# Patient Record
Sex: Male | Born: 1975
Health system: Southern US, Community
[De-identification: ages and names within clinical notes are randomized; demographics above are authoritative.]

## PROBLEM LIST (undated history)

## (undated) DIAGNOSIS — Z973 Presence of spectacles and contact lenses: Secondary | ICD-10-CM

## (undated) DIAGNOSIS — I82409 Acute embolism and thrombosis of unspecified deep veins of unspecified lower extremity: Secondary | ICD-10-CM

## (undated) DIAGNOSIS — I2699 Other pulmonary embolism without acute cor pulmonale: Secondary | ICD-10-CM

## (undated) DIAGNOSIS — I10 Essential (primary) hypertension: Secondary | ICD-10-CM

## (undated) HISTORY — PX: CHOLECYSTECTOMY: SHX55

## (undated) HISTORY — DX: Essential (primary) hypertension: I10

## (undated) HISTORY — DX: Presence of spectacles and contact lenses: Z97.3

---

## 1997-06-27 ENCOUNTER — Emergency Department (HOSPITAL_COMMUNITY): Admission: EM | Admit: 1997-06-27 | Discharge: 1997-06-27 | Payer: Self-pay | Admitting: Emergency Medicine

## 1999-07-13 ENCOUNTER — Emergency Department (HOSPITAL_COMMUNITY): Admission: EM | Admit: 1999-07-13 | Discharge: 1999-07-13 | Payer: Self-pay | Admitting: Internal Medicine

## 2000-01-02 ENCOUNTER — Emergency Department (HOSPITAL_COMMUNITY): Admission: EM | Admit: 2000-01-02 | Discharge: 2000-01-02 | Payer: Self-pay | Admitting: *Deleted

## 2001-03-12 DIAGNOSIS — I10 Essential (primary) hypertension: Secondary | ICD-10-CM

## 2001-03-12 HISTORY — DX: Essential (primary) hypertension: I10

## 2002-03-03 ENCOUNTER — Emergency Department (HOSPITAL_COMMUNITY): Admission: EM | Admit: 2002-03-03 | Discharge: 2002-03-03 | Payer: Self-pay | Admitting: Emergency Medicine

## 2002-03-05 ENCOUNTER — Emergency Department (HOSPITAL_COMMUNITY): Admission: EM | Admit: 2002-03-05 | Discharge: 2002-03-05 | Payer: Self-pay | Admitting: Emergency Medicine

## 2002-03-06 ENCOUNTER — Emergency Department (HOSPITAL_COMMUNITY): Admission: EM | Admit: 2002-03-06 | Discharge: 2002-03-06 | Payer: Self-pay | Admitting: Emergency Medicine

## 2002-03-07 ENCOUNTER — Encounter: Payer: Self-pay | Admitting: Emergency Medicine

## 2002-03-08 ENCOUNTER — Encounter: Payer: Self-pay | Admitting: *Deleted

## 2002-03-08 ENCOUNTER — Inpatient Hospital Stay (HOSPITAL_COMMUNITY): Admission: EM | Admit: 2002-03-08 | Discharge: 2002-03-23 | Payer: Self-pay | Admitting: Emergency Medicine

## 2002-03-10 ENCOUNTER — Encounter (INDEPENDENT_AMBULATORY_CARE_PROVIDER_SITE_OTHER): Payer: Self-pay | Admitting: *Deleted

## 2002-03-11 ENCOUNTER — Encounter: Payer: Self-pay | Admitting: Gastroenterology

## 2002-03-19 ENCOUNTER — Encounter: Payer: Self-pay | Admitting: Internal Medicine

## 2002-03-24 ENCOUNTER — Inpatient Hospital Stay (HOSPITAL_COMMUNITY): Admission: EM | Admit: 2002-03-24 | Discharge: 2002-03-30 | Payer: Self-pay | Admitting: Emergency Medicine

## 2002-03-24 ENCOUNTER — Encounter: Payer: Self-pay | Admitting: Internal Medicine

## 2002-03-26 ENCOUNTER — Encounter: Payer: Self-pay | Admitting: *Deleted

## 2002-04-04 ENCOUNTER — Inpatient Hospital Stay (HOSPITAL_COMMUNITY): Admission: EM | Admit: 2002-04-04 | Discharge: 2002-04-15 | Payer: Self-pay | Admitting: Emergency Medicine

## 2002-04-07 ENCOUNTER — Encounter: Payer: Self-pay | Admitting: *Deleted

## 2002-04-07 ENCOUNTER — Encounter: Payer: Self-pay | Admitting: Internal Medicine

## 2002-04-22 ENCOUNTER — Encounter (HOSPITAL_BASED_OUTPATIENT_CLINIC_OR_DEPARTMENT_OTHER): Admission: RE | Admit: 2002-04-22 | Discharge: 2002-07-21 | Payer: Self-pay | Admitting: Dermatology

## 2002-04-24 ENCOUNTER — Encounter: Payer: Self-pay | Admitting: Internal Medicine

## 2002-04-24 ENCOUNTER — Inpatient Hospital Stay (HOSPITAL_COMMUNITY): Admission: EM | Admit: 2002-04-24 | Discharge: 2002-04-25 | Payer: Self-pay | Admitting: Emergency Medicine

## 2002-07-24 ENCOUNTER — Encounter (HOSPITAL_BASED_OUTPATIENT_CLINIC_OR_DEPARTMENT_OTHER): Admission: RE | Admit: 2002-07-24 | Discharge: 2002-10-22 | Payer: Self-pay | Admitting: Dermatology

## 2002-10-26 ENCOUNTER — Encounter (HOSPITAL_BASED_OUTPATIENT_CLINIC_OR_DEPARTMENT_OTHER): Admission: RE | Admit: 2002-10-26 | Discharge: 2002-11-27 | Payer: Self-pay | Admitting: Dermatology

## 2002-12-01 ENCOUNTER — Emergency Department (HOSPITAL_COMMUNITY): Admission: EM | Admit: 2002-12-01 | Discharge: 2002-12-02 | Payer: Self-pay | Admitting: Emergency Medicine

## 2003-01-30 ENCOUNTER — Emergency Department (HOSPITAL_COMMUNITY): Admission: EM | Admit: 2003-01-30 | Discharge: 2003-01-30 | Payer: Self-pay | Admitting: Emergency Medicine

## 2003-08-13 ENCOUNTER — Emergency Department (HOSPITAL_COMMUNITY): Admission: EM | Admit: 2003-08-13 | Discharge: 2003-08-13 | Payer: Self-pay | Admitting: Emergency Medicine

## 2004-04-07 ENCOUNTER — Emergency Department (HOSPITAL_COMMUNITY): Admission: EM | Admit: 2004-04-07 | Discharge: 2004-04-07 | Payer: Self-pay | Admitting: Emergency Medicine

## 2004-04-16 ENCOUNTER — Emergency Department (HOSPITAL_COMMUNITY): Admission: EM | Admit: 2004-04-16 | Discharge: 2004-04-16 | Payer: Self-pay | Admitting: Emergency Medicine

## 2004-04-17 ENCOUNTER — Emergency Department (HOSPITAL_COMMUNITY): Admission: EM | Admit: 2004-04-17 | Discharge: 2004-04-17 | Payer: Self-pay | Admitting: Emergency Medicine

## 2004-06-05 ENCOUNTER — Emergency Department (HOSPITAL_COMMUNITY): Admission: EM | Admit: 2004-06-05 | Discharge: 2004-06-05 | Payer: Self-pay | Admitting: Emergency Medicine

## 2004-06-08 ENCOUNTER — Ambulatory Visit: Payer: Self-pay | Admitting: Cardiology

## 2004-06-08 ENCOUNTER — Observation Stay (HOSPITAL_COMMUNITY): Admission: EM | Admit: 2004-06-08 | Discharge: 2004-06-09 | Payer: Self-pay | Admitting: Emergency Medicine

## 2004-06-09 ENCOUNTER — Encounter: Payer: Self-pay | Admitting: Cardiology

## 2004-10-23 ENCOUNTER — Emergency Department (HOSPITAL_COMMUNITY): Admission: EM | Admit: 2004-10-23 | Discharge: 2004-10-24 | Payer: Self-pay | Admitting: Emergency Medicine

## 2004-10-24 ENCOUNTER — Ambulatory Visit (HOSPITAL_COMMUNITY): Admission: RE | Admit: 2004-10-24 | Discharge: 2004-10-24 | Payer: Self-pay | Admitting: Emergency Medicine

## 2004-10-25 ENCOUNTER — Ambulatory Visit: Payer: Self-pay | Admitting: Nurse Practitioner

## 2004-10-25 ENCOUNTER — Ambulatory Visit: Payer: Self-pay | Admitting: *Deleted

## 2004-11-22 ENCOUNTER — Ambulatory Visit: Payer: Self-pay | Admitting: Nurse Practitioner

## 2004-12-09 ENCOUNTER — Emergency Department (HOSPITAL_COMMUNITY): Admission: EM | Admit: 2004-12-09 | Discharge: 2004-12-09 | Payer: Self-pay | Admitting: Emergency Medicine

## 2005-01-01 ENCOUNTER — Emergency Department (HOSPITAL_COMMUNITY): Admission: EM | Admit: 2005-01-01 | Discharge: 2005-01-01 | Payer: Self-pay | Admitting: *Deleted

## 2005-01-02 ENCOUNTER — Emergency Department (HOSPITAL_COMMUNITY): Admission: EM | Admit: 2005-01-02 | Discharge: 2005-01-02 | Payer: Self-pay | Admitting: *Deleted

## 2005-07-25 ENCOUNTER — Emergency Department (HOSPITAL_COMMUNITY): Admission: EM | Admit: 2005-07-25 | Discharge: 2005-07-25 | Payer: Self-pay | Admitting: Emergency Medicine

## 2005-09-15 ENCOUNTER — Emergency Department (HOSPITAL_COMMUNITY): Admission: EM | Admit: 2005-09-15 | Discharge: 2005-09-15 | Payer: Self-pay | Admitting: Emergency Medicine

## 2005-12-12 ENCOUNTER — Emergency Department (HOSPITAL_COMMUNITY): Admission: EM | Admit: 2005-12-12 | Discharge: 2005-12-12 | Payer: Self-pay | Admitting: Emergency Medicine

## 2006-01-29 ENCOUNTER — Emergency Department (HOSPITAL_COMMUNITY): Admission: EM | Admit: 2006-01-29 | Discharge: 2006-01-29 | Payer: Self-pay | Admitting: Emergency Medicine

## 2006-08-06 ENCOUNTER — Ambulatory Visit: Payer: Self-pay | Admitting: Nurse Practitioner

## 2006-08-30 ENCOUNTER — Ambulatory Visit: Payer: Self-pay | Admitting: Internal Medicine

## 2006-09-16 ENCOUNTER — Ambulatory Visit: Payer: Self-pay | Admitting: Internal Medicine

## 2006-09-25 ENCOUNTER — Ambulatory Visit: Payer: Self-pay | Admitting: Internal Medicine

## 2006-10-09 ENCOUNTER — Emergency Department (HOSPITAL_COMMUNITY): Admission: EM | Admit: 2006-10-09 | Discharge: 2006-10-09 | Payer: Self-pay | Admitting: Emergency Medicine

## 2006-10-24 ENCOUNTER — Ambulatory Visit: Payer: Self-pay | Admitting: Family Medicine

## 2006-10-31 ENCOUNTER — Ambulatory Visit: Payer: Self-pay | Admitting: Internal Medicine

## 2006-11-19 ENCOUNTER — Ambulatory Visit: Payer: Self-pay | Admitting: Family Medicine

## 2006-11-27 ENCOUNTER — Encounter (INDEPENDENT_AMBULATORY_CARE_PROVIDER_SITE_OTHER): Payer: Self-pay | Admitting: *Deleted

## 2006-12-12 ENCOUNTER — Ambulatory Visit: Payer: Self-pay | Admitting: Internal Medicine

## 2007-01-02 ENCOUNTER — Ambulatory Visit: Payer: Self-pay | Admitting: Internal Medicine

## 2007-01-08 ENCOUNTER — Ambulatory Visit: Payer: Self-pay | Admitting: Family Medicine

## 2007-01-13 ENCOUNTER — Emergency Department (HOSPITAL_COMMUNITY): Admission: EM | Admit: 2007-01-13 | Discharge: 2007-01-13 | Payer: Self-pay | Admitting: Emergency Medicine

## 2007-01-22 ENCOUNTER — Ambulatory Visit: Payer: Self-pay | Admitting: Family Medicine

## 2007-02-04 ENCOUNTER — Emergency Department (HOSPITAL_COMMUNITY): Admission: EM | Admit: 2007-02-04 | Discharge: 2007-02-04 | Payer: Self-pay | Admitting: Emergency Medicine

## 2007-02-28 ENCOUNTER — Ambulatory Visit: Payer: Self-pay | Admitting: Family Medicine

## 2007-06-03 ENCOUNTER — Ambulatory Visit: Payer: Self-pay | Admitting: Internal Medicine

## 2007-06-11 ENCOUNTER — Ambulatory Visit (HOSPITAL_COMMUNITY): Admission: RE | Admit: 2007-06-11 | Discharge: 2007-06-11 | Payer: Self-pay | Admitting: Family Medicine

## 2007-07-04 ENCOUNTER — Ambulatory Visit: Payer: Self-pay | Admitting: Internal Medicine

## 2007-08-14 ENCOUNTER — Encounter (INDEPENDENT_AMBULATORY_CARE_PROVIDER_SITE_OTHER): Payer: Self-pay | Admitting: Family Medicine

## 2007-08-14 ENCOUNTER — Ambulatory Visit: Payer: Self-pay | Admitting: Internal Medicine

## 2007-08-14 LAB — CONVERTED CEMR LAB
Benzodiazepines.: NEGATIVE
Creatinine,U: 251.5 mg/dL
Methadone: NEGATIVE

## 2008-03-20 ENCOUNTER — Emergency Department (HOSPITAL_COMMUNITY): Admission: EM | Admit: 2008-03-20 | Discharge: 2008-03-20 | Payer: Self-pay | Admitting: Emergency Medicine

## 2008-05-05 ENCOUNTER — Ambulatory Visit: Payer: Self-pay | Admitting: Internal Medicine

## 2008-05-05 ENCOUNTER — Encounter (INDEPENDENT_AMBULATORY_CARE_PROVIDER_SITE_OTHER): Payer: Self-pay | Admitting: Internal Medicine

## 2008-05-05 LAB — CONVERTED CEMR LAB
Amphetamine Screen, Ur: NEGATIVE
Benzodiazepines.: NEGATIVE
Cocaine Metabolites: NEGATIVE
Creatinine,U: 145.5 mg/dL
Marijuana Metabolite: POSITIVE — AB
Propoxyphene: NEGATIVE

## 2008-05-12 ENCOUNTER — Emergency Department (HOSPITAL_COMMUNITY): Admission: EM | Admit: 2008-05-12 | Discharge: 2008-05-12 | Payer: Self-pay | Admitting: Emergency Medicine

## 2008-05-20 ENCOUNTER — Encounter (INDEPENDENT_AMBULATORY_CARE_PROVIDER_SITE_OTHER): Payer: Self-pay | Admitting: Internal Medicine

## 2008-05-20 ENCOUNTER — Ambulatory Visit: Payer: Self-pay | Admitting: Internal Medicine

## 2008-05-20 LAB — CONVERTED CEMR LAB
ALT: 14 units/L (ref 0–53)
AST: 12 units/L (ref 0–37)
Calcium: 9 mg/dL (ref 8.4–10.5)
Chloride: 105 meq/L (ref 96–112)
Creatinine, Ser: 1.21 mg/dL (ref 0.40–1.50)
Glucose, Bld: 96 mg/dL (ref 70–99)
Potassium: 3.9 meq/L (ref 3.5–5.3)
Total Bilirubin: 0.6 mg/dL (ref 0.3–1.2)

## 2008-07-09 ENCOUNTER — Ambulatory Visit: Payer: Self-pay | Admitting: Internal Medicine

## 2009-04-21 ENCOUNTER — Ambulatory Visit: Payer: Self-pay | Admitting: Internal Medicine

## 2009-04-21 ENCOUNTER — Inpatient Hospital Stay (HOSPITAL_COMMUNITY): Admission: EM | Admit: 2009-04-21 | Discharge: 2009-04-24 | Payer: Self-pay | Admitting: Emergency Medicine

## 2009-04-22 ENCOUNTER — Encounter (INDEPENDENT_AMBULATORY_CARE_PROVIDER_SITE_OTHER): Payer: Self-pay | Admitting: Internal Medicine

## 2009-07-14 ENCOUNTER — Emergency Department (HOSPITAL_COMMUNITY): Admission: EM | Admit: 2009-07-14 | Discharge: 2009-07-14 | Payer: Self-pay | Admitting: Emergency Medicine

## 2009-11-16 ENCOUNTER — Emergency Department (HOSPITAL_COMMUNITY): Admission: EM | Admit: 2009-11-16 | Discharge: 2009-11-16 | Payer: Self-pay | Admitting: Emergency Medicine

## 2009-11-18 ENCOUNTER — Emergency Department (HOSPITAL_COMMUNITY): Admission: EM | Admit: 2009-11-18 | Discharge: 2009-11-18 | Payer: Self-pay | Admitting: Emergency Medicine

## 2010-05-19 ENCOUNTER — Emergency Department (HOSPITAL_COMMUNITY): Payer: Self-pay

## 2010-05-19 ENCOUNTER — Emergency Department (HOSPITAL_COMMUNITY)
Admission: EM | Admit: 2010-05-19 | Discharge: 2010-05-19 | Disposition: A | Discharge status: Home / Self Care | Payer: Self-pay | Attending: Emergency Medicine | Admitting: Emergency Medicine

## 2010-05-19 DIAGNOSIS — R079 Chest pain, unspecified: Secondary | ICD-10-CM | POA: Insufficient documentation

## 2010-05-19 DIAGNOSIS — R059 Cough, unspecified: Secondary | ICD-10-CM | POA: Insufficient documentation

## 2010-05-19 DIAGNOSIS — R1013 Epigastric pain: Secondary | ICD-10-CM | POA: Insufficient documentation

## 2010-05-19 DIAGNOSIS — I10 Essential (primary) hypertension: Secondary | ICD-10-CM | POA: Insufficient documentation

## 2010-05-19 DIAGNOSIS — R05 Cough: Secondary | ICD-10-CM | POA: Insufficient documentation

## 2010-05-19 DIAGNOSIS — K7689 Other specified diseases of liver: Secondary | ICD-10-CM | POA: Insufficient documentation

## 2010-05-19 LAB — CBC
Hemoglobin: 16.8 g/dL (ref 13.0–17.0)
MCH: 30.9 pg (ref 26.0–34.0)
MCHC: 36.1 g/dL — ABNORMAL HIGH (ref 30.0–36.0)
MCV: 85.6 fL (ref 78.0–100.0)
RBC: 5.43 MIL/uL (ref 4.22–5.81)

## 2010-05-19 LAB — HEPATIC FUNCTION PANEL
Bilirubin, Direct: 0.2 mg/dL (ref 0.0–0.3)
Indirect Bilirubin: 0.5 mg/dL (ref 0.3–0.9)

## 2010-05-19 LAB — DIFFERENTIAL
Basophils Relative: 1 % (ref 0–1)
Eosinophils Absolute: 0.1 10*3/uL (ref 0.0–0.7)
Lymphocytes Relative: 49 % — ABNORMAL HIGH (ref 12–46)
Monocytes Absolute: 0.5 10*3/uL (ref 0.1–1.0)
Monocytes Relative: 6 % (ref 3–12)

## 2010-05-19 LAB — POCT I-STAT, CHEM 8
Chloride: 105 mEq/L (ref 96–112)
Creatinine, Ser: 1 mg/dL (ref 0.4–1.5)
HCT: 51 % (ref 39.0–52.0)
Sodium: 144 mEq/L (ref 135–145)

## 2010-05-19 LAB — LIPASE, BLOOD: Lipase: 36 U/L (ref 11–59)

## 2010-05-19 MED ORDER — IOHEXOL 300 MG/ML  SOLN
125.0000 mL | Freq: Once | INTRAMUSCULAR | Status: AC | PRN
  Administered 2010-05-19: 125 mL via INTRAVENOUS

## 2010-05-25 LAB — DIFFERENTIAL
Basophils Absolute: 0 10*3/uL (ref 0.0–0.1)
Eosinophils Absolute: 0.1 10*3/uL (ref 0.0–0.7)
Eosinophils Relative: 2 % (ref 0–5)
Lymphocytes Relative: 12 % (ref 12–46)
Neutrophils Relative %: 80 % — ABNORMAL HIGH (ref 43–77)

## 2010-05-25 LAB — COMPREHENSIVE METABOLIC PANEL
AST: 95 U/L — ABNORMAL HIGH (ref 0–37)
BUN: 7 mg/dL (ref 6–23)
CO2: 30 mEq/L (ref 19–32)
Creatinine, Ser: 0.98 mg/dL (ref 0.4–1.5)
GFR calc Af Amer: >60 mL/min (ref >60)
Glucose, Bld: 106 mg/dL — ABNORMAL HIGH (ref 70–99)
Potassium: 3.6 mEq/L (ref 3.5–5.1)
Sodium: 141 mEq/L (ref 135–145)
Total Bilirubin: 1.2 mg/dL (ref 0.3–1.2)

## 2010-05-25 LAB — CBC
MCHC: 34.7 g/dL (ref 30.0–36.0)
RDW: 14.3 % (ref 11.5–15.5)

## 2010-05-25 LAB — URINALYSIS, ROUTINE W REFLEX MICROSCOPIC
Glucose, UA: NEGATIVE mg/dL
Hgb urine dipstick: NEGATIVE
Nitrite: NEGATIVE
Protein, ur: NEGATIVE mg/dL
Specific Gravity, Urine: 1.017 (ref 1.005–1.030)
pH: 7.5 (ref 5.0–8.0)

## 2010-05-31 LAB — BASIC METABOLIC PANEL
BUN: 11 mg/dL (ref 6–23)
BUN: 11 mg/dL (ref 6–23)
CO2: 26 mEq/L (ref 19–32)
Calcium: 8.6 mg/dL (ref 8.4–10.5)
Chloride: 102 mEq/L (ref 96–112)
Chloride: 103 mEq/L (ref 96–112)
Creatinine, Ser: 1.29 mg/dL (ref 0.4–1.5)
GFR calc Af Amer: >60 mL/min (ref >60)
GFR calc non Af Amer: >60 mL/min (ref >60)
Glucose, Bld: 88 mg/dL (ref 70–99)
Potassium: 2.8 mEq/L — ABNORMAL LOW (ref 3.5–5.1)

## 2010-05-31 LAB — CULTURE, RESPIRATORY W GRAM STAIN

## 2010-05-31 LAB — COMPREHENSIVE METABOLIC PANEL
ALT: 18 U/L (ref 0–53)
AST: 21 U/L (ref 0–37)
Alkaline Phosphatase: 68 U/L (ref 39–117)
Calcium: 9.1 mg/dL (ref 8.4–10.5)
GFR calc Af Amer: >60 mL/min (ref >60)
Glucose, Bld: 109 mg/dL — ABNORMAL HIGH (ref 70–99)
Potassium: 3.5 mEq/L (ref 3.5–5.1)
Sodium: 139 mEq/L (ref 135–145)
Total Protein: 7.9 g/dL (ref 6.0–8.3)

## 2010-05-31 LAB — URINE CULTURE
Culture: NO GROWTH
Special Requests: NEGATIVE

## 2010-05-31 LAB — POCT CARDIAC MARKERS
CKMB, poc: <1 ng/mL — ABNORMAL LOW (ref 1.0–8.0)
Troponin i, poc: <0.05 ng/mL (ref 0.00–0.09)

## 2010-05-31 LAB — DIFFERENTIAL
Basophils Relative: 1 % (ref 0–1)
Eosinophils Absolute: 0 10*3/uL (ref 0.0–0.7)
Eosinophils Absolute: 0.1 10*3/uL (ref 0.0–0.7)
Eosinophils Relative: 0 % (ref 0–5)
Eosinophils Relative: 1 % (ref 0–5)
Lymphocytes Relative: 7 % — ABNORMAL LOW (ref 12–46)
Lymphs Abs: 0.6 10*3/uL — ABNORMAL LOW (ref 0.7–4.0)
Lymphs Abs: 2.2 10*3/uL (ref 0.7–4.0)
Monocytes Absolute: 0.7 10*3/uL (ref 0.1–1.0)
Monocytes Relative: 7 % (ref 3–12)
Monocytes Relative: 8 % (ref 3–12)
Neutrophils Relative %: 65 % (ref 43–77)
Neutrophils Relative %: 86 % — ABNORMAL HIGH (ref 43–77)

## 2010-05-31 LAB — URINALYSIS, MICROSCOPIC ONLY
Bilirubin Urine: NEGATIVE
Ketones, ur: NEGATIVE mg/dL
Leukocytes, UA: NEGATIVE
Nitrite: NEGATIVE
Urobilinogen, UA: 0.2 mg/dL (ref 0.0–1.0)

## 2010-05-31 LAB — CBC
HCT: 45 % (ref 39.0–52.0)
HCT: 47.5 % (ref 39.0–52.0)
MCHC: 34.7 g/dL (ref 30.0–36.0)
MCV: 88.1 fL (ref 78.0–100.0)
Platelets: 175 10*3/uL (ref 150–400)
Platelets: 228 10*3/uL (ref 150–400)
RBC: 5.11 MIL/uL (ref 4.22–5.81)
RBC: 5.36 MIL/uL (ref 4.22–5.81)
WBC: 8.6 10*3/uL (ref 4.0–10.5)
WBC: 8.8 10*3/uL (ref 4.0–10.5)

## 2010-05-31 LAB — RAPID URINE DRUG SCREEN, HOSP PERFORMED
Barbiturates: NOT DETECTED
Benzodiazepines: NOT DETECTED
Cocaine: NOT DETECTED
Opiates: NOT DETECTED

## 2010-05-31 LAB — EXPECTORATED SPUTUM ASSESSMENT W GRAM STAIN, RFLX TO RESP C

## 2010-05-31 LAB — TROPONIN I: Troponin I: 0.02 ng/mL (ref 0.00–0.06)

## 2010-05-31 LAB — LIPASE, BLOOD
Lipase: 28 U/L (ref 11–59)
Lipase: 34 U/L (ref 11–59)

## 2010-06-07 ENCOUNTER — Emergency Department (HOSPITAL_COMMUNITY): Payer: Self-pay

## 2010-06-07 ENCOUNTER — Inpatient Hospital Stay (HOSPITAL_COMMUNITY)
Admission: EM | Admit: 2010-06-07 | Discharge: 2010-06-11 | DRG: 418 | Disposition: A | Discharge status: Home / Self Care | Payer: Self-pay | Attending: Emergency Medicine | Admitting: Emergency Medicine

## 2010-06-07 DIAGNOSIS — K8020 Calculus of gallbladder without cholecystitis without obstruction: Secondary | ICD-10-CM

## 2010-06-07 DIAGNOSIS — F17200 Nicotine dependence, unspecified, uncomplicated: Secondary | ICD-10-CM | POA: Diagnosis present

## 2010-06-07 DIAGNOSIS — R7402 Elevation of levels of lactic acid dehydrogenase (LDH): Secondary | ICD-10-CM | POA: Diagnosis present

## 2010-06-07 DIAGNOSIS — K8062 Calculus of gallbladder and bile duct with acute cholecystitis without obstruction: Principal | ICD-10-CM | POA: Diagnosis present

## 2010-06-07 DIAGNOSIS — I10 Essential (primary) hypertension: Secondary | ICD-10-CM | POA: Diagnosis present

## 2010-06-07 DIAGNOSIS — R7401 Elevation of levels of liver transaminase levels: Secondary | ICD-10-CM | POA: Diagnosis present

## 2010-06-07 DIAGNOSIS — R17 Unspecified jaundice: Secondary | ICD-10-CM | POA: Diagnosis present

## 2010-06-07 LAB — COMPREHENSIVE METABOLIC PANEL
BUN: 8 mg/dL (ref 6–23)
Calcium: 9.5 mg/dL (ref 8.4–10.5)
Glucose, Bld: 100 mg/dL — ABNORMAL HIGH (ref 70–99)
Sodium: 140 mEq/L (ref 135–145)
Total Protein: 8 g/dL (ref 6.0–8.3)

## 2010-06-07 LAB — DIFFERENTIAL
Basophils Absolute: 0 10*3/uL (ref 0.0–0.1)
Basophils Relative: 0 % (ref 0–1)
Eosinophils Absolute: 0.1 10*3/uL (ref 0.0–0.7)
Eosinophils Relative: 1 % (ref 0–5)
Lymphs Abs: 2.9 10*3/uL (ref 0.7–4.0)

## 2010-06-07 LAB — URINALYSIS, ROUTINE W REFLEX MICROSCOPIC
Bilirubin Urine: NEGATIVE
Ketones, ur: NEGATIVE mg/dL
Nitrite: NEGATIVE
Specific Gravity, Urine: 1.017 (ref 1.005–1.030)
Urobilinogen, UA: 1 mg/dL (ref 0.0–1.0)

## 2010-06-07 LAB — CBC
MCV: 85.3 fL (ref 78.0–100.0)
Platelets: 251 10*3/uL (ref 150–400)
RDW: 13.2 % (ref 11.5–15.5)
WBC: 9.7 10*3/uL (ref 4.0–10.5)

## 2010-06-07 LAB — LIPASE, BLOOD: Lipase: 32 U/L (ref 11–59)

## 2010-06-08 LAB — CBC
HCT: 46.9 % (ref 39.0–52.0)
Hemoglobin: 16.4 g/dL (ref 13.0–17.0)
MCH: 30 pg (ref 26.0–34.0)
MCHC: 35 g/dL (ref 30.0–36.0)

## 2010-06-08 LAB — COMPREHENSIVE METABOLIC PANEL
AST: 185 U/L — ABNORMAL HIGH (ref 0–37)
BUN: 5 mg/dL — ABNORMAL LOW (ref 6–23)
CO2: 30 mEq/L (ref 19–32)
Calcium: 8.8 mg/dL (ref 8.4–10.5)
Chloride: 101 mEq/L (ref 96–112)
Creatinine, Ser: 1.1 mg/dL (ref 0.4–1.5)
GFR calc Af Amer: >60 mL/min (ref >60)
GFR calc non Af Amer: >60 mL/min (ref >60)
Glucose, Bld: 107 mg/dL — ABNORMAL HIGH (ref 70–99)
Total Bilirubin: 2.7 mg/dL — ABNORMAL HIGH (ref 0.3–1.2)

## 2010-06-08 LAB — RAPID URINE DRUG SCREEN, HOSP PERFORMED
Barbiturates: NOT DETECTED
Benzodiazepines: NOT DETECTED
Cocaine: NOT DETECTED

## 2010-06-09 ENCOUNTER — Other Ambulatory Visit: Payer: Self-pay | Admitting: Surgery

## 2010-06-09 ENCOUNTER — Inpatient Hospital Stay (HOSPITAL_COMMUNITY): Payer: Self-pay

## 2010-06-09 LAB — COMPREHENSIVE METABOLIC PANEL
ALT: 201 U/L — ABNORMAL HIGH (ref 0–53)
AST: 64 U/L — ABNORMAL HIGH (ref 0–37)
Alkaline Phosphatase: 98 U/L (ref 39–117)
CO2: 28 mEq/L (ref 19–32)
Calcium: 9.1 mg/dL (ref 8.4–10.5)
Chloride: 103 mEq/L (ref 96–112)
GFR calc Af Amer: >60 mL/min (ref >60)
GFR calc non Af Amer: >60 mL/min (ref >60)
Glucose, Bld: 114 mg/dL — ABNORMAL HIGH (ref 70–99)
Potassium: 3.5 mEq/L (ref 3.5–5.1)
Sodium: 138 mEq/L (ref 135–145)

## 2010-06-09 LAB — CBC
HCT: 49.5 % (ref 39.0–52.0)
Hemoglobin: 17.4 g/dL — ABNORMAL HIGH (ref 13.0–17.0)
MCHC: 35.2 g/dL (ref 30.0–36.0)
RBC: 5.83 MIL/uL — ABNORMAL HIGH (ref 4.22–5.81)
WBC: 7.3 10*3/uL (ref 4.0–10.5)

## 2010-06-10 LAB — COMPREHENSIVE METABOLIC PANEL
CO2: 30 mEq/L (ref 19–32)
Calcium: 9 mg/dL (ref 8.4–10.5)
Creatinine, Ser: 1.11 mg/dL (ref 0.4–1.5)
GFR calc Af Amer: >60 mL/min (ref >60)
GFR calc non Af Amer: >60 mL/min (ref >60)
Glucose, Bld: 110 mg/dL — ABNORMAL HIGH (ref 70–99)
Total Protein: 6.9 g/dL (ref 6.0–8.3)

## 2010-06-10 LAB — CBC
HCT: 47.8 % (ref 39.0–52.0)
MCHC: 34.9 g/dL (ref 30.0–36.0)
MCV: 86 fL (ref 78.0–100.0)
Platelets: 231 10*3/uL (ref 150–400)
RDW: 13.5 % (ref 11.5–15.5)
WBC: 11.5 10*3/uL — ABNORMAL HIGH (ref 4.0–10.5)

## 2010-06-13 NOTE — Consult Note (Signed)
  [name], [name]               ACCOUNT NO.:  [id number]  MEDICAL RECORD NO.:  [id number]           PATIENT TYPE:  I  LOCATION:  1508                         FACILITY:  [facility]  PHYSICIAN:  Graylin Shiver, M.D.   DATE OF BIRTH:  1975/06/11  DATE OF CONSULTATION:  06/09/2010 DATE OF DISCHARGE:                                CONSULTATION   REASON FOR CONSULTATION:  The patient is a 35 year old black male who was admitted to the hospital with abdominal pain.  He was found to have cholecystitis and had a laparoscopic cholecystectomy today for acute cholecystitis and he also had intraoperative cholangiograms.  Dr. Gerrit [name] was concerned about the possibility of a distal common bile duct stone. However, the radiologist did not call this and in my review of the intraoperative cholangiogram, I do not definitely see a stone in the bile duct, but I am not sure if there might be some filling abnormality in the distal common bile duct.  The patient is doing well today postoperatively.  PAST MEDICAL HISTORY: 1. Hypertension. 2. Tobacco abuse. 3. Lumbar disk disease due to work injury.  MEDICATIONS PRIOR TO ADMISSION:  Hydrochlorothiazide, lisinopril, Prilosec, OTC ibuprofen.  ALLERGIES:  PENICILLIN.  SOCIAL HISTORY: 1. Smokes cigar a day. 2. Smokes some marijuana. 3. Denies alcohol.  REVIEW OF SYSTEMS:  Not complaining of any chest pain or shortness of breath.  PHYSICAL EXAMINATION:  GENERAL:  He does not appear in any acute distress. HEENT:  He is nonicteric. HEART:  Regular rhythm.  No murmurs. LUNGS:  Clear. ABDOMEN:  Soft, but it is postop, and he does have some tenderness postoperatively which was today.  Liver enzymes today showed bilirubin of 1.2, alkaline phosphatase 98, AST 64, and ALT 201.  His liver enzymes yesterday were higher with a bilirubin of 2.7, alkaline phosphatase 94, AST 185, and ALT 274.  IMPRESSION: 1. Status post laparoscopic cholecystectomy for  cholecystitis. 2. Questionable distal common bile duct stone, although in reviewing     the cholangiogram I am not sure about this, and the radiologist did     not call this.  PLAN:  I would recommend following his liver enzymes and make further decisions based on serial LFTs and clinical followup.  If his LFTs do remain high, we could certainly consider doing an endoscopic ultrasound or an MRCP to see if this shows a stone in the CBD, and if it did, this would lead [place] to do an ERCP with sphincterotomy and stone extraction.          ______________________________ Graylin Shiver, M.D.     SFG/MEDQ  D:  06/09/2010  T:  06/10/2010  Job:  [id number]  cc:   Velora Heckler, MD [address] [place] [address]  Electronically Signed by Herbert Moors MD on 06/13/2010 04:39:42 PM

## 2010-06-13 NOTE — Consult Note (Signed)
[name], [name]               ACCOUNT NO.:  [id number]  MEDICAL RECORD NO.:  [id number]           PATIENT TYPE:  E  LOCATION:  WLED                         FACILITY:  [facility]  PHYSICIAN:  [name], M.D. DATE OF BIRTH:  28-Oct-1975  DATE OF CONSULTATION: DATE OF DISCHARGE:                                CONSULTATION   PRIMARY CARE PHYSICIAN:  [facility]  REASON FOR CONSULTATION:  Uncontrolled hypertension.  HISTORY OF PRESENT ILLNESS:  This is a 35 year old African American gentleman who was diagnosed with hypertension at the age 73 for a bout of severe allergies and since then has been maintained on oral antihypertensives but who reports that his blood pressure has never been well controlled and does admit that for various reasons he is episodically noncompliant with his medications.  He has not seen his primary care physician for the past 5 months and has therefore not had refills of his Lopressor and clonidine or HCTZ.  He did visit the emergency room in the interim for management of blood pressure and other issues and he was started on lisinopril and HCTZ.  The patient has been visiting the emergency room episodically since September of last year for abdominal pain.  He has had CT scans and ultrasounds of the abdomen without any definitive diagnosis being made. He has been treated for gastritis with proton pump inhibitors and has had some improvement.  However, today, the patient had sudden onset of severe epigastric and central abdominal pain which was steady nonradiating, not associated with nausea or vomiting and he presents to the emergency room.  Another ultrasound was done which revealed cholelithiasis and dilated biliary ducts and he was accepted for admission by the Surgical Service with diagnosis of biliary colic. However, his blood pressures were found to be markedly elevated up to 196/144 and remains elevated despite pain control and the  hospitalist service was called to assist with management.  The patient reports that on his current medical regimen he checked his blood pressure last week and the blood pressure was 140/97.  He reports that his diastolic rarely goes below 90 and the systolic rarely goes below 40.  He has been having no fever, cough, or cold.  He does have episodic headaches when he feels his blood pressure is high.  He does not suffer chest pains or dyspnea on exertion.  He does not do much in the form of exercise.  He does play basketball episodically.  He continues to smoke cigars.  He has cut down from 4 a day to 1 per day.  He smokes marijuana episodically typically 1 or 2 per week, but he is cutting it down.  He denies alcohol or illicit drug use.  PAST MEDICAL HISTORY: 1. Hypertension. 2. Allergic disorders. 3. Tobacco abuse. 4. Lumbar disk disease due to work injury.  MEDICATIONS: 1. HCTZ 25 mg daily. 2. Lisinopril 10 mg daily. 3. Prilosec OTC episodically 1 daily. 4. Ibuprofen 200 to 800 mg every 8 hours as needed but typically only     takes once or twice per week for lower back pain.  ALLERGIES:  PENICILLIN which causes  a rash.  SOCIAL HISTORY:  Smokes 1 cigar per day.  Smokes 1 or 2 marijuana cigarettes per week.  Denies alcohol or illicit drug use.  He is currently attending school as an Dentist.  FAMILY HISTORY:  Significant for mother who is living with metastatic breast cancer, father who died at age 60 from a ruptured brain aneurysm. He denies any other medical history of cardiovascular disease.  REVIEW OF SYSTEMS:  Other than noted above unremarkable.  PHYSICAL EXAMINATION:  GENERAL:  Young African American gentleman lying in the stretcher. VITAL SIGNS:  He gives his height as 6 feet 2 inches and his weight is 215 pounds.  His temperature is 97.8.  His pulse is 75, respirations 18. His blood pressure is currently 166/120, it has been as high as 196/144. His  pulse has been as high as 84.  He is saturating at 96% on room air. HEENT:  His pupils are round and equal.  Mucous membranes pink, anicteric.  He does have some missing teeth at the top front and some carious teeth. NECK:  No cervical lymphadenopathy or thyromegaly.  No carotid bruit. No jugular venous distention. CHEST:  Clear to auscultation bilaterally. CARDIOVASCULAR:  Regular rhythm.  No murmur. ABDOMEN:  Obese, soft, surprisingly nontender.  No masses. EXTREMITIES:  Without edema.  Has 2+ dorsalis pedis pulses bilaterally. CENTRAL NERVOUS SYSTEM:  Cranial nerves II-XII are grossly intact.  He has no focal lateralizing signs.  LABORATORY DATA:  His white count is 9.7, hemoglobin 17.3, platelets 251.  He has a normal differential.  His sodium is 140, potassium 3.5, chloride 100, CO2 31, glucose 100, BUN 8, creatinine 1.0.  His total bilirubin is elevated to 1.5.  His AST is elevated to 157, ALT elevated to 108, total protein 8, albumin 3.9, calcium 9.5.  His lipase is normal at 32.  Abdominal ultrasound reveals cholelithiasis.  No evidence of acute cholecystitis.  His common bile duct is felt to be enlarged compared to previous studies.  ASSESSMENT:1. Hypertensive urgency. 2. Questionable gastroesophageal reflux disease. 3. Cholelithiasis with abnormal liver function. 4. Abdominal pain, probably biliary colic.  PLAN: 1. Because of this gentleman's history of having the blood pressure     moderately well controlled on low doses of medication, we will give     him low-dose Lopressor with p.r.n. intravenous Lopressor and p.r.n.     hydralazine parameters and will advise adequate pain control. 2. Because he may possibly be being prepared for surgery, we will hold     his HCTZ and will start him on D5 half normal saline with     potassium. 3. We will check his EKG. 4. We do note that his echo done 1 year ago showed grade 2 diastolic     dysfunction which does give him the  additional diagnosis of     diastolic heart failure.     [name], M.D.     LC/MEDQ  D:  06/07/2010  T:  06/07/2010  Job:  [id number]  cc:   Thomas A. Cornett, M.D. [address] [place] [address]  Melvern Banker Fax: [phone]  Electronically Signed by [name] M.D. on 06/13/2010 01:10:07 AM

## 2010-06-22 NOTE — Consult Note (Signed)
[name], [name]               ACCOUNT NO.:  [id number]  MEDICAL RECORD NO.:  [id number]          PATIENT TYPE:  LOCATION:                                 FACILITY:  PHYSICIAN:  Rayson Rando A. Yaslene Lindamood, M.D.     DATE OF BIRTH:  DATE OF CONSULTATION: DATE OF DISCHARGE:                                CONSULTATION   REFERRING PHYSICIAN:  Trudi Ida. Denton Lank, MD  REASON FOR CONSULTATION:  Epigastric pain, history of gallstones, and dilated common bile duct.  HISTORY OF PRESENT ILLNESS:  The patient is a pleasant 35 year old male with a 1-day history of epigastric pain.  The pain is severe in nature, probably 10/10, which brought him to the emergency room.  Radiation to his back noted.  Mild radiation to his right upper quadrant, also some radiation up towards his sternum.  He was seen here in the emergency room at [facility] back on May 19, 2010, had a CT scan which showed gallstones and a dilated common duct.  He was sent out to get worked up but has returned with similar symptoms.  No nausea or vomiting.  His pain had been made better with narcotics.  The patient required significant Dilaudid to get his pain under control.  I was asked to see him at the request of Dr. Denton Lank.  Denies any blood in his stool or any jaundice.  PAST MEDICAL HISTORY:  Hypertension.  PAST SURGICAL HISTORY:  No abdominal surgery.  FAMILY HISTORY:  No history of gallstones.  History of hypertension.  SOCIAL HISTORY:  He does smoke a pack cigarettes a day.  Does not drink alcohol.  Denies drug use.  Medications include lisinopril/hydrochlorothiazide, metoprolol dose is unknown.  He used to be on clonidine as well for his blood pressure control.  ALLERGIES:  Penicillin.  REVIEW OF SYSTEMS:  As above, otherwise negative x15 points.  PHYSICAL EXAMINATION:  VITAL SIGNS:  Temperature 97, pulse 74, blood pressure 196/144, sats 97%. GENERAL APPEARANCE:  Pleasant male in no apparent distress. HEENT:   Extraocular movements are intact.  No jaundice.  Oropharynx moist. NECK:  Supple, nontender.  Trachea midline. PULMONARY:  Lung sounds are clear.  Chest wall excursion normal.  Work of breathing normal. CARDIOVASCULAR:  Regular rate and rhythm without rub, murmur, or gallop. EXTREMITIES:  Warm, well perfused, normal pulses. ABDOMEN:  Tender over his epigastrium and right upper quadrant which is mild currently.  No Murphy sign.  No hernia. EXTREMITIES:  No clubbing, no cyanosis, no edema. MUSCULOSKELETAL:  Muscle tone is normal.  Range of motion normal.  DIAGNOSTIC STUDIES:  I reviewed his ultrasound from today and CT scan from May 19, 2010, which shows multiple gallstones in the gallbladder. Gallbladder thickness 3 mm, common duct is 6 mm by ultrasound criteria, a centimeter by CT criteria even though this was almost 3 weeks ago.  No free fluid.  No signs of thickened gallbladder wall.  He has a white count of 9700 without left shift.  Hemoglobin is 17, a platelet count of 251,000, SGOT 157, SGPT 108, total bilirubin is 1.5.  Sodium 140, potassium 3.5, chloride 100, CO2  31, BUN 8, creatinine 1, glucose 100, lipase normal at 32.  Urinalysis is negative.  IMPRESSION:  Symptomatic cholelithiasis with questionable common bile duct stone.  PLAN:  Admit for IV fluids.  Clear liquids and n.p.o. after midnight and I placed him on Cipro 400 mg IV b.i.d., and he will need a laparoscopic cholecystectomy tomorrow for symptomatic cholelithiasis with question common duct stones with intraoperative cholangiogram.  I have discussed all this with the patient.  He understands the plan and agrees to proceed.     Amreen Raczkowski A. Hjalmer Iovino, M.D.     TAC/MEDQ  D:  06/07/2010  T:  06/08/2010  Job:  [id number]  Electronically Signed by Harriette Bouillon M.D. on 06/22/2010 08:44:56 PM

## 2010-06-30 NOTE — Op Note (Signed)
NAMEMARGUES, FILIPPINI               ACCOUNT NO.:  [id number]  MEDICAL RECORD NO.:  [id number]           PATIENT TYPE:  I  LOCATION:  1508                         FACILITY:  [facility]  PHYSICIAN:  Velora Heckler, MD      DATE OF BIRTH:  12-07-1975  DATE OF PROCEDURE:  06/09/2010 DATE OF DISCHARGE:                              OPERATIVE REPORT   PREOPERATIVE DIAGNOSES:  Acute cholecystitis, cholelithiasis.  POSTOPERATIVE DIAGNOSES:  Acute cholecystitis, cholelithiasis, choledocholithiasis.  PROCEDURE:  Laparoscopic cholecystectomy with intraoperative cholangiography.  SURGEON:  Velora Heckler, MD  ANESTHESIA:  General per Dr. Eilene Ghazi.  ESTIMATED BLOOD LOSS:  Minimal.  PREPARATION:  ChloraPrep.  COMPLICATIONS:  None.  INDICATIONS:  The patient is a 35 year old black male admitted to the Medical Service with abdominal pain and hypertension.  Evaluation included abdominal ultrasound showing gallstones.  The liver function tests were abnormal with elevated total bilirubin and transaminases. The patient now comes to surgery for cholecystectomy with cholangiography.  DESCRIPTION OF PROCEDURE:  Procedure was done in OR #11 at the [facility].  The patient was brought to the operating room, placed in supine position on the operating room table.  Following administration of general anesthesia, the patient was positioned and then prepped and draped in usual strict aseptic fashion.  After ascertaining that an adequate level of anesthesia had been achieved, an infraumbilical incision was made a #15 blade.  Dissection was carried down to the fascia.  Fascia was incised in the midline and the peritoneal cavity was entered cautiously.  A 0 Vicryl pursestring suture was placed in the fascia.  An assigned cannula was introduced under direct vision and secured with a pursestring suture.  Abdomen was insufflated with carbon dioxide.  Laparoscope was introduced and  the abdomen explored.  Operative ports were placed along the right costal margin in the midline, midclavicular line, and anterior axillary line. Fundus of the gallbladder was grasped and retracted cephalad. Gallbladder was tense, distended, edematous consistent with acute cholecystitis.  Dissection was begun at the neck of the gallbladder. Peritoneum was incised.  Cystic duct and cystic artery were dissected out.  The cystic artery was clipped proximally and distally and divided. Clip was placed at the neck of the gallbladder to occlude the cystic duct.  Cystic duct was incised with the shears.  The bile under pressure emanated from the cystic duct with a small amount of debris.  A Cook cholangiography catheter was introduced through a stab wound in the right upper quadrant.  It was inserted into the cystic duct and secured with a Ligaclip.  Using C-arm fluoroscopy, real time cholangiography was performed.  There was rapid filling of a dilated biliary tree.  There was reflux of contrast into the right and left hepatic ductal systems. There was also reflux of contrast into what appeared to be a dilated pancreatic duct.  There appeared to be a filling defect at the junction of the pancreatic duct and the common bile duct just before the ampulla. This was partially occlusive as some contrast did reach the duodenum.  Clip was withdrawn and Cook catheter was  removed from the peritoneal cavity.  Cystic duct was triply clipped and divided.  The posterior branch of the cystic artery was dissected out, doubly clipped, and divided with the electrocautery.  Gallbladder was then excised from the gallbladder bed using the hook electrocautery for hemostasis. Gallbladder was completely excised from the gallbladder bed and placed into an EndoCatch bag.  It was withdrawn through the umbilical port without difficulty.  It was submitted to pathology for review.  Right upper quadrant was copiously  irrigated with 1 L of normal saline which was evacuated.  Good hemostasis was noted.  Pneumoperitoneum was released.  Ports were removed under direct vision and good hemostasis was noted at all port sites.  A 0 Vicryl pursestring suture was tied securely at the umbilicus.  Wounds were anesthetized with local anesthetic.  Skin incisions were closed with interrupted 4-0 Monocryl subcuticular sutures.  Wounds were washed and dried and Benzoin and Steri-Strips were applied.  Sterile dressings were applied.  The patient was awakened from anesthesia and brought to the recovery room.  The patient tolerated the procedure well.     Velora Heckler, MD     TMG/MEDQ  D:  06/09/2010  T:  06/09/2010  Job:  [id number]  cc:   Melvern Banker Fax: [phone]  Vania Rea, M.D.  Electronically Signed by Darnell Level MD on 06/30/2010 06:54:35 AM

## 2010-07-08 ENCOUNTER — Encounter (INDEPENDENT_AMBULATORY_CARE_PROVIDER_SITE_OTHER): Payer: Self-pay | Admitting: Surgery

## 2010-07-15 NOTE — Discharge Summary (Signed)
  [name], [name]               ACCOUNT NO.:  [id number]  MEDICAL RECORD NO.:  [id number]           PATIENT TYPE:  I  LOCATION:  1508                         [facility]ACILITY:  [facility]  PHYSICIAN:  Velora Heckler, MD      DATE OF BIRTH:  1975/06/11  DATE OF ADMISSION:  06/07/2010 DATE OF DISCHARGE:  06/11/2010                              DISCHARGE SUMMARY   REASON FOR ADMISSION:  Abdominal pain, hypertension.  BRIEF HISTORY:  The patient is a 35 year old black male admitted to the medical service at [facility] with abdominal pain and hypertension.  The patient was seen by Dr. Harriette Bouillon. Abdominal ultrasound demonstrated multiple gallstones.  Liver function tests showed an elevated total bilirubin and transaminases.  The patient was seen by General Surgery and prepared for the operating room.  HOSPITAL COURSE:  The patient was taken to the operating room on June 09, 2010.  He underwent laparoscopic cholecystectomy with intraoperative cholangiography.  On real time cholangiography, there appeared to be a dilated biliary tree.  There was a filling defect at the junction of the pancreatic duct and common bile duct just proximal to the ampulla.  This was partially occlusive as some contrast did reach the duodenum.  Postoperatively, the patient did well.  He was seen in consultation by Dr. Herbert Moors from Gastroenterology and Dr. Charlott Rakes from the same practice.  Postoperatively, the patient's liver function test improved to the point that Gastroenterology did not want to proceed with ERCP and wanted to continue to observe the patient.  Therefore, the patient was prepared and discharged home on June 11, 2010.  DISCHARGE/PLAN:  The patient is discharged home on June 11, 2010 in good condition, tolerating a regular diet, and ambulating independently.  The patient will be seen back at my office at [facility] in 2- 3 weeks.  The patient will also  follow up with the [facility] and the time to be determined.  FINAL DIAGNOSIS:  Chronic cholecystitis and cholelithiasis, choledocholithiasis.  CONDITION AT DISCHARGE:  Improved.     Velora Heckler, MD     TMG/MEDQ  D:  07/11/2010  T:  07/11/2010  Job:  [id number]  cc:   Graylin Shiver, M.D. Fax: [phone]  Electronically Signed by Darnell Level MD on 07/15/2010 12:38:29 PM

## 2010-07-28 NOTE — Discharge Summary (Signed)
[name], [name]                         ACCOUNT NO.:  [id number]   MEDICAL RECORD NO.:  [id number]                   PATIENT TYPE:  INP   LOCATION:  5706                                 FACILITY:  [facility]   PHYSICIAN:  [name]. Polite, M.D.              DATE OF BIRTH:  1976/02/10   DATE OF ADMISSION:  03/24/2002  DATE OF DISCHARGE:  03/30/2002                                 DISCHARGE SUMMARY   DISCHARGE DIAGNOSES:  1. Bilateral cellulitis of his feet with left greater than right.  2. Drug reaction with maculopapular rash and blisters to both feet.  3. Oral thrush.  4. Hypertension.   DISCHARGE MEDICATIONS:  1. Tequin 400 mg daily for eight more days.  2. Hydrochlorothiazide 25 mg daily.  3. Cardizem CD 360 mg daily.  4. Lopressor 25 mg every eight hours.  5. Percocet 5/325 two tablets every four hours as needed for pain.  6. Benadryl 25 mg every six hours as needed for itching.  7. Prednisone 30 mg daily for two days, then 20 mg daily for two days, then     10 mg daily for two days, then stop.   CONSULTATIONS:  None.   PROCEDURES AND STUDIES:  1. He had an MRI of his bilateral lower extremities on March 26, 2002.     MRI of the right foot demonstrates findings compatible with mild     cellulitis.  There is no evidence of soft tissue abscess or     osteomyelitis.  MRI of the left foot demonstrates diffuse changes     compatible with cellulitis more extensive than that demonstrated on the     right.  In addition, there is evidence for involvement on the musculature     in the plantar aspect of the forefoot compatible with myositis but no     soft tissue abscess. There is no evidence for osteomyelitis.  2. Left ankle x-ray on March 24, 2002, was a normal study.   LABORATORY DATA:  Left ankle wound culture, final culture with no growth.  The wbc was 16.3, rbc was 5.23, hemoglobin 15.8, hematocrit 36.3, MCV 88.4.  Sodium 138, potassium 3.3, chloride 91, CO2 36,  glucose 67, BUN 25,  creatinine 1.  AST 15, ALT 92, ALP 69, total bilirubin 0.7.   DISPOSITION:  The patient is being discharged home.   CONDITION ON DISCHARGE:  Stable.   HISTORY OF PRESENT ILLNESS:  [name] who has a skin  reaction secondary to penicillin.  He was hospitalized for two weeks and was  discharged on the day prior to readmission.  The patient presented to [facility] on March 24, 2002, with complaints left ankle  pain and swelling and decreased movement.  The patient did report being  compliant with his medication and discharge instructions.  Initial  evaluation noted the patient to be in  moderate distress secondary to his  left ankle pain with large bullous lesions to both ankles with the left  being greater than the right.  Left foot was positive for swelling and mild  erythema, mild warmness was noted.  The patient was also noted to have oral  thrush.  The patient was admitted for further evaluation and treatment.   HOSPITAL COURSE:  PROBLEM #1 - BILATERAL CELLULITIS OF HIS FEET WITH LEFT  GREATER THAN RIGHT:  The patient was admitted, started on IV clindamycin and  Tequin.  Local wound care was provided to his ruptured bullous lesions.  The  patient continued to complain of severe pain that was initially controlled  with IV analgesics along with p.o. analgesics.  At discharge, both feet are  essentially without any swelling.  Erythema has resolved.  The patient still  complains of mild to moderate pain especially with ambulation but he is able  to ambulate with a walker with partial weightbearing to his left foot.  He  is being discharged on an additional eight days of Tequin to complete a 10-  day course.  He is afebrile.  Wound cultures revealed no growth from his  left foot.   PROBLEM #2 - DRUG REACTION SECONDARY TO PENICILLIN WITH BULLOUS LESIONS TO  BILATERAL FEET:  The patient had ruptured bullous lesions to both feet  on  admission and a diffuse maculopapular rash to his extremities and his trunk  area with some fading noted.  He was started on p.o. prednisone.  The  patient had on symptoms of respiratory distress.  He was also treated  symptomatically with Benadryl at discharge.  His rash is fading with no new  lesions.  Bullous lesions have ruptured and are dry and scabbing.  His  prednisone will be tapered off.  CBGs were performed due to his steroid  therapy and will not be continued at discharge.   PROBLEM #3 - ORAL THRUSH:  This is most likely secondary to antibiotic  therapy.  The patient was provided with Mycelex Troches five times a day.  His oral thrush has resolved.   PROBLEM #4 - HYPERTENSION:  The patient has a history of hypertension and  was previously on Cardizem and hydrochlorothiazide.  He did have elevated  blood pressure readings with a systolic of 181 and diastolic of 96.  Lopressor was added 25 mg every eight hours with fair response. Discharge  systolic ranging in the 150s and diastolic ranging in the 80s.  Further  titration of his medications will be left to his primary care physician.   DISCHARGE INSTRUCTIONS:  The patient has been instructed to soak his feet in  warm water and antibacterial soap twice daily, pat dry, apply Silvadene  cream to blisters until healed.   FOLLOW UP:  He should follow up with his primary care physician in one to  two weeks.     Stephanie Swaziland, NP                      [name]. Polite, M.D.    SJ/MEDQ  D:  03/29/2002  T:  03/30/2002  Job:  [id number]

## 2010-07-28 NOTE — Op Note (Signed)
   NAMEABDOULAYE, DRUM                         ACCOUNT NO.:  [id number]   MEDICAL RECORD NO.:  [id number]                   PATIENT TYPE:  INP   LOCATION:  5727                                 FACILITY:  [facility]   PHYSICIAN:  Danise Edge, M.D.                DATE OF BIRTH:  [birth year]   DATE OF PROCEDURE:  03/10/2002  DATE OF DISCHARGE:                                 OPERATIVE REPORT   PROCEDURE:  Esophagogastroduodenoscopy.   ENDOSCOPIST:  Danise Edge, M.D.   INDICATIONS FOR PROCEDURE:  The patient is a 35 year old male, born on 1975/07/07.  The patient was admitted to [facility] on  March 04, 2002, to evaluate cramping abdominal pain with non-bloody  diarrhea and an episode of non-bloody vomitus.  A CT scan of the abdomen and  pelvis reveals thickening of the proximal small bowel.   PREMEDICATION:  Versed 10 mg, Demerol 50 mg.   ENDOSCOPE:  Olympus pediatric colonoscope.   DESCRIPTION OF PROCEDURE:  After obtaining an informed consent, the patient  was placed in the left lateral decubitus position.  I administered  intravenous Demerol and intravenous Versed, to achieve conscious sedation  for the procedure.  The patient's blood pressure, oxygen saturation and  cardiac rhythm were monitored throughout the procedure and documented in the  medical record.  The Olympus pediatric colonoscope was passed through the  posterior hypopharynx into the proximal esophagus without difficulty.  The  hypopharynx, larynx and vocal cords appeared normal.  ESOPHAGOSCOPY:  The proximal, mid and lower segments of the esophagus appear  normal.  GASTROSCOPY:  A retroflexed view of the gastric cardia and fundus was  normal.  The gastric body, antrum and pylorus appear normal.  ENTEROSCOPY:  I was able to examine the duodenum and proximal jejunum.  The  mucosa lining the duodenal bulb, descending duodenum, distal duodenum and  proximal jejunum is friable and bumpy  in appearance.  Multiple biopsies were  performed.  A biopsy was also taken from the distal gastric antrum for CLO  test.   ASSESSMENT:  1. Enteritis, rule out Crohn's disease, Helicobacter pylori, or ZE syndrome.   RECOMMENDATIONS:  1. Await CLO test and small bowel biopsies.  2. Serum gastrin.  3. Schedule small bowel follow-through x-ray series.  Rule out Crohn's     ileitis.  4. Start Protonix.                                               Danise Edge, M.D.    MJ/MEDQ  D:  03/10/2002  T:  03/10/2002  Job:  [id number]

## 2010-07-28 NOTE — H&P (Signed)
NAMETREMAIN, RUCINSKI               ACCOUNT NO.:  [id number]   MEDICAL RECORD NO.:  [id number]          PATIENT TYPE:  EMS   LOCATION:  ED                           FACILITY:  [facility]   PHYSICIAN:  Melissa L. Ladona Ridgel, MD  DATE OF BIRTH:  July 02, 1975   DATE OF ADMISSION:  06/08/2004  DATE OF DISCHARGE:                                HISTORY & PHYSICAL   CHIEF COMPLAINT:  My blood pressure is high, and my chest was bothering  me.   PRIMARY CARE PHYSICIAN:  Unassigned.  He has seen [facility] in the past.   HISTORY OF PRESENT ILLNESS:  The patient is a 35 year old African-American  male, last   DICTATION ENDS AT THIS POINT.      MLT/MEDQ  D:  06/08/2004  T:  06/08/2004  Job:  [id number]

## 2010-07-28 NOTE — Consult Note (Signed)
NAMEMARSEAN, ELKHATIB               ACCOUNT NO.:  [id number]   MEDICAL RECORD NO.:  [id number]          PATIENT TYPE:  OBV   LOCATION:  0104                         FACILITY:  [facility]   PHYSICIAN:  Alvarado Bing, M.D.  DATE OF BIRTH:  08-12-1975   DATE OF CONSULTATION:  06/09/2004  DATE OF DISCHARGE:                                   CONSULTATION   CARDIOLOGY CONSULTATION   REFERRING PHYSICIAN:  Melissa L. Ladona Ridgel, MD.   PRIMARY CARE PHYSICIAN:  None.   CARDIOLOGIST:  None.   HISTORY OF PRESENT ILLNESS:  Asked to see this very nice 35 year old  gentleman in consultation for evaluation of chest discomfort.  Mr. Bair has  enjoyed generally good health.  He was diagnosed with hypertension a few  years ago, but has not received significant treatment.  He has been seen in  the emergency department on a number of occasions for minor issues including  a rash, dental pain and a burn.  On all occasions, mild hypertension was  present.  The patient now presents with 24 hours of chills without rigors,  malaise, headache and vague myalgias.  He has a restless sensation.  On the  way to the hospital, he noted vague anterior chest discomfort without  dyspnea, diaphoresis or nausea.  This has currently resolved.  There was no  pleuritic component.  There was no effective movement or palpation.  Blood  pressure has been mildly elevated in the emergency department.  Initial  testing is unrevealing.   PAST MEDICAL HISTORY:  Past medical history is benign.  The patient was  admitted to the hospital only once when he experienced an adverse reaction  to PENICILLIN.  The medications, with which he was treated at that time,  including prednisone caused a significant elevation in his blood pressure.   MEDICATIONS:  He takes no medications routinely.  He has never previously  had an EKG.   ALLERGIES:  PENICILLIN.   SOCIAL HISTORY:  Smokes one or two cigars per week; denies excessive use of  alcohol; currently unemployed, previously worked in [employer].  Denies  illicit drugs.   FAMILY HISTORY:  Negative for significant coronary disease.   REVIEW OF SYSTEMS:  No cough or sputum production; no rhinorrhea nor  pharyngitis.  Temperature has not been elevated at home.   PHYSICAL EXAMINATION:  GENERAL:  On exam, pleasant, well-nourished appearing  gentleman.  VITAL SIGNS:  Temperature 99.4, blood pressure 150/105, heart rate 80 and  regular, respirations 20, O2 saturation 97% on room air.  HEENT:  Nonicteric sclerae; normal lens and conjunctiva; pupils equal, round  and reactive to light.  NECK:  No jugular venous distension; normal carotid upstrokes without  bruits.  ENDOCRINE:  No thyromegaly.  HEMAT:  No adenopathy.  LUNGS:  Clear.  CARDIAC:  Split first heart sounds; normal second heart sound.  ABDOMEN:  Soft and nontender; no organomegaly; no masses; normal bowel  sounds without bruits.  EXTREMITIES:  Normal distal pulses; no edema.  NEUROMUSCULAR:  Symmetric strength and tone.  MUSCULOSKELETAL:  No joint deformities.  SKIN:  No significant lesions.  EKG:  Normal sinus rhythm; borderline left atrial abnormality; shallow T  wave inversions in the inferior leads as well as V5-V6.   LABORATORY DATA:  All other laboratory including chemistry profile, CBC,  point of care markers are negative.  Drug screen yielded a positive for  tetrahydrocannabinol but was negative for cocaine and other drugs of abuse.   IMPRESSION:  Mr. Patlan presents with atypical chest pain and a very low  cardiovascular risk due to his age.  He does have hypertension, but this has  not been longstanding nor severe.  He has some tobacco consumption but his  risk remains quite low in the absence of diabetes or unusual medical  conditions that would predispose him to arthrosclerosis.  A lipid profile  will be obtained.  Serial cardiac markers and EKGs have been ordered.  An  echocardiogram is  warranted to evaluate for possible left ventricular  hypertrophy, which could be the etiology for his EKG abnormalities.  Likewise, an acute pressure overload could cause T wave inversions.  He does  not appear to have any electrolyte disturbance that would account for these  findings.   Initial antihypertensive therapy has been ordered.  The importance of long-  term use has been stressed to the patient.  Due to his very low risk of an  acute coronary syndrome, the risk/benefit ratio for full anticoagulation is  unfavorable.   We greatly appreciate the request for consultation and we will be happy to  follow this nice gentleman with you.      RR/MEDQ  D:  06/09/2004  T:  06/09/2004  Job:  [id number]

## 2010-07-28 NOTE — H&P (Signed)
[name], [name]               ACCOUNT NO.:  [id number]   MEDICAL RECORD NO.:  [id number]          PATIENT TYPE:  OBV   LOCATION:  0104                         FACILITY:  [facility]   PHYSICIAN:  Melissa L. Ladona Ridgel, MD  DATE OF BIRTH:  [birth year]   DATE OF ADMISSION:  06/08/2004  DATE OF DISCHARGE:                                HISTORY & PHYSICAL   CHIEF COMPLAINT:  My blood pressure is high, and my chest was bothering  me.   PRIMARY CARE PHYSICIAN:  Unassigned.  He has seen [facility] in the past.   HISTORY OF PRESENT ILLNESS:  The patient is a 35 year old African-American  male who last evening developed chills, then a headache, and then he said he  went to bed.  This morning he woke up feeling again quite ill, just weak and  restless.  He had his friend take his blood pressure.  It was 176/98.  He  denied any visual changes with this, but then later in the day was having  intermittent episodes of chest pain with pressure; he could not rate it with  a number.  He described it as sharp pressure.  It was intermittent.  He has  had this before, but never like this.  He denied any associated vomiting,  but did have some nausea with this.  He did not have any radiation of the  pain anywhere, and did not have any diaphoresis.  In the emergency room, the  patient was found to be hypertensive.  He was given clonidine.  Of note, the  patient was examined laying in bed, and was found to have 2 Darvocet laying  in the bed with him.   REVIEW OF SYSTEMS:  Further review of systems revealed no fever, no cough,  no dysuria.  He continues to have right-sided tooth pain, sensitive to cold,  as well as pressure.  He denies any PND, orthopnea, or dyspnea on exertion.  He denies any dysuria, as stated, and no melena or hematochezia.  All other  review of systems were negative.   PAST MEDICAL HISTORY:  1.  A recent toothache, for which he was given Keflex and Vicodin.  2.  Hypertension.  3.   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   PAST SURGICAL HISTORY:  None.   ALLERGIES:  PENICILLIN.   MEDICATIONS:  He has taken no medications in 2005.  In 2004, he was on  Lopressor, Cardizem, and hydrochlorothiazide.   SOCIAL HISTORY:  He smokes about 1-2 cigars per week.  He denies ethanol.  He says he has used marijuana in the past.  Interestingly enough, his UDS is  positive for marijuana and opiates.   FAMILY HISTORY:  Mom and dad are both living.  Their medical health is  unknown.   PHYSICAL EXAMINATION:  VITAL SIGNS:  Temperature is 99.4, blood pressure at  present 155/107, pulse 80, respirations 20, saturations 97%.  GENERAL:  He is in no acute distress, well-developed, well-nourished.  HEENT:  Normocephalic and atraumatic.  Pupils equal, round and reactive to  light.  Extraocular muscles are intact.  Mucous membranes are moist.  NECK:  Supple.  There is no JVD.  CHEST:  Clear to auscultation with no rales, rhonchi, or wheezes.  CARDIOVASCULAR:  Regular rate and rhythm.  Positive S1 and S2.  No S3 or S4.  No murmurs, rubs, or gallops were noted.  ABDOMEN:  Soft, nontender, nondistended, with positive bowel sounds.  EXTREMITIES:  No rash, clubbing, cyanosis, or edema.  NEUROLOGIC:  He is awake, alert, oriented x3.  Cranial nerves II-XII area  intact.  Power is 5/5.  DTRs are 2+.   LABORATORY DATA:  White count of 7.6, hemoglobin 17.5, hematocrit 50.9,  platelets 200.  Sodium is 127, potassium 3.6, chloride 105, CO2 of 28, BUN  8, creatinine 1.1.  Glucose is 96.  His chest x-ray shows no acute disease.  Point of care enzymes are negative x3.  UDS is positive for opiates and  marijuana.  His EKG reveals 69 beats per minute with intraventricular  conduction delay.  He has J point elevation in V2, and inverted in V4  through V6; II, III, and aVF  consistent with inferolateral ischemia, and  probably some LVH.   ASSESSMENT AND PLAN:  This is a 35 year old white male with a history of  hypertension who presents feeling poorly.  He states he has a headache, he  feels weak.  He had intermittent chest pain, and discovered his blood  pressure to be elevated.  In the emergency room, his blood pressure was  150's over 111.  He was given clonidine for this.  At this time, we will  admit him for 24-hour observation, and cardiology will officially evaluate  him in the morning.  I have discussed the case with [facility] on  call attending, and we have reviewed the EKGs together.  At this time, there  is no acute intervention, other than to start him on aspirin, anticoagulate  him, and follow cardiac markers, as well as repeat an EKG in the morning.   FURTHER ORDERS:  1.  Cardiovascular.  Uncontrolled hypertension.  He is status post clonidine      in the ED.  The plan is to start him on a beta blocker,      hydrochlorothiazide, and an ACE inhibitor.  Will check a 2-D echo, start      him on Lovenox, and cardiology will see him in the morning for [place].  2.  Pulmonary.  There are no complaints or issues.  His chest x-ray is      negative.  3.  GI.  Will start him on Protonix and a cardiac diet.  4.  GU:  There are no complaints at this time.  His urine drug screen is      positive for marijuana and opiates.  5.  Endocrine.  Will check a fasting lipid panel, and a TSH.  6.  Tooth pain.  The patient is allergic to penicillin, and has been given      Keflex.  Considering that his reaction to the penicillin was fairly      severe, I would like to steer away from the cephalosporins.  He also had      trouble with clindamycin in the past, so I would like to start him on      azithromycin 500 daily for that, and  he can have Vicodin for pain.      MLT/MEDQ  D:  06/08/2004  T:  06/09/2004  Job:  [id number]

## 2010-07-28 NOTE — Discharge Summary (Signed)
NAME:  [name] [name] NO.:  [id number]   MEDICAL RECORD NO.:  [id number]                    PATIENT TYPE:   LOCATION:                                       FACILITY:   PHYSICIAN:  Jackie Plum, M.D.             DATE OF BIRTH:  [birth year]   DATE OF ADMISSION:  04/04/2002  DATE OF DISCHARGE:  04/15/2002                                 DISCHARGE SUMMARY   DISCHARGE DIAGNOSES:  1. Leukocytoblastic vasculitis, improved.  2. Cellulitis, improved.  3. Hypertension, controlled.   DISCHARGE MEDICATIONS:  1. Hydrochlorothiazide 25 mg q. daily.  2. Cardizem 260 mg q. daily.  3. Lopressor 25 mg b.i.d.  4. Protonix 40 mg twice daily.  5. Percocet p.r.n. for pain.  6. Topical clotrimazole twice daily.  7. Valisone topical twice daily.  8. Bactroban topical twice daily.   ALLERGIES:  PENICILLIN causes severe rash.   PROCEDURES:  None.   HISTORY OF PRESENT ILLNESS:  The patient presented to the emergency  department on April 03, 2002 for the third time this month with diffuse  rash and bullous lesions of the legs which he sustained following  administration of penicillin.  After his first discharge he contracted lower  leg cellulitis and was discharged from the hospital on March 30, 2002.  According to his wife, the patient started having more swelling in the legs  over the last 48 hours prior to admission and more pain.  He has noted more  lesions on his legs.  No fever or chills.  He started developing a rash  after he took penicillin about a month ago for a Strep infection.  On the  upper part of his body the rash has been clearing up, but the legs have not  gotten any better.  The patient is admitted for further evaluation and  treatment of the same.   HOSPITAL COURSE:  The patient was admitted to a regular bed, provided with  wound care and wound care consult.  He was continued on IV Tequin and  clindamycin.  Blood cultures were performed  and found to be negative.  The  patient was provided with pain control and continued on a steroid taper.  He  was continued on his home medications for GERD and hypertension.  He was  provided with Lovenox for DVT prophylaxis.   The patient received OT and PT evaluations while here.  His lesions  continued to improve status post dressing changes and IV antibiotic therapy.  The patient completed his antibiotic course on April 11, 2002 and was  changed over to Protonix due to continued GERD symptoms on Zantac.   The patient will receive dressing changes by home health R. N. and is to  follow-up with [facility]. He also has a follow-up appointment scheduled  with Dr. Londell Moh who is a dermatologist later this month.  He is instructed  to keep  his appointment.   DISCHARGE LABORATORIES:  Urinalysis was negative for infection. Sodium 138,  potassium 3.5, BUN 5, creatinine 0.9.  Blood cultures were negative.   CONSULTATIONS:  None.   CONDITION ON DISCHARGE:  Good.   DISPOSITION:  Discharge to home with home health nursing for dressing  changes twice daily.   FOLLOW UP:  1. Follow-up is to be with [facility] as noted above.  2. Follow-up with Dr. Londell Moh of Dermatology.     Ellender Hose. Christian Mate, M.D.    SMD/MEDQ  D:  04/15/2002  T:  04/15/2002  Job:  [id number]   cc:   Norval Gable. [place], M.D.  [address] [place]  [place] [address]  Fax: [phone]

## 2010-07-28 NOTE — Consult Note (Signed)
[name], MESTA                         ACCOUNT NO.:  [id number]   MEDICAL RECORD NO.:  [id number]                   PATIENT TYPE:  INP   LOCATION:  3035                                 FACILITY:  [facility]   PHYSICIAN:  [name] Kindle., M.D.          DATE OF BIRTH:  1976-02-21   DATE OF CONSULTATION:  04/24/2002  DATE OF DISCHARGE:                                   CONSULTATION   REFERRING PHYSICIAN:  [name]. Polite, M.D.   HISTORY:  A nice 35 year old black male who was hospitalized here a month or  so ago.  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  The lesions on his legs were bullous and  became secondarily infected requiring debridement and antibiotics with  Tequin.  At that time he had abdominal pain.  There was a question of Henoch-  Schonlein purpura.  He had been seen by Dr. Londell Moh of dermatology and  apparently the vasculitis had been biopsied showing a leukocytoclastic  vasculitis.  The patient's abdominal pain was worked up by Dr. Laural Benes.  A  CT scan of the abdomen and pelvis showed thickening of the proximal jejunum.  This lead to endoscopy by Dr. Laural Benes with the pediatric Olympus colonoscope  to the full extent.  Biopsies were obtained. 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  The  patient clinically was treated with PPIs.  In addition to the CT scan of the  abdomen and pelvis and endoscopy, a small bowel series was obtained which  showed hyperacidity.  The patient was virtually asymptomatic in the hospital  on Protonix.  He was discharged home on the Protonix.  However, he indicated  to me that he could afford it and after discharge, he changed over to taking  over-the-counter Zantac.  He comes back in now with worsening abdominal  pain.  He has had no diarrhea, having normal flatus, normal bowel habits.  The patient finished his  antibiotic a week or so ago and he is no longer on  steroids.  Since his admission he has continued to have severe epigastric  pain, somewhat worse with eating but no nausea or vomiting.  He has had PPIs  here in the hospital.  He does already feel better.  He has just returned  now from a CT scan.   MEDICATIONS:  Hydrochlorothiazide, Cardizem, Lopressor, and he is now on  Protonix as well, but again, it was noted he was not taking this as an  outpatient.  He is taking IV morphine for pain and had been taking some  Percocet.   ALLERGIES:  PENICILLIN which is apparently what caused all these problems  with the leukocytoclastic vasculitis.  He has no other drug allergies.   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  He has had no previous  surgeries.   FAMILY HISTORY:  Noncontributory.   SOCIAL HISTORY:  He is married with no children.  He works for [employer].  Does smoke.  Does not drink.   PHYSICAL EXAMINATION:  GENERAL:  Pleasant, young black male lying in the  hospital bed in distress.  His left leg is propped up.  HEENT:  Sclerae nonicteric.  NECK:  Supple. No lymphadenopathy.  LUNGS:  Clear.  ABDOMEN:  Soft, probably nontender, nondistended currently.   LABORATORY DATA:  White count 9.9, hemoglobin 13.3.  LFTs totally normal.  Albumin slightly low to 3.4.   ASSESSMENT:  Abdominal pain - this is very suggestive simply of acid peptic  related pain.  The patient got quite a bit of relief in the hospital on  Protonix.  It is possible steroids could have played a part but with the CT  scan, small bowel series and endoscopy, he failed to show any other lesions  other than duodenitis, I would suspect this is the cause.  His gallbladder  is not __________ and we need to consider that at least while he is here.  Will continue to PPIs for now.  We will go ahead with the double dose proton  pump  inhibitor.                                               [name] Kindle., M.D.    Waldron Session  D:  04/24/2002  T:  04/24/2002  Job:  [id number]   cc:   Norval Gable. Houston, M.D.  [address] [place]  [place] [address]  Fax: [phone]   [name]. Polite, M.D.  [address]  [place], [place] [address]  Fax: [phone]

## 2010-07-28 NOTE — H&P (Signed)
[name], [name]                         ACCOUNT NO.:  [id number]   MEDICAL RECORD NO.:  [id number]                   PATIENT TYPE:  INP   LOCATION:  5729                                 FACILITY:  [facility]   PHYSICIAN:  Georgann Housekeeper, M.D.                 DATE OF BIRTH:  11/15/1975   DATE OF ADMISSION:  04/03/2002  DATE OF DISCHARGE:                                HISTORY & PHYSICAL   CHIEF COMPLAINT:  Leg swelling, pain and rash.   HISTORY OF PRESENT ILLNESS:  The patient is a 35 year old with history of  penicillin reaction about three or four weeks ago, with diffuse rash with  bullous lesions on the legs, was admitted about two weeks ago, discharged  and re-admitted and was again discharged on 03/30/2002, from the hospital  for lower leg cellulitis secondary infection.  He was discharged on  03/30/2002, with p.o. Tequin and some prednisone taper.  According to the  wife, the patient started having more swelling in the legs the last one or  two days and more pain, and he has noted some more lesions on the legs.  No  fevers or chills.  He started developing the rash after he took the  penicillin about a month ago for a strep infection.  On the upper part of  the body, the rash has been clearing up, but the legs have not gotten any  better.   PAST MEDICAL HISTORY:  Hypertension, __________ disease.   ALLERGIES:  PENICILLIN.   MEDICATIONS:  1. Hydrochlorothiazide 25 mg daily.  2. Cardizem  260 mg.  3. Lopressor 25 b.i.d.  4. Zantac 150 b.i.d.  5. Prednisone taper.  6. Percocet p.r.n. for pain.   PAST SURGICAL HISTORY:  None.   SOCIAL HISTORY:  Positive tobacco, no alcohol, positive marijuana.  He works  for [employer] service [employer].  He is married with no children.   FAMILY HISTORY:  Noncontributory.   PHYSICAL EXAMINATION:  VITAL SIGNS:  Temperature  98.3, blood pressure  151/78, pulse 83, saturations. 98%.  GENERAL:  [name], lying in bed complaining of leg pain,  elevated legs on  pillows.  LUNGS:  Clear.  HEENT:  No oral lesions.  NECK:  Supple.  SKIN:  The lower extremities showed bullous lesions and macular on both legs  all the way with 1+ swelling and pain.  There was a dry blister on the left  ankle.  No evidence of any discharge, induration or pus.  EXTREMITIES:  His feet were both warm to touch.   LABORATORY DATA:  White count 11,600, hemoglobin 14, platelets 198,000.  Sodium 136, potassium 3.4, BUN 15, creatinine 0.9, glucose 136.  LFTs are  normal.   IMPRESSION:  This is a 35 year old male with history of reaction to  penicillin, with diffuse rash, Steven-Johnson type, with secondary infection  of the legs, recently discharged on 03/30/2002, with p.o. antibiotics,  returns with some swelling and rash, according to the patient and wife.  Unclear if this is any worse than before.  White count is mildly elevated,  no fever.  Because of his symptoms with the pain worse, we will plan to  admit for intravenous antibiotics, bullae cultures, pain control and local  wound care.  His MRI was done during his last hospitalization and was  negative for any osteoarthritis on the foot and ankle.  If the lesions  remain stable it might be a normal process for the next two or three weeks  before it scabs off and scales.  Hypertension controlled with same  medications, continue on that.  Pain control.                                               Georgann Housekeeper, M.D.    [name]  D:  04/04/2002  T:  04/04/2002  Job:  [id number]

## 2010-07-28 NOTE — H&P (Signed)
NAME:  [name], [name]                         ACCOUNT NO.:  [id number]   MEDICAL RECORD NO.:  [id number]                   PATIENT TYPE:  INP   LOCATION:  5727                                 FACILITY:  [facility]   PHYSICIAN:  Hal T. Stoneking, M.D.              DATE OF BIRTH:  11-04-1975   DATE OF ADMISSION:  03/07/2002  DATE OF DISCHARGE:                                HISTORY & PHYSICAL   IDENTIFYING DATA:  The patient is a nice 35 year old black male who has  basically enjoyed good health.  Approximately four weeks ago he was started  on penicillin for strep throat.  Then approximately six days ago he  developed abdominal cramping pain which was intermittent associated with a  rash on his lower legs.  He was seen at the [facility] Emergency Room, was  given Bextra and Keflex.  He did not take the Keflex but did take the Bextra  and that seemed to help with some discomfort that he was having in his  knees.  The rash continued.  He saw Dr. Leta Speller approximately three  days ago and had a biopsy of the skin rash done.  He was started on  prednisone.  The Bextra and Vicodin were discontinued at that time.  Then  the abdominal cramping became worse and has been fairly persistent over the  last 48 hours.  Then over the last day he has developed diarrhea which was  heme-positive in the emergency room although he did not see any frank blood.  He has not been aware of any shaking chills or fever.   PAST MEDICAL HISTORY:  No history of hypertension, diabetes, heart disease  or tuberculosis.   ALLERGIES:  Question of allergic reaction to PENICILLIN.   CURRENT MEDICATIONS:  1. He has currently been taking the Bextra.  2. Prednisone 20 mg t.i.d.   FAMILY HISTORY:  Remarkable for diabetes and glaucoma.   PAST SURGICAL HISTORY:  None.   SOCIAL HISTORY:  The patient is single.  He works for a Customer service manager.  He occasionally smokes a cigar.  He does not consume alcohol.   Occasionally  uses marijuana, the last time three weeks ago.   FAMILY HISTORY:  Remarkable for diabetes and glaucoma.   REVIEW OF SYSTEMS:  No headache, no change in vision or hearing.  No trouble  with chewing, no swallowing.  He has had one episode of nausea and vomiting.  No chest pain, no cough, no dysuria, no penile discharge.   PHYSICAL EXAMINATION:  VITAL SIGNS:  Temperature 98.2, blood pressure  167/99, pulse 90, O2 saturation 99%.  SKIN:  Erythematous papular rash on his legs.  HEENT:  Pupils are equal, round and reactive to light.  Fundi are normal.  TMs normal.  Oropharynx normal.  NECK:  No JVD or thyromegaly.  LUNGS:  Clear.  HEART:  Regular rate and rhythm without murmurs, rubs  or gallops.  ABDOMEN:  Active bowel sounds, soft.  Abdomen has mild tenderness in the  left mid abdominal area.   LABORATORY DATA:  A CT scan revealed abnormal wall thickening in the  proximal jejunal loops, prominent mesenteric nodes.  White count 13,500,  hemoglobin 18, platelet count 361,000, neutrophils 78%, lymphocytes 15%,  monocytes 7%.  Sodium 138, potassium 3.6, chloride 101, bicarbonate 25,  glucose 131, BUN 13, creatinine 1.2, calcium 9.1, total protein 7.1, albumin  3.0, SGOT 12, SGPT 15, alkaline phosphatase 52.  Total bilirubin 0.9.  Amylase 24, lipase 15.   ASSESSMENT:  1. Gastroenteritis, viral versus bacterial now presenting with diarrhea.     Doubt Clostridium difficile although I suspect that is possible.  2. Dehydration.  3. Rash, question if this is a separate process due to penicillin allergy.  4. Elevated blood pressure, question secondary to the pain.  We will need to     follow his blood pressure values.   PLAN:  1. Admit IV fluids.  2. Will obtain stool cultures and Clostridium difficile.  3. Also await the skin biopsy reports.  4. We will hold off any antibiotics at this time and will continue the     prednisone started for the skin rash.                                                Hal T. Pete Glatter, M.D.    [facility]  D:  03/08/2002  T:  03/08/2002  Job:  [id number]

## 2010-07-28 NOTE — Discharge Summary (Signed)
NAMESEKAI, [name]                         ACCOUNT NO.:  [id number]   MEDICAL RECORD NO.:  [id number]                   PATIENT TYPE:  INP   LOCATION:  5708                                 FACILITY:  [facility]   PHYSICIAN:  Lazaro Arms, M.D.        DATE OF BIRTH:  02/17/1976   DATE OF ADMISSION:  03/07/2002  DATE OF DISCHARGE:  03/23/2002                                 DISCHARGE SUMMARY   DISCHARGE DIAGNOSES:  1. 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  2. Blistering on lower extremities causing severe pain and difficulty     walking secondary to #1.  3. Hypertension.  4. Status post gastroenteritis and bloody diarrhea.   CONSULTATIONS:  1. Dr. Laural Benes of gastroenterology performed an esophagogastroduodenoscopy     and several tests include a CLOtest, small-bowel biopsies, serum gastrin,     and a small-bowel follow through all of which turned out to be within     normal limits per Dr. Henriette Combs notes.  2. Dermatology was consulted while in hospital, Dr. Londell Moh, who had done     the original biopsies previous admission and his partner Dr. Amy Swaziland     on 03/19/02 came to look at the rash and confirm the diagnosis of     leukocytoblastic vasculitis.   BRIEF HISTORY OF PRESENT ILLNESS BY SYSTEMS:  1. Rash.  The patient was admitted with a progressively worsening rash and     blistering, although his initial complaint was the diarrhea which had     brought him to admission. The diarrhea resolved.  The rash continued to     worsen and he had some blistering on his legs.  Over the course of his     hospital admission the rash slowly improved with reinstitution of     systemic steroids, topical steroids, antihistamines and H2 blockers. The     patient was clinically improving and able to go home with just a small     amount of p.o. analgesia.  2. Bloody diarrhea and gastroenteritis.  This resolved several days after     admission and the patient was  taking p.o. and doing quite well without     any change in stool.  As mentioned before, gastroenterology consultation     was obtained and the patient had multiple studies all of which were     negative.  3. Hypertension.  The patient's blood pressures ranged from 140-160 and     Cardizem was instituted and dose titrated up.  Then hydrochlorothiazide     was also added to therapy and he still had suboptimal control.  4. In terms of the leukocytoblastic vasculitis, a workup for systemic causes     for vasculitis was instituted.  The patient was HIV negative, hepatitis     negative, and RPR negative.  His sedimentation rate was 35.  His CBC was     quite elevated, although this was thought  secondary to be steroids.  In     short, he had no signs of systemic illness, and this was thought to be a     reaction to either penicillin or strep infection.  The dermatologist was     consulted several days into admission when his rash was not improving,     and they felt that it had a benign prognosis; and the natural history of     the disease was that he would take a long time to resolve especially with     systemic steroids.  The patient was discharged home in stable condition     on 01/12.  He was able to ambulate with a walker.  He was fairly     comfortable on very minimal analgesia and he was told that he would not     be able to be on his feet for long periods of time as concluded by his     doctor.   DISCHARGE PLAN:  [facility]  He was given extensive  instructions on how to do so and we want him to follow up on his blood  pressure.  He was also told to follow up with Dr. Londell Moh for an appointment  within 2 weeks.   DISCHARGE MEDICATIONS:  1. Pepcid 20 mg p.o. b.i.d. for 2 weeks.  2. Atarax 50 mg p.o. q.6h. p.r.n.  3. Prednisone taper to taper over the next 5 weeks.  4. Cardizem 360 mg p.o. daily.  5. Hydrochlorothiazide 25 mg p.o. daily.  6. Percocet 1-2  tablets p.o. q.6h. p.r.n. and Nystatin 10 cc swish and spit     q.6h. p.r.n.   The patient had developed some thrush on the higher doses of prednisone.  He  was also told to follow up about the thrush with [facility] and will be  going to find out within the next 2 days.                                                Lazaro Arms, M.D.    [facility]  D:  03/23/2002  T:  03/24/2002  Job:  [id number]   cc:   [facility]   Norval Gable. [place], M.D.  [address] [place]  [place] [address]  Fax: [phone]

## 2010-11-08 ENCOUNTER — Emergency Department (HOSPITAL_COMMUNITY)
Admission: EM | Admit: 2010-11-08 | Discharge: 2010-11-09 | Disposition: A | Discharge status: Home / Self Care | Payer: Self-pay | Attending: Emergency Medicine | Admitting: Emergency Medicine

## 2010-11-08 DIAGNOSIS — K047 Periapical abscess without sinus: Secondary | ICD-10-CM | POA: Insufficient documentation

## 2010-11-08 DIAGNOSIS — I10 Essential (primary) hypertension: Secondary | ICD-10-CM | POA: Insufficient documentation

## 2010-12-19 LAB — COMPREHENSIVE METABOLIC PANEL
Alkaline Phosphatase: 62
BUN: 5 — ABNORMAL LOW
CO2: 29
Chloride: 105
GFR calc non Af Amer: >60
Glucose, Bld: 102 — ABNORMAL HIGH
Potassium: 3.5
Total Bilirubin: 0.6

## 2010-12-19 LAB — DIFFERENTIAL
Basophils Absolute: 0
Basophils Relative: 1
Monocytes Absolute: 0.4
Neutro Abs: 4.7
Neutrophils Relative %: 61

## 2010-12-19 LAB — POCT CARDIAC MARKERS
Myoglobin, poc: 67.3
Operator id: 198171

## 2010-12-19 LAB — CBC
HCT: 49.1
Hemoglobin: 16.7
MCV: 90.1
WBC: 7.6

## 2010-12-19 LAB — LIPASE, BLOOD: Lipase: 22

## 2010-12-25 LAB — URINALYSIS, ROUTINE W REFLEX MICROSCOPIC
Glucose, UA: NEGATIVE
Hgb urine dipstick: NEGATIVE
Protein, ur: NEGATIVE

## 2010-12-25 LAB — COMPREHENSIVE METABOLIC PANEL
BUN: 7
CO2: 31
Chloride: 105
Creatinine, Ser: 1.02
GFR calc non Af Amer: >60
Total Bilirubin: 0.8

## 2010-12-25 LAB — DIFFERENTIAL
Basophils Absolute: 0.1
Lymphocytes Relative: 32
Neutro Abs: 5.3
Neutrophils Relative %: 61

## 2010-12-25 LAB — CBC
HCT: 48.2
MCV: 87.5
Platelets: 245
WBC: 8.7

## 2010-12-25 LAB — LIPASE, BLOOD: Lipase: 19

## 2011-09-21 ENCOUNTER — Emergency Department (HOSPITAL_COMMUNITY)
Admission: EM | Admit: 2011-09-21 | Discharge: 2011-09-22 | Disposition: A | Discharge status: Home / Self Care | Payer: Self-pay | Attending: Emergency Medicine | Admitting: Emergency Medicine

## 2011-09-21 ENCOUNTER — Emergency Department (HOSPITAL_COMMUNITY): Payer: Self-pay

## 2011-09-21 ENCOUNTER — Encounter (HOSPITAL_COMMUNITY): Payer: Self-pay | Admitting: Emergency Medicine

## 2011-09-21 DIAGNOSIS — R0789 Other chest pain: Secondary | ICD-10-CM | POA: Insufficient documentation

## 2011-09-21 DIAGNOSIS — I10 Essential (primary) hypertension: Secondary | ICD-10-CM | POA: Insufficient documentation

## 2011-09-21 DIAGNOSIS — F17200 Nicotine dependence, unspecified, uncomplicated: Secondary | ICD-10-CM | POA: Insufficient documentation

## 2011-09-21 LAB — CBC
HCT: 46 % (ref 39.0–52.0)
Hemoglobin: 16.5 g/dL (ref 13.0–17.0)
MCH: 31 pg (ref 26.0–34.0)
MCHC: 35.9 g/dL (ref 30.0–36.0)
MCV: 86.3 fL (ref 78.0–100.0)
Platelets: 244 10*3/uL (ref 150–400)
RBC: 5.33 MIL/uL (ref 4.22–5.81)
RDW: 14.2 % (ref 11.5–15.5)
WBC: 9.4 10*3/uL (ref 4.0–10.5)

## 2011-09-21 LAB — BASIC METABOLIC PANEL
BUN: 9 mg/dL (ref 6–23)
CO2: 24 mEq/L (ref 19–32)
Calcium: 9.2 mg/dL (ref 8.4–10.5)
Chloride: 104 mEq/L (ref 96–112)
Creatinine, Ser: 0.97 mg/dL (ref 0.50–1.35)
GFR calc Af Amer: >90 mL/min (ref >90)
GFR calc non Af Amer: >90 mL/min (ref >90)
Glucose, Bld: 135 mg/dL — ABNORMAL HIGH (ref 70–99)
Potassium: 3.5 mEq/L (ref 3.5–5.1)
Sodium: 138 mEq/L (ref 135–145)

## 2011-09-21 LAB — TROPONIN I: Troponin I: <0.3 ng/mL (ref <0.30)

## 2011-09-21 MED ORDER — ASPIRIN 325 MG PO TABS
325.0000 mg | ORAL_TABLET | ORAL | Status: AC
  Administered 2011-09-21: 325 mg via ORAL
  Filled 2011-09-21: qty 4

## 2011-09-21 NOTE — ED Notes (Signed)
Bed:WA22<BR> Expected date:<BR> Expected time:<BR> Means of arrival:<BR> Comments:<BR> Triage 2 

## 2011-09-21 NOTE — ED Notes (Signed)
MD at bedside. 

## 2011-09-21 NOTE — ED Notes (Signed)
Pt alert, nad, c/o left sided chest pain, describes as sharp non radiating, onset this evening, states "i was lying down getting ready to go to work", resp even unlabored, skin pwd

## 2011-09-22 MED ORDER — LISINOPRIL 10 MG PO TABS: 10.0000 mg | ORAL_TABLET | Freq: Every day | ORAL | Status: DC

## 2011-09-22 MED ORDER — HYDROCHLOROTHIAZIDE 25 MG PO TABS: 25.0000 mg | ORAL_TABLET | Freq: Every day | ORAL | Status: DC

## 2011-09-22 NOTE — ED Provider Notes (Signed)
History    36yM with CP. Sharp. L chest. Intermittent. Lasts seconds. No appreciable exacerbating or relieving factors. No SOB. No fever or chills. No n/v or diaphoresis. No palpitations. No unusual leg pain or swelling. No back pain. Denies hx of known CAD. Hx of HTN but hasn't taken his meds in months. Denies hx of blood clot.  CSN: [id number]  Arrival date & time 09/21/11  2210   First MD Initiated Contact with Patient 09/21/11 2234      Chief Complaint  Patient presents with  . Chest Pain    (Consider location/radiation/quality/duration/timing/severity/associated sxs/prior treatment) HPI  Past Medical History  Diagnosis Date  . Hypertension   . Wears glasses     Past Surgical History  Procedure Date  . Cholecystectomy     Family History  Problem Relation Age of Onset  . Cancer Mother     History  Substance Use Topics  . Smoking status: Current Everyday Smoker -- 1.0 packs/day    Types: Cigarettes  . Smokeless tobacco: Never Used  . Alcohol Use: No      Review of Systems   Review of symptoms negative unless otherwise noted in HPI.   Allergies  Penicillins  Home Medications   Current Outpatient Rx  Name Route Sig Dispense Refill  . IBUPROFEN 200 MG PO TABS Oral Take 200 mg by mouth every 6 (six) hours as needed. Pain      BP 207/133  Pulse 76  Temp 98 F (36.7 C)  Resp 17  Wt 230 lb (104.327 kg)  SpO2 98%  Physical Exam  Nursing note and vitals reviewed. Constitutional: He appears well-developed and well-nourished. No distress.  HENT:  Head: Normocephalic and atraumatic.  Eyes: Conjunctivae are normal. Right eye exhibits no discharge. Left eye exhibits no discharge.  Neck: Neck supple.  Cardiovascular: Normal rate, regular rhythm and normal heart sounds.  Exam reveals no gallop and no friction rub.   No murmur heard. Pulmonary/Chest: Effort normal and breath sounds normal. No respiratory distress.  Abdominal: Soft. He exhibits no  distension. There is no tenderness.  Musculoskeletal: He exhibits no edema and no tenderness.       Lower extremities symmetric as compared to each other. No calf tenderness. Negative Homan's. No palpable cords.   Neurological: He is alert.  Skin: Skin is warm and dry. He is not diaphoretic.  Psychiatric: He has a normal mood and affect. His behavior is normal. Thought content normal.    ED Course  Procedures (including critical care time)  Labs Reviewed  BASIC METABOLIC PANEL - Abnormal; Notable for the following:    Glucose, Bld 135 (*)     All other components within normal limits  CBC  TROPONIN I  LAB REPORT - SCANNED   Chest Portable 1 View  09/21/2011  *RADIOLOGY REPORT*  Clinical Data: Left-sided chest pain.  Smoker.  High blood pressure.  PORTABLE CHEST - 1 VIEW  Comparison: 05/19/2010 chestx-ray and CT.  Findings: Central pulmonary vascular prominence without pulmonary edema.  Cardiomegaly.  Tortuous aorta.  No infiltrate or pneumothorax.  CT detected anterior mediastinal soft tissue process (05/19/2010 exam) not addressed on the present exam. Please see prior CT report.  IMPRESSION:  Central pulmonary vascular prominence without pulmonary edema.  Cardiomegaly.  Tortuous aorta.  No infiltrate or pneumothorax.  CT detected anterior mediastinal soft tissue process (05/19/2010 exam) not addressed on the present exam.  Original Report Authenticated By: Fuller Canada, M.D.   EKG:  Rhythm: sinus Rate:  77 Axis: normal Intervals: LVH w/ strain ST segments: NS ST changes   1. Atypical chest pain       MDM  36yM with CP. Atypical for ACS. Poorly controlled HTN. Scripts provided. Return precautions discussed. Doubt PE or infectious. Outpt fu.        Raeford Razor, MD 09/24/11 [phone]

## 2011-10-05 ENCOUNTER — Encounter (HOSPITAL_COMMUNITY): Payer: Self-pay | Admitting: *Deleted

## 2011-10-05 ENCOUNTER — Emergency Department (HOSPITAL_COMMUNITY)
Admission: EM | Admit: 2011-10-05 | Discharge: 2011-10-05 | Disposition: A | Discharge status: Home / Self Care | Payer: Self-pay | Attending: Emergency Medicine | Admitting: Emergency Medicine

## 2011-10-05 DIAGNOSIS — F17200 Nicotine dependence, unspecified, uncomplicated: Secondary | ICD-10-CM | POA: Insufficient documentation

## 2011-10-05 DIAGNOSIS — M549 Dorsalgia, unspecified: Secondary | ICD-10-CM

## 2011-10-05 DIAGNOSIS — I10 Essential (primary) hypertension: Secondary | ICD-10-CM

## 2011-10-05 MED ORDER — METHOCARBAMOL 500 MG PO TABS: 1000.0000 mg | ORAL_TABLET | Freq: Four times a day (QID) | ORAL | Status: AC

## 2011-10-05 MED ORDER — CYCLOBENZAPRINE HCL 10 MG PO TABS
5.0000 mg | ORAL_TABLET | Freq: Once | ORAL | Status: AC
  Administered 2011-10-05: 5 mg via ORAL
  Filled 2011-10-05: qty 1

## 2011-10-05 MED ORDER — HYDROCODONE-ACETAMINOPHEN 5-325 MG PO TABS: ORAL_TABLET | ORAL | Status: AC

## 2011-10-05 MED ORDER — OXYCODONE-ACETAMINOPHEN 5-325 MG PO TABS
1.0000 | ORAL_TABLET | Freq: Once | ORAL | Status: AC
  Administered 2011-10-05: 1 via ORAL
  Filled 2011-10-05: qty 1

## 2011-10-05 NOTE — ED Notes (Signed)
Pt reports right leg pain x 2 days, denies known injury. Pain in right hip and radiates down leg.

## 2011-10-05 NOTE — ED Notes (Signed)
Pt states "didn't get the Rx's filled that I got last time for the b/p meds d/t finances, but the pain in my leg is a 20, I haven't done nothing, haven't worked since last Saturday".

## 2011-10-05 NOTE — ED Provider Notes (Signed)
History     CSN: [id number]  Arrival date & time 10/05/11  1205   First MD Initiated Contact with Patient 10/05/11 1220      Chief Complaint  Patient presents with  . Leg Pain    (Consider location/radiation/quality/duration/timing/severity/associated sxs/prior treatment) HPI Comments: Patient presents with c/o R buttock pain, radiating into R leg, described as shooting x 2 days. No known injury. No red flag s/s. BP noted at last visit, patient rx BP meds but he hasn't filled meds. States he will today. PCP appt in several weeks. No red flags of lower back pain. No h/o sciatica. He has taken ibuprofen without relief. Nothing makes symptoms better or worse. Onset gradual. Course constant.   Patient is a 36 y.o. male presenting with leg pain. The history is provided by the patient.  Leg Pain  The incident occurred 2 days ago. There was no injury mechanism. The pain is present in the right thigh and right leg. The quality of the pain is described as throbbing. The pain is moderate. The pain has been constant since onset. Pertinent negatives include no numbness, no inability to bear weight, no loss of motion, no loss of sensation and no tingling. Nothing aggravates the symptoms. He has tried NSAIDs for the symptoms. The treatment provided no relief.    Past Medical History  Diagnosis Date  . Hypertension   . Wears glasses     Past Surgical History  Procedure Date  . Cholecystectomy     Family History  Problem Relation Age of Onset  . Cancer Mother     History  Substance Use Topics  . Smoking status: Current Everyday Smoker -- 1.0 packs/day    Types: Cigarettes  . Smokeless tobacco: Never Used  . Alcohol Use: No      Review of Systems  Constitutional: Negative for fever and unexpected weight change.  Gastrointestinal: Negative for constipation.       Neg for fecal incontinence  Genitourinary: Negative for hematuria, flank pain and difficulty urinating.       Negative  for urinary incontinence or retention  Musculoskeletal: Positive for back pain.  Neurological: Negative for tingling, weakness and numbness.       Negative for saddle paresthesias     Allergies  Penicillins  Home Medications   Current Outpatient Rx  Name Route Sig Dispense Refill  . DIPHENHYDRAMINE-APAP (SLEEP) 25-500 MG PO TABS Oral Take 2 tablets by mouth once.    . IBUPROFEN 200 MG PO TABS Oral Take 800 mg by mouth every 8 (eight) hours as needed. Pain    . HYDROCHLOROTHIAZIDE 25 MG PO TABS Oral Take 1 tablet (25 mg total) by mouth daily. 30 tablet 1  . LISINOPRIL 10 MG PO TABS Oral Take 1 tablet (10 mg total) by mouth daily. 30 tablet 0    BP 194/137  Pulse 69  Temp 98.7 F (37.1 C) (Oral)  Resp 22  SpO2 99%  Physical Exam  Nursing note and vitals reviewed. Constitutional: He appears well-developed and well-nourished.  HENT:  Head: Normocephalic and atraumatic.  Eyes: Conjunctivae are normal.  Neck: Normal range of motion.  Abdominal: Soft. There is no tenderness. There is no CVA tenderness.  Musculoskeletal: Normal range of motion. He exhibits no tenderness.       Cervical back: Normal.       Thoracic back: Normal.       Lumbar back: He exhibits tenderness. He exhibits normal range of motion and no bony tenderness.  Back:       No step-off noted with palpation of spine.   Neurological: He is alert. He has normal reflexes. No sensory deficit. He exhibits normal muscle tone.       5/5 strength in entire lower extremities bilaterally. No sensation deficit.   Skin: Skin is warm and dry.  Psychiatric: He has a normal mood and affect.    ED Course  Procedures (including critical care time)  Labs Reviewed - No data to display No results found.   1. Back pain   2. Hypertension     1:08 PM Patient seen and examined. Medications ordered.   Vital signs reviewed and are as follows: Filed Vitals:   10/05/11 1215  BP: 194/137  Pulse: 69  Temp: 98.7 F  (37.1 C)  Resp: 22   BP noted. Patient to fill rx today. Patient was informed of high blood pressure reading today.  Patient was counseled about long-term health problems hypertension can cause and was urged to follow-up with a primary care doctor in the next week for a blood pressure recheck.  Patient verbalized understanding.  Patient was seen and examined. No red flag s/s of low back pain. Patient was counseled on back pain precautions and told to do activity as tolerated but do not lift, push, or pull heavy objects more than 10 pounds for the next week.  Patient counseled to use ice or heat on back for no longer than 15 minutes every hour.   Patient prescribed muscle relaxer and counseled on proper use of muscle relaxant medication.    Patient prescribed narcotic pain medicine and counseled on proper use of narcotic pain medications. Counseled not to combine this medication with others containing tylenol.   Urged patient not to drink alcohol, drive, or perform any other activities that requires focus while taking either of these medications.  Patient urged to follow-up with PCP if pain does not improve with treatment and rest or if pain becomes recurrent. Urged to return with worsening severe pain, loss of bowel or bladder control, trouble walking.   The patient verbalizes understanding and agrees with the plan.  BP 183/113  Pulse 69  Temp 98.7 F (37.1 C) (Oral)  Resp 22  SpO2 99%    MDM  BP: No obvious signs of end-organ damage -- patient is not here for BP complaint today. He agrees to fill and take BP meds. Current pain may be contributing today.  Patient with back pain. No neurological deficits. Patient is ambulatory. No warning symptoms of back pain including: loss of bowel or bladder control, night sweats, waking from sleep with back pain, unexplained fevers or weight loss, h/o cancer, IVDU, recent trauma. No concern for cauda equina, epidural abscess, or other serious cause  of back pain. Conservative measures such as rest, ice/heat and pain medicine indicated with PCP follow-up if no improvement with conservative management.          Renne Crigler, [place] 10/05/11 1606

## 2011-10-09 NOTE — ED Provider Notes (Signed)
Medical screening examination/treatment/procedure(s) were performed by non-physician practitioner and as supervising physician I was immediately available for consultation/collaboration.  Raeford Razor, MD 10/09/11 1028

## 2011-12-06 ENCOUNTER — Encounter (HOSPITAL_COMMUNITY): Payer: Self-pay

## 2011-12-06 ENCOUNTER — Emergency Department (HOSPITAL_COMMUNITY)
Admission: EM | Admit: 2011-12-06 | Discharge: 2011-12-06 | Disposition: A | Discharge status: Home / Self Care | Payer: Self-pay | Attending: Emergency Medicine | Admitting: Emergency Medicine

## 2011-12-06 DIAGNOSIS — Z79899 Other long term (current) drug therapy: Secondary | ICD-10-CM | POA: Insufficient documentation

## 2011-12-06 DIAGNOSIS — I10 Essential (primary) hypertension: Secondary | ICD-10-CM | POA: Insufficient documentation

## 2011-12-06 DIAGNOSIS — Z880 Allergy status to penicillin: Secondary | ICD-10-CM | POA: Insufficient documentation

## 2011-12-06 DIAGNOSIS — K0889 Other specified disorders of teeth and supporting structures: Secondary | ICD-10-CM

## 2011-12-06 DIAGNOSIS — K089 Disorder of teeth and supporting structures, unspecified: Secondary | ICD-10-CM | POA: Insufficient documentation

## 2011-12-06 DIAGNOSIS — F17200 Nicotine dependence, unspecified, uncomplicated: Secondary | ICD-10-CM | POA: Insufficient documentation

## 2011-12-06 MED ORDER — CLINDAMYCIN HCL 150 MG PO CAPS: 300.0000 mg | ORAL_CAPSULE | Freq: Two times a day (BID) | ORAL | Status: DC

## 2011-12-06 MED ORDER — CLINDAMYCIN HCL 300 MG PO CAPS
300.0000 mg | ORAL_CAPSULE | Freq: Once | ORAL | Status: AC
  Administered 2011-12-06: 300 mg via ORAL
  Filled 2011-12-06: qty 1

## 2011-12-06 MED ORDER — HYDROCODONE-ACETAMINOPHEN 5-325 MG PO TABS: 2.0000 | ORAL_TABLET | ORAL | Status: DC | PRN

## 2011-12-06 MED ORDER — OXYCODONE-ACETAMINOPHEN 5-325 MG PO TABS
1.0000 | ORAL_TABLET | Freq: Once | ORAL | Status: AC
  Administered 2011-12-06: 1 via ORAL
  Filled 2011-12-06: qty 1

## 2011-12-06 NOTE — ED Notes (Addendum)
Pt reports rt upper dental pain x2 days ago - pt admits to hx of trauma to the tooth several months ago - pt taking ibuprofen at home w/ minimal relief. Denies swelling or fever.

## 2011-12-06 NOTE — ED Notes (Signed)
Pt ambulating independently w/ steady gait on d/c in no acute distress, A&Ox4. D/c instructions reviewed w/ pt - pt denies any further questions or concerns at present. Rx given x2  

## 2011-12-06 NOTE — ED Notes (Signed)
Per pt, tooth knocked out/broken 7 months ago.  Now, pt having pain in same area.  Obvious tooth discoloration to front tooth.  Pt does not have dentist.  Has not seeked prior tx.

## 2011-12-06 NOTE — ED Provider Notes (Signed)
Medical screening examination/treatment/procedure(s) were performed by non-physician practitioner and as supervising physician I was immediately available for consultation/collaboration.   Brihana Quickel L Camarie Mctigue, MD 12/06/11 0647 

## 2011-12-06 NOTE — ED Provider Notes (Signed)
History     CSN: [id number]  Arrival date & time 12/06/11  0208   First MD Initiated Contact with Patient 12/06/11 0353      Chief Complaint  Patient presents with  . Dental Pain    (Consider location/radiation/quality/duration/timing/severity/associated sxs/prior treatment) HPI History provided by pt.   Pt was elbowed in the mouth during a basketball game several months ago and sustained partial avulsion of his right upper central incisor.  Was never seen by a dentist and now with decay.  Developed severe pain with associated edema yesterday.  Denies fever.  Has taken ibuprofen with some relief.  Past Medical History  Diagnosis Date  . Hypertension   . Wears glasses     Past Surgical History  Procedure Date  . Cholecystectomy     Family History  Problem Relation Age of Onset  . Cancer Mother     History  Substance Use Topics  . Smoking status: Current Every Day Smoker -- 1.0 packs/day    Types: Cigarettes  . Smokeless tobacco: Never Used  . Alcohol Use: No      Review of Systems  All other systems reviewed and are negative.    Allergies  Penicillins  Home Medications   Current Outpatient Rx  Name Route Sig Dispense Refill  . HYDROCHLOROTHIAZIDE 25 MG PO TABS Oral Take 1 tablet (25 mg total) by mouth daily. 30 tablet 1  . LISINOPRIL 10 MG PO TABS Oral Take 1 tablet (10 mg total) by mouth daily. 30 tablet 0  . CLINDAMYCIN HCL 150 MG PO CAPS Oral Take 2 capsules (300 mg total) by mouth 2 (two) times daily. 28 capsule 0  . HYDROCODONE-ACETAMINOPHEN 5-325 MG PO TABS Oral Take 2 tablets by mouth every 4 (four) hours as needed for pain. 20 tablet 0    BP 161/109  Pulse 68  Temp 98.1 F (36.7 C) (Oral)  Resp 26  SpO2 100%  Physical Exam  Nursing note and vitals reviewed. Constitutional: He is oriented to person, place, and time. He appears well-developed and well-nourished.  HENT:  Head: Normocephalic and atraumatic. No trismus in the jaw.    Mouth/Throat: Uvula is midline and oropharynx is clear and moist.       Right upper central incisor is almost completely avulsed and is severely decayed and ttp.  Advanced caries on adjacent teeth as well.  Adjacent gingiva appears normal.  No edema of buccal mucosa.    Eyes:       Normal appearance  Neck: Normal range of motion. Neck supple.       No submandibular edema  Cardiovascular: Normal rate and regular rhythm.   Pulmonary/Chest: Effort normal and breath sounds normal.  Lymphadenopathy:    He has no cervical adenopathy.  Neurological: He is alert and oriented to person, place, and time.  Psychiatric: He has a normal mood and affect. His behavior is normal.    ED Course  Procedures (including critical care time)  Labs Reviewed - No data to display No results found.   1. Pain, dental       MDM  Pt presents w/ dental pain.  Clinda (pen-allergic) prescribed for possible periapical abscess and vicodin for pain.  Referred to dentist on call and given a list of several low cost dental clinics in [place] and [place].  Pt is aware that he is hypertensive today.  He has been compliant w/ meds but does not have a PCP to follow up with.  I have given  him the phone number for [facility] and we have discussed the potential complications of uncontrolled HTN.  He denies headache, vision changes, CP and SOB today.  Return precautions discussed.         Otilio Miu, [place] 12/06/11 [phone]

## 2012-03-02 ENCOUNTER — Encounter (HOSPITAL_COMMUNITY): Payer: Self-pay | Admitting: Emergency Medicine

## 2012-03-02 ENCOUNTER — Emergency Department (HOSPITAL_COMMUNITY)
Admission: EM | Admit: 2012-03-02 | Discharge: 2012-03-02 | Disposition: A | Discharge status: Home / Self Care | Payer: Self-pay | Attending: Emergency Medicine | Admitting: Emergency Medicine

## 2012-03-02 DIAGNOSIS — R05 Cough: Secondary | ICD-10-CM | POA: Insufficient documentation

## 2012-03-02 DIAGNOSIS — F17200 Nicotine dependence, unspecified, uncomplicated: Secondary | ICD-10-CM | POA: Insufficient documentation

## 2012-03-02 DIAGNOSIS — R059 Cough, unspecified: Secondary | ICD-10-CM | POA: Insufficient documentation

## 2012-03-02 DIAGNOSIS — I10 Essential (primary) hypertension: Secondary | ICD-10-CM

## 2012-03-02 DIAGNOSIS — Z79899 Other long term (current) drug therapy: Secondary | ICD-10-CM | POA: Insufficient documentation

## 2012-03-02 DIAGNOSIS — K089 Disorder of teeth and supporting structures, unspecified: Secondary | ICD-10-CM | POA: Insufficient documentation

## 2012-03-02 DIAGNOSIS — K0889 Other specified disorders of teeth and supporting structures: Secondary | ICD-10-CM

## 2012-03-02 MED ORDER — CLINDAMYCIN HCL 150 MG PO CAPS
150.0000 mg | ORAL_CAPSULE | Freq: Four times a day (QID) | ORAL | Status: DC
Start: 2012-03-02 — End: 2012-03-31

## 2012-03-02 MED ORDER — LISINOPRIL 20 MG PO TABS: 20.0000 mg | ORAL_TABLET | Freq: Every day | ORAL | Status: DC

## 2012-03-02 MED ORDER — OXYCODONE-ACETAMINOPHEN 5-325 MG PO TABS
2.0000 | ORAL_TABLET | Freq: Once | ORAL | Status: AC
  Administered 2012-03-02: 2 via ORAL
  Filled 2012-03-02: qty 2

## 2012-03-02 MED ORDER — HYDROCODONE-ACETAMINOPHEN 5-325 MG PO TABS: 2.0000 | ORAL_TABLET | Freq: Four times a day (QID) | ORAL | Status: DC | PRN

## 2012-03-02 MED ORDER — CLINDAMYCIN HCL 300 MG PO CAPS
300.0000 mg | ORAL_CAPSULE | Freq: Once | ORAL | Status: AC
  Administered 2012-03-02: 300 mg via ORAL
  Filled 2012-03-02: qty 1

## 2012-03-02 NOTE — ED Provider Notes (Signed)
Medical screening examination/treatment/procedure(s) were performed by non-physician practitioner and as supervising physician I was immediately available for consultation/collaboration.   Flint Melter, MD 03/02/12 [phone]

## 2012-03-02 NOTE — ED Notes (Signed)
Pt states he has a toothache on the left side unsure if the tooth is on the top or the bottom  States has had pain for the past 2 to 3 days  Pt states he has also had a cough since before thanksgiving that will not go away

## 2012-03-02 NOTE — ED Provider Notes (Signed)
History     CSN: [id number]  Arrival date & time 03/02/12  2019   First MD Initiated Contact with Patient 03/02/12 2138      Chief Complaint  Patient presents with  . Dental Pain  . Cough    (Consider location/radiation/quality/duration/timing/severity/associated sxs/prior treatment) HPI  36 year old male with hx of hypertension presents c/o toothache.  Pt reports for the past 2 days he has gradual onset of intermittent sharp achy pain to his teeth to his L jaw.  Pain is moderate in severity, now throbbing, worsening with eating, cold or hot water or cold air.  Pain is non radiating.  Also endorse a non productive cough for nearly a month.  Denies fever, chills, vision changes, ear pain, sore throat, neck pain, cp, rash.  Denies recent trauma.    Pt has been taking his BP medications (hydrodiuril and lisinopril daily since last seen in the ER and was found to be hypertensive).  Acknowledge that BP is high but denies cp, sob, dysuria, hematuria, numbness or weakness.    Past Medical History  Diagnosis Date  . Hypertension   . Wears glasses     Past Surgical History  Procedure Date  . Cholecystectomy     Family History  Problem Relation Age of Onset  . Cancer Mother     History  Substance Use Topics  . Smoking status: Current Every Day Smoker -- 1.0 packs/day    Types: Cigars  . Smokeless tobacco: Never Used  . Alcohol Use: Yes     Comment: occ      Review of Systems  All other systems reviewed and are negative.    Allergies  Penicillins  Home Medications   Current Outpatient Rx  Name  Route  Sig  Dispense  Refill  . HYDROCHLOROTHIAZIDE 25 MG PO TABS   Oral   Take 1 tablet (25 mg total) by mouth daily.   30 tablet   1   . LISINOPRIL 10 MG PO TABS   Oral   Take 1 tablet (10 mg total) by mouth daily.   30 tablet   0   . CLINDAMYCIN HCL 150 MG PO CAPS   Oral   Take 2 capsules (300 mg total) by mouth 2 (two) times daily.   28 capsule   0   .  HYDROCODONE-ACETAMINOPHEN 5-325 MG PO TABS   Oral   Take 2 tablets by mouth every 4 (four) hours as needed for pain.   20 tablet   0     BP 203/129  Pulse 80  Temp 98.2 F (36.8 C) (Oral)  Resp 20  SpO2 99%  Physical Exam  Nursing note and vitals reviewed. Constitutional: He is oriented to person, place, and time. He appears well-developed and well-nourished. He appears distressed (uncomfortable, holding the left side of his face).  HENT:  Head: Normocephalic and atraumatic.  Right Ear: External ear normal.  Left Ear: External ear normal.  Mouth/Throat: Uvula is midline.    Eyes: Conjunctivae normal and EOM are normal. Pupils are equal, round, and reactive to light.  Neck: Normal range of motion. Neck supple.  Cardiovascular: Normal rate and regular rhythm.   Pulmonary/Chest: Effort normal and breath sounds normal. No respiratory distress. He exhibits no tenderness.  Abdominal: Soft.  Musculoskeletal: He exhibits no edema.  Lymphadenopathy:    He has no cervical adenopathy.  Neurological: He is alert and oriented to person, place, and time. He has normal strength. No cranial nerve deficit  or sensory deficit. Gait normal. GCS eye subscore is 4. GCS verbal subscore is 5. GCS motor subscore is 6.  Skin: Skin is warm. No rash noted.    ED Course  Procedures (including critical care time)  Labs Reviewed - No data to display No results found.   No diagnosis found.  1. Dental pain 2. hypertension  MDM  Pt with dental pain, and pain is localized to L upper premolar.  Will give abx and pain meds.  Pt is hypertensive at 203/129 initially. It was 181/116 on recheck.  I discussed this with Dr. Effie Shy, who recommend increase lisinopril to 20mg  daily.  Pt urge to fu with PCP for further management of his medical condition.  Will give referral to dentist as well.    10:58 PM  Increase lisinopril to 20mg  daily  BP 181/116  Pulse 69  Temp 98.2 F (36.8 C) (Oral)  Resp 16   SpO2 97%    Fayrene Helper, PA-C 03/02/12 2259

## 2012-03-03 ENCOUNTER — Encounter (HOSPITAL_COMMUNITY): Payer: Self-pay | Admitting: *Deleted

## 2012-03-03 ENCOUNTER — Emergency Department (HOSPITAL_COMMUNITY)
Admission: EM | Admit: 2012-03-03 | Discharge: 2012-03-04 | Disposition: A | Discharge status: Home / Self Care | Payer: Self-pay | Attending: Emergency Medicine | Admitting: Emergency Medicine

## 2012-03-03 DIAGNOSIS — K0889 Other specified disorders of teeth and supporting structures: Secondary | ICD-10-CM

## 2012-03-03 DIAGNOSIS — K029 Dental caries, unspecified: Secondary | ICD-10-CM | POA: Insufficient documentation

## 2012-03-03 DIAGNOSIS — I10 Essential (primary) hypertension: Secondary | ICD-10-CM | POA: Insufficient documentation

## 2012-03-03 DIAGNOSIS — F17200 Nicotine dependence, unspecified, uncomplicated: Secondary | ICD-10-CM | POA: Insufficient documentation

## 2012-03-03 DIAGNOSIS — K089 Disorder of teeth and supporting structures, unspecified: Secondary | ICD-10-CM | POA: Insufficient documentation

## 2012-03-03 DIAGNOSIS — Z79899 Other long term (current) drug therapy: Secondary | ICD-10-CM | POA: Insufficient documentation

## 2012-03-03 DIAGNOSIS — K047 Periapical abscess without sinus: Secondary | ICD-10-CM | POA: Insufficient documentation

## 2012-03-03 NOTE — ED Notes (Signed)
Patient is alert and oriented x3.  He is complaining of oral pain that started  Two days ago.  He was seen in the [facility] yesterday for the same issue  And was given hydrocodone and antibiotics with no relief.  He currently rates his pain  10 of 10. He states he has some nausea but no vomiting

## 2012-03-04 MED ORDER — BUPIVACAINE-EPINEPHRINE PF 0.5-1:200000 % IJ SOLN
1.8000 mL | Freq: Once | INTRAMUSCULAR | Status: AC
  Administered 2012-03-04: 9 mg
  Filled 2012-03-04: qty 1.8

## 2012-03-04 MED ORDER — OXYCODONE-ACETAMINOPHEN 10-325 MG PO TABS: 1.0000 | ORAL_TABLET | ORAL | Status: DC | PRN

## 2012-03-04 NOTE — ED Provider Notes (Signed)
History     CSN: [id number]  Arrival date & time 03/03/12  2041   First MD Initiated Contact with Patient 03/04/12 0039      Chief Complaint  Patient presents with  . Dental Pain   HPI  History provided by the patient. Patient is a 36 year old male with history of hypertension presents with complaints of continued and unrelieved left upper molar dental pains. Symptoms have been present for the past several days. He was seen yesterday in the emergency room given prescriptions for Vicodin and clindamycin. Patient has been taking these as prescribed states he has no relief of pain symptoms. Denies any fever, chills or sweats. Denies any difficulty breathing or swallowing. Denies any nausea vomiting symptoms. Patient also reports calling Dr. Ninetta Lights for which she was given a referral. He states he is not able to be seen until next week.     Past Medical History  Diagnosis Date  . Hypertension   . Wears glasses     Past Surgical History  Procedure Date  . Cholecystectomy     Family History  Problem Relation Age of Onset  . Cancer Mother     History  Substance Use Topics  . Smoking status: Current Every Day Smoker -- 1.0 packs/day    Types: Cigars  . Smokeless tobacco: Never Used  . Alcohol Use: Yes     Comment: occ      Review of Systems  Constitutional: Negative for fever and chills.  All other systems reviewed and are negative.    Allergies  Penicillins  Home Medications   Current Outpatient Rx  Name  Route  Sig  Dispense  Refill  . CLINDAMYCIN HCL 150 MG PO CAPS   Oral   Take 1 capsule (150 mg total) by mouth every 6 (six) hours.   28 capsule   0   . HYDROCHLOROTHIAZIDE 25 MG PO TABS   Oral   Take 1 tablet (25 mg total) by mouth daily.   30 tablet   1   . HYDROCODONE-ACETAMINOPHEN 5-325 MG PO TABS   Oral   Take 2 tablets by mouth every 6 (six) hours as needed for pain.   15 tablet   0   . LISINOPRIL 20 MG PO TABS   Oral   Take 1 tablet (20  mg total) by mouth daily.   30 tablet   3     BP 205/190  Pulse 69  Temp 97.7 F (36.5 C)  Resp 18  SpO2 99%  Physical Exam  Nursing note and vitals reviewed. Constitutional: He is oriented to person, place, and time. He appears well-developed and well-nourished. He appears distressed (uncomfortable, holding the left side of his face).  HENT:  Head: Normocephalic and atraumatic.  Mouth/Throat: Uvula is midline.         Significant dental decay throughout. Multiple teeth eroded to the gumline. Multiple dental caries.  Eyes: Conjunctivae normal are normal.  Neck: Normal range of motion. Neck supple.  Cardiovascular: Normal rate and regular rhythm.   Pulmonary/Chest: Effort normal and breath sounds normal. No respiratory distress. He exhibits no tenderness.  Abdominal: Soft.  Musculoskeletal: He exhibits no edema.  Lymphadenopathy:    He has no cervical adenopathy.  Neurological: He is alert and oriented to person, place, and time.  Skin: Skin is warm.    ED Course  Procedures   Dental Block Performed by: Angus Seller Authorized by: Angus Seller Consent: Verbal consent obtained. Risks and benefits: risks, benefits and  alternatives were discussed Consent given by: patient Patient identity confirmed: provided demographic data  Location: Left upper third molar  Local anesthetic: Bupivacaine 0.5% with epinephrine  Anesthetic total: 1.8 ml  Irrigation method: syringe  Patient tolerance: Patient tolerated the procedure well with no immediate complications. Pain improved.      1. Pain, dental   2. Periapical abscess   3. Dental caries       MDM  12:40AM patient seen and evaluated. Patient appears uncomfortable. Patient is hypertensive without any additional complaints or symptoms. Patient is currently taking lisinopril and hydrochlorothiazide admits to irregular use.           Angus Seller, [place] 03/04/12 [phone]

## 2012-03-04 NOTE — ED Provider Notes (Signed)
Medical screening examination/treatment/procedure(s) were conducted as a shared visit with non-physician practitioner(s) and myself.  I personally evaluated the patient during the encounter   [name] Racer, MD 03/04/12 [phone]

## 2012-03-15 ENCOUNTER — Encounter (HOSPITAL_COMMUNITY): Payer: Self-pay | Admitting: Emergency Medicine

## 2012-03-15 ENCOUNTER — Emergency Department (HOSPITAL_COMMUNITY)
Admission: EM | Admit: 2012-03-15 | Discharge: 2012-03-15 | Disposition: A | Discharge status: Home / Self Care | Payer: Self-pay | Attending: Emergency Medicine | Admitting: Emergency Medicine

## 2012-03-15 DIAGNOSIS — K0889 Other specified disorders of teeth and supporting structures: Secondary | ICD-10-CM

## 2012-03-15 DIAGNOSIS — K089 Disorder of teeth and supporting structures, unspecified: Secondary | ICD-10-CM | POA: Insufficient documentation

## 2012-03-15 DIAGNOSIS — Z79899 Other long term (current) drug therapy: Secondary | ICD-10-CM | POA: Insufficient documentation

## 2012-03-15 DIAGNOSIS — F17200 Nicotine dependence, unspecified, uncomplicated: Secondary | ICD-10-CM | POA: Insufficient documentation

## 2012-03-15 DIAGNOSIS — I10 Essential (primary) hypertension: Secondary | ICD-10-CM | POA: Insufficient documentation

## 2012-03-15 MED ORDER — OXYCODONE-ACETAMINOPHEN 5-325 MG PO TABS
2.0000 | ORAL_TABLET | Freq: Once | ORAL | Status: DC
  Filled 2012-03-15: qty 2

## 2012-03-15 MED ORDER — HYDROCHLOROTHIAZIDE 25 MG PO TABS: 25.0000 mg | ORAL_TABLET | Freq: Every day | ORAL | Status: DC

## 2012-03-15 MED ORDER — ERYTHROMYCIN BASE 500 MG PO TABS: 500.0000 mg | ORAL_TABLET | Freq: Three times a day (TID) | ORAL | Status: DC

## 2012-03-15 MED ORDER — OXYCODONE-ACETAMINOPHEN 10-325 MG PO TABS: 1.0000 | ORAL_TABLET | ORAL | Status: DC | PRN

## 2012-03-15 NOTE — ED Provider Notes (Signed)
History     CSN: [id number]  Arrival date & time 03/15/12  0556   First MD Initiated Contact with Patient 03/15/12 [phone]      Chief Complaint  Patient presents with  . Dental Problem    (Consider location/radiation/quality/duration/timing/severity/associated sxs/prior treatment) HPI Comments: Dental pain for the past two weeks.  Has appt with dentistry next week.  Was here for similar complaints and given clinda and percocet which he has completed and is still having pain and swelling is worsening.  Worse with eating or drinking.  No fevers or chills.  The history is provided by the patient.    Past Medical History  Diagnosis Date  . Hypertension   . Wears glasses     Past Surgical History  Procedure Date  . Cholecystectomy     Family History  Problem Relation Age of Onset  . Cancer Mother     History  Substance Use Topics  . Smoking status: Current Every Day Smoker -- 1.0 packs/day    Types: Cigars  . Smokeless tobacco: Never Used  . Alcohol Use: Yes     Comment: occ      Review of Systems  All other systems reviewed and are negative.    Allergies  Penicillins  Home Medications   Current Outpatient Rx  Name  Route  Sig  Dispense  Refill  . CLINDAMYCIN HCL 150 MG PO CAPS   Oral   Take 1 capsule (150 mg total) by mouth every 6 (six) hours.   28 capsule   0   . HYDROCHLOROTHIAZIDE 25 MG PO TABS   Oral   Take 1 tablet (25 mg total) by mouth daily.   30 tablet   1   . HYDROCODONE-ACETAMINOPHEN 5-325 MG PO TABS   Oral   Take 2 tablets by mouth every 6 (six) hours as needed for pain.   15 tablet   0   . LISINOPRIL 20 MG PO TABS   Oral   Take 1 tablet (20 mg total) by mouth daily.   30 tablet   3   . OXYCODONE-ACETAMINOPHEN 10-325 MG PO TABS   Oral   Take 1 tablet by mouth every 4 (four) hours as needed for pain.   15 tablet   0     BP 164/105  Pulse 75  Temp 98.3 F (36.8 C) (Oral)  Resp 16  Ht 6\' 2"  (1.88 m)  Wt 225 lb (102.059  kg)  BMI 28.89 kg/m2  SpO2 97%  Physical Exam  Nursing note and vitals reviewed. Constitutional: He appears well-developed and well-nourished. No distress.  HENT:  Head: Normocephalic and atraumatic.       There are several decayed and broken off teeth on the left lower jaw.  There is mild swelling and ttp of the left jaw.    Neck: Normal range of motion. Neck supple.  Lymphadenopathy:    He has no cervical adenopathy.  Neurological: He is alert.  Skin: Skin is warm and dry. He is not diaphoretic.    ED Course  Procedures (including critical care time)  Labs Reviewed - No data to display No results found.   No diagnosis found.    MDM  Will treat with erythromycin, percocet.        Geoffery Lyons, MD 03/15/12 [phone]

## 2012-03-15 NOTE — ED Notes (Signed)
Pt c/o toothache L side, unsure if upper or lower. Pt states he did finish all antbx given previously but pain continues.

## 2012-03-31 ENCOUNTER — Encounter (HOSPITAL_COMMUNITY): Payer: Self-pay | Admitting: *Deleted

## 2012-03-31 ENCOUNTER — Emergency Department (HOSPITAL_COMMUNITY)
Admission: EM | Admit: 2012-03-31 | Discharge: 2012-03-31 | Disposition: A | Discharge status: Home / Self Care | Payer: Self-pay | Attending: Emergency Medicine | Admitting: Emergency Medicine

## 2012-03-31 DIAGNOSIS — Z79899 Other long term (current) drug therapy: Secondary | ICD-10-CM | POA: Insufficient documentation

## 2012-03-31 DIAGNOSIS — H109 Unspecified conjunctivitis: Secondary | ICD-10-CM | POA: Insufficient documentation

## 2012-03-31 DIAGNOSIS — F17200 Nicotine dependence, unspecified, uncomplicated: Secondary | ICD-10-CM | POA: Insufficient documentation

## 2012-03-31 DIAGNOSIS — H5789 Other specified disorders of eye and adnexa: Secondary | ICD-10-CM | POA: Insufficient documentation

## 2012-03-31 DIAGNOSIS — I10 Essential (primary) hypertension: Secondary | ICD-10-CM | POA: Insufficient documentation

## 2012-03-31 MED ORDER — ERYTHROMYCIN 5 MG/GM OP OINT
TOPICAL_OINTMENT | Freq: Once | OPHTHALMIC | Status: AC
  Administered 2012-03-31: 05:00:00 via OPHTHALMIC
  Filled 2012-03-31: qty 3.5

## 2012-03-31 MED ORDER — FLUORESCEIN SODIUM 1 MG OP STRP
1.0000 | ORAL_STRIP | Freq: Once | OPHTHALMIC | Status: AC
  Administered 2012-03-31: 05:00:00 via OPHTHALMIC
  Filled 2012-03-31 (×2): qty 1

## 2012-03-31 MED ORDER — PROPARACAINE HCL 0.5 % OP SOLN
1.0000 [drp] | Freq: Once | OPHTHALMIC | Status: AC
  Administered 2012-03-31: 1 [drp] via OPHTHALMIC
  Filled 2012-03-31: qty 15

## 2012-03-31 NOTE — ED Provider Notes (Signed)
History     CSN: [id number]  Arrival date & time 03/31/12  0416   First MD Initiated Contact with Patient 03/31/12 [phone]      Chief Complaint  Patient presents with  . Eye Pain    (Consider location/radiation/quality/duration/timing/severity/associated sxs/prior treatment) HPI Comments: Patient noticed 3 day ago irritation in R eye tried Visine on Saturday with temporary relief but worse on Sunday, this AM lid swelling and purulent discharge as well as scleral injection  Denies injury known sick contacts   Patient is a 37 y.o. male presenting with eye pain. The history is provided by the patient.  Eye Pain This is a new problem. The current episode started in the past 7 days. The problem occurs constantly. The problem has been gradually worsening. Pertinent negatives include no chills, congestion, coughing, fever or weakness.    Past Medical History  Diagnosis Date  . Hypertension   . Wears glasses     Past Surgical History  Procedure Date  . Cholecystectomy     Family History  Problem Relation Age of Onset  . Cancer Mother     History  Substance Use Topics  . Smoking status: Current Every Day Smoker -- 1.0 packs/day    Types: Cigars  . Smokeless tobacco: Never Used  . Alcohol Use: Yes     Comment: occ      Review of Systems  Constitutional: Negative for fever and chills.  HENT: Negative for ear pain, congestion and rhinorrhea.   Eyes: Positive for pain, discharge and redness. Negative for photophobia.  Respiratory: Negative for cough.   Cardiovascular: Negative.   Genitourinary: Negative.   Neurological: Negative for weakness.    Allergies  Penicillins  Home Medications   Current Outpatient Rx  Name  Route  Sig  Dispense  Refill  . CLINDAMYCIN HCL 150 MG PO CAPS   Oral   Take 1 capsule (150 mg total) by mouth every 6 (six) hours.   28 capsule   0   . ERYTHROMYCIN BASE 500 MG PO TABS   Oral   Take 1 tablet (500 mg total) by mouth 3 (three)  times daily.   30 tablet   0   . HYDROCHLOROTHIAZIDE 25 MG PO TABS   Oral   Take 1 tablet (25 mg total) by mouth daily.   30 tablet   1   . HYDROCHLOROTHIAZIDE 25 MG PO TABS   Oral   Take 1 tablet (25 mg total) by mouth daily.   30 tablet   1   . HYDROCODONE-ACETAMINOPHEN 5-325 MG PO TABS   Oral   Take 2 tablets by mouth every 6 (six) hours as needed for pain.   15 tablet   0   . LISINOPRIL 20 MG PO TABS   Oral   Take 1 tablet (20 mg total) by mouth daily.   30 tablet   3   . OXYCODONE-ACETAMINOPHEN 10-325 MG PO TABS   Oral   Take 1 tablet by mouth every 4 (four) hours as needed for pain.   15 tablet   0   . OXYCODONE-ACETAMINOPHEN 10-325 MG PO TABS   Oral   Take 1 tablet by mouth every 4 (four) hours as needed for pain.   15 tablet   0     BP 194/119  Pulse 85  Temp 98.8 F (37.1 C) (Oral)  Resp 16  Ht 6\' 2"  (1.88 m)  Wt 220 lb (99.791 kg)  BMI 28.25 kg/m2  SpO2 99%  Physical Exam  Constitutional: He appears well-developed and well-nourished.  HENT:  Head: Normocephalic and atraumatic.  Eyes: EOM are normal. Pupils are equal, round, and reactive to light. Right eye exhibits discharge and exudate. Left eye exhibits no discharge. Right conjunctiva is injected. No scleral icterus.       No dye uptake   Neck: Normal range of motion.  Cardiovascular: Normal rate.   Pulmonary/Chest: Effort normal.  Musculoskeletal: Normal range of motion.  Lymphadenopathy:    He has no cervical adenopathy.  Neurological: He is alert.  Skin: Skin is warm and dry.    ED Course  Procedures (including critical care time)  Labs Reviewed - No data to display No results found.   No diagnosis found.    MDM   Will treat with Erythromycin ointment QID for 5 day and Opthalmologic referral        Arman Filter, NP 03/31/12 [phone]

## 2012-03-31 NOTE — ED Provider Notes (Signed)
Medical screening examination/treatment/procedure(s) were performed by non-physician practitioner and as supervising physician I was immediately available for consultation/collaboration.  Tobin Chad, MD 03/31/12 [phone]

## 2012-03-31 NOTE — ED Notes (Signed)
Pt presents w/ R eye pain, swelling, and redness. Pt states Friday had FB sensation to R eye but did not remove anything. Saturday worse, applied Visine and pain became worse.

## 2012-09-09 ENCOUNTER — Encounter (HOSPITAL_COMMUNITY): Payer: Self-pay | Admitting: Emergency Medicine

## 2012-09-09 ENCOUNTER — Emergency Department (HOSPITAL_COMMUNITY): Payer: Self-pay

## 2012-09-09 ENCOUNTER — Emergency Department (HOSPITAL_COMMUNITY)
Admission: EM | Admit: 2012-09-09 | Discharge: 2012-09-09 | Disposition: A | Discharge status: Home / Self Care | Payer: Self-pay | Attending: Emergency Medicine | Admitting: Emergency Medicine

## 2012-09-09 DIAGNOSIS — J069 Acute upper respiratory infection, unspecified: Secondary | ICD-10-CM | POA: Insufficient documentation

## 2012-09-09 DIAGNOSIS — F17200 Nicotine dependence, unspecified, uncomplicated: Secondary | ICD-10-CM | POA: Insufficient documentation

## 2012-09-09 DIAGNOSIS — Z880 Allergy status to penicillin: Secondary | ICD-10-CM | POA: Insufficient documentation

## 2012-09-09 DIAGNOSIS — R5381 Other malaise: Secondary | ICD-10-CM | POA: Insufficient documentation

## 2012-09-09 DIAGNOSIS — J00 Acute nasopharyngitis [common cold]: Secondary | ICD-10-CM | POA: Insufficient documentation

## 2012-09-09 DIAGNOSIS — IMO0001 Reserved for inherently not codable concepts without codable children: Secondary | ICD-10-CM | POA: Insufficient documentation

## 2012-09-09 DIAGNOSIS — I10 Essential (primary) hypertension: Secondary | ICD-10-CM | POA: Insufficient documentation

## 2012-09-09 DIAGNOSIS — J3489 Other specified disorders of nose and nasal sinuses: Secondary | ICD-10-CM | POA: Insufficient documentation

## 2012-09-09 DIAGNOSIS — R0982 Postnasal drip: Secondary | ICD-10-CM | POA: Insufficient documentation

## 2012-09-09 DIAGNOSIS — M79609 Pain in unspecified limb: Secondary | ICD-10-CM | POA: Insufficient documentation

## 2012-09-09 LAB — BASIC METABOLIC PANEL WITH GFR
BUN: 8 mg/dL (ref 6–23)
CO2: 29 meq/L (ref 19–32)
Calcium: 9.2 mg/dL (ref 8.4–10.5)
Chloride: 104 meq/L (ref 96–112)
Creatinine, Ser: 0.97 mg/dL (ref 0.50–1.35)
GFR calc Af Amer: >90 mL/min
GFR calc non Af Amer: >90 mL/min
Glucose, Bld: 96 mg/dL (ref 70–99)
Potassium: 3.5 meq/L (ref 3.5–5.1)
Sodium: 140 meq/L (ref 135–145)

## 2012-09-09 LAB — CBC
HCT: 48.6 % (ref 39.0–52.0)
Hemoglobin: 17.4 g/dL — ABNORMAL HIGH (ref 13.0–17.0)
MCH: 30.3 pg (ref 26.0–34.0)
MCHC: 35.8 g/dL (ref 30.0–36.0)
MCV: 84.5 fL (ref 78.0–100.0)
Platelets: 236 10*3/uL (ref 150–400)
RBC: 5.75 MIL/uL (ref 4.22–5.81)
RDW: 13.2 % (ref 11.5–15.5)
WBC: 8.8 10*3/uL (ref 4.0–10.5)

## 2012-09-09 MED ORDER — HYDROCODONE-HOMATROPINE 5-1.5 MG/5ML PO SYRP: 2.5000 mL | ORAL_SOLUTION | Freq: Four times a day (QID) | ORAL | Status: DC | PRN

## 2012-09-09 MED ORDER — LISINOPRIL 20 MG PO TABS: 20.0000 mg | ORAL_TABLET | Freq: Every day | ORAL | Status: DC

## 2012-09-09 MED ORDER — HYDROCHLOROTHIAZIDE 25 MG PO TABS: 25.0000 mg | ORAL_TABLET | Freq: Every day | ORAL | Status: DC

## 2012-09-09 MED ORDER — LISINOPRIL 20 MG PO TABS
20.0000 mg | ORAL_TABLET | Freq: Once | ORAL | Status: AC
  Administered 2012-09-09: 20 mg via ORAL
  Filled 2012-09-09: qty 1

## 2012-09-09 MED ORDER — SODIUM CHLORIDE 0.9 % IV BOLUS (SEPSIS)
1000.0000 mL | Freq: Once | INTRAVENOUS | Status: AC
  Administered 2012-09-09: 1000 mL via INTRAVENOUS

## 2012-09-09 MED ORDER — HYDROCHLOROTHIAZIDE 12.5 MG PO CAPS
12.5000 mg | ORAL_CAPSULE | Freq: Once | ORAL | Status: AC
  Administered 2012-09-09: 12.5 mg via ORAL
  Filled 2012-09-09: qty 1

## 2012-09-09 NOTE — ED Notes (Signed)
Pt states that he has been "feeling bad" x 3 days.  States that yesterday his right arm felt sore and heavy.  States that today he feels weak and states that his "stomach feels tight". C/o productive cough.

## 2012-09-09 NOTE — Progress Notes (Signed)
[facility] has seen patient and provided him with a list of primary care resources. °

## 2012-09-09 NOTE — ED Provider Notes (Signed)
History    CSN: [id number] Arrival date & time 09/09/12  1203  First MD Initiated Contact with Patient 09/09/12 1303     Chief Complaint  Patient presents with  . Weakness  . Cough  . Arm Pain   (Consider location/radiation/quality/duration/timing/severity/associated sxs/prior Treatment) HPI  SUBJECTIVE:  [name] is a 37 y.o. male who complains of coryza, congestion, post nasal drip, productive cough, myalgias and for 3 days. Patient   He denies a history of anorexia, chest pain, dizziness, fevers, nausea, rash , shortness of breath and vomiting and denies a history of asthma. Patient has been using Nyquil without relief. He is afraid he may have pneumonia.  Patient does smoke cigars. The patient also c/o right arm pain with movement. Began yesterday.  He has been out of his HTN medicaitons for the past 5 days.     Past Medical History  Diagnosis Date  . Hypertension   . Wears glasses    Past Surgical History  Procedure Laterality Date  . Cholecystectomy     Family History  Problem Relation Age of Onset  . Cancer Mother    History  Substance Use Topics  . Smoking status: Current Every Day Smoker -- 1.00 packs/day    Types: Cigars  . Smokeless tobacco: Never Used  . Alcohol Use: Yes     Comment: occ    Review of Systems Ten systems are reviewed and are negative for acute change except as noted in the HPI  Allergies  Penicillins  Home Medications   Current Outpatient Rx  Name  Route  Sig  Dispense  Refill  . hydrochlorothiazide (HYDRODIURIL) 25 MG tablet   Oral   Take 1 tablet (25 mg total) by mouth daily.   30 tablet   1   . lisinopril (PRINIVIL,ZESTRIL) 20 MG tablet   Oral   Take 1 tablet (20 mg total) by mouth daily.   30 tablet   3    BP 188/134  Pulse 77  Temp(Src) 99.2 F (37.3 C) (Oral)  Resp 22  Ht 6\' 2"  (1.88 m)  Wt 223 lb 3 oz (101.237 kg)  BMI 28.64 kg/m2  SpO2 99% Physical Exam Physical Exam  Nursing note and vitals  reviewed. Constitutional: He appears well-developed and well-nourished. No distress.  HENT:  Head: Normocephalic and atraumatic.  Eyes: Conjunctivae normal are normal. No scleral icterus.  Neck: Normal range of motion. Neck supple.  Cardiovascular: Normal rate, regular rhythm and normal heart sounds.   Pulmonary/Chest: Effort normal and breath sounds normal. Patient has productive cough. Abdominal: Soft. There is no tenderness.  Musculoskeletal: He exhibits no edema.  Neurological: He is alert.  Skin: Skin is warm and dry. He is not diaphoretic.  Psychiatric: His behavior is normal.    ED Course  Procedures (including critical care time) Labs Reviewed  CBC - Abnormal; Notable for the following:    Hemoglobin 17.4 (*)    All other components within normal limits  BASIC METABOLIC PANEL  POCT I-STAT TROPONIN I     Date: 09/09/2012  Rate: 79  Rhythm: normal sinus rhythm  QRS Axis: normal  Intervals: normal  ST/T Wave abnormalities: abnormal T  waves lateral leads  Conduction Disutrbances:none  Narrative Interpretation: Abnormal EKG with LVH  Old EKG Reviewed: unchanged   No results found. 1. URI (upper respiratory infection)     MDM  Patient with sxs concerning for URI vs. Pneu,monia. I am giving him his daily htn meds. He is  concerned about his heart given his htn and chest pain.  His EKG is abnormal but unchanged form his previous ekgs.  Filed Vitals:   09/09/12 1229 09/09/12 1543  BP: 188/134 187/136  Pulse: 77 73  Temp: 99.2 F (37.3 C) 97.9 F (36.6 C)  TempSrc: Oral Oral  Resp: 22 14  Height: 6\' 2"  (1.88 m)   Weight: 223 lb 3 oz (101.237 kg)   SpO2: 99% 99%    Patient given instructions to sop taking cold medicine containing phenylephrine. Continue to take htn meds and [facility]. Pt CXR negative for acute infiltrate. Patients symptoms are consistent with URI, likely viral etiology. Discussed that antibiotics are not indicated for  viral infections. Pt will be discharged with symptomatic treatment.  Verbalizes understanding and is agreeable with plan. Pt is hemodynamically stable & in NAD prior to dc.    Arthor Captain, PA-C 09/11/12 1302

## 2012-09-09 NOTE — ED Notes (Addendum)
Pt reports discomfort in his upper abdomen, sts "it feels tight". Pt denies n/v/, reports BM are yellow and sometimes has diarrhea. Pt also reports productive cough with yellow mucous, sts "I think I have a pneumonia"; pt also reports right arm pain. Pt reports these symptoms for 3-4 days

## 2012-09-12 NOTE — ED Provider Notes (Signed)
Medical screening examination/treatment/procedure(s) were performed by non-physician practitioner and as supervising physician I was immediately available for consultation/collaboration.   Richardean Canal, MD 09/12/12 [phone]

## 2012-10-21 ENCOUNTER — Emergency Department (HOSPITAL_COMMUNITY)
Admission: EM | Admit: 2012-10-21 | Discharge: 2012-10-21 | Disposition: A | Discharge status: Home / Self Care | Payer: Self-pay | Attending: Emergency Medicine | Admitting: Emergency Medicine

## 2012-10-21 ENCOUNTER — Encounter (HOSPITAL_COMMUNITY): Payer: Self-pay | Admitting: Emergency Medicine

## 2012-10-21 ENCOUNTER — Emergency Department (HOSPITAL_COMMUNITY): Payer: Self-pay

## 2012-10-21 DIAGNOSIS — Z880 Allergy status to penicillin: Secondary | ICD-10-CM | POA: Insufficient documentation

## 2012-10-21 DIAGNOSIS — I10 Essential (primary) hypertension: Secondary | ICD-10-CM | POA: Insufficient documentation

## 2012-10-21 DIAGNOSIS — M62838 Other muscle spasm: Secondary | ICD-10-CM | POA: Insufficient documentation

## 2012-10-21 DIAGNOSIS — F17200 Nicotine dependence, unspecified, uncomplicated: Secondary | ICD-10-CM | POA: Insufficient documentation

## 2012-10-21 DIAGNOSIS — Z79899 Other long term (current) drug therapy: Secondary | ICD-10-CM | POA: Insufficient documentation

## 2012-10-21 MED ORDER — DIAZEPAM 5 MG PO TABS: 5.0000 mg | ORAL_TABLET | Freq: Two times a day (BID) | ORAL | Status: DC

## 2012-10-21 MED ORDER — DIAZEPAM 5 MG PO TABS
10.0000 mg | ORAL_TABLET | Freq: Once | ORAL | Status: AC
  Administered 2012-10-21: 10 mg via ORAL
  Filled 2012-10-21: qty 2

## 2012-10-21 MED ORDER — KETOROLAC TROMETHAMINE 60 MG/2ML IM SOLN
60.0000 mg | Freq: Once | INTRAMUSCULAR | Status: AC
  Administered 2012-10-21: 60 mg via INTRAMUSCULAR
  Filled 2012-10-21: qty 2

## 2012-10-21 NOTE — ED Notes (Signed)
Pt c/o rt shoulder pain since Sunday.  Denies injury.

## 2012-10-21 NOTE — ED Provider Notes (Signed)
CSN: [id number]     Arrival date & time 10/21/12  1637 History    This chart was scribed for [name] Finner, PA-C, working with Claudean Kinds, MD by Frederik Pear, ED Scribe. This patient was seen in room WTR6/WTR6 and the patient's care was started at 1708.   First MD Initiated Contact with Patient 10/21/12 1708     Chief Complaint  Patient presents with  . Shoulder Pain   (Consider location/radiation/quality/duration/timing/severity/associated sxs/prior Treatment) The history is provided by the patient and medical records. No language interpreter was used.   HPI Comments: MORAD TAL is a 37 y.o. male who presents to the Emergency Department complaining of sudden onset, 10/10, constant right shoulder pain that began 3 days ago without injuries or trauma. He states the pain began as a crick in his neck, which he has experienced previously, but reports the symptoms did not resolve on its own as it has in the past. In the ED, he describes the pain as tight and sore. He treated the symptoms with ibuprofen today and yesterday without relief. He reports his job in [employer] requires some heavy lifting. He has a h/o of hypertension. Allergic to penicillin.   Past Medical History  Diagnosis Date  . Hypertension   . Wears glasses    Past Surgical History  Procedure Laterality Date  . Cholecystectomy     Family History  Problem Relation Age of Onset  . Cancer Mother    History  Substance Use Topics  . Smoking status: Current Every Day Smoker -- 1.00 packs/day    Types: Cigars  . Smokeless tobacco: Never Used  . Alcohol Use: Yes     Comment: occ    Review of Systems  Musculoskeletal: Positive for arthralgias (right shoulder).  All other systems reviewed and are negative.    Allergies  Penicillins  Home Medications   Current Outpatient Rx  Name  Route  Sig  Dispense  Refill  . hydrochlorothiazide (HYDRODIURIL) 25 MG tablet   Oral   Take 1 tablet (25 mg total) by  mouth daily.   30 tablet   1   . ibuprofen (ADVIL,MOTRIN) 200 MG tablet   Oral   Take 800 mg by mouth every 6 (six) hours as needed for pain.         Marland Kitchen lisinopril (PRINIVIL,ZESTRIL) 20 MG tablet   Oral   Take 1 tablet (20 mg total) by mouth daily.   30 tablet   3   . diazepam (VALIUM) 5 MG tablet   Oral   Take 1 tablet (5 mg total) by mouth 2 (two) times daily.   10 tablet   0    BP 155/88  Pulse 98  Temp(Src) 98.2 F (36.8 C) (Oral)  Resp 18  SpO2 96% Physical Exam  Nursing note and vitals reviewed. Constitutional: He is oriented to person, place, and time. He appears well-developed and well-nourished.  HENT:  Head: Normocephalic and atraumatic.  Eyes: EOM are normal.  Neck: Normal range of motion.  Cardiovascular: Normal rate.   Pulmonary/Chest: Effort normal.  Musculoskeletal: Normal range of motion. He exhibits tenderness.  Tenderness along the upper trapezius between his right shoulder and right neck. Palpable muscle spasm. Pain with neck rotation to the right. No ecchymosis, erythema, or deformity. Full ROM. NV intact.  Neurological: He is alert and oriented to person, place, and time.  Skin: Skin is warm and dry.  Psychiatric: He has a normal mood and affect. His behavior  is normal.    ED Course   Procedures (including critical care time)  DIAGNOSTIC STUDIES: Oxygen Saturation is 96% on room air, adequate by my interpretation.    COORDINATION OF CARE: 6:25 PM  Discussed planned course of treatment with the patient, who is agreeable at this time.  Labs Reviewed - No data to display Dg Shoulder Right  10/21/2012   *RADIOLOGY REPORT*  Clinical Data: Right shoulder and neck pain.  No known injury.  RIGHT SHOULDER - 2+ VIEW  Comparison:  None.  Findings:  There is no evidence of fracture or dislocation.  There is no evidence of arthropathy or other focal bone abnormality. Soft tissues are unremarkable.  IMPRESSION: Negative.   Original Report Authenticated  By: Myles Rosenthal, M.D.   1. Muscle spasms of neck     MDM  Pt had palpable muscle spasm of upper trapezius.  Arm is neurovascularly in tact and FROM of right shoulder.  Will tx with muscle relaxer and antiinflammatory.  Pt is to f/u with [facility] for ongoing shoulder pain.    I personally performed the services described in this documentation, which was scribed in my presence. The recorded information has been reviewed and is accurate.    [name] Finner, PA-C 10/21/12 1824  [name] Finner, PA-C 10/21/12 1825

## 2012-10-21 NOTE — ED Notes (Signed)
Pt states he is going to get a ride home.

## 2012-10-21 NOTE — ED Notes (Signed)
Pt ambulatory to exam room with steady gait.  

## 2012-10-24 NOTE — ED Provider Notes (Signed)
Medical screening examination/treatment/procedure(s) were performed by non-physician practitioner and as supervising physician I was immediately available for consultation/collaboration.   Kourtlynn Trevor Joseph Jaki Steptoe, MD 10/24/12 1049 

## 2012-10-25 ENCOUNTER — Encounter (HOSPITAL_COMMUNITY): Payer: Self-pay | Admitting: Emergency Medicine

## 2012-10-25 ENCOUNTER — Emergency Department (HOSPITAL_COMMUNITY)
Admission: EM | Admit: 2012-10-25 | Discharge: 2012-10-25 | Disposition: A | Discharge status: Home / Self Care | Payer: Self-pay | Attending: Emergency Medicine | Admitting: Emergency Medicine

## 2012-10-25 ENCOUNTER — Emergency Department (HOSPITAL_COMMUNITY): Payer: Self-pay

## 2012-10-25 DIAGNOSIS — Z880 Allergy status to penicillin: Secondary | ICD-10-CM | POA: Insufficient documentation

## 2012-10-25 DIAGNOSIS — Z79899 Other long term (current) drug therapy: Secondary | ICD-10-CM | POA: Insufficient documentation

## 2012-10-25 DIAGNOSIS — IMO0002 Reserved for concepts with insufficient information to code with codable children: Secondary | ICD-10-CM | POA: Insufficient documentation

## 2012-10-25 DIAGNOSIS — F17200 Nicotine dependence, unspecified, uncomplicated: Secondary | ICD-10-CM | POA: Insufficient documentation

## 2012-10-25 DIAGNOSIS — Z789 Other specified health status: Secondary | ICD-10-CM | POA: Insufficient documentation

## 2012-10-25 DIAGNOSIS — I10 Essential (primary) hypertension: Secondary | ICD-10-CM | POA: Insufficient documentation

## 2012-10-25 DIAGNOSIS — M542 Cervicalgia: Secondary | ICD-10-CM | POA: Insufficient documentation

## 2012-10-25 MED ORDER — PREDNISONE 20 MG PO TABS
60.0000 mg | ORAL_TABLET | Freq: Once | ORAL | Status: AC
  Administered 2012-10-25: 60 mg via ORAL
  Filled 2012-10-25: qty 3

## 2012-10-25 MED ORDER — HYDROCODONE-ACETAMINOPHEN 5-325 MG PO TABS: 1.0000 | ORAL_TABLET | Freq: Four times a day (QID) | ORAL | Status: DC | PRN

## 2012-10-25 MED ORDER — PREDNISONE 20 MG PO TABS: ORAL_TABLET | ORAL | Status: DC

## 2012-10-25 MED ORDER — HYDROCODONE-ACETAMINOPHEN 5-325 MG PO TABS
2.0000 | ORAL_TABLET | Freq: Once | ORAL | Status: AC
  Administered 2012-10-25: 2 via ORAL
  Filled 2012-10-25: qty 2

## 2012-10-25 NOTE — ED Provider Notes (Signed)
CSN: [id number]     Arrival date & time 10/25/12  2012 History     First MD Initiated Contact with Patient 10/25/12 2155     Chief Complaint  Patient presents with  . Shoulder Pain   (Consider location/radiation/quality/duration/timing/severity/associated sxs/prior Treatment) Patient is a 37 y.o. male presenting with shoulder pain. The history is provided by the patient.  Shoulder Pain Pertinent negatives include no chest pain, no abdominal pain, no headaches and no shortness of breath.  pt c/o right neck pain radiating towards left shoulder for the past week. Constant. Dull. Mod-severe. Worse w certain neck movements. States initially felt like may have slept funny/wrong on neck. Denies trauma or fall. No arm numbness/weakness or loss of normal function. No headache. No chest pain or sob. No skin changes, redness, or swelling to area. 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 Has tried nsaid and muscle relaxer without relief.     Past Medical History  Diagnosis Date  . Hypertension   . Wears glasses    Past Surgical History  Procedure Laterality Date  . Cholecystectomy     Family History  Problem Relation Age of Onset  . Cancer Mother    History  Substance Use Topics  . Smoking status: Current Every Day Smoker -- 1.00 packs/day    Types: Cigars  . Smokeless tobacco: Never Used  . Alcohol Use: Yes     Comment: occ    Review of Systems  Constitutional: Negative for fever and chills.  HENT: Positive for neck pain.   Eyes: Negative for redness.  Respiratory: Negative for shortness of breath.   Cardiovascular: Negative for chest pain.  Gastrointestinal: Negative for abdominal pain.  Genitourinary: Negative for flank pain.  Musculoskeletal: Negative for back pain.  Skin: Negative for rash.  Neurological: Negative for weakness, numbness and headaches.  Hematological: Does not bruise/bleed easily.  Psychiatric/Behavioral: Negative for confusion.    Allergies  Penicillins  Home  Medications   Current Outpatient Rx  Name  Route  Sig  Dispense  Refill  . diazepam (VALIUM) 5 MG tablet   Oral   Take 5 mg by mouth every 12 (twelve) hours as needed for anxiety.         . hydrochlorothiazide (HYDRODIURIL) 25 MG tablet   Oral   Take 25 mg by mouth every morning.         Marland Kitchen HYDROcodone-acetaminophen (NORCO/VICODIN) 5-325 MG per tablet   Oral   Take 1-2 tablets by mouth every 6 (six) hours as needed for pain.         Marland Kitchen lisinopril (PRINIVIL,ZESTRIL) 20 MG tablet   Oral   Take 20 mg by mouth every morning.          BP 160/105  Pulse 73  Temp(Src) 98.2 F (36.8 C) (Oral)  Resp 18  Ht 6\' 2"  (1.88 m)  Wt 225 lb (102.059 kg)  BMI 28.88 kg/m2  SpO2 97% Physical Exam  Nursing note and vitals reviewed. Constitutional: He is oriented to person, place, and time. He appears well-developed and well-nourished. No distress.  HENT:  Head: Atraumatic.  Eyes: Conjunctivae are normal.  Neck: Normal range of motion. Neck supple. No tracheal deviation present.  Cardiovascular: Normal rate, regular rhythm, normal heart sounds and intact distal pulses.  Exam reveals no gallop and no friction rub.   No murmur heard. Pulmonary/Chest: Effort normal and breath sounds normal. No accessory muscle usage. No respiratory distress.  Abdominal: Soft. He exhibits no distension. There is no tenderness.  Musculoskeletal: Normal range of motion.  Right posterior neck and right trap muscular tenderness. CT spine non tender, spine aligned, no step off.  No sts. No skin lesions or erythema. w lateral rotation neck esp to right and turning/tilting head to right notes increased pain.   Neurological: He is alert and oriented to person, place, and time.  Motor intact, 5/5 bil. R/u/m nerve fxn RUE intact.   Skin: Skin is warm and dry. He is not diaphoretic.  Psychiatric: He has a normal mood and affect.    ED Course   Procedures (including critical care time)  Dg Cervical Spine  Complete  10/25/2012   *RADIOLOGY REPORT*  Clinical Data: 37 year old male with right side pain radiating to the shoulder.  CERVICAL SPINE - COMPLETE 4+ VIEW  Comparison: None.  Findings: Normal prevertebral soft tissue contour. Cervicothoracic junction alignment is within normal limits.  Relatively preserved disc spaces.  Multilevel mild endplate spurring. Bilateral posterior element alignment is within normal limits.  Mild scoliosis, otherwise normal AP alignment.  Negative lung apices. C1-C2 alignment and odontoid within normal limits.  IMPRESSION: Mild cervical spine degenerative endplate spurring. No acute fracture or listhesis identified in the cervical spine. Ligamentous injury is not excluded.   Original Report Authenticated By: Erskine Speed, M.D.   Dg Shoulder Right  10/21/2012   *RADIOLOGY REPORT*  Clinical Data: Right shoulder and neck pain.  No known injury.  RIGHT SHOULDER - 2+ VIEW  Comparison:  None.  Findings:  There is no evidence of fracture or dislocation.  There is no evidence of arthropathy or other focal bone abnormality. Soft tissues are unremarkable.  IMPRESSION: Negative.   Original Report Authenticated By: Myles Rosenthal, M.D.      MDM  Pt states has a ride, does not have to drive home. No meds pta.   vicodin po.  Xr.  Reviewed nursing notes and prior charts for additional history.   As pt notes no relief w prior meds, hx ddd. Will give rx pred and norco. Discussed pcp follow up 1 week, possible imaging/mr if symptoms persist/worsen.   Also pcp follow up for htn.  Recheck pt comfortable.   Appears stable for d/c.     Suzi Roots, MD 10/25/12 2242

## 2012-10-25 NOTE — ED Notes (Signed)
Patient reports that he continues to have pain to his right shoulder and neck. His pain medications that he was given here have not been working

## 2012-10-27 ENCOUNTER — Emergency Department (HOSPITAL_BASED_OUTPATIENT_CLINIC_OR_DEPARTMENT_OTHER)
Admission: EM | Admit: 2012-10-27 | Discharge: 2012-10-27 | Disposition: A | Discharge status: Home / Self Care | Payer: Self-pay | Attending: Emergency Medicine | Admitting: Emergency Medicine

## 2012-10-27 ENCOUNTER — Encounter (HOSPITAL_BASED_OUTPATIENT_CLINIC_OR_DEPARTMENT_OTHER): Payer: Self-pay | Admitting: *Deleted

## 2012-10-27 DIAGNOSIS — R110 Nausea: Secondary | ICD-10-CM | POA: Insufficient documentation

## 2012-10-27 DIAGNOSIS — R61 Generalized hyperhidrosis: Secondary | ICD-10-CM | POA: Insufficient documentation

## 2012-10-27 DIAGNOSIS — M542 Cervicalgia: Secondary | ICD-10-CM | POA: Insufficient documentation

## 2012-10-27 DIAGNOSIS — F17200 Nicotine dependence, unspecified, uncomplicated: Secondary | ICD-10-CM | POA: Insufficient documentation

## 2012-10-27 DIAGNOSIS — Z880 Allergy status to penicillin: Secondary | ICD-10-CM | POA: Insufficient documentation

## 2012-10-27 DIAGNOSIS — I10 Essential (primary) hypertension: Secondary | ICD-10-CM | POA: Insufficient documentation

## 2012-10-27 DIAGNOSIS — R55 Syncope and collapse: Secondary | ICD-10-CM | POA: Insufficient documentation

## 2012-10-27 DIAGNOSIS — R5381 Other malaise: Secondary | ICD-10-CM | POA: Insufficient documentation

## 2012-10-27 LAB — GLUCOSE, CAPILLARY: Glucose-Capillary: 139 mg/dL — ABNORMAL HIGH (ref 70–99)

## 2012-10-27 MED ORDER — SODIUM CHLORIDE 0.9 % IV BOLUS (SEPSIS)
1000.0000 mL | Freq: Once | INTRAVENOUS | Status: AC
  Administered 2012-10-27: 1000 mL via INTRAVENOUS

## 2012-10-27 NOTE — ED Notes (Signed)
Pt reports went to [facility] for BM and become weak and lightheaded with SOB

## 2012-10-27 NOTE — ED Notes (Signed)
CBG 139.  A Adkins notified

## 2012-10-27 NOTE — ED Provider Notes (Signed)
CSN: [id number]     Arrival date & time 10/27/12  1841 History     This chart was scribed for [name] Cisco, MD by Jiles Prows, ED Scribe. The patient was seen in room MH10/MH10 and the patient's care was started at 6:57 PM.    Chief Complaint  Patient presents with  . Shortness of Breath   Patient is a 37 y.o. male presenting with shortness of breath and syncope. The history is provided by the patient and medical records. No language interpreter was used.  Shortness of Breath Associated symptoms: neck pain and syncope   Associated symptoms: no abdominal pain, no chest pain, no cough, no fever, no headaches, no rash and no vomiting   Loss of Consciousness Episode history:  Single (near syncope) Most recent episode:  Today Progression:  Resolved Chronicity:  New Context: bowel movement   Witnessed: no   Relieved by: unsure. Worsened by:  Posture Ineffective treatments:  None tried Associated symptoms: shortness of breath   Associated symptoms: no chest pain, no confusion, no dizziness, no fever, no headaches, no nausea, no vomiting and no weakness    HPI Comments: [name] is a 37 y.o. male with a h/o HTN who presents to the Emergency Department complaining of episode of SOB, light headedness, and diaphoresis onset 20 minutes ago.  Pt currently reports feeling tight stomach with hand cramping.  He states that this evening he was eating at a [employer] and went to the bathroom for a BM.  He states that he was straining to move his bowels when he had a cold sweat and felt very light headed.  Pt reports that the stool was loose.  He states that there was associated SOB, weakness, and nausea.  He denies chest pain and changes to vision.  He states that he ate pizza for dinner prior to this event.  Pt denies h/o blood sugar issues.  Pt denies headache, diaphoresis, fever, chills, vomiting, coughand any other pain.  He claims his blood pressure is normally 135/102.  It is currently  118/72.  He states he has never seen it this low.  Pt also complains of intermittent moderate week-long pain to right side of neck that was treated at [facility].  He states he was seen twice at [facility] for this issue and written for Vicodin.  He states that "doing too much" exacerbates this pain, but that it is constant   Past Medical History  Diagnosis Date  . Hypertension   . Wears glasses    Past Surgical History  Procedure Laterality Date  . Cholecystectomy     Family History  Problem Relation Age of Onset  . Cancer Mother    History  Substance Use Topics  . Smoking status: Current Every Day Smoker -- 1.00 packs/day    Types: Cigars  . Smokeless tobacco: Never Used  . Alcohol Use: Yes     Comment: occ    Review of Systems  Constitutional: Negative for fever, activity change, appetite change and fatigue.  HENT: Positive for neck pain. Negative for congestion, facial swelling, rhinorrhea, trouble swallowing and neck stiffness.   Eyes: Negative for photophobia and pain.  Respiratory: Positive for shortness of breath. Negative for cough and chest tightness.   Cardiovascular: Positive for syncope. Negative for chest pain and leg swelling.  Gastrointestinal: Negative for nausea, vomiting, abdominal pain, diarrhea and constipation.  Endocrine: Negative for polydipsia and polyuria.  Genitourinary: Negative for dysuria, urgency, decreased urine volume  and difficulty urinating.  Musculoskeletal: Negative for back pain and gait problem.  Skin: Negative for color change, rash and wound.  Allergic/Immunologic: Negative for immunocompromised state.  Neurological: Positive for light-headedness. Negative for dizziness, facial asymmetry, speech difficulty, weakness, numbness and headaches.  Psychiatric/Behavioral: Negative for confusion, decreased concentration and agitation.    Allergies  Penicillins  Home Medications   Current Outpatient Rx  Name  Route  Sig  Dispense   Refill  . diazepam (VALIUM) 5 MG tablet   Oral   Take 5 mg by mouth every 12 (twelve) hours as needed for anxiety.         . hydrochlorothiazide (HYDRODIURIL) 25 MG tablet   Oral   Take 25 mg by mouth every morning.         Marland Kitchen HYDROcodone-acetaminophen (NORCO/VICODIN) 5-325 MG per tablet   Oral   Take 1-2 tablets by mouth every 6 (six) hours as needed for pain.         Marland Kitchen HYDROcodone-acetaminophen (NORCO/VICODIN) 5-325 MG per tablet   Oral   Take 1-2 tablets by mouth every 6 (six) hours as needed for pain.   20 tablet   0   . lisinopril (PRINIVIL,ZESTRIL) 20 MG tablet   Oral   Take 20 mg by mouth every morning.         . predniSONE (DELTASONE) 20 MG tablet      3 po once a day for 2 days, then 2 po once a day for 3 days, then 1 po once a day for 3 days   15 tablet   0    BP 118/72  Pulse 65  Temp(Src) 98.6 F (37 C) (Oral)  Resp 16  Ht 6\' 2"  (1.88 m)  Wt 225 lb (102.059 kg)  BMI 28.88 kg/m2  SpO2 100% Physical Exam  Constitutional: He is oriented to person, place, and time. He appears well-developed and well-nourished. No distress.  HENT:  Head: Normocephalic and atraumatic.  Mouth/Throat: No oropharyngeal exudate.  Eyes: Pupils are equal, round, and reactive to light.  Neck: Neck supple. Muscular tenderness present. No spinous process tenderness present. No rigidity. Decreased range of motion present. No edema present.    Cardiovascular: Normal rate, regular rhythm and normal heart sounds.  Exam reveals no gallop and no friction rub.   No murmur heard. Pulmonary/Chest: Effort normal and breath sounds normal. No respiratory distress. He has no wheezes. He has no rales.  No crackles.  Abdominal: Soft. Bowel sounds are normal. He exhibits no distension and no mass. There is no tenderness. There is no rebound and no guarding.  Musculoskeletal: He exhibits no edema and no tenderness.  Tenderness to right trapezius paraspinal muscles.  Non tender rounded density  in right anterior thigh and right posterior upper arm.  Neurological: He is alert and oriented to person, place, and time. He displays no tremor. No cranial nerve deficit or sensory deficit. He exhibits normal muscle tone. Coordination and gait normal. GCS eye subscore is 4. GCS verbal subscore is 5. GCS motor subscore is 6.  Skin: Skin is warm and dry.  Psychiatric: He has a normal mood and affect.    ED Course   Procedures (including critical care time) DIAGNOSTIC STUDIES: Filed Vitals:   10/27/12 1851 10/27/12 1945 10/27/12 1946 10/27/12 1947  BP:  106/54 108/66 112/60  Pulse:  64 70 74  Temp:      TempSrc:      Resp:      Height:  Weight:      SpO2: 100%      COORDINATION OF CARE: 7:05 PM - Discussed ED treatment with pt at bedside including checking blood sugar, and IV fluids and pt agrees.   Labs Reviewed  GLUCOSE, CAPILLARY - Abnormal; Notable for the following:    Glucose-Capillary 139 (*)    All other components within normal limits   Dg Cervical Spine Complete  10/25/2012   *RADIOLOGY REPORT*  Clinical Data: 37 year old male with right side pain radiating to the shoulder.  CERVICAL SPINE - COMPLETE 4+ VIEW  Comparison: None.  Findings: Normal prevertebral soft tissue contour. Cervicothoracic junction alignment is within normal limits.  Relatively preserved disc spaces.  Multilevel mild endplate spurring. Bilateral posterior element alignment is within normal limits.  Mild scoliosis, otherwise normal AP alignment.  Negative lung apices. C1-C2 alignment and odontoid within normal limits.  IMPRESSION: Mild cervical spine degenerative endplate spurring. No acute fracture or listhesis identified in the cervical spine. Ligamentous injury is not excluded.   Original Report Authenticated By: Erskine Speed, M.D.   No diagnosis found.   Date: 10/28/2012  Rate: 68  Rhythm: normal sinus rhythm  QRS Axis: normal  Intervals: normal  ST/T Wave abnormalities: nonspecific ST/T  changes with TWI i, II, III, aVF, V4-6,  Conduction Disutrbances:L atrial abnormality, PVCs  Narrative Interpretation:   Old EKG Reviewed: changes noted, TWI now more pronounced in V4    MDM   Pt is a 37 y.o. male with Pmhx as above who presents with sudden onset diaphoresis, lightheadedness, SOB while having a BM in a restaurant after dinner just PTA.  No palpitations, CP, ab pain.  Pt currently feeling improved.  HDS.  No focal neuro findings.  EKG with diffuse TWI as seen prior, except for more pronounced TWI in V4.  Pt felt improved after 1L NS, was able to ambulated around dept and tolerate soda.  Given pt having BM during symptoms onset, I believe symptoms were vasovagal induced.  Doubt cardiogenic syncope, CVA/TIA.  Return precautions given for new or worsening symptoms including recurrent symptoms, CP, SOB, palpitations.   1. Vasovagal near syncope     I personally performed the services described in this documentation, which was scribed in my presence. The recorded information has been reviewed and is accurate.   [name] Cisco, MD 10/28/12 [phone]

## 2012-10-27 NOTE — ED Notes (Signed)
Tolerated ambulation well HR 82 O2 sat 97% denies pain or dizziness. Alert x4 skin w/d

## 2013-04-13 ENCOUNTER — Emergency Department (HOSPITAL_COMMUNITY)
Admission: EM | Admit: 2013-04-13 | Discharge: 2013-04-14 | Disposition: A | Discharge status: Home / Self Care | Payer: Self-pay | Attending: Emergency Medicine | Admitting: Emergency Medicine

## 2013-04-13 ENCOUNTER — Encounter (HOSPITAL_COMMUNITY): Payer: Self-pay | Admitting: Emergency Medicine

## 2013-04-13 DIAGNOSIS — Z79899 Other long term (current) drug therapy: Secondary | ICD-10-CM | POA: Insufficient documentation

## 2013-04-13 DIAGNOSIS — F17200 Nicotine dependence, unspecified, uncomplicated: Secondary | ICD-10-CM | POA: Insufficient documentation

## 2013-04-13 DIAGNOSIS — Z880 Allergy status to penicillin: Secondary | ICD-10-CM | POA: Insufficient documentation

## 2013-04-13 DIAGNOSIS — K029 Dental caries, unspecified: Secondary | ICD-10-CM | POA: Insufficient documentation

## 2013-04-13 DIAGNOSIS — I10 Essential (primary) hypertension: Secondary | ICD-10-CM | POA: Insufficient documentation

## 2013-04-13 MED ORDER — TRAMADOL HCL 50 MG PO TABS
50.0000 mg | ORAL_TABLET | Freq: Once | ORAL | Status: AC
Start: 2013-04-13 — End: 2013-04-14
  Administered 2013-04-14: 50 mg via ORAL
  Filled 2013-04-13: qty 1

## 2013-04-13 MED ORDER — CLINDAMYCIN HCL 300 MG PO CAPS
300.0000 mg | ORAL_CAPSULE | Freq: Once | ORAL | Status: AC
  Administered 2013-04-14: 300 mg via ORAL
  Filled 2013-04-13: qty 1

## 2013-04-13 MED ORDER — CLINDAMYCIN HCL 150 MG PO CAPS: 150.0000 mg | ORAL_CAPSULE | Freq: Four times a day (QID) | ORAL | Status: DC

## 2013-04-13 MED ORDER — TRAMADOL HCL 50 MG PO TABS: 50.0000 mg | ORAL_TABLET | Freq: Four times a day (QID) | ORAL | Status: DC | PRN

## 2013-04-13 NOTE — ED Provider Notes (Signed)
CSN: [id number]     Arrival date & time 04/13/13  2314 History  This chart was scribed for non-physician practitioner Dominga Ferry working with Wynetta Fines, MD by Eston Mould, ED Scribe. This patient was seen in room WTR8/WTR8 and the patient's care was started at 11:26 PM .   Chief Complaint  Patient presents with  . Dental Pain   The history is provided by the patient.   HPI Comments: [name] is a 38 y.o. male who presents to the Emergency Department complaining of ongoing worsening L sided dental pain that began 4 days ago. Pt states he has been doing peroxide gargles and taking OTC Ibuprofen without relief. Pt denies seeing a dentist due to not having dental insurance.   Past Medical History  Diagnosis Date  . Hypertension   . Wears glasses    Past Surgical History  Procedure Laterality Date  . Cholecystectomy     Family History  Problem Relation Age of Onset  . Cancer Mother    History  Substance Use Topics  . Smoking status: Current Every Day Smoker -- 1.00 packs/day    Types: Cigars  . Smokeless tobacco: Never Used  . Alcohol Use: No     Comment: occ    Review of Systems  Constitutional: Negative for fever and chills.  HENT: Positive for dental problem. Negative for trouble swallowing.   Neurological: Negative for headaches.  All other systems reviewed and are negative.    Allergies  Penicillins  Home Medications   Current Outpatient Rx  Name  Route  Sig  Dispense  Refill  . hydrochlorothiazide (HYDRODIURIL) 25 MG tablet   Oral   Take 25 mg by mouth every morning.         Marland Kitchen ibuprofen (ADVIL,MOTRIN) 200 MG tablet   Oral   Take 800 mg by mouth every 6 (six) hours as needed (pain).         Marland Kitchen lisinopril (PRINIVIL,ZESTRIL) 20 MG tablet   Oral   Take 20 mg by mouth every morning.         . clindamycin (CLEOCIN) 150 MG capsule   Oral   Take 1 capsule (150 mg total) by mouth every 6 (six) hours.   28 capsule   0   .  traMADol (ULTRAM) 50 MG tablet   Oral   Take 1 tablet (50 mg total) by mouth every 6 (six) hours as needed.   30 tablet   0    BP 210/125  Pulse 73  Temp(Src) 97.9 F (36.6 C) (Oral)  Resp 20  Ht 6\' 2"  (1.88 m)  Wt 225 lb (102.059 kg)  BMI 28.88 kg/m2  SpO2 98%  Physical Exam  Nursing note and vitals reviewed. Constitutional: He appears well-developed and well-nourished.  HENT:  Head: Normocephalic.  Mouth/Throat: Uvula is midline and oropharynx is clear and moist.  1st,2nd,3rd mollars are all decayed into the gum. Upper 2nd and 3rd molar with gum swelling.  Eyes: Pupils are equal, round, and reactive to light.  Neck: Normal range of motion.  Cardiovascular: Normal rate.   Pulmonary/Chest: Effort normal.  Abdominal: Soft.  Skin: Skin is warm and dry.    ED Course  Procedures  DIAGNOSTIC STUDIES: Oxygen Saturation is 98% on RA, normal by my interpretation.    COORDINATION OF CARE: 11:28 PM-Discussed treatment plan which includes discharge pt with antibiotics and administer pain medication while in ED. Will refer pt to dentist. Pt agreed to plan.   Labs  Review Labs Reviewed - No data to display Imaging Review No results found.  EKG Interpretation   None       MDM   1. Dental decay       I personally performed the services described in this documentation, which was scribed in my presence. The recorded information has been reviewed and is accurate.    Garald Balding, NP 04/14/13 1954

## 2013-04-13 NOTE — Discharge Instructions (Signed)
Dental Caries  Dental caries (also called tooth decay) is the most common oral disease. It can occur at any age, but is more common in children and young adults.  HOW DENTAL CARIES DEVELOPS  The process of decay begins when bacteria and foods (particularly sugars and starches) combine in your mouth to produce plaque. Plaque is a substance that sticks to the hard, outer surface of a tooth (enamel). The bacteria in plaque produce acids that attack enamel. These acids may also attack the root surface of a tooth (cementum) if it is exposed. Repeated attacks dissolve these surfaces and create holes in the tooth (cavities). If left untreated, the acids destroy the other layers of the tooth.  RISK FACTORS  Frequent sipping of sugary beverages.   Frequent snacking on sugary and starchy foods, especially those that easily get stuck in the teeth.   Poor oral hygiene.   Dry mouth.   Substance abuse such as methamphetamine abuse.   Broken or poor-fitting dental restorations.   Eating disorders.   Gastroesophageal reflux disease (GERD).   Certain radiation treatments to the head and neck. SYMPTOMS In the early stages of dental caries, symptoms are seldom present. Sometimes white, chalky areas may be seen on the enamel or other tooth layers. In later stages, symptoms may include:  Pits and holes on the enamel.  Toothache after sweet, hot, or cold foods or drinks are consumed.  Pain around the tooth.  Swelling around the tooth. DIAGNOSIS  Most of the time, dental caries is detected during a regular dental checkup. A diagnosis is made after a thorough medical and dental history is taken and the surfaces of your teeth are checked for signs of dental caries. Sometimes special instruments, such as lasers, are used to check for dental caries. Dental X-ray exams may be taken so that areas not visible to the eye (such as between the contact areas of the teeth) can be checked for cavities.    TREATMENT  If dental caries is in its early stages, it may be reversed with a fluoride treatment or an application of a remineralizing agent at the dental office. Thorough brushing and flossing at home is needed to aid these treatments. If it is in its later stages, treatment depends on the location and extent of tooth destruction:   If a small area of the tooth has been destroyed, the destroyed area will be removed and cavities will be filled with a material such as gold, silver amalgam, or composite resin.   If a large area of the tooth has been destroyed, the destroyed area will be removed and a cap (crown) will be fitted over the remaining tooth structure.   If the center part of the tooth (pulp) is affected, a procedure called a root canal will be needed before a filling or crown can be placed.   If most of the tooth has been destroyed, the tooth may need to be pulled (extracted). HOME CARE INSTRUCTIONS You can prevent, stop, or reverse dental caries at home by practicing good oral hygiene. Good oral hygiene includes:  Thoroughly cleaning your teeth at least twice a day with a toothbrush and dental floss.   Using a fluoride toothpaste. A fluoride mouth rinse may also be used if recommended by your dentist or health care provider.   Restricting the amount of sugary and starchy foods and sugary liquids you consume.   Avoiding frequent snacking on these foods and sipping of these liquids.   Keeping regular visits  with a dentist for checkups and cleanings. PREVENTION   Practice good oral hygiene.  Consider a dental sealant. A dental sealant is a coating material that is applied by your dentist to the pits and grooves of teeth. The sealant prevents food from being trapped in them. It may protect the teeth for several years.  Ask about fluoride supplements if you live in a community without fluorinated water or with water that has a low fluoride content. Use fluoride supplements  as directed by your dentist or health care provider.  Allow fluoride varnish applications to teeth if directed by your dentist or health care provider. Document Released: 11/18/2001 Document Revised: 10/29/2012 Document Reviewed: 02/29/2012 [facility] Patient Information 2014 [place]. Please call Dr. Talbert Forest first thing in the morning telling them you were sent to the emergency department

## 2013-04-13 NOTE — ED Notes (Signed)
Pt reports having tooth ache (left molar) since last Friday. States he has been taking Motrin and tylenol for pain. Pt denies going to see dentist.

## 2013-04-15 NOTE — ED Provider Notes (Signed)
Medical screening examination/treatment/procedure(s) were performed by non-physician practitioner and as supervising physician I was immediately available for consultation/collaboration.  EKG Interpretation   None         Wynetta Fines, MD 04/15/13 [phone]

## 2013-10-17 IMAGING — CR DG SHOULDER 2+V*R*
3 series · 3 of 3 positions shown · non-contrast
Comparison: None.

CLINICAL DATA: Right shoulder and neck pain.  No known injury.

RIGHT SHOULDER - 2+ VIEW

[w shoulder internal right]
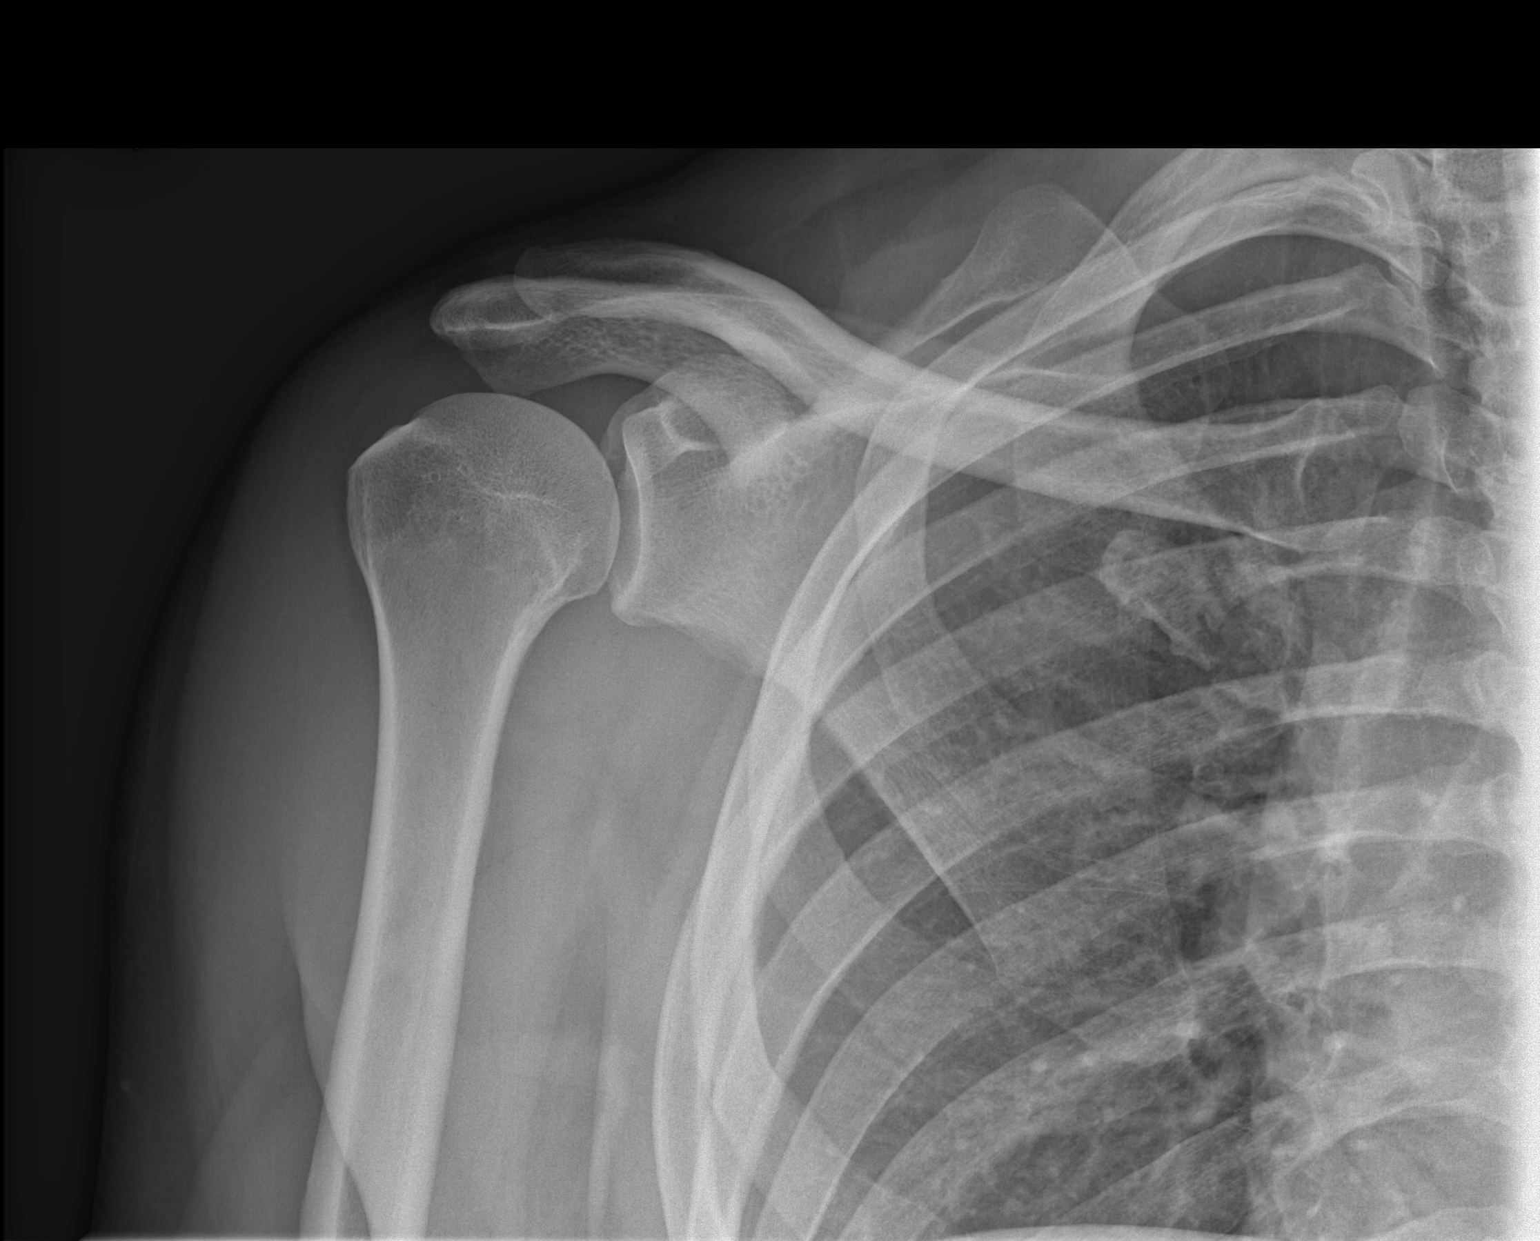

[w shoulder y-view right]
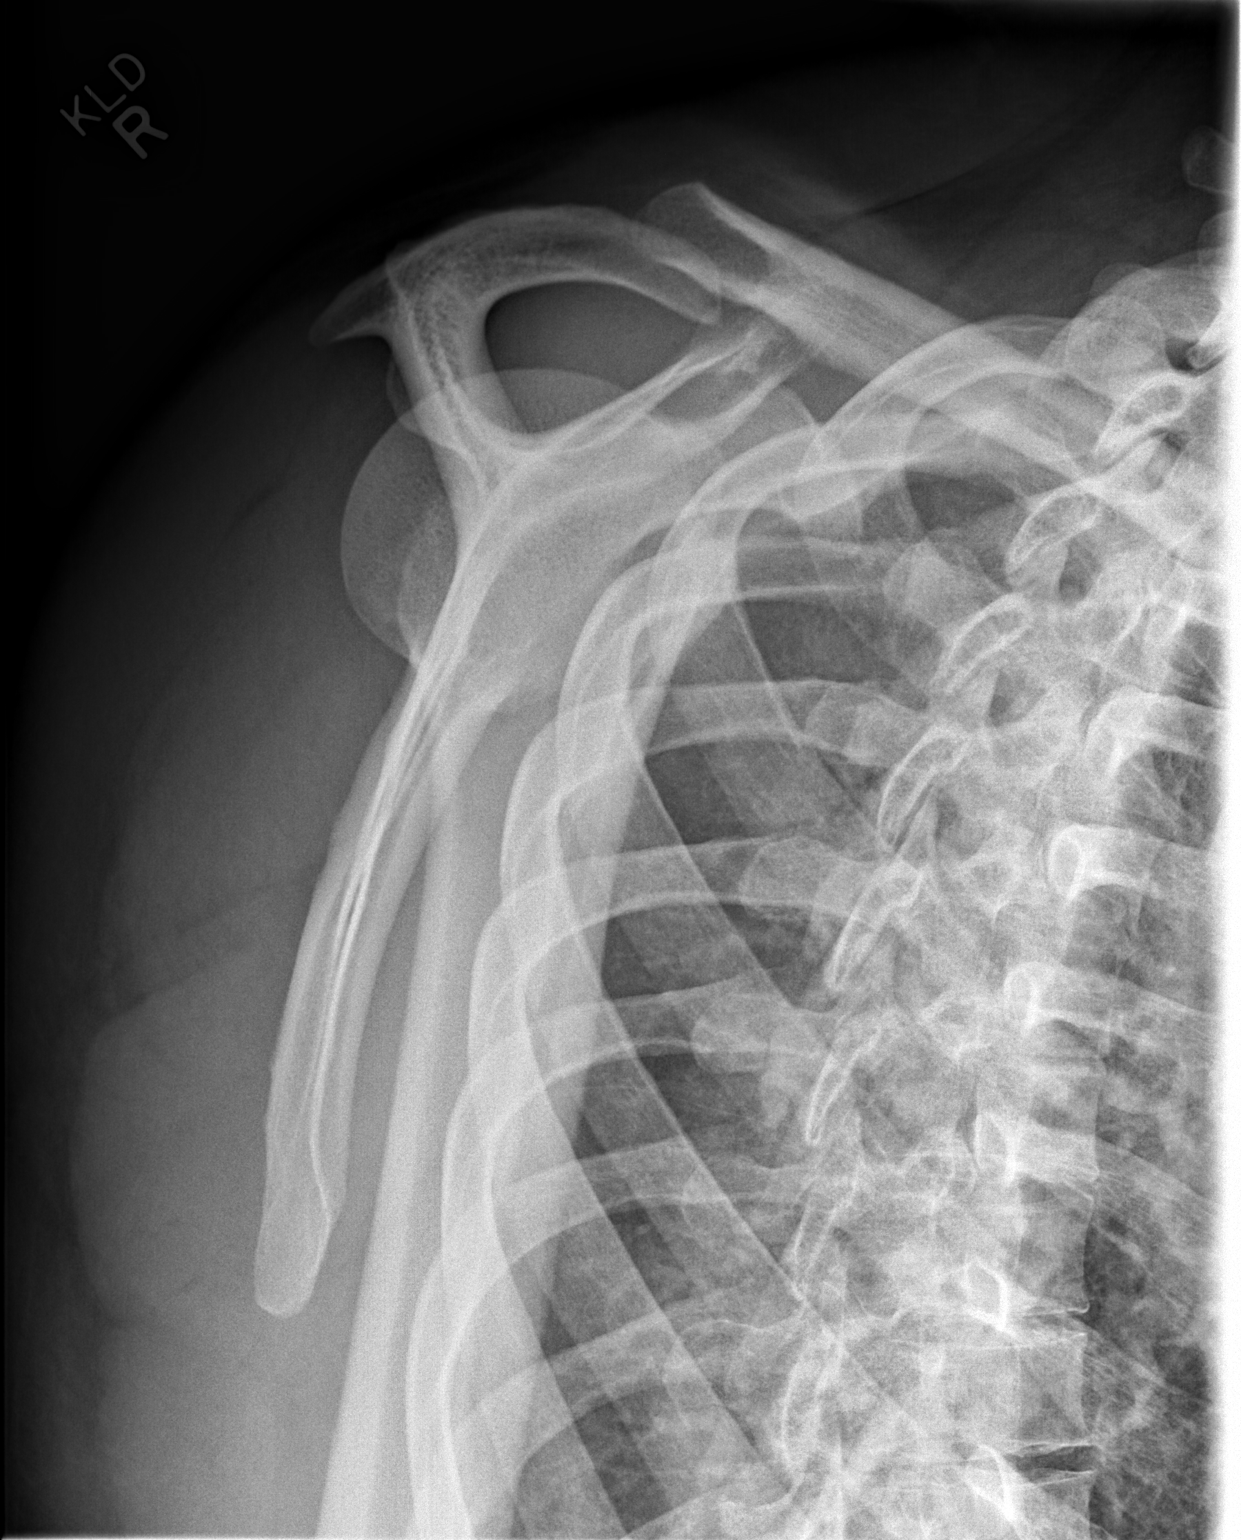

[x shoulder axillary right]
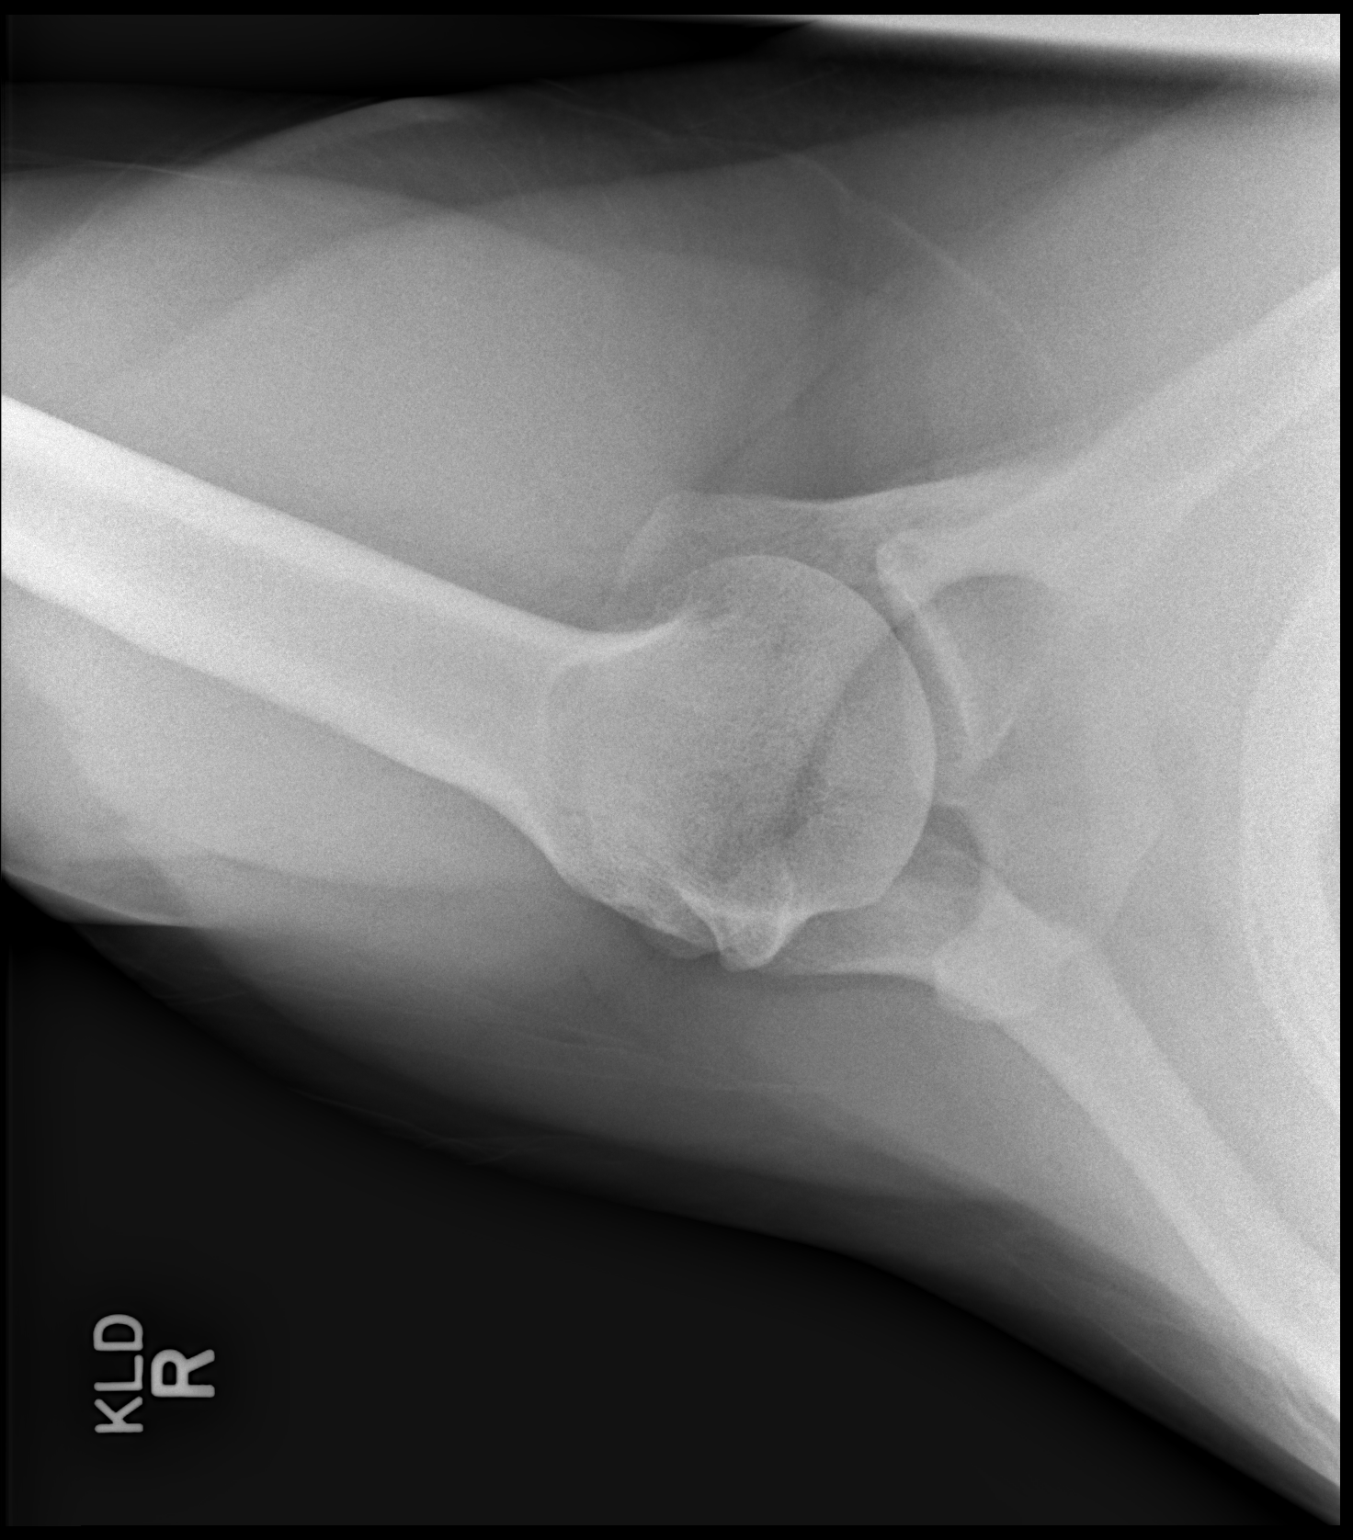

[3 of 3 positions shown; findings below may reference images not displayed]

FINDINGS: There is no evidence of fracture or dislocation.  There
is no evidence of arthropathy or other focal bone abnormality.
Soft tissues are unremarkable.
IMPRESSION: Negative.

## 2014-01-20 ENCOUNTER — Encounter (HOSPITAL_COMMUNITY): Payer: Self-pay | Admitting: Emergency Medicine

## 2014-01-20 ENCOUNTER — Emergency Department (HOSPITAL_COMMUNITY)
Admission: EM | Admit: 2014-01-20 | Discharge: 2014-01-20 | Disposition: A | Discharge status: Home / Self Care | Payer: Self-pay | Attending: Emergency Medicine | Admitting: Emergency Medicine

## 2014-01-20 ENCOUNTER — Emergency Department (HOSPITAL_COMMUNITY): Payer: Self-pay

## 2014-01-20 DIAGNOSIS — Z880 Allergy status to penicillin: Secondary | ICD-10-CM | POA: Insufficient documentation

## 2014-01-20 DIAGNOSIS — Z791 Long term (current) use of non-steroidal anti-inflammatories (NSAID): Secondary | ICD-10-CM | POA: Insufficient documentation

## 2014-01-20 DIAGNOSIS — Z79899 Other long term (current) drug therapy: Secondary | ICD-10-CM | POA: Insufficient documentation

## 2014-01-20 DIAGNOSIS — Z792 Long term (current) use of antibiotics: Secondary | ICD-10-CM | POA: Insufficient documentation

## 2014-01-20 DIAGNOSIS — Z720 Tobacco use: Secondary | ICD-10-CM | POA: Insufficient documentation

## 2014-01-20 DIAGNOSIS — R0789 Other chest pain: Secondary | ICD-10-CM | POA: Insufficient documentation

## 2014-01-20 DIAGNOSIS — I10 Essential (primary) hypertension: Secondary | ICD-10-CM | POA: Insufficient documentation

## 2014-01-20 LAB — PRO B NATRIURETIC PEPTIDE: PRO B NATRI PEPTIDE: 287.5 pg/mL — AB (ref 0–125)

## 2014-01-20 LAB — CBC WITH DIFFERENTIAL/PLATELET
BASOS ABS: 0 10*3/uL (ref 0.0–0.1)
BASOS PCT: 0 % (ref 0–1)
EOS ABS: 0.2 10*3/uL (ref 0.0–0.7)
Eosinophils Relative: 3 % (ref 0–5)
HCT: 44.7 % (ref 39.0–52.0)
HEMOGLOBIN: 16 g/dL (ref 13.0–17.0)
Lymphocytes Relative: 50 % — ABNORMAL HIGH (ref 12–46)
Lymphs Abs: 3.6 10*3/uL (ref 0.7–4.0)
MCH: 30.7 pg (ref 26.0–34.0)
MCHC: 35.8 g/dL (ref 30.0–36.0)
MCV: 85.8 fL (ref 78.0–100.0)
Monocytes Absolute: 0.5 10*3/uL (ref 0.1–1.0)
Monocytes Relative: 7 % (ref 3–12)
NEUTROS ABS: 2.8 10*3/uL (ref 1.7–7.7)
NEUTROS PCT: 40 % — AB (ref 43–77)
PLATELETS: 234 10*3/uL (ref 150–400)
RBC: 5.21 MIL/uL (ref 4.22–5.81)
RDW: 13.6 % (ref 11.5–15.5)
WBC: 7.1 10*3/uL (ref 4.0–10.5)

## 2014-01-20 LAB — TROPONIN I

## 2014-01-20 LAB — BASIC METABOLIC PANEL
ANION GAP: 14 (ref 5–15)
BUN: 9 mg/dL (ref 6–23)
CALCIUM: 9.1 mg/dL (ref 8.4–10.5)
CO2: 24 meq/L (ref 19–32)
CREATININE: 0.96 mg/dL (ref 0.50–1.35)
Chloride: 102 mEq/L (ref 96–112)
GFR calc non Af Amer: >90 mL/min (ref >90)
Glucose, Bld: 156 mg/dL — ABNORMAL HIGH (ref 70–99)
Potassium: 3.3 mEq/L — ABNORMAL LOW (ref 3.7–5.3)
SODIUM: 140 meq/L (ref 137–147)

## 2014-01-20 MED ORDER — HYDROCHLOROTHIAZIDE 12.5 MG PO CAPS
25.0000 mg | ORAL_CAPSULE | Freq: Once | ORAL | Status: AC
  Administered 2014-01-20: 25 mg via ORAL
  Filled 2014-01-20: qty 2

## 2014-01-20 MED ORDER — LISINOPRIL 20 MG PO TABS: 20.0000 mg | ORAL_TABLET | Freq: Every day | ORAL | Status: DC

## 2014-01-20 MED ORDER — NAPROXEN 500 MG PO TABS
500.0000 mg | ORAL_TABLET | Freq: Two times a day (BID) | ORAL | Status: DC
Start: 2014-01-20 — End: 2014-07-19

## 2014-01-20 MED ORDER — HYDROCHLOROTHIAZIDE 25 MG PO TABS
25.0000 mg | ORAL_TABLET | Freq: Every morning | ORAL | Status: DC
Start: 2014-01-20 — End: 2014-03-22

## 2014-01-20 MED ORDER — LISINOPRIL 20 MG PO TABS
20.0000 mg | ORAL_TABLET | Freq: Once | ORAL | Status: AC
  Administered 2014-01-20: 20 mg via ORAL
  Filled 2014-01-20: qty 1

## 2014-01-20 MED ORDER — NAPROXEN 500 MG PO TABS
500.0000 mg | ORAL_TABLET | Freq: Once | ORAL | Status: AC
  Administered 2014-01-20: 500 mg via ORAL
  Filled 2014-01-20: qty 1

## 2014-01-20 NOTE — Discharge Instructions (Signed)
Chest Wall Pain Chest wall pain is pain in or around the bones and muscles of your chest. It may take up to 6 weeks to get better. It may take longer if you must stay physically active in your work and activities.  CAUSES  Chest wall pain may happen on its own. However, it may be caused by:  A viral illness like the flu.  Injury.  Coughing.  Exercise.  Arthritis.  Fibromyalgia.  Shingles. HOME CARE INSTRUCTIONS   Avoid overtiring physical activity. Try not to strain or perform activities that cause pain. This includes any activities using your chest or your abdominal and side muscles, especially if heavy weights are used.  Put ice on the sore area.  Put ice in a plastic bag.  Place a towel between your skin and the bag.  Leave the ice on for 15-20 minutes per hour while awake for the first 2 days.  Only take over-the-counter or prescription medicines for pain, discomfort, or fever as directed by your caregiver. SEEK IMMEDIATE MEDICAL CARE IF:   Your pain increases, or you are very uncomfortable.  You have a fever.  Your chest pain becomes worse.  You have new, unexplained symptoms.  You have nausea or vomiting.  You feel sweaty or lightheaded.  You have a cough with phlegm (sputum), or you cough up blood. MAKE SURE YOU:   Understand these instructions.  Will watch your condition.  Will get help right away if you are not doing well or get worse. Document Released: 02/26/2005 Document Revised: 05/21/2011 Document Reviewed: 10/23/2010 [facility] Patient Information 2015 [place], [place]. This information is not intended to replace advice given to you by your health care provider. Make sure you discuss any questions you have with your health care provider.  DASH Eating Plan DASH stands for "Dietary Approaches to Stop Hypertension." The DASH eating plan is a healthy eating plan that has been shown to reduce high blood pressure (hypertension). Additional health  benefits may include reducing the risk of type 2 diabetes mellitus, heart disease, and stroke. The DASH eating plan may also help with weight loss. WHAT DO I NEED TO KNOW ABOUT THE DASH EATING PLAN? For the DASH eating plan, you will follow these general guidelines:  Choose foods with a percent daily value for sodium of less than 5% (as listed on the food label).  Use salt-free seasonings or herbs instead of table salt or sea salt.  Check with your health care provider or pharmacist before using salt substitutes.  Eat lower-sodium products, often labeled as "lower sodium" or "no salt added."  Eat fresh foods.  Eat more vegetables, fruits, and low-fat dairy products.  Choose whole grains. Look for the word "whole" as the first word in the ingredient list.  Choose fish and skinless chicken or [place] more often than red meat. Limit fish, poultry, and meat to 6 oz (170 g) each day.  Limit sweets, desserts, sugars, and sugary drinks.  Choose heart-healthy fats.  Limit cheese to 1 oz (28 g) per day.  Eat more home-cooked food and less restaurant, buffet, and fast food.  Limit fried foods.  Cook foods using methods other than frying.  Limit canned vegetables. If you do use them, rinse them well to decrease the sodium.  When eating at a restaurant, ask that your food be prepared with less salt, or no salt if possible. WHAT FOODS CAN I EAT? Seek help from a dietitian for individual calorie needs. Grains Whole grain or whole  wheat bread. Brown rice. Whole grain or whole wheat pasta. Quinoa, bulgur, and whole grain cereals. Low-sodium cereals. Corn or whole wheat flour tortillas. Whole grain cornbread. Whole grain crackers. Low-sodium crackers. Vegetables Fresh or frozen vegetables (raw, steamed, roasted, or grilled). Low-sodium or reduced-sodium tomato and vegetable juices. Low-sodium or reduced-sodium tomato sauce and paste. Low-sodium or reduced-sodium canned vegetables.  Fruits All  fresh, canned (in natural juice), or frozen fruits. Meat and Other Protein Products Ground beef (85% or leaner), grass-fed beef, or beef trimmed of fat. Skinless chicken or [place]. Ground chicken or [place]. Pork trimmed of fat. All fish and seafood. Eggs. Dried beans, peas, or lentils. Unsalted nuts and seeds. Unsalted canned beans. Dairy Low-fat dairy products, such as skim or 1% milk, 2% or reduced-fat cheeses, low-fat ricotta or cottage cheese, or plain low-fat yogurt. Low-sodium or reduced-sodium cheeses. Fats and Oils Tub margarines without trans fats. Light or reduced-fat mayonnaise and salad dressings (reduced sodium). Avocado. Safflower, olive, or canola oils. Natural peanut or almond butter. Other Unsalted popcorn and pretzels. The items listed above may not be a complete list of recommended foods or beverages. Contact your dietitian for more options. WHAT FOODS ARE NOT RECOMMENDED? Grains White bread. White pasta. White rice. Refined cornbread. Bagels and croissants. Crackers that contain trans fat. Vegetables Creamed or fried vegetables. Vegetables in a cheese sauce. Regular canned vegetables. Regular canned tomato sauce and paste. Regular tomato and vegetable juices. Fruits Dried fruits. Canned fruit in light or heavy syrup. Fruit juice. Meat and Other Protein Products Fatty cuts of meat. Ribs, chicken wings, bacon, sausage, bologna, salami, chitterlings, fatback, hot dogs, bratwurst, and packaged luncheon meats. Salted nuts and seeds. Canned beans with salt. Dairy Whole or 2% milk, cream, half-and-half, and cream cheese. Whole-fat or sweetened yogurt. Full-fat cheeses or blue cheese. Nondairy creamers and whipped toppings. Processed cheese, cheese spreads, or cheese curds. Condiments Onion and garlic salt, seasoned salt, table salt, and sea salt. Canned and packaged gravies. Worcestershire sauce. Tartar sauce. Barbecue sauce. Teriyaki sauce. Soy sauce, including reduced sodium.  Steak sauce. Fish sauce. Oyster sauce. Cocktail sauce. Horseradish. Ketchup and mustard. Meat flavorings and tenderizers. Bouillon cubes. Hot sauce. Tabasco sauce. Marinades. Taco seasonings. Relishes. Fats and Oils Butter, stick margarine, lard, shortening, ghee, and bacon fat. Coconut, palm kernel, or palm oils. Regular salad dressings. Other Pickles and olives. Salted popcorn and pretzels. The items listed above may not be a complete list of foods and beverages to avoid. Contact your dietitian for more information. WHERE CAN I FIND MORE INFORMATION? National Heart, Lung, and Blood Institute: travelstabloid.com Document Released: 02/15/2011 Document Revised: 07/13/2013 Document Reviewed: 12/31/2012 [facility] Patient Information 2015 [place], [place]. This information is not intended to replace advice given to you by your health care provider. Make sure you discuss any questions you have with your health care provider.  Managing Your High Blood Pressure Blood pressure is a measurement of how forceful your blood is pressing against the walls of the arteries. Arteries are muscular tubes within the circulatory system. Blood pressure does not stay the same. Blood pressure rises when you are active, excited, or nervous; and it lowers during sleep and relaxation. If the numbers measuring your blood pressure stay above normal most of the time, you are at risk for health problems. High blood pressure (hypertension) is a long-term (chronic) condition in which blood pressure is elevated. A blood pressure reading is recorded as two numbers, such as 120 over 80 (or 120/80). The first, higher number is called  the systolic pressure. It is a measure of the pressure in your arteries as the heart beats. The second, lower number is called the diastolic pressure. It is a measure of the pressure in your arteries as the heart relaxes between beats.  Keeping your blood pressure in a normal  range is important to your overall health and prevention of health problems, such as heart disease and stroke. When your blood pressure is uncontrolled, your heart has to work harder than normal. High blood pressure is a very common condition in adults because blood pressure tends to rise with age. Men and women are equally likely to have hypertension but at different times in life. Before age 62, men are more likely to have hypertension. After 39 years of age, women are more likely to have it. Hypertension is especially common in African Americans. This condition often has no signs or symptoms. The cause of the condition is usually not known. Your caregiver can help you come up with a plan to keep your blood pressure in a normal, healthy range. BLOOD PRESSURE STAGES Blood pressure is classified into four stages: normal, prehypertension, stage 1, and stage 2. Your blood pressure reading will be used to determine what type of treatment, if any, is necessary. Appropriate treatment options are tied to these four stages:  Normal  Systolic pressure (mm Hg): below 120.  Diastolic pressure (mm Hg): below 80. Prehypertension  Systolic pressure (mm Hg): 120 to 139.  Diastolic pressure (mm Hg): 80 to 89. Stage1  Systolic pressure (mm Hg): 140 to 159.  Diastolic pressure (mm Hg): 90 to 99. Stage2  Systolic pressure (mm Hg): 160 or above.  Diastolic pressure (mm Hg): 100 or above. RISKS RELATED TO HIGH BLOOD PRESSURE Managing your blood pressure is an important responsibility. Uncontrolled high blood pressure can lead to:  A heart attack.  A stroke.  A weakened blood vessel (aneurysm).  Heart failure.  Kidney damage.  Eye damage.  Metabolic syndrome.  Memory and concentration problems. HOW TO MANAGE YOUR BLOOD PRESSURE Blood pressure can be managed effectively with lifestyle changes and medicines (if needed). Your caregiver will help you come up with a plan to bring your blood  pressure within a normal range. Your plan should include the following: Education  Read all information provided by your caregivers about how to control blood pressure.  Educate yourself on the latest guidelines and treatment recommendations. New research is always being done to further define the risks and treatments for high blood pressure. Lifestylechanges  Control your weight.  Avoid smoking.  Stay physically active.  Reduce the amount of salt in your diet.  Reduce stress.  Control any chronic conditions, such as high cholesterol or diabetes.  Reduce your alcohol intake. Medicines  Several medicines (antihypertensive medicines) are available, if needed, to bring blood pressure within a normal range. Communication  Review all the medicines you take with your caregiver because there may be side effects or interactions.  Talk with your caregiver about your diet, exercise habits, and other lifestyle factors that may be contributing to high blood pressure.  See your caregiver regularly. Your caregiver can help you create and adjust your plan for managing high blood pressure. RECOMMENDATIONS FOR TREATMENT AND FOLLOW-UP  The following recommendations are based on current guidelines for managing high blood pressure in nonpregnant adults. Use these recommendations to identify the proper follow-up period or treatment option based on your blood pressure reading. You can discuss these options with your caregiver.  Systolic pressure  of 120 to 295 or diastolic pressure of 80 to 89: Follow up with your caregiver as directed.  Systolic pressure of 284 to 132 or diastolic pressure of 90 to 100: Follow up with your caregiver within 2 months.  Systolic pressure above 440 or diastolic pressure above 102: Follow up with your caregiver within 1 month.  Systolic pressure above 725 or diastolic pressure above 366: Consider antihypertensive therapy; follow up with your caregiver within 1  week.  Systolic pressure above 440 or diastolic pressure above 347: Begin antihypertensive therapy; follow up with your caregiver within 1 week. Document Released: 11/21/2011 Document Reviewed: 11/21/2011 [facility] Patient Information 2015 [place]. This information is not intended to replace advice given to you by your health care provider. Make sure you discuss any questions you have with your health care provider.  Emergency Department Resource Guide 1) Find a Doctor and Pay Out of Pocket Although you won't have to find out who is covered by your insurance plan, it is a good idea to ask around and get recommendations. You will then need to call the office and see if the doctor you have chosen will accept you as a new patient and what types of options they offer for patients who are self-pay. Some doctors offer discounts or will set up payment plans for their patients who do not have insurance, but you will need to ask so you aren't surprised when you get to your appointment.  2) Contact Your Local Health Department Not all health departments have doctors that can see patients for sick visits, but many do, so it is worth a call to see if yours does. If you don't know where your local health department is, you can check in your phone book. The CDC also has a tool to help you locate your state's health department, and many state websites also have listings of all of their local health departments.  3) Find a [facility] If your illness is not likely to be very severe or complicated, you may want to try a walk in clinic. These are popping up all over the country in pharmacies, drugstores, and shopping centers. They're usually staffed by nurse practitioners or physician assistants that have been trained to treat common illnesses and complaints. They're usually fairly quick and inexpensive. However, if you have serious medical issues or chronic medical problems, these are probably not your best  option.  No Primary Care Doctor: - Call Health Connect at  [phone] - they can help you locate a primary care doctor that  accepts your insurance, provides certain services, etc. - Physician Referral Service- [phone]  Chronic Pain Problems: Organization         Address  Phone   Notes  [facility]  [phone] Patients need to be referred by their primary care doctor.   Medication Assistance: Organization         Address  Phone   Notes  [facility] Medication [facility] [place] [place],  [address] [phone] --Must be a resident of [facility] -- Must have NO insurance coverage whatsoever (no Medicaid/ Medicare, etc.) -- The pt. MUST have a primary care doctor that directs their care regularly and follows them in the community   MedAssist  [phone]   [employer]  [phone]    Agencies that provide inexpensive medical care: Organization         Address  Phone   Notes  [facility]  [phone]   [facility]    [phone]   [facility] [place], [place] [address] [phone]   [place] [address] [place] [phone]   Planned Parenthood    [phone]   [facility]    [phone]   [place] and [address], [place] Phone:  [phone], Fax:  [phone] Hours of Operation:  9 am - 6 pm, M-F.  Also accepts Medicaid/Medicare and self-pay.  [facility] for [place], [address], [place] Phone: [phone], Fax: [phone]. Hours of Operation:  8:30 am - 5:30 pm, M-F.  Also accepts Medicaid and self-pay.  [facility] [place] [address], [place] Phone: [phone]   [place], [place], [place] [phone], Ext. [phone] Mondays & Thursdays: 7-9 AM.  First 15  patients are seen on a first come, first serve basis.    [place] Providers:  Organization         Address  Phone   Notes  [facility] [address], [place] [phone] Also accepts self-pay patients.  [facility] [address], [place]  [phone]   [place], [address], [place] [phone]   [facility] [address], [place] [phone]   Lucianne Lei [address], [place]   [phone] Only accepts [place] Access [place] patients after they have their name applied to their card.   Self-Pay (no insurance) in [facility]:  Organization         Address  Phone   Notes  Sickle Cell Patients, [facility] [place] [phone]   [facility] [place] [phone]   [facility] [place]  [place], [place], [place] [phone]   [facility]/Dr. Osei-Bonsu  [address] [place] or [address], [place] [phone] Phone number for both [place] and [place] locations is the same.  Urgent Medical and [facility] [address] [place] [phone]   [facility] [address] [place] or [address] [phone]   [facility] [address] [place] [phone], phone; [phone], fax Sees patients 1st and 3rd Saturday of every month.  Must not qualify for public or private insurance (i.e. Medicaid, Medicare, [place] Health Choice, Veterans' Benefits)  Household income should be no more than 200% of the poverty level The clinic cannot treat you if you are pregnant or think you are pregnant  Sexually transmitted diseases are not treated at the clinic.    Dental  Care: Organization         Address  Phone  Notes  [facility] Department of [facility] [place] [phone] Accepts children up to age 19 who are enrolled in [place] or [place]; pregnant women with a Medicaid card; and children who have applied for Medicaid or  Health Choice, but were declined, whose parents can pay a reduced fee at time of service.  [facility] Department of  [facility]  [address], [place] [phone] Accepts children up to age 3 who are enrolled in Medicaid or [place]; pregnant women with a Medicaid card; and children who have applied for Medicaid or [place] Health Choice, but were declined, whose parents can pay a reduced fee at time of service.  [place] Adult [facility] Access PRO[facility]  [place] [phone] Patients are seen by appointment only. Walk-ins are not accepted. [place] will see patients 80 years of age and older. Monday - Tuesday (8am-5pm) Most Wednesdays (8:30-5pm) $30 per visit, cash only  [facility] Adult Dental Access PROGRAM  [address], [facility] [phone] Patients are seen by appointment only. Walk-ins are not accepted. [place] will see patients 28 years of age and older. One Wednesday Evening (Monthly: Volunteer Based).  $30 per visit, cash only  [place]  [phone] for adults; Children under age 55, call Graduate Pediatric Dentistry at [phone]. Children aged 58-14, please call [phone] to request a pediatric application.  Dental services are provided in all areas of dental care including fillings, crowns and bridges, complete and partial dentures, implants, gum treatment, root canals, and extractions. Preventive care is also provided. Treatment is provided to both adults and children. Patients are selected via a lottery and there is often a waiting list.   [facility] [address] [place]  [phone] [email or web address]   [facility] [address] [place], [place] [phone], Ext. [phone] Second and Fourth Thursday of each month, opens at 6:30 AM; Clinic ends at 9 AM.  Patients are seen on a first-come first-served basis, and a limited number are seen during each clinic.   [facility]  [address] [place], [place] [phone]   Eligibility Requirements You must have lived in [place], [place], or [place] counties for at least the last three months.   You cannot be eligible for state or federal sponsored [employer], including [employer], [place], or [employer].   You generally cannot be eligible for healthcare insurance through your employer.    How to apply: Eligibility screenings are held every Tuesday and Wednesday afternoon from 1:00 pm until 4:00 pm. You do not need an appointment for the interview!  [facility] [address], [place], [place]   Between  [place] Department  [place]  [phone]    Behavioral Health Resources in the Community: Intensive Outpatient Programs Organization         Address  Phone  Notes  [place]. [address] [place], [place] [phone]   [facility] [address] [place], [place]   ADS: Alcohol & Drug [facility] [address] [place], [place]   [place] [address],  [place], [place] or [phone]   Substance Abuse Resources Organization         Address  Phone  Notes  Alcohol and Drug Services  [phone]   [place]  [phone]   [facility] [place]   Chinita Pester  [phone]   Residential & Outpatient Substance Abuse Program  [phone]    Psychological Services Organization         Address  Phone  Notes  [place]  [place]   [place] [address], [place] or [phone]    Mobile Crisis Teams Organization         Address  Phone  Notes  [facility], Mobile Crisis Care Unit  [phone]   [facility]  [address] [place], [place]   [place] [address], [place] [phone]    Self-Help/Support Groups Organization         Address  Phone             Notes  [place]. of [place] - variety of support groups  [place] Call for more information  Narcotics Anonymous (NA), [facility] [address], [employer] [place]  2 meetings at this location   Special educational needs teacher         Address  Phone  Notes  [facility] [place],    [place]  [phone]   [facility]  [address] [place] 932671, [place], [place]   [place], [place] [phone] Admissions: 8am-3pm M-F  Incentives Substance [place] [address],    [place], [place] [phone]   [facility] [address] [place], [employer], [place]   [facility] [address]  [place], [place]   [facility] Programs - Intensive Outpatient [address], [place], [place]   [facility] [address]  [place], [place] [phone] or [phone]   [facility] Services (RTS) [address] [place], [place] Accepts Medicaid  [facility] [place] [address],  [place] [phone] Substance Abuse/Addiction Treatment   [facility] Organization         Address  Phone  Notes  [facility]  [phone]   Domenic Schwab, PhD [address] [place], [place]   [phone] or [phone]   [place], [place] [phone]   [facility] [address] [place], [place] [phone] Insurance/Medicaid/sponsorship through [facility] and Families [address], [address] [place]                                    [place], [place] [phone] [place] [address], [place] [phone]    Dr. Adele Schilder  [phone]   [facility] [place] Dept. 1) [address] [place] 2) [place] 3)  [place] [address], [place] [phone]  [phone]   [place] [phone] or [phone] (After Hours)

## 2014-01-20 NOTE — ED Provider Notes (Signed)
CSN: [id number]     Arrival date & time 01/20/14  0057 History   First MD Initiated Contact with Patient 01/20/14 0235     Chief Complaint  Patient presents with  . Chest Pain     (Consider location/radiation/quality/duration/timing/severity/associated sxs/prior Treatment) HPI 38 year old male presents to emergency room with complaint of right-sided chest pain and elevated blood pressure.  Patient has history of hypertension, has been off his medications for the last year due to finances.  He has noticed over the last 3 days he has had a sharp pain in his right chest that is worse with moving his arm and with twisting.  He denies any nausea, shortness of breath, diaphoresis.  Patient has cut back on his smoking to less than a pack a day.  He denies any family history of coronary disease.  No leg swelling, prolonged immobilization or other triggers for PE. Past Medical History  Diagnosis Date  . Hypertension   . Wears glasses    Past Surgical History  Procedure Laterality Date  . Cholecystectomy     Family History  Problem Relation Age of Onset  . Cancer Mother    History  Substance Use Topics  . Smoking status: Current Every Day Smoker -- 1.00 packs/day    Types: Cigars  . Smokeless tobacco: Never Used  . Alcohol Use: No     Comment: occ    Review of Systems   See History of Present Illness; otherwise all other systems are reviewed and negative  Allergies  Penicillins  Home Medications   Prior to Admission medications   Medication Sig Start Date End Date Taking? Authorizing Provider  clindamycin (CLEOCIN) 150 MG capsule Take 1 capsule (150 mg total) by mouth every 6 (six) hours. 04/13/13   Garald Balding, NP  hydrochlorothiazide (HYDRODIURIL) 25 MG tablet Take 1 tablet (25 mg total) by mouth every morning. 01/20/14   Kalman Drape, MD  ibuprofen (ADVIL,MOTRIN) 200 MG tablet Take 800 mg by mouth every 6 (six) hours as needed (pain).    Historical Provider, MD  lisinopril  (PRINIVIL,ZESTRIL) 20 MG tablet Take 20 mg by mouth every morning.    Historical Provider, MD  lisinopril (PRINIVIL,ZESTRIL) 20 MG tablet Take 1 tablet (20 mg total) by mouth daily. 01/20/14   Kalman Drape, MD  naproxen (NAPROSYN) 500 MG tablet Take 1 tablet (500 mg total) by mouth 2 (two) times daily with a meal. 01/20/14   Kalman Drape, MD  traMADol (ULTRAM) 50 MG tablet Take 1 tablet (50 mg total) by mouth every 6 (six) hours as needed. 04/13/13   Garald Balding, NP   BP 200/125 mmHg  Pulse 77  Temp(Src) 97.9 F (36.6 C) (Oral)  Resp 22  Ht 6\' 2"  (1.88 m)  Wt 220 lb (99.791 kg)  BMI 28.23 kg/m2  SpO2 98% Physical Exam  Constitutional: He is oriented to person, place, and time. He appears well-developed and well-nourished.  HENT:  Head: Normocephalic and atraumatic.  Nose: Nose normal.  Mouth/Throat: Oropharynx is clear and moist.  Eyes: Conjunctivae and EOM are normal. Pupils are equal, round, and reactive to light.  Neck: Normal range of motion. Neck supple. No JVD present. No tracheal deviation present. No thyromegaly present.  Cardiovascular: Normal rate, regular rhythm, normal heart sounds and intact distal pulses.  Exam reveals no gallop and no friction rub.   No murmur heard. Pulmonary/Chest: Effort normal and breath sounds normal. No stridor. No respiratory distress. He has no wheezes.  He has no rales. He exhibits tenderness (patient has tenderness to palpation over right chest and right sternal costal margin).  Abdominal: Soft. Bowel sounds are normal. He exhibits no distension and no mass. There is no tenderness. There is no rebound and no guarding.  Musculoskeletal: Normal range of motion. He exhibits no edema or tenderness.  Lymphadenopathy:    He has no cervical adenopathy.  Neurological: He is alert and oriented to person, place, and time. He displays normal reflexes. He exhibits normal muscle tone. Coordination normal.  Skin: Skin is warm and dry. No rash noted. No  erythema. No pallor.  Psychiatric: He has a normal mood and affect. His behavior is normal. Judgment and thought content normal.  Nursing note and vitals reviewed.   ED Course  Procedures (including critical care time) Labs Review Labs Reviewed  BASIC METABOLIC PANEL - Abnormal; Notable for the following:    Potassium 3.3 (*)    Glucose, Bld 156 (*)    All other components within normal limits  CBC WITH DIFFERENTIAL - Abnormal; Notable for the following:    Neutrophils Relative % 40 (*)    Lymphocytes Relative 50 (*)    All other components within normal limits  PRO B NATRIURETIC PEPTIDE - Abnormal; Notable for the following:    Pro B Natriuretic peptide (BNP) 287.5 (*)    All other components within normal limits  TROPONIN I    Imaging Review Dg Chest 2 View  01/20/2014   CLINICAL DATA:  Midsternal chest pain beginning 01/18/2014. Pain radiates into the right arm.  EXAM: CHEST  2 VIEW  COMPARISON:  PA and lateral chest 09/20/2012.  FINDINGS: The lungs are clear. Heart size is normal. No pneumothorax or pleural effusion. Convex right scoliosis is noted.  IMPRESSION: No acute disease.   Electronically Signed   By: Inge Rise M.D.   On: 01/20/2014 02:11     EKG Interpretation   Date/Time:  Wednesday January 20 2014 01:42:49 EST Ventricular Rate:  76 PR Interval:  211 QRS Duration: 88 QT Interval:  404 QTC Calculation: 454 R Axis:   56 Text Interpretation:  Sinus rhythm Prolonged PR interval Probable left  atrial enlargement Probable anterior infarct, age indeterminate Lateral  leads are also involved Confirmed by Laresha Bacorn  MD, Breona Cherubin ([id number]) on 01/20/2014  3:13:45 AM      MDM   Final diagnoses:  Chest wall pain  Essential hypertension    38 year old male with significant hypertension noncompliant with his medications.  Patient has negative troponin and BNP.  Chest x-ray is also normal.  Suspect his right-sided chest pain is musculoskeletal as it can be reproduced  easily.  EKG has deep T-wave inversions inferior laterally, but no change from prior.  Patient counseled on need for being on his blood pressure medication.  There is no signs of end organ damage.  First dose of lisinopril and hydrochlorothiazide given here in the emergency department with prescriptions to go home.   Kalman Drape, MD 01/20/14 [phone]

## 2014-01-20 NOTE — ED Notes (Signed)
Pt presents with R sided CP onset Monday @ 10pm, pt states pain began after making movement with R arm. Pt is reproducable with movement, palpation, breathing and cough. Pt denies n/v/d, denies lightheadedness. Pt has been out of his HTN medication d/t no insurance x 1 year

## 2014-01-20 NOTE — ED Notes (Signed)
Pt states that he began to have mid-sternal chest pain Monday night; pt states that the pain is sharp and is reproducible with movement; pt states that the pain is worse with movement of his rt arm to move his chest; pt states that the pain gets worse at work; pt states that he has a hx of HTN but due to no insurance pt has not taken medications in over a year; pt states that he is currently working and should have insurance soon; pt is concerned over having the chest pain and untreated HTN

## 2014-02-14 ENCOUNTER — Emergency Department (HOSPITAL_COMMUNITY)
Admission: EM | Admit: 2014-02-14 | Discharge: 2014-02-14 | Disposition: A | Discharge status: Home / Self Care | Payer: Self-pay | Attending: Emergency Medicine | Admitting: Emergency Medicine

## 2014-02-14 ENCOUNTER — Encounter (HOSPITAL_COMMUNITY): Payer: Self-pay | Admitting: Emergency Medicine

## 2014-02-14 DIAGNOSIS — Z880 Allergy status to penicillin: Secondary | ICD-10-CM | POA: Insufficient documentation

## 2014-02-14 DIAGNOSIS — Z720 Tobacco use: Secondary | ICD-10-CM | POA: Insufficient documentation

## 2014-02-14 DIAGNOSIS — Z79899 Other long term (current) drug therapy: Secondary | ICD-10-CM | POA: Insufficient documentation

## 2014-02-14 DIAGNOSIS — K029 Dental caries, unspecified: Secondary | ICD-10-CM | POA: Insufficient documentation

## 2014-02-14 DIAGNOSIS — I10 Essential (primary) hypertension: Secondary | ICD-10-CM | POA: Insufficient documentation

## 2014-02-14 MED ORDER — HYDROCODONE-ACETAMINOPHEN 5-325 MG PO TABS: 1.0000 | ORAL_TABLET | ORAL | Status: DC | PRN

## 2014-02-14 MED ORDER — CLINDAMYCIN HCL 150 MG PO CAPS: 300.0000 mg | ORAL_CAPSULE | Freq: Three times a day (TID) | ORAL | Status: DC

## 2014-02-14 NOTE — ED Provider Notes (Signed)
CSN: [id number]     Arrival date & time 02/14/14  2306 History  This chart was scribed for non-physician practitioner, Charlann Lange, PA-C working with Wandra Arthurs, MD by Frederich Balding, ED scribe. This patient was seen in room WTR8/WTR8 and the patient's care was started at 11:21 PM.   Chief Complaint  Patient presents with  . Dental Pain   The history is provided by the patient. No language interpreter was used.    HPI Comments: [name] is a 38 y.o. male who presents to the Emergency Department complaining of right lower dental pain that started 2 days ago. Denies drainage from the area. Pt states he has not been seen for this dental pain yet but has not followed up with the dentists he has been referred to in the past. He has taken ibuprofen with no relief. Denies fever, facial swelling, emesis.   Past Medical History  Diagnosis Date  . Hypertension   . Wears glasses    Past Surgical History  Procedure Laterality Date  . Cholecystectomy     Family History  Problem Relation Age of Onset  . Cancer Mother    History  Substance Use Topics  . Smoking status: Current Every Day Smoker -- 1.00 packs/day    Types: Cigars  . Smokeless tobacco: Never Used  . Alcohol Use: No     Comment: occ    Review of Systems  Constitutional: Negative for fever.  HENT: Positive for dental problem. Negative for facial swelling.   Gastrointestinal: Negative for vomiting.  All other systems reviewed and are negative.  Allergies  Penicillins  Home Medications   Prior to Admission medications   Medication Sig Start Date End Date Taking? Authorizing Provider  clindamycin (CLEOCIN) 150 MG capsule Take 1 capsule (150 mg total) by mouth every 6 (six) hours. 04/13/13   Garald Balding, NP  hydrochlorothiazide (HYDRODIURIL) 25 MG tablet Take 1 tablet (25 mg total) by mouth every morning. 01/20/14   Kalman Drape, MD  ibuprofen (ADVIL,MOTRIN) 200 MG tablet Take 800 mg by mouth every 6 (six) hours as  needed (pain).    Historical Provider, MD  lisinopril (PRINIVIL,ZESTRIL) 20 MG tablet Take 20 mg by mouth every morning.    Historical Provider, MD  lisinopril (PRINIVIL,ZESTRIL) 20 MG tablet Take 1 tablet (20 mg total) by mouth daily. 01/20/14   Kalman Drape, MD  naproxen (NAPROSYN) 500 MG tablet Take 1 tablet (500 mg total) by mouth 2 (two) times daily with a meal. 01/20/14   Kalman Drape, MD  traMADol (ULTRAM) 50 MG tablet Take 1 tablet (50 mg total) by mouth every 6 (six) hours as needed. 04/13/13   Garald Balding, NP   BP 196/105 mmHg  Pulse 73  Temp(Src) 98.1 F (36.7 C) (Oral)  Resp 18  Ht 6' 2.5" (1.892 m)  Wt 220 lb (99.791 kg)  BMI 27.88 kg/m2  SpO2 98%   Physical Exam  Constitutional: He is oriented to person, place, and time. He appears well-developed and well-nourished. No distress.  Pt uncomfortable appearing.  HENT:  Head: Normocephalic and atraumatic.  Widespread dental decay. No focal abscess. No facial swelling. Oropharynx benign. Right lower molars tender to touch but stable.   Eyes: Conjunctivae and EOM are normal.  Neck: Neck supple. No tracheal deviation present.  Cardiovascular: Normal rate.   Pulmonary/Chest: Effort normal. No respiratory distress.  Musculoskeletal: Normal range of motion.  Lymphadenopathy:    He has no cervical adenopathy.  Neurological: He is alert and oriented to person, place, and time.  Skin: Skin is warm and dry.  Psychiatric: He has a normal mood and affect. His behavior is normal.  Nursing note and vitals reviewed.   ED Course  Procedures (including critical care time)  DIAGNOSTIC STUDIES: Oxygen Saturation is 98% on RA, normal by my interpretation.    COORDINATION OF CARE: 11:22 PM-Discussed treatment plan which includes pain medication with pt at bedside and pt agreed to plan. Will give pt dental referrals and advised him to follow up.   Labs Review Labs Reviewed - No data to display  Imaging Review No results found.    EKG Interpretation None      MDM   Final diagnoses:  None    1. Dental caries  Strongly encouraged dental follow up. Will cover with abx, pain medication. Dental resources provided.  I personally performed the services described in this documentation, which was scribed in my presence. The recorded information has been reviewed and is accurate.  Dewaine Oats, PA-C 02/14/14 2352  Wandra Arthurs, MD 02/15/14 1155

## 2014-02-14 NOTE — Discharge Instructions (Signed)
Dental Caries Dental caries is tooth decay. This decay can cause a hole in teeth (cavity) that can get bigger and deeper over time. HOME CARE  Brush and floss your teeth. Do this at least two times a day.  Use a fluoride toothpaste.  Use a mouth rinse if told by your dentist or doctor.  Eat less sugary and starchy foods. Drink less sugary drinks.  Avoid snacking often on sugary and starchy foods. Avoid sipping often on sugary drinks.  Keep regular checkups and cleanings with your dentist.  Use fluoride supplements if told by your dentist or doctor.  Allow fluoride to be applied to teeth if told by your dentist or doctor. Document Released: 12/06/2007 Document Revised: 07/13/2013 Document Reviewed: 02/29/2012 [facility] Patient Information 2015 [place], [place]. This information is not intended to replace advice given to you by your health care provider. Make sure you discuss any questions you have with your health care provider.  Emergency Department Resource Guide 1) Find a Doctor and Pay Out of Pocket Although you won't have to find out who is covered by your insurance plan, it is a good idea to ask around and get recommendations. You will then need to call the office and see if the doctor you have chosen will accept you as a new patient and what types of options they offer for patients who are self-pay. Some doctors offer discounts or will set up payment plans for their patients who do not have insurance, but you will need to ask so you aren't surprised when you get to your appointment.  2) Contact Your Local Health Department Not all health departments have doctors that can see patients for sick visits, but many do, so it is worth a call to see if yours does. If you don't know where your local health department is, you can check in your phone book. The CDC also has a tool to help you locate your state's health department, and many state websites also have listings of all of their local  health departments.  3) Find a [facility] If your illness is not likely to be very severe or complicated, you may want to try a walk in clinic. These are popping up all over the country in pharmacies, drugstores, and shopping centers. They're usually staffed by nurse practitioners or physician assistants that have been trained to treat common illnesses and complaints. They're usually fairly quick and inexpensive. However, if you have serious medical issues or chronic medical problems, these are probably not your best option.  No Primary Care Doctor: - Call Health Connect at  [phone] - they can help you locate a primary care doctor that  accepts your insurance, provides certain services, etc. - Physician Referral Service- [phone]  Chronic Pain Problems: Organization         Address  Phone   Notes  [facility]  [phone] Patients need to be referred by their primary care doctor.   Medication Assistance: Organization         Address  Phone   Notes  [facility] Medication [facility] [address] [place], [place] [address] [phone] --Must be a resident of [facility] -- Must have NO insurance coverage whatsoever (no Medicaid/ Medicare, etc.) -- The pt. MUST have a primary care doctor that directs their care regularly and follows them in the community   [facility]  [phone]   [employer]  [phone]    Agencies that  provide inexpensive medical care: Organization         Address  Phone   Notes  [place]  [phone]   [facility]    [phone]   [facility] [place], [place] [address] [phone]   [place] [address], [place] [phone]   Planned Parenthood    [phone]   [facility]    [phone]   [place] [address], [place] Phone:   [phone], Fax:  [phone] Hours of Operation:  9 am - 6 pm, M-F.  Also accepts Medicaid/Medicare and self-pay.  [facility] for [place], [address], [place] Phone: [phone], Fax: [phone]. Hours of Operation:  8:30 am - 5:30 pm, M-F.  Also accepts Medicaid and self-pay.  [facility] [place] [address], [place] Phone: [phone]   [place], [place], [place] [phone], Ext. [phone] Mondays & Thursdays: 7-9 AM.  First 15 patients are seen on a first come, first serve basis.    [place] Providers:  Organization         Address  Phone   Notes  [facility] [address], [place] [phone] Also accepts self-pay patients.  [facility] [address], [place]  [phone]   [place], [address], [place] [phone]   [facility] [address], [place] [phone]   Lucianne Lei [address], [place]   [phone] Only accepts [place] Access [place] patients after they have their name applied to their card.   Self-Pay (no insurance) in [facility]:  Organization         Address  Phone   Notes  Sickle Cell Patients, [facility] [place] [phone]   [facility] [place] [phone]   [facility] [place]  [place], [place], [place] [phone]   [facility]/Dr. Osei-Bonsu  [address], [place] or [address], [place] [phone] Phone number for both [place] and [place] locations is the same.  Urgent Medical and [facility] [address], [place] [phone]   [facility] [address] [place] or [address] [phone]   [facility] [address], [place] [phone], phone; [phone], fax Sees patients 1st and 3rd Saturday of every month.  Must not qualify for public or private insurance (i.e. Medicaid, Medicare, [place] Health Choice, Veterans' Benefits)  Household income should be no more than 200% of the poverty level The clinic cannot treat you if you are pregnant or think you are pregnant  Sexually transmitted diseases are not treated at the clinic.    Dental Care: Organization         Address  Phone  Notes  [facility] Department of [facility] [place] [phone] Accepts children up to age 50 who are enrolled in [place] or [place]; pregnant women with a Medicaid card; and children who have applied for Medicaid or [place]  Choice, but were declined, whose parents can pay a reduced fee at time of service.  [facility] Department of [facility]  [address], [place] [phone] Accepts children up to age 67 who are enrolled in [place] or [place]; pregnant women with a Medicaid card; and children who have applied for Medicaid or [place] Health Choice, but were declined, whose parents can pay a reduced fee at time of service.  [place] Adult Dental Access PROGRAM  [place] [phone] Patients are seen by appointment only. Walk-ins are not accepted. [place] will see patients 5 years of age and older. Monday - Tuesday (8am-5pm) Most Wednesdays (8:30-5pm) $30 per visit, cash only  [facility] Adult Dental Access PROGRAM  [address], [facility] [phone] Patients are seen by appointment only. Walk-ins are not accepted. [place] will see patients 72 years of age and older. One Wednesday Evening (Monthly: Volunteer Based).  $30 per visit, cash only  [place]  [phone] for adults;  Children under age 78, call Graduate Pediatric Dentistry at [phone]. Children aged 32-14, please call [phone] to request a pediatric application.  Dental services are provided in all areas of dental care including fillings, crowns and bridges, complete and partial dentures, implants, gum treatment, root canals, and extractions. Preventive care is also provided. Treatment is provided to both adults and children. Patients are selected via a lottery and there is often a waiting list.   [facility] [address] [place]  [phone] [email or web address]   [facility] [address] [place], [place] [phone], Ext. [phone] Second and Fourth Thursday of each month, opens at 6:30 AM; Clinic ends at 9 AM.  Patients are seen on a first-come first-served basis, and a limited number are seen during each clinic.   [facility]  [address] [place], [place] [phone]   Eligibility Requirements You must have lived in [place], [place], or [place] counties for at least the last three months.   You cannot be eligible for state or federal sponsored [employer], including [employer], [place], or [employer].   You generally cannot be eligible for healthcare insurance through your employer.    How to apply: Eligibility screenings are held every Tuesday and Wednesday afternoon from 1:00 pm until 4:00 pm. You do not need an appointment for the interview!  [facility] [address], [place], [place]   [place]  [phone]

## 2014-02-14 NOTE — ED Notes (Signed)
Pt c/o dental pain to R lower mouth onset Friday. Denies any drainage from the area.

## 2014-03-22 ENCOUNTER — Emergency Department (HOSPITAL_COMMUNITY)
Admission: EM | Admit: 2014-03-22 | Discharge: 2014-03-22 | Disposition: A | Discharge status: Home / Self Care | Payer: Self-pay | Attending: Emergency Medicine | Admitting: Emergency Medicine

## 2014-03-22 ENCOUNTER — Encounter (HOSPITAL_COMMUNITY): Payer: Self-pay | Admitting: *Deleted

## 2014-03-22 ENCOUNTER — Encounter (HOSPITAL_COMMUNITY): Payer: Self-pay | Admitting: Emergency Medicine

## 2014-03-22 ENCOUNTER — Emergency Department (HOSPITAL_COMMUNITY)
Admission: EM | Admit: 2014-03-22 | Discharge: 2014-03-22 | Discharge status: Against Medical Advice | Payer: Self-pay | Attending: Emergency Medicine | Admitting: Emergency Medicine

## 2014-03-22 ENCOUNTER — Emergency Department (HOSPITAL_COMMUNITY): Payer: Self-pay

## 2014-03-22 DIAGNOSIS — Z720 Tobacco use: Secondary | ICD-10-CM | POA: Insufficient documentation

## 2014-03-22 DIAGNOSIS — M791 Myalgia: Secondary | ICD-10-CM | POA: Insufficient documentation

## 2014-03-22 DIAGNOSIS — R079 Chest pain, unspecified: Secondary | ICD-10-CM | POA: Insufficient documentation

## 2014-03-22 DIAGNOSIS — I10 Essential (primary) hypertension: Secondary | ICD-10-CM | POA: Insufficient documentation

## 2014-03-22 DIAGNOSIS — R0789 Other chest pain: Secondary | ICD-10-CM | POA: Insufficient documentation

## 2014-03-22 DIAGNOSIS — Z79899 Other long term (current) drug therapy: Secondary | ICD-10-CM | POA: Insufficient documentation

## 2014-03-22 DIAGNOSIS — Z880 Allergy status to penicillin: Secondary | ICD-10-CM | POA: Insufficient documentation

## 2014-03-22 DIAGNOSIS — Z973 Presence of spectacles and contact lenses: Secondary | ICD-10-CM | POA: Insufficient documentation

## 2014-03-22 DIAGNOSIS — Z792 Long term (current) use of antibiotics: Secondary | ICD-10-CM | POA: Insufficient documentation

## 2014-03-22 LAB — BASIC METABOLIC PANEL
ANION GAP: 10 (ref 5–15)
BUN: 10 mg/dL (ref 6–23)
CO2: 24 mmol/L (ref 19–32)
CREATININE: 1.16 mg/dL (ref 0.50–1.35)
Calcium: 8.8 mg/dL (ref 8.4–10.5)
Chloride: 100 mEq/L (ref 96–112)
GFR calc Af Amer: >90 mL/min (ref >90)
GFR, EST NON AFRICAN AMERICAN: 78 mL/min — AB (ref >90)
Glucose, Bld: 130 mg/dL — ABNORMAL HIGH (ref 70–99)
Potassium: 3.2 mmol/L — ABNORMAL LOW (ref 3.5–5.1)
SODIUM: 134 mmol/L — AB (ref 135–145)

## 2014-03-22 LAB — CBC
HCT: 48.4 % (ref 39.0–52.0)
Hemoglobin: 17.3 g/dL — ABNORMAL HIGH (ref 13.0–17.0)
MCH: 30.6 pg (ref 26.0–34.0)
MCHC: 35.7 g/dL (ref 30.0–36.0)
MCV: 85.7 fL (ref 78.0–100.0)
Platelets: 266 10*3/uL (ref 150–400)
RBC: 5.65 MIL/uL (ref 4.22–5.81)
RDW: 13.5 % (ref 11.5–15.5)
WBC: 8.7 10*3/uL (ref 4.0–10.5)

## 2014-03-22 LAB — I-STAT TROPONIN, ED
TROPONIN I, POC: 0.01 ng/mL (ref 0.00–0.08)
Troponin i, poc: 0.02 ng/mL (ref 0.00–0.08)

## 2014-03-22 MED ORDER — NAPROXEN 500 MG PO TABS
500.0000 mg | ORAL_TABLET | Freq: Once | ORAL | Status: AC
  Administered 2014-03-22: 500 mg via ORAL
  Filled 2014-03-22: qty 1

## 2014-03-22 MED ORDER — OXYCODONE-ACETAMINOPHEN 5-325 MG PO TABS
1.0000 | ORAL_TABLET | ORAL | Status: DC | PRN
Start: 2014-03-22 — End: 2015-01-10

## 2014-03-22 MED ORDER — HYDROCHLOROTHIAZIDE 25 MG PO TABS: 25.0000 mg | ORAL_TABLET | Freq: Every morning | ORAL | Status: DC

## 2014-03-22 MED ORDER — NAPROXEN 500 MG PO TABS: 500.0000 mg | ORAL_TABLET | Freq: Two times a day (BID) | ORAL | Status: DC

## 2014-03-22 MED ORDER — OXYCODONE-ACETAMINOPHEN 5-325 MG PO TABS
2.0000 | ORAL_TABLET | Freq: Once | ORAL | Status: AC
  Administered 2014-03-22: 2 via ORAL
  Filled 2014-03-22: qty 2

## 2014-03-22 MED ORDER — LISINOPRIL 20 MG PO TABS: 20.0000 mg | ORAL_TABLET | Freq: Every morning | ORAL | Status: DC

## 2014-03-22 MED ORDER — OXYCODONE-ACETAMINOPHEN 5-325 MG PO TABS: 2.0000 | ORAL_TABLET | ORAL | Status: DC | PRN

## 2014-03-22 NOTE — Discharge Instructions (Signed)
Please follow the directions provided.  Be sure to establish care with a primary care provider to ensure you are getting better and to help manage your blood pressure.  Take the naproxen twice a day for pain.  Take the blood pressure medicines as directed.  Don't hesitate to return for any new, worsening or concerning symptoms.    SEEK IMMEDIATE MEDICAL CARE IF:  You have pain that is getting worse and is not relieved by medications.  You develop chest pain that is associated with shortness or breath, sweating, feeling sick to your stomach (nauseous), or throw up (vomit).  Your pain becomes localized to the abdomen.  You develop any new symptoms that seem different or that concern you.   Emergency Department Resource Guide 1) Find a Doctor and Pay Out of Pocket Although you won't have to find out who is covered by your insurance plan, it is a good idea to ask around and get recommendations. You will then need to call the office and see if the doctor you have chosen will accept you as a new patient and what types of options they offer for patients who are self-pay. Some doctors offer discounts or will set up payment plans for their patients who do not have insurance, but you will need to ask so you aren't surprised when you get to your appointment.  2) Contact Your Local Health Department Not all health departments have doctors that can see patients for sick visits, but many do, so it is worth a call to see if yours does. If you don't know where your local health department is, you can check in your phone book. The CDC also has a tool to help you locate your state's health department, and many state websites also have listings of all of their local health departments.  3) Find a [facility] Clinic If your illness is not likely to be very severe or complicated, you may want to try a walk in clinic. These are popping up all over the country in pharmacies, drugstores, and shopping centers. They're usually  staffed by nurse practitioners or physician assistants that have been trained to treat common illnesses and complaints. They're usually fairly quick and inexpensive. However, if you have serious medical issues or chronic medical problems, these are probably not your best option.  No Primary Care Doctor: - Call Health Connect at  [phone] - they can help you locate a primary care doctor that  accepts your insurance, provides certain services, etc. - Physician Referral Service- [phone]  Chronic Pain Problems: Organization         Address  Phone   Notes  [facility]  [phone] Patients need to be referred by their primary care doctor.   Medication Assistance: Organization         Address  Phone   Notes  [facility] Medication [facility] [address] [place], [place] [address] [phone] --Must be a resident of [place] -- Must have NO insurance coverage whatsoever (no Medicaid/ Medicare, etc.) -- The pt. MUST have a primary care doctor that directs their care regularly and follows them in the community   MedAssist  [phone]   [employer]  [phone]    Agencies that provide inexpensive medical care: Organization         Address  Phone   Notes  [place]  [phone]   [facility]    [phone])  [facility] [place], [place] [address] [phone]   [place]. [address] [place] [phone]   Planned Parenthood    [phone]   [facility]    [phone]   [place] and [place] [address], [place] Phone:  [phone], Fax:  [phone] Hours of Operation:  9 am - 6 pm, M-F.  Also accepts Medicaid/Medicare and self-pay.  [facility] for [place], [address], [place] Phone: [phone], Fax: [phone]. Hours of  Operation:  8:30 am - 5:30 pm, M-F.  Also accepts Medicaid and self-pay.  [facility] [address] [place] Phone: [phone]   [place], [place], [place] [phone], Ext. [phone] Mondays & Thursdays: 7-9 AM.  First 15 patients are seen on a first come, first serve basis.    [place] Providers:  Organization         Address  Phone   Notes  [facility] [place] [address], [place] [phone] Also accepts self-pay patients.  [facility] [address], [place]  [phone]   [place], [address], [place] [phone]   [facility] [address], [place] [phone]   Lucianne Lei [address], [place]   [phone] Only accepts [place] Access [place] patients after they have their name applied to their card.   Self-Pay (no insurance) in [facility]:  Organization         Address  Phone   Notes  Sickle Cell Patients, [facility] [place] [phone]   [facility] [place] [phone]   [facility] [place]  [place], [place], [place] [phone]   [facility]/Dr. Osei-Bonsu  [address] [place] or [address], [place] [phone] Phone number for both [place] and [place] locations is the same.  Urgent Medical and [facility] [address] [place] [phone]   [facility] [address] [place] or [address] [phone]   [facility] [address] [place] [phone], phone; [phone], fax Sees patients 1st and 3rd Saturday of every month.  Must not qualify for public or private insurance (i.e. Medicaid, Medicare,  [place] Health Choice, Veterans' Benefits)  Household income should be no more than 200% of the poverty level The clinic cannot treat you if you are pregnant or think you are pregnant  Sexually transmitted diseases are not treated at the clinic.    Dental Care: Organization         Address  Phone  Notes  [facility] Department of [facility] [phone] Accepts children up to age 22 who are enrolled in [place] or [place]; pregnant women with a Medicaid card; and children who have applied for Medicaid or  Health Choice, but were declined, whose parents can pay a reduced fee at time of service.  [facility] Department of [facility]  [address], [place] [phone] Accepts children up to  age 23 who are enrolled in Medicaid or [place] Health Choice; pregnant women with a Medicaid card; and children who have applied for Medicaid or [place] Health Choice, but were declined, whose parents can pay a reduced fee at time of service.  [place] Adult Dental Access PROGRAM  [place] [phone] Patients are seen by appointment only. Walk-ins are not accepted. [place] will see patients 77 years of age and older. Monday - Tuesday (8am-5pm) Most Wednesdays (8:30-5pm) $30 per visit, cash only  [facility] Adult Dental Access PROGRAM  [address], [facility] [phone] Patients are seen by appointment only. Walk-ins are not accepted. [place] will see patients 64 years of age and older. One Wednesday Evening (Monthly: Volunteer Based).  $30 per visit, cash only  [place]  [phone] for adults; Children under age 75, call Graduate Pediatric Dentistry at [phone]. Children aged 23-14, please call [phone] to request a pediatric application.  Dental services are provided in all areas of dental care including fillings, crowns and bridges,  complete and partial dentures, implants, gum treatment, root canals, and extractions. Preventive care is also provided. Treatment is provided to both adults and children. Patients are selected via a lottery and there is often a waiting list.   [facility] [address], [place]  [phone] [email or web address]   [facility] [address] [place], [place] [phone], Ext. [phone] Second and Fourth Thursday of each month, opens at 6:30 AM; Clinic ends at 9 AM.  Patients are seen on a first-come first-served basis, and a limited number are seen during each clinic.   [facility]  [address] [place]ard Danker [place], [place] [phone]   Eligibility Requirements You must have lived in [place], [place], or [place] counties for at least the last three months.   You cannot be eligible for state or federal sponsored [employer], including [employer], [place], or [employer].   You generally cannot be eligible for healthcare insurance through your employer.    How to apply: Eligibility screenings are held every Tuesday and Wednesday afternoon from 1:00 pm until 4:00 pm. You do not need an appointment for the interview!  [facility] [address], [place], [place]   [place]  [place] Department  [place]  [phone]    Behavioral Health Resources in the Community: Intensive Outpatient Programs Organization         Address  Phone  Notes  [place]. [address] [place], [place] [phone]   [facility] Outpatient [address] [place], [place]   ADS: Alcohol & Drug Svcs [address] [place], [place]   [place] [address]  [place], [place] or [phone]   Substance Abuse Resources Organization          Address  Phone  Notes  Alcohol and Drug Services  [phone]   [place]  [phone]   [facility]   Chinita Pester  [phone]   Residential & Outpatient Substance Abuse Program  [phone]   Psychological Services Organization         Address  Phone  Notes  [place]  Narcissa  [place]   [place] [phone]  [address], [place] or [phone]    Mobile Crisis Teams Organization         Address  Phone  Notes  [facility], Mobile Crisis Care Unit  [phone]   [facility]  [address] [place], [place]   [place] [address] [place] [phone]    Self-Help/Support Groups Organization         Address  Phone             Notes  [place]. of [place] - variety of support groups  [place] Call for more information  Narcotics Anonymous (NA), [facility]aring [facility] [address], [employer] [place]  2 meetings at this location   Special educational needs teacher         Address  Phone  Notes  ASAP Residential Treatment [place],    [place]  [phone]   [facility]  [address] [place] [phone], [place], [place]   [place], [place] [phone] Admissions: 8am-3pm M-F  Incentives Substance [place] [address]    [place], [place] [phone]   [facility] [address] [place], [place], [place]   [facility] [address],  [place], [place]   [facility] - Intensive Outpatient [address] Kristeen [address], [place], [place]   [facility] [place].,  [place], [place] [phone] or [phone]   [facility] Services (RTS) [address] [place], [place] Accepts Medicaid  [facility] [place] [address],  [place] [phone] Substance Abuse/Addiction Treatment   [employer] [facility] Organization         Address  Phone  Notes  [facility]  [phone]   Domenic Schwab, PhD [address] Arlis [place], [place]   [phone] or [phone]   [place] [place] [address] [place], [place] [phone]   [facility] [address] [place], [place] [phone] Insurance/Medicaid/sponsorship through [facility] and Families [address], [address] [place]                                    [place], [place] [phone] [place] [address], [place] [phone]    Dr. Adele Schilder  [phone]   [facility] Dept. 1) [address] [place] 2) [place] 3)  [place] [address], [place] [phone]  [phone]   H. [place] [phone] or [phone] (After Hours)

## 2014-03-22 NOTE — ED Notes (Signed)
Pt states that yesterday around 5pm started having right side chest pain. States that it hurts to cough, laugh, belch.  Pt states that he works at [employer] and does a lot of heavy lifting.  Pt states that he took ibuprofen yesterday but didn't help with the pain.

## 2014-03-22 NOTE — ED Provider Notes (Signed)
CSN: [id number]     Arrival date & time 03/22/14  1949 History   First MD Initiated Contact with Patient 03/22/14 2103     Chief Complaint  Patient presents with  . Chest Pain  . Hypertension   (Consider location/radiation/quality/duration/timing/severity/associated sxs/prior Treatment) HPI [name] is a 39 yo male presenting with report of chest pain x 1 day.  He states he was watching tv when the pain started and it has been constant since then.  The pain does not radiate anywhere and hurts worse with moving, lifting, or if he coughs or burps.  He denies having a cough or frequent burping however.  He can point to exactly where the pain hurts him and he states it hurts worse when he pushes on that area on his right pectoralis muscle.  He works for [employer] and [employer] frequently. He denies any fevers, chills, nausea, vomiting, shortness of breath, blurred vision, focal deficit, cough, hemoptysis, recent surgery, or leg swelling or pain.    Past Medical History  Diagnosis Date  . Hypertension   . Wears glasses    Past Surgical History  Procedure Laterality Date  . Cholecystectomy     Family History  Problem Relation Age of Onset  . Cancer Mother    History  Substance Use Topics  . Smoking status: Current Every Day Smoker -- 1.00 packs/day    Types: Cigars  . Smokeless tobacco: Never Used  . Alcohol Use: No     Comment: occ    Review of Systems  Constitutional: Negative for fever and chills.  HENT: Negative for sore throat.   Eyes: Negative for visual disturbance.  Respiratory: Negative for cough and shortness of breath.   Cardiovascular: Positive for chest pain. Negative for leg swelling.  Gastrointestinal: Negative for nausea, vomiting and diarrhea.  Genitourinary: Negative for dysuria.  Musculoskeletal: Positive for myalgias.  Skin: Negative for rash.  Neurological: Negative for weakness, numbness and headaches.    Allergies  Penicillins  Home Medications     Prior to Admission medications   Medication Sig Start Date End Date Taking? Authorizing Provider  clindamycin (CLEOCIN) 150 MG capsule Take 2 capsules (300 mg total) by mouth 3 (three) times daily. 02/14/14   Shari A Upstill, PA-C  hydrochlorothiazide (HYDRODIURIL) 25 MG tablet Take 1 tablet (25 mg total) by mouth every morning. 01/20/14   Kalman Drape, MD  HYDROcodone-acetaminophen (NORCO/VICODIN) 5-325 MG per tablet Take 1-2 tablets by mouth every 4 (four) hours as needed. 02/14/14   Shari A Upstill, PA-C  ibuprofen (ADVIL,MOTRIN) 200 MG tablet Take 800 mg by mouth every 6 (six) hours as needed (pain).    Historical Provider, MD  lisinopril (PRINIVIL,ZESTRIL) 20 MG tablet Take 20 mg by mouth every morning.    Historical Provider, MD  lisinopril (PRINIVIL,ZESTRIL) 20 MG tablet Take 1 tablet (20 mg total) by mouth daily. 01/20/14   Kalman Drape, MD  naproxen (NAPROSYN) 500 MG tablet Take 1 tablet (500 mg total) by mouth 2 (two) times daily with a meal. 01/20/14   Kalman Drape, MD   BP 186/132 mmHg  Pulse 83  Temp(Src) 98.2 F (36.8 C) (Oral)  Resp 16  SpO2 100% Physical Exam  Constitutional: He appears well-developed and well-nourished. No distress.  HENT:  Head: Normocephalic and atraumatic.  Mouth/Throat: Oropharynx is clear and moist. No oropharyngeal exudate.  Eyes: Conjunctivae are normal.  Neck: Neck supple. No thyromegaly present.  Cardiovascular: Normal rate, regular rhythm and intact distal  pulses.  Exam reveals no gallop and no friction rub.   No murmur heard. Pulmonary/Chest: Effort normal and breath sounds normal. No respiratory distress. He has no wheezes. He has no rales. He exhibits tenderness.    Abdominal: Soft. There is no tenderness.  Musculoskeletal: He exhibits tenderness.  Lymphadenopathy:    He has no cervical adenopathy.  Neurological: He is alert.  Skin: Skin is warm and dry. No rash noted. He is not diaphoretic.  Psychiatric: He has a normal mood and  affect.  Nursing note and vitals reviewed.   ED Course  Procedures (including critical care time) Labs Review Labs Reviewed  CBC - Abnormal; Notable for the following:    Hemoglobin 17.3 (*)    All other components within normal limits  BASIC METABOLIC PANEL - Abnormal; Notable for the following:    Sodium 134 (*)    Potassium 3.2 (*)    Glucose, Bld 130 (*)    GFR calc non Af Amer 78 (*)    All other components within normal limits  Randolm Idol, ED    Imaging Review Dg Chest 2 View  03/22/2014   CLINICAL DATA:  Acute chest pain.  EXAM: CHEST  2 VIEW  COMPARISON:  January 20, 2014.  FINDINGS: The heart size and mediastinal contours are within normal limits. Both lungs are clear. No pneumothorax or pleural effusion is noted. The visualized skeletal structures are unremarkable.  IMPRESSION: No acute cardiopulmonary abnormality seen.   Electronically Signed   By: Sabino Dick M.D.   On: 03/22/2014 14:57     EKG Interpretation   Date/Time:  Monday March 22 2014 19:58:25 EST Ventricular Rate:  79 PR Interval:  197 QRS Duration: 94 QT Interval:  378 QTC Calculation: 433 R Axis:   35 Text Interpretation:  Sinus rhythm Left atrial enlargement Anterior  infarct, old Abnormal T, probable ischemia, lateral leads Baseline wander  in lead(s) V2 since last tracing no significant change Confirmed by BELFI   MD, MELANIE ([id number]) on 03/22/2014 8:02:01 PM      MDM   Final diagnoses:  Right-sided chest wall pain   39 yo male with reproducible chest pain. Chest pain does not seem to be cardiac or pulmonary in nature. He is perc negative, no new murmur, breath sounds clear and equal bilaterally, EKG without acute abnormalities, negative troponin, and negative CXR. Of note, his blood pressure is elevated. He has a history of high blood pressure that was diagnosed and meds prescribed during his ED visits.  He has not been followed by a PCP previously because he has not had insurance. He  will soon be able to start seeing the Occupation [facility] providers for further monitoring and management of his blood pressure.  Prescriptions of his HCTZ and Lisinopril refilled with instructions to take his home dose when he gets home. His pain was improved in the ED with pain meds and NSAIDs.  He has been advised to return to the ED if CP becomes exertional, associated with diaphoresis or nausea, radiates to left jaw/arm, worsens or becomes concerning in any way. Pt is well-appearing, and in no acute distress. He appears safe to be discharged. Pt appears reliable for follow up and is agreeable to discharge.  Case has been discussed with and seen by Dr. Tamera Punt who agrees with the above plan to discharge.    Filed Vitals:   03/22/14 2006 03/22/14 2007 03/22/14 2136 03/22/14 2212  BP: 207/130 186/132 202/128 187/131  Pulse: 83  75  65  Temp: 98.2 F (36.8 C)     TempSrc: Oral     Resp: 16  16 17   SpO2: 100%  96% 98%   Meds given in ED:  Medications  naproxen (NAPROSYN) tablet 500 mg (not administered)  oxyCODONE-acetaminophen (PERCOCET/ROXICET) 5-325 MG per tablet 2 tablet (not administered)    Discharge Medication List as of 03/22/2014 10:17 PM         Britt Bottom, NP 03/24/14 [place], MD 03/30/14 0740

## 2014-03-22 NOTE — ED Notes (Signed)
PA aware of pt's blood pressure, states that he is cleared to be discharged

## 2014-03-22 NOTE — ED Notes (Signed)
Pt reports chest tightness since yesterday.  Pt reports he was seen here today at 0100 but had an emergency and had to leave without finishing his treatment.  Pt reports pain is his chest is worse when lifting something, coughing or taking a deep breath.

## 2014-05-04 ENCOUNTER — Emergency Department (HOSPITAL_COMMUNITY): Payer: Self-pay

## 2014-05-04 ENCOUNTER — Emergency Department (HOSPITAL_COMMUNITY)
Admission: EM | Admit: 2014-05-04 | Discharge: 2014-05-04 | Disposition: A | Discharge status: Home / Self Care | Payer: Self-pay | Attending: Emergency Medicine | Admitting: Emergency Medicine

## 2014-05-04 ENCOUNTER — Encounter (HOSPITAL_COMMUNITY): Payer: Self-pay

## 2014-05-04 DIAGNOSIS — Z880 Allergy status to penicillin: Secondary | ICD-10-CM | POA: Insufficient documentation

## 2014-05-04 DIAGNOSIS — J111 Influenza due to unidentified influenza virus with other respiratory manifestations: Secondary | ICD-10-CM | POA: Insufficient documentation

## 2014-05-04 DIAGNOSIS — Z8701 Personal history of pneumonia (recurrent): Secondary | ICD-10-CM | POA: Insufficient documentation

## 2014-05-04 DIAGNOSIS — Z79899 Other long term (current) drug therapy: Secondary | ICD-10-CM | POA: Insufficient documentation

## 2014-05-04 DIAGNOSIS — I10 Essential (primary) hypertension: Secondary | ICD-10-CM | POA: Insufficient documentation

## 2014-05-04 DIAGNOSIS — Z720 Tobacco use: Secondary | ICD-10-CM | POA: Insufficient documentation

## 2014-05-04 LAB — BASIC METABOLIC PANEL
Anion gap: 7 (ref 5–15)
BUN: 14 mg/dL (ref 6–23)
CHLORIDE: 104 mmol/L (ref 96–112)
CO2: 28 mmol/L (ref 19–32)
Calcium: 9.3 mg/dL (ref 8.4–10.5)
Creatinine, Ser: 1.19 mg/dL (ref 0.50–1.35)
GFR calc Af Amer: 88 mL/min — ABNORMAL LOW (ref >90)
GFR calc non Af Amer: 76 mL/min — ABNORMAL LOW (ref >90)
Glucose, Bld: 86 mg/dL (ref 70–99)
POTASSIUM: 3.9 mmol/L (ref 3.5–5.1)
Sodium: 139 mmol/L (ref 135–145)

## 2014-05-04 LAB — URINE MICROSCOPIC-ADD ON

## 2014-05-04 LAB — URINALYSIS, ROUTINE W REFLEX MICROSCOPIC
Bilirubin Urine: NEGATIVE
GLUCOSE, UA: NEGATIVE mg/dL
HGB URINE DIPSTICK: NEGATIVE
Ketones, ur: NEGATIVE mg/dL
Leukocytes, UA: NEGATIVE
Nitrite: NEGATIVE
PH: 5.5 (ref 5.0–8.0)
PROTEIN: 30 mg/dL — AB
SPECIFIC GRAVITY, URINE: 1.023 (ref 1.005–1.030)
Urobilinogen, UA: 0.2 mg/dL (ref 0.0–1.0)

## 2014-05-04 LAB — CBC WITH DIFFERENTIAL/PLATELET
BASOS ABS: 0.1 10*3/uL (ref 0.0–0.1)
BASOS PCT: 1 % (ref 0–1)
Eosinophils Absolute: 0.1 10*3/uL (ref 0.0–0.7)
Eosinophils Relative: 1 % (ref 0–5)
HCT: 53.9 % — ABNORMAL HIGH (ref 39.0–52.0)
Hemoglobin: 19.4 g/dL — ABNORMAL HIGH (ref 13.0–17.0)
LYMPHS PCT: 43 % (ref 12–46)
Lymphs Abs: 2.9 10*3/uL (ref 0.7–4.0)
MCH: 30.6 pg (ref 26.0–34.0)
MCHC: 36 g/dL (ref 30.0–36.0)
MCV: 84.9 fL (ref 78.0–100.0)
Monocytes Absolute: 0.9 10*3/uL (ref 0.1–1.0)
Monocytes Relative: 13 % — ABNORMAL HIGH (ref 3–12)
Neutro Abs: 2.9 10*3/uL (ref 1.7–7.7)
Neutrophils Relative %: 42 % — ABNORMAL LOW (ref 43–77)
PLATELETS: 232 10*3/uL (ref 150–400)
RBC: 6.35 MIL/uL — ABNORMAL HIGH (ref 4.22–5.81)
RDW: 13.6 % (ref 11.5–15.5)
WBC: 6.9 10*3/uL (ref 4.0–10.5)

## 2014-05-04 MED ORDER — GUAIFENESIN 100 MG/5ML PO LIQD: 100.0000 mg | ORAL | Status: DC | PRN

## 2014-05-04 MED ORDER — LISINOPRIL 40 MG PO TABS: 40.0000 mg | ORAL_TABLET | Freq: Every day | ORAL | Status: DC

## 2014-05-04 MED ORDER — KETOROLAC TROMETHAMINE 60 MG/2ML IM SOLN
30.0000 mg | Freq: Once | INTRAMUSCULAR | Status: AC
  Administered 2014-05-04: 30 mg via INTRAMUSCULAR
  Filled 2014-05-04: qty 2

## 2014-05-04 NOTE — Discharge Instructions (Signed)
Influenza Influenza ("the flu") is a viral infection of the respiratory tract. It occurs more often in winter months because people spend more time in close contact with one another. Influenza can make you feel very sick. Influenza easily spreads from person to person (contagious). CAUSES  Influenza is caused by a virus that infects the respiratory tract. You can catch the virus by breathing in droplets from an infected person's cough or sneeze. You can also catch the virus by touching something that was recently contaminated with the virus and then touching your mouth, nose, or eyes. RISKS AND COMPLICATIONS You may be at risk for a more severe case of influenza if you smoke cigarettes, have diabetes, have chronic heart disease (such as heart failure) or lung disease (such as asthma), or if you have a weakened immune system. Elderly people and pregnant women are also at risk for more serious infections. The most common problem of influenza is a lung infection (pneumonia). Sometimes, this problem can require emergency medical care and may be life threatening. SIGNS AND SYMPTOMS  Symptoms typically last 4 to 10 days and may include:  Fever.  Chills.  Headache, body aches, and muscle aches.  Sore throat.  Chest discomfort and cough.  Poor appetite.  Weakness or feeling tired.  Dizziness.  Nausea or vomiting. DIAGNOSIS  Diagnosis of influenza is often made based on your history and a physical exam. A nose or throat swab test can be done to confirm the diagnosis. TREATMENT  In mild cases, influenza goes away on its own. Treatment is directed at relieving symptoms. For more severe cases, your health care provider may prescribe antiviral medicines to shorten the sickness. Antibiotic medicines are not effective because the infection is caused by a virus, not by bacteria. HOME CARE INSTRUCTIONS  Take medicines only as directed by your health care provider.  Use a cool mist humidifier to make  breathing easier.  Get plenty of rest until your temperature returns to normal. This usually takes 3 to 4 days.  Drink enough fluid to keep your urine clear or pale yellow.  Cover yourmouth and nosewhen coughing or sneezing,and wash your handswellto prevent thevirusfrom spreading.  Stay homefromwork orschool untilthe fever is gonefor at least 20full day. PREVENTION  An annual influenza vaccination (flu shot) is the best way to avoid getting influenza. An annual flu shot is now routinely recommended for all adults in the [place] IF:  You experiencechest pain, yourcough worsens,or you producemore mucus.  Youhave nausea,vomiting, ordiarrhea.  Your fever returns or gets worse. SEEK IMMEDIATE MEDICAL CARE IF:  You havetrouble breathing, you become short of breath,or your skin ornails becomebluish.  You have severe painor stiffnessin the neck.  You develop a sudden headache, or pain in the face or ear.  You have nausea or vomiting that you cannot control. MAKE SURE YOU:   Understand these instructions.  Will watch your condition.  Will get help right away if you are not doing well or get worse. Document Released: 02/24/2000 Document Revised: 07/13/2013 Document Reviewed: 05/28/2011 [facility] Patient Information 2015 [place], [place]. This information is not intended to replace advice given to you by your health care provider. Make sure you discuss any questions you have with your health care provider. Hypertension Hypertension, commonly called high blood pressure, is when the force of blood pumping through your arteries is too strong. Your arteries are the blood vessels that carry blood from your heart throughout your body. A blood pressure reading consists of  a higher number over a lower number, such as 110/72. The higher number (systolic) is the pressure inside your arteries when your heart pumps. The lower number (diastolic) is the pressure  inside your arteries when your heart relaxes. Ideally you want your blood pressure below 120/80. Hypertension forces your heart to work harder to pump blood. Your arteries may become narrow or stiff. Having hypertension puts you at risk for heart disease, stroke, and other problems.  RISK FACTORS Some risk factors for high blood pressure are controllable. Others are not.  Risk factors you cannot control include:   Race. You may be at higher risk if you are African American.  Age. Risk increases with age.  Gender. Men are at higher risk than women before age 55 years. After age 57, women are at higher risk than men. Risk factors you can control include:  Not getting enough exercise or physical activity.  Being overweight.  Getting too much fat, sugar, calories, or salt in your diet.  Drinking too much alcohol. SIGNS AND SYMPTOMS Hypertension does not usually cause signs or symptoms. Extremely high blood pressure (hypertensive crisis) may cause headache, anxiety, shortness of breath, and nosebleed. DIAGNOSIS  To check if you have hypertension, your health care provider will measure your blood pressure while you are seated, with your arm held at the level of your heart. It should be measured at least twice using the same arm. Certain conditions can cause a difference in blood pressure between your right and left arms. A blood pressure reading that is higher than normal on one occasion does not mean that you need treatment. If one blood pressure reading is high, ask your health care provider about having it checked again. TREATMENT  Treating high blood pressure includes making lifestyle changes and possibly taking medicine. Living a healthy lifestyle can help lower high blood pressure. You may need to change some of your habits. Lifestyle changes may include:  Following the DASH diet. This diet is high in fruits, vegetables, and whole grains. It is low in salt, red meat, and added  sugars.  Getting at least 2 hours of brisk physical activity every week.  Losing weight if necessary.  Not smoking.  Limiting alcoholic beverages.  Learning ways to reduce stress. If lifestyle changes are not enough to get your blood pressure under control, your health care provider may prescribe medicine. You may need to take more than one. Work closely with your health care provider to understand the risks and benefits. HOME CARE INSTRUCTIONS  Have your blood pressure rechecked as directed by your health care provider.   Take medicines only as directed by your health care provider. Follow the directions carefully. Blood pressure medicines must be taken as prescribed. The medicine does not work as well when you skip doses. Skipping doses also puts you at risk for problems.   Do not smoke.   Monitor your blood pressure at home as directed by your health care provider. SEEK MEDICAL CARE IF:   You think you are having a reaction to medicines taken.  You have recurrent headaches or feel dizzy.  You have swelling in your ankles.  You have trouble with your vision. SEEK IMMEDIATE MEDICAL CARE IF:  You develop a severe headache or confusion.  You have unusual weakness, numbness, or feel faint.  You have severe chest or abdominal pain.  You vomit repeatedly.  You have trouble breathing. MAKE SURE YOU:   Understand these instructions.  Will watch your condition.  Will get help right away if you are not doing well or get worse. Document Released: 02/26/2005 Document Revised: 07/13/2013 Document Reviewed: 12/19/2012 [facility] Patient Information 2015 [place], [place]. This information is not intended to replace advice given to you by your health care provider. Make sure you discuss any questions you have with your health care provider. DASH Eating Plan DASH stands for "Dietary Approaches to Stop Hypertension." The DASH eating plan is a healthy eating plan that has been  shown to reduce high blood pressure (hypertension). Additional health benefits may include reducing the risk of type 2 diabetes mellitus, heart disease, and stroke. The DASH eating plan may also help with weight loss. WHAT DO I NEED TO KNOW ABOUT THE DASH EATING PLAN? For the DASH eating plan, you will follow these general guidelines:  Choose foods with a percent daily value for sodium of less than 5% (as listed on the food label).  Use salt-free seasonings or herbs instead of table salt or sea salt.  Check with your health care provider or pharmacist before using salt substitutes.  Eat lower-sodium products, often labeled as "lower sodium" or "no salt added."  Eat fresh foods.  Eat more vegetables, fruits, and low-fat dairy products.  Choose whole grains. Look for the word "whole" as the first word in the ingredient list.  Choose fish and skinless chicken or [place] more often than red meat. Limit fish, poultry, and meat to 6 oz (170 g) each day.  Limit sweets, desserts, sugars, and sugary drinks.  Choose heart-healthy fats.  Limit cheese to 1 oz (28 g) per day.  Eat more home-cooked food and less restaurant, buffet, and fast food.  Limit fried foods.  Cook foods using methods other than frying.  Limit canned vegetables. If you do use them, rinse them well to decrease the sodium.  When eating at a restaurant, ask that your food be prepared with less salt, or no salt if possible. WHAT FOODS CAN I EAT? Seek help from a dietitian for individual calorie needs. Grains Whole grain or whole wheat bread. Brown rice. Whole grain or whole wheat pasta. Quinoa, bulgur, and whole grain cereals. Low-sodium cereals. Corn or whole wheat flour tortillas. Whole grain cornbread. Whole grain crackers. Low-sodium crackers. Vegetables Fresh or frozen vegetables (raw, steamed, roasted, or grilled). Low-sodium or reduced-sodium tomato and vegetable juices. Low-sodium or reduced-sodium tomato sauce  and paste. Low-sodium or reduced-sodium canned vegetables.  Fruits All fresh, canned (in natural juice), or frozen fruits. Meat and Other Protein Products Ground beef (85% or leaner), grass-fed beef, or beef trimmed of fat. Skinless chicken or [place]. Ground chicken or [place]. Pork trimmed of fat. All fish and seafood. Eggs. Dried beans, peas, or lentils. Unsalted nuts and seeds. Unsalted canned beans. Dairy Low-fat dairy products, such as skim or 1% milk, 2% or reduced-fat cheeses, low-fat ricotta or cottage cheese, or plain low-fat yogurt. Low-sodium or reduced-sodium cheeses. Fats and Oils Tub margarines without trans fats. Light or reduced-fat mayonnaise and salad dressings (reduced sodium). Avocado. Safflower, olive, or canola oils. Natural peanut or almond butter. Other Unsalted popcorn and pretzels. The items listed above may not be a complete list of recommended foods or beverages. Contact your dietitian for more options. WHAT FOODS ARE NOT RECOMMENDED? Grains White bread. White pasta. White rice. Refined cornbread. Bagels and croissants. Crackers that contain trans fat. Vegetables Creamed or fried vegetables. Vegetables in a cheese sauce. Regular canned vegetables. Regular canned tomato sauce and paste. Regular tomato and vegetable juices. Fruits  Dried fruits. Canned fruit in light or heavy syrup. Fruit juice. Meat and Other Protein Products Fatty cuts of meat. Ribs, chicken wings, bacon, sausage, bologna, salami, chitterlings, fatback, hot dogs, bratwurst, and packaged luncheon meats. Salted nuts and seeds. Canned beans with salt. Dairy Whole or 2% milk, cream, half-and-half, and cream cheese. Whole-fat or sweetened yogurt. Full-fat cheeses or blue cheese. Nondairy creamers and whipped toppings. Processed cheese, cheese spreads, or cheese curds. Condiments Onion and garlic salt, seasoned salt, table salt, and sea salt. Canned and packaged gravies. Worcestershire sauce. Tartar sauce.  Barbecue sauce. Teriyaki sauce. Soy sauce, including reduced sodium. Steak sauce. Fish sauce. Oyster sauce. Cocktail sauce. Horseradish. Ketchup and mustard. Meat flavorings and tenderizers. Bouillon cubes. Hot sauce. Tabasco sauce. Marinades. Taco seasonings. Relishes. Fats and Oils Butter, stick margarine, lard, shortening, ghee, and bacon fat. Coconut, palm kernel, or palm oils. Regular salad dressings. Other Pickles and olives. Salted popcorn and pretzels. The items listed above may not be a complete list of foods and beverages to avoid. Contact your dietitian for more information. WHERE CAN I FIND MORE INFORMATION? National Heart, Lung, and Blood Institute: travelstabloid.com Document Released: 02/15/2011 Document Revised: 07/13/2013 Document Reviewed: 12/31/2012 [facility] Patient Information 2015 [place], [place]. This information is not intended to replace advice given to you by your health care provider. Make sure you discuss any questions you have with your health care provider.

## 2014-05-04 NOTE — ED Notes (Signed)
Pt c/o "flu-like symptoms" x 2 days and hypertension.  Pain score 8/10.  Pt reports taking OTC medication w/o relief.  Hx of HTN.  Pt reports that he has been taking his medications as prescribed.

## 2014-05-04 NOTE — ED Provider Notes (Signed)
CSN: [id number]     Arrival date & time 05/04/14  1726 History   First MD Initiated Contact with Patient 05/04/14 1807     Chief Complaint  Patient presents with  . Cough  . Generalized Body Aches  . Hypertension   [name] is a 39 y.o. male who is a smoker with a history of hypertension and pneumonia who presents the emergency department complaining of flulike symptoms for the past 4 days. Patient is complaining of generalized body aches, chest congestion, productive cough, chills, subjective fever, headache and fatigue for the past 4 days. He complains of 8 out of 10 pain as his body aches. He did not get a flu shot this year. He reports using tylenol multi symptom at 9 am this morning and ibuprofen at 12 pm today with some relief. He reports taking his HTN medications as prescribed. He reports taking fosinopril and hydrochlorothiazide today. Reports eating and drinking okay today. The patient denies shortness of breath, palpitations, chest pain, wheezing, dysuria, hematuria, diarrhea, abdominal pain, nausea, vomiting or current headache.  (Consider location/radiation/quality/duration/timing/severity/associated sxs/prior Treatment) HPI  Past Medical History  Diagnosis Date  . Hypertension   . Wears glasses    Past Surgical History  Procedure Laterality Date  . Cholecystectomy     Family History  Problem Relation Age of Onset  . Cancer Mother    History  Substance Use Topics  . Smoking status: Current Every Day Smoker -- 1.00 packs/day    Types: Cigars  . Smokeless tobacco: Never Used  . Alcohol Use: No     Comment: occ    Review of Systems  Constitutional: Negative for fever and chills.  HENT: Positive for congestion and rhinorrhea. Negative for ear discharge, ear pain, mouth sores, sore throat and trouble swallowing.   Eyes: Negative for visual disturbance.  Respiratory: Positive for cough. Negative for shortness of breath and wheezing.   Cardiovascular: Negative  for chest pain, palpitations and leg swelling.  Gastrointestinal: Negative for nausea, vomiting, abdominal pain and diarrhea.  Genitourinary: Negative for dysuria, hematuria, decreased urine volume and difficulty urinating.  Musculoskeletal: Positive for myalgias and arthralgias. Negative for back pain, neck pain and neck stiffness.  Skin: Negative for rash.  Neurological: Positive for headaches. Negative for dizziness, syncope and light-headedness.      Allergies  Penicillins  Home Medications   Prior to Admission medications   Medication Sig Start Date End Date Taking? Authorizing Provider  hydrochlorothiazide (HYDRODIURIL) 25 MG tablet Take 1 tablet (25 mg total) by mouth every morning. 03/22/14  Yes Britt Bottom, NP  ibuprofen (ADVIL,MOTRIN) 200 MG tablet Take 400 mg by mouth every 6 (six) hours as needed for moderate pain (pain).    Yes Historical Provider, MD  clindamycin (CLEOCIN) 150 MG capsule Take 2 capsules (300 mg total) by mouth 3 (three) times daily. Patient not taking: Reported on 03/22/2014 02/14/14   Nehemiah Settle A Upstill, PA-C  guaiFENesin (ROBITUSSIN) 100 MG/5ML liquid Take 5-10 mLs (100-200 mg total) by mouth every 4 (four) hours as needed for cough. 05/04/14   Verda Cumins Nicey Krah, PA-C  HYDROcodone-acetaminophen (NORCO/VICODIN) 5-325 MG per tablet Take 1-2 tablets by mouth every 4 (four) hours as needed. Patient not taking: Reported on 05/04/2014 02/14/14   Nehemiah Settle A Upstill, PA-C  lisinopril (PRINIVIL,ZESTRIL) 40 MG tablet Take 1 tablet (40 mg total) by mouth daily. 05/04/14   Verda Cumins Inanna Telford, PA-C  naproxen (NAPROSYN) 500 MG tablet Take 1 tablet (500 mg total) by mouth 2 (two)  times daily with a meal. Patient not taking: Reported on 03/22/2014 01/20/14   Kalman Drape, MD  naproxen (NAPROSYN) 500 MG tablet Take 1 tablet (500 mg total) by mouth 2 (two) times daily. Patient not taking: Reported on 05/04/2014 03/22/14   Britt Bottom, NP  oxyCODONE-acetaminophen  (PERCOCET/ROXICET) 5-325 MG per tablet Take 1-2 tablets by mouth every 4 (four) hours as needed for severe pain. Patient not taking: Reported on 05/04/2014 03/22/14   Britt Bottom, NP   BP 189/122 mmHg  Pulse 76  Temp(Src) 98.4 F (36.9 C) (Oral)  Resp 18  SpO2 96% Physical Exam  Constitutional: He is oriented to person, place, and time. He appears well-developed and well-nourished. No distress.  HENT:  Head: Normocephalic and atraumatic.  Right Ear: External ear normal.  Left Ear: External ear normal.  Nose: Nose normal.  Mouth/Throat: Oropharynx is clear and moist. No oropharyngeal exudate.  Bilateral tympanic membranes are pearly-gray without erythema or loss of landmarks. No temporal edema.   Eyes: Conjunctivae are normal. Pupils are equal, round, and reactive to light. Right eye exhibits no discharge. Left eye exhibits no discharge.  Neck: Normal range of motion. Neck supple. No JVD present.  Cardiovascular: Normal rate, regular rhythm, normal heart sounds and intact distal pulses.  Exam reveals no gallop and no friction rub.   No murmur heard. Bilateral radial pulses are intact.   Pulmonary/Chest: Effort normal and breath sounds normal. No respiratory distress. He has no wheezes. He has no rales.  Abdominal: Soft. Bowel sounds are normal. He exhibits no distension and no mass. There is no tenderness. There is no rebound and no guarding.  Musculoskeletal: He exhibits no edema.  Lymphadenopathy:    He has no cervical adenopathy.  Neurological: He is alert and oriented to person, place, and time. Coordination normal.  Skin: Skin is warm and dry. No rash noted. He is not diaphoretic. No erythema. No pallor.  Psychiatric: He has a normal mood and affect. His behavior is normal.  Nursing note and vitals reviewed.   ED Course  Procedures (including critical care time) Labs Review Labs Reviewed  BASIC METABOLIC PANEL - Abnormal; Notable for the following:    GFR calc non Af  Amer 76 (*)    GFR calc Af Amer 88 (*)    All other components within normal limits  CBC WITH DIFFERENTIAL/PLATELET - Abnormal; Notable for the following:    RBC 6.35 (*)    Hemoglobin 19.4 (*)    HCT 53.9 (*)    Neutrophils Relative % 42 (*)    Monocytes Relative 13 (*)    All other components within normal limits  URINALYSIS, ROUTINE W REFLEX MICROSCOPIC - Abnormal; Notable for the following:    Protein, ur 30 (*)    All other components within normal limits  URINE MICROSCOPIC-ADD ON    Imaging Review Dg Chest 2 View  05/04/2014   CLINICAL DATA:  Cough for 2 days.  EXAM: CHEST  2 VIEW  COMPARISON:  March 22, 2014.  FINDINGS: The heart size and mediastinal contours are within normal limits. Both lungs are clear. No pneumothorax or pleural effusion is noted. The visualized skeletal structures are unremarkable.  IMPRESSION: No acute cardiopulmonary abnormality seen.   Electronically Signed   By: Marijo Conception, M.D.   On: 05/04/2014 19:45     EKG Interpretation   Date/Time:  Tuesday May 04 2014 18:54:02 EST Ventricular Rate:  78 PR Interval:  199 QRS Duration: 91 QT Interval:  382 QTC Calculation: 435 R Axis:   62 Text Interpretation:  Sinus rhythm Probable left atrial enlargement  Probable anterior infarct, age indeterminate Abnormal T, consider  ischemia, diffuse leads Lateral leads are also involved Baseline wander in  lead(s) II aVF similar to previous Confirmed by ZAVITZ  MD, JOSHUA ([id number])  on 05/04/2014 6:57:37 PM      Filed Vitals:   05/04/14 1730 05/04/14 2002 05/04/14 2010  BP: 205/139 180/120 189/122  Pulse: 84  76  Temp: 97.5 F (36.4 C) 98.4 F (36.9 C)   TempSrc: Oral Oral   Resp: 16 18 18   SpO2: 100% 100% 96%     MDM   Meds given in ED:  Medications  ketorolac (TORADOL) injection 30 mg (30 mg Intramuscular Given 05/04/14 1903)    New Prescriptions   GUAIFENESIN (ROBITUSSIN) 100 MG/5ML LIQUID    Take 5-10 mLs (100-200 mg total) by mouth  every 4 (four) hours as needed for cough.    Final diagnoses:  Influenza  Essential hypertension   This is a 39 y.o. male who is a smoker with a history of hypertension and pneumonia who presents the emergency department complaining of flulike symptoms for the past 4 days. Patient is complaining of generalized body aches, chest congestion, productive cough, chills, subjective fever, headache and fatigue for the past 4 days. Patient has history of hypertension reports taking his high blood pressure medicines as prescribed. Patient reports he's been eating and drinking okay. The patient is currently afebrile and nontoxic-appearing. Patient does have an initial blood pressure 205/139. Will check blood work and screening EKG due to high blood pressure. Patient's lungs are clear to auscultation bilaterally.  Patient's urinalysis is unremarkable. BMP is unremarkable. CBC shows a hemoglobin 19.4 with hematocrit of 53.9 and is otherwise unremarkable. Patient does not have an elevated white count. Chest x-ray is negative. The patient reports improvement with Toradol injection given in the ED. Patient has signs and symptoms of the flu. Patient's blood pressure improved to 180/120. Patient's pressure is still elevated. Patient's lab work shows no signs of end organ damage. Will increase his lisinopril to 40 mg once a day, and have him follow-up with her primary care provider. I provided him information on the wellness [facility] for follow-up. I advised the patient to follow-up with their primary care provider this week. I advised the patient to return to the emergency department with new or worsening symptoms or new concerns. The patient verbalized understanding and agreement with plan.   This patient was discussed with Dr. Wilson Singer who agrees with assessment and plan.     Hanley Hays, PA-C 05/04/14 2110  Virgel Manifold, MD 05/06/14 1100

## 2014-05-27 ENCOUNTER — Encounter (HOSPITAL_COMMUNITY): Payer: Self-pay

## 2014-05-27 ENCOUNTER — Emergency Department (HOSPITAL_COMMUNITY)
Admission: EM | Admit: 2014-05-27 | Discharge: 2014-05-28 | Disposition: A | Discharge status: Home / Self Care | Payer: Self-pay | Attending: Emergency Medicine | Admitting: Emergency Medicine

## 2014-05-27 DIAGNOSIS — Z880 Allergy status to penicillin: Secondary | ICD-10-CM | POA: Insufficient documentation

## 2014-05-27 DIAGNOSIS — Z79899 Other long term (current) drug therapy: Secondary | ICD-10-CM | POA: Insufficient documentation

## 2014-05-27 DIAGNOSIS — I10 Essential (primary) hypertension: Secondary | ICD-10-CM | POA: Insufficient documentation

## 2014-05-27 DIAGNOSIS — A084 Viral intestinal infection, unspecified: Secondary | ICD-10-CM

## 2014-05-27 DIAGNOSIS — Z9089 Acquired absence of other organs: Secondary | ICD-10-CM | POA: Insufficient documentation

## 2014-05-27 DIAGNOSIS — Z792 Long term (current) use of antibiotics: Secondary | ICD-10-CM | POA: Insufficient documentation

## 2014-05-27 DIAGNOSIS — Z791 Long term (current) use of non-steroidal anti-inflammatories (NSAID): Secondary | ICD-10-CM | POA: Insufficient documentation

## 2014-05-27 DIAGNOSIS — Z720 Tobacco use: Secondary | ICD-10-CM | POA: Insufficient documentation

## 2014-05-27 NOTE — ED Notes (Signed)
Pt states that he's been vomiting since last night, he came home from work and ate a frozen pizza before bed, he woke up vomiting and having watery diarrhea

## 2014-05-28 LAB — CBC WITH DIFFERENTIAL/PLATELET
Basophils Absolute: 0 10*3/uL (ref 0.0–0.1)
Basophils Relative: 1 % (ref 0–1)
EOS PCT: 3 % (ref 0–5)
Eosinophils Absolute: 0.1 10*3/uL (ref 0.0–0.7)
HEMATOCRIT: 49.3 % (ref 39.0–52.0)
Hemoglobin: 17.8 g/dL — ABNORMAL HIGH (ref 13.0–17.0)
Lymphocytes Relative: 41 % (ref 12–46)
Lymphs Abs: 2 10*3/uL (ref 0.7–4.0)
MCH: 31.1 pg (ref 26.0–34.0)
MCHC: 36.1 g/dL — ABNORMAL HIGH (ref 30.0–36.0)
MCV: 86 fL (ref 78.0–100.0)
MONO ABS: 0.8 10*3/uL (ref 0.1–1.0)
MONOS PCT: 16 % — AB (ref 3–12)
NEUTROS ABS: 1.9 10*3/uL (ref 1.7–7.7)
Neutrophils Relative %: 39 % — ABNORMAL LOW (ref 43–77)
Platelets: 227 10*3/uL (ref 150–400)
RBC: 5.73 MIL/uL (ref 4.22–5.81)
RDW: 13.8 % (ref 11.5–15.5)
WBC: 4.9 10*3/uL (ref 4.0–10.5)

## 2014-05-28 LAB — COMPREHENSIVE METABOLIC PANEL
ALBUMIN: 4 g/dL (ref 3.5–5.2)
ALK PHOS: 77 U/L (ref 39–117)
ALT: 43 U/L (ref 0–53)
AST: 37 U/L (ref 0–37)
Anion gap: 10 (ref 5–15)
BUN: 17 mg/dL (ref 6–23)
CO2: 21 mmol/L (ref 19–32)
CREATININE: 1.08 mg/dL (ref 0.50–1.35)
Calcium: 8.7 mg/dL (ref 8.4–10.5)
Chloride: 104 mmol/L (ref 96–112)
GFR calc non Af Amer: 85 mL/min — ABNORMAL LOW (ref >90)
Glucose, Bld: 92 mg/dL (ref 70–99)
POTASSIUM: 4.2 mmol/L (ref 3.5–5.1)
Sodium: 135 mmol/L (ref 135–145)
TOTAL PROTEIN: 7.8 g/dL (ref 6.0–8.3)
Total Bilirubin: 1.3 mg/dL — ABNORMAL HIGH (ref 0.3–1.2)

## 2014-05-28 LAB — LIPASE, BLOOD: Lipase: 18 U/L (ref 11–59)

## 2014-05-28 MED ORDER — ONDANSETRON HCL 4 MG/2ML IJ SOLN
4.0000 mg | Freq: Once | INTRAMUSCULAR | Status: AC
  Administered 2014-05-28: 4 mg via INTRAVENOUS
  Filled 2014-05-28: qty 2

## 2014-05-28 MED ORDER — SODIUM CHLORIDE 0.9 % IV BOLUS (SEPSIS)
1000.0000 mL | Freq: Once | INTRAVENOUS | Status: AC
  Administered 2014-05-28: 1000 mL via INTRAVENOUS

## 2014-05-28 MED ORDER — PROMETHAZINE HCL 25 MG PO TABS: 25.0000 mg | ORAL_TABLET | Freq: Four times a day (QID) | ORAL | Status: DC | PRN

## 2014-05-28 NOTE — ED Provider Notes (Signed)
CSN: [id number]     Arrival date & time 05/27/14  2226 History   First MD Initiated Contact with Patient 05/28/14 [phone]     Chief Complaint  Patient presents with  . Emesis     (Consider location/radiation/quality/duration/timing/severity/associated sxs/prior Treatment) HPI Comments: Patient is a 39 year old male who presents with a 2 days history of abdominal pain. The pain is located in the generalized abdomen and does not radiate. The pain is described as cramping and mild to moderate. The pain started gradually and progressively worsened since the onset. No alleviating/aggravating factors. The patient has tried pepto bismol for symptoms without relief. Associated symptoms include NVD. Patient denies fever, headache, chest pain, SOB, dysuria, constipation.   Patient is a 39 y.o. male presenting with vomiting.  Emesis Associated symptoms: abdominal pain and diarrhea   Associated symptoms: no arthralgias and no chills     Past Medical History  Diagnosis Date  . Hypertension   . Wears glasses    Past Surgical History  Procedure Laterality Date  . Cholecystectomy     Family History  Problem Relation Age of Onset  . Cancer Mother    History  Substance Use Topics  . Smoking status: Current Every Day Smoker -- 1.00 packs/day    Types: Cigars  . Smokeless tobacco: Never Used  . Alcohol Use: No     Comment: occ    Review of Systems  Constitutional: Negative for fever, chills and fatigue.  HENT: Negative for trouble swallowing.   Eyes: Negative for visual disturbance.  Respiratory: Negative for shortness of breath.   Cardiovascular: Negative for chest pain and palpitations.  Gastrointestinal: Positive for nausea, vomiting, abdominal pain and diarrhea.  Genitourinary: Negative for dysuria and difficulty urinating.  Musculoskeletal: Negative for arthralgias and neck pain.  Skin: Negative for color change.  Neurological: Negative for dizziness and weakness.   Psychiatric/Behavioral: Negative for dysphoric mood.      Allergies  Penicillins  Home Medications   Prior to Admission medications   Medication Sig Start Date End Date Taking? Authorizing Provider  bismuth subsalicylate (PEPTO BISMOL) 262 MG/15ML suspension Take 30 mLs by mouth every 6 (six) hours as needed for diarrhea or loose stools.   Yes Historical Provider, MD  hydrochlorothiazide (HYDRODIURIL) 25 MG tablet Take 1 tablet (25 mg total) by mouth every morning. 03/22/14  Yes Britt Bottom, NP  ibuprofen (ADVIL,MOTRIN) 200 MG tablet Take 400 mg by mouth every 6 (six) hours as needed for moderate pain (pain).    Yes Historical Provider, MD  lisinopril (PRINIVIL,ZESTRIL) 40 MG tablet Take 1 tablet (40 mg total) by mouth daily. 05/04/14  Yes Waynetta Pean, PA-C  clindamycin (CLEOCIN) 150 MG capsule Take 2 capsules (300 mg total) by mouth 3 (three) times daily. Patient not taking: Reported on 03/22/2014 02/14/14   Charlann Lange, PA-C  guaiFENesin (ROBITUSSIN) 100 MG/5ML liquid Take 5-10 mLs (100-200 mg total) by mouth every 4 (four) hours as needed for cough. Patient not taking: Reported on 05/28/2014 05/04/14   Waynetta Pean, PA-C  HYDROcodone-acetaminophen (NORCO/VICODIN) 5-325 MG per tablet Take 1-2 tablets by mouth every 4 (four) hours as needed. Patient not taking: Reported on 05/04/2014 02/14/14   Charlann Lange, PA-C  naproxen (NAPROSYN) 500 MG tablet Take 1 tablet (500 mg total) by mouth 2 (two) times daily with a meal. Patient not taking: Reported on 03/22/2014 01/20/14   Linton Flemings, MD  naproxen (NAPROSYN) 500 MG tablet Take 1 tablet (500 mg total) by mouth 2 (two) times  daily. Patient not taking: Reported on 05/04/2014 03/22/14   Britt Bottom, NP  oxyCODONE-acetaminophen (PERCOCET/ROXICET) 5-325 MG per tablet Take 1-2 tablets by mouth every 4 (four) hours as needed for severe pain. Patient not taking: Reported on 05/04/2014 03/22/14   Britt Bottom, NP   BP 176/125 mmHg   Pulse 69  Temp(Src) 98 F (36.7 C) (Oral)  Resp 15  Ht 6\' 2"  (1.88 m)  Wt 220 lb (99.791 kg)  BMI 28.23 kg/m2  SpO2 98% Physical Exam  Constitutional: He is oriented to person, place, and time. He appears well-developed and well-nourished. No distress.  HENT:  Head: Normocephalic and atraumatic.  Eyes: Conjunctivae and EOM are normal.  Neck: Normal range of motion.  Cardiovascular: Normal rate and regular rhythm.  Exam reveals no gallop and no friction rub.   No murmur heard. Pulmonary/Chest: Effort normal and breath sounds normal. He has no wheezes. He has no rales. He exhibits no tenderness.  Abdominal: Soft. He exhibits no distension. There is tenderness. There is no rebound.  Mild generalized tenderness to palpation. No focal tenderness.   Musculoskeletal: Normal range of motion.  Neurological: He is alert and oriented to person, place, and time. Coordination normal.  Speech is goal-oriented. Moves limbs without ataxia.   Skin: Skin is warm and dry.  Psychiatric: He has a normal mood and affect. His behavior is normal.  Nursing note and vitals reviewed.   ED Course  Procedures (including critical care time) Labs Review Labs Reviewed  CBC WITH DIFFERENTIAL/PLATELET - Abnormal; Notable for the following:    Hemoglobin 17.8 (*)    MCHC 36.1 (*)    Neutrophils Relative % 39 (*)    Monocytes Relative 16 (*)    All other components within normal limits  COMPREHENSIVE METABOLIC PANEL - Abnormal; Notable for the following:    Total Bilirubin 1.3 (*)    GFR calc non Af Amer 85 (*)    All other components within normal limits  LIPASE, BLOOD    Imaging Review No results found.   EKG Interpretation None      MDM   Final diagnoses:  Viral gastroenteritis    2:06 AM Labs pending. Patient will have fluids and zofran for symptoms. Vitals stable and patient afebrile.   3:28 AM Labs unremarkable for acute changes. Patient reports improvement of his symptoms. Patient  likely has viral gastroenteritis. Patient will be discharged with phenergan.    [address] [place], PA-C 05/28/14 [place], MD 05/28/14 [phone]

## 2014-05-28 NOTE — ED Notes (Addendum)
Patient states at work Wednesday he became "[place]". States he ate frozen pizza and went to bed. Woke up Thursday morning and vomited the pizza. Patient states he has had 4 episodes of vomiting and too many episodes of diarrhea "watery" to count. Patient states his stomach feels uneasy, his stomach is "turning". Patient c/o no appetite. 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 Patient denies knowledge of sick contacts. Recent contact with children, but states they are well. Patient states he was able to tolerate powerade.

## 2014-05-28 NOTE — Discharge Instructions (Signed)
Take phenergan as needed for nausea. Refer to attached documents for more information. Return to the ED with worsening or concerning symptoms.

## 2014-06-22 ENCOUNTER — Encounter (HOSPITAL_COMMUNITY): Payer: Self-pay | Admitting: Emergency Medicine

## 2014-06-22 ENCOUNTER — Emergency Department (HOSPITAL_COMMUNITY)
Admission: EM | Admit: 2014-06-22 | Discharge: 2014-06-22 | Disposition: A | Discharge status: Home / Self Care | Payer: Self-pay | Attending: Emergency Medicine | Admitting: Emergency Medicine

## 2014-06-22 ENCOUNTER — Emergency Department (HOSPITAL_COMMUNITY): Payer: Self-pay

## 2014-06-22 DIAGNOSIS — Z720 Tobacco use: Secondary | ICD-10-CM | POA: Insufficient documentation

## 2014-06-22 DIAGNOSIS — Z79899 Other long term (current) drug therapy: Secondary | ICD-10-CM | POA: Insufficient documentation

## 2014-06-22 DIAGNOSIS — R519 Headache, unspecified: Secondary | ICD-10-CM

## 2014-06-22 DIAGNOSIS — Z791 Long term (current) use of non-steroidal anti-inflammatories (NSAID): Secondary | ICD-10-CM | POA: Insufficient documentation

## 2014-06-22 DIAGNOSIS — I10 Essential (primary) hypertension: Secondary | ICD-10-CM | POA: Insufficient documentation

## 2014-06-22 DIAGNOSIS — Z792 Long term (current) use of antibiotics: Secondary | ICD-10-CM | POA: Insufficient documentation

## 2014-06-22 DIAGNOSIS — Z880 Allergy status to penicillin: Secondary | ICD-10-CM | POA: Insufficient documentation

## 2014-06-22 DIAGNOSIS — R51 Headache: Secondary | ICD-10-CM | POA: Insufficient documentation

## 2014-06-22 LAB — BASIC METABOLIC PANEL
Anion gap: 7 (ref 5–15)
BUN: 12 mg/dL (ref 6–23)
CALCIUM: 8.6 mg/dL (ref 8.4–10.5)
CO2: 25 mmol/L (ref 19–32)
CREATININE: 1.05 mg/dL (ref 0.50–1.35)
Chloride: 107 mmol/L (ref 96–112)
GFR calc non Af Amer: 87 mL/min — ABNORMAL LOW (ref >90)
Glucose, Bld: 97 mg/dL (ref 70–99)
Potassium: 4.2 mmol/L (ref 3.5–5.1)
Sodium: 139 mmol/L (ref 135–145)

## 2014-06-22 LAB — CBC
HEMATOCRIT: 45.8 % (ref 39.0–52.0)
Hemoglobin: 16.2 g/dL (ref 13.0–17.0)
MCH: 30.9 pg (ref 26.0–34.0)
MCHC: 35.4 g/dL (ref 30.0–36.0)
MCV: 87.2 fL (ref 78.0–100.0)
Platelets: 34 10*3/uL — ABNORMAL LOW (ref 150–400)
RBC: 5.25 MIL/uL (ref 4.22–5.81)
RDW: 14 % (ref 11.5–15.5)
WBC: 5.3 10*3/uL (ref 4.0–10.5)

## 2014-06-22 MED ORDER — DEXAMETHASONE SODIUM PHOSPHATE 10 MG/ML IJ SOLN
10.0000 mg | Freq: Once | INTRAMUSCULAR | Status: AC
  Administered 2014-06-22: 10 mg via INTRAVENOUS
  Filled 2014-06-22: qty 1

## 2014-06-22 MED ORDER — METOCLOPRAMIDE HCL 5 MG/ML IJ SOLN
10.0000 mg | Freq: Once | INTRAMUSCULAR | Status: AC
  Administered 2014-06-22: 10 mg via INTRAVENOUS
  Filled 2014-06-22: qty 2

## 2014-06-22 MED ORDER — LISINOPRIL 40 MG PO TABS: 40.0000 mg | ORAL_TABLET | Freq: Every day | ORAL | Status: DC

## 2014-06-22 MED ORDER — HYDROCHLOROTHIAZIDE 25 MG PO TABS
25.0000 mg | ORAL_TABLET | Freq: Every morning | ORAL | Status: DC
Start: 2014-06-22 — End: 2014-12-08

## 2014-06-22 MED ORDER — SODIUM CHLORIDE 0.9 % IV BOLUS (SEPSIS)
1000.0000 mL | Freq: Once | INTRAVENOUS | Status: AC
  Administered 2014-06-22: 1000 mL via INTRAVENOUS

## 2014-06-22 MED ORDER — DIPHENHYDRAMINE HCL 50 MG/ML IJ SOLN
25.0000 mg | Freq: Once | INTRAMUSCULAR | Status: AC
  Administered 2014-06-22: 25 mg via INTRAVENOUS
  Filled 2014-06-22: qty 1

## 2014-06-22 MED ORDER — MAGNESIUM SULFATE IN D5W 10-5 MG/ML-% IV SOLN
1.0000 g | Freq: Once | INTRAVENOUS | Status: AC
  Administered 2014-06-22: 1 g via INTRAVENOUS
  Filled 2014-06-22: qty 100

## 2014-06-22 NOTE — ED Notes (Signed)
Per lab, patient CMP hemolyzed. Will need to be recollected, primary RN notified.

## 2014-06-22 NOTE — ED Notes (Signed)
Patient c/o headache, onset Sunday. States he is out of his blood pressure medication, ran out on Saturday. Patient does not have PCP, gets his HTN meds from ER providers.

## 2014-06-22 NOTE — Discharge Instructions (Signed)
General Headache Without Cause A general headache is pain or discomfort felt around the head or neck area. The cause may not be found.  HOME CARE   Keep all doctor visits.  Only take medicines as told by your doctor.  Lie down in a dark, quiet room when you have a headache.  Keep a journal to find out if certain things bring on headaches. For example, write down:  What you eat and drink.  How much sleep you get.  Any change to your diet or medicines.  Relax by getting a massage or doing other relaxing activities.  Put ice or heat packs on the head and neck area as told by your doctor.  Lessen stress.  Sit up straight. Do not tighten (tense) your muscles.  Quit smoking if you smoke.  Lessen how much alcohol you drink.  Lessen how much caffeine you drink, or stop drinking caffeine.  Eat and sleep on a regular schedule.  Get 7 to 9 hours of sleep, or as told by your doctor.  Keep lights dim if bright lights bother you or make your headaches worse. GET HELP RIGHT AWAY IF:   Your headache becomes really bad.  You have a fever.  You have a stiff neck.  You have trouble seeing.  Your muscles are weak, or you lose muscle control.  You lose your balance or have trouble walking.  You feel like you will pass out (faint), or you pass out.  You have really bad symptoms that are different than your first symptoms.  You have problems with the medicines given to you by your doctor.  Your medicines do not work.  Your headache feels different than the other headaches.  You feel sick to your stomach (nauseous) or throw up (vomit). MAKE SURE YOU:   Understand these instructions.  Will watch your condition.  Will get help right away if you are not doing well or get worse. Document Released: 12/06/2007 Document Revised: 05/21/2011 Document Reviewed: 02/16/2011 [facility] Patient Information 2015 [place], [place]. This information is not intended to replace advice given to  you by your health care provider. Make sure you discuss any questions you have with your health care provider.  Hypertension Hypertension, commonly called high blood pressure, is when the force of blood pumping through your arteries is too strong. Your arteries are the blood vessels that carry blood from your heart throughout your body. A blood pressure reading consists of a higher number over a lower number, such as 110/72. The higher number (systolic) is the pressure inside your arteries when your heart pumps. The lower number (diastolic) is the pressure inside your arteries when your heart relaxes. Ideally you want your blood pressure below 120/80. Hypertension forces your heart to work harder to pump blood. Your arteries may become narrow or stiff. Having hypertension puts you at risk for heart disease, stroke, and other problems.  RISK FACTORS Some risk factors for high blood pressure are controllable. Others are not.  Risk factors you cannot control include:   Race. You may be at higher risk if you are African American.  Age. Risk increases with age.  Gender. Men are at higher risk than women before age 73 years. After age 79, women are at higher risk than men. Risk factors you can control include:  Not getting enough exercise or physical activity.  Being overweight.  Getting too much fat, sugar, calories, or salt in your diet.  Drinking too much alcohol. SIGNS AND SYMPTOMS Hypertension  does not usually cause signs or symptoms. Extremely high blood pressure (hypertensive crisis) may cause headache, anxiety, shortness of breath, and nosebleed. DIAGNOSIS  To check if you have hypertension, your health care provider will measure your blood pressure while you are seated, with your arm held at the level of your heart. It should be measured at least twice using the same arm. Certain conditions can cause a difference in blood pressure between your right and left arms. A blood pressure  reading that is higher than normal on one occasion does not mean that you need treatment. If one blood pressure reading is high, ask your health care provider about having it checked again. TREATMENT  Treating high blood pressure includes making lifestyle changes and possibly taking medicine. Living a healthy lifestyle can help lower high blood pressure. You may need to change some of your habits. Lifestyle changes may include:  Following the DASH diet. This diet is high in fruits, vegetables, and whole grains. It is low in salt, red meat, and added sugars.  Getting at least 2 hours of brisk physical activity every week.  Losing weight if necessary.  Not smoking.  Limiting alcoholic beverages.  Learning ways to reduce stress. If lifestyle changes are not enough to get your blood pressure under control, your health care provider may prescribe medicine. You may need to take more than one. Work closely with your health care provider to understand the risks and benefits. HOME CARE INSTRUCTIONS  Have your blood pressure rechecked as directed by your health care provider.   Take medicines only as directed by your health care provider. Follow the directions carefully. Blood pressure medicines must be taken as prescribed. The medicine does not work as well when you skip doses. Skipping doses also puts you at risk for problems.   Do not smoke.   Monitor your blood pressure at home as directed by your health care provider. SEEK MEDICAL CARE IF:   You think you are having a reaction to medicines taken.  You have recurrent headaches or feel dizzy.  You have swelling in your ankles.  You have trouble with your vision. SEEK IMMEDIATE MEDICAL CARE IF:  You develop a severe headache or confusion.  You have unusual weakness, numbness, or feel faint.  You have severe chest or abdominal pain.  You vomit repeatedly.  You have trouble breathing. MAKE SURE YOU:   Understand these  instructions.  Will watch your condition.  Will get help right away if you are not doing well or get worse. Document Released: 02/26/2005 Document Revised: 07/13/2013 Document Reviewed: 12/19/2012 [facility] Patient Information 2015 [place], [place]. This information is not intended to replace advice given to you by your health care provider. Make sure you discuss any questions you have with your health care provider.   Emergency Department Resource Guide 1) Find a Doctor and Pay Out of Pocket Although you won't have to find out who is covered by your insurance plan, it is a good idea to ask around and get recommendations. You will then need to call the office and see if the doctor you have chosen will accept you as a new patient and what types of options they offer for patients who are self-pay. Some doctors offer discounts or will set up payment plans for their patients who do not have insurance, but you will need to ask so you aren't surprised when you get to your appointment.  2) Contact Your Local Health Department Not all health departments have  doctors that can see patients for sick visits, but many do, so it is worth a call to see if yours does. If you don't know where your local health department is, you can check in your phone book. The CDC also has a tool to help you locate your state's health department, and many state websites also have listings of all of their local health departments.  3) Find a [facility] If your illness is not likely to be very severe or complicated, you may want to try a walk in clinic. These are popping up all over the country in pharmacies, drugstores, and shopping centers. They're usually staffed by nurse practitioners or physician assistants that have been trained to treat common illnesses and complaints. They're usually fairly quick and inexpensive. However, if you have serious medical issues or chronic medical problems, these are probably not your best  option.  No Primary Care Doctor: - Call Health Connect at  [phone] - they can help you locate a primary care doctor that  accepts your insurance, provides certain services, etc. - Physician Referral Service- [phone]  Chronic Pain Problems: Organization         Address  Phone   Notes  [facility]  [phone] Patients need to be referred by their primary care doctor.   Medication Assistance: Organization         Address  Phone   Notes  [facility] Medication [facility] [address] [place], [place] [address] [phone] --Must be a resident of [facility] -- Must have NO insurance coverage whatsoever (no Medicaid/ Medicare, etc.) -- The pt. MUST have a primary care doctor that directs their care regularly and follows them in the community   MedAssist  [phone]   [employer]  [phone]    Agencies that provide inexpensive medical care: Organization         Address  Phone   Notes  [place]  [phone]   [facility]    [phone]   [facility] [place], [place] [address] [phone]   [place] [address] [place] [phone]   Planned Parenthood    [phone]   [facility]    [phone]   [place] and [place] [address], [place] Phone:  [phone], Fax:  [phone] Hours of Operation:  9 am - 6 pm, M-F.  Also accepts Medicaid/Medicare and self-pay.  [facility] for [place] [place], [address], [place] Phone: [phone], Fax: [phone]. Hours of Operation:  8:30 am - 5:30 pm, M-F.  Also accepts Medicaid and self-pay.  [facility] [place] [address] [place] Phone: [phone]   [place], [place], [place] [phone], Ext. [phone] Mondays & Thursdays: 7-9 AM.  First 15  patients are seen on a first come, first serve basis.    [place] Providers:  Organization         Address  Phone   Notes  [facility] [address], [place] [phone] Also accepts self-pay patients.  [place], [place]  [phone]   [place], [address], [place] [phone]   [facility] [address]  [place], [place] [phone]   Lucianne Lei [address], [place]   [phone] Only accepts [place] Access [place] patients after they have their name applied to their card.   Self-Pay (no insurance) in [facility]:  Organization         Address  Phone   Notes  Sickle Cell Patients, [facility] [place] [phone]   [facility] [address] [phone]   [facility] Welcome  [place], [place], [place] [phone]   [facility]/Dr. Osei-Bonsu  [address], [place] or [address], [place] [phone] Phone number for both [place] and [place] locations is the same.  Urgent Medical and [facility] [address] [place] [phone]   [facility] [address], [place] or [address] [phone]   [facility] [address] [place] [phone], phone; [phone], fax Sees patients 1st and 3rd Saturday of every month.  Must not qualify for public or private insurance (i.e. Medicaid, Medicare, [place] Health Choice, Veterans' Benefits)  Household income should be no more than 200% of the poverty level The clinic cannot treat you if you are pregnant or think you are pregnant  Sexually transmitted diseases are not treated at the clinic.    Dental  Care: Organization         Address  Phone  Notes  [facility] Department of [facility] [place] [phone] Accepts children up to age 50 who are enrolled in [place] or [place]; pregnant women with a Medicaid card; and children who have applied for Medicaid or [place] Health Choice, but were declined, whose parents can pay a reduced fee at time of service.  [facility] Department of [facility]  [address], [place] [phone] Accepts children up to age 31 who are enrolled in [place] or [place]; pregnant women with a Medicaid card; and children who have applied for Medicaid or [place] Health Choice, but were declined, whose parents can pay a reduced fee at time of service.  [place] Adult Dental Access PROGRAM  [place] [phone] Patients are seen by appointment only. Walk-ins are not accepted. [place] will see patients 100 years of age and older. Monday - Tuesday (8am-5pm) Most Wednesdays (8:30-5pm) $30 per visit, cash only  [facility] Adult Dental Access PROGRAM  [address], [facility] [phone] Patients are seen by appointment only. Walk-ins are not accepted. [place] will see patients 41 years of age and older. One Wednesday Evening (Monthly: Volunteer Based).  $30 per visit, cash only  [place]  [phone] for adults; Children under age 88, call Graduate Pediatric Dentistry at [phone]. Children aged 36-14, please call [phone] to request a pediatric application.  Dental services are provided in all areas of dental care including fillings, crowns and bridges, complete and partial dentures, implants, gum treatment, root canals, and extractions. Preventive care is also provided. Treatment is provided to both adults and children. Patients are selected via a lottery and there is often a waiting list.   [facility] [address], [place]  [phone] [email or web address]  [address] [place], [place] [phone], [place] Thursday of each month, opens at 6:30 AM; Clinic ends at 9 AM.  Patients are seen on a first-come first-served basis, and a limited number are seen during each clinic.   [facility]  [address] Hillard Danker [place], [place] [phone]   Eligibility Requirements You must have lived in [place], [place], or [place] counties for at least the last three months.   You cannot be eligible for state or federal sponsored [employer], including [employer], [place], or [employer].   You generally cannot be eligible for healthcare insurance through your employer.    How to apply: Eligibility screenings are held every Tuesday and Wednesday afternoon from 1:00 pm until 4:00 pm. You do not need an appointment for the interview!  [facility] [address] [place], [place]   [place]  [place] Department  [place]  [phone]    Behavioral Health Resources in the Community: Intensive Outpatient Programs Organization         Address  Phone  Notes  [place]. [address], [place], [place] [phone]   [facility] [address] [place], [place]   ADS: Alcohol & Drug Svcs [address], [place], [place]   [place] [address],  [place], [place] or [phone]   Substance Abuse Resources Organization         Address  Phone  Notes  Alcohol and Drug Services  [phone]   [place]  [phone]   [facility] [place]   Chinita Pester  [phone]   Residential & Outpatient Substance Abuse Program  [phone]    Psychological Services Organization         Address  Phone  Notes  [facility] [place]  [place]  [phone]   [place] [address], [place] or [phone]    Mobile Crisis Teams Organization         Address  Phone  Notes  [facility], Mobile Crisis Care Unit  [phone]   [facility]  [address] [place], [place]   [place] [address] [place] [phone]    Self-Help/Support Groups Organization         Address  Phone             Notes  [place]. of [place] - variety of support groups  [place] Call for more information  Narcotics Anonymous (NA), [facility] [address], [employer] [place]  2 meetings at this location   Special educational needs teacher         Address  Phone  Notes  [facility] [place],    [place]  [phone]   [facility]  [address], [place] [phone], [place], [place]   [place], [place] [phone] Admissions: 8am-3pm M-F  Incentives Substance [place] [address]    [place], [place] [phone]   [facility] [address] Jadene Pierini [place], [place]   [facility] [address]  [place], [place] - Intensive Outpatient [address] Kristeen Mans [place], [place]  [phone]   [facility] ([place].,  [place], [place] [phone] or [phone]   [facility] Services (RTS) [address], [place], [place] Accepts Medicaid  [facility] [place] [address],  [place] [phone] Substance Abuse/Addiction Treatment   [facility] Organization         Address  Phone  Notes  [facility]  [phone]   Domenic Schwab, PhD [address] Arlis [place], [place]   [phone] or [phone]   [place], [place] [phone]   [place] [address], [place], [place] [phone] Insurance/Medicaid/sponsorship through [facility] and Families [address] [place]                                    [place], [place] [phone] [place] [address], [place] [phone]    Dr. Adele Schilder  [phone]   [facility] Dept. 1) [address], [place] 2) [place] 3)  [address], [place] [phone]  [phone]   [place] [phone] or [phone] (After Hours)

## 2014-06-22 NOTE — ED Notes (Signed)
Pt verbalized understanding of discharge instructions including follow up.

## 2014-06-22 NOTE — ED Provider Notes (Signed)
CSN: [id number]     Arrival date & time 06/22/14  0047 History   First MD Initiated Contact with Patient 06/22/14 [phone]     Chief Complaint  Patient presents with  . Headache     (Consider location/radiation/quality/duration/timing/severity/associated sxs/prior Treatment) Patient is a 39 y.o. male presenting with headaches. The history is provided by the patient.  Headache Pain location:  Frontal Quality:  Dull Radiates to:  Does not radiate Onset quality:  Gradual Duration:  2 days Timing:  Constant Progression:  Worsening Chronicity:  New Similar to prior headaches: no   Worsened by:  Nothing Ineffective treatments:  None tried Associated symptoms: no cough and no fever     Past Medical History  Diagnosis Date  . Hypertension   . Wears glasses    Past Surgical History  Procedure Laterality Date  . Cholecystectomy     Family History  Problem Relation Age of Onset  . Cancer Mother    History  Substance Use Topics  . Smoking status: Current Every Day Smoker -- 1.00 packs/day    Types: Cigars  . Smokeless tobacco: Never Used  . Alcohol Use: No     Comment: occ    Review of Systems  Constitutional: Negative for fever.  Respiratory: Negative for cough and shortness of breath.   Neurological: Positive for headaches.  All other systems reviewed and are negative.     Allergies  Penicillins  Home Medications   Prior to Admission medications   Medication Sig Start Date End Date Taking? Authorizing Provider  bismuth subsalicylate (PEPTO BISMOL) 262 MG/15ML suspension Take 30 mLs by mouth every 6 (six) hours as needed for diarrhea or loose stools.   Yes Historical Provider, MD  hydrochlorothiazide (HYDRODIURIL) 25 MG tablet Take 1 tablet (25 mg total) by mouth every morning. 03/22/14  Yes Britt Bottom, NP  ibuprofen (ADVIL,MOTRIN) 200 MG tablet Take 400 mg by mouth every 6 (six) hours as needed for moderate pain (pain).    Yes Historical Provider, MD   lisinopril (PRINIVIL,ZESTRIL) 40 MG tablet Take 1 tablet (40 mg total) by mouth daily. 05/04/14  Yes Waynetta Pean, PA-C  clindamycin (CLEOCIN) 150 MG capsule Take 2 capsules (300 mg total) by mouth 3 (three) times daily. Patient not taking: Reported on 03/22/2014 02/14/14   Charlann Lange, PA-C  guaiFENesin (ROBITUSSIN) 100 MG/5ML liquid Take 5-10 mLs (100-200 mg total) by mouth every 4 (four) hours as needed for cough. Patient not taking: Reported on 05/28/2014 05/04/14   Waynetta Pean, PA-C  HYDROcodone-acetaminophen (NORCO/VICODIN) 5-325 MG per tablet Take 1-2 tablets by mouth every 4 (four) hours as needed. Patient not taking: Reported on 05/04/2014 02/14/14   Charlann Lange, PA-C  naproxen (NAPROSYN) 500 MG tablet Take 1 tablet (500 mg total) by mouth 2 (two) times daily with a meal. Patient not taking: Reported on 03/22/2014 01/20/14   Linton Flemings, MD  naproxen (NAPROSYN) 500 MG tablet Take 1 tablet (500 mg total) by mouth 2 (two) times daily. Patient not taking: Reported on 05/04/2014 03/22/14   Britt Bottom, NP  oxyCODONE-acetaminophen (PERCOCET/ROXICET) 5-325 MG per tablet Take 1-2 tablets by mouth every 4 (four) hours as needed for severe pain. Patient not taking: Reported on 05/04/2014 03/22/14   Britt Bottom, NP  promethazine (PHENERGAN) 25 MG tablet Take 1 tablet (25 mg total) by mouth every 6 (six) hours as needed for nausea or vomiting. Patient not taking: Reported on 06/22/2014 05/28/14   Kaitlyn Szekalski, PA-C   BP 161/112 mmHg  Pulse  60  Temp(Src) 98 F (36.7 C) (Oral)  Resp 13  SpO2 98% Physical Exam  Constitutional: He is oriented to person, place, and time. He appears well-developed and well-nourished. No distress.  HENT:  Head: Normocephalic and atraumatic.  Mouth/Throat: Oropharynx is clear and moist. No oropharyngeal exudate.  Eyes: EOM are normal. Pupils are equal, round, and reactive to light.  Neck: Normal range of motion. Neck supple.  Cardiovascular: Normal  rate and regular rhythm.  Exam reveals no friction rub.   No murmur heard. Pulmonary/Chest: Effort normal and breath sounds normal. No respiratory distress. He has no wheezes. He has no rales.  Abdominal: Soft. He exhibits no distension. There is no tenderness. There is no rebound.  Musculoskeletal: Normal range of motion. He exhibits no edema.  Neurological: He is alert and oriented to person, place, and time. No cranial nerve deficit. He exhibits normal muscle tone. Coordination normal.  Skin: Skin is warm. No rash noted. He is not diaphoretic.  Nursing note and vitals reviewed.   ED Course  Procedures (including critical care time) Labs Review Labs Reviewed  CBC - Abnormal; Notable for the following:    Platelets 34 (*)    All other components within normal limits  BASIC METABOLIC PANEL - Abnormal; Notable for the following:    GFR calc non Af Amer 87 (*)    All other components within normal limits    Imaging Review Ct Head Wo Contrast  06/22/2014   CLINICAL DATA:  Headache around the temples for 4 days.  EXAM: CT HEAD WITHOUT CONTRAST  TECHNIQUE: Contiguous axial images were obtained from the base of the skull through the vertex without intravenous contrast.  COMPARISON:  None.  FINDINGS: Skull and Sinuses:There is a polypoid opacity in the upper right nasal cavity with loss of the upper bony nasal septum. The structure measures 15 mm in height by 9 mm in maximal thickness. No erosion of the anterior cranial fossa floor. No sinusitis.  Orbits: No acute abnormality.  Brain: No evidence of acute infarction, hemorrhage, hydrocephalus, or mass lesion/mass effect.  IMPRESSION: 1. Negative intracranial imaging. 2. 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 Recommend ENT referral for visualization.   Electronically Signed   By: Monte Fantasia M.D.   On: 06/22/2014 05:06     EKG Interpretation None      MDM   Final diagnoses:  Headache    24M here with  hypertension and headache. Headache for past few days. Hasn't been taking his anti-hypertensives in the past for weeks. Diffuse headache, gradual onset. No N/V, visual disturbances, fever. No abdominal pain. Here with hypertension. Normal cranial nerves, normal strength and sensation. No neurologic deficits on my exam. Given headache cocktail with some resolution of his headache. BP improved with relief. CT Head obtained since he's doesn't have chronic migraines - no intracranial abnormality. I refilled his HTN meds - HCTZ and lisinopril. Stable for discharge.    Evelina Bucy, MD 06/22/14 Drema Halon

## 2014-06-22 NOTE — ED Notes (Signed)
Pt to CT at this time.

## 2014-07-19 ENCOUNTER — Encounter (HOSPITAL_COMMUNITY): Payer: Self-pay | Admitting: Emergency Medicine

## 2014-07-19 ENCOUNTER — Emergency Department (HOSPITAL_COMMUNITY)
Admission: EM | Admit: 2014-07-19 | Discharge: 2014-07-19 | Disposition: A | Discharge status: Home / Self Care | Payer: Self-pay | Attending: Emergency Medicine | Admitting: Emergency Medicine

## 2014-07-19 DIAGNOSIS — Z79899 Other long term (current) drug therapy: Secondary | ICD-10-CM | POA: Insufficient documentation

## 2014-07-19 DIAGNOSIS — Z880 Allergy status to penicillin: Secondary | ICD-10-CM | POA: Insufficient documentation

## 2014-07-19 DIAGNOSIS — I10 Essential (primary) hypertension: Secondary | ICD-10-CM | POA: Insufficient documentation

## 2014-07-19 DIAGNOSIS — Z720 Tobacco use: Secondary | ICD-10-CM | POA: Insufficient documentation

## 2014-07-19 DIAGNOSIS — M25532 Pain in left wrist: Secondary | ICD-10-CM | POA: Insufficient documentation

## 2014-07-19 MED ORDER — HYDROCODONE-ACETAMINOPHEN 5-325 MG PO TABS
2.0000 | ORAL_TABLET | Freq: Once | ORAL | Status: AC
  Administered 2014-07-19: 2 via ORAL
  Filled 2014-07-19: qty 2

## 2014-07-19 MED ORDER — AMLODIPINE BESYLATE 10 MG PO TABS: 10.0000 mg | ORAL_TABLET | Freq: Every day | ORAL | Status: DC

## 2014-07-19 MED ORDER — NAPROXEN 500 MG PO TABS: 500.0000 mg | ORAL_TABLET | Freq: Two times a day (BID) | ORAL | Status: DC

## 2014-07-19 NOTE — ED Provider Notes (Signed)
CSN: [id number]     Arrival date & time 07/19/14  1505 History  This chart was scribed for non-physician practitioner Hyman Bible, PA-C working with Virgel Manifold, MD by Hilda Lias, ED Scribe. This patient was seen in room WTR5/WTR5 and the patient's care was started at 3:25 PM.    No chief complaint on file.     The history is provided by the patient. No language interpreter was used.     HPI Comments: [name] is a 39 y.o. male who presents to the Emergency Department complaining of constant, worsening left wrist pain that has been present for a few weeks. 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 Pt notes that pain worsens with extension of the wrist. Pt reports that he was sent home yesterday because he could not lift an object that was around 5 pounds. Pt states that it hurts to drive with his left hand as well. Pt states that he has been taking Ibuprofen regularly with little relief. Pt denies any known injury. Pt denies numbness or tingling.  No swelling or erythema of the wrist.      Past Medical History  Diagnosis Date  . Hypertension   . Wears glasses    Past Surgical History  Procedure Laterality Date  . Cholecystectomy     Family History  Problem Relation Age of Onset  . Cancer Mother    History  Substance Use Topics  . Smoking status: Current Every Day Smoker -- 1.00 packs/day    Types: Cigars  . Smokeless tobacco: Never Used  . Alcohol Use: No     Comment: occ    Review of Systems  HENT: Negative for voice change.   Cardiovascular: Negative for chest pain.  Musculoskeletal: Positive for arthralgias.  Neurological: Negative for numbness and headaches.      Allergies  Penicillins  Home Medications   Prior to Admission medications   Medication Sig Start Date End Date Taking? Authorizing Provider  bismuth subsalicylate (PEPTO BISMOL) 262 MG/15ML suspension Take 30 mLs by mouth every 6 (six) hours as needed  for diarrhea or loose stools.    Historical Provider, MD  clindamycin (CLEOCIN) 150 MG capsule Take 2 capsules (300 mg total) by mouth 3 (three) times daily. Patient not taking: Reported on 03/22/2014 02/14/14   Charlann Lange, PA-C  guaiFENesin (ROBITUSSIN) 100 MG/5ML liquid Take 5-10 mLs (100-200 mg total) by mouth every 4 (four) hours as needed for cough. Patient not taking: Reported on 05/28/2014 05/04/14   Waynetta Pean, PA-C  hydrochlorothiazide (HYDRODIURIL) 25 MG tablet Take 1 tablet (25 mg total) by mouth every morning. 06/22/14   Evelina Bucy, MD  HYDROcodone-acetaminophen (NORCO/VICODIN) 5-325 MG per tablet Take 1-2 tablets by mouth every 4 (four) hours as needed. Patient not taking: Reported on 05/04/2014 02/14/14   Charlann Lange, PA-C  ibuprofen (ADVIL,MOTRIN) 200 MG tablet Take 400 mg by mouth every 6 (six) hours as needed for moderate pain (pain).     Historical Provider, MD  lisinopril (PRINIVIL,ZESTRIL) 40 MG tablet Take 1 tablet (40 mg total) by mouth daily. 06/22/14   Evelina Bucy, MD  naproxen (NAPROSYN) 500 MG tablet Take 1 tablet (500 mg total) by mouth 2 (two) times daily with a meal. Patient not taking: Reported on 03/22/2014 01/20/14   Linton Flemings, MD  naproxen (NAPROSYN) 500 MG tablet Take 1 tablet (500 mg total) by mouth 2 (two) times daily. Patient not taking: Reported on 05/04/2014 03/22/14  Britt Bottom, NP  oxyCODONE-acetaminophen (PERCOCET/ROXICET) 5-325 MG per tablet Take 1-2 tablets by mouth every 4 (four) hours as needed for severe pain. Patient not taking: Reported on 05/04/2014 03/22/14   Britt Bottom, NP  promethazine (PHENERGAN) 25 MG tablet Take 1 tablet (25 mg total) by mouth every 6 (six) hours as needed for nausea or vomiting. Patient not taking: Reported on 06/22/2014 05/28/14   Kaitlyn Szekalski, PA-C   BP 192/133 mmHg  Pulse 72  Temp(Src) 98 F (36.7 C) (Oral)  Resp 16  SpO2 99% Physical Exam  Constitutional: He appears well-developed and  well-nourished.  HENT:  Head: Normocephalic and atraumatic.  Mouth/Throat: Oropharynx is clear and moist.  Neck: Normal range of motion. Neck supple.  Cardiovascular: Normal rate, regular rhythm and normal heart sounds.   Pulmonary/Chest: Effort normal and breath sounds normal.  Musculoskeletal: Normal range of motion.  Full ROM of left wrist No erythema, warmth, or edema of the left wrist 2+ radial pulse  Negative Finkelstein sign Negative Phalen sign  Negative Tinel sign  Neurological: He is alert.  Distal sensation of left hand intact  Skin: Skin is warm and dry.  Psychiatric: He has a normal mood and affect.  Nursing note and vitals reviewed.   ED Course  Procedures (including critical care time)  DIAGNOSTIC STUDIES: Oxygen Saturation is 95% on room air, normal by my interpretation.    COORDINATION OF CARE: 3:32 PM Discussed treatment plan with pt at bedside and pt agreed to plan.    Labs Review Labs Reviewed - No data to display  Imaging Review No results found.   EKG Interpretation None      MDM   Final diagnoses:  None   Patient presents today with left wrist pain.  No injury or trauma.  No signs of infection.  Suspect overuse injury.  Patient neurovascularly intact.  Patient given wrist splint.  Patient also found to be very hypertensive in the ED.  He denies chest pain, vision changes, headache, or decreased urination.  He is not having any symptoms aside from the wrist pain.  Review of the chart shows that patient has been very hypertensive in the ED in the past.  Patient reports that he is compliant with his HCTZ and Lisinopril.  Added Norvasc to current regimen.  Patient given follow up with [facility].  Patient stable for discharge.  Return precautions given.  Patient discussed with Dr. Wilson Singer who is in agreement with the plan.   Hyman Bible, PA-C 07/19/14 1933  Virgel Manifold, MD 07/21/14 1345

## 2014-07-19 NOTE — ED Notes (Signed)
Pt reports left wrist pain for a week; denies injury. Pt full ROM of left wrist but reports "painful."

## 2014-07-29 ENCOUNTER — Emergency Department (HOSPITAL_COMMUNITY): Payer: Self-pay

## 2014-07-29 ENCOUNTER — Encounter (HOSPITAL_COMMUNITY): Payer: Self-pay

## 2014-07-29 ENCOUNTER — Emergency Department (HOSPITAL_COMMUNITY)
Admission: EM | Admit: 2014-07-29 | Discharge: 2014-07-29 | Disposition: A | Discharge status: Home / Self Care | Payer: Self-pay | Attending: Emergency Medicine | Admitting: Emergency Medicine

## 2014-07-29 DIAGNOSIS — Z973 Presence of spectacles and contact lenses: Secondary | ICD-10-CM | POA: Insufficient documentation

## 2014-07-29 DIAGNOSIS — Z79899 Other long term (current) drug therapy: Secondary | ICD-10-CM | POA: Insufficient documentation

## 2014-07-29 DIAGNOSIS — Z720 Tobacco use: Secondary | ICD-10-CM | POA: Insufficient documentation

## 2014-07-29 DIAGNOSIS — M25532 Pain in left wrist: Secondary | ICD-10-CM | POA: Insufficient documentation

## 2014-07-29 DIAGNOSIS — Z880 Allergy status to penicillin: Secondary | ICD-10-CM | POA: Insufficient documentation

## 2014-07-29 DIAGNOSIS — R030 Elevated blood-pressure reading, without diagnosis of hypertension: Secondary | ICD-10-CM

## 2014-07-29 DIAGNOSIS — Z791 Long term (current) use of non-steroidal anti-inflammatories (NSAID): Secondary | ICD-10-CM | POA: Insufficient documentation

## 2014-07-29 DIAGNOSIS — I10 Essential (primary) hypertension: Secondary | ICD-10-CM | POA: Insufficient documentation

## 2014-07-29 DIAGNOSIS — IMO0001 Reserved for inherently not codable concepts without codable children: Secondary | ICD-10-CM

## 2014-07-29 MED ORDER — CLONIDINE HCL 0.1 MG PO TABS
0.2000 mg | ORAL_TABLET | Freq: Once | ORAL | Status: AC
  Administered 2014-07-29: 0.2 mg via ORAL
  Filled 2014-07-29: qty 2

## 2014-07-29 MED ORDER — KETOROLAC TROMETHAMINE 60 MG/2ML IM SOLN
60.0000 mg | Freq: Once | INTRAMUSCULAR | Status: AC
  Administered 2014-07-29: 60 mg via INTRAMUSCULAR
  Filled 2014-07-29: qty 2

## 2014-07-29 NOTE — ED Provider Notes (Signed)
CSN: [id number]     Arrival date & time 07/29/14  0321 History   First MD Initiated Contact with Patient 07/29/14 0330     Chief Complaint  Patient presents with  . Wrist Pain     (Consider location/radiation/quality/duration/timing/severity/associated sxs/prior Treatment) HPI Patient presents with ongoing left wrist pain that he states has been present for the past 2 weeks. Gradual onset with no history of trauma. Patient states he works at [employer] lots of heavy lifting. Was seen in the emergency department on 5/9. Overuse injury was suspected and patient was placed in a wrist splint. Patient states he went back to work 2 days ago and has had increasing pain. Unable to work Midwife. No focal weakness or numbness.  Patient has a history of hypertension. States he's been taking his medication as previously prescribed. No headache, visual changes, chest pain. Past Medical History  Diagnosis Date  . Hypertension   . Wears glasses    Past Surgical History  Procedure Laterality Date  . Cholecystectomy     Family History  Problem Relation Age of Onset  . Cancer Mother    History  Substance Use Topics  . Smoking status: Current Every Day Smoker -- 1.00 packs/day    Types: Cigars  . Smokeless tobacco: Never Used  . Alcohol Use: No     Comment: occ    Review of Systems  Constitutional: Negative for fever and chills.  Eyes: Negative for visual disturbance.  Respiratory: Negative for cough and shortness of breath.   Cardiovascular: Negative for chest pain.  Gastrointestinal: Negative for nausea, vomiting and abdominal pain.  Musculoskeletal: Positive for arthralgias.  Skin: Negative for rash and wound.  Neurological: Negative for dizziness, weakness, light-headedness, numbness and headaches.  All other systems reviewed and are negative.     Allergies  Penicillins  Home Medications   Prior to Admission medications   Medication Sig Start Date End Date Taking? Authorizing  Provider  amLODipine (NORVASC) 10 MG tablet Take 1 tablet (10 mg total) by mouth daily. 07/19/14   Virgel Manifold, MD  Aspirin-Salicylamide-Caffeine (BC HEADACHE POWDER PO) Take 1 Package by mouth 2 (two) times daily as needed (pain).    Historical Provider, MD  bismuth subsalicylate (PEPTO BISMOL) 262 MG/15ML suspension Take 30 mLs by mouth every 6 (six) hours as needed for diarrhea or loose stools.    Historical Provider, MD  clindamycin (CLEOCIN) 150 MG capsule Take 2 capsules (300 mg total) by mouth 3 (three) times daily. Patient not taking: Reported on 03/22/2014 02/14/14   Charlann Lange, PA-C  guaiFENesin (ROBITUSSIN) 100 MG/5ML liquid Take 5-10 mLs (100-200 mg total) by mouth every 4 (four) hours as needed for cough. Patient not taking: Reported on 05/28/2014 05/04/14   Waynetta Pean, PA-C  hydrochlorothiazide (HYDRODIURIL) 25 MG tablet Take 1 tablet (25 mg total) by mouth every morning. 06/22/14   Evelina Bucy, MD  HYDROcodone-acetaminophen (NORCO/VICODIN) 5-325 MG per tablet Take 1-2 tablets by mouth every 4 (four) hours as needed. Patient not taking: Reported on 05/04/2014 02/14/14   Charlann Lange, PA-C  ibuprofen (ADVIL,MOTRIN) 200 MG tablet Take 400 mg by mouth every 6 (six) hours as needed for moderate pain (pain).     Historical Provider, MD  lisinopril (PRINIVIL,ZESTRIL) 40 MG tablet Take 1 tablet (40 mg total) by mouth daily. 06/22/14   Evelina Bucy, MD  naproxen (NAPROSYN) 500 MG tablet Take 1 tablet (500 mg total) by mouth 2 (two) times daily. 07/19/14   Hyman Bible, PA-C  oxyCODONE-acetaminophen (  PERCOCET/ROXICET) 5-325 MG per tablet Take 1-2 tablets by mouth every 4 (four) hours as needed for severe pain. Patient not taking: Reported on 05/04/2014 03/22/14   Britt Bottom, NP  promethazine (PHENERGAN) 25 MG tablet Take 1 tablet (25 mg total) by mouth every 6 (six) hours as needed for nausea or vomiting. Patient not taking: Reported on 06/22/2014 05/28/14   Kaitlyn Szekalski, PA-C   BP  162/107 mmHg  Pulse 60  Temp(Src) 97.6 F (36.4 C) (Oral)  Resp 16  Ht 6\' 2"  (1.88 m)  Wt 220 lb (99.791 kg)  BMI 28.23 kg/m2  SpO2 98% Physical Exam  Constitutional: He is oriented to person, place, and time. He appears well-developed and well-nourished. No distress.  HENT:  Head: Normocephalic and atraumatic.  Mouth/Throat: Oropharynx is clear and moist.  Eyes: EOM are normal. Pupils are equal, round, and reactive to light.  Neck: Normal range of motion. Neck supple.  Cardiovascular: Normal rate and regular rhythm.   Pulmonary/Chest: Effort normal and breath sounds normal.  Abdominal: Soft. Bowel sounds are normal.  Musculoskeletal: Normal range of motion. He exhibits tenderness. He exhibits no edema.  Patient with tenderness to palpation over the left ulnar styloid region. Normal range of motion of the wrist. No deformity or swelling. No redness or warmth. No snuffbox tenderness. Distal pulses intact.  Neurological: He is alert and oriented to person, place, and time.  Good grip strength bilaterally. Sensation fully intact.  Skin: Skin is warm and dry. No rash noted. No erythema.  Psychiatric: He has a normal mood and affect. His behavior is normal.  Nursing note and vitals reviewed.   ED Course  Procedures (including critical care time) Labs Review Labs Reviewed - No data to display  Imaging Review Dg Wrist Complete Left  07/29/2014   CLINICAL DATA:  Wrist pain for 2 weeks, possible job related injury.  EXAM: LEFT WRIST - COMPLETE 3+ VIEW  COMPARISON:  None.  FINDINGS: No acute fracture deformity or dislocation. Mild widening of the radial ulnar joint space. Joint space intact without erosions. No destructive bony lesions. Soft tissue planes are not suspicious.  IMPRESSION: Slight widening of the radial ulnar joint space suggest remote injury without acute fracture deformity or dislocation.   Electronically Signed   By: Elon Alas   On: 07/29/2014 04:26     EKG  Interpretation None      MDM   Final diagnoses:  Left wrist pain  Elevated blood pressure    Advise continued use of the wrist splint. Patient has been given hand follow-up and encouraged to make an appointment. Return precautions given.  Patient also advised to follow-up with health and wellness regarding blood pressure management. Improve blood pressure after by mouth meds in the emergency department.  Julianne Rice, MD 07/30/14 [phone]

## 2014-07-29 NOTE — ED Notes (Signed)
Patient transported to X-ray 

## 2014-07-29 NOTE — ED Notes (Signed)
Pt complains of left wrist pain for two weeks, he was seen here on the 9th and given a brace, no xrays

## 2014-07-29 NOTE — ED Notes (Signed)
Pt states he does a lot of heavy lifting at his job, states he doesn't remember any injury, states "I think it might just be wearing out".

## 2014-07-29 NOTE — Discharge Instructions (Signed)
Follow-up with the hand surgeon. Continue to wear splint. You may take over-the-counter ibuprofen and Tylenol for pain. Follow-up in health and wellness clinic for blood pressure management. Wrist Pain Wrist injuries are frequent in adults and children. A sprain is an injury to the ligaments that hold your bones together. A strain is an injury to muscle or muscle cord-like structures (tendons) from stretching or pulling. Generally, when wrists are moderately tender to touch following a fall or injury, a break in the bone (fracture) may be present. Most wrist sprains or strains are better in 3 to 5 days, but complete healing may take several weeks. HOME CARE INSTRUCTIONS   Put ice on the injured area.  Put ice in a plastic bag.  Place a towel between your skin and the bag.  Leave the ice on for 15-20 minutes, 3-4 times a day, for the first 2 days, or as directed by your health care provider.  Keep your arm raised above the level of your heart whenever possible to reduce swelling and pain.  Rest the injured area for at least 48 hours or as directed by your health care provider.  If a splint or elastic bandage has been applied, use it for as long as directed by your health care provider or until seen by a health care provider for a follow-up exam.  Only take over-the-counter or prescription medicines for pain, discomfort, or fever as directed by your health care provider.  Keep all follow-up appointments. You may need to follow up with a specialist or have follow-up X-rays. Improvement in pain level is not a guarantee that you did not fracture a bone in your wrist. The only way to determine whether or not you have a broken bone is by X-ray. SEEK IMMEDIATE MEDICAL CARE IF:   Your fingers are swollen, very red, white, or cold and blue.  Your fingers are numb or tingling.  You have increasing pain.  You have difficulty moving your fingers. MAKE SURE YOU:   Understand these  instructions.  Will watch your condition.  Will get help right away if you are not doing well or get worse. Document Released: 12/06/2004 Document Revised: 03/03/2013 Document Reviewed: 04/19/2010 [facility] Patient Information 2015 [place], [place]. This information is not intended to replace advice given to you by your health care provider. Make sure you discuss any questions you have with your health care provider.  Hypertension Hypertension, commonly called high blood pressure, is when the force of blood pumping through your arteries is too strong. Your arteries are the blood vessels that carry blood from your heart throughout your body. A blood pressure reading consists of a higher number over a lower number, such as 110/72. The higher number (systolic) is the pressure inside your arteries when your heart pumps. The lower number (diastolic) is the pressure inside your arteries when your heart relaxes. Ideally you want your blood pressure below 120/80. Hypertension forces your heart to work harder to pump blood. Your arteries may become narrow or stiff. Having hypertension puts you at risk for heart disease, stroke, and other problems.  RISK FACTORS Some risk factors for high blood pressure are controllable. Others are not.  Risk factors you cannot control include:   Race. You may be at higher risk if you are African American.  Age. Risk increases with age.  Gender. Men are at higher risk than women before age 1 years. After age 71, women are at higher risk than men. Risk factors you can control  include:  Not getting enough exercise or physical activity.  Being overweight.  Getting too much fat, sugar, calories, or salt in your diet.  Drinking too much alcohol. SIGNS AND SYMPTOMS Hypertension does not usually cause signs or symptoms. Extremely high blood pressure (hypertensive crisis) may cause headache, anxiety, shortness of breath, and nosebleed. DIAGNOSIS  To check if you have  hypertension, your health care provider will measure your blood pressure while you are seated, with your arm held at the level of your heart. It should be measured at least twice using the same arm. Certain conditions can cause a difference in blood pressure between your right and left arms. A blood pressure reading that is higher than normal on one occasion does not mean that you need treatment. If one blood pressure reading is high, ask your health care provider about having it checked again. TREATMENT  Treating high blood pressure includes making lifestyle changes and possibly taking medicine. Living a healthy lifestyle can help lower high blood pressure. You may need to change some of your habits. Lifestyle changes may include:  Following the DASH diet. This diet is high in fruits, vegetables, and whole grains. It is low in salt, red meat, and added sugars.  Getting at least 2 hours of brisk physical activity every week.  Losing weight if necessary.  Not smoking.  Limiting alcoholic beverages.  Learning ways to reduce stress. If lifestyle changes are not enough to get your blood pressure under control, your health care provider may prescribe medicine. You may need to take more than one. Work closely with your health care provider to understand the risks and benefits. HOME CARE INSTRUCTIONS  Have your blood pressure rechecked as directed by your health care provider.   Take medicines only as directed by your health care provider. Follow the directions carefully. Blood pressure medicines must be taken as prescribed. The medicine does not work as well when you skip doses. Skipping doses also puts you at risk for problems.   Do not smoke.   Monitor your blood pressure at home as directed by your health care provider. SEEK MEDICAL CARE IF:   You think you are having a reaction to medicines taken.  You have recurrent headaches or feel dizzy.  You have swelling in your  ankles.  You have trouble with your vision. SEEK IMMEDIATE MEDICAL CARE IF:  You develop a severe headache or confusion.  You have unusual weakness, numbness, or feel faint.  You have severe chest or abdominal pain.  You vomit repeatedly.  You have trouble breathing. MAKE SURE YOU:   Understand these instructions.  Will watch your condition.  Will get help right away if you are not doing well or get worse. Document Released: 02/26/2005 Document Revised: 07/13/2013 Document Reviewed: 12/19/2012 [facility] Patient Information 2015 [place], [place]. This information is not intended to replace advice given to you by your health care provider. Make sure you discuss any questions you have with your health care provider.

## 2014-12-08 ENCOUNTER — Encounter (HOSPITAL_COMMUNITY): Payer: Self-pay | Admitting: Emergency Medicine

## 2014-12-08 ENCOUNTER — Emergency Department (HOSPITAL_COMMUNITY)
Admission: EM | Admit: 2014-12-08 | Discharge: 2014-12-08 | Disposition: A | Discharge status: Home / Self Care | Payer: Self-pay | Attending: Emergency Medicine | Admitting: Emergency Medicine

## 2014-12-08 DIAGNOSIS — Z79899 Other long term (current) drug therapy: Secondary | ICD-10-CM | POA: Insufficient documentation

## 2014-12-08 DIAGNOSIS — I10 Essential (primary) hypertension: Secondary | ICD-10-CM | POA: Insufficient documentation

## 2014-12-08 DIAGNOSIS — Z973 Presence of spectacles and contact lenses: Secondary | ICD-10-CM | POA: Insufficient documentation

## 2014-12-08 DIAGNOSIS — Z880 Allergy status to penicillin: Secondary | ICD-10-CM | POA: Insufficient documentation

## 2014-12-08 DIAGNOSIS — R51 Headache: Secondary | ICD-10-CM | POA: Insufficient documentation

## 2014-12-08 DIAGNOSIS — Z720 Tobacco use: Secondary | ICD-10-CM | POA: Insufficient documentation

## 2014-12-08 MED ORDER — LISINOPRIL 40 MG PO TABS
40.0000 mg | ORAL_TABLET | Freq: Once | ORAL | Status: AC
  Administered 2014-12-08: 40 mg via ORAL
  Filled 2014-12-08: qty 1

## 2014-12-08 MED ORDER — LISINOPRIL 40 MG PO TABS: 40.0000 mg | ORAL_TABLET | Freq: Every day | ORAL | Status: DC

## 2014-12-08 MED ORDER — HYDROCHLOROTHIAZIDE 25 MG PO TABS: 25.0000 mg | ORAL_TABLET | Freq: Every day | ORAL | Status: DC

## 2014-12-08 MED ORDER — CLONIDINE HCL 0.1 MG PO TABS
0.1000 mg | ORAL_TABLET | Freq: Once | ORAL | Status: AC
  Administered 2014-12-08: 0.1 mg via ORAL
  Filled 2014-12-08: qty 1

## 2014-12-08 MED ORDER — ACETAMINOPHEN 325 MG PO TABS
650.0000 mg | ORAL_TABLET | Freq: Once | ORAL | Status: AC
  Administered 2014-12-08: 650 mg via ORAL
  Filled 2014-12-08: qty 2

## 2014-12-08 MED ORDER — HYDROCHLOROTHIAZIDE 25 MG PO TABS
25.0000 mg | ORAL_TABLET | Freq: Every day | ORAL | Status: DC
  Administered 2014-12-08: 25 mg via ORAL
  Filled 2014-12-08 (×2): qty 1

## 2014-12-08 NOTE — ED Provider Notes (Signed)
CSN: [id number]     Arrival date & time 12/08/14  0018 History   First MD Initiated Contact with Patient 12/08/14 0028     Chief Complaint  Patient presents with  . Headache  . Hypertension     (Consider location/radiation/quality/duration/timing/severity/associated sxs/prior Treatment) Patient is a 39 y.o. male presenting with headaches and hypertension. The history is provided by the patient. No language interpreter was used.  Headache Pain location:  Generalized Quality:  Dull Radiates to:  Does not radiate Associated symptoms: no fever   Associated symptoms comment:  Presents with headache and generalized "not feeling well" since last night. He reports he has felt similar to this when his blood pressure is elevated and he ran out of his medications yesterday. He took both HTN medications the day before yesterday. No vomiting, fever, visual changes, syncope, chest pain, SOB, LE edema.  Hypertension Associated symptoms include headaches. Pertinent negatives include no chills or fever.    Past Medical History  Diagnosis Date  . Hypertension   . Wears glasses    Past Surgical History  Procedure Laterality Date  . Cholecystectomy     Family History  Problem Relation Age of Onset  . Cancer Mother    Social History  Substance Use Topics  . Smoking status: Current Every Day Smoker -- 1.00 packs/day    Types: Cigars  . Smokeless tobacco: Never Used  . Alcohol Use: No     Comment: occ    Review of Systems  Constitutional: Negative for fever and chills.  Eyes: Negative for visual disturbance.  Respiratory: Negative.   Cardiovascular: Negative.  Negative for leg swelling.  Gastrointestinal: Negative.   Musculoskeletal: Negative.   Skin: Negative.   Neurological: Positive for headaches.      Allergies  Penicillins  Home Medications   Prior to Admission medications   Medication Sig Start Date End Date Taking? Authorizing Provider  Aspirin-Salicylamide-Caffeine  (BC HEADACHE POWDER PO) Take 1 Package by mouth 2 (two) times daily as needed (pain).   Yes Historical Provider, MD  hydrochlorothiazide (HYDRODIURIL) 25 MG tablet Take 1 tablet (25 mg total) by mouth every morning. 06/22/14  Yes Evelina Bucy, MD  ibuprofen (ADVIL,MOTRIN) 200 MG tablet Take 400 mg by mouth every 6 (six) hours as needed for moderate pain (pain).    Yes Historical Provider, MD  lisinopril (PRINIVIL,ZESTRIL) 40 MG tablet Take 1 tablet (40 mg total) by mouth daily. 06/22/14  Yes Evelina Bucy, MD  amLODipine (NORVASC) 10 MG tablet Take 1 tablet (10 mg total) by mouth daily. Patient not taking: Reported on 12/08/2014 07/19/14   Virgel Manifold, MD  clindamycin (CLEOCIN) 150 MG capsule Take 2 capsules (300 mg total) by mouth 3 (three) times daily. Patient not taking: Reported on 03/22/2014 02/14/14   Charlann Lange, PA-C  guaiFENesin (ROBITUSSIN) 100 MG/5ML liquid Take 5-10 mLs (100-200 mg total) by mouth every 4 (four) hours as needed for cough. Patient not taking: Reported on 05/28/2014 05/04/14   Waynetta Pean, PA-C  HYDROcodone-acetaminophen (NORCO/VICODIN) 5-325 MG per tablet Take 1-2 tablets by mouth every 4 (four) hours as needed. Patient not taking: Reported on 05/04/2014 02/14/14   Charlann Lange, PA-C  naproxen (NAPROSYN) 500 MG tablet Take 1 tablet (500 mg total) by mouth 2 (two) times daily. Patient not taking: Reported on 12/08/2014 07/19/14   Hyman Bible, PA-C  oxyCODONE-acetaminophen (PERCOCET/ROXICET) 5-325 MG per tablet Take 1-2 tablets by mouth every 4 (four) hours as needed for severe pain. Patient not taking: Reported on 05/04/2014  03/22/14   Britt Bottom, NP  promethazine (PHENERGAN) 25 MG tablet Take 1 tablet (25 mg total) by mouth every 6 (six) hours as needed for nausea or vomiting. Patient not taking: Reported on 06/22/2014 05/28/14   Kaitlyn Szekalski, PA-C   BP 200/135 mmHg  Pulse 76  Temp(Src) 98 F (36.7 C) (Oral)  Resp 18  Ht 6\' 2"  (1.88 m)  Wt 220 lb (99.791 kg)   BMI 28.23 kg/m2  SpO2 99% Physical Exam  Constitutional: He is oriented to person, place, and time. He appears well-developed and well-nourished.  HENT:  Head: Normocephalic.  Neck: Normal range of motion. Neck supple.  Cardiovascular: Normal rate and regular rhythm.   Pulmonary/Chest: Effort normal and breath sounds normal. He has no wheezes. He has no rales.  Abdominal: Soft. Bowel sounds are normal. There is no tenderness. There is no rebound and no guarding.  Musculoskeletal: Normal range of motion.  Neurological: He is alert and oriented to person, place, and time. Coordination normal.  CN's 3-12 grossly intact. Speech clear and focused. No deficits of coordination. Ambulatory without imbalance.   Skin: Skin is warm and dry. No rash noted.  Psychiatric: He has a normal mood and affect.    ED Course  Procedures (including critical care time) Labs Review Labs Reviewed - No data to display  Imaging Review No results found. I have personally reviewed and evaluated these images and lab results as part of my medical decision-making.   EKG Interpretation None      MDM   Final diagnoses:  None    1. Hypertension  Blood pressure reduced in the emergency department with medications. Will refill Rx's for Lisinopril and HCTZ, but also strongly encouraged the patient to seek PCP in the outpatient setting. No chest pain, normal neuro exam. Feel he is appropriate for discharge.     Charlann Lange, PA-C 12/08/14 [id number]  Julianne Rice, MD 12/08/14 [phone]

## 2014-12-08 NOTE — ED Notes (Signed)
Reviewed past ER visits pt's pressure is always considerably high

## 2014-12-08 NOTE — ED Notes (Addendum)
Pt states he ran out of his BP meds last night and started having a headache tonight after a nap. Neuro intact. Alert and oriented.

## 2014-12-08 NOTE — Discharge Instructions (Signed)

## 2015-01-10 ENCOUNTER — Encounter (HOSPITAL_COMMUNITY): Payer: Self-pay | Admitting: *Deleted

## 2015-01-10 ENCOUNTER — Emergency Department (HOSPITAL_COMMUNITY)
Admission: EM | Admit: 2015-01-10 | Discharge: 2015-01-10 | Disposition: A | Discharge status: Home / Self Care | Payer: Self-pay | Attending: Emergency Medicine | Admitting: Emergency Medicine

## 2015-01-10 DIAGNOSIS — Z973 Presence of spectacles and contact lenses: Secondary | ICD-10-CM | POA: Insufficient documentation

## 2015-01-10 DIAGNOSIS — Z7982 Long term (current) use of aspirin: Secondary | ICD-10-CM | POA: Insufficient documentation

## 2015-01-10 DIAGNOSIS — M545 Low back pain, unspecified: Secondary | ICD-10-CM

## 2015-01-10 DIAGNOSIS — I10 Essential (primary) hypertension: Secondary | ICD-10-CM | POA: Insufficient documentation

## 2015-01-10 DIAGNOSIS — Z79899 Other long term (current) drug therapy: Secondary | ICD-10-CM | POA: Insufficient documentation

## 2015-01-10 DIAGNOSIS — Z880 Allergy status to penicillin: Secondary | ICD-10-CM | POA: Insufficient documentation

## 2015-01-10 DIAGNOSIS — Z720 Tobacco use: Secondary | ICD-10-CM | POA: Insufficient documentation

## 2015-01-10 MED ORDER — NAPROXEN 500 MG PO TABS: 500.0000 mg | ORAL_TABLET | Freq: Two times a day (BID) | ORAL | Status: DC

## 2015-01-10 MED ORDER — OXYCODONE-ACETAMINOPHEN 5-325 MG PO TABS: 2.0000 | ORAL_TABLET | ORAL | Status: DC | PRN

## 2015-01-10 MED ORDER — KETOROLAC TROMETHAMINE 60 MG/2ML IM SOLN
60.0000 mg | Freq: Once | INTRAMUSCULAR | Status: AC
  Administered 2015-01-10: 60 mg via INTRAMUSCULAR
  Filled 2015-01-10: qty 2

## 2015-01-10 MED ORDER — CYCLOBENZAPRINE HCL 10 MG PO TABS: 10.0000 mg | ORAL_TABLET | Freq: Two times a day (BID) | ORAL | Status: DC | PRN

## 2015-01-10 NOTE — ED Notes (Signed)
Pt reports lower back pain since Thursday. Works for [employer] and has to do a lot of lifting. Pain 9/10. Denies dysuria. Denies incontinence. Hx of HTN, has taken BP today.

## 2015-01-10 NOTE — ED Provider Notes (Signed)
CSN: [id number]     Arrival date & time 01/10/15  1427 History  By signing my name below, I, [name], attest that this documentation has been prepared under the direction and in the presence of Ottie Glazier, PA-C Electronically Signed: Soijett [name], ED Scribe. 01/10/2015. 3:44 PM.   Chief Complaint  Patient presents with  . Back Pain      The history is provided by the patient. No language interpreter was used.    HPI Comments: [name] is a 39 y.o. male with a medical hx of HTN who presents to the Emergency Department complaining of 9/10 left low back pain onset 5 days worsening yesterday. He notes that he works for [employer] and he does a lot of heavy lifting and his back pain worsened later into his shift. He reports that the back pain does not radiate anywhere. He states that he has tried ibuprofen with no relief for his symptoms. Pt denies bowel/bladder incontinence and any other associated symptoms. Denies medical hx of CA, recent prednisone use, or IV drug use.     Past Medical History  Diagnosis Date  . Hypertension   . Wears glasses    Past Surgical History  Procedure Laterality Date  . Cholecystectomy     Family History  Problem Relation Age of Onset  . Cancer Mother    Social History  Substance Use Topics  . Smoking status: Current Every Day Smoker -- 1.00 packs/day    Types: Cigars  . Smokeless tobacco: Never Used  . Alcohol Use: No     Comment: occ    Review of Systems  Gastrointestinal:       No bowel incontinence  Genitourinary:       No bladder incontinence  Musculoskeletal: Positive for back pain. Negative for gait problem.  Skin: Negative for color change, rash and wound.      Allergies  Penicillins  Home Medications   Prior to Admission medications   Medication Sig Start Date End Date Taking? Authorizing Provider  amLODipine (NORVASC) 10 MG tablet Take 1 tablet (10 mg total) by mouth daily. Patient not taking: Reported on  12/08/2014 07/19/14   Virgel Manifold, MD  Aspirin-Salicylamide-Caffeine [facility] HEADACHE POWDER PO) Take 1 Package by mouth 2 (two) times daily as needed (pain).    Historical Provider, MD  clindamycin (CLEOCIN) 150 MG capsule Take 2 capsules (300 mg total) by mouth 3 (three) times daily. Patient not taking: Reported on 03/22/2014 02/14/14   Charlann Lange, PA-C  cyclobenzaprine (FLEXERIL) 10 MG tablet Take 1 tablet (10 mg total) by mouth 2 (two) times daily as needed for muscle spasms. 01/10/15   Axell Trigueros Patel-Mills, PA-C  guaiFENesin (ROBITUSSIN) 100 MG/5ML liquid Take 5-10 mLs (100-200 mg total) by mouth every 4 (four) hours as needed for cough. Patient not taking: Reported on 05/28/2014 05/04/14   Waynetta Pean, PA-C  hydrochlorothiazide (HYDRODIURIL) 25 MG tablet Take 1 tablet (25 mg total) by mouth daily. 12/08/14   Charlann Lange, PA-C  HYDROcodone-acetaminophen (NORCO/VICODIN) 5-325 MG per tablet Take 1-2 tablets by mouth every 4 (four) hours as needed. Patient not taking: Reported on 05/04/2014 02/14/14   Charlann Lange, PA-C  ibuprofen (ADVIL,MOTRIN) 200 MG tablet Take 400 mg by mouth every 6 (six) hours as needed for moderate pain (pain).     Historical Provider, MD  lisinopril (PRINIVIL,ZESTRIL) 40 MG tablet Take 1 tablet (40 mg total) by mouth daily. 12/08/14   Charlann Lange, PA-C  naproxen (NAPROSYN) 500 MG tablet Take  1 tablet (500 mg total) by mouth 2 (two) times daily. 01/10/15   Aleshka Corney Patel-Mills, PA-C  oxyCODONE-acetaminophen (PERCOCET/ROXICET) 5-325 MG tablet Take 2 tablets by mouth every 4 (four) hours as needed for severe pain. 01/10/15   Itzamar Traynor Patel-Mills, PA-C  promethazine (PHENERGAN) 25 MG tablet Take 1 tablet (25 mg total) by mouth every 6 (six) hours as needed for nausea or vomiting. Patient not taking: Reported on 06/22/2014 05/28/14   Kaitlyn Szekalski, PA-C   BP 166/108 mmHg  Pulse 68  Temp(Src) 98.3 F (36.8 C) (Oral)  Resp 18  SpO2 100% Physical Exam  Constitutional: He is oriented  to person, place, and time. He appears well-developed and well-nourished. No distress.  HENT:  Head: Normocephalic and atraumatic.  Eyes: EOM are normal.  Neck: Neck supple.  Cardiovascular: Normal rate.   Pulmonary/Chest: Effort normal. No respiratory distress.  Abdominal: Soft. There is no tenderness.  Musculoskeletal: Normal range of motion.  No midline lumbar vertebral tenderness. Left sided reproducible muscle spasms. No saddle anesthesia. Negative SLR.  Neurological: He is alert and oriented to person, place, and time.  Skin: Skin is warm and dry.  Psychiatric: He has a normal mood and affect. His behavior is normal.  Nursing note and vitals reviewed.   ED Course  Procedures (including critical care time) DIAGNOSTIC STUDIES: Oxygen Saturation is 100% on RA, nl by my interpretation.    COORDINATION OF CARE: 3:40 PM Discussed treatment plan with pt at bedside which includes toradol injection, muscle relaxer Rx, and anti-inflammatory Rx and pt agreed to plan.    Labs Review Labs Reviewed - No data to display  Imaging Review No results found.    EKG Interpretation None      MDM   Final diagnoses:  Left-sided low back pain without sciatica   Patient presents for low back pain without red flags. She was given Flexeril, naproxen, and Percocet for pain. I discussed return precautions. I also discussed follow-up and she verbally agrees with the plan.  Medications  ketorolac (TORADOL) injection 60 mg (60 mg Intramuscular Given 01/10/15 1602)   I personally performed the services described in this documentation, which was scribed in my presence. The recorded information has been reviewed and is accurate.    Ottie Glazier, PA-C 01/10/15 1907  Milton Ferguson, MD 01/10/15 2342

## 2015-01-10 NOTE — Discharge Instructions (Signed)
Back Exercises Follow-up with a primary care provider using the resource guide below. Return for any bowel or bladder incontinence or retention, or lower extremity weakness or numbness. Take Flexeril and naproxen for back pain. Take Percocet for breakthrough pain. Do not drive or operate heavy machinery while using pain medication or muscle relaxants. The following exercises strengthen the muscles that help to support the back. They also help to keep the lower back flexible. Doing these exercises can help to prevent back pain or lessen existing pain. If you have back pain or discomfort, try doing these exercises 2-3 times each day or as told by your health care provider. When the pain goes away, do them once each day, but increase the number of times that you repeat the steps for each exercise (do more repetitions). If you do not have back pain or discomfort, do these exercises once each day or as told by your health care provider. EXERCISES Single Knee to Chest Repeat these steps 3-5 times for each leg: 1. Lie on your back on a firm bed or the floor with your legs extended. 2. Bring one knee to your chest. Your other leg should stay extended and in contact with the floor. 3. Hold your knee in place by grabbing your knee or thigh. 4. Pull on your knee until you feel a gentle stretch in your lower back. 5. Hold the stretch for 10-30 seconds. 6. Slowly release and straighten your leg. Pelvic Tilt Repeat these steps 5-10 times: 1. Lie on your back on a firm bed or the floor with your legs extended. 2. Bend your knees so they are pointing toward the ceiling and your feet are flat on the floor. 3. Tighten your lower abdominal muscles to press your lower back against the floor. This motion will tilt your pelvis so your tailbone points up toward the ceiling instead of pointing to your feet or the floor. 4. With gentle tension and even breathing, hold this position for 5-10 seconds. Cat-Cow Repeat these  steps until your lower back becomes more flexible: 1. Get into a hands-and-knees position on a firm surface. Keep your hands under your shoulders, and keep your knees under your hips. You may place padding under your knees for comfort. 2. Let your head hang down, and point your tailbone toward the floor so your lower back becomes rounded like the back of a cat. 3. Hold this position for 5 seconds. 4. Slowly lift your head and point your tailbone up toward the ceiling so your back forms a sagging arch like the back of a cow. 5. Hold this position for 5 seconds. Press-Ups Repeat these steps 5-10 times: 1. Lie on your abdomen (face-down) on the floor. 2. Place your palms near your head, about shoulder-width apart. 3. While you keep your back as relaxed as possible and keep your hips on the floor, slowly straighten your arms to raise the top half of your body and lift your shoulders. Do not use your back muscles to raise your upper torso. You may adjust the placement of your hands to make yourself more comfortable. 4. Hold this position for 5 seconds while you keep your back relaxed. 5. Slowly return to lying flat on the floor. Bridges Repeat these steps 10 times: 1. Lie on your back on a firm surface. 2. Bend your knees so they are pointing toward the ceiling and your feet are flat on the floor. 3. Tighten your buttocks muscles and lift your buttocks off of  the floor until your waist is at almost the same height as your knees. You should feel the muscles working in your buttocks and the back of your thighs. If you do not feel these muscles, slide your feet 1-2 inches farther away from your buttocks. 4. Hold this position for 3-5 seconds. 5. Slowly lower your hips to the starting position, and allow your buttocks muscles to relax completely. If this exercise is too easy, try doing it with your arms crossed over your chest. Abdominal Crunches Repeat these steps 5-10 times: 1. Lie on your back on a  firm bed or the floor with your legs extended. 2. Bend your knees so they are pointing toward the ceiling and your feet are flat on the floor. 3. Cross your arms over your chest. 4. Tip your chin slightly toward your chest without bending your neck. 5. Tighten your abdominal muscles and slowly raise your trunk (torso) high enough to lift your shoulder blades a tiny bit off of the floor. Avoid raising your torso higher than that, because it can put too much stress on your low back and it does not help to strengthen your abdominal muscles. 6. Slowly return to your starting position. Back Lifts Repeat these steps 5-10 times: 1. Lie on your abdomen (face-down) with your arms at your sides, and rest your forehead on the floor. 2. Tighten the muscles in your legs and your buttocks. 3. Slowly lift your chest off of the floor while you keep your hips pressed to the floor. Keep the back of your head in line with the curve in your back. Your eyes should be looking at the floor. 4. Hold this position for 3-5 seconds. 5. Slowly return to your starting position. SEEK MEDICAL CARE IF:  Your back pain or discomfort gets much worse when you do an exercise.  Your back pain or discomfort does not lessen within 2 hours after you exercise. If you have any of these problems, stop doing these exercises right away. Do not do them again unless your health care provider says that you can. SEEK IMMEDIATE MEDICAL CARE IF:  You develop sudden, severe back pain. If this happens, stop doing the exercises right away. Do not do them again unless your health care provider says that you can.   This information is not intended to replace advice given to you by your health care provider. Make sure you discuss any questions you have with your health care provider.   Document Released: 04/05/2004 Document Revised: 11/17/2014 Document Reviewed: 04/22/2014 Elsevier Interactive Patient Education 2016 [employer].  Emergency  Department Resource Guide 1) Find a Doctor and Pay Out of Pocket Although you won't have to find out who is covered by your insurance plan, it is a good idea to ask around and get recommendations. You will then need to call the office and see if the doctor you have chosen will accept you as a new patient and what types of options they offer for patients who are self-pay. Some doctors offer discounts or will set up payment plans for their patients who do not have insurance, but you will need to ask so you aren't surprised when you get to your appointment.  2) Contact Your Local Health Department Not all health departments have doctors that can see patients for sick visits, but many do, so it is worth a call to see if yours does. If you don't know where your local health department is, you can check in your  phone book. The CDC also has a tool to help you locate your state's health department, and many state websites also have listings of all of their local health departments.  3) Find a [facility] If your illness is not likely to be very severe or complicated, you may want to try a walk in clinic. These are popping up all over the country in pharmacies, drugstores, and shopping centers. They're usually staffed by nurse practitioners or physician assistants that have been trained to treat common illnesses and complaints. They're usually fairly quick and inexpensive. However, if you have serious medical issues or chronic medical problems, these are probably not your best option.  No Primary Care Doctor: - Call Health Connect at  [phone] - they can help you locate a primary care doctor that  accepts your insurance, provides certain services, etc. - Physician Referral Service- [phone]  Chronic Pain Problems: Organization         Address  Phone   Notes  [facility]  [phone] Patients need to be referred by their primary care doctor.   Medication  Assistance: Organization         Address  Phone   Notes  [facility] Medication [facility] [place], [place] [address] [phone] --Must be a resident of [facility] -- Must have NO insurance coverage whatsoever (no Medicaid/ Medicare, etc.) -- The pt. MUST have a primary care doctor that directs their care regularly and follows them in the community   [facility]ist  [phone]   [employer]  [phone]    Agencies that provide inexpensive medical care: Organization         Address  Phone   Notes  [place]  [phone]   [facility]    [phone]   [facility] [place], [place] [address] [phone]   [place] [address], [place] [phone]   Planned Parenthood    [phone]   [facility]    [phone]   [place] and [place] [address], [place] Phone:  [phone], Fax:  [phone] Hours of Operation:  9 am - 6 pm, M-F.  Also accepts Medicaid/Medicare and self-pay.  [facility] for I[address], [address], [place] Phone: [phone], Fax: [phone]. Hours of Operation:  8:30 am - 5:30 pm, M-F.  Also accepts Medicaid and self-pay.  [facility] [place] [address] [place] Phone: [phone]   [place], [place], [place] [phone], Ext. [phone] Mondays & Thursdays: 7-9 AM.  First 15 patients are seen on a first come, first serve basis.    [place] Providers:  Organization         Address  Phone   Notes  [facility] [address], [place] [phone] Also accepts self-pay patients.  [facility] [address], [place]  [phone]   [place], [address], [place]  [phone]   [facility] [address] [place] [phone]   Lucianne Lei [address], [place]   [phone] Only accepts [place] Access [place] patients after they have their name applied to their card.  Self-Pay (no insurance) in [facility]:  Organization         Address  Phone   Notes  Sickle Cell Patients, [facility] [place] [phone]   [facility] [place] [phone]   [facility] [place]  [place], [place], [place] [phone]   [facility]/Dr. Osei-Bonsu  [address] [place] or [address], [place] [phone] Phone number for both [place] and [place] locations is the same.  [facility] [address], [place] [phone]   [facility] [address], [place] or [address] [phone]   [facility] [address], [place] [phone], phone; [phone], fax Sees patients 1st and 3rd Saturday of every month.  Must not qualify for public or private insurance (i.e. Medicaid, Medicare, [place] Health Choice, Veterans' Benefits)  Household income should be no more than 200% of the poverty level The clinic cannot treat you if you are pregnant or think you are pregnant  Sexually transmitted diseases are not treated at the clinic.    Dental Care: Organization         Address  Phone  Notes  [facility] Department of [facility] [phone] Accepts children up to age 52 who are enrolled in [place] or [place]; pregnant women with a Medicaid card; and children who have applied for Medicaid or [place] Health Choice, but were declined, whose parents can pay a reduced fee at time of service.  [facility]  Department of [facility]  [address], [place] [phone] Accepts children up to age 15 who are enrolled in [place] or [place]; pregnant women with a Medicaid card; and children who have applied for Medicaid or [place] Health Choice, but were declined, whose parents can pay a reduced fee at time of service.  [place] Adult Dental Access PROGRAM  [place] [phone] Patients are seen by appointment only. Walk-ins are not accepted. [place] will see patients 53 years of age and older. Monday - Tuesday (8am-5pm) Most Wednesdays (8:30-5pm) $30 per visit, cash only  [facility] Adult Dental Access PROGRAM  [address], [facility] [phone] Patients are seen by appointment only. Walk-ins are not accepted. [place] will see patients 51 years of age and older. One Wednesday Evening (Monthly: Volunteer Based).  $30 per visit, cash only  [place]  [phone] for adults; Children under age 46, call Graduate Pediatric Dentistry at [phone]. Children aged 59-14, please call [phone] to request a pediatric application.  Dental services are provided in all areas of dental care including fillings, crowns and bridges, complete and partial dentures, implants, gum treatment, root canals, and extractions. Preventive care is also provided. Treatment is provided to both adults and children. Patients are selected via a lottery and there is often a waiting list.   [facility] [address] [place]  [phone] [email or web address]   [facility] [address] [place], [place] [phone], Ext. [phone] Second and Fourth Thursday of each month, opens at 6:30 AM; Clinic ends at 9 AM.  Patients are seen on a first-come first-served basis, and a limited number are  seen during each clinic.   [facility]  [address] Hillard Danker [place], [place] [phone]    Eligibility Requirements You must have lived in [place], [place], or [place] counties for at least the last three months.   You cannot be eligible for state or federal sponsored [employer], including [employer], [place], or [employer].   You generally cannot be eligible for healthcare insurance through your employer.    How to apply: Eligibility screenings are held every Tuesday and Wednesday afternoon from 1:00 pm until 4:00 pm. You do not need an appointment for the interview!  [facility] [address], [place], [place]   [place]  [place] Department  [place]  [phone]    Behavioral Health Resources in the Community: Intensive Outpatient Programs Organization         Address  Phone  Notes  Ione Zearing. [address] [place], [place] [phone]   [facility] Outpatient [address], [place], [place]   ADS: Alcohol & Drug Svcs [address], [place], [place]   [place] [address]  [place], [place] or [phone]   Substance Abuse Resources Organization         Address  Phone  Notes  Alcohol and Drug Services  [phone]   [place]  [phone]   The [place]   Chinita Pester  [phone]   Residential & Outpatient Substance Abuse Program  [phone]   Psychological Services Organization         Address  Phone  Notes  [facility] [place]  [place]  [phone]   [place] [address] [place] or [phone]    Mobile Crisis Teams Organization         Address  Phone  Notes  [facility], Mobile Crisis Care Unit  [phone]   [facility]ive Psychotherapeutic Services  [address]  [place], [place]   [place] [address], [place] [phone]    Self-Help/Support Groups Organization         Address  Phone             Notes  [place]. of [place] - variety of support groups  [place] Call for more information  Narcotics Anonymous (NA), [facility] [address], [employer] [place]  2 meetings at this location   Special educational needs teacher         Address  Phone  Notes  ASAP Residential Treatment [place],    [place]  [phone]   [facility]  [address], [place] [address], [place], [place]   [place], [place] [phone] Admissions: 8am-3pm M-F  Incentives Substance [place] [address],    [place], [place] [phone]   [facility] [address] Jadene Pierini [place], [place]   [facility] [address],  [place], [place]   [facility] - Intensive Outpatient [address] Kristeen [address], [place], [place]   [facility] ([place].,  [place], [place] or [phone]   [facility] Services (RTS) [address], [place], [place] Accepts Medicaid  [facility] [place]  [place] [phone] Substance Abuse/Addiction Treatment   [facility] Organization         Address  Phone  Notes  [facility]  [phone]   Domenic Schwab, PhD [address] Arlis Porta [place], [place]   [phone] or [phone]   [place], [place] [phone]   [address], [place], [place] [phone] Insurance/Medicaid/sponsorship through [facility] [address]                                    [place], [place] [phone] [place] [address], [place] [phone]    Dr. Adele Schilder  [phone]   [facility]. 1) [address] [place] 2) [place] 3)  [place] [address], [place] [phone]  [phone]   [place] [phone] or [phone] (After Hours)

## 2015-01-10 NOTE — ED Notes (Signed)
Patient was alert, oriented and stable upon discharge. RN went over AVS and patient had no further questions. Pt advised to not drive or operate heavy machinery on narcotics.

## 2015-02-22 ENCOUNTER — Encounter (HOSPITAL_COMMUNITY): Payer: Self-pay | Admitting: Emergency Medicine

## 2015-02-22 ENCOUNTER — Emergency Department (HOSPITAL_COMMUNITY)
Admission: EM | Admit: 2015-02-22 | Discharge: 2015-02-22 | Disposition: A | Discharge status: Home / Self Care | Payer: Self-pay | Attending: Emergency Medicine | Admitting: Emergency Medicine

## 2015-02-22 DIAGNOSIS — J40 Bronchitis, not specified as acute or chronic: Secondary | ICD-10-CM | POA: Insufficient documentation

## 2015-02-22 DIAGNOSIS — F17210 Nicotine dependence, cigarettes, uncomplicated: Secondary | ICD-10-CM | POA: Insufficient documentation

## 2015-02-22 DIAGNOSIS — I10 Essential (primary) hypertension: Secondary | ICD-10-CM | POA: Insufficient documentation

## 2015-02-22 DIAGNOSIS — Z880 Allergy status to penicillin: Secondary | ICD-10-CM | POA: Insufficient documentation

## 2015-02-22 DIAGNOSIS — Z79899 Other long term (current) drug therapy: Secondary | ICD-10-CM | POA: Insufficient documentation

## 2015-02-22 DIAGNOSIS — Z973 Presence of spectacles and contact lenses: Secondary | ICD-10-CM | POA: Insufficient documentation

## 2015-02-22 MED ORDER — ALBUTEROL SULFATE HFA 108 (90 BASE) MCG/ACT IN AERS
2.0000 | INHALATION_SPRAY | RESPIRATORY_TRACT | Status: DC | PRN
  Administered 2015-02-22: 2 via RESPIRATORY_TRACT
  Filled 2015-02-22: qty 6.7

## 2015-02-22 MED ORDER — HYDROCHLOROTHIAZIDE 25 MG PO TABS: 25.0000 mg | ORAL_TABLET | Freq: Every day | ORAL | Status: DC

## 2015-02-22 MED ORDER — AMLODIPINE BESYLATE 10 MG PO TABS: 10.0000 mg | ORAL_TABLET | Freq: Every day | ORAL | Status: DC

## 2015-02-22 MED ORDER — AMLODIPINE BESYLATE 10 MG PO TABS
10.0000 mg | ORAL_TABLET | Freq: Once | ORAL | Status: AC
  Administered 2015-02-22: 10 mg via ORAL
  Filled 2015-02-22: qty 1

## 2015-02-22 MED ORDER — HYDROCOD POLST-CPM POLST ER 10-8 MG/5ML PO SUER: 5.0000 mL | Freq: Two times a day (BID) | ORAL | Status: DC | PRN

## 2015-02-22 MED ORDER — LISINOPRIL 40 MG PO TABS: 40.0000 mg | ORAL_TABLET | Freq: Every day | ORAL | Status: DC

## 2015-02-22 NOTE — ED Provider Notes (Signed)
CSN: [id number]     Arrival date & time 02/22/15  0401 History   First MD Initiated Contact with Patient 02/22/15 [phone]     Chief Complaint  Patient presents with  . Cough     (Consider location/radiation/quality/duration/timing/severity/associated sxs/prior Treatment) HPI  This is a 39 year old male with a history of hypertension. He states he caught "the flu" just to her Thanksgiving. This illness consisted of fever, chills, body aches, cough, nasal congestion, scratchy throat and malaise. He did not have nausea, vomiting or diarrhea. His cough was productive of green sputum.  His symptoms have resolved except for a persistent cough and occasional nasal congestion. The cough has been productive of clear sputum. It has not been adequately relieved with Mucinex and Coricidin. He has not had a fever recently. He has had some equivocal dyspnea on exertion.  He states he has been compliant with the hydrochlorothiazide and lisinopril he was prescribed on a prior ED visit but he has not had his morning doses today. He states [facility] but has not been able to get an appointment.  Past Medical History  Diagnosis Date  . Hypertension   . Wears glasses    Past Surgical History  Procedure Laterality Date  . Cholecystectomy     Family History  Problem Relation Age of Onset  . Cancer Mother    Social History  Substance Use Topics  . Smoking status: Current Some Day Smoker -- 1.00 packs/day    Types: Cigars  . Smokeless tobacco: Never Used  . Alcohol Use: Yes     Comment: occ    Review of Systems  All other systems reviewed and are negative.   Allergies  Penicillins  Home Medications   Prior to Admission medications   Medication Sig Start Date End Date Taking? Authorizing Provider  Aspirin-Salicylamide-Caffeine (BC HEADACHE POWDER PO) Take 1 Package by mouth 2 (two) times daily as needed (pain).   Yes Historical Provider, MD   Dextromethorphan-Guaifenesin (CORICIDIN HBP CONGESTION/COUGH) 10-200 MG CAPS Take 1 capsule by mouth daily.   Yes Historical Provider, MD  dextromethorphan-guaiFENesin (MUCINEX DM) 30-600 MG 12hr tablet Take 1 tablet by mouth 2 (two) times daily.   Yes Historical Provider, MD  hydrochlorothiazide (HYDRODIURIL) 25 MG tablet Take 1 tablet (25 mg total) by mouth daily. 12/08/14  Yes Shari Upstill, PA-C  ibuprofen (ADVIL,MOTRIN) 200 MG tablet Take 400 mg by mouth every 6 (six) hours as needed for moderate pain (pain).    Yes Historical Provider, MD  lisinopril (PRINIVIL,ZESTRIL) 40 MG tablet Take 1 tablet (40 mg total) by mouth daily. 12/08/14  Yes Shari Upstill, PA-C  amLODipine (NORVASC) 10 MG tablet Take 1 tablet (10 mg total) by mouth daily. Patient not taking: Reported on 12/08/2014 07/19/14   Virgel Manifold, MD  clindamycin (CLEOCIN) 150 MG capsule Take 2 capsules (300 mg total) by mouth 3 (three) times daily. Patient not taking: Reported on 03/22/2014 02/14/14   Charlann Lange, PA-C  cyclobenzaprine (FLEXERIL) 10 MG tablet Take 1 tablet (10 mg total) by mouth 2 (two) times daily as needed for muscle spasms. Patient not taking: Reported on 02/22/2015 01/10/15   Ottie Glazier, PA-C  guaiFENesin (ROBITUSSIN) 100 MG/5ML liquid Take 5-10 mLs (100-200 mg total) by mouth every 4 (four) hours as needed for cough. Patient not taking: Reported on 05/28/2014 05/04/14   Waynetta Pean, PA-C  HYDROcodone-acetaminophen (NORCO/VICODIN) 5-325 MG per tablet Take 1-2 tablets by mouth every 4 (four) hours  as needed. Patient not taking: Reported on 05/04/2014 02/14/14   Charlann Lange, PA-C  naproxen (NAPROSYN) 500 MG tablet Take 1 tablet (500 mg total) by mouth 2 (two) times daily. Patient not taking: Reported on 02/22/2015 01/10/15   Ottie Glazier, PA-C  oxyCODONE-acetaminophen (PERCOCET/ROXICET) 5-325 MG tablet Take 2 tablets by mouth every 4 (four) hours as needed for severe pain. Patient not taking: Reported on  02/22/2015 01/10/15   Ottie Glazier, PA-C  promethazine (PHENERGAN) 25 MG tablet Take 1 tablet (25 mg total) by mouth every 6 (six) hours as needed for nausea or vomiting. Patient not taking: Reported on 06/22/2014 05/28/14   Alvina Chou, PA-C   BP 202/127 mmHg  Pulse 71  Temp(Src) 97.9 F (36.6 C) (Oral)  Resp 18  Ht 6' 2.5" (1.892 m)  Wt 220 lb (99.791 kg)  BMI 27.88 kg/m2  SpO2 100%   Physical Exam  General: Well-developed, well-nourished male in no acute distress; appearance consistent with age of record HENT: normocephalic; atraumatic; palatal petechiae; partial nasal congestion Eyes: pupils equal, round and reactive to light; extraocular muscles intact; conjunctival injection, right greater than left Neck: supple Heart: regular rate and rhythm Lungs: clear to auscultation bilaterally; frequent cough Abdomen: soft; nondistended; nontender; bowel sounds present Extremities: No deformity; full range of motion; pulses normal Neurologic: Awake, alert and oriented; motor function intact in all extremities and symmetric; no facial droop Skin: Warm and dry Psychiatric: Normal mood and affect    ED Course  Procedures (including critical care time)   MDM  We will refill his antihypertensives and provide some follow-up resources. He was advised of the importance of getting his blood pressure under control. Will treat for his lingering bronchitis symptoms.     Shanon Rosser, MD 02/22/15 [phone]

## 2015-02-22 NOTE — ED Notes (Signed)
Pt states he had the flu over thanksgiving and the following Sunday he started having a cough Pt states the cough is persistant and productive  Pt states he is coughing up clear sputum

## 2015-02-23 ENCOUNTER — Encounter (HOSPITAL_COMMUNITY): Payer: Self-pay | Admitting: Emergency Medicine

## 2015-02-23 ENCOUNTER — Emergency Department (HOSPITAL_COMMUNITY)
Admission: EM | Admit: 2015-02-23 | Discharge: 2015-02-23 | Disposition: A | Discharge status: Home / Self Care | Payer: Self-pay | Attending: Emergency Medicine | Admitting: Emergency Medicine

## 2015-02-23 ENCOUNTER — Emergency Department (HOSPITAL_COMMUNITY): Payer: Self-pay

## 2015-02-23 DIAGNOSIS — B9789 Other viral agents as the cause of diseases classified elsewhere: Secondary | ICD-10-CM

## 2015-02-23 DIAGNOSIS — Z973 Presence of spectacles and contact lenses: Secondary | ICD-10-CM | POA: Insufficient documentation

## 2015-02-23 DIAGNOSIS — J40 Bronchitis, not specified as acute or chronic: Secondary | ICD-10-CM | POA: Insufficient documentation

## 2015-02-23 DIAGNOSIS — I10 Essential (primary) hypertension: Secondary | ICD-10-CM | POA: Insufficient documentation

## 2015-02-23 DIAGNOSIS — Z880 Allergy status to penicillin: Secondary | ICD-10-CM | POA: Insufficient documentation

## 2015-02-23 DIAGNOSIS — J069 Acute upper respiratory infection, unspecified: Secondary | ICD-10-CM | POA: Insufficient documentation

## 2015-02-23 DIAGNOSIS — F17210 Nicotine dependence, cigarettes, uncomplicated: Secondary | ICD-10-CM | POA: Insufficient documentation

## 2015-02-23 DIAGNOSIS — Z79899 Other long term (current) drug therapy: Secondary | ICD-10-CM | POA: Insufficient documentation

## 2015-02-23 MED ORDER — BENZONATATE 100 MG PO CAPS: 200.0000 mg | ORAL_CAPSULE | Freq: Two times a day (BID) | ORAL | Status: DC | PRN

## 2015-02-23 MED ORDER — BENZONATATE 100 MG PO CAPS
200.0000 mg | ORAL_CAPSULE | Freq: Once | ORAL | Status: AC
Start: 2015-02-23 — End: 2015-02-23
  Administered 2015-02-23: 200 mg via ORAL
  Filled 2015-02-23: qty 2

## 2015-02-23 MED ORDER — OXYMETAZOLINE HCL 0.05 % NA SOLN: 1.0000 | Freq: Two times a day (BID) | NASAL | Status: DC

## 2015-02-23 NOTE — Discharge Instructions (Signed)
Take your medications as prescribed for symptomatic relief. He may continue using your albuterol inhaler as needed for shortness of breath/wheezing. Please follow up with a primary care provider from the Resource Guide provided below in 4-5 days. Please return to the Emergency Department if symptoms worsen or new onset of fever, sore throat, difficulty breathing, wheezing, chest pain.    Emergency Department Resource Guide 1) Find a Doctor and Pay Out of Pocket Although you won't have to find out who is covered by your insurance plan, it is a good idea to ask around and get recommendations. You will then need to call the office and see if the doctor you have chosen will accept you as a new patient and what types of options they offer for patients who are self-pay. Some doctors offer discounts or will set up payment plans for their patients who do not have insurance, but you will need to ask so you aren't surprised when you get to your appointment.  2) Contact Your Local Health Department Not all health departments have doctors that can see patients for sick visits, but many do, so it is worth a call to see if yours does. If you don't know where your local health department is, you can check in your phone book. The CDC also has a tool to help you locate your state's health department, and many state websites also have listings of all of their local health departments.  3) Find a [facility] If your illness is not likely to be very severe or complicated, you may want to try a walk in clinic. These are popping up all over the country in pharmacies, drugstores, and shopping centers. They're usually staffed by nurse practitioners or physician assistants that have been trained to treat common illnesses and complaints. They're usually fairly quick and inexpensive. However, if you have serious medical issues or chronic medical problems, these are probably not your best option.  No Primary Care  Doctor: - Call Health Connect at  [phone] - they can help you locate a primary care doctor that  accepts your insurance, provides certain services, etc. - Physician Referral Service- [phone]  Chronic Pain Problems: Organization         Address  Phone   Notes  [facility]  [phone] Patients need to be referred by their primary care doctor.   Medication Assistance: Organization         Address  Phone   Notes  [facility] Medication [facility] [address] [place], [place] [address] [phone] --Must be a resident of [facility] -- Must have NO insurance coverage whatsoever (no Medicaid/ Medicare, etc.) -- The pt. MUST have a primary care doctor that directs their care regularly and follows them in the community   MedAssist  [phone]   [employer]  [phone]    Agencies that provide inexpensive medical care: Organization         Address  Phone   Notes  [place]  [phone]   [facility]    [phone]   [facility] [place] [place], [place] [address] [phone]   [place] [address], [place] [phone]   Planned Parenthood    [phone]   [facility]    [phone]   [place] and [place] [address] [place], [place] Phone:  [phone])  [id number], Fax:  [phone] Hours of Operation:  9 am - 6 pm, M-F.  Also accepts Medicaid/Medicare and self-pay.  [facility] for [place] [address], [address], [place] Phone: [phone], Fax: [phone]. Hours of Operation:  8:30 am - 5:30 pm, M-F.  Also accepts Medicaid and self-pay.  [facility] [place] [address], [place] Phone: [phone]   [place], [place], [place] [phone], Ext. [phone] Mondays & Thursdays: 7-9 AM.  First 15 patients are seen on a first  come, first serve basis.    [place] Providers:  Organization         Address  Phone   Notes  [facility] [place] [address], [place] [phone] Also accepts self-pay patients.  [facility] [id number] [place], [place]  [phone]   [place], [address], [place] [phone]   [facility] [address], [place] [phone]   Lucianne Lei [address], [place]   [phone] Only accepts [place] Access [place] patients after they have their name applied to their card.   Self-Pay (no insurance) in [facility]:  Organization         Address  Phone   Notes  Sickle Cell Patients, [facility] [place] [phone]   [facility] [place] [phone]   [facility] [place]  [place], [place], [place] [phone]   [facility]/Dr. Osei-Bonsu  [address] [place] or [address], [place] [phone] Phone number for both [place] and [place] locations is the same.  [facility] [address], [place] [phone]   [place] [address], [place] or [address] [phone]   [facility] [address], [place] [phone], phone; [phone], fax Sees patients 1st and 3rd Saturday of every month.  Must not qualify for public or private insurance (i.e. Medicaid, Medicare, [place] Health Choice, Veterans' Benefits)  Household income should be no more than 200% of the poverty level The clinic cannot treat you if you are pregnant or think you are pregnant  Sexually transmitted diseases are not treated at the clinic.    Dental Care: Organization          Address  Phone  Notes  [facility] Department of [facility] [phone] Accepts children up to age 20 who are enrolled in [place] or [place]; pregnant women with a Medicaid card; and children who have applied for Medicaid or [place] Health Choice, but were declined, whose parents can pay a reduced fee at time of service.  [facility] Department of [facility]  [address], [place] [phone] Accepts children up to age 65 who are enrolled in [place] or [place]; pregnant women with a Medicaid card; and children who have applied for Medicaid or [place] Health Choice, but were declined, whose parents can pay a reduced fee at time of service.  [place] Adult [facility] Access PROGRAM  [place] [phone] Patients are seen by appointment only. Walk-ins are  not accepted. [place] will see patients 46 years of age and older. Monday - Tuesday (8am-5pm) Most Wednesdays (8:30-5pm) $30 per visit, cash only  [facility] Adult Dental Access PROGRAM  [address], [facility] [phone] Patients are seen by appointment only. Walk-ins are not accepted. [place] will see patients 60 years of age and older. One Wednesday Evening (Monthly: Volunteer Based).  $30 per visit, cash only  [place]  [phone] for adults; Children under age 9, call Graduate Pediatric Dentistry at [phone]. Children aged 38-14, please call [phone] to request a pediatric application.  Dental services are provided in all areas of dental care including fillings, crowns and bridges, complete and partial dentures, implants, gum treatment, root canals, and extractions. Preventive care is also provided. Treatment is provided to both adults and children. Patients are selected via a lottery and there is often a waiting list.   [facility] [address] [place]  [phone] [email or web address]   [facility] [address] [place], [place] [phone], Ext. [phone] Second and Fourth Thursday of each month, opens at 6:30 AM; Clinic ends at 9 AM.  Patients are seen on a first-come first-served basis, and a limited number are seen during each clinic.   [facility]  [address] [place]ard Danker [place], [place] [phone]   Eligibility Requirements You must have lived in [place], [place], or [place] counties for at least the last three months.   You cannot be eligible for state or federal sponsored [employer], including [employer], [place], or [employer].   You generally cannot be eligible for healthcare insurance through your employer.    How to apply: Eligibility screenings are held every Tuesday and Wednesday afternoon from 1:00 pm until 4:00 pm. You do not need an appointment for the interview!  [facility] [address], [place], [place]   [place] Department  [place]  [phone]    Behavioral Health Resources in the Community: Intensive Outpatient Programs Organization         Address  Phone  Notes  [place]. [address] [place], [place] [phone]   [facility] Outpatient [address], [place], [place]   ADS: Alcohol & Drug Svcs [address], [place], [place]   [place] [address],  [place], [place] or [phone]   Substance Abuse Resources Organization         Address  Phone  Notes  Alcohol and Drug Services  [phone]   [place]  [phone]   [facility] [place]   Chinita Pester  [phone]   Residential & Outpatient Substance Abuse Program  [phone]   Psychological  Services Organization         Address  Phone  Notes  [facility] [place]  [place]  [phone]   [place] [address], [place] [phone] or [phone]    Mobile Crisis Teams Organization         Address  Phone  Notes  [facility], Mobile Crisis Care Unit  [phone]   Assertive Psychotherapeutic Services  [address] [place], [place]   [facility] [address] [place] [phone]    Self-Help/Support  Groups Organization         Address  Phone             Notes  [facility] of [place] - variety of support groups  [place] Call for more information  Narcotics Anonymous (NA), [facility] [address], [employer] [place]  2 meetings at this location   Special educational needs teacher         Address  Phone  Notes  [facility]idential [facility] [place],    [place]  [phone]   [facility]  [address] [place] [id number], [place], [place]   [place], [place] [phone] Admissions: 8am-3pm M-F  Incentives Substance [place] [address]    [place], [place] [phone]   [facility] [address] [place], [place], [place]   [facility] [address]  [place], [place]   [facility] Programs - Intensive Outpatient [address], [place], [place]   [facility] ([place].,  [place], [place] [phone] or [phone]   [facility] (RTS) [address], [place], [place] Accepts Medicaid  [facility] [place] [address],  [place] [phone] Substance Abuse/Addiction Treatment   [facility] Organization         Address  Phone  Notes  [facility]  [phone]   Domenic Schwab, PhD [address] [place], [place]   [phone] or [phone]   [facility] [place], [place] [phone]   [facility] [address] [place], [place] [phone] Insurance/Medicaid/sponsorship through [facility] and Families [address] [place]                                    [place], [place] [phone] [place] [address], [place] [phone]    Dr. Adele Schilder  [phone]   [facility] Dept. 1) [address] [place] 2) [place] 3)  [place] [address], [place] [phone]  [phone]   [place] [phone] or [phone] (After Hours)

## 2015-02-23 NOTE — ED Notes (Signed)
Pt was here yesterday, diagnosed with bronchitis and given meds. Here today because while trying to cough up phlegm he coughed up blood streaked sputum. No fever or vomiting

## 2015-02-23 NOTE — ED Provider Notes (Signed)
CSN: [id number]     Arrival date & time 02/23/15  1350 History  By signing my name below, I, Julien Nordmann, attest that this documentation has been prepared under the direction and in the presence of Harlene Ramus, PA-C. Electronically Signed: Julien Nordmann, ED Scribe. 02/23/2015. 2:43 PM.    Chief Complaint  Patient presents with  . Cough      The history is provided by the patient. No language interpreter was used.   HPI Comments: [name] is a 39 y.o. male who presents to the Emergency Department complaining of acute onset, small, productive cough with streaking bloody sputum x1 onset this afternoon. He notes have an associated sinus pressure and nasal congestion. Pt notes he has had a productive cough for the past two weeks. He was seen last night and was diagnosed with bronchitis. He used his albuterol treatment this morning to help with the cough with minimal relief. Pt denies fever, vomiting, sore throat, difficulty breathing, wheezing, chest pain, nausea, diarrhea, blood in stool, numbness, tingling, ear pain, and congestion.  Past Medical History  Diagnosis Date  . Hypertension   . Wears glasses    Past Surgical History  Procedure Laterality Date  . Cholecystectomy     Family History  Problem Relation Age of Onset  . Cancer Mother    Social History  Substance Use Topics  . Smoking status: Current Some Day Smoker -- 1.00 packs/day    Types: Cigars  . Smokeless tobacco: Never Used  . Alcohol Use: Yes     Comment: occ    Review of Systems  Constitutional: Negative for fever.  HENT: Positive for congestion and sinus pressure. Negative for ear pain and sore throat.   Respiratory: Positive for cough. Negative for shortness of breath and wheezing.   Cardiovascular: Negative for chest pain.  Gastrointestinal: Negative for nausea, vomiting and diarrhea.  Neurological: Negative for dizziness, light-headedness and numbness.  All other systems reviewed and are  negative.     Allergies  Penicillins  Home Medications   Prior to Admission medications   Medication Sig Start Date End Date Taking? Authorizing Provider  amLODipine (NORVASC) 10 MG tablet Take 1 tablet (10 mg total) by mouth daily. 02/22/15   John Molpus, MD  Aspirin-Salicylamide-Caffeine (BC HEADACHE POWDER PO) Take 1 Package by mouth 2 (two) times daily as needed (pain).    Historical Provider, MD  benzonatate (TESSALON) 100 MG capsule Take 2 capsules (200 mg total) by mouth 2 (two) times daily as needed for cough. 02/23/15   Nona Dell, PA-C  chlorpheniramine-HYDROcodone [facility] ER) 10-8 MG/5ML SUER Take 5 mLs by mouth every 12 (twelve) hours as needed for cough. 02/22/15   John Molpus, MD  hydrochlorothiazide (HYDRODIURIL) 25 MG tablet Take 1 tablet (25 mg total) by mouth daily. 02/22/15   John Molpus, MD  lisinopril (PRINIVIL,ZESTRIL) 40 MG tablet Take 1 tablet (40 mg total) by mouth daily. 02/22/15   John Molpus, MD  oxymetazoline (AFRIN NASAL SPRAY) 0.05 % nasal spray Place 1 spray into both nostrils 2 (two) times daily. 02/23/15   Nona Dell, PA-C   Triage vitals: BP 164/123 mmHg  Pulse 84  Temp(Src) 98 F (36.7 C) (Oral)  Resp 18  SpO2 98% Physical Exam  Constitutional: He is oriented to person, place, and time. He appears well-developed and well-nourished. No distress.  HENT:  Head: Normocephalic and atraumatic.  Right Ear: Tympanic membrane normal.  Left Ear: Tympanic membrane normal.  Nose: Nose normal.  Mouth/Throat: Oropharynx is clear and moist. No oropharyngeal exudate.  No sinus tenderness  Eyes: Conjunctivae and EOM are normal. Right eye exhibits no discharge. Left eye exhibits no discharge. No scleral icterus.  Neck: Normal range of motion. Neck supple.  Cardiovascular: Normal rate, regular rhythm, normal heart sounds and intact distal pulses.   Pulmonary/Chest: Effort normal and breath sounds normal. No respiratory  distress. He has no wheezes. He has no rales. He exhibits no tenderness.  Abdominal: Soft. Bowel sounds are normal. He exhibits no distension. There is no tenderness.  Musculoskeletal: He exhibits no edema.  Lymphadenopathy:    He has no cervical adenopathy.  Neurological: He is alert and oriented to person, place, and time. Coordination normal.  Skin: Skin is warm and dry. No rash noted. He is not diaphoretic.  Psychiatric: He has a normal mood and affect. His behavior is normal.  Nursing note and vitals reviewed.   ED Course  Procedures  DIAGNOSTIC STUDIES: Oxygen Saturation is 98% on RA, normal by my interpretation.  COORDINATION OF CARE:  2:32 PM Discussed treatment plan which includes chest x-ray with pt at bedside and pt agreed to plan.  Labs Review Labs Reviewed - No data to display  Imaging Review Dg Chest 2 View  02/23/2015  CLINICAL DATA:  Productive cough and hemoptysis. Associated headache. EXAM: CHEST - 2 VIEW COMPARISON:  Two-view chest x-ray 05/04/2014. FINDINGS: Heart size normal. Lungs are clear. There is no edema or effusion to suggest failure. No focal airspace disease is present. The visualized soft tissues and bony thorax are unremarkable. IMPRESSION: Negative two view chest x-ray Electronically Signed   By: San Morelle M.D.   On: 02/23/2015 15:06   I have personally reviewed and evaluated these images and lab results as part of my medical decision-making.  Filed Vitals:   02/23/15 1402  BP: 164/123  Pulse: 84  Temp: 98 F (36.7 C)  Resp: 18     MDM   Final diagnoses:  Viral URI with cough    Patient presents with productive cough, reports one episode of blood-streaked sputum this morning. Endorses associated nasal And sinus pressure. Patient was seen in the ED yesterday, diagnosed with bronchitis and discharged home with symptomatic treatment. VSS.  exam unremarkable, lungs CTAB. Due to patient's new onset of hemoptysis will order chest x-ray  for further evaluation. Chest x-ray negative. I suspect patient's episode of hemoptysis is likely benign and due to his history of a chronic cough for the past few weeks associated with viral URI. I do not suspect cancer, pneumonia, TB, PE, or CHF and do not feel that further workup or imaging is warranted at this time. Plan to d/c pt home with decongestant and antitussive.   Evaluation does not show pathology requring ongoing emergent intervention or admission. Pt is hemodynamically stable and mentating appropriately. Discussed findings/results and plan with patient/guardian, who agrees with plan. All questions answered. Return precautions discussed and outpatient follow up given.    I personally performed the services described in this documentation, which was scribed in my presence. The recorded information has been reviewed and is accurate.   [place] Noon [place], [place] 02/23/15 [place], MD 02/26/15 [phone]

## 2015-05-07 ENCOUNTER — Encounter (HOSPITAL_COMMUNITY): Payer: Self-pay | Admitting: Oncology

## 2015-05-07 ENCOUNTER — Emergency Department (HOSPITAL_COMMUNITY): Payer: Self-pay

## 2015-05-07 ENCOUNTER — Emergency Department (HOSPITAL_COMMUNITY)
Admission: EM | Admit: 2015-05-07 | Discharge: 2015-05-07 | Disposition: A | Discharge status: Home / Self Care | Payer: Self-pay | Attending: Emergency Medicine | Admitting: Emergency Medicine

## 2015-05-07 DIAGNOSIS — Z973 Presence of spectacles and contact lenses: Secondary | ICD-10-CM | POA: Insufficient documentation

## 2015-05-07 DIAGNOSIS — Z880 Allergy status to penicillin: Secondary | ICD-10-CM | POA: Insufficient documentation

## 2015-05-07 DIAGNOSIS — R0789 Other chest pain: Secondary | ICD-10-CM | POA: Insufficient documentation

## 2015-05-07 DIAGNOSIS — I10 Essential (primary) hypertension: Secondary | ICD-10-CM | POA: Insufficient documentation

## 2015-05-07 DIAGNOSIS — Z79899 Other long term (current) drug therapy: Secondary | ICD-10-CM | POA: Insufficient documentation

## 2015-05-07 DIAGNOSIS — F17210 Nicotine dependence, cigarettes, uncomplicated: Secondary | ICD-10-CM | POA: Insufficient documentation

## 2015-05-07 MED ORDER — LISINOPRIL 40 MG PO TABS: 40.0000 mg | ORAL_TABLET | Freq: Every day | ORAL | Status: DC

## 2015-05-07 MED ORDER — NAPROXEN 500 MG PO TABS: 500.0000 mg | ORAL_TABLET | Freq: Two times a day (BID) | ORAL | Status: DC | PRN

## 2015-05-07 MED ORDER — AMLODIPINE BESYLATE 10 MG PO TABS
10.0000 mg | ORAL_TABLET | Freq: Once | ORAL | Status: AC
  Administered 2015-05-07: 10 mg via ORAL
  Filled 2015-05-07: qty 1

## 2015-05-07 MED ORDER — LISINOPRIL 20 MG PO TABS
40.0000 mg | ORAL_TABLET | Freq: Once | ORAL | Status: AC
  Administered 2015-05-07: 40 mg via ORAL
  Filled 2015-05-07: qty 2

## 2015-05-07 MED ORDER — HYDROCHLOROTHIAZIDE 25 MG PO TABS: 25.0000 mg | ORAL_TABLET | Freq: Every day | ORAL | Status: DC

## 2015-05-07 MED ORDER — AMLODIPINE BESYLATE 10 MG PO TABS: 10.0000 mg | ORAL_TABLET | Freq: Every day | ORAL | Status: DC

## 2015-05-07 MED ORDER — TRAMADOL HCL 50 MG PO TABS: 50.0000 mg | ORAL_TABLET | Freq: Four times a day (QID) | ORAL | Status: DC | PRN

## 2015-05-07 NOTE — ED Notes (Signed)
Pt states he played basket ball last weekend and has had chest wall pain since that time.  Pt was to go to work this evening however he did not feel he would be able to lift heavy objects d/t the pain.  Pt is A&O x 4.  NAD.

## 2015-05-07 NOTE — Discharge Instructions (Signed)
Chest Wall Pain Chest wall pain is pain in or around the bones and muscles of your chest. Sometimes, an injury causes this pain. Sometimes, the cause may not be known. This pain may take several weeks or longer to get better. HOME CARE INSTRUCTIONS  Pay attention to any changes in your symptoms. Take these actions to help with your pain:   Rest as told by your health care provider.   Avoid activities that cause pain. These include any activities that use your chest muscles or your abdominal and side muscles to lift heavy items.   If directed, apply ice to the painful area:  Put ice in a plastic bag.  Place a towel between your skin and the bag.  Leave the ice on for 20 minutes, 2-3 times per day.  Take over-the-counter and prescription medicines only as told by your health care provider.  Do not use tobacco products, including cigarettes, chewing tobacco, and e-cigarettes. If you need help quitting, ask your health care provider.  Keep all follow-up visits as told by your health care provider. This is important. SEEK MEDICAL CARE IF:  You have a fever.  Your chest pain becomes worse.  You have new symptoms. SEEK IMMEDIATE MEDICAL CARE IF:  You have nausea or vomiting.  You feel sweaty or light-headed.  You have a cough with phlegm (sputum) or you cough up blood.  You develop shortness of breath.   This information is not intended to replace advice given to you by your health care provider. Make sure you discuss any questions you have with your health care provider.   Document Released: 02/26/2005 Document Revised: 11/17/2014 Document Reviewed: 05/24/2014 Elsevier Interactive Patient Education 2016 [employer].  Hypertension Hypertension, commonly called high blood pressure, is when the force of blood pumping through your arteries is too strong. Your arteries are the blood vessels that carry blood from your heart throughout your body. A blood pressure reading consists  of a higher number over a lower number, such as 110/72. The higher number (systolic) is the pressure inside your arteries when your heart pumps. The lower number (diastolic) is the pressure inside your arteries when your heart relaxes. Ideally you want your blood pressure below 120/80. Hypertension forces your heart to work harder to pump blood. Your arteries may become narrow or stiff. Having untreated or uncontrolled hypertension can cause heart attack, stroke, kidney disease, and other problems. RISK FACTORS Some risk factors for high blood pressure are controllable. Others are not.  Risk factors you cannot control include:   Race. You may be at higher risk if you are African American.  Age. Risk increases with age.  Gender. Men are at higher risk than women before age 59 years. After age 45, women are at higher risk than men. Risk factors you can control include:  Not getting enough exercise or physical activity.  Being overweight.  Getting too much fat, sugar, calories, or salt in your diet.  Drinking too much alcohol. SIGNS AND SYMPTOMS Hypertension does not usually cause signs or symptoms. Extremely high blood pressure (hypertensive crisis) may cause headache, anxiety, shortness of breath, and nosebleed. DIAGNOSIS To check if you have hypertension, your health care provider will measure your blood pressure while you are seated, with your arm held at the level of your heart. It should be measured at least twice using the same arm. Certain conditions can cause a difference in blood pressure between your right and left arms. A blood pressure reading that is  higher than normal on one occasion does not mean that you need treatment. If it is not clear whether you have high blood pressure, you may be asked to return on a different day to have your blood pressure checked again. Or, you may be asked to monitor your blood pressure at home for 1 or more weeks. TREATMENT Treating high blood  pressure includes making lifestyle changes and possibly taking medicine. Living a healthy lifestyle can help lower high blood pressure. You may need to change some of your habits. Lifestyle changes may include:  Following the DASH diet. This diet is high in fruits, vegetables, and whole grains. It is low in salt, red meat, and added sugars.  Keep your sodium intake below 2,300 mg per day.  Getting at least 30-45 minutes of aerobic exercise at least 4 times per week.  Losing weight if necessary.  Not smoking.  Limiting alcoholic beverages.  Learning ways to reduce stress. Your health care provider may prescribe medicine if lifestyle changes are not enough to get your blood pressure under control, and if one of the following is true:  You are 57-50 years of age and your systolic blood pressure is above 140.  You are 109 years of age or older, and your systolic blood pressure is above 150.  Your diastolic blood pressure is above 90.  You have diabetes, and your systolic blood pressure is over [id number] or your diastolic blood pressure is over 90.  You have kidney disease and your blood pressure is above 140/90.  You have heart disease and your blood pressure is above 140/90. Your personal target blood pressure may vary depending on your medical conditions, your age, and other factors. HOME CARE INSTRUCTIONS  Have your blood pressure rechecked as directed by your health care provider.   Take medicines only as directed by your health care provider. Follow the directions carefully. Blood pressure medicines must be taken as prescribed. The medicine does not work as well when you skip doses. Skipping doses also puts you at risk for problems.  Do not smoke.   Monitor your blood pressure at home as directed by your health care provider. SEEK MEDICAL CARE IF:   You think you are having a reaction to medicines taken.  You have recurrent headaches or feel dizzy.  You have swelling in your  ankles.  You have trouble with your vision. SEEK IMMEDIATE MEDICAL CARE IF:  You develop a severe headache or confusion.  You have unusual weakness, numbness, or feel faint.  You have severe chest or abdominal pain.  You vomit repeatedly.  You have trouble breathing. MAKE SURE YOU:   Understand these instructions.  Will watch your condition.  Will get help right away if you are not doing well or get worse.   This information is not intended to replace advice given to you by your health care provider. Make sure you discuss any questions you have with your health care provider.   Document Released: 02/26/2005 Document Revised: 07/13/2014 Document Reviewed: 12/19/2012 Elsevier Interactive Patient Education [employer].

## 2015-05-07 NOTE — ED Provider Notes (Signed)
CSN: [id number]     Arrival date & time 05/07/15  0230 History  By signing my name below, I, Terrance Branch, attest that this documentation has been prepared under the direction and in the presence of Orpah Greek, MD. Electronically Signed: Randa Evens, ED Scribe. 05/07/2015. 2:51 AM.    Chief Complaint  Patient presents with  . chest wall pain     The history is provided by the patient. No language interpreter was used.   HPI Comments: [name] is a 40 y.o. male who presents to the Emergency Department complaining of new left sided chest pain onset 5 days prior. Pt states that the pain has recently worsened in the last 2 days. Pt states that he recently played basketball 5 days prior. Pt states that the pain is worse with movement, deep moving, coughing and laying on the left side. Pt doesn't report any medications PTA. Pt reports that he has been hypertensive due to not having his BP medication.   Past Medical History  Diagnosis Date  . Hypertension   . Wears glasses    Past Surgical History  Procedure Laterality Date  . Cholecystectomy     Family History  Problem Relation Age of Onset  . Cancer Mother    Social History  Substance Use Topics  . Smoking status: Current Some Day Smoker -- 1.00 packs/day    Types: Cigars  . Smokeless tobacco: Never Used  . Alcohol Use: Yes     Comment: occ    Review of Systems  Cardiovascular: Positive for chest pain.  All other systems reviewed and are negative.    Allergies  Penicillins  Home Medications   Prior to Admission medications   Medication Sig Start Date End Date Taking? Authorizing Provider  amLODipine (NORVASC) 10 MG tablet Take 1 tablet (10 mg total) by mouth daily. 02/22/15   John Molpus, MD  Aspirin-Salicylamide-Caffeine (BC HEADACHE POWDER PO) Take 1 Package by mouth 2 (two) times daily as needed (pain).    Historical Provider, MD  benzonatate (TESSALON) 100 MG capsule Take 2 capsules (200 mg  total) by mouth 2 (two) times daily as needed for cough. 02/23/15   Nona Dell, PA-C  chlorpheniramine-HYDROcodone [facility]) 10-8 MG/5ML SUER Take 5 mLs by mouth every 12 (twelve) hours as needed for cough. 02/22/15   John Molpus, MD  hydrochlorothiazide (HYDRODIURIL) 25 MG tablet Take 1 tablet (25 mg total) by mouth daily. 02/22/15   John Molpus, MD  lisinopril (PRINIVIL,ZESTRIL) 40 MG tablet Take 1 tablet (40 mg total) by mouth daily. 02/22/15   John Molpus, MD  oxymetazoline (AFRIN NASAL SPRAY) 0.05 % nasal spray Place 1 spray into both nostrils 2 (two) times daily. 02/23/15   Chesley Noon Nadeau, PA-C   BP 180/120 mmHg  Pulse 71  Temp(Src) 98.6 F (37 C) (Oral)  Resp 9  Ht 6\' 2"  (1.88 m)  Wt 220 lb (99.791 kg)  BMI 28.23 kg/m2  SpO2 98%   Physical Exam  Constitutional: He is oriented to person, place, and time. He appears well-developed and well-nourished. No distress.  HENT:  Head: Normocephalic and atraumatic.  Right Ear: Hearing normal.  Left Ear: Hearing normal.  Nose: Nose normal.  Mouth/Throat: Oropharynx is clear and moist and mucous membranes are normal.  Eyes: Conjunctivae and EOM are normal. Pupils are equal, round, and reactive to light.  Neck: Normal range of motion. Neck supple.  Cardiovascular: Regular rhythm, S1 normal and S2 normal.  Exam reveals  no gallop and no friction rub.   No murmur heard. Pulmonary/Chest: Effort normal and breath sounds normal. No respiratory distress. He exhibits no tenderness.  Abdominal: Soft. Normal appearance and bowel sounds are normal. There is no hepatosplenomegaly. There is no tenderness. There is no rebound, no guarding, no tenderness at McBurney's point and negative Murphy's sign. No hernia.  Musculoskeletal: Normal range of motion.  Neurological: He is alert and oriented to person, place, and time. He has normal strength. No cranial nerve deficit or sensory deficit. Coordination normal. GCS eye  subscore is 4. GCS verbal subscore is 5. GCS motor subscore is 6.  Skin: Skin is warm, dry and intact. No rash noted. No cyanosis.  Psychiatric: He has a normal mood and affect. His speech is normal and behavior is normal. Thought content normal.  Nursing note and vitals reviewed.   ED Course  Procedures (including critical care time) DIAGNOSTIC STUDIES: Oxygen Saturation is 98% on RA, normal by my interpretation.    COORDINATION OF CARE: 2:50 AM-Discussed treatment plan with pt at bedside and pt agreed to plan.     Labs Review Labs Reviewed - No data to display  Imaging Review No results found.    EKG Interpretation   Date/Time:  Saturday May 07 2015 02:39:38 EST Ventricular Rate:  71 PR Interval:  206 QRS Duration: 101 QT Interval:  416 QTC Calculation: 452 R Axis:   61 Text Interpretation:  Sinus rhythm Borderline prolonged PR interval  Probable left atrial enlargement Left ventricular hypertrophy Non-specific  ST-t changes No significant change since last tracing Confirmed by POLLINA   MD, CHRISTOPHER [id number]) on 05/07/2015 2:48:01 AM      MDM   Final diagnoses:  None  essential hypertension Chest wall pain  Patient presents to the emergency department for evaluation of left chest pain. Patient reports that he has been having pain since he played basketball last weekend. Patient reports a sharp pain under his left breast area that worsens when he twists to the left, bends or coughs. He has not had any significant cough or shortness of breath, however. Pain is reproducible with movements of the torso here in the ER. EKG is abnormal but unchanged from him. Changes are consistent with LVH. Chest x-ray does not show any lung pathology. Based on the extremely reproducible nature of the pain, I do not suspect cardiac etiology. Patient came today primarily because he could not work because the chest hurts when he tries to lift boxes. He will need a work note.  Patient  is hypertensive. He does report a previous history of hypertension treated with hydrochlorothiazide, lisinopril, Norvasc. He is out of these medications. These were refilled.  I personally performed the services described in this documentation, which was scribed in my presence. The recorded information has been reviewed and is accurate.       Orpah Greek, MD 05/07/15 [phone]

## 2015-05-09 ENCOUNTER — Emergency Department (HOSPITAL_COMMUNITY)
Admission: EM | Admit: 2015-05-09 | Discharge: 2015-05-10 | Disposition: A | Discharge status: Home / Self Care | Payer: Self-pay | Attending: Emergency Medicine | Admitting: Emergency Medicine

## 2015-05-09 ENCOUNTER — Encounter (HOSPITAL_COMMUNITY): Payer: Self-pay | Admitting: Emergency Medicine

## 2015-05-09 DIAGNOSIS — I10 Essential (primary) hypertension: Secondary | ICD-10-CM | POA: Insufficient documentation

## 2015-05-09 DIAGNOSIS — Z880 Allergy status to penicillin: Secondary | ICD-10-CM | POA: Insufficient documentation

## 2015-05-09 DIAGNOSIS — Z79899 Other long term (current) drug therapy: Secondary | ICD-10-CM | POA: Insufficient documentation

## 2015-05-09 DIAGNOSIS — Z973 Presence of spectacles and contact lenses: Secondary | ICD-10-CM | POA: Insufficient documentation

## 2015-05-09 DIAGNOSIS — F17210 Nicotine dependence, cigarettes, uncomplicated: Secondary | ICD-10-CM | POA: Insufficient documentation

## 2015-05-09 NOTE — ED Notes (Signed)
Pt states that he got his HTN RX filled today but his BP is still high. 917/119. Pt c/o HA. Seen recently for same. Alert and oriented. No neuro deficits.

## 2015-05-10 LAB — CBC WITH DIFFERENTIAL/PLATELET
BASOS PCT: 0 %
Basophils Absolute: 0 10*3/uL (ref 0.0–0.1)
Eosinophils Absolute: 0.2 10*3/uL (ref 0.0–0.7)
Eosinophils Relative: 2 %
HEMATOCRIT: 50.8 % (ref 39.0–52.0)
HEMOGLOBIN: 18.2 g/dL — AB (ref 13.0–17.0)
Lymphocytes Relative: 33 %
Lymphs Abs: 3 10*3/uL (ref 0.7–4.0)
MCH: 31.8 pg (ref 26.0–34.0)
MCHC: 35.8 g/dL (ref 30.0–36.0)
MCV: 88.8 fL (ref 78.0–100.0)
MONOS PCT: 11 %
Monocytes Absolute: 1 10*3/uL (ref 0.1–1.0)
NEUTROS ABS: 5 10*3/uL (ref 1.7–7.7)
NEUTROS PCT: 54 %
Platelets: 257 10*3/uL (ref 150–400)
RBC: 5.72 MIL/uL (ref 4.22–5.81)
RDW: 14.4 % (ref 11.5–15.5)
WBC: 9.2 10*3/uL (ref 4.0–10.5)

## 2015-05-10 LAB — I-STAT TROPONIN, ED: TROPONIN I, POC: 0.02 ng/mL (ref 0.00–0.08)

## 2015-05-10 LAB — COMPREHENSIVE METABOLIC PANEL
ALK PHOS: 76 U/L (ref 38–126)
ALT: 18 U/L (ref 17–63)
AST: 20 U/L (ref 15–41)
Albumin: 4.1 g/dL (ref 3.5–5.0)
Anion gap: 8 (ref 5–15)
BILIRUBIN TOTAL: 0.6 mg/dL (ref 0.3–1.2)
BUN: 8 mg/dL (ref 6–20)
CALCIUM: 9.6 mg/dL (ref 8.9–10.3)
CO2: 33 mmol/L — ABNORMAL HIGH (ref 22–32)
CREATININE: 1.17 mg/dL (ref 0.61–1.24)
Chloride: 99 mmol/L — ABNORMAL LOW (ref 101–111)
Glucose, Bld: 97 mg/dL (ref 65–99)
Potassium: 3.6 mmol/L (ref 3.5–5.1)
Sodium: 140 mmol/L (ref 135–145)
TOTAL PROTEIN: 8 g/dL (ref 6.5–8.1)

## 2015-05-10 MED ORDER — KETOROLAC TROMETHAMINE 30 MG/ML IJ SOLN
30.0000 mg | Freq: Once | INTRAMUSCULAR | Status: AC
  Administered 2015-05-10: 30 mg via INTRAVENOUS
  Filled 2015-05-10: qty 1

## 2015-05-10 MED ORDER — CLONIDINE HCL 0.1 MG PO TABS
0.2000 mg | ORAL_TABLET | Freq: Once | ORAL | Status: AC
  Administered 2015-05-10: 0.2 mg via ORAL
  Filled 2015-05-10: qty 2

## 2015-05-10 NOTE — ED Provider Notes (Signed)
CSN: [id number]     Arrival date & time 05/09/15  2328 History   First MD Initiated Contact with Patient 05/10/15 0122     Chief Complaint  Patient presents with  . Hypertension  . Headache     (Consider location/radiation/quality/duration/timing/severity/associated sxs/prior Treatment) HPI Comments: 40 year old male with a history of hypertension presents to the emergency department for complaints of high blood pressure and headache. Patient states that he was out of his hydrochlorothiazide, lisinopril, and Norvasc. This was refilled following an ED visit 2 days ago. Patient states that he took all 3 of these medications at noon yesterday. He took a nap in the afternoon and woke up "feeling terrible". He reports noticing a headache at approximately 2145 before going to work. Headache has been constant since this time without modifying factors. Pain is located in the patient's bilateral temples. He also feels a tightness in his neck. He has had some mild bilateral blurry vision. Patient complains of left-sided chest pain, but this has been constant since he was seen 2 days ago and evaluated for the same. He states that the medications for his blood pressure usually adequately control his hypertension, but his blood pressure continued to be high when checking at this evening. Patient iced fever, syncope, vision loss, extremity numbness or weakness, nausea, or vomiting.  The history is provided by the patient. No language interpreter was used.    Past Medical History  Diagnosis Date  . Hypertension   . Wears glasses    Past Surgical History  Procedure Laterality Date  . Cholecystectomy     Family History  Problem Relation Age of Onset  . Cancer Mother    Social History  Substance Use Topics  . Smoking status: Current Some Day Smoker -- 1.00 packs/day    Types: Cigars  . Smokeless tobacco: Never Used  . Alcohol Use: Yes     Comment: occ    Review of Systems  Constitutional:  Positive for fatigue. Negative for fever.  Gastrointestinal: Negative for nausea and vomiting.  Neurological: Positive for headaches. Negative for syncope and weakness.  All other systems reviewed and are negative.   Allergies  Penicillins  Home Medications   Prior to Admission medications   Medication Sig Start Date End Date Taking? Authorizing Provider  amLODipine (NORVASC) 10 MG tablet Take 1 tablet (10 mg total) by mouth daily. 05/07/15  Yes Orpah Greek, MD  hydrochlorothiazide (HYDRODIURIL) 25 MG tablet Take 1 tablet (25 mg total) by mouth daily. 05/07/15  Yes Orpah Greek, MD  lisinopril (PRINIVIL,ZESTRIL) 40 MG tablet Take 1 tablet (40 mg total) by mouth daily. 05/07/15  Yes Orpah Greek, MD  benzonatate (TESSALON) 100 MG capsule Take 2 capsules (200 mg total) by mouth 2 (two) times daily as needed for cough. Patient not taking: Reported on 05/07/2015 02/23/15   Nona Dell, PA-C  chlorpheniramine-HYDROcodone [facility] ER) 10-8 MG/5ML SUER Take 5 mLs by mouth every 12 (twelve) hours as needed for cough. Patient not taking: Reported on 05/07/2015 02/22/15   Shanon Rosser, MD  naproxen (NAPROSYN) 500 MG tablet Take 1 tablet (500 mg total) by mouth 2 (two) times daily as needed for moderate pain. 05/07/15   Orpah Greek, MD  oxymetazoline (AFRIN NASAL SPRAY) 0.05 % nasal spray Place 1 spray into both nostrils 2 (two) times daily. Patient not taking: Reported on 05/07/2015 02/23/15   Nona Dell, PA-C  traMADol (ULTRAM) 50 MG tablet Take 1 tablet (50 mg total)  by mouth every 6 (six) hours as needed. Patient taking differently: Take 50 mg by mouth every 6 (six) hours as needed for moderate pain.  05/07/15   Orpah Greek, MD   BP 153/105 mmHg  Pulse 64  Temp(Src) 97.9 F (36.6 C) (Oral)  Resp 14  SpO2 97%   Physical Exam  Constitutional: He is oriented to person, place, and time. He appears well-developed and  well-nourished. No distress.  Patient sleeping on initial presentation. When awoken, he is in no acute distress. Nontoxic appearing.  HENT:  Head: Normocephalic and atraumatic.  Mouth/Throat: Oropharynx is clear and moist. No oropharyngeal exudate.  Eyes: Conjunctivae and EOM are normal. Pupils are equal, round, and reactive to light. No scleral icterus.  Neck: Normal range of motion.  No nuchal rigidity or meningismus  Cardiovascular: Normal rate, regular rhythm and intact distal pulses.   Pulmonary/Chest: Effort normal and breath sounds normal. No respiratory distress. He has no wheezes. He has no rales.  Lungs clear bilaterally. Respirations even and unlabored  Musculoskeletal: Normal range of motion.  Neurological: He is alert and oriented to person, place, and time. No cranial nerve deficit. He exhibits normal muscle tone. Coordination normal.  GCS 15. Patient moving all extremities. No focal deficits noted.  Skin: Skin is warm and dry. No rash noted. He is not diaphoretic. No erythema. No pallor.  Psychiatric: He has a normal mood and affect. His behavior is normal.  Nursing note and vitals reviewed.   ED Course  Procedures (including critical care time) Labs Review Labs Reviewed  CBC WITH DIFFERENTIAL/PLATELET - Abnormal; Notable for the following:    Hemoglobin 18.2 (*)    All other components within normal limits  COMPREHENSIVE METABOLIC PANEL - Abnormal; Notable for the following:    Chloride 99 (*)    CO2 33 (*)    All other components within normal limits  I-STAT TROPOININ, ED    Imaging Review No results found.   I have personally reviewed and evaluated these images and lab results as part of my medical decision-making.   EKG Interpretation   Date/Time:  Tuesday May 10 2015 02:28:59 EST Ventricular Rate:  67 PR Interval:  200 QRS Duration: 98 QT Interval:  436 QTC Calculation: 460 R Axis:   82 Text Interpretation:  Sinus rhythm Probable left atrial  enlargement LVH  with secondary repolarization abnormality Anterior ST elevation, probably  due to LVH nsc Confirmed by POLLINA  MD, CHRISTOPHER [phone]) on 05/10/2015  2:44:49 AM      MDM   Final diagnoses:  Essential hypertension    40 year old male with a history of hypertension presents to the emergency department for complaints of persistent hypertension with headache. Patient also complaining of left-sided chest pain which has been constant since he was previously evaluated on 05/07/2015. EKG is unchanged compared to prior. Troponin is negative. Headache likely secondary to symptomatic hypertension; no focal neurologic deficits on exam today. Doubt emergent intracranial etiology, such as hemorrhage or mass.   Patient's blood pressure appears stable compared to evaluation on 05/07/2015. It has improved slightly after the patient received clonidine. He reports starting his blood pressure medications at noon yesterday. It is likely that he has not reached a therapeutic dose of these medications to adequately control his blood pressure. No laboratory changes to suggest organ damage/admission for hypertensive urgency or emergency. No indication for further emergent workup at this time. Patient has been referred to a primary doctor for recheck of his blood pressure. Will  not make any changes to his medication regimens currently. Return precautions provided. Patient discharged in satisfactory condition.   Filed Vitals:   05/10/15 0220 05/10/15 0302 05/10/15 0332 05/10/15 0413  BP: 172/115 176/114 164/109 153/105  Pulse: 64  59 64  Temp:      TempSrc:      Resp: 18  14 14   SpO2: 100%  97% 97%     Antonietta Breach, PA-C 05/10/15 [id number]  Orpah Greek, MD 05/10/15 [phone]

## 2015-05-10 NOTE — Discharge Instructions (Signed)
Continue taking your prescribed medications. Decrease your salt intake and the amount of processed foods you eat. Take your blood pressure in the morning for the most accurate reading. Follow-up with a primary care doctor for a recheck of your blood pressure and 2-3 days. You may follow-up with a cardiologist if desired.  Hypertension Hypertension, commonly called high blood pressure, is when the force of blood pumping through your arteries is too strong. Your arteries are the blood vessels that carry blood from your heart throughout your body. A blood pressure reading consists of a higher number over a lower number, such as 110/72. The higher number (systolic) is the pressure inside your arteries when your heart pumps. The lower number (diastolic) is the pressure inside your arteries when your heart relaxes. Ideally you want your blood pressure below 120/80. Hypertension forces your heart to work harder to pump blood. Your arteries may become narrow or stiff. Having untreated or uncontrolled hypertension can cause heart attack, stroke, kidney disease, and other problems. RISK FACTORS Some risk factors for high blood pressure are controllable. Others are not.  Risk factors you cannot control include:   Race. You may be at higher risk if you are African American.  Age. Risk increases with age.  Gender. Men are at higher risk than women before age 47 years. After age 4, women are at higher risk than men. Risk factors you can control include:  Not getting enough exercise or physical activity.  Being overweight.  Getting too much fat, sugar, calories, or salt in your diet.  Drinking too much alcohol. SIGNS AND SYMPTOMS Hypertension does not usually cause signs or symptoms. Extremely high blood pressure (hypertensive crisis) may cause headache, anxiety, shortness of breath, and nosebleed. DIAGNOSIS To check if you have hypertension, your health care provider will measure your blood pressure  while you are seated, with your arm held at the level of your heart. It should be measured at least twice using the same arm. Certain conditions can cause a difference in blood pressure between your right and left arms. A blood pressure reading that is higher than normal on one occasion does not mean that you need treatment. If it is not clear whether you have high blood pressure, you may be asked to return on a different day to have your blood pressure checked again. Or, you may be asked to monitor your blood pressure at home for 1 or more weeks. TREATMENT Treating high blood pressure includes making lifestyle changes and possibly taking medicine. Living a healthy lifestyle can help lower high blood pressure. You may need to change some of your habits. Lifestyle changes may include:  Following the DASH diet. This diet is high in fruits, vegetables, and whole grains. It is low in salt, red meat, and added sugars.  Keep your sodium intake below 2,300 mg per day.  Getting at least 30-45 minutes of aerobic exercise at least 4 times per week.  Losing weight if necessary.  Not smoking.  Limiting alcoholic beverages.  Learning ways to reduce stress. Your health care provider may prescribe medicine if lifestyle changes are not enough to get your blood pressure under control, and if one of the following is true:  You are 53-65 years of age and your systolic blood pressure is above 140.  You are 56 years of age or older, and your systolic blood pressure is above 150.  Your diastolic blood pressure is above 90.  You have diabetes, and your systolic blood pressure is  over [id number] or your diastolic blood pressure is over 90.  You have kidney disease and your blood pressure is above 140/90.  You have heart disease and your blood pressure is above 140/90. Your personal target blood pressure may vary depending on your medical conditions, your age, and other factors. HOME CARE INSTRUCTIONS  Have your  blood pressure rechecked as directed by your health care provider.   Take medicines only as directed by your health care provider. Follow the directions carefully. Blood pressure medicines must be taken as prescribed. The medicine does not work as well when you skip doses. Skipping doses also puts you at risk for problems.  Do not smoke.   Monitor your blood pressure at home as directed by your health care provider. SEEK MEDICAL CARE IF:   You think you are having a reaction to medicines taken.  You have recurrent headaches or feel dizzy.  You have swelling in your ankles.  You have trouble with your vision. SEEK IMMEDIATE MEDICAL CARE IF:  You develop a severe headache or confusion.  You have unusual weakness, numbness, or feel faint.  You have severe chest or abdominal pain.  You vomit repeatedly.  You have trouble breathing. MAKE SURE YOU:   Understand these instructions.  Will watch your condition.  Will get help right away if you are not doing well or get worse.   This information is not intended to replace advice given to you by your health care provider. Make sure you discuss any questions you have with your health care provider.   Document Released: 02/26/2005 Document Revised: 07/13/2014 Document Reviewed: 12/19/2012 Elsevier Interactive Patient Education 2016 [place] DASH stands for "Dietary Approaches to Stop Hypertension." The DASH eating plan is a healthy eating plan that has been shown to reduce high blood pressure (hypertension). Additional health benefits may include reducing the risk of type 2 diabetes mellitus, heart disease, and stroke. The DASH eating plan may also help with weight loss. WHAT DO I NEED TO KNOW ABOUT THE DASH EATING PLAN? For the DASH eating plan, you will follow these general guidelines:  Choose foods with a percent daily value for sodium of less than 5% (as listed on the food label).  Use salt-free  seasonings or herbs instead of table salt or sea salt.  Check with your health care provider or pharmacist before using salt substitutes.  Eat lower-sodium products, often labeled as "lower sodium" or "no salt added."  Eat fresh foods.  Eat more vegetables, fruits, and low-fat dairy products.  Choose whole grains. Look for the word "whole" as the first word in the ingredient list.  Choose fish and skinless chicken or [place] more often than red meat. Limit fish, poultry, and meat to 6 oz (170 g) each day.  Limit sweets, desserts, sugars, and sugary drinks.  Choose heart-healthy fats.  Limit cheese to 1 oz (28 g) per day.  Eat more home-cooked food and less restaurant, buffet, and fast food.  Limit fried foods.  Cook foods using methods other than frying.  Limit canned vegetables. If you do use them, rinse them well to decrease the sodium.  When eating at a restaurant, ask that your food be prepared with less salt, or no salt if possible. WHAT FOODS CAN I EAT? Seek help from a dietitian for individual calorie needs. Grains Whole grain or whole wheat bread. Brown rice. Whole grain or whole wheat pasta. Quinoa, bulgur, and whole grain cereals. Low-sodium cereals.  Corn or whole wheat flour tortillas. Whole grain cornbread. Whole grain crackers. Low-sodium crackers. Vegetables Fresh or frozen vegetables (raw, steamed, roasted, or grilled). Low-sodium or reduced-sodium tomato and vegetable juices. Low-sodium or reduced-sodium tomato sauce and paste. Low-sodium or reduced-sodium canned vegetables.  Fruits All fresh, canned (in natural juice), or frozen fruits. Meat and Other Protein Products Ground beef (85% or leaner), grass-fed beef, or beef trimmed of fat. Skinless chicken or [place]. Ground chicken or [place]. Pork trimmed of fat. All fish and seafood. Eggs. Dried beans, peas, or lentils. Unsalted nuts and seeds. Unsalted canned beans. Dairy Low-fat dairy products, such as skim or  1% milk, 2% or reduced-fat cheeses, low-fat ricotta or cottage cheese, or plain low-fat yogurt. Low-sodium or reduced-sodium cheeses. Fats and Oils Tub margarines without trans fats. Light or reduced-fat mayonnaise and salad dressings (reduced sodium). Avocado. Safflower, olive, or canola oils. Natural peanut or almond butter. Other Unsalted popcorn and pretzels. The items listed above may not be a complete list of recommended foods or beverages. Contact your dietitian for more options. WHAT FOODS ARE NOT RECOMMENDED? Grains White bread. White pasta. White rice. Refined cornbread. Bagels and croissants. Crackers that contain trans fat. Vegetables Creamed or fried vegetables. Vegetables in a cheese sauce. Regular canned vegetables. Regular canned tomato sauce and paste. Regular tomato and vegetable juices. Fruits Dried fruits. Canned fruit in light or heavy syrup. Fruit juice. Meat and Other Protein Products Fatty cuts of meat. Ribs, chicken wings, bacon, sausage, bologna, salami, chitterlings, fatback, hot dogs, bratwurst, and packaged luncheon meats. Salted nuts and seeds. Canned beans with salt. Dairy Whole or 2% milk, cream, half-and-half, and cream cheese. Whole-fat or sweetened yogurt. Full-fat cheeses or blue cheese. Nondairy creamers and whipped toppings. Processed cheese, cheese spreads, or cheese curds. Condiments Onion and garlic salt, seasoned salt, table salt, and sea salt. Canned and packaged gravies. Worcestershire sauce. Tartar sauce. Barbecue sauce. Teriyaki sauce. Soy sauce, including reduced sodium. Steak sauce. Fish sauce. Oyster sauce. Cocktail sauce. Horseradish. Ketchup and mustard. Meat flavorings and tenderizers. Bouillon cubes. Hot sauce. Tabasco sauce. Marinades. Taco seasonings. Relishes. Fats and Oils Butter, stick margarine, lard, shortening, ghee, and bacon fat. Coconut, palm kernel, or palm oils. Regular salad dressings. Other Pickles and olives. Salted popcorn  and pretzels. The items listed above may not be a complete list of foods and beverages to avoid. Contact your dietitian for more information. WHERE CAN I FIND MORE INFORMATION? National Heart, Lung, and Blood Institute: travelstabloid.com   This information is not intended to replace advice given to you by your health care provider. Make sure you discuss any questions you have with your health care provider.   Document Released: 02/15/2011 Document Revised: 03/19/2014 Document Reviewed: 12/31/2012 Elsevier Interactive Patient Education [employer].

## 2015-05-12 ENCOUNTER — Encounter: Payer: Self-pay | Admitting: Physician Assistant

## 2015-05-12 ENCOUNTER — Ambulatory Visit: Payer: Self-pay | Attending: Physician Assistant | Admitting: Physician Assistant

## 2015-05-12 VITALS — BP 148/100 | HR 78 | Temp 98.1°F | Resp 18 | Ht 68.0 in | Wt 213.8 lb

## 2015-05-12 DIAGNOSIS — K0889 Other specified disorders of teeth and supporting structures: Secondary | ICD-10-CM | POA: Insufficient documentation

## 2015-05-12 DIAGNOSIS — M62838 Other muscle spasm: Secondary | ICD-10-CM | POA: Insufficient documentation

## 2015-05-12 DIAGNOSIS — I10 Essential (primary) hypertension: Secondary | ICD-10-CM | POA: Insufficient documentation

## 2015-05-12 DIAGNOSIS — R0781 Pleurodynia: Secondary | ICD-10-CM | POA: Insufficient documentation

## 2015-05-12 DIAGNOSIS — Z79899 Other long term (current) drug therapy: Secondary | ICD-10-CM | POA: Insufficient documentation

## 2015-05-12 MED ORDER — METAXALONE 800 MG PO TABS: 800.0000 mg | ORAL_TABLET | Freq: Three times a day (TID) | ORAL | Status: DC

## 2015-05-12 MED ORDER — CLONIDINE HCL 0.1 MG PO TABS
0.1000 mg | ORAL_TABLET | Freq: Once | ORAL | Status: AC
  Administered 2015-05-12: 0.1 mg via ORAL

## 2015-05-12 MED ORDER — METHOCARBAMOL 750 MG PO TABS
750.0000 mg | ORAL_TABLET | Freq: Four times a day (QID) | ORAL | Status: DC
Start: 2015-05-12 — End: 2015-07-07

## 2015-05-12 MED ORDER — MELOXICAM 15 MG PO TABS: 15.0000 mg | ORAL_TABLET | Freq: Every day | ORAL | Status: DC

## 2015-05-12 MED FILL — MELOXICAM 15 MG TABLET: 15 | 30 days supply | Qty: 30 | Fill #0

## 2015-05-12 MED FILL — METHOCARBAMOL 750 MG TABLET: 750 | 22 days supply | Qty: 90 | Fill #0

## 2015-05-12 NOTE — Progress Notes (Signed)
Patient's here for ED f/up, HTN. Patient c/o chest pain. He seem to think its a strain muscle located under his left side under rib cage, painful to take a deep breath.  Patient also c/o having pain in left wrist. He was due to have an MRI, but never made appt. He's been dealing with the soreness in his wrist since 08/2014. Pain has not gotten any better. Patient does wear a wrist brace.  Patient states he took his BP meds at 11am although his BP is elevated.   Patient declines A1c screening and flu shot.

## 2015-05-12 NOTE — Patient Instructions (Signed)
Chest Wall Pain Chest wall pain is pain in or around the bones and muscles of your chest. Sometimes, an injury causes this pain. Sometimes, the cause may not be known. This pain may take several weeks or longer to get better. HOME CARE INSTRUCTIONS  Pay attention to any changes in your symptoms. Take these actions to help with your pain:   Rest as told by your health care provider.   Avoid activities that cause pain. These include any activities that use your chest muscles or your abdominal and side muscles to lift heavy items.   If directed, apply ice to the painful area:  Put ice in a plastic bag.  Place a towel between your skin and the bag.  Leave the ice on for 20 minutes, 2-3 times per day.  Take over-the-counter and prescription medicines only as told by your health care provider.  Do not use tobacco products, including cigarettes, chewing tobacco, and e-cigarettes. If you need help quitting, ask your health care provider.  Keep all follow-up visits as told by your health care provider. This is important. SEEK MEDICAL CARE IF:  You have a fever.  Your chest pain becomes worse.  You have new symptoms. SEEK IMMEDIATE MEDICAL CARE IF:  You have nausea or vomiting.  You feel sweaty or light-headed.  You have a cough with phlegm (sputum) or you cough up blood.  You develop shortness of breath.   This information is not intended to replace advice given to you by your health care provider. Make sure you discuss any questions you have with your health care provider.   Document Released: 02/26/2005 Document Revised: 11/17/2014 Document Reviewed: 05/24/2014 Elsevier Interactive Patient Education 2016 [place] DASH stands for "Dietary Approaches to Stop Hypertension." The DASH eating plan is a healthy eating plan that has been shown to reduce high blood pressure (hypertension). Additional health benefits may include reducing the risk of type 2  diabetes mellitus, heart disease, and stroke. The DASH eating plan may also help with weight loss. WHAT DO I NEED TO KNOW ABOUT THE DASH EATING PLAN? For the DASH eating plan, you will follow these general guidelines:  Choose foods with a percent daily value for sodium of less than 5% (as listed on the food label).  Use salt-free seasonings or herbs instead of table salt or sea salt.  Check with your health care provider or pharmacist before using salt substitutes.  Eat lower-sodium products, often labeled as "lower sodium" or "no salt added."  Eat fresh foods.  Eat more vegetables, fruits, and low-fat dairy products.  Choose whole grains. Look for the word "whole" as the first word in the ingredient list.  Choose fish and skinless chicken or [place] more often than red meat. Limit fish, poultry, and meat to 6 oz (170 g) each day.  Limit sweets, desserts, sugars, and sugary drinks.  Choose heart-healthy fats.  Limit cheese to 1 oz (28 g) per day.  Eat more home-cooked food and less restaurant, buffet, and fast food.  Limit fried foods.  Cook foods using methods other than frying.  Limit canned vegetables. If you do use them, rinse them well to decrease the sodium.  When eating at a restaurant, ask that your food be prepared with less salt, or no salt if possible. WHAT FOODS CAN I EAT? Seek help from a dietitian for individual calorie needs. Grains Whole grain or whole wheat bread. Brown rice. Whole grain or whole wheat pasta. Quinoa, bulgur,  and whole grain cereals. Low-sodium cereals. Corn or whole wheat flour tortillas. Whole grain cornbread. Whole grain crackers. Low-sodium crackers. Vegetables Fresh or frozen vegetables (raw, steamed, roasted, or grilled). Low-sodium or reduced-sodium tomato and vegetable juices. Low-sodium or reduced-sodium tomato sauce and paste. Low-sodium or reduced-sodium canned vegetables.  Fruits All fresh, canned (in natural juice), or frozen  fruits. Meat and Other Protein Products Ground beef (85% or leaner), grass-fed beef, or beef trimmed of fat. Skinless chicken or [place]. Ground chicken or [place]. Pork trimmed of fat. All fish and seafood. Eggs. Dried beans, peas, or lentils. Unsalted nuts and seeds. Unsalted canned beans. Dairy Low-fat dairy products, such as skim or 1% milk, 2% or reduced-fat cheeses, low-fat ricotta or cottage cheese, or plain low-fat yogurt. Low-sodium or reduced-sodium cheeses. Fats and Oils Tub margarines without trans fats. Light or reduced-fat mayonnaise and salad dressings (reduced sodium). Avocado. Safflower, olive, or canola oils. Natural peanut or almond butter. Other Unsalted popcorn and pretzels. The items listed above may not be a complete list of recommended foods or beverages. Contact your dietitian for more options. WHAT FOODS ARE NOT RECOMMENDED? Grains White bread. White pasta. White rice. Refined cornbread. Bagels and croissants. Crackers that contain trans fat. Vegetables Creamed or fried vegetables. Vegetables in a cheese sauce. Regular canned vegetables. Regular canned tomato sauce and paste. Regular tomato and vegetable juices. Fruits Dried fruits. Canned fruit in light or heavy syrup. Fruit juice. Meat and Other Protein Products Fatty cuts of meat. Ribs, chicken wings, bacon, sausage, bologna, salami, chitterlings, fatback, hot dogs, bratwurst, and packaged luncheon meats. Salted nuts and seeds. Canned beans with salt. Dairy Whole or 2% milk, cream, half-and-half, and cream cheese. Whole-fat or sweetened yogurt. Full-fat cheeses or blue cheese. Nondairy creamers and whipped toppings. Processed cheese, cheese spreads, or cheese curds. Condiments Onion and garlic salt, seasoned salt, table salt, and sea salt. Canned and packaged gravies. Worcestershire sauce. Tartar sauce. Barbecue sauce. Teriyaki sauce. Soy sauce, including reduced sodium. Steak sauce. Fish sauce. Oyster sauce. Cocktail  sauce. Horseradish. Ketchup and mustard. Meat flavorings and tenderizers. Bouillon cubes. Hot sauce. Tabasco sauce. Marinades. Taco seasonings. Relishes. Fats and Oils Butter, stick margarine, lard, shortening, ghee, and bacon fat. Coconut, palm kernel, or palm oils. Regular salad dressings. Other Pickles and olives. Salted popcorn and pretzels. The items listed above may not be a complete list of foods and beverages to avoid. Contact your dietitian for more information. WHERE CAN I FIND MORE INFORMATION? National Heart, Lung, and Blood Institute: travelstabloid.com   This information is not intended to replace advice given to you by your health care provider. Make sure you discuss any questions you have with your health care provider.   Document Released: 02/15/2011 Document Revised: 03/19/2014 Document Reviewed: 12/31/2012 Elsevier Interactive Patient Education 2016 [employer]. Hypertension Hypertension, commonly called high blood pressure, is when the force of blood pumping through your arteries is too strong. Your arteries are the blood vessels that carry blood from your heart throughout your body. A blood pressure reading consists of a higher number over a lower number, such as 110/72. The higher number (systolic) is the pressure inside your arteries when your heart pumps. The lower number (diastolic) is the pressure inside your arteries when your heart relaxes. Ideally you want your blood pressure below 120/80. Hypertension forces your heart to work harder to pump blood. Your arteries may become narrow or stiff. Having untreated or uncontrolled hypertension can cause heart attack, stroke, kidney disease, and other problems. RISK FACTORS  Some risk factors for high blood pressure are controllable. Others are not.  Risk factors you cannot control include:   Race. You may be at higher risk if you are African American.  Age. Risk increases with  age.  Gender. Men are at higher risk than women before age 29 years. After age 47, women are at higher risk than men. Risk factors you can control include:  Not getting enough exercise or physical activity.  Being overweight.  Getting too much fat, sugar, calories, or salt in your diet.  Drinking too much alcohol. SIGNS AND SYMPTOMS Hypertension does not usually cause signs or symptoms. Extremely high blood pressure (hypertensive crisis) may cause headache, anxiety, shortness of breath, and nosebleed. DIAGNOSIS To check if you have hypertension, your health care provider will measure your blood pressure while you are seated, with your arm held at the level of your heart. It should be measured at least twice using the same arm. Certain conditions can cause a difference in blood pressure between your right and left arms. A blood pressure reading that is higher than normal on one occasion does not mean that you need treatment. If it is not clear whether you have high blood pressure, you may be asked to return on a different day to have your blood pressure checked again. Or, you may be asked to monitor your blood pressure at home for 1 or more weeks. TREATMENT Treating high blood pressure includes making lifestyle changes and possibly taking medicine. Living a healthy lifestyle can help lower high blood pressure. You may need to change some of your habits. Lifestyle changes may include:  Following the DASH diet. This diet is high in fruits, vegetables, and whole grains. It is low in salt, red meat, and added sugars.  Keep your sodium intake below 2,300 mg per day.  Getting at least 30-45 minutes of aerobic exercise at least 4 times per week.  Losing weight if necessary.  Not smoking.  Limiting alcoholic beverages.  Learning ways to reduce stress. Your health care provider may prescribe medicine if lifestyle changes are not enough to get your blood pressure under control, and if one of  the following is true:  You are 77-41 years of age and your systolic blood pressure is above 140.  You are 25 years of age or older, and your systolic blood pressure is above 150.  Your diastolic blood pressure is above 90.  You have diabetes, and your systolic blood pressure is over [id number] or your diastolic blood pressure is over 90.  You have kidney disease and your blood pressure is above 140/90.  You have heart disease and your blood pressure is above 140/90. Your personal target blood pressure may vary depending on your medical conditions, your age, and other factors. HOME CARE INSTRUCTIONS  Have your blood pressure rechecked as directed by your health care provider.   Take medicines only as directed by your health care provider. Follow the directions carefully. Blood pressure medicines must be taken as prescribed. The medicine does not work as well when you skip doses. Skipping doses also puts you at risk for problems.  Do not smoke.   Monitor your blood pressure at home as directed by your health care provider. SEEK MEDICAL CARE IF:   You think you are having a reaction to medicines taken.  You have recurrent headaches or feel dizzy.  You have swelling in your ankles.  You have trouble with your vision. SEEK IMMEDIATE MEDICAL CARE IF:  You develop a severe headache or confusion.  You have unusual weakness, numbness, or feel faint.  You have severe chest or abdominal pain.  You vomit repeatedly.  You have trouble breathing. MAKE SURE YOU:   Understand these instructions.  Will watch your condition.  Will get help right away if you are not doing well or get worse.   This information is not intended to replace advice given to you by your health care provider. Make sure you discuss any questions you have with your health care provider.   Document Released: 02/26/2005 Document Revised: 07/13/2014 Document Reviewed: 12/19/2012 Elsevier Interactive Patient  Education [employer].

## 2015-05-12 NOTE — Progress Notes (Signed)
Patient ID: [name], male   DOB: 25-May-1975, 40 y.o.   MRN: [id number]   [name], is a 40 y.o. male  [id number]  [id number]  DOB - 1975/10/22  Chief Complaint  Patient presents with  . Follow-up  . Hypertension        Subjective:   [name] is a 40 y.o. male here today for a follow up visit for uncontrolled htn, pleuritic CP, and poor dentition.  He has only restarted his BP meds 2 days ago.  He does c/o intermittent HAs without vision changes.  His CP was worked up in the ER and there was no evidence of ischemia/infarction.  The pain seems to have been precipitated with lifting boxes at work.  He denies claudication type symptoms, no radiating pain, no jaw pain,  He denies paresthesias.  He did not get his prescriptions filled for tramadol or Naproxen due to cost.  He denies alcohol use except on special occasions.  Minimal snoring.  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  No abdominal pain - No Nausea, No new weakness tingling or numbness, No Cough - SOB.   PAST MEDICAL HISTORY: Past Medical History  Diagnosis Date  . Hypertension   . Wears glasses     MEDICATIONS AT HOME: Prior to Admission medications   Medication Sig Start Date End Date Taking? Authorizing Provider  amLODipine (NORVASC) 10 MG tablet Take 1 tablet (10 mg total) by mouth daily. 05/07/15  Yes Orpah Greek, MD  hydrochlorothiazide (HYDRODIURIL) 25 MG tablet Take 1 tablet (25 mg total) by mouth daily. 05/07/15  Yes Orpah Greek, MD  lisinopril (PRINIVIL,ZESTRIL) 40 MG tablet Take 1 tablet (40 mg total) by mouth daily. 05/07/15  Yes Orpah Greek, MD  naproxen (NAPROSYN) 500 MG tablet Take 1 tablet (500 mg total) by mouth 2 (two) times daily as needed for moderate pain. 05/07/15  Yes Orpah Greek, MD  meloxicam (MOBIC) 15 MG tablet Take 1 tablet (15 mg total) by mouth daily. 05/12/15   Argentina Donovan, PA-C  metaxalone (SKELAXIN) 800 MG tablet Take 1 tablet (800 mg total) by  mouth 3 (three) times daily. 05/12/15   Argentina Donovan, PA-C  oxymetazoline (AFRIN NASAL SPRAY) 0.05 % nasal spray Place 1 spray into both nostrils 2 (two) times daily. Patient not taking: Reported on 05/07/2015 02/23/15   Nona Dell, PA-C     Objective:   Filed Vitals:   05/12/15 1646 05/12/15 1707 05/12/15 1724  BP: 160/113 160/118 148/100  Pulse: 78    Temp: 98.1 F (36.7 C)    TempSrc: Oral    Resp: 18    Height: 5\' 8"  (1.727 m)    Weight: 213 lb 12.8 oz (96.979 kg)    SpO2: 95%      Exam General appearance : Awake, alert, not in any distress. Speech Clear. Not toxic looking HEENT: Atraumatic and Normocephalic, pupils equally reactive to light and accomodation.  Extremely poor dentition and decay.  No obvious abscesses.  Neck: supple, no JVD. No cervical lymphadenopathy.  Chest:Good air entry bilaterally, no added sounds. TTP along lower L chest wal CVS: S1 S2 regular, no murmurs.  Abdomen: Bowel sounds present, Non tender and not distended with no gaurding, rigidity or rebound. Extremities: B/L Lower Ext shows no edema, both legs are warm to touch Neurology: Awake alert, and oriented X 3, CN II-XII intact, Non focal Skin:No Rash  Data Review    Assessment & Plan  1. Essential hypertension-long standing; uncontrolled and has been off meds Continue  Lisinopril, amlodipine, and HCTZ(these were only restarted 2 days ago) - cloNIDine (CATAPRES) tablet 0.1 mg; Take 1 tablet (0.1 mg total) by mouth once.  Discussed dangers at length of his htn being untreated. Warnings for calling 911 reviewed.    2. Pleuritic chest pain No evidence of cardiac etiology. - meloxicam (MOBIC) 15 MG tablet; Take 1 tablet (15 mg total) by mouth daily.  Dispense: 30 tablet; Refill: 2 - metaxalone (SKELAXIN) 800 MG tablet; Take 1 tablet (800 mg total) by mouth 3 (three) times daily.  Dispense: 90 tablet; Refill: 0  If CP worsens, changes in nature-call 911  3. Muscle spasm  -  meloxicam (MOBIC) 15 MG tablet; Take 1 tablet (15 mg total) by mouth daily.  Dispense: 30 tablet; Refill: 2 - metaxalone (SKELAXIN) 800 MG tablet; Take 1 tablet (800 mg total) by mouth 3 (three) times daily.  Dispense: 90 tablet; Refill: 0(willl need to get through patient assistance program) so, for now, I will prescribe robaxin.  4. Tooth pain/poor dentition-will likely need multiple extractions and extensive dental care.  For now, the above meds can be use to help with pain and good dental hygiene is advised while he awaits dental insurance.  Patient have been counseled extensively about nutrition and exercise  Return in about 2 weeks (around 05/26/2015) for establish care; htn; muscle spasm; poor dentition.  The patient was given clear instructions to go to ER or return to medical center if symptoms don't improve, worsen or new problems develop. The patient verbalized understanding. The patient was told to call to get lab results if they haven't heard anything in the next week.    Freeman Caldron, PA-C [facility] [place] [facility] [place], [place]   05/12/2015, 5:29 PM

## 2015-05-27 ENCOUNTER — Encounter: Payer: Self-pay | Admitting: Family Medicine

## 2015-05-27 ENCOUNTER — Ambulatory Visit: Payer: Self-pay | Attending: Family Medicine | Admitting: Family Medicine

## 2015-05-27 VITALS — BP 150/110 | HR 68 | Temp 97.5°F | Resp 15 | Ht 74.0 in | Wt 220.0 lb

## 2015-05-27 DIAGNOSIS — M25532 Pain in left wrist: Secondary | ICD-10-CM

## 2015-05-27 DIAGNOSIS — Z79899 Other long term (current) drug therapy: Secondary | ICD-10-CM | POA: Insufficient documentation

## 2015-05-27 DIAGNOSIS — K089 Disorder of teeth and supporting structures, unspecified: Secondary | ICD-10-CM

## 2015-05-27 DIAGNOSIS — K047 Periapical abscess without sinus: Secondary | ICD-10-CM | POA: Insufficient documentation

## 2015-05-27 DIAGNOSIS — I10 Essential (primary) hypertension: Secondary | ICD-10-CM

## 2015-05-27 DIAGNOSIS — K0530 Chronic periodontitis, unspecified: Secondary | ICD-10-CM

## 2015-05-27 DIAGNOSIS — Z87891 Personal history of nicotine dependence: Secondary | ICD-10-CM | POA: Insufficient documentation

## 2015-05-27 DIAGNOSIS — R079 Chest pain, unspecified: Secondary | ICD-10-CM | POA: Insufficient documentation

## 2015-05-27 MED ORDER — LISINOPRIL 40 MG PO TABS: 40.0000 mg | ORAL_TABLET | Freq: Every day | ORAL | Status: DC

## 2015-05-27 MED ORDER — CHLORHEXIDINE GLUCONATE 0.12 % MT SOLN: OROMUCOSAL | Status: DC

## 2015-05-27 MED ORDER — HYDROCHLOROTHIAZIDE 25 MG PO TABS: 25.0000 mg | ORAL_TABLET | Freq: Every day | ORAL | Status: DC

## 2015-05-27 MED ORDER — CLINDAMYCIN HCL 300 MG PO CAPS: 300.0000 mg | ORAL_CAPSULE | Freq: Three times a day (TID) | ORAL | Status: DC

## 2015-05-27 MED ORDER — HYDRALAZINE HCL 10 MG PO TABS: 10.0000 mg | ORAL_TABLET | Freq: Three times a day (TID) | ORAL | Status: DC

## 2015-05-27 MED ORDER — AMLODIPINE BESYLATE 10 MG PO TABS: 10.0000 mg | ORAL_TABLET | Freq: Every day | ORAL | Status: DC

## 2015-05-27 NOTE — Patient Instructions (Addendum)
[name] was seen today for establish care.  Diagnoses and all orders for this visit:  Poor dentition  Periodontitis -     clindamycin (CLEOCIN) 300 MG capsule; Take 1 capsule (300 mg total) by mouth 3 (three) times daily. -     chlorhexidine (PERIDEX) 0.12 % solution; Swish for 30 seconds with 15 mL (one capful) of undiluted oral rinse after toothbrushing, then expectorate; repeat twice daily  Essential hypertension -     amLODipine (NORVASC) 10 MG tablet; Take 1 tablet (10 mg total) by mouth daily. -     hydrochlorothiazide (HYDRODIURIL) 25 MG tablet; Take 1 tablet (25 mg total) by mouth daily. -     lisinopril (PRINIVIL,ZESTRIL) 40 MG tablet; Take 1 tablet (40 mg total) by mouth daily. -     hydrALAZINE (APRESOLINE) 10 MG tablet; Take 1 tablet (10 mg total) by mouth 3 (three) times daily.  Pain in left wrist   Avoid NSAIDs, use tylenol for pain up to 300 mg daily   Please apply for [place] discount and orange card, you can also inquire if any of your medications are on the PASS (medications assistance) list.   Will plan for MRI once you have insurance or [place] discount   F/u in 2 weeks for HTN  Dr. Adrian Blackwater

## 2015-05-27 NOTE — Progress Notes (Signed)
Patient saw [name] on 3/2 He is here to follow up on HTN, poor dentition, and chest wall muscle strain He reports his pain is 6/10 in his mouth (right lower) that has progressively gotten worse over last 48 hours He reports taking 1200 mg ibuprofen yesterday which helps some He reports his chest wall pain is better and he took his HTN medications this am around 1015 He does not have orange card/financial

## 2015-05-27 NOTE — Assessment & Plan Note (Addendum)
A: chronic HTN Med: compliant P: Add hydralazine 10 mg TID to regimen Avoid NSAID Tylenol only prn pain

## 2015-05-27 NOTE — Assessment & Plan Note (Signed)
A: chronic periodonitis P: Clindamycin to prevent abscess peridex rinse BID following course of clinda

## 2015-05-27 NOTE — Progress Notes (Signed)
Subjective:  Patient ID: [name], male    DOB: [birth year]  Age: 40 y.o. MRN: [id number]  CC: Establish Care   HPI [name] presents for    1. CHRONIC HYPERTENSION since age 39  Disease Monitoring  Blood pressure range: not checking   Chest pain: yes   Dyspnea: no   Claudication: no   Medication compliance: yes  Medication Side Effects  Lightheadedness: no   Urinary frequency: no   Edema: no     2. Dental pain: chronic. Worsened over past 48 hrs. No fever or chills. Pain is worse in R upper jaw.   3. L wrist pain: for past 5 months or so. He injured his wrist at work. He was treated by neuro who did a steroid injection. He has medial wrist pain that is worsened with wrist rotation. He wears a brace at work. He request MRI of wrist. He is currently uninsured.   Social History  Substance Use Topics  . Smoking status: Former Smoker -- 1.00 packs/day    Types: Cigars    Quit date: 05/10/2015  . Smokeless tobacco: Never Used  . Alcohol Use: Yes     Comment: occ   Outpatient Prescriptions Prior to Visit  Medication Sig Dispense Refill  . amLODipine (NORVASC) 10 MG tablet Take 1 tablet (10 mg total) by mouth daily. 30 tablet 0  . hydrochlorothiazide (HYDRODIURIL) 25 MG tablet Take 1 tablet (25 mg total) by mouth daily. 30 tablet 0  . lisinopril (PRINIVIL,ZESTRIL) 40 MG tablet Take 1 tablet (40 mg total) by mouth daily. 30 tablet 0  . methocarbamol (ROBAXIN) 750 MG tablet Take 1 tablet (750 mg total) by mouth 4 (four) times daily. Prn muscle spasm/pain 90 tablet 1  . meloxicam (MOBIC) 15 MG tablet Take 1 tablet (15 mg total) by mouth daily. (Patient not taking: Reported on 05/27/2015) 30 tablet 2  . metaxalone (SKELAXIN) 800 MG tablet Take 1 tablet (800 mg total) by mouth 3 (three) times daily. (Patient not taking: Reported on 05/27/2015) 90 tablet 0  . naproxen (NAPROSYN) 500 MG tablet Take 1 tablet (500 mg total) by mouth 2 (two) times daily as needed for  moderate pain. (Patient not taking: Reported on 05/27/2015) 15 tablet 0  . oxymetazoline (AFRIN NASAL SPRAY) 0.05 % nasal spray Place 1 spray into both nostrils 2 (two) times daily. (Patient not taking: Reported on 05/07/2015) 30 mL 0   No facility-administered medications prior to visit.    ROS Review of Systems  Constitutional: Negative for fever, chills, fatigue and unexpected weight change.  HENT: Positive for dental problem.   Eyes: Negative for visual disturbance.  Respiratory: Negative for cough and shortness of breath.   Cardiovascular: Positive for chest pain. Negative for palpitations and leg swelling.  Gastrointestinal: Negative for nausea, vomiting, abdominal pain, diarrhea, constipation and blood in stool.  Endocrine: Negative for polydipsia, polyphagia and polyuria.  Musculoskeletal: Positive for arthralgias. Negative for myalgias, back pain, gait problem and neck pain.  Skin: Negative for rash.  Allergic/Immunologic: Negative for immunocompromised state.  Hematological: Negative for adenopathy. Does not bruise/bleed easily.  Psychiatric/Behavioral: Negative for suicidal ideas, sleep disturbance and dysphoric mood. The patient is not nervous/anxious.     Objective:  BP 150/110 mmHg  Pulse 68  Temp(Src) 97.5 F (36.4 C)  Resp 15  Ht 6\' 2"  (1.88 m)  Wt 220 lb (99.791 kg)  BMI 28.23 kg/m2  SpO2 99%  BP/Weight 05/27/2015 05/12/2015 123XX123  Systolic BP Q000111Q  123456 0000000  Diastolic BP A999333 123XX123 123456  Wt. (Lbs) 220 213.8 -  BMI 28.23 32.52 -   Pulse Readings from Last 3 Encounters:  05/27/15 68  05/12/15 78  05/10/15 64    Physical Exam  Constitutional: He appears well-developed and well-nourished. No distress.  HENT:  Head: Normocephalic and atraumatic.  Mouth/Throat: Abnormal dentition. Dental caries present. No dental abscesses.  Many carries Many broken teeth   Neck: Normal range of motion. Neck supple.  Cardiovascular: Normal rate, regular rhythm, normal heart  sounds and intact distal pulses.   Pulmonary/Chest: Effort normal and breath sounds normal.  Musculoskeletal: He exhibits no edema.       Left wrist: He exhibits normal range of motion, no tenderness, no bony tenderness and no swelling.  Neurological: He is alert.  Skin: Skin is warm and dry. No rash noted. No erythema.  Psychiatric: He has a normal mood and affect.    Assessment & Plan:   There are no diagnoses linked to this encounter.  [name] was seen today for establish care.  Diagnoses and all orders for this visit:  Poor dentition  Periodontitis -     clindamycin (CLEOCIN) 300 MG capsule; Take 1 capsule (300 mg total) by mouth 3 (three) times daily. -     chlorhexidine (PERIDEX) 0.12 % solution; Swish for 30 seconds with 15 mL (one capful) of undiluted oral rinse after toothbrushing, then expectorate; repeat twice daily  Essential hypertension -     amLODipine (NORVASC) 10 MG tablet; Take 1 tablet (10 mg total) by mouth daily. -     Discontinue: hydrochlorothiazide (HYDRODIURIL) 25 MG tablet; Take 1 tablet (25 mg total) by mouth daily. -     Discontinue: lisinopril (PRINIVIL,ZESTRIL) 40 MG tablet; Take 1 tablet (40 mg total) by mouth daily. -     hydrALAZINE (APRESOLINE) 10 MG tablet; Take 1 tablet (10 mg total) by mouth 3 (three) times daily. -     lisinopril (PRINIVIL,ZESTRIL) 40 MG tablet; Take 1 tablet (40 mg total) by mouth daily. -     hydrochlorothiazide (HYDRODIURIL) 25 MG tablet; Take 1 tablet (25 mg total) by mouth daily.  Pain in left wrist   Meds ordered this encounter  Medications  . amLODipine (NORVASC) 10 MG tablet    Sig: Take 1 tablet (10 mg total) by mouth daily.    Dispense:  30 tablet    Refill:  5  . DISCONTD: hydrochlorothiazide (HYDRODIURIL) 25 MG tablet    Sig: Take 1 tablet (25 mg total) by mouth daily.    Dispense:  30 tablet    Refill:  5  . DISCONTD: lisinopril (PRINIVIL,ZESTRIL) 40 MG tablet    Sig: Take 1 tablet (40 mg total) by mouth  daily.    Dispense:  30 tablet    Refill:  5  . hydrALAZINE (APRESOLINE) 10 MG tablet    Sig: Take 1 tablet (10 mg total) by mouth 3 (three) times daily.    Dispense:  90 tablet    Refill:  1  . clindamycin (CLEOCIN) 300 MG capsule    Sig: Take 1 capsule (300 mg total) by mouth 3 (three) times daily.    Dispense:  21 capsule    Refill:  0  . chlorhexidine (PERIDEX) 0.12 % solution    Sig: Swish for 30 seconds with 15 mL (one capful) of undiluted oral rinse after toothbrushing, then expectorate; repeat twice daily    Dispense:  120 mL    Refill:  0  . lisinopril (PRINIVIL,ZESTRIL) 40 MG tablet    Sig: Take 1 tablet (40 mg total) by mouth daily.    Dispense:  30 tablet    Refill:  5  . hydrochlorothiazide (HYDRODIURIL) 25 MG tablet    Sig: Take 1 tablet (25 mg total) by mouth daily.    Dispense:  30 tablet    Refill:  5    Follow-up: No Follow-up on file.   Boykin Nearing MD

## 2015-05-27 NOTE — Assessment & Plan Note (Signed)
A; chronic ulnar wrist pain following injury  P: MRI once patient has insurance or [place] discount  For now continue bracing

## 2015-07-04 ENCOUNTER — Ambulatory Visit: Payer: Self-pay | Admitting: Family Medicine

## 2015-07-07 ENCOUNTER — Ambulatory Visit: Payer: Self-pay | Attending: Family Medicine | Admitting: Family Medicine

## 2015-07-07 ENCOUNTER — Encounter: Payer: Self-pay | Admitting: Family Medicine

## 2015-07-07 VITALS — BP 153/106 | HR 79 | Temp 98.4°F | Resp 18 | Ht 74.5 in | Wt 213.0 lb

## 2015-07-07 DIAGNOSIS — I10 Essential (primary) hypertension: Secondary | ICD-10-CM

## 2015-07-07 DIAGNOSIS — Z79899 Other long term (current) drug therapy: Secondary | ICD-10-CM | POA: Insufficient documentation

## 2015-07-07 DIAGNOSIS — L738 Other specified follicular disorders: Secondary | ICD-10-CM

## 2015-07-07 DIAGNOSIS — B350 Tinea barbae and tinea capitis: Secondary | ICD-10-CM

## 2015-07-07 MED ORDER — LISINOPRIL 40 MG PO TABS: 40.0000 mg | ORAL_TABLET | Freq: Every day | ORAL | Status: DC

## 2015-07-07 MED ORDER — HYDRALAZINE HCL 10 MG PO TABS: 10.0000 mg | ORAL_TABLET | Freq: Three times a day (TID) | ORAL | Status: DC

## 2015-07-07 MED ORDER — HYDROCHLOROTHIAZIDE 25 MG PO TABS: 25.0000 mg | ORAL_TABLET | Freq: Every day | ORAL | Status: DC

## 2015-07-07 MED ORDER — AMLODIPINE BESYLATE 10 MG PO TABS: 10.0000 mg | ORAL_TABLET | Freq: Every day | ORAL | Status: DC

## 2015-07-07 MED ORDER — CLINDAMYCIN PHOSPHATE 1 % EX GEL
Freq: Two times a day (BID) | CUTANEOUS | Status: DC
Start: 2015-07-07 — End: 2015-11-10

## 2015-07-07 MED FILL — LISINOPRIL 40 MG TABLET: 40 | 30 days supply | Qty: 30 | Fill #0

## 2015-07-07 MED FILL — CLINDAMYCIN PH 1% GEL: 1 | 15 days supply | Qty: 30 | Fill #0

## 2015-07-07 MED FILL — CHLORHEXIDINE 0.12% RINSE: 0.12 | 30 days supply | Qty: 473 | Fill #0

## 2015-07-07 MED FILL — ?HYDROCHLOROTHIAZIDE 25 MG: 25 MG | 30 days supply | Qty: 30 | Fill #0

## 2015-07-07 MED FILL — hydrALAZINE HCL 10 MG TABS: 10 | 30 days supply | Qty: 90 | Fill #0

## 2015-07-07 MED FILL — AMLODIPINE BESYLATE 10 MG T: 10 | 30 days supply | Qty: 30 | Fill #0

## 2015-07-07 MED FILL — CLINDAMYCIN HCL 300 MG CAP: 300 | 7 days supply | Qty: 21 | Fill #0

## 2015-07-07 NOTE — Progress Notes (Signed)
F/U HTN  No medication x 2 weeks  Former smoker- no cigarette x 1 month  No pain today  No suicidal thoughts in the past two weeks

## 2015-07-07 NOTE — Assessment & Plan Note (Signed)
Avoid shaving Lengthen clippers Clindamycin gel

## 2015-07-07 NOTE — Patient Instructions (Addendum)
[name] was seen today for hypertension.  Diagnoses and all orders for this visit:  Essential hypertension -     amLODipine (NORVASC) 10 MG tablet; Take 1 tablet (10 mg total) by mouth daily. -     hydrALAZINE (APRESOLINE) 10 MG tablet; Take 1 tablet (10 mg total) by mouth 3 (three) times daily. -     hydrochlorothiazide (HYDRODIURIL) 25 MG tablet; Take 1 tablet (25 mg total) by mouth daily. -     lisinopril (PRINIVIL,ZESTRIL) 40 MG tablet; Take 1 tablet (40 mg total) by mouth daily.  Folliculitis barbae -     clindamycin (CLINDAGEL) 1 % gel; Apply topically 2 (two) times daily.  continue not to shave Lengthen clippers to avoid skin cutting/irritation  Remember that there is med assistance at the on site pharmacy  F/u in 3-4 weeks for HTN   Dr. Adrian Blackwater   Folliculitis Folliculitis is redness, soreness, and swelling (inflammation) of the hair follicles. This condition can occur anywhere on the body. People with weakened immune systems, diabetes, or obesity have a greater risk of getting folliculitis. CAUSES  Bacterial infection. This is the most common cause.  Fungal infection.  Viral infection.  Contact with certain chemicals, especially oils and tars. Long-term folliculitis can result from bacteria that live in the nostrils. The bacteria may trigger multiple outbreaks of folliculitis over time. SYMPTOMS Folliculitis most commonly occurs on the scalp, thighs, legs, back, buttocks, and areas where hair is shaved frequently. An early sign of folliculitis is a small, white or yellow, pus-filled, itchy lesion (pustule). These lesions appear on a red, inflamed follicle. They are usually less than 0.2 inches (5 mm) wide. When there is an infection of the follicle that goes deeper, it becomes a boil or furuncle. A group of closely packed boils creates a larger lesion (carbuncle). Carbuncles tend to occur in hairy, sweaty areas of the body. DIAGNOSIS  Your caregiver can usually tell what  is wrong by doing a physical exam. A sample may be taken from one of the lesions and tested in a lab. This can help determine what is causing your folliculitis. TREATMENT  Treatment may include:  Applying warm compresses to the affected areas.  Taking antibiotic medicines orally or applying them to the skin.  Draining the lesions if they contain a large amount of pus or fluid.  Laser hair removal for cases of long-lasting folliculitis. This helps to prevent regrowth of the hair. HOME CARE INSTRUCTIONS  Apply warm compresses to the affected areas as directed by your caregiver.  If antibiotics are prescribed, take them as directed. Finish them even if you start to feel better.  You may take over-the-counter medicines to relieve itching.  Do not shave irritated skin.  Follow up with your caregiver as directed. SEEK IMMEDIATE MEDICAL CARE IF:   You have increasing redness, swelling, or pain in the affected area.  You have a fever. MAKE SURE YOU:  Understand these instructions.  Will watch your condition.  Will get help right away if you are not doing well or get worse.   This information is not intended to replace advice given to you by your health care provider. Make sure you discuss any questions you have with your health care provider.   Document Released: 05/07/2001 Document Revised: 03/19/2014 Document Reviewed: 05/29/2011 Elsevier Interactive Patient Education [employer].

## 2015-07-07 NOTE — Assessment & Plan Note (Signed)
Uncontrolled due to non compliance Refilled out meds

## 2015-07-07 NOTE — Progress Notes (Signed)
Subjective:  Patient ID: [name], male    DOB: 25-Nov-1975  Age: 40 y.o. MRN: [id number]  CC: Hypertension   HPI [name] presents for   1. CHRONIC HYPERTENSION  Disease Monitoring  Blood pressure range: not checking   Chest pain: no   Dyspnea: no   Claudication: no   Medication compliance: no, limited by cost  Medication Side Effects  Lightheadedness: yes   Urinary frequency: no   Edema: no    2. Folliculitis barbae: worse on R side of neck. Has hair cut short with trimmers. Skin is irritated. Has hair bumps.   Outpatient Prescriptions Prior to Visit  Medication Sig Dispense Refill  . amLODipine (NORVASC) 10 MG tablet Take 1 tablet (10 mg total) by mouth daily. 30 tablet 5  . chlorhexidine (PERIDEX) 0.12 % solution Swish for 30 seconds with 15 mL (one capful) of undiluted oral rinse after toothbrushing, then expectorate; repeat twice daily 120 mL 0  . clindamycin (CLEOCIN) 300 MG capsule Take 1 capsule (300 mg total) by mouth 3 (three) times daily. 21 capsule 0  . hydrALAZINE (APRESOLINE) 10 MG tablet Take 1 tablet (10 mg total) by mouth 3 (three) times daily. 90 tablet 1  . hydrochlorothiazide (HYDRODIURIL) 25 MG tablet Take 1 tablet (25 mg total) by mouth daily. 30 tablet 5  . lisinopril (PRINIVIL,ZESTRIL) 40 MG tablet Take 1 tablet (40 mg total) by mouth daily. 30 tablet 5  . methocarbamol (ROBAXIN) 750 MG tablet Take 1 tablet (750 mg total) by mouth 4 (four) times daily. Prn muscle spasm/pain 90 tablet 1   No facility-administered medications prior to visit.    ROS Review of Systems  Constitutional: Positive for activity change. Negative for fever, chills, fatigue and unexpected weight change.  HENT: Positive for dental problem.   Eyes: Negative for visual disturbance.  Respiratory: Negative for cough and shortness of breath.   Cardiovascular: Positive for chest pain. Negative for palpitations and leg swelling.  Gastrointestinal: Negative for nausea,  vomiting, abdominal pain, diarrhea, constipation and blood in stool.  Endocrine: Negative for polydipsia, polyphagia and polyuria.  Musculoskeletal: Positive for arthralgias. Negative for myalgias, back pain, gait problem and neck pain.  Skin: Negative for rash.  Allergic/Immunologic: Negative for immunocompromised state.  Neurological: Positive for light-headedness and headaches.  Hematological: Negative for adenopathy. Does not bruise/bleed easily.  Psychiatric/Behavioral: Negative for suicidal ideas, sleep disturbance and dysphoric mood. The patient is not nervous/anxious.     Objective:  BP 153/106 mmHg  Pulse 79  Temp(Src) 98.4 F (36.9 C) (Oral)  Resp 18  Ht 6' 2.5" (1.892 m)  Wt 213 lb (96.616 kg)  BMI 26.99 kg/m2  SpO2 100%  BP/Weight 07/07/2015 123XX123 [id number]  Systolic BP 0000000 Q000111Q 123456  Diastolic BP A999333 A999333 123XX123  Wt. (Lbs) 213 220 213.8  BMI 26.99 28.23 32.52    Physical Exam  Constitutional: He appears well-developed and well-nourished. No distress.  HENT:  Head: Normocephalic and atraumatic.  Mouth/Throat: Abnormal dentition. Dental caries present. No dental abscesses.  Many carries Many broken teeth   Neck: Normal range of motion. Neck supple.  Cardiovascular: Normal rate, regular rhythm, normal heart sounds and intact distal pulses.   Pulmonary/Chest: Effort normal and breath sounds normal.  Musculoskeletal: He exhibits no edema.       Left wrist: He exhibits normal range of motion, no tenderness, no bony tenderness and no swelling.  Neurological: He is alert.  Skin: Skin is warm and dry. No rash noted.  No erythema.     Psychiatric: He has a normal mood and affect.    Assessment & Plan:   Follow-up: No Follow-up on file.   Boykin Nearing MD

## 2015-07-08 ENCOUNTER — Encounter: Payer: Self-pay | Admitting: Clinical

## 2015-07-08 NOTE — Progress Notes (Signed)
Depression screen [facility] 2/9 07/07/2015 05/27/2015 05/12/2015 05/12/2015  Decreased Interest 0 0 0 0  Down, Depressed, Hopeless 1 0 0 0  PHQ - 2 Score 1 0 0 0  Altered sleeping 0 - - -  Tired, decreased energy 0 - - -  Change in appetite 0 - - -  Feeling bad or failure about yourself  1 - - -  Trouble concentrating 0 - - -  Moving slowly or fidgety/restless 0 - - -  Suicidal thoughts 0 - - -  PHQ-9 Score 2 - - -    GAD 7 : Generalized Anxiety Score 07/07/2015 05/27/2015  Nervous, Anxious, on Edge 0 0  Control/stop worrying 1 0  Worry too much - different things 1 0  Trouble relaxing 0 0  Restless 0 0  Easily annoyed or irritable 1 0  Afraid - awful might happen 0 0  Total GAD 7 Score 3 0

## 2015-07-25 IMAGING — CR DG WRIST COMPLETE 3+V*L*
4 series · 4 of 4 positions shown · non-contrast
Comparison: None.

CLINICAL DATA: Wrist pain for 2 weeks, possible job related injury.

EXAM:
LEFT WRIST - COMPLETE 3+ VIEW

[x wrist pa left]
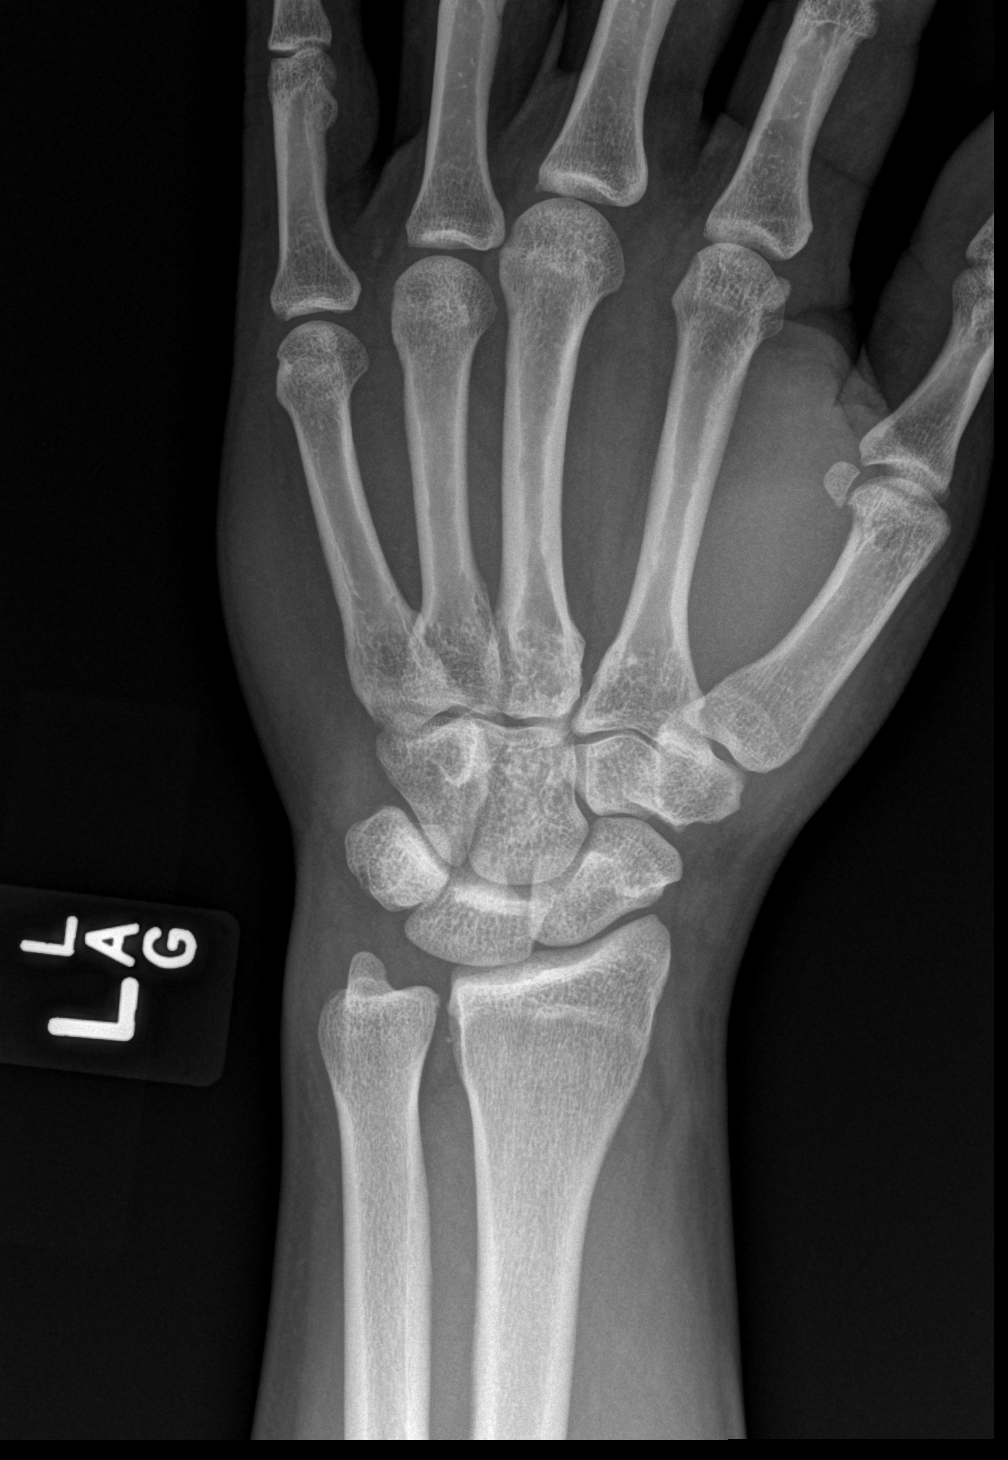

[x wrist obl left]
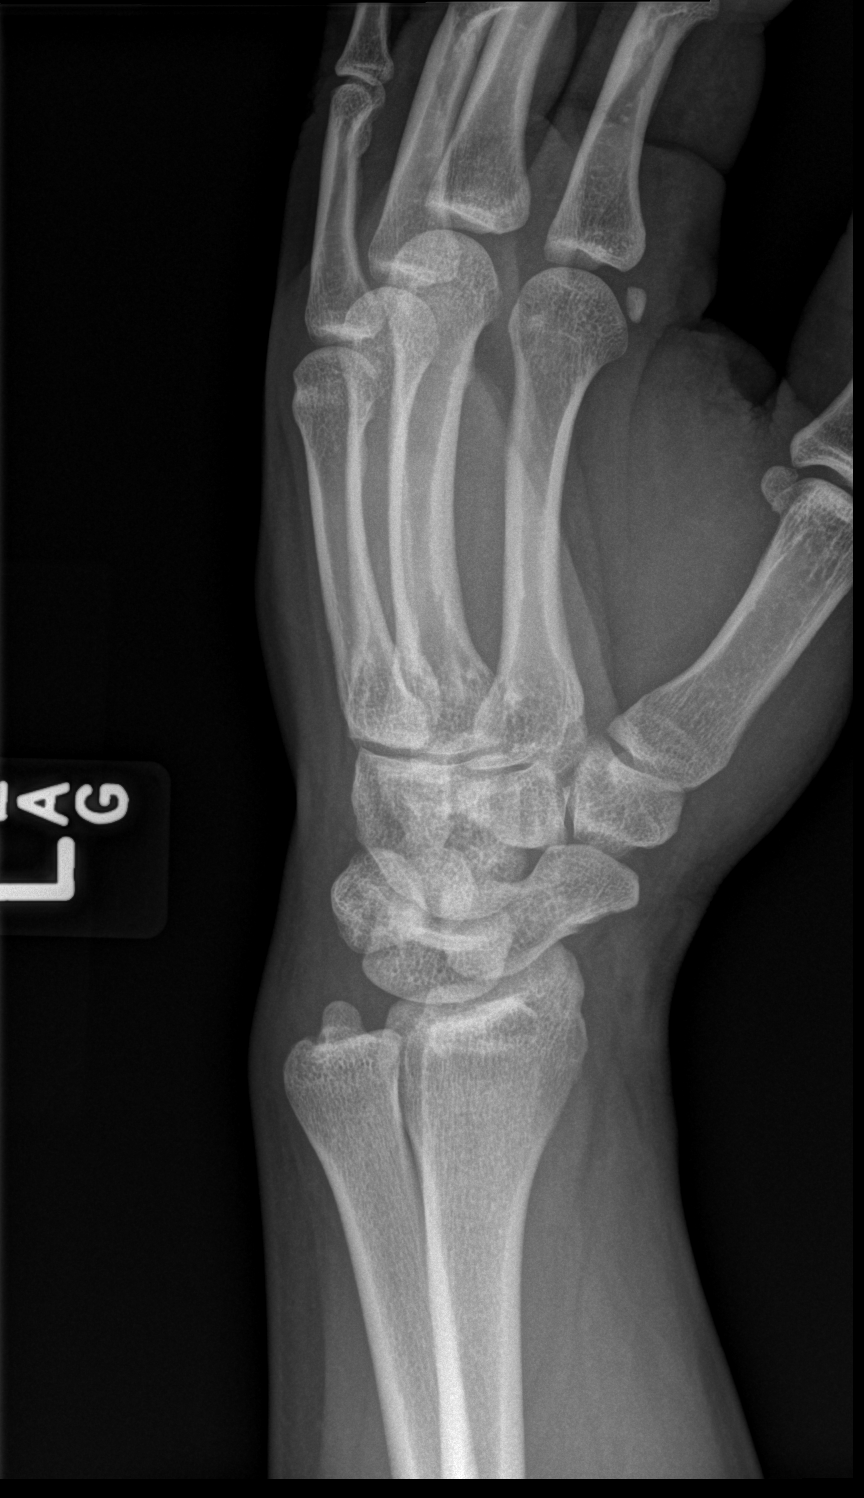

[x wrist lat left]
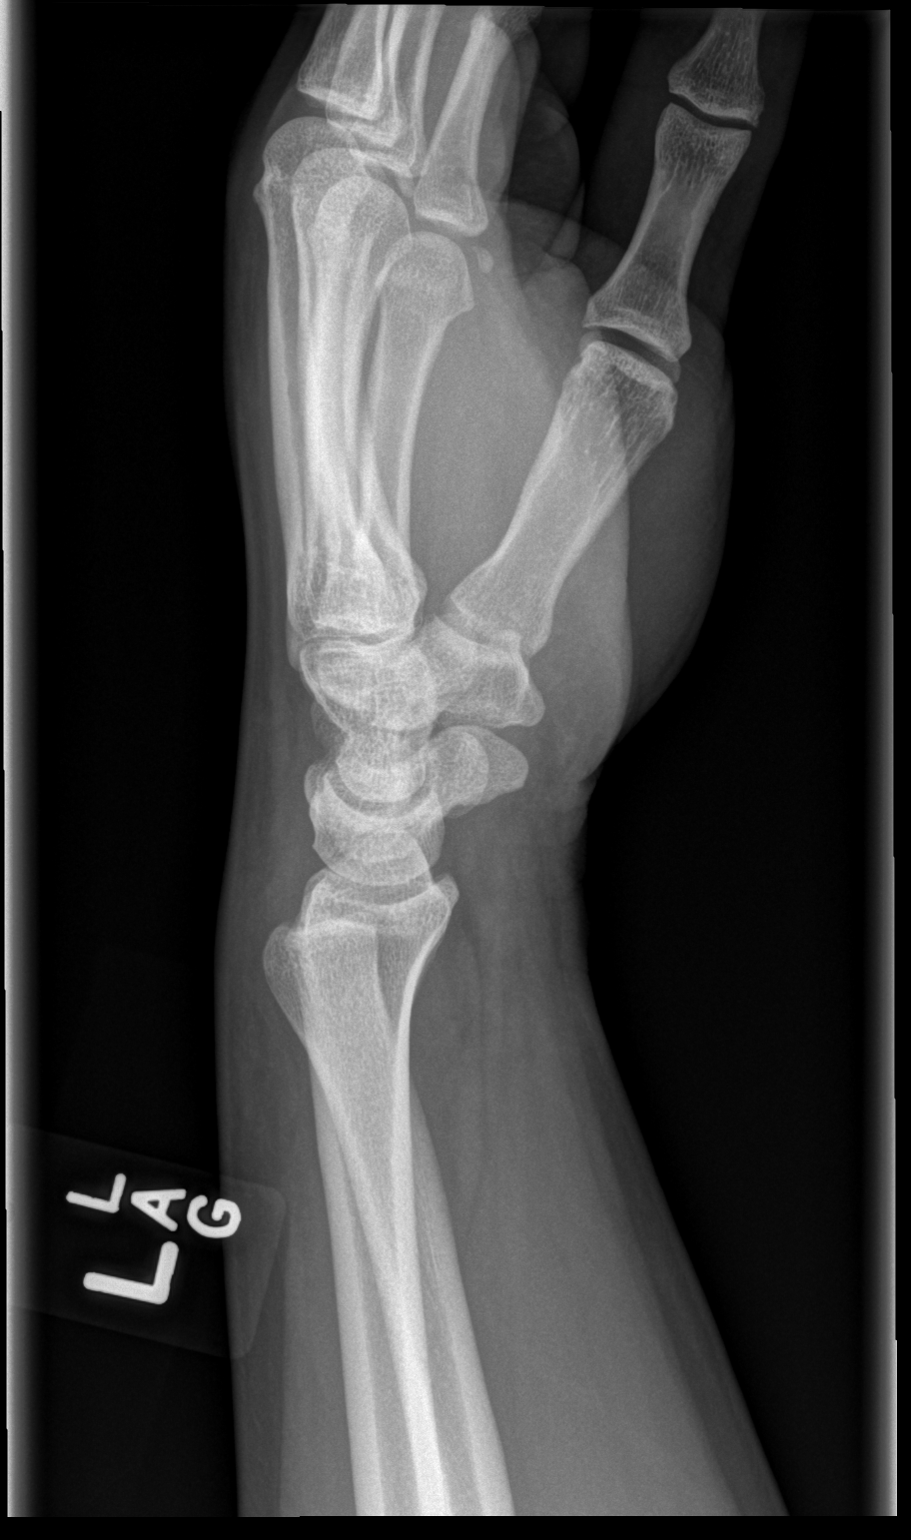

[x wrist navicular view left]
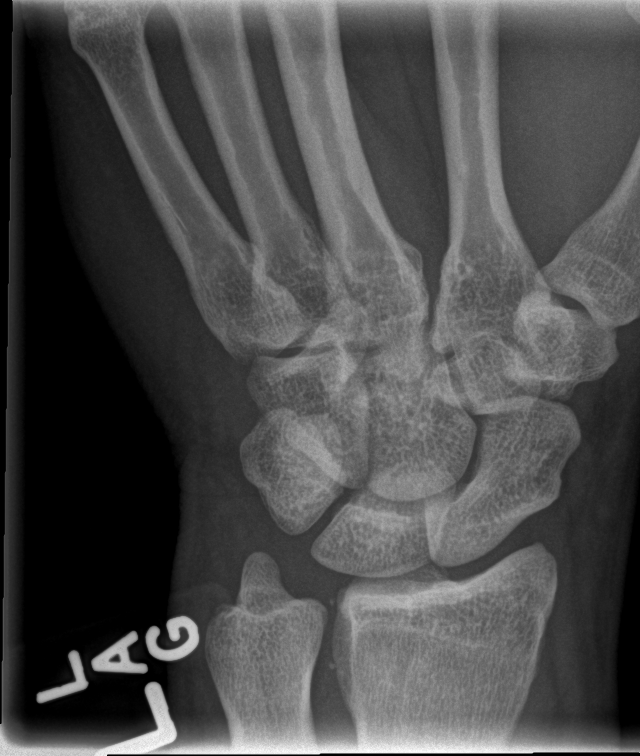

[4 of 4 positions shown; findings below may reference images not displayed]

FINDINGS: No acute fracture deformity or dislocation. Mild widening of the
radial ulnar joint space. Joint space intact without erosions. No
destructive bony lesions. Soft tissue planes are not suspicious.
IMPRESSION: Slight widening of the radial ulnar joint space suggest remote
injury without acute fracture deformity or dislocation.

By: Tc Hamide Muyan

## 2015-07-28 ENCOUNTER — Ambulatory Visit: Payer: Self-pay | Admitting: Family Medicine

## 2015-10-19 ENCOUNTER — Emergency Department (HOSPITAL_COMMUNITY)
Admission: EM | Admit: 2015-10-19 | Discharge: 2015-10-20 | Disposition: A | Discharge status: Home / Self Care | Payer: Self-pay | Attending: Emergency Medicine | Admitting: Emergency Medicine

## 2015-10-19 ENCOUNTER — Encounter (HOSPITAL_COMMUNITY): Payer: Self-pay | Admitting: *Deleted

## 2015-10-19 DIAGNOSIS — Z87891 Personal history of nicotine dependence: Secondary | ICD-10-CM | POA: Insufficient documentation

## 2015-10-19 DIAGNOSIS — Z79899 Other long term (current) drug therapy: Secondary | ICD-10-CM | POA: Insufficient documentation

## 2015-10-19 DIAGNOSIS — I10 Essential (primary) hypertension: Secondary | ICD-10-CM | POA: Insufficient documentation

## 2015-10-19 LAB — CBC WITH DIFFERENTIAL/PLATELET
BASOS PCT: 1 %
Basophils Absolute: 0 10*3/uL (ref 0.0–0.1)
EOS ABS: 0.1 10*3/uL (ref 0.0–0.7)
Eosinophils Relative: 1 %
HCT: 45.5 % (ref 39.0–52.0)
HEMOGLOBIN: 16.4 g/dL (ref 13.0–17.0)
Lymphocytes Relative: 41 %
Lymphs Abs: 3 10*3/uL (ref 0.7–4.0)
MCH: 30.8 pg (ref 26.0–34.0)
MCHC: 36 g/dL (ref 30.0–36.0)
MCV: 85.5 fL (ref 78.0–100.0)
Monocytes Absolute: 0.5 10*3/uL (ref 0.1–1.0)
Monocytes Relative: 7 %
NEUTROS PCT: 50 %
Neutro Abs: 3.7 10*3/uL (ref 1.7–7.7)
Platelets: 246 10*3/uL (ref 150–400)
RBC: 5.32 MIL/uL (ref 4.22–5.81)
RDW: 13.6 % (ref 11.5–15.5)
WBC: 7.4 10*3/uL (ref 4.0–10.5)

## 2015-10-19 LAB — BASIC METABOLIC PANEL
Anion gap: 6 (ref 5–15)
BUN: 9 mg/dL (ref 6–20)
CALCIUM: 8.8 mg/dL — AB (ref 8.9–10.3)
CHLORIDE: 106 mmol/L (ref 101–111)
CO2: 26 mmol/L (ref 22–32)
CREATININE: 0.98 mg/dL (ref 0.61–1.24)
GFR calc non Af Amer: >60 mL/min (ref >60)
Glucose, Bld: 103 mg/dL — ABNORMAL HIGH (ref 65–99)
Potassium: 3.6 mmol/L (ref 3.5–5.1)
SODIUM: 138 mmol/L (ref 135–145)

## 2015-10-19 MED ORDER — LISINOPRIL 40 MG PO TABS: 40.0000 mg | ORAL_TABLET | Freq: Every day | ORAL | 1 refills | Status: DC

## 2015-10-19 MED ORDER — AMLODIPINE BESYLATE 10 MG PO TABS: 10.0000 mg | ORAL_TABLET | Freq: Every day | ORAL | 1 refills | Status: DC

## 2015-10-19 MED ORDER — CLONIDINE HCL 0.1 MG PO TABS
0.2000 mg | ORAL_TABLET | Freq: Once | ORAL | Status: AC
  Administered 2015-10-19: 0.2 mg via ORAL
  Filled 2015-10-19: qty 2

## 2015-10-19 MED ORDER — HYDROCHLOROTHIAZIDE 25 MG PO TABS: 25.0000 mg | ORAL_TABLET | Freq: Every day | ORAL | 1 refills | Status: DC

## 2015-10-19 MED ORDER — LABETALOL HCL 100 MG PO TABS
100.0000 mg | ORAL_TABLET | Freq: Once | ORAL | Status: AC
  Administered 2015-10-19: 100 mg via ORAL
  Filled 2015-10-19: qty 1

## 2015-10-19 MED ORDER — LABETALOL HCL 200 MG PO TABS: 200.0000 mg | ORAL_TABLET | Freq: Once | ORAL | Status: DC

## 2015-10-19 NOTE — ED Triage Notes (Signed)
Pt states that he has been out of BO meds x 3 days; pt states that he cannot get any more medications til Fri because he doesn't get paid til Thurs; pt c/o HA and not feeling well since yesterday; pt denies chest pain; pt states that his legs hurt and he feels dizzy at times

## 2015-10-19 NOTE — ED Provider Notes (Signed)
[facility] DEPT Provider Note   CSN: [id number] Arrival date & time: 10/19/15  2014  First Provider Contact:  None       History   Chief Complaint Chief Complaint  Patient presents with  . Hypertension    HPI [name] is a 40 y.o. male.  Patient is a 40 year old male with history of hypertension. He presents for evaluation of elevated blood pressure. He reports running out of his blood pressure medications approximately 3 days ago. He felt dizzy and unwell this evening prior to going to work and came here instead. He denies any chest pain or difficulty breathing. He denies any fevers or chills.   The history is provided by the patient.  Hypertension  This is a new problem. The current episode started 2 days ago. The problem occurs constantly. The problem has been gradually worsening. Pertinent negatives include no chest pain, no headaches and no shortness of breath. Nothing aggravates the symptoms. Nothing relieves the symptoms. He has tried nothing for the symptoms. The treatment provided no relief.    Past Medical History:  Diagnosis Date  . Hypertension 2003   dx age 47   . Wears glasses     Patient Active Problem List   Diagnosis Date Noted  . Folliculitis barbae 0000000  . Poor dentition 05/27/2015  . Periodontitis 05/27/2015  . Pain in left wrist 05/27/2015  . HTN (hypertension) 05/12/2015    Past Surgical History:  Procedure Laterality Date  . CHOLECYSTECTOMY         Home Medications    Prior to Admission medications   Medication Sig Start Date End Date Taking? Authorizing Provider  amLODipine (NORVASC) 10 MG tablet Take 1 tablet (10 mg total) by mouth daily. 07/07/15  Yes Josalyn Funches, MD  hydrochlorothiazide (HYDRODIURIL) 25 MG tablet Take 1 tablet (25 mg total) by mouth daily. 07/07/15  Yes Josalyn Funches, MD  lisinopril (PRINIVIL,ZESTRIL) 40 MG tablet Take 1 tablet (40 mg total) by mouth daily. 07/07/15  Yes Josalyn Funches, MD    clindamycin (CLINDAGEL) 1 % gel Apply topically 2 (two) times daily. Patient not taking: Reported on 10/19/2015 07/07/15   Boykin Nearing, MD  hydrALAZINE (APRESOLINE) 10 MG tablet Take 1 tablet (10 mg total) by mouth 3 (three) times daily. Patient not taking: Reported on 10/19/2015 07/07/15   Boykin Nearing, MD    Family History Family History  Problem Relation Age of Onset  . Cancer Mother     Social History Social History  Substance Use Topics  . Smoking status: Former Smoker    Packs/day: 1.00    Types: Cigars    Quit date: 05/10/2015  . Smokeless tobacco: Never Used  . Alcohol use Yes     Comment: occ     Allergies   Penicillins   Review of Systems Review of Systems  Respiratory: Negative for shortness of breath.   Cardiovascular: Negative for chest pain.  Neurological: Negative for headaches.  All other systems reviewed and are negative.    Physical Exam Updated Vital Signs BP (!) 196/123   Pulse 82   Temp 98.6 F (37 C) (Oral)   Resp 18   Ht 6' (1.829 m)   Wt 220 lb (99.8 kg)   SpO2 96%   BMI 29.84 kg/m   Physical Exam  Constitutional: He is oriented to person, place, and time. He appears well-developed and well-nourished. No distress.  HENT:  Head: Normocephalic and atraumatic.  Mouth/Throat: Oropharynx is clear and moist.  Eyes:  EOM are normal. Pupils are equal, round, and reactive to light.  Neck: Normal range of motion. Neck supple.  Cardiovascular: Normal rate and regular rhythm.  Exam reveals no friction rub.   No murmur heard. Pulmonary/Chest: Effort normal and breath sounds normal. No respiratory distress. He has no wheezes. He has no rales.  Abdominal: Soft. Bowel sounds are normal. He exhibits no distension. There is no tenderness.  Musculoskeletal: Normal range of motion. He exhibits no edema.  Neurological: He is alert and oriented to person, place, and time. No cranial nerve deficit. Coordination normal.  Skin: Skin is warm and dry. He  is not diaphoretic.  Nursing note and vitals reviewed.    ED Treatments / Results  Labs (all labs ordered are listed, but only abnormal results are displayed) Labs Reviewed  BASIC METABOLIC PANEL  CBC WITH DIFFERENTIAL/PLATELET    EKG  EKG Interpretation None       Radiology No results found.  Procedures Procedures (including critical care time)  Medications Ordered in ED Medications  cloNIDine (CATAPRES) tablet 0.2 mg (not administered)     Initial Impression / Assessment and Plan / ED Course  I have reviewed the triage vital signs and the nursing notes.  Pertinent labs & imaging results that were available during my care of the patient were reviewed by me and considered in my medical decision making (see chart for details).  Clinical Course    Final Clinical Impressions(s) / ED Diagnoses    Final diagnoses:  None   Patient presents with complaints of elevated blood pressure. He is given a dose of clonidine with little improvement. He will now be given a dose of labetalol it started back on his home medications. Laboratory studies are unremarkable and his neurologic exam is nonfocal. He is to resume his home medications and keep record of his blood pressures, then follow-up with his primary doctor.  New Prescriptions New Prescriptions   No medications on file     Veryl Speak, MD 10/19/15 2307

## 2015-10-19 NOTE — ED Notes (Signed)
Pt calling a ride.

## 2015-10-19 NOTE — Discharge Instructions (Signed)
Begin taking the medications prescribed for you today.  Keep a record of your blood pressure over the next several days so that he can take this with you to your next doctor's appointment.  Return to the ER if you develop any new and concerning symptoms.

## 2015-11-10 ENCOUNTER — Ambulatory Visit: Payer: Self-pay | Attending: Family Medicine | Admitting: Family Medicine

## 2015-11-10 ENCOUNTER — Encounter: Payer: Self-pay | Admitting: Family Medicine

## 2015-11-10 VITALS — BP 211/147 | HR 75 | Temp 98.1°F | Ht 74.5 in | Wt 211.0 lb

## 2015-11-10 DIAGNOSIS — I10 Essential (primary) hypertension: Secondary | ICD-10-CM | POA: Insufficient documentation

## 2015-11-10 DIAGNOSIS — Z79899 Other long term (current) drug therapy: Secondary | ICD-10-CM | POA: Insufficient documentation

## 2015-11-10 DIAGNOSIS — R079 Chest pain, unspecified: Secondary | ICD-10-CM | POA: Insufficient documentation

## 2015-11-10 MED ORDER — CLONIDINE HCL 0.1 MG PO TABS
0.2000 mg | ORAL_TABLET | Freq: Once | ORAL | Status: AC
  Administered 2015-11-10: 0.2 mg via ORAL

## 2015-11-10 MED ORDER — CLONIDINE HCL 0.1 MG PO TABS: 0.1000 mg | ORAL_TABLET | Freq: Three times a day (TID) | ORAL | 3 refills | Status: DC

## 2015-11-10 MED ORDER — AMLODIPINE BESYLATE 10 MG PO TABS: 10.0000 mg | ORAL_TABLET | Freq: Every day | ORAL | 5 refills | Status: DC

## 2015-11-10 MED ORDER — LISINOPRIL 40 MG PO TABS: 40.0000 mg | ORAL_TABLET | Freq: Every day | ORAL | 5 refills | Status: DC

## 2015-11-10 MED ORDER — CLONIDINE HCL 0.2 MG PO TABS: 0.2000 mg | ORAL_TABLET | Freq: Three times a day (TID) | ORAL | 2 refills | Status: DC

## 2015-11-10 MED ORDER — HYDROCHLOROTHIAZIDE 25 MG PO TABS: 25.0000 mg | ORAL_TABLET | Freq: Every day | ORAL | 5 refills | Status: DC

## 2015-11-10 NOTE — Addendum Note (Signed)
Addended by: Boykin Nearing on: 11/10/2015 04:44 PM   Modules accepted: Orders

## 2015-11-10 NOTE — Progress Notes (Signed)
Subjective:  Patient ID: [name], male    DOB: 04-May-1975  Age: 40 y.o. MRN: [id number]  CC: Hypertension   HPI [name] has HTN since 2003 he presents for   1. CHRONIC HYPERTENSION  Disease Monitoring  Blood pressure range: not checking   Chest pain: yes, L sided, with deep inspiration, he has hx of L sided CP and cardiomegaly   Dyspnea: no   Claudication: no   Medication compliance: no, not taking hydralazine due to fatigue  Medication Side Effects  Lightheadedness: yes   Urinary frequency: no   Edema: no   2. L sided pains: pain in stiffness on L side of body and upper back. 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 No weakness, numbness, vision changes, slurred speech. He works for [employer]. He does a lot of heavy lifting 50-70# during his shift. He works 3rd shift. No injuries. He sleeps 6-7 hrs a day.   Past Medical History:  Diagnosis Date  . Hypertension 2003   dx age 80   . Wears glasses    Outpatient Medications Prior to Visit  Medication Sig Dispense Refill  . amLODipine (NORVASC) 10 MG tablet Take 1 tablet (10 mg total) by mouth daily. 30 tablet 1  . hydrochlorothiazide (HYDRODIURIL) 25 MG tablet Take 1 tablet (25 mg total) by mouth daily. 30 tablet 1  . lisinopril (PRINIVIL,ZESTRIL) 40 MG tablet Take 1 tablet (40 mg total) by mouth daily. 30 tablet 1  . clindamycin (CLINDAGEL) 1 % gel Apply topically 2 (two) times daily. (Patient not taking: Reported on 10/19/2015) 30 g 0  . hydrALAZINE (APRESOLINE) 10 MG tablet Take 1 tablet (10 mg total) by mouth 3 (three) times daily. (Patient not taking: Reported on 10/19/2015) 90 tablet 5   No facility-administered medications prior to visit.     ROS Review of Systems  Constitutional: Positive for activity change. Negative for chills, fatigue, fever and unexpected weight change.  HENT: Positive for dental problem.   Eyes: Negative for visual disturbance.  Respiratory: Negative for cough and  shortness of breath.   Cardiovascular: Positive for chest pain. Negative for palpitations and leg swelling.  Gastrointestinal: Negative for abdominal pain, blood in stool, constipation, diarrhea, nausea and vomiting.  Endocrine: Negative for polydipsia, polyphagia and polyuria.  Musculoskeletal: Positive for arthralgias. Negative for back pain, gait problem, myalgias and neck pain.  Skin: Negative for rash.  Allergic/Immunologic: Negative for immunocompromised state.  Neurological: Positive for light-headedness and headaches.  Hematological: Negative for adenopathy. Does not bruise/bleed easily.  Psychiatric/Behavioral: Negative for dysphoric mood, sleep disturbance and suicidal ideas. The patient is not nervous/anxious.     Objective:  BP (!) 198/145 (BP Location: Right Arm)   Pulse 75   Temp 98.1 F (36.7 C) (Oral)   Ht 6' 2.5" (1.892 m)   Wt 211 lb (95.7 kg)   SpO2 98%   BMI 26.73 kg/m   BP/Weight 11/10/2015 10/19/2015 [id number]  Systolic BP 99991111 [id number] 0000000  Diastolic BP [id number] 0000000 [id number]  Wt. (Lbs) 211 220 213  BMI 26.73 29.84 26.99    Physical Exam  Constitutional: He is oriented to person, place, and time. He appears well-developed and well-nourished. No distress.  HENT:  Head: Normocephalic and atraumatic.  Mouth/Throat: Abnormal dentition. Dental caries present. No dental abscesses.  Many carries Many broken teeth   Eyes: Conjunctivae and EOM are normal. Pupils are equal, round, and reactive to light.  Neck: Normal range of motion. Neck supple.  Cardiovascular: Normal rate, regular rhythm, normal heart sounds and intact distal pulses.   Pulmonary/Chest: Effort normal and breath sounds normal.  Musculoskeletal: He exhibits no edema.       Left wrist: He exhibits normal range of motion, no tenderness, no bony tenderness and no swelling.  Neurological: He is alert and oriented to person, place, and time. He has normal reflexes. No cranial nerve deficit. He exhibits normal muscle  tone. Coordination normal.  Skin: Skin is warm and dry. No rash noted. No erythema.     Psychiatric: He has a normal mood and affect.   Elevated BP  198/145 treated with clonidine 0.2 mg x one  repeat BP 211/147    Assessment & Plan:  Dowell was seen today for hypertension.  Diagnoses and all orders for this visit:  Left sided chest pain -     DG Chest 2 View; Future  Essential hypertension -     lisinopril (PRINIVIL,ZESTRIL) 40 MG tablet; Take 1 tablet (40 mg total) by mouth daily. -     hydrochlorothiazide (HYDRODIURIL) 25 MG tablet; Take 1 tablet (25 mg total) by mouth daily. -     amLODipine (NORVASC) 10 MG tablet; Take 1 tablet (10 mg total) by mouth daily. -     cloNIDine (CATAPRES) 0.1 MG tablet; Take 1 tablet (0.1 mg total) by mouth 3 (three) times daily. -     cloNIDine (CATAPRES) tablet 0.2 mg; Take 2 tablets (0.2 mg total) by mouth once.   Follow-up: No Follow-up on file.   [name] Nearing MD

## 2015-11-10 NOTE — Assessment & Plan Note (Addendum)
A: severe HTN Med: compliant with all except hydralazine P: Trial back on clonidine 0.2 mg TID With instructions to go to BID if fatigue with TID dosing Plan for coreg if intolerant of clonidine Close f/u in two weeks Reviewed s/s to prompt patient to go to ED

## 2015-11-10 NOTE — Assessment & Plan Note (Signed)
L sided chest pain with deep inspiration No fever, chills, cough. Pattern is not consistent with angina. Patient has known cardiomegaly and has hx of L sided CP  Plan: DG chest BP control

## 2015-11-10 NOTE — Patient Instructions (Addendum)
Bardo was seen today for hypertension.  Diagnoses and all orders for this visit:  Left sided chest pain -     DG Chest 2 View; Future  Essential hypertension -     lisinopril (PRINIVIL,ZESTRIL) 40 MG tablet; Take 1 tablet (40 mg total) by mouth daily. -     hydrochlorothiazide (HYDRODIURIL) 25 MG tablet; Take 1 tablet (25 mg total) by mouth daily. -     amLODipine (NORVASC) 10 MG tablet; Take 1 tablet (10 mg total) by mouth daily. -     Discontinue: cloNIDine (CATAPRES) 0.1 MG tablet; Take 1 tablet (0.1 mg total) by mouth 3 (three) times daily. -     cloNIDine (CATAPRES) tablet 0.2 mg; Take 2 tablets (0.2 mg total) by mouth once. -     cloNIDine (CATAPRES) 0.2 MG tablet; Take 1 tablet (0.2 mg total) by mouth 3 (three) times daily.  go back to ED if you develop severe CP, HA, SOB or weakness on one side of body, slurred speech, vision changes  F/u in 2 weeks for HTN, BP check with me or with Erline Levine whoever available   Dr. Adrian Blackwater

## 2015-11-22 MED FILL — ?CLONIDINE HCL 0.2 MG TABLE: 0.2 | 30 days supply | Qty: 90 | Fill #0

## 2015-11-22 MED FILL — HYDROCHLOROTHIAZIDE 25 MG T: 25 | 30 days supply | Qty: 30 | Fill #0

## 2015-11-22 MED FILL — LISINOPRIL 40 MG TABLET: 40 | 30 days supply | Qty: 30 | Fill #0

## 2015-11-23 MED FILL — AMLODIPINE BESYLATE 10 MG T: 10 | 30 days supply | Qty: 30 | Fill #0

## 2015-11-24 ENCOUNTER — Encounter: Payer: Self-pay | Admitting: Pharmacist

## 2015-11-24 ENCOUNTER — Ambulatory Visit: Payer: Self-pay | Attending: Family Medicine | Admitting: Pharmacist

## 2015-11-24 VITALS — BP 157/105 | HR 73

## 2015-11-24 DIAGNOSIS — I10 Essential (primary) hypertension: Secondary | ICD-10-CM

## 2015-11-24 MED ORDER — SPIRONOLACTONE 25 MG PO TABS: 12.5000 mg | ORAL_TABLET | Freq: Every day | ORAL | 2 refills | Status: DC

## 2015-11-24 NOTE — Progress Notes (Signed)
Patient arrives in good spirits, ambulating without assistance.  He presents to the clinic for hypertension evaluation.  Diagnosed with Hypertension at age 40.    Patient reports adherence with medications. Pt reports he works overnight at [employer]. Typically wakes up around 12pm. Pt reports he gets ~6 hours of sleep/night and takes all of his medications upon waking. Pt states that clonidine makes him feel a little drowsy.   Current BP Medications include:  Lisinopril 40 mg daily, amlodipine 10 mg daily, clonidine 0.2 mg TID (new start, took his first dose today), HCTZ 25 mg daily  Antihypertensives tried in the past include: metoprolol, diltiazem, clonidine previously   O:   Last 3 Office BP readings: BP Readings from Last 3 Encounters:  11/24/15 (!) 157/105  11/10/15 (!) 211/147  10/19/15 (!) 165/109    BMET    Component Value Date/Time   NA 138 10/19/2015 2151   K 3.6 10/19/2015 2151   CL 106 10/19/2015 2151   CO2 26 10/19/2015 2151   GLUCOSE 103 (H) 10/19/2015 2151   BUN 9 10/19/2015 2151   CREATININE 0.98 10/19/2015 2151   CALCIUM 8.8 (L) 10/19/2015 2151   GFRNONAA >60 10/19/2015 2151   GFRAA >60 10/19/2015 2151    A/P:  History of hypertension since age of 76 which currently is not controlled on current medications as evidenced by BP in clinic. Change dosing of lisinopril 40 mg to bedtime dosing. Add spironolactone 12.5 mg daily. Counseled on use and potential side effects of hypotension and gynecomastia. Asked patient to call us if experiencing intolerable side effects before return visit. Continue other antihypertensives.   Results reviewed and written information provided.   F/U Clinic Visit with Dr. Peterson Ao in 1 week. Total time in face-to-face counseling 20 minutes.  Patient seen with Christean Leaf, PharmD Candidate.

## 2015-11-24 NOTE — Patient Instructions (Signed)
Thanks for coming to see [place]!  Take lisinopril at your bedtime  Start spironolactone 12.5 mg daily  Come back in 1 week

## 2015-11-28 ENCOUNTER — Emergency Department (HOSPITAL_COMMUNITY)
Admission: EM | Admit: 2015-11-28 | Discharge: 2015-11-28 | Disposition: A | Discharge status: Home / Self Care | Payer: Self-pay | Attending: Dermatology | Admitting: Dermatology

## 2015-11-28 DIAGNOSIS — Z87891 Personal history of nicotine dependence: Secondary | ICD-10-CM | POA: Insufficient documentation

## 2015-11-28 DIAGNOSIS — Z79899 Other long term (current) drug therapy: Secondary | ICD-10-CM | POA: Insufficient documentation

## 2015-11-28 DIAGNOSIS — K0889 Other specified disorders of teeth and supporting structures: Secondary | ICD-10-CM | POA: Insufficient documentation

## 2015-11-28 DIAGNOSIS — Z5321 Procedure and treatment not carried out due to patient leaving prior to being seen by health care provider: Secondary | ICD-10-CM | POA: Insufficient documentation

## 2015-11-28 DIAGNOSIS — I10 Essential (primary) hypertension: Secondary | ICD-10-CM | POA: Insufficient documentation

## 2015-11-28 NOTE — ED Notes (Signed)
Pt called twice for triage, no response.

## 2015-11-29 MED FILL — CLINDAMYCIN HCL 150 MG CAPS: 150 | 7 days supply | Qty: 28 | Fill #0

## 2015-12-01 ENCOUNTER — Ambulatory Visit: Payer: Self-pay | Attending: Family Medicine | Admitting: Pharmacist

## 2015-12-01 ENCOUNTER — Encounter: Payer: Self-pay | Admitting: Pharmacist

## 2015-12-01 VITALS — BP 163/117 | HR 70

## 2015-12-01 DIAGNOSIS — I10 Essential (primary) hypertension: Secondary | ICD-10-CM

## 2015-12-01 NOTE — Progress Notes (Signed)
Patient arrives in poor spirits, ambulating without assistance.  He presents to the clinic for hypertension evaluation. Diagnosed with Hypertension at age 40.  Patient reports he went to the ED for dental pain since our last visit. He was given Rx for clindamycin and told to treat pain with acetaminophen and ibuprofen. Pt expresses frustration with ED physicians as his pain has not been controlled on current OTC pain meds. Additionally, pt expresses frustration with pharmacy clinic as he does not understand why he was sent to [place] from Dr. Adrian Blackwater. Patient verbalized understanding of pharmacy clinic's relationship with his primary care provider after explanation.   Patient denies adherence with clonidine (only taking once daily as he cannot tolerate feeling "out of it" at work) and spironolactone (has not picked up Rx due to mistrust in pharmacy clinic upon initial visit and financial limitation). Pt endorses compliance with other medications.   Current BP Medications include:  Lisinopril 40 mg daily, amlodipine 10 mg daily, clonidine 0.2 mg TID (taking once daily), HCTZ 25 mg daily, spironolactone 12.5 mg daily (not taking yet).   Antihypertensives tried in the past include: metoprolol, diltiazem, clonidine previously   O:  Vitals:   12/01/15 1624  BP: (!) 163/117  Pulse: 70   Last 3 Office BP readings: BP Readings from Last 3 Encounters:  11/24/15 (!) 157/105  11/10/15 (!) 211/147  10/19/15 (!) 165/109    BMET    Component Value Date/Time   NA 138 10/19/2015 2151   K 3.6 10/19/2015 2151   CL 106 10/19/2015 2151   CO2 26 10/19/2015 2151   GLUCOSE 103 (H) 10/19/2015 2151   BUN 9 10/19/2015 2151   CREATININE 0.98 10/19/2015 2151   CALCIUM 8.8 (L) 10/19/2015 2151   GFRNONAA >60 10/19/2015 2151   GFRAA >60 10/19/2015 2151    A/P:  History of hypertension since age of 16 which currently is not controlled on current medications as evidenced by BP in clinic, worsened by noncompliance  and pain. Continue lisinopril 40 mg with bedtime dosing. Add spironolactone 12.5 mg daily.  Asked patient to call us if experiencing intolerable side effects before return visit. Continue other antihypertensives.   BMET needed at next visit if taking spironolactone  Results reviewed and written information provided.   F/U Clinic Visit with Dr. Peterson Ao or Dr. Adrian Blackwater in 1 week, given dental pain a contributing factor to increased BP. Total time in face-to-face counseling 20 minutes.  Patient seen with Christean Leaf, PharmD Candidate.

## 2015-12-01 NOTE — Progress Notes (Signed)
I agree with the resident's note as documented. Patient to follow up with Dr. Adrian Blackwater.

## 2015-12-01 NOTE — Patient Instructions (Signed)
Thank you for coming to see [place].   Please start taking your spironolactone once/day as prescribed. Take either lisinopril or amlodipine before you go to sleep.  Make an appointment to come back and see either [place] or Dr. Adrian Blackwater in 1 week. We want to see what your blood pressure is on spironolactone.

## 2015-12-12 ENCOUNTER — Ambulatory Visit: Payer: BLUE CROSS/BLUE SHIELD | Attending: Family Medicine | Admitting: Family Medicine

## 2015-12-12 ENCOUNTER — Encounter: Payer: Self-pay | Admitting: Family Medicine

## 2015-12-12 VITALS — BP 167/101 | HR 78 | Temp 97.9°F | Ht 74.5 in | Wt 208.0 lb

## 2015-12-12 DIAGNOSIS — I10 Essential (primary) hypertension: Secondary | ICD-10-CM

## 2015-12-12 DIAGNOSIS — K4090 Unilateral inguinal hernia, without obstruction or gangrene, not specified as recurrent: Secondary | ICD-10-CM | POA: Diagnosis not present

## 2015-12-12 DIAGNOSIS — Z79899 Other long term (current) drug therapy: Secondary | ICD-10-CM | POA: Diagnosis not present

## 2015-12-12 DIAGNOSIS — Z87891 Personal history of nicotine dependence: Secondary | ICD-10-CM | POA: Insufficient documentation

## 2015-12-12 LAB — POCT URINALYSIS DIPSTICK
Bilirubin, UA: NEGATIVE
Blood, UA: NEGATIVE
GLUCOSE UA: NEGATIVE
Ketones, UA: NEGATIVE
LEUKOCYTES UA: NEGATIVE
NITRITE UA: NEGATIVE
SPEC GRAV UA: 1.02
UROBILINOGEN UA: 1
pH, UA: 6.5

## 2015-12-12 LAB — BASIC METABOLIC PANEL WITH GFR
BUN: 17 mg/dL (ref 7–25)
CO2: 30 mmol/L (ref 20–31)
Calcium: 9.5 mg/dL (ref 8.6–10.3)
Chloride: 102 mmol/L (ref 98–110)
Creat: 0.97 mg/dL (ref 0.60–1.35)
GLUCOSE: 94 mg/dL (ref 65–99)
POTASSIUM: 3.5 mmol/L (ref 3.5–5.3)
SODIUM: 140 mmol/L (ref 135–146)

## 2015-12-12 MED ORDER — CARVEDILOL 6.25 MG PO TABS: 6.2500 mg | ORAL_TABLET | Freq: Two times a day (BID) | ORAL | 3 refills | Status: DC

## 2015-12-12 NOTE — Assessment & Plan Note (Addendum)
A: uncontrolled HTN Med: non compliant P: Continue lisinopril 40 mg daily, amlodipine 10 mg daily, HCTZ 25 mg daily,  Add aldactone 12.5 mg daily Add coreg 6.25 mg BID  Titrate up as tolerated in two weeks  Stop clonidine due to excessive fatigue

## 2015-12-12 NOTE — Progress Notes (Signed)
Subjective:  Patient ID: [name], male    DOB: Dec 14, 1975  Age: 40 y.o. MRN: [id number]  CC: Hypertension   HPI [name] presents for   1. CHRONIC HYPERTENSION  Disease Monitoring  Blood pressure range: not checking   Chest pain: no   Dyspnea: no  Claudication: no   Medication compliance: no, taking clonidine 0.1 mg nightly only due to excessive fatigue, has not started aldactone   Medication Side Effects  Lightheadedness: no   Urinary frequency: no   Edema: no   Impotence: no   2. Dental pain: resolved. He went to urgent care and was treated with antibiotics and OTC analgesics. He has poor dentition. Has dental insurance. Will call for an appt.   3. L inguinal bulging: x 7 months. Enlarging. Painless. He denies trauma and straining.   Social History  Substance Use Topics  . Smoking status: Former Smoker    Packs/day: 1.00    Types: Cigars    Quit date: 05/10/2015  . Smokeless tobacco: Never Used  . Alcohol use Yes     Comment: occ    Outpatient Medications Prior to Visit  Medication Sig Dispense Refill  . amLODipine (NORVASC) 10 MG tablet Take 1 tablet (10 mg total) by mouth daily. 30 tablet 5  . cloNIDine (CATAPRES) 0.2 MG tablet Take 1 tablet (0.2 mg total) by mouth 3 (three) times daily. (Patient taking differently: Take 0.2 mg by mouth daily. ) 90 tablet 2  . hydrochlorothiazide (HYDRODIURIL) 25 MG tablet Take 1 tablet (25 mg total) by mouth daily. 30 tablet 5  . lisinopril (PRINIVIL,ZESTRIL) 40 MG tablet Take 1 tablet (40 mg total) by mouth daily. 30 tablet 5  . spironolactone (ALDACTONE) 25 MG tablet Take 0.5 tablets (12.5 mg total) by mouth daily. (Patient not taking: Reported on 12/12/2015) 15 tablet 2   No facility-administered medications prior to visit.     ROS Review of Systems  Constitutional: Negative for chills, fatigue, fever and unexpected weight change.  Eyes: Negative for visual disturbance.  Respiratory: Negative for cough and  shortness of breath.   Cardiovascular: Negative for chest pain, palpitations and leg swelling.  Gastrointestinal: Positive for abdominal distention. Negative for abdominal pain, blood in stool, constipation, diarrhea, nausea and vomiting.  Endocrine: Negative for polydipsia, polyphagia and polyuria.  Musculoskeletal: Negative for arthralgias, back pain, gait problem, myalgias and neck pain.  Skin: Negative for rash.  Allergic/Immunologic: Negative for immunocompromised state.  Hematological: Negative for adenopathy. Does not bruise/bleed easily.  Psychiatric/Behavioral: Negative for dysphoric mood, sleep disturbance and suicidal ideas. The patient is not nervous/anxious.     Objective:  BP (!) 167/101 (BP Location: Right Arm, Patient Position: Sitting, Cuff Size: Small)   Pulse 78   Temp 97.9 F (36.6 C) (Oral)   Ht 6' 2.5" (1.892 m)   Wt 208 lb (94.3 kg)   SpO2 98%   BMI 26.35 kg/m   BP/Weight 12/12/2015 12/01/2015 [id number]  Systolic BP [id number] [id number]  Diastolic BP 99991111 [id number]  Wt. (Lbs) 208 - -  BMI 26.35 - -    Physical Exam  Constitutional: He appears well-developed and well-nourished. No distress.  HENT:  Head: Normocephalic and atraumatic.  Neck: Normal range of motion. Neck supple.  Cardiovascular: Normal rate, regular rhythm, normal heart sounds and intact distal pulses.   Pulmonary/Chest: Effort normal and breath sounds normal.  Abdominal: A hernia is present. Hernia confirmed positive in the left inguinal area.    Musculoskeletal: He  exhibits no edema.       Legs: Neurological: He is alert.  Skin: Skin is warm and dry. No rash noted. No erythema.  Psychiatric: He has a normal mood and affect.   UA: 30 protein, otherwise wnl  Assessment & Plan:  Nolen was seen today for hypertension.  Diagnoses and all orders for this visit:  Essential hypertension -     BASIC METABOLIC PANEL WITH GFR -     carvedilol (COREG) 6.25 MG tablet; Take 1 tablet (6.25 mg total)  by mouth 2 (two) times daily with a meal. -     POCT urinalysis dipstick -     Microalbumin/Creatinine Ratio, Urine  Left inguinal hernia -     Ambulatory referral to General Surgery   There are no diagnoses linked to this encounter.  No orders of the defined types were placed in this encounter.   Follow-up: No Follow-up on file.   Boykin Nearing MD

## 2015-12-12 NOTE — Progress Notes (Signed)
Pt has been having mouth pain, pt went to ED for pain.  Pt declined flu shot.

## 2015-12-12 NOTE — Patient Instructions (Addendum)
[name] was seen today for hypertension.  Diagnoses and all orders for this visit:  Essential hypertension -     BASIC METABOLIC PANEL WITH GFR -     carvedilol (COREG) 6.25 MG tablet; Take 1 tablet (6.25 mg total) by mouth 2 (two) times daily with a meal. -     POCT urinalysis dipstick  Left inguinal hernia -     Ambulatory referral to General Surgery   Start aldactone 12.5 mg daily Start coreg 6.25 mg twice daily  Stop clonidine which you were taken 0.1 mg nightly only.    F/u in 2 days for BP check with clinical pharmacologist  F/u with me in 2 weeks for HTN  Dr. Adrian Blackwater

## 2015-12-12 NOTE — Assessment & Plan Note (Signed)
Exam concerning for reducible hernia Enlarging per report gen surg referral placed

## 2015-12-13 ENCOUNTER — Telehealth: Payer: Self-pay | Admitting: *Deleted

## 2015-12-13 LAB — MICROALBUMIN / CREATININE URINE RATIO
CREATININE, URINE: 210 mg/dL (ref 20–370)
Microalb Creat Ratio: 11 mcg/mg creat (ref <30)
Microalb, Ur: 2.4 mg/dL

## 2015-12-13 NOTE — Telephone Encounter (Signed)
Medical Assistant left message on patient's home and cell voicemail. Voicemail states to give a call back to Josselyne Onofrio with [facility] at [phone].  

## 2015-12-13 NOTE — Telephone Encounter (Signed)
-----   Message from Boykin Nearing, MD sent at 12/13/2015  8:51 AM EDT ----- Urine microalbumin normal Normal BMP, normal renal function

## 2015-12-14 NOTE — Progress Notes (Deleted)
Patient arrives in poor spirits, ambulating without assistance.  He presents to the clinic for hypertension evaluation.   Diagnosed with Hypertension at age 40.    Current BP Medications include:  Lisinopril 40 mg daily, amlodipine 10 mg daily, carvedilol 6.25 mg BID, HCTZ 25 mg daily, spironolactone 12.5 mg daily   Antihypertensives tried in the past include: metoprolol, diltiazem, clonidine   O:  There were no vitals filed for this visit. Last 3 Office BP readings: BP Readings from Last 3 Encounters:  12/12/15 (!) 167/101  12/01/15 (!) 163/117  11/24/15 (!) 157/105    BMET    Component Value Date/Time   NA 140 12/12/2015 1306   K 3.5 12/12/2015 1306   CL 102 12/12/2015 1306   CO2 30 12/12/2015 1306   GLUCOSE 94 12/12/2015 1306   BUN 17 12/12/2015 1306   CREATININE 0.97 12/12/2015 1306   CALCIUM 9.5 12/12/2015 1306   GFRNONAA >89 12/12/2015 1306   GFRAA >89 12/12/2015 1306    A/P:  History of hypertension since age of 25 which currently ***   Results reviewed and written information provided.   F/U Clinic Visit ****. Total time in face-to-face counseling 20 minutes.  Patient seen with Biagio Borg, PharmD Candidate

## 2015-12-15 ENCOUNTER — Ambulatory Visit: Payer: Self-pay | Admitting: Pharmacist

## 2015-12-20 ENCOUNTER — Ambulatory Visit: Payer: Self-pay | Admitting: Pharmacist

## 2016-01-02 ENCOUNTER — Other Ambulatory Visit: Payer: Self-pay | Admitting: Surgery

## 2016-01-13 ENCOUNTER — Emergency Department (HOSPITAL_COMMUNITY)
Admission: EM | Admit: 2016-01-13 | Discharge: 2016-01-13 | Disposition: A | Discharge status: Home / Self Care | Payer: BLUE CROSS/BLUE SHIELD | Attending: Emergency Medicine | Admitting: Emergency Medicine

## 2016-01-13 ENCOUNTER — Encounter (HOSPITAL_COMMUNITY): Payer: Self-pay | Admitting: Emergency Medicine

## 2016-01-13 DIAGNOSIS — I10 Essential (primary) hypertension: Secondary | ICD-10-CM | POA: Diagnosis not present

## 2016-01-13 DIAGNOSIS — Z79899 Other long term (current) drug therapy: Secondary | ICD-10-CM | POA: Insufficient documentation

## 2016-01-13 DIAGNOSIS — Z87891 Personal history of nicotine dependence: Secondary | ICD-10-CM | POA: Diagnosis not present

## 2016-01-13 DIAGNOSIS — M546 Pain in thoracic spine: Secondary | ICD-10-CM | POA: Insufficient documentation

## 2016-01-13 LAB — URINE MICROSCOPIC-ADD ON
Bacteria, UA: NONE SEEN
RBC / HPF: NONE SEEN RBC/hpf (ref 0–5)
SQUAMOUS EPITHELIAL / LPF: NONE SEEN
WBC UA: NONE SEEN WBC/hpf (ref 0–5)

## 2016-01-13 LAB — URINALYSIS, ROUTINE W REFLEX MICROSCOPIC
BILIRUBIN URINE: NEGATIVE
Glucose, UA: NEGATIVE mg/dL
Hgb urine dipstick: NEGATIVE
KETONES UR: NEGATIVE mg/dL
LEUKOCYTES UA: NEGATIVE
NITRITE: NEGATIVE
PH: 7.5 (ref 5.0–8.0)
Protein, ur: NEGATIVE mg/dL
SPECIFIC GRAVITY, URINE: 1.014 (ref 1.005–1.030)

## 2016-01-13 MED ORDER — KETOROLAC TROMETHAMINE 60 MG/2ML IM SOLN
60.0000 mg | Freq: Once | INTRAMUSCULAR | Status: AC
  Administered 2016-01-13: 60 mg via INTRAMUSCULAR
  Filled 2016-01-13: qty 2

## 2016-01-13 MED ORDER — TRAMADOL HCL 50 MG PO TABS
50.0000 mg | ORAL_TABLET | Freq: Once | ORAL | Status: AC
  Administered 2016-01-13: 50 mg via ORAL
  Filled 2016-01-13: qty 1

## 2016-01-13 MED ORDER — LIDOCAINE 5 % EX PTCH
1.0000 | MEDICATED_PATCH | Freq: Once | CUTANEOUS | Status: DC
  Administered 2016-01-13: 1 via TRANSDERMAL
  Filled 2016-01-13: qty 1

## 2016-01-13 MED ORDER — TRAMADOL HCL 50 MG PO TABS: 50.0000 mg | ORAL_TABLET | Freq: Two times a day (BID) | ORAL | 0 refills | Status: DC | PRN

## 2016-01-13 NOTE — ED Provider Notes (Signed)
[facility] DEPT Provider Note   CSN: [id number] Arrival date & time: 01/13/16  0159     History   Chief Complaint Chief Complaint  Patient presents with  . Flank Pain    HPI [name] is a 40 y.o. male no sig PMH here with L upper and middle back pain.  Patient states this has been going on for the past 3 days.  He has not take anything for it.  It is worse with movement and walking.  It feels as if something sharp catches him.  He denies having pain like this in the past.  He works at [employer] and is lifting heavy boxes multiple times per day. He states he normally bends at his knee.  He denies any urinary symptoms or neurological complaints.  He has no fevers or recent infections.  There are no further complaints.  10 Systems reviewed and are negative for acute change except as noted in the HPI.   HPI  Past Medical History:  Diagnosis Date  . Hypertension 2003   dx age 15   . Wears glasses     Patient Active Problem List   Diagnosis Date Noted  . Left inguinal hernia 12/12/2015  . Left sided chest pain 11/10/2015  . Folliculitis barbae 0000000  . Poor dentition 05/27/2015  . Periodontitis 05/27/2015  . Pain in left wrist 05/27/2015  . HTN (hypertension) 05/12/2015    Past Surgical History:  Procedure Laterality Date  . CHOLECYSTECTOMY         Home Medications    Prior to Admission medications   Medication Sig Start Date End Date Taking? Authorizing Provider  amLODipine (NORVASC) 10 MG tablet Take 1 tablet (10 mg total) by mouth daily. 11/10/15   Josalyn Funches, MD  carvedilol (COREG) 6.25 MG tablet Take 1 tablet (6.25 mg total) by mouth 2 (two) times daily with a meal. 12/12/15   Josalyn Funches, MD  hydrochlorothiazide (HYDRODIURIL) 25 MG tablet Take 1 tablet (25 mg total) by mouth daily. 11/10/15   Josalyn Funches, MD  lisinopril (PRINIVIL,ZESTRIL) 40 MG tablet Take 1 tablet (40 mg total) by mouth daily. 11/10/15   Josalyn Funches, MD  spironolactone  (ALDACTONE) 25 MG tablet Take 0.5 tablets (12.5 mg total) by mouth daily. Patient not taking: Reported on 12/12/2015 11/24/15   Tresa Garter, MD    Family History Family History  Problem Relation Age of Onset  . Cancer Mother     Social History Social History  Substance Use Topics  . Smoking status: Former Smoker    Packs/day: 1.00    Types: Cigars    Quit date: 05/10/2015  . Smokeless tobacco: Never Used  . Alcohol use Yes     Comment: occ     Allergies   Penicillins   Review of Systems Review of Systems   Physical Exam Updated Vital Signs BP (!) 169/112 (BP Location: Left Arm)   Pulse 69   Temp 98.1 F (36.7 C) (Oral)   Resp 18   Ht 6\' 2"  (1.88 m)   Wt 215 lb (97.5 kg)   SpO2 96%   BMI 27.60 kg/m   Physical Exam  Constitutional: He is oriented to person, place, and time. Vital signs are normal. He appears well-developed and well-nourished.  Non-toxic appearance. He does not appear ill. No distress.  HENT:  Head: Normocephalic and atraumatic.  Nose: Nose normal.  Mouth/Throat: Oropharynx is clear and moist. No oropharyngeal exudate.  Eyes: Conjunctivae and EOM are normal.  Pupils are equal, round, and reactive to light. No scleral icterus.  Neck: Normal range of motion. Neck supple. No tracheal deviation, no edema, no erythema and normal range of motion present. No thyroid mass and no thyromegaly present.  Cardiovascular: Normal rate, regular rhythm, S1 normal, S2 normal, normal heart sounds, intact distal pulses and normal pulses.  Exam reveals no gallop and no friction rub.   No murmur heard. Pulmonary/Chest: Effort normal and breath sounds normal. No respiratory distress. He has no wheezes. He has no rhonchi. He has no rales.  Abdominal: Soft. Normal appearance and bowel sounds are normal. He exhibits no distension, no ascites and no mass. There is no hepatosplenomegaly. There is no tenderness. There is no rebound, no guarding and no CVA tenderness.    Musculoskeletal: Normal range of motion. He exhibits no edema, tenderness or deformity.  Lymphadenopathy:    He has no cervical adenopathy.  Neurological: He is alert and oriented to person, place, and time. He has normal strength. No cranial nerve deficit or sensory deficit.  Normal strength and sensation in all extremities, normal cerebellar testing.  Skin: Skin is warm, dry and intact. No petechiae and no rash noted. He is not diaphoretic. No erythema. No pallor.  Nursing note and vitals reviewed.    ED Treatments / Results  Labs (all labs ordered are listed, but only abnormal results are displayed) Labs Reviewed  URINALYSIS, ROUTINE W REFLEX MICROSCOPIC (NOT AT [facility]) - Abnormal; Notable for the following:       Result Value   APPearance TURBID (*)    All other components within normal limits  URINE MICROSCOPIC-ADD ON    EKG  EKG Interpretation None       Radiology No results found.  Procedures Procedures (including critical care time)  Medications Ordered in ED Medications  ketorolac (TORADOL) injection 60 mg (not administered)  lidocaine (LIDODERM) 5 % 1 patch (not administered)  traMADol (ULTRAM) tablet 50 mg (not administered)     Initial Impression / Assessment and Plan / ED Course  I have reviewed the triage vital signs and the nursing notes.  Pertinent labs & imaging results that were available during my care of the patient were reviewed by me and considered in my medical decision making (see chart for details).  Clinical Course    Patient presents to the ED for back pain.  Appears MSK by history and physical exam.  He was given toradol, lidocaine patch, and tramadol for pain control.  Will DC with 10tabs of tramadol to take PRN.  Will give work note for no heavy lifting x 1 week.  He has follow up later this morning with his PCP at [facility].  He appears well and in NAD.  VS remain within his normal limits and he is safe for DC.  Final  Clinical Impressions(s) / ED Diagnoses   Final diagnoses:  None    New Prescriptions New Prescriptions   No medications on file     Everlene Balls, MD 01/13/16 [phone]

## 2016-01-13 NOTE — ED Triage Notes (Signed)
Pt reports left flank pain for the last 4 days. Pt denies any urinary symptoms at this time. Pt reports worsening pain with movement.

## 2016-01-13 NOTE — ED Notes (Signed)
Warm blanket given

## 2016-01-16 ENCOUNTER — Ambulatory Visit: Payer: BLUE CROSS/BLUE SHIELD | Admitting: Family Medicine

## 2016-01-30 ENCOUNTER — Ambulatory Visit: Payer: BLUE CROSS/BLUE SHIELD | Admitting: Family Medicine

## 2016-02-16 ENCOUNTER — Encounter: Payer: Self-pay | Admitting: Family Medicine

## 2016-02-16 ENCOUNTER — Ambulatory Visit: Payer: BLUE CROSS/BLUE SHIELD | Attending: Family Medicine | Admitting: Family Medicine

## 2016-02-16 VITALS — BP 140/100 | HR 72 | Temp 98.0°F | Ht 74.5 in | Wt 223.4 lb

## 2016-02-16 DIAGNOSIS — M546 Pain in thoracic spine: Secondary | ICD-10-CM | POA: Insufficient documentation

## 2016-02-16 DIAGNOSIS — Z87891 Personal history of nicotine dependence: Secondary | ICD-10-CM | POA: Diagnosis not present

## 2016-02-16 DIAGNOSIS — I10 Essential (primary) hypertension: Secondary | ICD-10-CM | POA: Diagnosis not present

## 2016-02-16 DIAGNOSIS — Z79899 Other long term (current) drug therapy: Secondary | ICD-10-CM | POA: Diagnosis not present

## 2016-02-16 DIAGNOSIS — K4090 Unilateral inguinal hernia, without obstruction or gangrene, not specified as recurrent: Secondary | ICD-10-CM | POA: Diagnosis not present

## 2016-02-16 MED ORDER — SPIRONOLACTONE 25 MG PO TABS: 12.5000 mg | ORAL_TABLET | Freq: Every day | ORAL | 5 refills | Status: DC

## 2016-02-16 MED ORDER — CLONIDINE HCL 0.1 MG PO TABS
0.2000 mg | ORAL_TABLET | Freq: Once | ORAL | Status: AC
  Administered 2016-02-16: 0.2 mg via ORAL

## 2016-02-16 MED ORDER — CARVEDILOL 6.25 MG PO TABS: 6.2500 mg | ORAL_TABLET | Freq: Two times a day (BID) | ORAL | 5 refills | Status: DC

## 2016-02-16 MED ORDER — ACETAMINOPHEN-CODEINE #3 300-30 MG PO TABS: 1.0000 | ORAL_TABLET | Freq: Three times a day (TID) | ORAL | 0 refills | Status: DC | PRN

## 2016-02-16 MED ORDER — KETOROLAC TROMETHAMINE 60 MG/2ML IM SOLN
60.0000 mg | Freq: Once | INTRAMUSCULAR | Status: AC
  Administered 2016-02-16: 60 mg via INTRAMUSCULAR

## 2016-02-16 MED FILL — CARVEDILOL 6.25 MG TABLET: 6.25 | 30 days supply | Qty: 60 | Fill #0

## 2016-02-16 MED FILL — ?SPIRONOLACTONE 25 MG TABLE: 25 MG | 30 days supply | Qty: 15 | Fill #0

## 2016-02-16 NOTE — Assessment & Plan Note (Signed)
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

## 2016-02-16 NOTE — Assessment & Plan Note (Signed)
Uncontrolled due to med non compliance Refilled coreg and aldactone  Close f/u for BP recheck

## 2016-02-16 NOTE — Patient Instructions (Addendum)
[name] was seen today for hypertension.  Diagnoses and all orders for this visit:  Essential hypertension -     cloNIDine (CATAPRES) tablet 0.2 mg; Take 2 tablets (0.2 mg total) by mouth once. -     spironolactone (ALDACTONE) 25 MG tablet; Take 0.5 tablets (12.5 mg total) by mouth daily. -     carvedilol (COREG) 6.25 MG tablet; Take 1 tablet (6.25 mg total) by mouth 2 (two) times daily with a meal.  Acute left-sided thoracic back pain -     ketorolac (TORADOL) injection 60 mg; Inject 2 mLs (60 mg total) into the muscle once. -     acetaminophen-codeine (TYLENOL #3) 300-30 MG tablet; Take 1 tablet by mouth every 8 (eight) hours as needed.  Left inguinal hernia   F/u in 2 weeks for HTN   Dr. Adrian Blackwater

## 2016-02-16 NOTE — Progress Notes (Signed)
Pt is here today to follow up HTN.

## 2016-02-16 NOTE — Assessment & Plan Note (Signed)
Hernia brace prescribed Patient advised to avoid heavy lifting He has seen gen surg As of now he is not scheduled for surgery. He is looking into changing his insurance

## 2016-02-16 NOTE — Progress Notes (Signed)
Subjective:  Patient ID: [name], male    DOB: 1975/11/24  Age: 40 y.o. MRN: [id number]  CC: Hypertension   HPI [name] has HTN and inguinal hernia he presents for   1. CHRONIC HYPERTENSION  Disease Monitoring  Blood pressure range: not checking   Chest pain: no   Dyspnea: no  Claudication: no   Medication compliance: no, out of coreg and aldactone  Medication Side Effects  Lightheadedness: no   Urinary frequency: no   Edema: no   Impotence: no   2. Mid back pain: this is new started one month ago.  L sided. Stiffness and pain. Pain exacerbated by getting out of bed. He went to ED on . Treated with toradol shot, tramadol. Taking ibuprofen at home for pain. He has returned to work. Does heavy lifting at work.   3. R upper back pain:  This is chronic. Started about 3 years ago. Went to ED was told he had bone spurs in his neck. Has firm nodule in R upper back that is tender to touch.  No weakness or pain radiating down arm, Heaving lifting at work, e.g tires.   Social History  Substance Use Topics  . Smoking status: Former Smoker    Packs/day: 1.00    Types: Cigars    Quit date: 05/10/2015  . Smokeless tobacco: Never Used  . Alcohol use Yes     Comment: occ    Outpatient Medications Prior to Visit  Medication Sig Dispense Refill  . amLODipine (NORVASC) 10 MG tablet Take 1 tablet (10 mg total) by mouth daily. 30 tablet 5  . hydrochlorothiazide (HYDRODIURIL) 25 MG tablet Take 1 tablet (25 mg total) by mouth daily. 30 tablet 5  . lisinopril (PRINIVIL,ZESTRIL) 40 MG tablet Take 1 tablet (40 mg total) by mouth daily. 30 tablet 5  . carvedilol (COREG) 6.25 MG tablet Take 1 tablet (6.25 mg total) by mouth 2 (two) times daily with a meal. (Patient not taking: Reported on 02/16/2016) 60 tablet 3  . spironolactone (ALDACTONE) 25 MG tablet Take 0.5 tablets (12.5 mg total) by mouth daily. (Patient not taking: Reported on 02/16/2016) 15 tablet 2  . traMADol (ULTRAM) 50  MG tablet Take 1 tablet (50 mg total) by mouth every 12 (twelve) hours as needed for severe pain. (Patient not taking: Reported on 02/16/2016) 10 tablet 0   No facility-administered medications prior to visit.     ROS Review of Systems  Constitutional: Negative for chills, fatigue, fever and unexpected weight change.  Eyes: Negative for visual disturbance.  Respiratory: Negative for cough and shortness of breath.   Cardiovascular: Negative for chest pain, palpitations and leg swelling.  Gastrointestinal: Negative for abdominal distention, abdominal pain, blood in stool, constipation, diarrhea, nausea and vomiting.  Endocrine: Negative for polydipsia, polyphagia and polyuria.  Genitourinary: Decreased urine volume: R upper back, L thoracic   Musculoskeletal: Positive for arthralgias and back pain. Negative for gait problem, myalgias and neck pain.  Skin: Negative for rash.  Allergic/Immunologic: Negative for immunocompromised state.  Hematological: Negative for adenopathy. Does not bruise/bleed easily.  Psychiatric/Behavioral: Negative for dysphoric mood, sleep disturbance and suicidal ideas. The patient is not nervous/anxious.     Objective:  BP (!) 160/120 (BP Location: Left Arm, Patient Position: Sitting, Cuff Size: Small)   Pulse 72   Temp 98 F (36.7 C) (Oral)   Ht 6' 2.5" (1.892 m)   Wt 223 lb 6.4 oz (101.3 kg)   SpO2 98%  BMI 28.30 kg/m   BP/Weight 02/16/2016 01/13/2016 99991111  Systolic BP 0000000 [id number]  Diastolic BP 123456 [id number]  Wt. (Lbs) 223.4 215 208  BMI 28.3 27.6 26.35    Physical Exam  Constitutional: He appears well-developed and well-nourished. No distress.  HENT:  Head: Normocephalic and atraumatic.  Neck: Normal range of motion. Neck supple.  Cardiovascular: Normal rate, regular rhythm, normal heart sounds and intact distal pulses.   Pulmonary/Chest: Effort normal and breath sounds normal.  Abdominal: A hernia is present. Hernia confirmed positive in the  left inguinal area.    Musculoskeletal: He exhibits no edema.       Arms:      Legs: Neurological: He is alert.  Skin: Skin is warm and dry. No rash noted. No erythema.  Psychiatric: He has a normal mood and affect.    Treated with clonidine 0.2 mg PO x one Repeat BP 140/100  Assessment & Plan:  Masuo was seen today for hypertension.  Diagnoses and all orders for this visit:  Essential hypertension -     cloNIDine (CATAPRES) tablet 0.2 mg; Take 2 tablets (0.2 mg total) by mouth once. -     spironolactone (ALDACTONE) 25 MG tablet; Take 0.5 tablets (12.5 mg total) by mouth daily. -     carvedilol (COREG) 6.25 MG tablet; Take 1 tablet (6.25 mg total) by mouth 2 (two) times daily with a meal.  Acute left-sided thoracic back pain -     ketorolac (TORADOL) injection 60 mg; Inject 2 mLs (60 mg total) into the muscle once. -     acetaminophen-codeine (TYLENOL #3) 300-30 MG tablet; Take 1 tablet by mouth every 8 (eight) hours as needed.  Left inguinal hernia   There are no diagnoses linked to this encounter.  Meds ordered this encounter  Medications  . cloNIDine (CATAPRES) tablet 0.2 mg  . spironolactone (ALDACTONE) 25 MG tablet    Sig: Take 0.5 tablets (12.5 mg total) by mouth daily.    Dispense:  30 tablet    Refill:  5  . carvedilol (COREG) 6.25 MG tablet    Sig: Take 1 tablet (6.25 mg total) by mouth 2 (two) times daily with a meal.    Dispense:  60 tablet    Refill:  5  . ketorolac (TORADOL) injection 60 mg  . acetaminophen-codeine (TYLENOL #3) 300-30 MG tablet    Sig: Take 1 tablet by mouth every 8 (eight) hours as needed.    Dispense:  30 tablet    Refill:  0    Follow-up: Return in about 2 weeks (around 03/01/2016) for HTN .   Boykin Nearing MD

## 2016-02-23 ENCOUNTER — Telehealth: Payer: Self-pay | Admitting: Family Medicine

## 2016-02-23 NOTE — Telephone Encounter (Signed)
Pt was called and a message was left with his grandmother informing him that his letter is ready for pick up.

## 2016-02-23 NOTE — Telephone Encounter (Signed)
Letter written and ready for pick up Please call patient   He is to limit lifting until his hernia is repaired

## 2016-02-23 NOTE — Telephone Encounter (Signed)
Pt. Called stating that the letter that was giving to him on 12/7 by his PCP needs to be adjusted. Pt. Gave the letter to his supervisor but his supervisor needs for the letter to say if it is permanent of temporary for the pt. To pick up 20 lbs. Pt. Was sent home last night b/c the letter did not specify. Pt. Stated it is ok to LVM.  Please f/u with pt.

## 2016-02-23 NOTE — Telephone Encounter (Signed)
Will route to PCP 

## 2016-03-01 ENCOUNTER — Ambulatory Visit: Payer: BLUE CROSS/BLUE SHIELD | Admitting: Family Medicine

## 2016-04-06 MED FILL — ACETAMINOPHEN/COD #3 TABLET: 300-30 | 10 days supply | Qty: 30 | Fill #0

## 2016-04-13 MED FILL — ?SPIRONOLACTONE 25 MG TABLE: 25 MG | 30 days supply | Qty: 15 | Fill #1

## 2016-05-24 ENCOUNTER — Ambulatory Visit: Payer: BLUE CROSS/BLUE SHIELD

## 2016-07-09 ENCOUNTER — Ambulatory Visit: Payer: Self-pay | Attending: Family Medicine | Admitting: Family Medicine

## 2016-07-09 ENCOUNTER — Encounter: Payer: Self-pay | Admitting: Family Medicine

## 2016-07-09 VITALS — BP 162/119 | HR 72 | Temp 98.2°F | Ht 74.5 in | Wt 233.4 lb

## 2016-07-09 DIAGNOSIS — I10 Essential (primary) hypertension: Secondary | ICD-10-CM

## 2016-07-09 DIAGNOSIS — K089 Disorder of teeth and supporting structures, unspecified: Secondary | ICD-10-CM

## 2016-07-09 DIAGNOSIS — Z114 Encounter for screening for human immunodeficiency virus [HIV]: Secondary | ICD-10-CM

## 2016-07-09 DIAGNOSIS — Z79899 Other long term (current) drug therapy: Secondary | ICD-10-CM | POA: Insufficient documentation

## 2016-07-09 DIAGNOSIS — K4090 Unilateral inguinal hernia, without obstruction or gangrene, not specified as recurrent: Secondary | ICD-10-CM | POA: Insufficient documentation

## 2016-07-09 DIAGNOSIS — Z87891 Personal history of nicotine dependence: Secondary | ICD-10-CM | POA: Insufficient documentation

## 2016-07-09 DIAGNOSIS — R229 Localized swelling, mass and lump, unspecified: Secondary | ICD-10-CM

## 2016-07-09 DIAGNOSIS — M546 Pain in thoracic spine: Secondary | ICD-10-CM

## 2016-07-09 LAB — POCT URINALYSIS DIPSTICK
BILIRUBIN UA: NEGATIVE
Blood, UA: NEGATIVE
GLUCOSE UA: NEGATIVE
Ketones, UA: NEGATIVE
LEUKOCYTES UA: NEGATIVE
NITRITE UA: NEGATIVE
PH UA: 7.5 (ref 5.0–8.0)
Spec Grav, UA: 1.015 (ref 1.010–1.025)
Urobilinogen, UA: 0.2 E.U./dL

## 2016-07-09 MED ORDER — AMLODIPINE BESYLATE 10 MG PO TABS: 10.0000 mg | ORAL_TABLET | Freq: Every day | ORAL | 5 refills | Status: DC

## 2016-07-09 MED ORDER — CLONIDINE HCL 0.2 MG PO TABS
0.2000 mg | ORAL_TABLET | Freq: Once | ORAL | Status: AC
  Administered 2016-07-09: 0.2 mg via ORAL

## 2016-07-09 MED ORDER — CARVEDILOL 6.25 MG PO TABS: 6.2500 mg | ORAL_TABLET | Freq: Two times a day (BID) | ORAL | 5 refills | Status: DC

## 2016-07-09 MED ORDER — ACETAMINOPHEN-CODEINE #3 300-30 MG PO TABS: 1.0000 | ORAL_TABLET | Freq: Three times a day (TID) | ORAL | 0 refills | Status: DC | PRN

## 2016-07-09 MED ORDER — SPIRONOLACTONE 50 MG PO TABS: 50.0000 mg | ORAL_TABLET | Freq: Every day | ORAL | 2 refills | Status: DC

## 2016-07-09 NOTE — Assessment & Plan Note (Signed)
A: recurrent L sided back pain Normal UA- no findings to suggest stone or infection Tylenol #3 for pain CXR

## 2016-07-09 NOTE — Progress Notes (Signed)
Pt is having pain in neck and back.(left side) Pt has been out of BP meds for 3 days.

## 2016-07-09 NOTE — Assessment & Plan Note (Signed)
Suspect benign fibromas Referral to dermatology for biopsy

## 2016-07-09 NOTE — Patient Instructions (Addendum)
[name] was seen today for hypertension.  Diagnoses and all orders for this visit:  Acute left-sided thoracic back pain -     DG Chest 2 View; Future -     CBC -     acetaminophen-codeine (TYLENOL #3) 300-30 MG tablet; Take 1 tablet by mouth every 8 (eight) hours as needed.  Essential hypertension -     cloNIDine (CATAPRES) tablet 0.2 mg; Take 1 tablet (0.2 mg total) by mouth once. -     POCT urinalysis dipstick -     VAS US RENAL ARTERY DUPLEX; Future -     Aldosterone + renin activity w/ ratio -     CMP14+EGFR -     amLODipine (NORVASC) 10 MG tablet; Take 1 tablet (10 mg total) by mouth daily. -     carvedilol (COREG) 6.25 MG tablet; Take 1 tablet (6.25 mg total) by mouth 2 (two) times daily with a meal. -     spironolactone (ALDACTONE) 50 MG tablet; Take 1 tablet (50 mg total) by mouth daily.  Poor dentition -     Ambulatory referral to Dentistry  Subcutaneous nodules -     Ambulatory referral to Dermatology  Screening for HIV (human immunodeficiency virus) -     HIV antibody (with reflex)   Return in 2 weeks for HTN/BP check with clinical pharmacologist  f/u with me in 4 weeks for HTN   Dr. Adrian Blackwater

## 2016-07-09 NOTE — Progress Notes (Signed)
Subjective:  Patient ID: [name], male    DOB: 1975-10-25  Age: 41 y.o. MRN: [id number]  CC: Hypertension   HPI [name] has HTN and inguinal hernia he presents for   1. CHRONIC HYPERTENSION diagnosed at age 69 with history of hypokalemia.   Disease Monitoring  Blood pressure range: not checking   Chest pain: no   Dyspnea: no  Claudication: no   Medication compliance: no, out of coreg and aldactone  Medication Side Effects  Lightheadedness: no   Urinary frequency: no   Edema: no   Impotence: no   2.  L sided Mid back pain: started 2-3 days. Deep inside pain.also with some L upper back and neck pain radiating down to his L shoulder.  No dysuria, no fever or chills. No pelvic or abdominal pain. No heavy lifting. He stopped working at [employer] 4 months ago. He is unemployed. He reports taking one ibuprofen which relieved the pain.   Past Medical History:  Diagnosis Date  . Hypertension 2003   dx age 68   . Wears glasses     Social History  Substance Use Topics  . Smoking status: Former Smoker    Packs/day: 1.00    Types: Cigars    Quit date: 05/10/2015  . Smokeless tobacco: Never Used  . Alcohol use Yes     Comment: occ    Outpatient Medications Prior to Visit  Medication Sig Dispense Refill  . amLODipine (NORVASC) 10 MG tablet Take 1 tablet (10 mg total) by mouth daily. 30 tablet 5  . carvedilol (COREG) 6.25 MG tablet Take 1 tablet (6.25 mg total) by mouth 2 (two) times daily with a meal. 60 tablet 5  . spironolactone (ALDACTONE) 25 MG tablet Take 0.5 tablets (12.5 mg total) by mouth daily. 30 tablet 5  . acetaminophen-codeine (TYLENOL #3) 300-30 MG tablet Take 1 tablet by mouth every 8 (eight) hours as needed. (Patient not taking: Reported on 07/09/2016) 30 tablet 0  . hydrochlorothiazide (HYDRODIURIL) 25 MG tablet Take 1 tablet (25 mg total) by mouth daily. (Patient not taking: Reported on 07/09/2016) 30 tablet 5  . lisinopril (PRINIVIL,ZESTRIL) 40 MG  tablet Take 1 tablet (40 mg total) by mouth daily. (Patient not taking: Reported on 07/09/2016) 30 tablet 5   No facility-administered medications prior to visit.     ROS Review of Systems  Constitutional: Negative for chills, fatigue, fever and unexpected weight change.  HENT: Positive for dental problem (broken teeth, no pain or swelling).   Eyes: Negative for visual disturbance.  Respiratory: Negative for cough and shortness of breath.   Cardiovascular: Negative for chest pain, palpitations and leg swelling.  Gastrointestinal: Negative for abdominal distention, abdominal pain, blood in stool, constipation, diarrhea, nausea and vomiting.  Endocrine: Negative for polydipsia, polyphagia and polyuria.  Musculoskeletal: Positive for arthralgias and back pain (L thoracic ). Negative for gait problem, myalgias and neck pain.  Skin: Negative for rash.  Allergic/Immunologic: Negative for immunocompromised state.  Hematological: Negative for adenopathy. Does not bruise/bleed easily.  Psychiatric/Behavioral: Negative for dysphoric mood, sleep disturbance and suicidal ideas. The patient is not nervous/anxious.     Objective:  BP (!) 190/122   Pulse 72   Temp 98.2 F (36.8 C) (Oral)   Ht 6' 2.5" (1.892 m)   Wt 233 lb 6.4 oz (105.9 kg)   SpO2 97%   BMI 29.57 kg/m   BP/Weight 07/09/2016 02/16/2016 21/04/2480  Systolic BP 500 370 488  Diastolic BP 891 694  112  Wt. (Lbs) 233.4 223.4 215  BMI 29.57 28.3 27.6    Physical Exam  Constitutional: He appears well-developed and well-nourished. No distress.  HENT:  Head: Normocephalic and atraumatic.  Mouth/Throat: Abnormal dentition. Dental caries present. No dental abscesses.  Neck: Normal range of motion. Neck supple.  Cardiovascular: Normal rate, regular rhythm, normal heart sounds and intact distal pulses.   Pulmonary/Chest: Effort normal and breath sounds normal.  Abdominal: There is no CVA tenderness. A hernia is present. Hernia confirmed  positive in the left inguinal area.    Musculoskeletal: He exhibits no edema.       Arms:      Legs: Neurological: He is alert.  Skin: Skin is warm and dry. No rash noted. No erythema.  Psychiatric: He has a normal mood and affect.    Treated with clonidine 0.2 mg PO x one  Repeat BP 162/119    Chemistry      Component Value Date/Time   NA 140 12/12/2015 1306   K 3.5 12/12/2015 1306   CL 102 12/12/2015 1306   CO2 30 12/12/2015 1306   BUN 17 12/12/2015 1306   CREATININE 0.97 12/12/2015 1306      Component Value Date/Time   CALCIUM 9.5 12/12/2015 1306   ALKPHOS 76 05/10/2015 0217   AST 20 05/10/2015 0217   ALT 18 05/10/2015 0217   BILITOT 0.6 05/10/2015 0217      Assessment & Plan:  [name] was seen today for hypertension.  Diagnoses and all orders for this visit:  Acute left-sided thoracic back pain -     DG Chest 2 View; Future -     CBC -     acetaminophen-codeine (TYLENOL #3) 300-30 MG tablet; Take 1 tablet by mouth every 8 (eight) hours as needed.  Essential hypertension -     cloNIDine (CATAPRES) tablet 0.2 mg; Take 1 tablet (0.2 mg total) by mouth once. -     POCT urinalysis dipstick -     VAS US RENAL ARTERY DUPLEX; Future -     Aldosterone + renin activity w/ ratio -     CMP14+EGFR -     amLODipine (NORVASC) 10 MG tablet; Take 1 tablet (10 mg total) by mouth daily. -     carvedilol (COREG) 6.25 MG tablet; Take 1 tablet (6.25 mg total) by mouth 2 (two) times daily with a meal. -     spironolactone (ALDACTONE) 50 MG tablet; Take 1 tablet (50 mg total) by mouth daily.  Poor dentition -     Ambulatory referral to Dentistry  Subcutaneous nodules -     Ambulatory referral to Dermatology  Screening for HIV (human immunodeficiency virus) -     HIV antibody (with reflex)   There are no diagnoses linked to this encounter.  Meds ordered this encounter  Medications  . cloNIDine (CATAPRES) tablet 0.2 mg    Follow-up: Return in about 2 weeks (around  07/23/2016) for BP check .   Boykin Nearing MD

## 2016-07-09 NOTE — Assessment & Plan Note (Addendum)
A: uncontrolled HTN Med: compliant until 3 days ago P: Increase aldactone to 50 mg daily Restart coreg 6.25 mg BID restart Norvasc 10 mg daily Renal artery ultrasound as one has not been done

## 2016-07-11 ENCOUNTER — Ambulatory Visit (HOSPITAL_COMMUNITY): Admission: RE | Admit: 2016-07-11 | Payer: Self-pay | Source: Ambulatory Visit

## 2016-07-11 MED FILL — ?AMLODIPINE BESYLATE 10 MG: 10 | 30 days supply | Qty: 30 | Fill #0

## 2016-07-11 MED FILL — ?CARVEDILOL 6.25 MG TABLET: 6.25 | 30 days supply | Qty: 60 | Fill #0

## 2016-07-11 MED FILL — SPIRONOLACTONE 50 MG TABLET: 50 | 30 days supply | Qty: 30 | Fill #0

## 2016-07-12 ENCOUNTER — Ambulatory Visit (HOSPITAL_COMMUNITY)
Admission: RE | Admit: 2016-07-12 | Discharge: 2016-07-12 | Disposition: A | Discharge status: Home / Self Care | Payer: Self-pay | Source: Ambulatory Visit | Attending: Family Medicine | Admitting: Family Medicine

## 2016-07-12 DIAGNOSIS — I10 Essential (primary) hypertension: Secondary | ICD-10-CM | POA: Insufficient documentation

## 2016-07-12 MED FILL — ACETAMINOPHEN/COD #3 TABLET: 300-30 | 7 days supply | Qty: 21 | Fill #0

## 2016-07-12 NOTE — Progress Notes (Signed)
**  Preliminary report by tech**  Renal artery duplex complete. No evidence of renal artery stenosis noted bilaterally.  Bilateral normal intrarenal resistive indices.  07/12/16 12:03 PM [name] RVT

## 2016-07-13 ENCOUNTER — Telehealth: Payer: Self-pay

## 2016-07-13 NOTE — Telephone Encounter (Signed)
Pt was called and there is no Vm set up to leave a VM.

## 2016-07-16 ENCOUNTER — Telehealth: Payer: Self-pay

## 2016-07-16 NOTE — Telephone Encounter (Signed)
Pt was called and informed of lab results. 

## 2016-07-25 ENCOUNTER — Encounter: Payer: Self-pay | Admitting: Family Medicine

## 2016-08-29 ENCOUNTER — Telehealth: Payer: Self-pay | Admitting: Family Medicine

## 2016-08-29 MED FILL — AMLODIPINE BESYLATE 10 MG T: 10 | 30 days supply | Qty: 30 | Fill #1

## 2016-08-29 MED FILL — SPIRONOLACTONE 50 MG TAB: 50 | 30 days supply | Qty: 30 | Fill #1

## 2016-08-29 MED FILL — CARVEDILOL 6.25 MG TABLET: 6.25 | 30 days supply | Qty: 60 | Fill #1

## 2016-09-05 NOTE — Progress Notes (Signed)
Subjective:  Patient ID: [name], male    DOB: 09/13/1975  Age: 41 y.o. MRN: [id number]  CC: Hypertension   HPI [name] has HTN and inguinal hernia he presents for   1. CHRONIC HYPERTENSION diagnosed at age 24 with history of hypokalemia and mild cardiomegaly on CXR>   Disease Monitoring  Blood pressure range: not checking   Chest pain: no   Dyspnea: no  Claudication: no   Medication compliance: yes Norvasc 10, coreg 6.25 mg BID, aldactone 50 mg daily  Medication Side Effects  Lightheadedness: no   Urinary frequency: no   Edema: no   Impotence: no   2.  L sided Mid back pain: persist. Come and goes. Sharp and achy pain in low back. He also has pain radiating down his L leg. He has had some level of low back pain for the past 10 years.   Lumbar MRI 06/11/2007 IMPRESSION: 1.  No interval change in the L4 and L5 disc protrusion/extrusion compared to 2007. 2.  Progression of degenerative disc disease at L1, now with L1 endplate changes. 3.  Stable decreased fatty marrow signal, nonspecific.  Past Medical History:  Diagnosis Date  . Hypertension 2003   dx age 42   . Wears glasses     Social History  Substance Use Topics  . Smoking status: Former Smoker    Packs/day: 1.00    Types: Cigars    Quit date: 05/10/2015  . Smokeless tobacco: Never Used  . Alcohol use Yes     Comment: occ    Outpatient Medications Prior to Visit  Medication Sig Dispense Refill  . acetaminophen-codeine (TYLENOL #3) 300-30 MG tablet Take 1 tablet by mouth every 8 (eight) hours as needed. 21 tablet 0  . amLODipine (NORVASC) 10 MG tablet Take 1 tablet (10 mg total) by mouth daily. 30 tablet 5  . carvedilol (COREG) 6.25 MG tablet Take 1 tablet (6.25 mg total) by mouth 2 (two) times daily with a meal. 60 tablet 5  . spironolactone (ALDACTONE) 50 MG tablet Take 1 tablet (50 mg total) by mouth daily. 30 tablet 2   No facility-administered medications prior to visit.      ROS Review of Systems  Constitutional: Negative for chills, fatigue, fever and unexpected weight change.  HENT: Positive for dental problem (broken teeth, no pain or swelling).   Eyes: Negative for visual disturbance.  Respiratory: Negative for cough and shortness of breath.   Cardiovascular: Negative for chest pain, palpitations and leg swelling.  Gastrointestinal: Negative for abdominal distention, abdominal pain, blood in stool, constipation, diarrhea, nausea and vomiting.  Endocrine: Negative for polydipsia, polyphagia and polyuria.  Musculoskeletal: Positive for arthralgias and back pain (L thoracic ). Negative for gait problem, myalgias and neck pain.  Skin: Negative for rash.  Allergic/Immunologic: Negative for immunocompromised state.  Hematological: Negative for adenopathy. Does not bruise/bleed easily.  Psychiatric/Behavioral: Negative for dysphoric mood, sleep disturbance and suicidal ideas. The patient is not nervous/anxious.     Objective:  BP (!) 140/96   Pulse 74   Temp 97.8 F (36.6 C) (Oral)   Wt 223 lb 6.4 oz (101.3 kg)   SpO2 98%   BMI 28.30 kg/m   BP/Weight 09/06/2016 07/09/2016 41/04/8784  Systolic BP 767 209 470  Diastolic BP 96 962 836  Wt. (Lbs) 223.4 233.4 223.4  BMI 28.3 29.57 28.3    Physical Exam  Constitutional: He appears well-developed and well-nourished. No distress.  HENT:  Head: Normocephalic and  atraumatic.  Mouth/Throat: Abnormal dentition. Dental caries present. No dental abscesses.  Neck: Normal range of motion. Neck supple.  Cardiovascular: Normal rate, regular rhythm, normal heart sounds and intact distal pulses.   Pulmonary/Chest: Effort normal and breath sounds normal.  Abdominal: There is no CVA tenderness. A hernia is present. Hernia confirmed positive in the left inguinal area.    Musculoskeletal: He exhibits no edema.       Arms:      Legs: Neurological: He is alert.  Skin: Skin is warm and dry. No rash noted. No  erythema.  Psychiatric: He has a normal mood and affect.      Chemistry      Component Value Date/Time   NA 140 12/12/2015 1306   K 3.5 12/12/2015 1306   CL 102 12/12/2015 1306   CO2 30 12/12/2015 1306   BUN 17 12/12/2015 1306   CREATININE 0.97 12/12/2015 1306      Component Value Date/Time   CALCIUM 9.5 12/12/2015 1306   ALKPHOS 76 05/10/2015 0217   AST 20 05/10/2015 0217   ALT 18 05/10/2015 0217   BILITOT 0.6 05/10/2015 0217      Assessment & Plan:  Carrie was seen today for hypertension.  Diagnoses and all orders for this visit:  Essential hypertension -     CMP14+EGFR -     CBC -     Aldosterone + renin activity w/ ratio -     spironolactone (ALDACTONE) 50 MG tablet; Take 1 tablet (50 mg total) by mouth daily.  Screening for HIV (human immunodeficiency virus) -     HIV antibody (with reflex)  Chronic left-sided low back pain, with sciatica presence unspecified -     DG Lumbar Spine 2-3 Views; Future  Acute left-sided thoracic back pain -     acetaminophen-codeine (TYLENOL #3) 300-30 MG tablet; Take 1 tablet by mouth every 8 (eight) hours as needed.  Other orders -     Tdap vaccine greater than or equal to 7yo IM   There are no diagnoses linked to this encounter.  No orders of the defined types were placed in this encounter.   Follow-up: Return in about 3 months (around 12/07/2016) for HTN.   Boykin Nearing MD

## 2016-09-06 ENCOUNTER — Encounter: Payer: Self-pay | Admitting: Family Medicine

## 2016-09-06 ENCOUNTER — Ambulatory Visit: Payer: Self-pay | Attending: Family Medicine | Admitting: Family Medicine

## 2016-09-06 VITALS — BP 140/96 | HR 74 | Temp 97.8°F | Wt 223.4 lb

## 2016-09-06 DIAGNOSIS — M546 Pain in thoracic spine: Secondary | ICD-10-CM | POA: Insufficient documentation

## 2016-09-06 DIAGNOSIS — M545 Low back pain: Secondary | ICD-10-CM

## 2016-09-06 DIAGNOSIS — Z114 Encounter for screening for human immunodeficiency virus [HIV]: Secondary | ICD-10-CM | POA: Insufficient documentation

## 2016-09-06 DIAGNOSIS — Z87891 Personal history of nicotine dependence: Secondary | ICD-10-CM | POA: Insufficient documentation

## 2016-09-06 DIAGNOSIS — K4090 Unilateral inguinal hernia, without obstruction or gangrene, not specified as recurrent: Secondary | ICD-10-CM | POA: Insufficient documentation

## 2016-09-06 DIAGNOSIS — G8929 Other chronic pain: Secondary | ICD-10-CM

## 2016-09-06 DIAGNOSIS — I10 Essential (primary) hypertension: Secondary | ICD-10-CM | POA: Insufficient documentation

## 2016-09-06 DIAGNOSIS — Z79899 Other long term (current) drug therapy: Secondary | ICD-10-CM | POA: Insufficient documentation

## 2016-09-06 DIAGNOSIS — M5442 Lumbago with sciatica, left side: Secondary | ICD-10-CM | POA: Insufficient documentation

## 2016-09-06 DIAGNOSIS — K0530 Chronic periodontitis, unspecified: Secondary | ICD-10-CM

## 2016-09-06 DIAGNOSIS — Z23 Encounter for immunization: Secondary | ICD-10-CM

## 2016-09-06 MED ORDER — CARVEDILOL 12.5 MG PO TABS: 12.5000 mg | ORAL_TABLET | Freq: Two times a day (BID) | ORAL | 2 refills | Status: DC

## 2016-09-06 MED ORDER — ACETAMINOPHEN-CODEINE #3 300-30 MG PO TABS: 1.0000 | ORAL_TABLET | Freq: Three times a day (TID) | ORAL | 0 refills | Status: DC | PRN

## 2016-09-06 MED ORDER — SPIRONOLACTONE 50 MG PO TABS: 50.0000 mg | ORAL_TABLET | Freq: Every day | ORAL | 5 refills | Status: DC

## 2016-09-06 NOTE — Patient Instructions (Addendum)
[name] was seen today for hypertension.  Diagnoses and all orders for this visit:  Essential hypertension -     CMP14+EGFR -     CBC -     Aldosterone + renin activity w/ ratio -     spironolactone (ALDACTONE) 50 MG tablet; Take 1 tablet (50 mg total) by mouth daily.  Screening for HIV (human immunodeficiency virus) -     HIV antibody (with reflex)  Chronic left-sided low back pain, with sciatica presence unspecified -     DG Lumbar Spine 2-3 Views; Future  Acute left-sided thoracic back pain -     acetaminophen-codeine (TYLENOL #3) 300-30 MG tablet; Take 1 tablet by mouth every 8 (eight) hours as needed.  complete x-ray of low back at [facility] 1st floor of hospital   Please apply for [place] discount and orange card, you can also inquire if any of your medications are on the PASS (medications assistance) list.   F/u in 3 months for HTN Dr. Adrian Blackwater

## 2016-09-06 NOTE — Assessment & Plan Note (Addendum)
A: improved, but still elevated Med: compliant P: Increase coreg from 6.25 to 12.5 mg BID Continue Norvasc 10 mg daily Continue aldactone 50 mg daily  Checking CMP and aldosterone + renin activity to evaluate for primary aldosteronism

## 2016-09-06 NOTE — Assessment & Plan Note (Signed)
A: acute component to chronic pain P: Lumbar x-ray

## 2016-09-06 NOTE — Assessment & Plan Note (Signed)
Dental referral in place Patient is uninsured Advised to apply for [place] discount

## 2016-09-13 ENCOUNTER — Telehealth: Payer: Self-pay

## 2016-09-13 NOTE — Telephone Encounter (Signed)
Pt was called and a VM was left informing pt to return phone call for lab results. If pt returns phone call inform him:  Normal CBC, CMP  Screening HIV negative

## 2016-09-14 ENCOUNTER — Telehealth: Payer: Self-pay

## 2016-09-14 LAB — CBC
Hematocrit: 47.8 % (ref 37.5–51.0)
Hemoglobin: 16.9 g/dL (ref 13.0–17.7)
MCH: 30.4 pg (ref 26.6–33.0)
MCHC: 35.4 g/dL (ref 31.5–35.7)
MCV: 86 fL (ref 79–97)
PLATELETS: 300 10*3/uL (ref 150–379)
RBC: 5.56 x10E6/uL (ref 4.14–5.80)
RDW: 14.6 % (ref 12.3–15.4)
WBC: 7.3 10*3/uL (ref 3.4–10.8)

## 2016-09-14 LAB — CMP14+EGFR
A/G RATIO: 1.3 (ref 1.2–2.2)
ALBUMIN: 4.3 g/dL (ref 3.5–5.5)
ALK PHOS: 97 IU/L (ref 39–117)
ALT: 19 IU/L (ref 0–44)
AST: 14 IU/L (ref 0–40)
BILIRUBIN TOTAL: 0.3 mg/dL (ref 0.0–1.2)
BUN/Creatinine Ratio: 10 (ref 9–20)
BUN: 10 mg/dL (ref 6–24)
CHLORIDE: 101 mmol/L (ref 96–106)
CO2: 26 mmol/L (ref 20–29)
Calcium: 9.7 mg/dL (ref 8.7–10.2)
Creatinine, Ser: 1.05 mg/dL (ref 0.76–1.27)
GFR calc Af Amer: 101 mL/min/{1.73_m2} (ref >59)
GFR calc non Af Amer: 87 mL/min/{1.73_m2} (ref >59)
GLOBULIN, TOTAL: 3.4 g/dL (ref 1.5–4.5)
Glucose: 93 mg/dL (ref 65–99)
Potassium: 4 mmol/L (ref 3.5–5.2)
SODIUM: 140 mmol/L (ref 134–144)
Total Protein: 7.7 g/dL (ref 6.0–8.5)

## 2016-09-14 LAB — ALDOSTERONE + RENIN ACTIVITY W/ RATIO
ALDOS/RENIN RATIO: 12.1 (ref 0.0–30.0)
ALDOSTERONE: 9.4 ng/dL (ref 0.0–30.0)
RENIN: 0.776 ng/mL/h (ref 0.167–5.380)

## 2016-09-14 LAB — HIV ANTIBODY (ROUTINE TESTING W REFLEX): HIV Screen 4th Generation wRfx: NONREACTIVE

## 2016-09-14 NOTE — Telephone Encounter (Signed)
Pt was called and informed of lab results. 

## 2016-09-24 ENCOUNTER — Emergency Department (HOSPITAL_COMMUNITY)
Admission: EM | Admit: 2016-09-24 | Discharge: 2016-09-24 | Disposition: A | Discharge status: Home / Self Care | Payer: Self-pay | Attending: Emergency Medicine | Admitting: Emergency Medicine

## 2016-09-24 ENCOUNTER — Ambulatory Visit (HOSPITAL_COMMUNITY)
Admission: RE | Admit: 2016-09-24 | Discharge: 2016-09-24 | Disposition: A | Discharge status: Home / Self Care | Payer: Self-pay | Source: Ambulatory Visit | Attending: Family Medicine | Admitting: Family Medicine

## 2016-09-24 ENCOUNTER — Emergency Department (HOSPITAL_COMMUNITY): Payer: Self-pay

## 2016-09-24 ENCOUNTER — Encounter (HOSPITAL_COMMUNITY): Payer: Self-pay | Admitting: *Deleted

## 2016-09-24 DIAGNOSIS — I10 Essential (primary) hypertension: Secondary | ICD-10-CM | POA: Insufficient documentation

## 2016-09-24 DIAGNOSIS — Z87891 Personal history of nicotine dependence: Secondary | ICD-10-CM | POA: Insufficient documentation

## 2016-09-24 DIAGNOSIS — M47816 Spondylosis without myelopathy or radiculopathy, lumbar region: Secondary | ICD-10-CM | POA: Insufficient documentation

## 2016-09-24 DIAGNOSIS — R1032 Left lower quadrant pain: Secondary | ICD-10-CM | POA: Insufficient documentation

## 2016-09-24 DIAGNOSIS — Z880 Allergy status to penicillin: Secondary | ICD-10-CM | POA: Insufficient documentation

## 2016-09-24 DIAGNOSIS — G8929 Other chronic pain: Secondary | ICD-10-CM | POA: Insufficient documentation

## 2016-09-24 DIAGNOSIS — Z79899 Other long term (current) drug therapy: Secondary | ICD-10-CM | POA: Insufficient documentation

## 2016-09-24 DIAGNOSIS — M545 Low back pain: Secondary | ICD-10-CM | POA: Insufficient documentation

## 2016-09-24 DIAGNOSIS — R432 Parageusia: Secondary | ICD-10-CM | POA: Insufficient documentation

## 2016-09-24 LAB — COMPREHENSIVE METABOLIC PANEL
ALT: 28 U/L (ref 17–63)
ANION GAP: 7 (ref 5–15)
AST: 20 U/L (ref 15–41)
Albumin: 3.6 g/dL (ref 3.5–5.0)
Alkaline Phosphatase: 77 U/L (ref 38–126)
BILIRUBIN TOTAL: 0.5 mg/dL (ref 0.3–1.2)
BUN: 7 mg/dL (ref 6–20)
CO2: 25 mmol/L (ref 22–32)
Calcium: 9 mg/dL (ref 8.9–10.3)
Chloride: 103 mmol/L (ref 101–111)
Creatinine, Ser: 1.05 mg/dL (ref 0.61–1.24)
Glucose, Bld: 118 mg/dL — ABNORMAL HIGH (ref 65–99)
POTASSIUM: 4.1 mmol/L (ref 3.5–5.1)
Sodium: 135 mmol/L (ref 135–145)
TOTAL PROTEIN: 7.7 g/dL (ref 6.5–8.1)

## 2016-09-24 LAB — CBC
HEMATOCRIT: 46 % (ref 39.0–52.0)
HEMOGLOBIN: 16.5 g/dL (ref 13.0–17.0)
MCH: 30.3 pg (ref 26.0–34.0)
MCHC: 35.9 g/dL (ref 30.0–36.0)
MCV: 84.6 fL (ref 78.0–100.0)
Platelets: 246 10*3/uL (ref 150–400)
RBC: 5.44 MIL/uL (ref 4.22–5.81)
RDW: 13.4 % (ref 11.5–15.5)
WBC: 8.1 10*3/uL (ref 4.0–10.5)

## 2016-09-24 LAB — LIPASE, BLOOD: Lipase: 31 U/L (ref 11–51)

## 2016-09-24 MED ORDER — ONDANSETRON HCL 4 MG/2ML IJ SOLN
4.0000 mg | Freq: Once | INTRAMUSCULAR | Status: AC
  Administered 2016-09-24: 4 mg via INTRAVENOUS
  Filled 2016-09-24: qty 2

## 2016-09-24 MED ORDER — IOPAMIDOL (ISOVUE-300) INJECTION 61%
INTRAVENOUS | Status: AC
  Administered 2016-09-24: 100 mL
  Filled 2016-09-24: qty 100

## 2016-09-24 MED ORDER — MORPHINE SULFATE (PF) 4 MG/ML IV SOLN
4.0000 mg | Freq: Once | INTRAVENOUS | Status: AC
  Administered 2016-09-24: 4 mg via INTRAVENOUS
  Filled 2016-09-24: qty 1

## 2016-09-24 MED ORDER — FAMOTIDINE 20 MG PO TABS
20.0000 mg | ORAL_TABLET | ORAL | Status: AC
  Administered 2016-09-24: 20 mg via ORAL
  Filled 2016-09-24: qty 1

## 2016-09-24 MED ORDER — SODIUM CHLORIDE 0.9 % IV BOLUS (SEPSIS)
1000.0000 mL | Freq: Once | INTRAVENOUS | Status: AC
  Administered 2016-09-24: 1000 mL via INTRAVENOUS

## 2016-09-24 NOTE — ED Provider Notes (Signed)
[facility] DEPT Provider Note   CSN: [id number] Arrival date & time: 09/24/16  1027     History   Chief Complaint Chief Complaint  Patient presents with  . Nausea    HPI JOHNATHA ZEIDMAN is a 41 y.o. male.  HPI   Pt with hx HTN p/w decreased appetite, abdominal tightness and "weirdness," metallic taste in his mouth, altered taste.  Has had loose "and normal" bowel movements recently, none today.  Does also admit to poor dentition but denies any change.  Denies fevers, CP, SOB, cough, urinary symptoms.  Denies weakness or numbness in his face or extremities, any difficulty swallowing.  Denies nasal congestion, sinus pain, facial swelling, ear pain.     Past Medical History:  Diagnosis Date  . Hypertension 2003   dx age 71   . Wears glasses     Patient Active Problem List   Diagnosis Date Noted  . Subcutaneous nodules 07/09/2016  . Acute left-sided thoracic back pain 02/16/2016  . Left inguinal hernia 12/12/2015  . Poor dentition 05/27/2015  . Periodontitis 05/27/2015  . Pain in left wrist 05/27/2015  . HTN (hypertension) 05/12/2015    Past Surgical History:  Procedure Laterality Date  . CHOLECYSTECTOMY         Home Medications    Prior to Admission medications   Medication Sig Start Date End Date Taking? Authorizing Provider  acetaminophen-codeine (TYLENOL #3) 300-30 MG tablet Take 1 tablet by mouth every 8 (eight) hours as needed. 09/06/16   Funches, Adriana Mccallum, MD  amLODipine (NORVASC) 10 MG tablet Take 1 tablet (10 mg total) by mouth daily. 07/09/16   Funches, Adriana Mccallum, MD  carvedilol (COREG) 12.5 MG tablet Take 1 tablet (12.5 mg total) by mouth 2 (two) times daily with a meal. 09/06/16   Funches, Adriana Mccallum, MD  spironolactone (ALDACTONE) 50 MG tablet Take 1 tablet (50 mg total) by mouth daily. 09/06/16   Boykin Nearing, MD    Family History Family History  Problem Relation Age of Onset  . Cancer Mother     Social History Social History  Substance Use  Topics  . Smoking status: Former Smoker    Packs/day: 1.00    Types: Cigars    Quit date: 05/10/2015  . Smokeless tobacco: Never Used  . Alcohol use Yes     Comment: occ     Allergies   Penicillins   Review of Systems Review of Systems  All other systems reviewed and are negative.    Physical Exam Updated Vital Signs BP (!) 145/104   Pulse 63   Temp 97.9 F (36.6 C) (Oral)   Resp 15   SpO2 96%   Physical Exam  Constitutional: He appears well-developed and well-nourished. No distress.  HENT:  Head: Normocephalic and atraumatic.  Right Ear: Tympanic membrane and ear canal normal.  Left Ear: Tympanic membrane and ear canal normal.  Nose: Right sinus exhibits no maxillary sinus tenderness and no frontal sinus tenderness. Left sinus exhibits no maxillary sinus tenderness and no frontal sinus tenderness.  Mouth/Throat: Oropharynx is clear and moist. Dental caries present. No dental abscesses. No oropharyngeal exudate, posterior oropharyngeal edema, posterior oropharyngeal erythema or tonsillar abscesses.  Widespread severe dental decay   Neck: Neck supple.  Cardiovascular: Normal rate and regular rhythm.   Pulmonary/Chest: Effort normal and breath sounds normal. No respiratory distress. He has no wheezes. He has no rales.  Abdominal: Soft. He exhibits no distension and no mass. There is tenderness in the left lower quadrant. There  is no rebound and no guarding.  Neurological: He is alert. No cranial nerve deficit. He exhibits normal muscle tone.  Skin: He is not diaphoretic.  Nursing note and vitals reviewed.    ED Treatments / Results  Labs (all labs ordered are listed, but only abnormal results are displayed) Labs Reviewed  COMPREHENSIVE METABOLIC PANEL - Abnormal; Notable for the following:       Result Value   Glucose, Bld 118 (*)    All other components within normal limits  LIPASE, BLOOD  CBC    EKG  EKG Interpretation None       Radiology Dg Lumbar  Spine 2-3 Views  Result Date: 09/24/2016 CLINICAL DATA:  Low back pain.  No known injury . EXAM: LUMBAR SPINE - 2-3 VIEW COMPARISON:  CT 05/19/2010. FINDINGS: Surgical clips right upper quadrant. Mild scoliosis concave right. Diffuse multilevel degenerative change. Minimal T11 compression, unchanged. No acute bony abnormality identified. No evidence of fracture. IMPRESSION: No acute abnormality identified. Diffuse degenerative change. Minimal T11 chronic compression, no change from prior exam. Electronically Signed   By: Dauphin   On: 09/24/2016 11:50   Ct Abdomen Pelvis W Contrast  Result Date: 09/24/2016 CLINICAL DATA:  Wilson abdominal pain since last night. EXAM: CT ABDOMEN AND PELVIS WITH CONTRAST TECHNIQUE: Multidetector CT imaging of the abdomen and pelvis was performed using the standard protocol following bolus administration of intravenous contrast. CONTRAST:  174mL ISOVUE-300 IOPAMIDOL (ISOVUE-300) INJECTION 61% COMPARISON:  None. FINDINGS: Lower chest: No acute abnormality. Hepatobiliary: Diffuse low attenuation of the liver as can be seen with hepatic steatosis. Prior cholecystectomy. No intrahepatic or extrahepatic biliary ductal dilatation. No focal hepatic mass. Pancreas: Unremarkable. No pancreatic ductal dilatation or surrounding inflammatory changes. Spleen: Normal in size without focal abnormality. Adrenals/Urinary Tract: Adrenal glands are unremarkable. Mild left upper pole renal cortical atrophy. Small adjacent 7 mm cyst in the left upper pole. Kidneys are otherwise normal, without renal calculi, focal lesion, or hydronephrosis. Bladder is unremarkable. Stomach/Bowel: Stomach is within normal limits. Appendix appears normal. No evidence of bowel wall thickening, distention, or inflammatory changes. Vascular/Lymphatic: No significant vascular findings are present. No enlarged abdominal or pelvic lymph nodes. Reproductive: Prostate is unremarkable. Other: Small fat containing left  inguinal hernia. No fluid collection or hematoma. Musculoskeletal: No acute osseous abnormality. No lytic or sclerotic osseous lesion. Mild degenerative disc disease with disc height loss that L4-5. IMPRESSION: 1. No acute abdominal or pelvic pathology. 2. Small fat containing left inguinal hernia. 3. Hepatic steatosis. Electronically Signed   By: Kathreen Devoid   On: 09/24/2016 14:29    Procedures Procedures (including critical care time)  Medications Ordered in ED Medications  ondansetron (ZOFRAN) injection 4 mg (4 mg Intravenous Given 09/24/16 1258)  sodium chloride 0.9 % bolus 1,000 mL (0 mLs Intravenous Stopped 09/24/16 1357)  morphine 4 MG/ML injection 4 mg (4 mg Intravenous Given 09/24/16 1258)  iopamidol (ISOVUE-300) 61 % injection (100 mLs  Contrast Given 09/24/16 1403)  famotidine (PEPCID) tablet 20 mg (20 mg Oral Given 09/24/16 1456)     Initial Impression / Assessment and Plan / ED Course  I have reviewed the triage vital signs and the nursing notes.  Pertinent labs & imaging results that were available during my care of the patient were reviewed by me and considered in my medical decision making (see chart for details).     Afebrile, nontoxic patient with altered sense of taste, abdominal pain, decreased appetite.   Labs unremarkable.  CT demonstrates  LLQ hernia without complication.  Cranial nerves intact.  Pt has dental decay but denies any new dental pain, no obvious abscess on exam.  No noted nasal or ear infection.  No definitive reflux symptoms.  Discussed pt with Dr Laverta Baltimore.   D/C home with neurology, PCP follow up.  Discussed result, findings, treatment, and follow up  with patient.  Pt given return precautions.  Pt verbalizes understanding and agrees with plan.       Final Clinical Impressions(s) / ED Diagnoses   Final diagnoses:  Altered taste    New Prescriptions Discharge Medication List as of 09/24/2016  3:47 PM       Clayton Bibles, Hershal Coria 09/24/16 1713    Long,  Wonda Olds, MD 09/25/16 [phone]

## 2016-09-24 NOTE — ED Notes (Signed)
ED Provider at bedside. 

## 2016-09-24 NOTE — Discharge Instructions (Signed)
Read the information below.  You may return to the Emergency Department at any time for worsening condition or any new symptoms that concern you.   Make sure to eat and drink to stay hydrated.  Follow up with neurology and/or primary care provider for further evaluation.

## 2016-09-24 NOTE — ED Notes (Signed)
Pt given crackers and water for po challenge 

## 2016-09-24 NOTE — ED Triage Notes (Signed)
To ED for eval of nausea feeling with a 'metal taste' in mouth since last night. No vomiting. "Just don't feel right". Appears in nad.

## 2016-10-01 ENCOUNTER — Ambulatory Visit: Payer: Self-pay | Attending: Family Medicine | Admitting: Physician Assistant

## 2016-10-01 ENCOUNTER — Telehealth: Payer: Self-pay

## 2016-10-01 VITALS — BP 151/101 | HR 78 | Temp 98.0°F | Ht 74.5 in | Wt 221.6 lb

## 2016-10-01 DIAGNOSIS — G8929 Other chronic pain: Secondary | ICD-10-CM | POA: Insufficient documentation

## 2016-10-01 DIAGNOSIS — I10 Essential (primary) hypertension: Secondary | ICD-10-CM | POA: Insufficient documentation

## 2016-10-01 DIAGNOSIS — Z79899 Other long term (current) drug therapy: Secondary | ICD-10-CM | POA: Insufficient documentation

## 2016-10-01 DIAGNOSIS — R1310 Dysphagia, unspecified: Secondary | ICD-10-CM

## 2016-10-01 DIAGNOSIS — R438 Other disturbances of smell and taste: Secondary | ICD-10-CM

## 2016-10-01 NOTE — Progress Notes (Signed)
Chief Complaint: "ED follow up"  Subjective: This is a 41 year old male that is followed here for hypertension and chronic back pain. He presented to the emergency department on 09/24/2016 with decreased appetite, difficulty swallowing, and foods and liquids tasting funny, like metal. He had been expansion his symptoms 2-3 days prior to his presentation. He also had some abdominal cramping and softer stools. No recent medication changes. He doesn't remember an out to eat experience that may have caused this. No recent virus or sinus infection that he recalls. He does admit to poor dentition. His workup in the emergency department was unrevealing. He did have a CT scan of his abdomen and pelvis which showed no acute process. His labs looked okay. He was given symptom relief and sent home for further follow-up here.  He states that 3-4 days later all of his symptoms subsided. He's not experience of recurrence. He still doesn't know exactly what happened. He did not take his morning blood pressure medications.   ROS: benign today GEN: denies fever or chills, denies change in weight Skin: denies lesions or rashes HEENT: denies headache, earache, epistaxis, sore throat, or neck pain LUNGS: denies SHOB, dyspnea, PND, orthopnea CV: denies CP or palpitations ABD: denies abd pain, N or V EXT: denies muscle spasms or swelling; no pain in lower ext, no weakness NEURO: denies numbness or tingling, denies sz, stroke or TIA   Objective:  Vitals:   10/01/16 1423  BP: (!) 151/101  Pulse: 78  Temp: 98 F (36.7 C)  TempSrc: Oral  SpO2: 97%  Weight: 221 lb 9.6 oz (100.5 kg)  Height: 6' 2.5" (1.892 m)    Physical Exam:  General: in no acute distress. HEENT: no pallor, no icterus, moist oral mucosa, no JVD, no lymphadenopathy Heart: Normal  s1 &s2  Regular rate and rhythm, without murmurs, rubs, gallops. Lungs: Clear to auscultation bilaterally. Abdomen: Soft, nontender, nondistended, positive  bowel sounds. Extremities: No clubbing cyanosis or edema with positive pedal pulses. Neuro: Alert, awake, oriented x3, nonfocal.   Medications: Prior to Admission medications   Medication Sig Start Date End Date Taking? Authorizing Provider  amLODipine (NORVASC) 10 MG tablet Take 1 tablet (10 mg total) by mouth daily. 07/09/16  Yes Funches, Adriana Mccallum, MD  carvedilol (COREG) 12.5 MG tablet Take 1 tablet (12.5 mg total) by mouth 2 (two) times daily with a meal. 09/06/16  Yes Funches, Josalyn, MD  spironolactone (ALDACTONE) 50 MG tablet Take 1 tablet (50 mg total) by mouth daily. 09/06/16  Yes Funches, Josalyn, MD  acetaminophen-codeine (TYLENOL #3) 300-30 MG tablet Take 1 tablet by mouth every 8 (eight) hours as needed. Patient not taking: Reported on 10/01/2016 09/06/16   Boykin Nearing, MD    Assessment: 1. Metallic taste/dysphagia-resolved  Plan: Cont to watch for reocurrence No meds or labs today  Follow up:as scheduled/switching to Dr. Wynetta Emery  The patient was given clear instructions to go to ER or return to medical center if symptoms don't improve, worsen or new problems develop. The patient verbalized understanding. The patient was told to call to get lab results if they haven't heard anything in the next week.   This note has been created with Surveyor, quantity. Any transcriptional errors are unintentional.   Zettie Pho, PA-C 10/01/2016, 2:47 PM

## 2016-10-01 NOTE — Telephone Encounter (Signed)
Pt was called and a VM was left informing pt to return phone call for lab results. If pt returns phone call please inform pt:Marland Kitchen    Low back x-ray  Reveals diffuse degenerative changes and minimal T11 compression this is longstanding and unchanged

## 2016-10-31 ENCOUNTER — Emergency Department (HOSPITAL_COMMUNITY)
Admission: EM | Admit: 2016-10-31 | Discharge: 2016-10-31 | Disposition: A | Discharge status: Home / Self Care | Payer: Self-pay | Attending: Emergency Medicine | Admitting: Emergency Medicine

## 2016-10-31 ENCOUNTER — Emergency Department (HOSPITAL_COMMUNITY): Payer: Self-pay

## 2016-10-31 ENCOUNTER — Encounter (HOSPITAL_COMMUNITY): Payer: Self-pay

## 2016-10-31 DIAGNOSIS — Z79899 Other long term (current) drug therapy: Secondary | ICD-10-CM | POA: Insufficient documentation

## 2016-10-31 DIAGNOSIS — R072 Precordial pain: Secondary | ICD-10-CM | POA: Insufficient documentation

## 2016-10-31 DIAGNOSIS — Z87891 Personal history of nicotine dependence: Secondary | ICD-10-CM | POA: Insufficient documentation

## 2016-10-31 DIAGNOSIS — I10 Essential (primary) hypertension: Secondary | ICD-10-CM | POA: Insufficient documentation

## 2016-10-31 LAB — CBC
HCT: 41.9 % (ref 39.0–52.0)
HEMOGLOBIN: 15.2 g/dL (ref 13.0–17.0)
MCH: 30.3 pg (ref 26.0–34.0)
MCHC: 36.3 g/dL — AB (ref 30.0–36.0)
MCV: 83.5 fL (ref 78.0–100.0)
Platelets: 290 10*3/uL (ref 150–400)
RBC: 5.02 MIL/uL (ref 4.22–5.81)
RDW: 13.1 % (ref 11.5–15.5)
WBC: 7.3 10*3/uL (ref 4.0–10.5)

## 2016-10-31 LAB — BASIC METABOLIC PANEL
ANION GAP: 7 (ref 5–15)
BUN: 10 mg/dL (ref 6–20)
CALCIUM: 8.9 mg/dL (ref 8.9–10.3)
CO2: 24 mmol/L (ref 22–32)
Chloride: 106 mmol/L (ref 101–111)
Creatinine, Ser: 0.93 mg/dL (ref 0.61–1.24)
GFR calc Af Amer: >60 mL/min (ref >60)
GLUCOSE: 109 mg/dL — AB (ref 65–99)
Potassium: 3.6 mmol/L (ref 3.5–5.1)
SODIUM: 137 mmol/L (ref 135–145)

## 2016-10-31 LAB — D-DIMER, QUANTITATIVE: D-Dimer, Quant: <0.27 ug/mL-FEU (ref 0.00–0.50)

## 2016-10-31 LAB — I-STAT TROPONIN, ED: TROPONIN I, POC: 0 ng/mL (ref 0.00–0.08)

## 2016-10-31 NOTE — ED Provider Notes (Signed)
[facility] DEPT Provider Note   CSN: [id number] Arrival date & time: 10/31/16  0744     History   Chief Complaint Chief Complaint  Patient presents with  . Chest Pain    HPI STYLES FAMBRO is a 41 y.o. male.  Patient with a complaint of chest pain on and off for the past 6 days. But present for most of each day. It's left side of the chest. Worse with a deep breath. Not associated with shortness of breath. Made a little bit worse by certain movements. No dizziness no syncope no nausea or vomiting. No history of any prior similar chest pain or cardiac problems.      Past Medical History:  Diagnosis Date  . Hypertension 2003   dx age 36   . Wears glasses     Patient Active Problem List   Diagnosis Date Noted  . Subcutaneous nodules 07/09/2016  . Acute left-sided thoracic back pain 02/16/2016  . Left inguinal hernia 12/12/2015  . Poor dentition 05/27/2015  . Periodontitis 05/27/2015  . Pain in left wrist 05/27/2015  . HTN (hypertension) 05/12/2015    Past Surgical History:  Procedure Laterality Date  . CHOLECYSTECTOMY         Home Medications    Prior to Admission medications   Medication Sig Start Date End Date Taking? Authorizing Provider  amLODipine (NORVASC) 10 MG tablet Take 1 tablet (10 mg total) by mouth daily. 07/09/16  Yes Funches, Adriana Mccallum, MD  carvedilol (COREG) 12.5 MG tablet Take 1 tablet (12.5 mg total) by mouth 2 (two) times daily with a meal. 09/06/16  Yes Funches, Josalyn, MD  spironolactone (ALDACTONE) 50 MG tablet Take 1 tablet (50 mg total) by mouth daily. 09/06/16  Yes Funches, Josalyn, MD  acetaminophen-codeine (TYLENOL #3) 300-30 MG tablet Take 1 tablet by mouth every 8 (eight) hours as needed. Patient not taking: Reported on 10/01/2016 09/06/16   Boykin Nearing, MD    Family History Family History  Problem Relation Age of Onset  . Cancer Mother     Social History Social History  Substance Use Topics  . Smoking status: Former  Smoker    Packs/day: 1.00    Types: Cigars    Quit date: 05/10/2015  . Smokeless tobacco: Never Used  . Alcohol use Yes     Comment: occ     Allergies   Penicillins   Review of Systems Review of Systems  Constitutional: Negative for fever.  HENT: Negative for congestion.   Eyes: Negative for visual disturbance.  Respiratory: Negative for shortness of breath.   Cardiovascular: Positive for chest pain. Negative for leg swelling.  Gastrointestinal: Negative for abdominal pain.  Genitourinary: Negative for dysuria.  Musculoskeletal: Negative for back pain.  Skin: Negative for rash.  Neurological: Negative for headaches.  Hematological: Does not bruise/bleed easily.  Psychiatric/Behavioral: Negative for confusion.     Physical Exam Updated Vital Signs BP (!) 147/98   Pulse 66   Temp 98.4 F (36.9 C) (Oral)   Resp 16   Ht 1.892 m (6' 2.5")   Wt 101.2 kg (223 lb)   SpO2 98%   BMI 28.25 kg/m   Physical Exam  Constitutional: He is oriented to person, place, and time. He appears well-developed and well-nourished. No distress.  HENT:  Head: Normocephalic and atraumatic.  Mouth/Throat: Oropharynx is clear and moist.  Eyes: Pupils are equal, round, and reactive to light. Conjunctivae and EOM are normal.  Neck: Normal range of motion. Neck supple.  Cardiovascular:  Normal rate, regular rhythm and normal heart sounds.   Pulmonary/Chest: Effort normal and breath sounds normal. No respiratory distress.  Abdominal: Soft. Bowel sounds are normal. There is no tenderness.  Musculoskeletal: Normal range of motion. He exhibits no edema.  Neurological: He is alert and oriented to person, place, and time. No sensory deficit. He exhibits normal muscle tone. Coordination normal.  Skin: Skin is warm.  Nursing note and vitals reviewed.    ED Treatments / Results  Labs (all labs ordered are listed, but only abnormal results are displayed) Labs Reviewed  BASIC METABOLIC PANEL -  Abnormal; Notable for the following:       Result Value   Glucose, Bld 109 (*)    All other components within normal limits  CBC - Abnormal; Notable for the following:    MCHC 36.3 (*)    All other components within normal limits  D-DIMER, QUANTITATIVE (NOT AT [facility])  I-STAT TROPONIN, ED    EKG  EKG Interpretation  Date/Time:  Wednesday October 31 2016 07:57:52 EDT Ventricular Rate:  73 PR Interval:    QRS Duration: 95 QT Interval:  382 QTC Calculation: 418 R Axis:   63 Text Interpretation:  Sinus rhythm Prolonged PR interval Probable left atrial enlargement Nonspecific T abnormalities, inferior leads ST elev, probable normal early repol pattern Baseline wander in lead(s) V2 Confirmed by Fredia Sorrow [phone]) on 10/31/2016 8:17:39 AM       Radiology Dg Chest 2 View  Result Date: 10/31/2016 CLINICAL DATA:  41 year old male with 6 days of mid chest pain. EXAM: CHEST  2 VIEW COMPARISON:  05/07/2015 and earlier. FINDINGS: Mildly lower lung volumes. Cardiac and mediastinal contours are stable and within normal limits. Visualized tracheal air column is within normal limits. The lungs remain clear. No pneumothorax or pleural effusion. Stable cholecystectomy clips. Negative visible bowel gas pattern. No acute osseous abnormality identified. IMPRESSION: Negative.  No acute cardiopulmonary abnormality. Electronically Signed   By: Genevie Ann M.D.   On: 10/31/2016 08:28    Procedures Procedures (including critical care time)  Medications Ordered in ED Medications - No data to display   Initial Impression / Assessment and Plan / ED Course  I have reviewed the triage vital signs and the nursing notes.  Pertinent labs & imaging results that were available during my care of the patient were reviewed by me and considered in my medical decision making (see chart for details).     Workup for the chest pain without any acute findings. Patient's duration of the chest pain has been several days.  Very reassuring that troponin was negative. Also d-dimer was negative chest x-ray negative. Suspect this may be chest wall pain in nature. However he does have a history of hypertension. So he was given referral to follow-up with cardiology for consideration for possible stress test. Patient also started on baby aspirin a day.  Final Clinical Impressions(s) / ED Diagnoses   Final diagnoses:  Precordial pain    New Prescriptions New Prescriptions   No medications on file     Fredia Sorrow, MD 10/31/16 1700

## 2016-10-31 NOTE — Discharge Instructions (Signed)
Make an appointment to follow-up with cardiology. Start taking a baby aspirin a day. Return for any new or worse symptoms. A compliment to follow-up with your primary care doctor about the high blood pressure. Work note provided.

## 2016-10-31 NOTE — ED Triage Notes (Signed)
Patient c/o consistent left chest pain x 6 days Patient reports that chest pain is worse with a deep breath. Patient denies SOB, weakness, diaphoresis, or dizziness.

## 2016-11-02 MED FILL — ?CARVEDILOL 12.5 MG TABLET: 12.5 | 30 days supply | Qty: 60 | Fill #0

## 2016-11-02 MED FILL — AMLODIPINE BESYLATE 10 MG T: 10 | 30 days supply | Qty: 30 | Fill #2

## 2016-11-07 ENCOUNTER — Other Ambulatory Visit: Payer: Self-pay | Admitting: Internal Medicine

## 2016-11-08 ENCOUNTER — Other Ambulatory Visit: Payer: Self-pay | Admitting: Family Medicine

## 2016-11-08 ENCOUNTER — Ambulatory Visit: Payer: Self-pay | Attending: Internal Medicine | Admitting: Internal Medicine

## 2016-11-08 ENCOUNTER — Encounter: Payer: Self-pay | Admitting: Internal Medicine

## 2016-11-08 VITALS — BP 183/148 | HR 76 | Temp 98.4°F | Resp 16 | Wt 228.2 lb

## 2016-11-08 DIAGNOSIS — M546 Pain in thoracic spine: Secondary | ICD-10-CM | POA: Insufficient documentation

## 2016-11-08 DIAGNOSIS — K0530 Chronic periodontitis, unspecified: Secondary | ICD-10-CM | POA: Insufficient documentation

## 2016-11-08 DIAGNOSIS — K4090 Unilateral inguinal hernia, without obstruction or gangrene, not specified as recurrent: Secondary | ICD-10-CM | POA: Insufficient documentation

## 2016-11-08 DIAGNOSIS — M545 Low back pain: Secondary | ICD-10-CM | POA: Insufficient documentation

## 2016-11-08 DIAGNOSIS — M25532 Pain in left wrist: Secondary | ICD-10-CM | POA: Insufficient documentation

## 2016-11-08 DIAGNOSIS — R21 Rash and other nonspecific skin eruption: Secondary | ICD-10-CM

## 2016-11-08 DIAGNOSIS — R079 Chest pain, unspecified: Secondary | ICD-10-CM

## 2016-11-08 DIAGNOSIS — F17200 Nicotine dependence, unspecified, uncomplicated: Secondary | ICD-10-CM | POA: Insufficient documentation

## 2016-11-08 DIAGNOSIS — G8929 Other chronic pain: Secondary | ICD-10-CM

## 2016-11-08 DIAGNOSIS — R0789 Other chest pain: Secondary | ICD-10-CM | POA: Insufficient documentation

## 2016-11-08 DIAGNOSIS — I10 Essential (primary) hypertension: Secondary | ICD-10-CM

## 2016-11-08 MED ORDER — CYCLOBENZAPRINE HCL 5 MG PO TABS: 5.0000 mg | ORAL_TABLET | Freq: Two times a day (BID) | ORAL | 1 refills | Status: DC | PRN

## 2016-11-08 MED ORDER — DICLOFENAC SODIUM 1 % TD GEL: 2.0000 g | Freq: Four times a day (QID) | TRANSDERMAL | 1 refills | Status: DC

## 2016-11-08 MED ORDER — AMLODIPINE BESYLATE 10 MG PO TABS: 10.0000 mg | ORAL_TABLET | Freq: Every day | ORAL | 5 refills | Status: DC

## 2016-11-08 MED ORDER — SPIRONOLACTONE 50 MG PO TABS: 50.0000 mg | ORAL_TABLET | Freq: Every day | ORAL | 5 refills | Status: DC

## 2016-11-08 MED ORDER — CARVEDILOL 12.5 MG PO TABS: 12.5000 mg | ORAL_TABLET | Freq: Two times a day (BID) | ORAL | 2 refills | Status: DC

## 2016-11-08 MED ORDER — NYSTATIN 100000 UNIT/GM EX POWD: Freq: Two times a day (BID) | CUTANEOUS | 1 refills | Status: DC

## 2016-11-08 MED ORDER — CLONIDINE HCL 0.1 MG PO TABS
0.2000 mg | ORAL_TABLET | Freq: Once | ORAL | Status: AC
  Administered 2016-11-08: 0.2 mg via ORAL

## 2016-11-08 MED FILL — SPIRONOLACTONE 50 MG TAB: 50 | 30 days supply | Qty: 30 | Fill #2

## 2016-11-08 NOTE — Progress Notes (Signed)
Patient ID: [name], male    DOB: Jan 18, 1976  MRN: [id number]  CC: re-establish and Hypertension   Subjective: [name] is a 41 y.o. male who presents for chronic ds management. His concerns today include:  Hx of HTN and chronic LBP  1.HTN: Out of meds (Norvasc, Coreg and Spironolactone) x 5 days. Has RF but just did not have time to get to pharmacy early to pick up RFs -BP elevated today in clinic Denies CP, SOB, blurred vision or HA at this time  2. Seen in ER recently for LT sided CP. -initial troponin neg. EKG with TWI inf leads that look old. D-dimer negative  -pain intermittently x 3 wks. Last episode was last evening [facility]; last few hrs. No radiation. No SOB. Worse when he takes a deep breath at times. No recent long distance travel or LE edema -worse also when moving around or using arm Smoke occasionally - cigars 0-2 a day -no FHx of heart disease  - feels pain started when he started new job earlier this mth. working 12 hrs shift. Stands for 12 hrs and some lifting no more than 40 lbs.    2. Skin upper inner thigh darker and itches intermittently x 10 yrs -"gets bumpy if I stretch" -use to use baby powder with corn starch  3.  Back better  Ruptured disc 2006 -intermittent flair ups. Flair when he started new job that involves a lot of standing.  Little better now that he has gotten use to it. Patient Active Problem List   Diagnosis Date Noted  . Subcutaneous nodules 07/09/2016  . Acute left-sided thoracic back pain 02/16/2016  . Left inguinal hernia 12/12/2015  . Poor dentition 05/27/2015  . Periodontitis 05/27/2015  . Pain in left wrist 05/27/2015  . HTN (hypertension) 05/12/2015     Current Outpatient Prescriptions on File Prior to Visit  Medication Sig Dispense Refill  . [DISCONTINUED] promethazine (PHENERGAN) 25 MG tablet Take 1 tablet (25 mg total) by mouth every 6 (six) hours as needed for nausea or vomiting. (Patient not taking: Reported  on 06/22/2014) 12 tablet 0   No current facility-administered medications on file prior to visit.     Allergies  Allergen Reactions  . Penicillins Swelling and Rash    Has patient had a PCN reaction causing immediate rash, facial/tongue/throat swelling, SOB or lightheadedness with hypotension: Yes/No:Yes Has patient had a PCN reaction causing severe rash involving mucus membranes or skin necrosis:no Has patient had a PCN reaction that required hospitalization Yes Has patient had a PCN reaction occurring within the last 10 years:No If all of the above answers are "NO", then may proceed with Cephalospori    Social History   Social History  . Marital status: Single    Spouse name: N/A  . Number of children: N/A  . Years of education: N/A   Occupational History  . Not on file.   Social History Main Topics  . Smoking status: Former Smoker    Packs/day: 1.00    Types: Cigars    Quit date: 05/10/2015  . Smokeless tobacco: Never Used  . Alcohol use Yes     Comment: occ  . Drug use: No  . Sexual activity: Not on file   Other Topics Concern  . Not on file   Social History Narrative  . No narrative on file    Family History  Problem Relation Age of Onset  . Cancer Mother     Past Surgical  History:  Procedure Laterality Date  . CHOLECYSTECTOMY      ROS: Review of Systems Neg except as above  PHYSICAL EXAM: BP (!) 183/148   Pulse 76   Temp 98.4 F (36.9 C) (Oral)   Resp 16   Wt 228 lb 3.2 oz (103.5 kg)   SpO2 98%   BMI 28.91 kg/m   170/118 Physical Exam General appearance - alert, well appearing, and in no distress Mental status - alert, oriented to person, place, and time, normal mood, behavior, speech, dress, motor activity, and thought processes Neck - supple, no significant adenopathy Chest - clear to auscultation, no wheezes, rales or rhonchi, symmetric air entry Heart - normal rate, regular rhythm, normal S1, S2, no murmurs, rubs, clicks or  gallops Extremities - peripheral pulses normal, no pedal edema, no clubbing or cyanosis Skin -hyperpigmented, slightly scaly appearance to upper inner thigh BL close to groin   ASSESSMENT AND PLAN: 1. Essential hypertension -given initial BP reading today, one time dose of Clonidine given with repeat BP 170/118 -recommend compliance with meds and filling rxns on time. I write rxn for 3 mths supply. 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 Given - cloNIDine (CATAPRES) tablet 0.2 mg; Take 2 tablets (0.2 mg total) by mouth once. - carvedilol (COREG) 12.5 MG tablet; Take 1 tablet (12.5 mg total) by mouth 2 (two) times daily with a meal.  Dispense: 180 tablet; Refill: 2 - amLODipine (NORVASC) 10 MG tablet; Take 1 tablet (10 mg total) by mouth daily.  Dispense: 90 tablet; Refill: 5 - spironolactone (ALDACTONE) 50 MG tablet; Take 1 tablet (50 mg total) by mouth daily.  Dispense: 90 tablet; Refill: 5  2. Chronic low back pain, unspecified back pain laterality, with sciatica presence unspecified -recommend stretches. - diclofenac sodium (VOLTAREN) 1 % GEL; Apply 2 g topically 4 (four) times daily.  Dispense: 100 g; Refill: 1 - cyclobenzaprine (FLEXERIL) 5 MG tablet; Take 1 tablet (5 mg total) by mouth 2 (two) times daily as needed for muscle spasms.  Dispense: 30 tablet; Refill: 1  3. Rash of groin - nystatin (MYCOSTATIN/NYSTOP) powder; Apply topically 2 (two) times daily.  Dispense: 30 g; Refill: 1  4. Chest pain, unspecified type -EKG from ER reviewed. Pt with risk factors. Will refer to cardiology Advised to quit smoking  Patient was given the opportunity to ask questions.  Patient verbalized understanding of the plan and was able to repeat key elements of the plan.   Orders Placed This Encounter  Procedures  . Ambulatory referral to Cardiology     Requested Prescriptions    Signed Prescriptions Disp Refills  . carvedilol (COREG) 12.5 MG tablet 180 tablet 2    Sig: Take 1 tablet (12.5 mg total) by mouth 2 (two) times daily with a meal.  . amLODipine (NORVASC) 10 MG tablet 90 tablet 5    Sig: Take 1 tablet (10 mg total) by mouth daily.  Marland Kitchen spironolactone (ALDACTONE) 50 MG tablet 90 tablet 5    Sig: Take 1 tablet (50 mg total) by mouth daily.  Marland Kitchen nystatin (MYCOSTATIN/NYSTOP) powder 30 g 1    Sig: Apply topically 2 (two) times daily.  . diclofenac sodium (VOLTAREN) 1 % GEL 100 g 1    Sig: Apply 2 g topically 4 (four) times daily.  . cyclobenzaprine (FLEXERIL) 5 MG tablet 30 tablet 1    Sig: Take  1 tablet (5 mg total) by mouth 2 (two) times daily as needed for muscle spasms.    No Follow-up on file.  Karle Plumber, MD, FACP

## 2016-11-08 NOTE — Patient Instructions (Signed)
Give appointment with Stacy in 1 week for BP check.

## 2016-11-09 MED FILL — CYCLOBENZAPRINE 5 MG TABLET: 5 | 15 days supply | Qty: 30 | Fill #0

## 2016-11-09 MED FILL — VOLTAREN 1% GEL: 1 | 12 days supply | Qty: 100 | Fill #0

## 2016-11-09 MED FILL — NYSTOP 100,000 UNITS/GM PWD: 100000 | 15 days supply | Qty: 60 | Fill #0

## 2016-11-21 ENCOUNTER — Encounter: Payer: Self-pay | Admitting: Pharmacist

## 2016-11-21 NOTE — Progress Notes (Deleted)
   S:    Patient arrives ***.    Presents to the clinic for hypertension evaluation. Patient was referred on 11/08/16 by Dr. Wynetta Emery.  Patient was last seen by Primary Care Provider on 11/08/16.   Patient {Actions; denies-reports:120008} adherence with medications. Patient had not taken medications when last seen in office.  Current BP Medications include:  Spironolactone 50 mg daily, amlodipine 10 mg daily, carvedilol 12.5 mg BID  Antihypertensives tried in the past include: HCTZ, lisinopril (stopped due to noncompliance)  Dietary habits include:  O:   Last 3 Office BP readings: BP Readings from Last 3 Encounters:  11/08/16 (!) 183/148  10/31/16 (!) 162/108  10/01/16 (!) 151/101    BMET    Component Value Date/Time   NA 137 10/31/2016 0821   NA 140 09/06/2016 1025   K 3.6 10/31/2016 0821   CL 106 10/31/2016 0821   CO2 24 10/31/2016 0821   GLUCOSE 109 (H) 10/31/2016 0821   BUN 10 10/31/2016 0821   BUN 10 09/06/2016 1025   CREATININE 0.93 10/31/2016 0821   CREATININE 0.97 12/12/2015 1306   CALCIUM 8.9 10/31/2016 0821   GFRNONAA >60 10/31/2016 0821   GFRNONAA >89 12/12/2015 1306   GFRAA >60 10/31/2016 0821   GFRAA >89 12/12/2015 1306    A/P: Hypertension longstanding/newly diagnosed currently *** on current medications.  {Meds adjust:18428} ***.   Results reviewed and written information provided.   Total time in face-to-face counseling *** minutes.   F/U Clinic Visit with Dr. Marland Kitchen  Patient seen with ***

## 2016-11-27 ENCOUNTER — Encounter: Payer: Self-pay | Admitting: Pharmacist

## 2016-12-07 ENCOUNTER — Ambulatory Visit: Payer: Self-pay | Attending: Internal Medicine | Admitting: Internal Medicine

## 2016-12-07 ENCOUNTER — Encounter: Payer: Self-pay | Admitting: Internal Medicine

## 2016-12-07 VITALS — BP 132/88 | HR 77 | Temp 98.3°F | Resp 16 | Wt 222.0 lb

## 2016-12-07 DIAGNOSIS — Z720 Tobacco use: Secondary | ICD-10-CM

## 2016-12-07 DIAGNOSIS — Z87891 Personal history of nicotine dependence: Secondary | ICD-10-CM | POA: Insufficient documentation

## 2016-12-07 DIAGNOSIS — K4090 Unilateral inguinal hernia, without obstruction or gangrene, not specified as recurrent: Secondary | ICD-10-CM | POA: Insufficient documentation

## 2016-12-07 DIAGNOSIS — M2550 Pain in unspecified joint: Secondary | ICD-10-CM | POA: Insufficient documentation

## 2016-12-07 DIAGNOSIS — M47816 Spondylosis without myelopathy or radiculopathy, lumbar region: Secondary | ICD-10-CM | POA: Insufficient documentation

## 2016-12-07 DIAGNOSIS — Z880 Allergy status to penicillin: Secondary | ICD-10-CM | POA: Insufficient documentation

## 2016-12-07 DIAGNOSIS — Z809 Family history of malignant neoplasm, unspecified: Secondary | ICD-10-CM | POA: Insufficient documentation

## 2016-12-07 DIAGNOSIS — R229 Localized swelling, mass and lump, unspecified: Secondary | ICD-10-CM | POA: Insufficient documentation

## 2016-12-07 DIAGNOSIS — Z9049 Acquired absence of other specified parts of digestive tract: Secondary | ICD-10-CM | POA: Insufficient documentation

## 2016-12-07 DIAGNOSIS — I10 Essential (primary) hypertension: Secondary | ICD-10-CM | POA: Insufficient documentation

## 2016-12-07 NOTE — Patient Instructions (Signed)
Please give patient forms to apply for [facility] discount.  Let me know when you have been approved for orange card so that we can refer back to surgeon for inguinal hernia.    Back Exercises If you have pain in your back, do these exercises 2-3 times each day or as told by your doctor. When the pain goes away, do the exercises once each day, but repeat the steps more times for each exercise (do more repetitions). If you do not have pain in your back, do these exercises once each day or as told by your doctor. Exercises Single Knee to Chest  Do these steps 3-5 times in a row for each leg: 1. Lie on your back on a firm bed or the floor with your legs stretched out. 2. Bring one knee to your chest. 3. Hold your knee to your chest by grabbing your knee or thigh. 4. Pull on your knee until you feel a gentle stretch in your lower back. 5. Keep doing the stretch for 10-30 seconds. 6. Slowly let go of your leg and straighten it.  Pelvic Tilt  Do these steps 5-10 times in a row: 1. Lie on your back on a firm bed or the floor with your legs stretched out. 2. Bend your knees so they point up to the ceiling. Your feet should be flat on the floor. 3. Tighten your lower belly (abdomen) muscles to press your lower back against the floor. This will make your tailbone point up to the ceiling instead of pointing down to your feet or the floor. 4. Stay in this position for 5-10 seconds while you gently tighten your muscles and breathe evenly.  Cat-Cow  Do these steps until your lower back bends more easily: 1. Get on your hands and knees on a firm surface. Keep your hands under your shoulders, and keep your knees under your hips. You may put padding under your knees. 2. Let your head hang down, and make your tailbone point down to the floor so your lower back is round like the back of a cat. 3. Stay in this position for 5 seconds. 4. Slowly lift your head and make your tailbone point up to the  ceiling so your back hangs low (sags) like the back of a cow. 5. Stay in this position for 5 seconds.  Press-Ups  Do these steps 5-10 times in a row: 1. Lie on your belly (face-down) on the floor. 2. Place your hands near your head, about shoulder-width apart. 3. While you keep your back relaxed and keep your hips on the floor, slowly straighten your arms to raise the top half of your body and lift your shoulders. Do not use your back muscles. To make yourself more comfortable, you may change where you place your hands. 4. Stay in this position for 5 seconds. 5. Slowly return to lying flat on the floor.  Bridges  Do these steps 10 times in a row: 1. Lie on your back on a firm surface. 2. Bend your knees so they point up to the ceiling. Your feet should be flat on the floor. 3. Tighten your butt muscles and lift your butt off of the floor until your waist is almost as high as your knees. If you do not feel the muscles working in your butt and the back of your thighs, slide your feet 1-2 inches farther away from your butt. 4. Stay in this position for 3-5 seconds. 5. Slowly lower your  butt to the floor, and let your butt muscles relax.  If this exercise is too easy, try doing it with your arms crossed over your chest. Belly Crunches  Do these steps 5-10 times in a row: 1. Lie on your back on a firm bed or the floor with your legs stretched out. 2. Bend your knees so they point up to the ceiling. Your feet should be flat on the floor. 3. Cross your arms over your chest. 4. Tip your chin a little bit toward your chest but do not bend your neck. 5. Tighten your belly muscles and slowly raise your chest just enough to lift your shoulder blades a tiny bit off of the floor. 6. Slowly lower your chest and your head to the floor.  Back Lifts Do these steps 5-10 times in a row: 1. Lie on your belly (face-down) with your arms at your sides, and rest your forehead on the floor. 2. Tighten the  muscles in your legs and your butt. 3. Slowly lift your chest off of the floor while you keep your hips on the floor. Keep the back of your head in line with the curve in your back. Look at the floor while you do this. 4. Stay in this position for 3-5 seconds. 5. Slowly lower your chest and your face to the floor.  Contact a doctor if:  Your back pain gets a lot worse when you do an exercise.  Your back pain does not lessen 2 hours after you exercise. If you have any of these problems, stop doing the exercises. Do not do them again unless your doctor says it is okay. Get help right away if:  You have sudden, very bad back pain. If this happens, stop doing the exercises. Do not do them again unless your doctor says it is okay. This information is not intended to replace advice given to you by your health care provider. Make sure you discuss any questions you have with your health care provider. Document Released: 03/31/2010 Document Revised: 08/04/2015 Document Reviewed: 04/22/2014 Elsevier Interactive Patient Education  [employer].

## 2016-12-07 NOTE — Progress Notes (Signed)
Patient ID: [name], male    DOB: 10/03/1975  MRN: [id number]  CC: Follow-up   Subjective: [name] is a 41 y.o. male who presents for f/u BP His concerns today include:  Hx of HTN and chronic LBP  1. Dealing with a lot of body pain that is worse in the mornings. "Feel like I have arthritis." -Stiffness in back -sometimes he feels soreness in  LT leg below buttock "like pull a ham string." -does not play any contact sports. Went to gym a few times but not recently. -no jt swellings Does a lot of standing at work but not lifting -RT side of back swollen 2 days ago  2 .has several knots under skin at different locations x several yrs. Has inquired of several physicians a reason for this but no definitive answers to date -no fever or night sweats, no wgh changes. Appetite good.   3. Small LT inguinal hernia noted on CT abd/pelvis 09/2016 that was done at ER visit for GI symptoms. He reports being referred to gen surgeon for repair but could not afford. Thinks hernia has grown  Soc: drinks occasion Smoke cigars about 2 a day. Trying to taper off.  Patient Active Problem List   Diagnosis Date Noted  . Subcutaneous nodules 07/09/2016  . Acute left-sided thoracic back pain 02/16/2016  . Left inguinal hernia 12/12/2015  . Poor dentition 05/27/2015  . Periodontitis 05/27/2015  . Pain in left wrist 05/27/2015  . HTN (hypertension) 05/12/2015     Current Outpatient Prescriptions on File Prior to Visit  Medication Sig Dispense Refill  . amLODipine (NORVASC) 10 MG tablet Take 1 tablet (10 mg total) by mouth daily. 90 tablet 5  . carvedilol (COREG) 12.5 MG tablet Take 1 tablet (12.5 mg total) by mouth 2 (two) times daily with a meal. 180 tablet 2  . cyclobenzaprine (FLEXERIL) 5 MG tablet Take 1 tablet (5 mg total) by mouth 2 (two) times daily as needed for muscle spasms. 30 tablet 1  . diclofenac sodium (VOLTAREN) 1 % GEL Apply 2 g topically 4 (four) times daily. 100 g 1    . nystatin (MYCOSTATIN/NYSTOP) powder Apply topically 2 (two) times daily. 30 g 1  . spironolactone (ALDACTONE) 50 MG tablet Take 1 tablet (50 mg total) by mouth daily. 90 tablet 5  . [DISCONTINUED] promethazine (PHENERGAN) 25 MG tablet Take 1 tablet (25 mg total) by mouth every 6 (six) hours as needed for nausea or vomiting. (Patient not taking: Reported on 06/22/2014) 12 tablet 0   No current facility-administered medications on file prior to visit.     Allergies  Allergen Reactions  . Penicillins Swelling and Rash    Has patient had a PCN reaction causing immediate rash, facial/tongue/throat swelling, SOB or lightheadedness with hypotension: Yes/No:[id number] Yes Has patient had a PCN reaction causing severe rash involving mucus membranes or skin necrosis: Yes/No:[id number] No Has patient had a PCN reaction that required hospitalization Yes/No:[id number] Yes Has patient had a PCN reaction occurring within the last 10 years: Yes/No:[id number] No If all of the above answers are "NO", then may proceed with Cephalospori    Social History   Social History  . Marital status: Single    Spouse name: N/A  . Number of children: N/A  . Years of education: N/A   Occupational History  . Not on file.   Social History Main Topics  . Smoking status: Former Smoker    Packs/day: 1.00    Types: Cigars    Quit  date: 05/10/2015  . Smokeless tobacco: Never Used  . Alcohol use Yes     Comment: occ  . Drug use: No  . Sexual activity: Not on file   Other Topics Concern  . Not on file   Social History Narrative  . No narrative on file    Family History  Problem Relation Age of Onset  . Cancer Mother     Past Surgical History:  Procedure Laterality Date  . CHOLECYSTECTOMY      ROS: Review of Systems Neg except as stated above PHYSICAL EXAM: BP 132/88   Pulse 77   Temp 98.3 F (36.8 C) (Oral)   Resp 16   Wt 222 lb (100.7 kg)   SpO2 98%   BMI 28.12 kg/m   Wt Readings from Last 3  Encounters:  12/07/16 222 lb (100.7 kg)  11/08/16 228 lb 3.2 oz (103.5 kg)  10/31/16 223 lb (101.2 kg)   Physical Exam General appearance - alert, well appearing, and in no distress Mental status - alert, oriented to person, place, and time, normal mood, behavior, speech, dress, motor activity, and thought processes Nose - normal and patent, no erythema, discharge or polyps Mouth - mucous membranes moist, pharynx normal without lesions Neck - supple, no significant adenopathy Chest - clear to auscultation, no wheezes, rales or rhonchi, symmetric air entry Heart - normal rate, regular rhythm, normal S1, S2, no murmurs, rubs, clicks or gallops Neck: cervical, axillary or inguinal LN Abdomen - soft, nontender, nondistended, no masses or organomegaly 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 Several on arms MSK: no jt enlargement of large jts.   X-ray of LS spine IMPRESSION: No acute abnormality identified. Diffuse degenerative change. Minimal T11 chronic compression, no change from prior exam. ASSESSMENT AND PLAN: 1. Arthralgia, unspecified joint -no signs of active inflammation of large and small jts - Sedimentation Rate - Rheumatoid factor  2. Inguinal hernia of left side without obstruction or gangrene -avoid heavy lifting.  Pt to apply for OC and can refer in future if still an issue  3. Essential hypertension -at goal Consider change Spironolactone on future visit as can cause gynecomastia in males  4. Tobacco use Patient advised to quit smoking. Discussed health risks associated with smoking including lung and other types of cancers, chronic lung diseases and CV risks.. Pt wil try to quit  5.  Localized swelling, mass and lump, multiple sites - Ambulatory referral to Dermatology  6. Spondylosis of lumbar region without myelopathy or radiculopathy Given stretch exercise for back  Patient was given the opportunity to ask questions.  Patient verbalized understanding of the plan and was able to repeat key elements of the plan.   Orders Placed This Encounter  Procedures  . Sedimentation Rate  . Rheumatoid factor  . Ambulatory referral to Dermatology     Requested Prescriptions    No prescriptions requested or ordered in this encounter    Return in about 4 months (around 04/08/2017).  Karle Plumber, MD, FACP

## 2016-12-08 DIAGNOSIS — R229 Localized swelling, mass and lump, unspecified: Secondary | ICD-10-CM | POA: Insufficient documentation

## 2016-12-08 DIAGNOSIS — K4090 Unilateral inguinal hernia, without obstruction or gangrene, not specified as recurrent: Secondary | ICD-10-CM | POA: Insufficient documentation

## 2016-12-08 DIAGNOSIS — Z87891 Personal history of nicotine dependence: Secondary | ICD-10-CM | POA: Insufficient documentation

## 2016-12-08 DIAGNOSIS — Z720 Tobacco use: Secondary | ICD-10-CM | POA: Insufficient documentation

## 2016-12-08 LAB — RHEUMATOID FACTOR: Rhuematoid fact SerPl-aCnc: 16 IU/mL — ABNORMAL HIGH (ref 0.0–13.9)

## 2016-12-08 LAB — SEDIMENTATION RATE: Sed Rate: 21 mm/hr — ABNORMAL HIGH (ref 0–15)

## 2016-12-20 ENCOUNTER — Telehealth: Payer: Self-pay

## 2016-12-20 NOTE — Telephone Encounter (Signed)
Contacted pt to go over lab results pt is aware of results and doesn't have any questions

## 2017-01-21 ENCOUNTER — Ambulatory Visit: Payer: Self-pay | Admitting: Internal Medicine

## 2017-01-21 ENCOUNTER — Ambulatory Visit: Payer: Self-pay | Attending: Internal Medicine | Admitting: Internal Medicine

## 2017-01-21 ENCOUNTER — Encounter: Payer: Self-pay | Admitting: Internal Medicine

## 2017-01-21 VITALS — BP 187/133 | HR 70 | Temp 97.8°F | Resp 16 | Wt 230.0 lb

## 2017-01-21 DIAGNOSIS — Z87891 Personal history of nicotine dependence: Secondary | ICD-10-CM | POA: Insufficient documentation

## 2017-01-21 DIAGNOSIS — Z880 Allergy status to penicillin: Secondary | ICD-10-CM | POA: Insufficient documentation

## 2017-01-21 DIAGNOSIS — I10 Essential (primary) hypertension: Secondary | ICD-10-CM

## 2017-01-21 DIAGNOSIS — Z79899 Other long term (current) drug therapy: Secondary | ICD-10-CM | POA: Insufficient documentation

## 2017-01-21 DIAGNOSIS — Z9049 Acquired absence of other specified parts of digestive tract: Secondary | ICD-10-CM | POA: Insufficient documentation

## 2017-01-21 DIAGNOSIS — H1031 Unspecified acute conjunctivitis, right eye: Secondary | ICD-10-CM

## 2017-01-21 MED ORDER — CLONIDINE HCL 0.1 MG PO TABS: 0.2000 mg | ORAL_TABLET | Freq: Once | ORAL | Status: DC

## 2017-01-21 MED ORDER — POLYMYXIN B-TRIMETHOPRIM 10000-0.1 UNIT/ML-% OP SOLN: 1.0000 [drp] | Freq: Four times a day (QID) | OPHTHALMIC | 0 refills | Status: DC

## 2017-01-21 MED FILL — POLYMYXIN B/TMP EYE DROPS: 10000-0.1 | 15 days supply | Qty: 10 | Fill #0

## 2017-01-21 NOTE — Patient Instructions (Signed)
Please avoid running out of medications.  Uncontrolled blood pressure puts you at risk for heart attack and stroke.   Use the antibiotic eye drop for 5-7 days.

## 2017-01-21 NOTE — Progress Notes (Signed)
Patient ID: OSHAY STRANAHAN, male    DOB: 05-24-1975  MRN: [id number]  CC: Conjunctivitis (possible)   Subjective: Nicolaos Mitrano is a 41 y.o. male who presents for UC visit His concerns today include:   C/o lacrimation, erythema RT eye since this a.m -little itching but "more pain and discomfort than anything else" -no blurred vision, no photophobia  BP elevated to day. Out of Norvasc and spironolactone times 2 days.  Has a difficult time getting here to pick up refills but plans to pick up today. No chest pains or shortness of breath at this time  Patient Active Problem List   Diagnosis Date Noted  . Inguinal hernia of left side without obstruction or gangrene 12/08/2016  . Tobacco use 12/08/2016  . Localized swelling, mass and lump, multiple sites 12/08/2016  . Subcutaneous nodules 07/09/2016  . Acute left-sided thoracic back pain 02/16/2016  . Left inguinal hernia 12/12/2015  . Poor dentition 05/27/2015  . Periodontitis 05/27/2015  . Pain in left wrist 05/27/2015  . HTN (hypertension) 05/12/2015     Current Outpatient Medications on File Prior to Visit  Medication Sig Dispense Refill  . amLODipine (NORVASC) 10 MG tablet Take 1 tablet (10 mg total) by mouth daily. 90 tablet 5  . carvedilol (COREG) 12.5 MG tablet Take 1 tablet (12.5 mg total) by mouth 2 (two) times daily with a meal. 180 tablet 2  . cyclobenzaprine (FLEXERIL) 5 MG tablet Take 1 tablet (5 mg total) by mouth 2 (two) times daily as needed for muscle spasms. 30 tablet 1  . diclofenac sodium (VOLTAREN) 1 % GEL Apply 2 g topically 4 (four) times daily. 100 g 1  . nystatin (MYCOSTATIN/NYSTOP) powder Apply topically 2 (two) times daily. 30 g 1  . spironolactone (ALDACTONE) 50 MG tablet Take 1 tablet (50 mg total) by mouth daily. 90 tablet 5  . [DISCONTINUED] promethazine (PHENERGAN) 25 MG tablet Take 1 tablet (25 mg total) by mouth every 6 (six) hours as needed for nausea or vomiting. (Patient not taking: Reported  on 06/22/2014) 12 tablet 0   No current facility-administered medications on file prior to visit.     Allergies  Allergen Reactions  . Penicillins Swelling and Rash    Has patient had a PCN reaction causing immediate rash, facial/tongue/throat swelling, SOB or lightheadedness with hypotension: Yes/No:[id number] Yes Has patient had a PCN reaction causing severe rash involving mucus membranes or skin necrosis: Yes/No:[id number] No Has patient had a PCN reaction that required hospitalization Yes/No:[id number] Yes Has patient had a PCN reaction occurring within the last 10 years: Yes/No:[id number] No If all of the above answers are "NO", then may proceed with Cephalospori    Social History   Socioeconomic History  . Marital status: Single    Spouse name: Not on file  . Number of children: Not on file  . Years of education: Not on file  . Highest education level: Not on file  Social Needs  . Financial resource strain: Not on file  . Food insecurity - worry: Not on file  . Food insecurity - inability: Not on file  . Transportation needs - medical: Not on file  . Transportation needs - non-medical: Not on file  Occupational History  . Not on file  Tobacco Use  . Smoking status: Former Smoker    Packs/day: 1.00    Types: Cigars    Last attempt to quit: 05/10/2015    Years since quitting: 1.7  . Smokeless tobacco: Never Used  Substance and Sexual Activity  .  Alcohol use: Yes    Comment: occ  . Drug use: No  . Sexual activity: Not on file  Other Topics Concern  . Not on file  Social History Narrative  . Not on file    Family History  Problem Relation Age of Onset  . Cancer Mother     Past Surgical History:  Procedure Laterality Date  . CHOLECYSTECTOMY      ROS: Review of Systems Negative except as stated above PHYSICAL EXAM: BP (!) 187/133   Pulse 70   Temp 97.8 F (36.6 C) (Oral)   Resp 16   Wt 230 lb (104.3 kg)   SpO2 99%   BMI 29.14 kg/m   Repeat BP:  180/120 Physical Exam General appearance - alert, well appearing, and in no distress Mental status - alert, oriented to person, place, and time, normal mood, behavior, speech, dress, motor activity, and thought processes Eyes -right eye: Light edema of the upper eyelid.  Mild conjunctival injection. Pupils equal and reactive to light.  Extraocular movement intact. chest - clear to auscultation, no wheezes, rales or rhonchi, symmetric air entry Heart - normal rate, regular rhythm, normal S1, S2, no murmurs, rubs, clicks or gallops   ASSESSMENT AND PLAN: 1. Acute conjunctivitis of right eye, unspecified acute conjunctivitis type -warm compresses to the eye - trimethoprim-polymyxin b (POLYTRIM) ophthalmic solution; Place 1 drop every 6 (six) hours into the right eye.  Dispense: 10 mL; Refill: 0 -work excuse given  2. Essential hypertension -uncontrolled Encouraged pt to avoid running out of his medications. Agreeable to changing to 3 mth supply at a time. - cloNIDine (CATAPRES) tablet 0.2 mg   Patient was given the opportunity to ask questions.  Patient verbalized understanding of the plan and was able to repeat key elements of the plan.   No orders of the defined types were placed in this encounter.    Requested Prescriptions   Signed Prescriptions Disp Refills  . trimethoprim-polymyxin b (POLYTRIM) ophthalmic solution 10 mL 0    Sig: Place 1 drop every 6 (six) hours into the right eye.    Return if symptoms worsen or fail to improve.  Karle Plumber, MD, FACP

## 2017-01-29 ENCOUNTER — Encounter: Payer: Self-pay | Admitting: Internal Medicine

## 2017-01-29 ENCOUNTER — Ambulatory Visit: Payer: Self-pay | Attending: Internal Medicine | Admitting: Internal Medicine

## 2017-01-29 VITALS — BP 152/91 | HR 104 | Temp 98.5°F | Resp 18 | Ht 74.0 in | Wt 222.2 lb

## 2017-01-29 DIAGNOSIS — K4090 Unilateral inguinal hernia, without obstruction or gangrene, not specified as recurrent: Secondary | ICD-10-CM | POA: Insufficient documentation

## 2017-01-29 DIAGNOSIS — R0981 Nasal congestion: Secondary | ICD-10-CM | POA: Insufficient documentation

## 2017-01-29 DIAGNOSIS — R05 Cough: Secondary | ICD-10-CM | POA: Insufficient documentation

## 2017-01-29 DIAGNOSIS — I10 Essential (primary) hypertension: Secondary | ICD-10-CM | POA: Insufficient documentation

## 2017-01-29 DIAGNOSIS — Z880 Allergy status to penicillin: Secondary | ICD-10-CM | POA: Insufficient documentation

## 2017-01-29 DIAGNOSIS — B349 Viral infection, unspecified: Secondary | ICD-10-CM | POA: Insufficient documentation

## 2017-01-29 DIAGNOSIS — Z79899 Other long term (current) drug therapy: Secondary | ICD-10-CM | POA: Insufficient documentation

## 2017-01-29 DIAGNOSIS — M546 Pain in thoracic spine: Secondary | ICD-10-CM | POA: Insufficient documentation

## 2017-01-29 DIAGNOSIS — J029 Acute pharyngitis, unspecified: Secondary | ICD-10-CM | POA: Insufficient documentation

## 2017-01-29 MED ORDER — BENZONATATE 100 MG PO CAPS: 100.0000 mg | ORAL_CAPSULE | Freq: Two times a day (BID) | ORAL | 0 refills | Status: DC | PRN

## 2017-01-29 MED FILL — ?SPIRONOLACTONE 50 MG TABLE: 50 | 30 days supply | Qty: 30 | Fill #0

## 2017-01-29 MED FILL — BENZONATATE 100 MG CAPSULE: 100 | 10 days supply | Qty: 20 | Fill #0

## 2017-01-29 MED FILL — AMLODIPINE BESYLATE 10 MG T: 10 | 30 days supply | Qty: 30 | Fill #0

## 2017-01-29 NOTE — Progress Notes (Signed)
Patient ID: MATTHEWS FRANKS, male    DOB: 04-26-1975  MRN: [id number]  CC:  resp symptoms  Subjective:  Lanard Arguijo is a 41 y.o. male who presents for UC visit. His concerns today include:  Hx of HTN and chronic LBP  Pt thinks he has the flu.  Started with sore throat and nasal congestion yesterday -today he vomited once this a.m, had diarrhea of loose stools, body aches, hot and cold chills. Cough productive of brown mucous -has not eaten as yet for today. Did not go to work yesterday or today -Using over-the-counter cold and flu remedies  Blood pressure noted to be elevated.  He did not take blood pressure medicines as yet for today.  Patient Active Problem List   Diagnosis Date Noted  . Inguinal hernia of left side without obstruction or gangrene 12/08/2016  . Tobacco use 12/08/2016  . Localized swelling, mass and lump, multiple sites 12/08/2016  . Subcutaneous nodules 07/09/2016  . Acute left-sided thoracic back pain 02/16/2016  . Left inguinal hernia 12/12/2015  . Poor dentition 05/27/2015  . Periodontitis 05/27/2015  . Pain in left wrist 05/27/2015  . HTN (hypertension) 05/12/2015     Current Outpatient Medications on File Prior to Visit  Medication Sig Dispense Refill  . amLODipine (NORVASC) 10 MG tablet Take 1 tablet (10 mg total) by mouth daily. 90 tablet 5  . carvedilol (COREG) 12.5 MG tablet Take 1 tablet (12.5 mg total) by mouth 2 (two) times daily with a meal. 180 tablet 2  . cyclobenzaprine (FLEXERIL) 5 MG tablet Take 1 tablet (5 mg total) by mouth 2 (two) times daily as needed for muscle spasms. 30 tablet 1  . diclofenac sodium (VOLTAREN) 1 % GEL Apply 2 g topically 4 (four) times daily. 100 g 1  . nystatin (MYCOSTATIN/NYSTOP) powder Apply topically 2 (two) times daily. 30 g 1  . spironolactone (ALDACTONE) 50 MG tablet Take 1 tablet (50 mg total) by mouth daily. 90 tablet 5  . trimethoprim-polymyxin b (POLYTRIM) ophthalmic solution Place 1 drop every 6 (six)  hours into the right eye. 10 mL 0  . [DISCONTINUED] promethazine (PHENERGAN) 25 MG tablet Take 1 tablet (25 mg total) by mouth every 6 (six) hours as needed for nausea or vomiting. (Patient not taking: Reported on 06/22/2014) 12 tablet 0   Current Facility-Administered Medications on File Prior to Visit  Medication Dose Route Frequency Provider Last Rate Last Dose  . cloNIDine (CATAPRES) tablet 0.2 mg  0.2 mg Oral Once Ladell Pier, MD        Allergies  Allergen Reactions  . Penicillins Swelling and Rash    Has patient had a PCN reaction causing immediate rash, facial/tongue/throat swelling, SOB or lightheadedness with hypotension:Yes/No:[id number] Yes Has patient had a PCN reaction causing severe rash involving mucus membranes or skin necrosis: Yes/No:[id number] No Has patient had a PCN reaction that required hospitalization Yes/No:[id number] Yes Has patient had a PCN reaction occurring within the last 10 years: Yes/No:[id number] No If all of the above answers are "NO", then may proceed with Cephalospori     ROS: Review of Systems Neg except as stated above  PHYSICAL EXAM: BP (!) 152/91 (BP Location: Left Arm, Patient Position: Sitting, Cuff Size: Normal)   Pulse (!) 104   Temp 98.5 F (36.9 C) (Oral)   Resp 18   Ht 6\' 2"  (1.88 m)   Wt 222 lb 3.2 oz (100.8 kg)   SpO2 97%   BMI 28.53 kg/m   Repeat pulse 86 Physical Exam  General appearance - pt appears uncomfortable but not toxic looking Mental status - alert, oriented to person, place, and time, normal mood, behavior, speech, dress, motor activity, and thought processes Nose - clear mucous Mouth - mild erythema without exudates of throat Neck - no cervical LN Chest - clear to auscultation, no wheezes, rales or rhonchi, symmetric air entry. Mild audible congestion Heart - normal rate, regular rhythm, normal S1, S2, no murmurs, rubs, clicks or gallops  ASSESSMENT AND PLAN: 1. Acute viral syndrome -recommend that he push fluids.  Use Pepto-Bismol OTC PRN -Tylenol or Ibuprofen PRN for body aches -gargle with warm water and salt for sore throat Tessalon Perles PRN -work excuse given. F/u if no improvement -take BP med when he returns home.   Patient was given the opportunity to ask questions.  Patient verbalized understanding of the plan and was able to repeat key elements of the plan.   No orders of the defined types were placed in this encounter.    Requested Prescriptions   Signed Prescriptions Disp Refills  . benzonatate (TESSALON) 100 MG capsule 20 capsule 0    Sig: Take 1 capsule (100 mg total) by mouth 2 (two) times daily as needed for cough.    Future Appointments  Date Time Provider [place]  02/05/2017  9:45 AM Ladell Pier, MD [facility] None  04/08/2017  2:45 PM Ladell Pier, MD [facility] None    Karle Plumber, MD, Rosalita Chessman

## 2017-01-29 NOTE — Progress Notes (Signed)
Patient complains of a itchy throat beginning yesterday. Patient took alka seltzer theraflu last night for cough relief. Patient reports waking up with a headache which is present at a 7. Patient complains of body aches all over and feeling fatigue. Patient has no taken chronic medications today. Patient complains of sputum of a brown color. Patient has nasal drainage with no coloration.

## 2017-01-29 NOTE — Patient Instructions (Signed)
He should push fluids as discussed. Get some Pepto-Bismol from over-the-counter and use as needed. Use Tylenol or ibuprofen as needed for body aches

## 2017-02-04 ENCOUNTER — Ambulatory Visit: Payer: Self-pay | Attending: Internal Medicine | Admitting: Internal Medicine

## 2017-02-04 ENCOUNTER — Encounter: Payer: Self-pay | Admitting: Internal Medicine

## 2017-02-04 VITALS — BP 131/85 | HR 73 | Temp 97.5°F | Resp 18 | Ht 74.5 in | Wt 226.0 lb

## 2017-02-04 DIAGNOSIS — J349 Unspecified disorder of nose and nasal sinuses: Secondary | ICD-10-CM | POA: Insufficient documentation

## 2017-02-04 DIAGNOSIS — R05 Cough: Secondary | ICD-10-CM | POA: Insufficient documentation

## 2017-02-04 DIAGNOSIS — R490 Dysphonia: Secondary | ICD-10-CM

## 2017-02-04 DIAGNOSIS — Z79899 Other long term (current) drug therapy: Secondary | ICD-10-CM | POA: Insufficient documentation

## 2017-02-04 DIAGNOSIS — J3489 Other specified disorders of nose and nasal sinuses: Secondary | ICD-10-CM

## 2017-02-04 DIAGNOSIS — R220 Localized swelling, mass and lump, head: Secondary | ICD-10-CM

## 2017-02-04 DIAGNOSIS — J40 Bronchitis, not specified as acute or chronic: Secondary | ICD-10-CM

## 2017-02-04 DIAGNOSIS — Z87891 Personal history of nicotine dependence: Secondary | ICD-10-CM | POA: Insufficient documentation

## 2017-02-04 DIAGNOSIS — I10 Essential (primary) hypertension: Secondary | ICD-10-CM | POA: Insufficient documentation

## 2017-02-04 MED ORDER — AZITHROMYCIN 250 MG PO TABS: ORAL_TABLET | ORAL | 0 refills | Status: DC

## 2017-02-04 MED ORDER — BENZONATATE 100 MG PO CAPS: 100.0000 mg | ORAL_CAPSULE | Freq: Two times a day (BID) | ORAL | 0 refills | Status: DC | PRN

## 2017-02-04 NOTE — Progress Notes (Signed)
Patient ID: [name], male    DOB: [birth year]  MRN: [id number]  CC: URI   Subjective: [name] is a 41 y.o. male who presents for UC visit His concerns today include:   Patient seen 01/29/2017 with acute viral illness.  At that time I recommended pushing fluids and taking Tylenol or ibuprofen as needed for body aches.  He was given Ladona Ridgel for cough. -Since then he reports that the hoarseness and cough are worse.  Cough now productive of green phlegm.  He also noted blood tinged mucus when he blows his nose.  shortness of breath for 1 day several days ago.  No fever.  Taking Alka-Seltzer and ibuprofen. -He was not able to go to work on the 23rd and did not go in today  He is requesting referral to ENT for evaluation of nasal mass that was seen on CAT scan back in 2016.  He had mentioned it to previous PCP but he is not sure whether he was referred to ENT. -That CAT scan report is shown below: IMPRESSION: 1. Negative intracranial imaging. 2. 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 Recommend ENT referral for visualization.  Patient Active Problem List   Diagnosis Date Noted  . Inguinal hernia of left side without obstruction or gangrene 12/08/2016  . Tobacco use 12/08/2016  . Localized swelling, mass and lump, multiple sites 12/08/2016  . Subcutaneous nodules 07/09/2016  . Acute left-sided thoracic back pain 02/16/2016  . Left inguinal hernia 12/12/2015  . Poor dentition 05/27/2015  . Periodontitis 05/27/2015  . Pain in left wrist 05/27/2015  . HTN (hypertension) 05/12/2015     Current Outpatient Medications on File Prior to Visit  Medication Sig Dispense Refill  . amLODipine (NORVASC) 10 MG tablet Take 1 tablet (10 mg total) by mouth daily. 90 tablet 5  . benzonatate (TESSALON) 100 MG capsule Take 1 capsule (100 mg total) by mouth 2 (two) times daily as needed for cough. 20 capsule 0  . carvedilol (COREG) 12.5 MG tablet Take  1 tablet (12.5 mg total) by mouth 2 (two) times daily with a meal. 180 tablet 2  . cyclobenzaprine (FLEXERIL) 5 MG tablet Take 1 tablet (5 mg total) by mouth 2 (two) times daily as needed for muscle spasms. 30 tablet 1  . diclofenac sodium (VOLTAREN) 1 % GEL Apply 2 g topically 4 (four) times daily. 100 g 1  . nystatin (MYCOSTATIN/NYSTOP) powder Apply topically 2 (two) times daily. 30 g 1  . spironolactone (ALDACTONE) 50 MG tablet Take 1 tablet (50 mg total) by mouth daily. 90 tablet 5  . trimethoprim-polymyxin b (POLYTRIM) ophthalmic solution Place 1 drop every 6 (six) hours into the right eye. 10 mL 0  . [DISCONTINUED] promethazine (PHENERGAN) 25 MG tablet Take 1 tablet (25 mg total) by mouth every 6 (six) hours as needed for nausea or vomiting. (Patient not taking: Reported on 06/22/2014) 12 tablet 0   Current Facility-Administered Medications on File Prior to Visit  Medication Dose Route Frequency Provider Last Rate Last Dose  . cloNIDine (CATAPRES) tablet 0.2 mg  0.2 mg Oral Once Ladell Pier, MD          Social History   Socioeconomic History  . Marital status: Single    Spouse name: Not on file  . Number of children: Not on file  . Years of education: Not on file  . Highest education level: Not on file  Social Needs  .  Financial resource strain: Not on file  . Food insecurity - worry: Not on file  . Food insecurity - inability: Not on file  . Transportation needs - medical: Not on file  . Transportation needs - non-medical: Not on file  Occupational History  . Not on file  Tobacco Use  . Smoking status: Former Smoker    Packs/day: 1.00    Types: Cigars    Last attempt to quit: 05/10/2015    Years since quitting: 1.7  . Smokeless tobacco: Never Used  Substance and Sexual Activity  . Alcohol use: Yes    Comment: occ  . Drug use: No  . Sexual activity: Not on file  Other Topics Concern  . Not on file  Social History Narrative  . Not on file    Family History    Problem Relation Age of Onset  . Cancer Mother     Past Surgical History:  Procedure Laterality Date  . CHOLECYSTECTOMY      ROS: Review of Systems Negative except as stated above PHYSICAL EXAM: BP 131/85 (BP Location: Left Arm, Patient Position: Sitting, Cuff Size: Normal)   Pulse 73   Temp (!) 97.5 F (36.4 C) (Oral)   Resp 18   Ht 6' 2.5" (1.892 m)   Wt 226 lb (102.5 kg)   SpO2 99%   BMI 28.63 kg/m   Wt Readings from Last 3 Encounters:  02/04/17 226 lb (102.5 kg)  01/29/17 222 lb 3.2 oz (100.8 kg)  01/21/17 230 lb (104.3 kg)    Physical Exam  General appearance - alert, well appearing, and in no distress.  Mild audible congestion and hoarseness Mental status - alert, oriented to person, place, and time, normal mood, behavior, speech, dress, motor activity, and thought processes Nose -right nostril patent.  Moderate enlargement of nasal turbinate on the left side Mouth -tonsils enlarged without exudates Neck - supple, no significant adenopathy Chest - clear to auscultation, no wheezes, rales or rhonchi, symmetric air entry Heart - normal rate, regular rhythm, normal S1, S2, no murmurs, rubs, clicks or gallops   ASSESSMENT AND PLAN: 1. Bronchitis -I think this is still viral but given persistence of symptoms will put him on Z-Pak.  Continue Tessalon Perles as needed.  Drink warm fluids to help with hoarseness.  Can use Coricidin HBP over-the-counter for congestion. Work excuse given.  Patient would like to return to work on the 29th. - azithromycin (ZITHROMAX) 250 MG tablet; 2 tabs PO x 1 then 1 tab po daily  Dispense: 6 tablet; Refill: 0 - benzonatate (TESSALON) 100 MG capsule; Take 1 capsule (100 mg total) by mouth 2 (two) times daily as needed for cough.  Dispense: 30 capsule; Refill: 0  2. Hoarseness See above  3. Nasal cavity mass - Ambulatory referral to ENT   Patient was given the opportunity to ask questions.  Patient verbalized understanding of the  plan and was able to repeat key elements of the plan.   No orders of the defined types were placed in this encounter.    Requested Prescriptions    No prescriptions requested or ordered in this encounter    F/u PRN Karle Plumber, MD, Rosalita Chessman

## 2017-02-04 NOTE — Patient Instructions (Signed)
You can purchase and use Coricidin HBP from over the counter.   I have referred you to ENT specialist.

## 2017-02-05 ENCOUNTER — Ambulatory Visit: Payer: Self-pay | Admitting: Internal Medicine

## 2017-02-05 MED FILL — AZITHROMYCIN 250 MG TABLET: 250 | 5 days supply | Qty: 6 | Fill #0

## 2017-02-05 MED FILL — BENZONATATE 100 MG CAPSULE: 100 | 15 days supply | Qty: 30 | Fill #0

## 2017-02-06 ENCOUNTER — Telehealth: Payer: Self-pay | Admitting: Internal Medicine

## 2017-02-06 NOTE — Telephone Encounter (Signed)
Pt. Called requesting a letter form his PCP stating that he is unable to work for the rest of the week. Pt. States that he still does not feel good. Please f/u   Phone: [phone]  [phone]

## 2017-02-07 NOTE — Telephone Encounter (Signed)
Pt called to check the statu of his request, please need the letter for todayt before 4pm please call him [phone]

## 2017-02-11 ENCOUNTER — Encounter: Payer: Self-pay | Admitting: Internal Medicine

## 2017-02-11 ENCOUNTER — Ambulatory Visit: Payer: Self-pay | Attending: Internal Medicine | Admitting: Internal Medicine

## 2017-02-11 ENCOUNTER — Ambulatory Visit (HOSPITAL_COMMUNITY)
Admission: RE | Admit: 2017-02-11 | Discharge: 2017-02-11 | Disposition: A | Discharge status: Home / Self Care | Payer: Self-pay | Source: Ambulatory Visit | Attending: Internal Medicine | Admitting: Internal Medicine

## 2017-02-11 ENCOUNTER — Telehealth: Payer: Self-pay | Admitting: Internal Medicine

## 2017-02-11 VITALS — BP 171/86 | HR 88 | Temp 98.2°F | Resp 18 | Ht 74.0 in | Wt 222.0 lb

## 2017-02-11 DIAGNOSIS — R053 Chronic cough: Secondary | ICD-10-CM

## 2017-02-11 DIAGNOSIS — Z87891 Personal history of nicotine dependence: Secondary | ICD-10-CM | POA: Insufficient documentation

## 2017-02-11 DIAGNOSIS — R05 Cough: Secondary | ICD-10-CM | POA: Insufficient documentation

## 2017-02-11 DIAGNOSIS — I10 Essential (primary) hypertension: Secondary | ICD-10-CM | POA: Insufficient documentation

## 2017-02-11 DIAGNOSIS — Z9049 Acquired absence of other specified parts of digestive tract: Secondary | ICD-10-CM | POA: Insufficient documentation

## 2017-02-11 DIAGNOSIS — J40 Bronchitis, not specified as acute or chronic: Secondary | ICD-10-CM | POA: Insufficient documentation

## 2017-02-11 DIAGNOSIS — Z79899 Other long term (current) drug therapy: Secondary | ICD-10-CM | POA: Insufficient documentation

## 2017-02-11 MED ORDER — HYDROCOD POLST-CPM POLST ER 10-8 MG/5ML PO SUER: 5.0000 mL | Freq: Two times a day (BID) | ORAL | 0 refills | Status: DC | PRN

## 2017-02-11 NOTE — Patient Instructions (Signed)
You have a bronchitis (most likely viral in cause) that is lingering. Some of your symptoms like the hoarsiness and congestion are better. The cough can linger for several weeks.   Take the Tussionex as needed for the cough. It can cause some drowsiness.   If you are unable to afford the cough syrup, you can take a tea spone of honey twice a day for the cough.   Please go to [facility] radiology today for x-ray.

## 2017-02-11 NOTE — Telephone Encounter (Signed)
PC placed to pt today. Pt informed that x-ray did not reveal any changes to suggest pneumonia.

## 2017-02-11 NOTE — Progress Notes (Signed)
Patient ID: [name], male    DOB: 1975/10/12  MRN: [id number]  CC: URI   Subjective: [name] is a 41 y.o. male who presents for UC. His concerns today include:   This is this is the third visit for this patient presenting with continued respiratory symptoms. -Last seen 02/04/2017.  We treated him for bronchitis with Z-Pak and Tessalon Perles.  We kept him out of work. -Today he reports that he completed abx but feels worse. -coughed all last night, mostly dry. -no fever. Felt warm and some chills.  -hoarsiness resolved and congestion better. Using Alk Cold Plus and Theraflu.   Patient Active Problem List   Diagnosis Date Noted  . Inguinal hernia of left side without obstruction or gangrene 12/08/2016  . Tobacco use 12/08/2016  . Localized swelling, mass and lump, multiple sites 12/08/2016  . Subcutaneous nodules 07/09/2016  . Acute left-sided thoracic back pain 02/16/2016  . Left inguinal hernia 12/12/2015  . Poor dentition 05/27/2015  . Periodontitis 05/27/2015  . Pain in left wrist 05/27/2015  . HTN (hypertension) 05/12/2015     Current Outpatient Medications on File Prior to Visit  Medication Sig Dispense Refill  . amLODipine (NORVASC) 10 MG tablet Take 1 tablet (10 mg total) by mouth daily. 90 tablet 5  . benzonatate (TESSALON) 100 MG capsule Take 1 capsule (100 mg total) by mouth 2 (two) times daily as needed for cough. 30 capsule 0  . carvedilol (COREG) 12.5 MG tablet Take 1 tablet (12.5 mg total) by mouth 2 (two) times daily with a meal. 180 tablet 2  . cyclobenzaprine (FLEXERIL) 5 MG tablet Take 1 tablet (5 mg total) by mouth 2 (two) times daily as needed for muscle spasms. 30 tablet 1  . diclofenac sodium (VOLTAREN) 1 % GEL Apply 2 g topically 4 (four) times daily. 100 g 1  . nystatin (MYCOSTATIN/NYSTOP) powder Apply topically 2 (two) times daily. 30 g 1  . spironolactone (ALDACTONE) 50 MG tablet Take 1 tablet (50 mg total) by mouth daily. 90 tablet 5   . trimethoprim-polymyxin b (POLYTRIM) ophthalmic solution Place 1 drop every 6 (six) hours into the right eye. 10 mL 0  . [DISCONTINUED] promethazine (PHENERGAN) 25 MG tablet Take 1 tablet (25 mg total) by mouth every 6 (six) hours as needed for nausea or vomiting. (Patient not taking: Reported on 06/22/2014) 12 tablet 0   Current Facility-Administered Medications on File Prior to Visit  Medication Dose Route Frequency Provider Last Rate Last Dose  . cloNIDine (CATAPRES) tablet 0.2 mg  0.2 mg Oral Once Ladell Pier, MD        Allergies  Allergen Reactions  . Penicillins Swelling and Rash    Has patient had a PCN reaction causing immediate rash, facial/tongue/throat swelling, SOB or lightheadedness with hypotension: YesYes Has patient had a PCN reaction causing severe rash involving mucus membranes or skin necrosis: NoNo Has patient had a PCN reaction that required hospitalization YesYes Has patient had a PCN reaction occurring within the last 10 years: NoNo If all of the above answers are "NO", then may proceed with Cephalospori    Social History   Socioeconomic History  . Marital status: Single    Spouse name: Not on file  . Number of children: Not on file  . Years of education: Not on file  . Highest education level: Not on file  Social Needs  . Financial resource strain: Not on file  . Food insecurity - worry: Not  on file  . Food insecurity - inability: Not on file  . Transportation needs - medical: Not on file  . Transportation needs - non-medical: Not on file  Occupational History  . Not on file  Tobacco Use  . Smoking status: Former Smoker    Packs/day: 1.00    Types: Cigars    Last attempt to quit: 05/10/2015    Years since quitting: 1.7  . Smokeless tobacco: Never Used  Substance and Sexual Activity  . Alcohol use: Yes    Comment: occ  . Drug use: No  . Sexual activity: Not on file  Other Topics Concern  . Not on file  Social History Narrative  . Not on  file    Family History  Problem Relation Age of Onset  . Cancer Mother     Past Surgical History:  Procedure Laterality Date  . CHOLECYSTECTOMY      ROS: Review of Systems As above PHYSICAL EXAM: BP (!) 171/86 (BP Location: Left Arm, Patient Position: Sitting, Cuff Size: Large)   Pulse 88   Temp 98.2 F (36.8 C) (Oral)   Resp 18   Ht '6\' 2"'  (1.88 m)   Wt 222 lb (100.7 kg)   SpO2 99%   BMI 28.50 kg/m   Physical Exam General appearance -pt in NAD. Mild rhinorrhea noted.  Hoarseness has resolved.  Mild audible congestion.   Mental status - alert and orineted Mouth -throat without erythema or exudate Neck - supple, no significant adenopathy Chest - clear to auscultation, no wheezes, rales or rhonchi, symmetric air entry Heart - normal rate, regular rhythm, normal S1, S2, no murmurs, rubs, clicks or gallops   ASSESSMENT AND PLAN: 1. Bronchitis 2. Persistent cough -pt advised that it can take several wks for the cough to resolve. His chest sounds clear. If pertussis, we have already given macrolide abx. Will do CXR Tussionex PRN. Work excuse distended to end of this wk - DG Chest 2 View; Future - chlorpheniramine-HYDROcodone (TUSSIONEX PENNKINETIC ER) 10-8 MG/5ML SUER; Take 5 mLs by mouth every 12 (twelve) hours as needed for cough.  Dispense: 100 mL; Refill: 0  Patient was given the opportunity to ask questions.  Patient verbalized understanding of the plan and was able to repeat key elements of the plan.   No orders of the defined types were placed in this encounter.    Requested Prescriptions    No prescriptions requested or ordered in this encounter    F/u PRN Karle Plumber, MD, Rosalita Chessman

## 2017-03-22 ENCOUNTER — Telehealth: Payer: Self-pay | Admitting: Internal Medicine

## 2017-03-22 DIAGNOSIS — I10 Essential (primary) hypertension: Secondary | ICD-10-CM

## 2017-03-22 NOTE — Telephone Encounter (Signed)
Pt called to get a refill for spironolactone (ALDACTONE) 50 MG tablet carvedilol (COREG) 12.5 MG tablet  amLODipine (NORVASC) 10 MG tablet Please sent it to [facility] pharmacy Please follow up

## 2017-03-25 MED ORDER — SPIRONOLACTONE 50 MG PO TABS: 50.0000 mg | ORAL_TABLET | Freq: Every day | ORAL | 0 refills | Status: DC

## 2017-03-25 MED ORDER — AMLODIPINE BESYLATE 10 MG PO TABS: 10.0000 mg | ORAL_TABLET | Freq: Every day | ORAL | 0 refills | Status: DC

## 2017-03-25 MED ORDER — CARVEDILOL 12.5 MG PO TABS: 12.5000 mg | ORAL_TABLET | Freq: Two times a day (BID) | ORAL | 0 refills | Status: DC

## 2017-03-25 NOTE — Telephone Encounter (Signed)
Refilled

## 2017-04-08 ENCOUNTER — Ambulatory Visit: Payer: Self-pay | Admitting: Internal Medicine

## 2017-04-16 ENCOUNTER — Ambulatory Visit: Payer: Self-pay | Attending: Internal Medicine | Admitting: Internal Medicine

## 2017-04-16 ENCOUNTER — Encounter: Payer: Self-pay | Admitting: Internal Medicine

## 2017-04-16 VITALS — BP 160/120 | HR 70 | Temp 98.1°F | Resp 16 | Wt 232.0 lb

## 2017-04-16 DIAGNOSIS — Z87891 Personal history of nicotine dependence: Secondary | ICD-10-CM | POA: Insufficient documentation

## 2017-04-16 DIAGNOSIS — Z79899 Other long term (current) drug therapy: Secondary | ICD-10-CM | POA: Insufficient documentation

## 2017-04-16 DIAGNOSIS — D492 Neoplasm of unspecified behavior of bone, soft tissue, and skin: Secondary | ICD-10-CM

## 2017-04-16 DIAGNOSIS — Z880 Allergy status to penicillin: Secondary | ICD-10-CM | POA: Insufficient documentation

## 2017-04-16 DIAGNOSIS — R194 Change in bowel habit: Secondary | ICD-10-CM

## 2017-04-16 DIAGNOSIS — D485 Neoplasm of uncertain behavior of skin: Secondary | ICD-10-CM | POA: Insufficient documentation

## 2017-04-16 DIAGNOSIS — R197 Diarrhea, unspecified: Secondary | ICD-10-CM | POA: Insufficient documentation

## 2017-04-16 DIAGNOSIS — Z9049 Acquired absence of other specified parts of digestive tract: Secondary | ICD-10-CM | POA: Insufficient documentation

## 2017-04-16 DIAGNOSIS — K1379 Other lesions of oral mucosa: Secondary | ICD-10-CM | POA: Insufficient documentation

## 2017-04-16 DIAGNOSIS — K089 Disorder of teeth and supporting structures, unspecified: Secondary | ICD-10-CM

## 2017-04-16 DIAGNOSIS — I10 Essential (primary) hypertension: Secondary | ICD-10-CM | POA: Insufficient documentation

## 2017-04-16 MED ORDER — LIDOCAINE VISCOUS 2 % MT SOLN: OROMUCOSAL | 0 refills | Status: DC

## 2017-04-16 MED ORDER — CLONIDINE HCL 0.1 MG PO TABS
0.1000 mg | ORAL_TABLET | Freq: Once | ORAL | Status: AC
  Administered 2017-04-16: 0.1 mg via ORAL

## 2017-04-16 MED ORDER — CLINDAMYCIN HCL 300 MG PO CAPS: 300.0000 mg | ORAL_CAPSULE | Freq: Three times a day (TID) | ORAL | 0 refills | Status: DC

## 2017-04-16 MED FILL — ?SPIRONOLACTONE 50 MG TABLE: 50 | 30 days supply | Qty: 30 | Fill #0

## 2017-04-16 MED FILL — ?CARVEDILOL 12.5 MG TABLET: 12.5 | 30 days supply | Qty: 60 | Fill #0

## 2017-04-16 MED FILL — ?CLINDAMYCIN HCL 300 MG CAP: 300 | 7 days supply | Qty: 21 | Fill #0

## 2017-04-16 MED FILL — AMLODIPINE BESYLATE 10 MG T: 10 | 30 days supply | Qty: 30 | Fill #0

## 2017-04-16 NOTE — Progress Notes (Signed)
Pt states his bowel movements   When he has to use the bathroom his stomach gets real tight  Pt states he has been using the bathroom more

## 2017-04-16 NOTE — Progress Notes (Signed)
Patient ID: [name], male    DOB: 1975/10/08  MRN: [id number]  CC: Hypertension   Subjective: [name] is a 42 y.o. male who presents for chronic ds management His concerns today include:  Hx of HTN, tob dep, poor dentition  1.  C/o BM more frequent lately -normally has BM 1-2 x a day -past several days, he has had 2-4BMs a day; sometimes has to go right after a meal.  Stools are formed -Blood in stool 1 time several wks ago; he was having diarrhea at the time Has been more stressed recently and not sure if that is playing a role.  No change in eating habits -drinks 5-6 Mountian Dew or Doctor Peppers a day.   2.  Informed me of numerous lumps on skin several mths ago.  He has had them for a while but notice a few of them have inc in size; one on the RT lateral abdominal wall and RT thigh. No pain.  We had discussed sending him to derm once he has OC/cone discount.  He has not applied for either as yet  3.  HTN:  Out of Spir and and Carvedilol x 1 wk.  Did not have time to come to pharmacy until now to get RFs. + Headaches.  4.  Request mouth wash for oral pain.  Taking Ibuprofen too much. Numerous cavities.  Has dental appt in March Patient Active Problem List   Diagnosis Date Noted  . Inguinal hernia of left side without obstruction or gangrene 12/08/2016  . Tobacco use 12/08/2016  . Localized swelling, mass and lump, multiple sites 12/08/2016  . Subcutaneous nodules 07/09/2016  . Acute left-sided thoracic back pain 02/16/2016  . Left inguinal hernia 12/12/2015  . Poor dentition 05/27/2015  . Periodontitis 05/27/2015  . Pain in left wrist 05/27/2015  . HTN (hypertension) 05/12/2015     Current Outpatient Medications on File Prior to Visit  Medication Sig Dispense Refill  . amLODipine (NORVASC) 10 MG tablet Take 1 tablet (10 mg total) by mouth daily. 90 tablet 0  . benzonatate (TESSALON) 100 MG capsule Take 1 capsule (100 mg total) by mouth 2 (two) times daily  as needed for cough. (Patient not taking: Reported on 04/16/2017) 30 capsule 0  . carvedilol (COREG) 12.5 MG tablet Take 1 tablet (12.5 mg total) by mouth 2 (two) times daily with a meal. 180 tablet 0  . chlorpheniramine-HYDROcodone (TUSSIONEX PENNKINETIC ER) 10-8 MG/5ML SUER Take 5 mLs by mouth every 12 (twelve) hours as needed for cough. 100 mL 0  . cyclobenzaprine (FLEXERIL) 5 MG tablet Take 1 tablet (5 mg total) by mouth 2 (two) times daily as needed for muscle spasms. 30 tablet 1  . diclofenac sodium (VOLTAREN) 1 % GEL Apply 2 g topically 4 (four) times daily. 100 g 1  . nystatin (MYCOSTATIN/NYSTOP) powder Apply topically 2 (two) times daily. 30 g 1  . spironolactone (ALDACTONE) 50 MG tablet Take 1 tablet (50 mg total) by mouth daily. 90 tablet 0  . trimethoprim-polymyxin b (POLYTRIM) ophthalmic solution Place 1 drop every 6 (six) hours into the right eye. 10 mL 0  . [DISCONTINUED] promethazine (PHENERGAN) 25 MG tablet Take 1 tablet (25 mg total) by mouth every 6 (six) hours as needed for nausea or vomiting. (Patient not taking: Reported on 06/22/2014) 12 tablet 0   Current Facility-Administered Medications on File Prior to Visit  Medication Dose Route Frequency Provider Last Rate Last Dose  . cloNIDine (CATAPRES) tablet  0.2 mg  0.2 mg Oral Once Ladell Pier, MD        Allergies  Allergen Reactions  . Penicillins Swelling and Rash    Has patient had a PCN reaction causing immediate rash, facial/tongue/throat swelling, SOB or lightheadedness with hypotension: Yes/No:[id number] Yes Has patient had a PCN reaction causing severe rash involving mucus membranes or skin necrosis: Yes/No:[id number] No Has patient had a PCN reaction that required hospitalization Yes/No:[id number] Yes Has patient had a PCN reaction occurring within the last 10 years: Yes/No:[id number] No If all of the above answers are "NO", then may proceed with Cephalospori    Social History   Socioeconomic History  . Marital status:  Single    Spouse name: Not on file  . Number of children: Not on file  . Years of education: Not on file  . Highest education level: Not on file  Social Needs  . Financial resource strain: Not on file  . Food insecurity - worry: Not on file  . Food insecurity - inability: Not on file  . Transportation needs - medical: Not on file  . Transportation needs - non-medical: Not on file  Occupational History  . Not on file  Tobacco Use  . Smoking status: Former Smoker    Packs/day: 1.00    Types: Cigars    Last attempt to quit: 05/10/2015    Years since quitting: 1.9  . Smokeless tobacco: Never Used  Substance and Sexual Activity  . Alcohol use: Yes    Comment: occ  . Drug use: No  . Sexual activity: Not on file  Other Topics Concern  . Not on file  Social History Narrative  . Not on file    Family History  Problem Relation Age of Onset  . Cancer Mother     Past Surgical History:  Procedure Laterality Date  . CHOLECYSTECTOMY      ROS: Review of Systems As above  PHYSICAL EXAM: BP (!) 184/135   Pulse 70   Temp 98.1 F (36.7 C) (Oral)   Resp 16   Wt 232 lb (105.2 kg)   SpO2 97%   BMI 29.79 kg/m   Repeat BP post Clonidine 160/120 Physical Exam  General appearance - alert, well appearing, and in no distress Mental status - alert, oriented to person, place, and time Mouth -numerous cavities with teeth broken off in gum. Gum inflammed  Chest - clear to auscultation, no wheezes, rales or rhonchi, symmetric air entry Heart - normal rate, regular rhythm, normal S1, S2, no murmurs, rubs, clicks or gallops Abdomen - NL bowel sounds, sofy, NT Extremities - peripheral pulses normal, no pedal edema, no clubbing or cyanosis Skin - 1.5 cm soft movable subcut mass RT lateral abdominal wall.  Several on arms   ASSESSMENT AND PLAN: 1. Essential hypertension Recommend that he avoids running out of his medication as uncontrolled blood pressure puts him at risk for CV events.   Given 1 dose of clonidine here in clinic today.  He will pick up refills on carvedilol and Spironolactone from the pharmacy today. Stop ibuprofen. - cloNIDine (CATAPRES) tablet 0.1 mg  2. Poor dentition -Viscous lidocaine to use as needed. - clindamycin (CLEOCIN) 300 MG capsule; Take 1 capsule (300 mg total) by mouth 3 (three) times daily.  Dispense: 21 capsule; Refill: 0  3. Abnormal skin growth -Likely lipomas versus cysts.  Will refer to Derm to have 1 of these biopsied once he gets the orange card  4. Change in bowel habit Could be due  to diet.  Advised cutting back on sugary drinks.  If no improvement, he will let me know  Patient was given the opportunity to ask questions.  Patient verbalized understanding of the plan and was able to repeat key elements of the plan.   No orders of the defined types were placed in this encounter.    Requested Prescriptions    No prescriptions requested or ordered in this encounter    No Follow-up on file.  Karle Plumber, MD, FACP

## 2017-04-16 NOTE — Patient Instructions (Signed)
Nurse only BP check in 1 week.  Cut back on drinking sodas.    Try to avoid running out of BP medications.

## 2017-05-02 ENCOUNTER — Encounter: Payer: Self-pay | Admitting: Family Medicine

## 2017-05-02 ENCOUNTER — Ambulatory Visit: Payer: Self-pay | Attending: Family Medicine | Admitting: Family Medicine

## 2017-05-02 ENCOUNTER — Other Ambulatory Visit: Payer: Self-pay

## 2017-05-02 VITALS — BP 131/89 | HR 84 | Temp 98.1°F | Ht 74.0 in | Wt 225.2 lb

## 2017-05-02 DIAGNOSIS — R220 Localized swelling, mass and lump, head: Secondary | ICD-10-CM | POA: Insufficient documentation

## 2017-05-02 DIAGNOSIS — R6884 Jaw pain: Secondary | ICD-10-CM | POA: Insufficient documentation

## 2017-05-02 DIAGNOSIS — I10 Essential (primary) hypertension: Secondary | ICD-10-CM | POA: Insufficient documentation

## 2017-05-02 DIAGNOSIS — I440 Atrioventricular block, first degree: Secondary | ICD-10-CM | POA: Insufficient documentation

## 2017-05-02 DIAGNOSIS — J3489 Other specified disorders of nose and nasal sinuses: Secondary | ICD-10-CM

## 2017-05-02 DIAGNOSIS — R0789 Other chest pain: Secondary | ICD-10-CM | POA: Insufficient documentation

## 2017-05-02 DIAGNOSIS — Z9049 Acquired absence of other specified parts of digestive tract: Secondary | ICD-10-CM | POA: Insufficient documentation

## 2017-05-02 DIAGNOSIS — R9431 Abnormal electrocardiogram [ECG] [EKG]: Secondary | ICD-10-CM

## 2017-05-02 DIAGNOSIS — Z79899 Other long term (current) drug therapy: Secondary | ICD-10-CM | POA: Insufficient documentation

## 2017-05-02 DIAGNOSIS — J349 Unspecified disorder of nose and nasal sinuses: Secondary | ICD-10-CM

## 2017-05-02 MED ORDER — CETIRIZINE HCL 10 MG PO TABS: 10.0000 mg | ORAL_TABLET | Freq: Every day | ORAL | 1 refills | Status: DC

## 2017-05-02 MED ORDER — NAPROXEN 500 MG PO TABS
500.0000 mg | ORAL_TABLET | Freq: Two times a day (BID) | ORAL | 0 refills | Status: DC
Start: 2017-05-02 — End: 2017-08-14

## 2017-05-02 NOTE — Progress Notes (Signed)
Subjective:  Patient ID: [name], male    DOB: 1975/07/28  Age: 42 y.o. MRN: [id number]  CC: Dental Pain and Facial Swelling   HPI [name] is a 42 year old male with a history of hypertension who presents today for an acute visit complaining of right nasolabial swelling which she has noticed over the last few days.  He denies the presence of fever or tooth ache. At his last visit with his PCP 2 weeks ago he was treated with clindamycin for dental pain. He denies fever, postnasal drip, sinus congestion or headaches.  He informs me that a couple of years back he was told he had a mass in his nose; he denies epistaxis.  CT head without contrast 06/2014: IMPRESSION: 1. Negative intracranial imaging. 2. 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 Recommend ENT referral for visualization.  He endorses intermittent left-sided chest pains worse when he attempts to sit up from a lying position and vice versa but denies shortness of breath.  Pain does not radiate. Review of his chart indicate ED visits for chest pains at which time EKG had revealed lateral wall ST changes but patient never followed through with cardiology.  Past Medical History:  Diagnosis Date  . Hypertension 2003   dx age 78   . Wears glasses     Past Surgical History:  Procedure Laterality Date  . CHOLECYSTECTOMY        Outpatient Medications Prior to Visit  Medication Sig Dispense Refill  . amLODipine (NORVASC) 10 MG tablet Take 1 tablet (10 mg total) by mouth daily. 90 tablet 0  . carvedilol (COREG) 12.5 MG tablet Take 1 tablet (12.5 mg total) by mouth 2 (two) times daily with a meal. 180 tablet 0  . diclofenac sodium (VOLTAREN) 1 % GEL Apply 2 g topically 4 (four) times daily. 100 g 1  . nystatin (MYCOSTATIN/NYSTOP) powder Apply topically 2 (two) times daily. 30 g 1  . spironolactone (ALDACTONE) 50 MG tablet Take 1 tablet (50 mg total) by mouth daily. 90 tablet 0  .  chlorpheniramine-HYDROcodone (TUSSIONEX PENNKINETIC ER) 10-8 MG/5ML SUER Take 5 mLs by mouth every 12 (twelve) hours as needed for cough. (Patient not taking: Reported on 05/02/2017) 100 mL 0  . clindamycin (CLEOCIN) 300 MG capsule Take 1 capsule (300 mg total) by mouth 3 (three) times daily. (Patient not taking: Reported on 05/02/2017) 21 capsule 0  . cyclobenzaprine (FLEXERIL) 5 MG tablet Take 1 tablet (5 mg total) by mouth 2 (two) times daily as needed for muscle spasms. (Patient not taking: Reported on 05/02/2017) 30 tablet 1  . lidocaine (XYLOCAINE) 2 % solution Swish and spit 20 ml Q 4hrs PRN (Patient not taking: Reported on 05/02/2017) 400 mL 0  . trimethoprim-polymyxin b (POLYTRIM) ophthalmic solution Place 1 drop every 6 (six) hours into the right eye. (Patient not taking: Reported on 05/02/2017) 10 mL 0   Facility-Administered Medications Prior to Visit  Medication Dose Route Frequency Provider Last Rate Last Dose  . cloNIDine (CATAPRES) tablet 0.2 mg  0.2 mg Oral Once Ladell Pier, MD        ROS Review of Systems  Constitutional: Negative for activity change and appetite change.  HENT: Positive for facial swelling. Negative for postnasal drip, sinus pressure and sore throat.   Eyes: Negative for visual disturbance.  Respiratory: Negative for cough, chest tightness and shortness of breath.   Cardiovascular: Positive for chest pain. Negative for leg swelling.  Gastrointestinal: Negative for abdominal distention, abdominal pain, constipation and diarrhea.  Endocrine: Negative.   Genitourinary: Negative for dysuria.  Musculoskeletal: Negative for joint swelling and myalgias.  Skin: Negative for rash.  Allergic/Immunologic: Negative.   Neurological: Negative for weakness, light-headedness and numbness.  Psychiatric/Behavioral: Negative for dysphoric mood and suicidal ideas.    Objective:  BP 131/89   Pulse 84   Temp 98.1 F (36.7 C) (Oral)   Ht 6\' 2"  (1.88 m)   Wt 225 lb 3.2  oz (102.2 kg)   SpO2 97%   BMI 28.91 kg/m   BP/Weight 05/02/2017 04/16/2017 79/03/5054  Systolic BP 979 480 165  Diastolic BP 89 537 86  Wt. (Lbs) 225.2 232 222  BMI 28.91 29.79 28.5      Physical Exam  Constitutional: He is oriented to person, place, and time. He appears well-developed and well-nourished.  HENT:  Right maxillary sinus tenderness. Nasal mass on lateral aspect of right nare  Cardiovascular: Normal rate, normal heart sounds and intact distal pulses.  No murmur heard. Pulmonary/Chest: Effort normal and breath sounds normal. He has no wheezes. He has no rales. He exhibits tenderness (reproducible).  Abdominal: Soft. Bowel sounds are normal. He exhibits no distension and no mass. There is no tenderness.  Musculoskeletal: Normal range of motion.  Neurological: He is alert and oriented to person, place, and time.     Assessment & Plan:   1. Nasal mass Evident on CT head from 06/2014 He will need ENT referral and has been advised to apply for the cone financial discount Follow-up with PCP at next visit once approved for the cone discount so referral can be placed  2.  Other chest pain EKG reveals first-degree AV block, T wave inversions in lateral leads-previously referred to cardiology but he never followed through I have referred him to cardiology again We will also place on naproxen as he could also have underlying costochondritis as chest pain is reproducible as well - naproxen (NAPROSYN) 500 MG tablet; Take 1 tablet (500 mg total) by mouth 2 (two) times daily with a meal.  Dispense: 60 tablet; Refill: 0  3. Maxillary pain - cetirizine (ZYRTEC) 10 MG tablet; Take 1 tablet (10 mg total) by mouth daily.  Dispense: 30 tablet; Refill: 1 - naproxen (NAPROSYN) 500 MG tablet; Take 1 tablet (500 mg total) by mouth 2 (two) times daily with a meal.  Dispense: 60 tablet; Refill: 0   Meds ordered this encounter  Medications  . cetirizine (ZYRTEC) 10 MG tablet    Sig: Take  1 tablet (10 mg total) by mouth daily.    Dispense:  30 tablet    Refill:  1  . naproxen (NAPROSYN) 500 MG tablet    Sig: Take 1 tablet (500 mg total) by mouth 2 (two) times daily with a meal.    Dispense:  60 tablet    Refill:  0    Follow-up: No Follow-up on file.   Charlott Rakes MD

## 2017-05-02 NOTE — Patient Instructions (Signed)
Costochondritis Costochondritis is swelling and irritation (inflammation) of the tissue (cartilage) that connects your ribs to your breastbone (sternum). This causes pain in the front of your chest. Usually, the pain:  Starts gradually.  Is in more than one rib.  This condition usually goes away on its own over time. Follow these instructions at home:  Do not do anything that makes your pain worse.  If directed, put ice on the painful area: ? Put ice in a plastic bag. ? Place a towel between your skin and the bag. ? Leave the ice on for 20 minutes, 2-3 times a day.  If directed, put heat on the affected area as often as told by your doctor. Use the heat source that your doctor tells you to use, such as a moist heat pack or a heating pad. ? Place a towel between your skin and the heat source. ? Leave the heat on for 20-30 minutes. ? Take off the heat if your skin turns bright red. This is very important if you cannot feel pain, heat, or cold. You may have a greater risk of getting burned.  Take over-the-counter and prescription medicines only as told by your doctor.  Return to your normal activities as told by your doctor. Ask your doctor what activities are safe for you.  Keep all follow-up visits as told by your doctor. This is important. Contact a doctor if:  You have chills or a fever.  Your pain does not go away or it gets worse.  You have a cough that does not go away. Get help right away if:  You are short of breath. This information is not intended to replace advice given to you by your health care provider. Make sure you discuss any questions you have with your health care provider. Document Released: 08/15/2007 Document Revised: 09/16/2015 Document Reviewed: 06/22/2015 Elsevier Interactive Patient Education  2018 [employer]  

## 2017-05-03 ENCOUNTER — Encounter: Payer: Self-pay | Admitting: Family Medicine

## 2017-05-03 ENCOUNTER — Ambulatory Visit: Payer: Self-pay | Admitting: Internal Medicine

## 2017-05-14 ENCOUNTER — Encounter: Payer: Self-pay | Admitting: Nurse Practitioner

## 2017-05-14 ENCOUNTER — Ambulatory Visit: Payer: Self-pay | Attending: Nurse Practitioner | Admitting: Nurse Practitioner

## 2017-05-14 VITALS — BP 138/99 | HR 84 | Temp 98.0°F | Ht 74.0 in | Wt 230.6 lb

## 2017-05-14 DIAGNOSIS — M25552 Pain in left hip: Secondary | ICD-10-CM | POA: Insufficient documentation

## 2017-05-14 DIAGNOSIS — Z79899 Other long term (current) drug therapy: Secondary | ICD-10-CM | POA: Insufficient documentation

## 2017-05-14 DIAGNOSIS — Z9049 Acquired absence of other specified parts of digestive tract: Secondary | ICD-10-CM | POA: Insufficient documentation

## 2017-05-14 DIAGNOSIS — M545 Low back pain: Secondary | ICD-10-CM | POA: Insufficient documentation

## 2017-05-14 DIAGNOSIS — I10 Essential (primary) hypertension: Secondary | ICD-10-CM | POA: Insufficient documentation

## 2017-05-14 DIAGNOSIS — M79605 Pain in left leg: Secondary | ICD-10-CM | POA: Insufficient documentation

## 2017-05-14 DIAGNOSIS — G8929 Other chronic pain: Secondary | ICD-10-CM | POA: Insufficient documentation

## 2017-05-14 DIAGNOSIS — Z880 Allergy status to penicillin: Secondary | ICD-10-CM | POA: Insufficient documentation

## 2017-05-14 MED ORDER — TIZANIDINE HCL 4 MG PO TABS: 4.0000 mg | ORAL_TABLET | Freq: Four times a day (QID) | ORAL | 1 refills | Status: DC | PRN

## 2017-05-14 NOTE — Patient Instructions (Addendum)
Gluteus Medius Syndrome Gluteus medius syndrome causes pain on the outside of your hip due to repeated overstretching or overwork of the gluteus medius muscle. This muscle runs from the top, outer part of your pelvis to the top of your thigh bone (femur). This muscle helps you lift your leg to the side and rotate your leg. It also keeps your hip stable and aligned when you are standing, walking, or running. What are the causes? This condition is caused by small injuries to the gluteus medius muscle over time. It happens from repetitive movements or a sudden increase in amount or intensity of activity that involves the legs. It starts with muscle inflammation and may lead to small tears and scarring in your muscle. What increases the risk? This condition is more likely to develop in people who:  Run on soft or uneven surfaces, such as sand or grass.  Have weakness in their hips and core muscles.  Run long distances.  Increase their time, distance, or intensity of their sport over a short period of time.  What are the signs or symptoms? The main symptom of this condition is pain on the outside of your hip with activity. Typically, pain will gradually increase the longer you play sports or run, and it will decrease with rest. Your hip may also be tender to the touch and you may have muscle spasms. How is this diagnosed? This condition is diagnosed based on your symptoms, medical history, and physical exam. During the exam, your health care provider will touch different areas of your hip to test for pain. You may also have imaging studies such as X-rays and MRI to rule out other causes of pain. How is this treated? Treatment for this condition includes:  Decreasing mileage or time spent doing sports.  Having a coach help you with your running form.  Stretching or strengthening exercises (physical therapy).  Icing painful areas.  Follow these instructions at home:  Take over-the-counter  and prescription medicines only as told by your health care provider.  If directed, apply ice to the injured area. ? Put ice in a plastic bag. ? Place a towel between your skin and the bag. ? Leave the ice on for 20 minutes, 2-3 times a day.  Do exercises as told by your physical therapist.  Return to your normal activities as told by your health care provider. Ask your health care provider what activities are safe for you.  Keep all follow-up visits as told by your health care provider. This is important.  Try not to lie on your painful side. When lying on your other side, put a pillow between your knees to decrease strain on your top hip muscles. How is this prevented?  Warm up and stretch before being active.  Cool down and stretch after being active.  Give your body time to rest between periods of activity.  Include a variety of exercises and activities in your routine to avoid overuse injuries.  Maintain physical fitness, including: ? Strength. ? Flexibility. ? Cardiovascular fitness. ? Endurance. Contact a health care provider if:  Your pain does not get better or it gets worse. This information is not intended to replace advice given to you by your health care provider. Make sure you discuss any questions you have with your health care provider. Document Released: 02/26/2005 Document Revised: 11/01/2015 Document Reviewed: 02/08/2015 Elsevier Interactive Patient Education  2018 [employer].  Hip Pain The hip is the joint between the upper legs and  the lower pelvis. The bones, cartilage, tendons, and muscles of your hip joint support your body and allow you to move around. Hip pain can range from a minor ache to severe pain in one or both of your hips. The pain may be felt on the inside of the hip joint near the groin, or the outside near the buttocks and upper thigh. You may also have swelling or stiffness. Follow these instructions at home: Managing pain, stiffness,  and swelling  If directed, apply ice to the injured area. ? Put ice in a plastic bag. ? Place a towel between your skin and the bag. ? Leave the ice on for 20 minutes, 2-3 times a day  Sleep with a pillow between your legs on your most comfortable side.  Avoid any activities that cause pain. General instructions  Take over-the-counter and prescription medicines only as told by your health care provider.  Do any exercises as told by your health care provider.  Record the following: ? How often you have hip pain. ? The location of your pain. ? What the pain feels like. ? What makes the pain worse.  Keep all follow-up visits as told by your health care provider. This is important. Contact a health care provider if:  You cannot put weight on your leg.  Your pain or swelling continues or gets worse after one week.  It gets harder to walk.  You have a fever. Get help right away if:  You fall.  You have a sudden increase in pain and swelling in your hip.  Your hip is red or swollen or very tender to touch. Summary  Hip pain can range from a minor ache to severe pain in one or both of your hips.  The pain may be felt on the inside of the hip joint near the groin, or the outside near the buttocks and upper thigh.  Avoid any activities that cause pain.  Record how often you have hip pain, the location of the pain, what makes it worse and what it feels like. This information is not intended to replace advice given to you by your health care provider. Make sure you discuss any questions you have with your health care provider. Document Released: 08/16/2009 Document Revised: 01/30/2016 Document Reviewed: 01/30/2016 Elsevier Interactive Patient Education  [employer].

## 2017-05-14 NOTE — Progress Notes (Signed)
Assessment & Plan:  [name] was seen today for leg pain.  Diagnoses and all orders for this visit:  Left leg pain -     tiZANidine (ZANAFLEX) 4 MG tablet; Take 1 tablet (4 mg total) by mouth every 6 (six) hours as needed for muscle spasms. Patient was instructed to take Naprosyn which was previous prescribed for pain relief We discussed heat and ice application as well as wearing a back brace as his pain could be related to his chronic LBP  Left hip pain -     tiZANidine (ZANAFLEX) 4 MG tablet; Take 1 tablet (4 mg total) by mouth every 6 (six) hours as needed for muscle spasms. Patient was instructed to take Naprosyn which was previous prescribed for pain relief We discussed heat and ice application as well as wearing a back brace as his pain could be related to his chronic LBP  Patient has been counseled on age-appropriate routine health concerns for screening and prevention. These are reviewed and up-to-date. Referrals have been placed accordingly. Immunizations are up-to-date or declined.    Subjective:   Chief Complaint  Patient presents with  . Leg Pain    Patient stated he have pain on his left leg any movements and he can't put weight on it. Patient stated it started since last Wednesday.    [name] 42 y.o. male presents to office today left leg pain. He was prescribed Naproxen on 05-02-2017 however he reports he did not pick up his prescription. I instructed him that I would order a muscle relaxant for him to take however he also needed to pick up his Naproxen from the pharmacy as well as this may provide additional pain relief. He verbalized understanding.   Leg Pain   Incident onset: 6 days ago. There was no injury mechanism. The pain is present in the left hip. Quality: Sharp. The pain is at a severity of 6/10. The pain is moderate. The pain has been intermittent since onset. Associated symptoms include an inability to bear weight (only in the morning after he wakes up  and attempts to get out of the bed). Pertinent negatives include no loss of motion, loss of sensation, muscle weakness, numbness or tingling. He reports no foreign bodies present. Exacerbated by: walking, sitting, twisting turning and lifting heavy objects at work. He has tried NSAIDs (voltaren gel, OTC ibuprofen ASA) for the symptoms. The treatment provided mild (voltaren gels provides minimal relief) relief.  He denies any previous trauma or injury to his back, hip or left leg. He denies any incontinence of bowel and bladder. Pain is worse in the mornings. He played basketball from childhood til the age of 3 and has been active in sports most of his life until he started experiencing low back pain several years ago.   Hip Pain Patient complains of chronic left hip pain with location in the posterior hip down to the left gluteal area. Inciting event: none. Current symptoms include is worse with weight bearing, is aggravated by walking and is worse after period of inactivity. Associated symptoms: none, left leg pain. Aggravating symptoms: any weight bearing, going up and down stairs, kneeling, pivoting, rising after sitting, squatting, standing and walking. Patient's overall course: intermittent. Patient has had prior hip problems. Previous visits for this problem: yes, last seen in the past year for chronic LBP by the patient's PCP. Evaluation to date: ESR, which was abnormal and a Rheumatology consult (pending appointment scheduling after patient has seen financial counselor).  Treatment to date: prescription analgesics, which have been somewhat effective.  Review of Systems  Constitutional: Positive for malaise/fatigue. Negative for chills, fever and weight loss.  Respiratory: Negative.  Negative for cough and shortness of breath.   Cardiovascular: Negative.  Negative for chest pain, orthopnea, claudication and leg swelling.  Gastrointestinal: Negative for abdominal pain.  Genitourinary: Negative.     Musculoskeletal: Positive for back pain, joint pain and myalgias. Negative for falls and neck pain.  Neurological: Positive for weakness. Negative for dizziness, tingling, sensory change, focal weakness, numbness and headaches.  Psychiatric/Behavioral: Negative.  Negative for depression.    Past Medical History:  Diagnosis Date  . Hypertension 2003   dx age 46   . Wears glasses     Past Surgical History:  Procedure Laterality Date  . CHOLECYSTECTOMY      Family History  Problem Relation Age of Onset  . Cancer Mother     Social History Reviewed with no changes to be made today.   Outpatient Medications Prior to Visit  Medication Sig Dispense Refill  . amLODipine (NORVASC) 10 MG tablet Take 1 tablet (10 mg total) by mouth daily. 90 tablet 0  . carvedilol (COREG) 12.5 MG tablet Take 1 tablet (12.5 mg total) by mouth 2 (two) times daily with a meal. 180 tablet 0  . diclofenac sodium (VOLTAREN) 1 % GEL Apply 2 g topically 4 (four) times daily. 100 g 1  . spironolactone (ALDACTONE) 50 MG tablet Take 1 tablet (50 mg total) by mouth daily. 90 tablet 0  . cetirizine (ZYRTEC) 10 MG tablet Take 1 tablet (10 mg total) by mouth daily. (Patient not taking: Reported on 05/14/2017) 30 tablet 1  . chlorpheniramine-HYDROcodone (TUSSIONEX PENNKINETIC ER) 10-8 MG/5ML SUER Take 5 mLs by mouth every 12 (twelve) hours as needed for cough. (Patient not taking: Reported on 05/02/2017) 100 mL 0  . clindamycin (CLEOCIN) 300 MG capsule Take 1 capsule (300 mg total) by mouth 3 (three) times daily. (Patient not taking: Reported on 05/02/2017) 21 capsule 0  . cyclobenzaprine (FLEXERIL) 5 MG tablet Take 1 tablet (5 mg total) by mouth 2 (two) times daily as needed for muscle spasms. (Patient not taking: Reported on 05/02/2017) 30 tablet 1  . lidocaine (XYLOCAINE) 2 % solution Swish and spit 20 ml Q 4hrs PRN (Patient not taking: Reported on 05/02/2017) 400 mL 0  . naproxen (NAPROSYN) 500 MG tablet Take 1 tablet (500 mg  total) by mouth 2 (two) times daily with a meal. (Patient not taking: Reported on 05/14/2017) 60 tablet 0  . nystatin (MYCOSTATIN/NYSTOP) powder Apply topically 2 (two) times daily. (Patient not taking: Reported on 05/14/2017) 30 g 1  . trimethoprim-polymyxin b (POLYTRIM) ophthalmic solution Place 1 drop every 6 (six) hours into the right eye. (Patient not taking: Reported on 05/02/2017) 10 mL 0   Facility-Administered Medications Prior to Visit  Medication Dose Route Frequency Provider Last Rate Last Dose  . cloNIDine (CATAPRES) tablet 0.2 mg  0.2 mg Oral Once Ladell Pier, MD        Allergies  Allergen Reactions  . Penicillins Swelling and Rash    Has patient had a PCN reaction causing immediate rash, facial/tongue/throat swelling, SOB or lightheadedness with hypotension: Yes Has patient had a PCN reaction causing severe rash involving mucus membranes or skin necrosis: NoNo Has patient had a PCN reaction that required hospitalization Yes Has patient had a PCN reaction occurring within the last 10 years: No If all of the above answers are "  NO", then may proceed with Cephalosporin       Objective:    BP (!) 138/99 (BP Location: Left Arm, Patient Position: Sitting, Cuff Size: Large)   Pulse 84   Temp 98 F (36.7 C) (Oral)   Ht '6\' 2"'  (1.88 m)   Wt 230 lb 9.6 oz (104.6 kg)   SpO2 96%   BMI 29.61 kg/m  Wt Readings from Last 3 Encounters:  05/14/17 230 lb 9.6 oz (104.6 kg)  05/02/17 225 lb 3.2 oz (102.2 kg)  04/16/17 232 lb (105.2 kg)    Physical Exam  Constitutional: He is oriented to person, place, and time. He appears well-developed and well-nourished.  HENT:  Head: Normocephalic.  Eyes: EOM are normal.  Neck: Normal range of motion.  Cardiovascular: Normal rate, regular rhythm and normal heart sounds.  Pulmonary/Chest: Effort normal and breath sounds normal.  Musculoskeletal: He exhibits tenderness (left gluteal TTP). He exhibits no edema or deformity.       Left hip: He  exhibits normal range of motion, normal strength, no swelling and no deformity.       Left upper leg: He exhibits no bony tenderness, no swelling, no edema and no deformity.  Patient noted to have difficult rising from sitting position to standing position due to pain when asked to walk across the room. He endorses pain in left hip and hamstring with active extension of left leg while in seated position.   Neurological: He is alert and oriented to person, place, and time.  Skin: Skin is warm and dry.  Psychiatric: He has a normal mood and affect. His behavior is normal. Judgment and thought content normal.      Patient has been counseled extensively about nutrition and exercise as well as the importance of adherence with medications and regular follow-up. The patient was given clear instructions to go to ER or return to medical center if symptoms don't improve, worsen or new problems develop. The patient verbalized understanding.   Follow-up: Return in about 4 weeks (around 06/11/2017), or if symptoms worsen or fail to improve   Gildardo Pounds, FNP-BC [facility] [place], [place]   05/14/2017, 3:58 PM

## 2017-05-16 MED FILL — NAPROXEN 500 MG TABLET: 500 | 30 days supply | Qty: 60 | Fill #0

## 2017-05-20 ENCOUNTER — Ambulatory Visit: Payer: Self-pay

## 2017-05-23 ENCOUNTER — Ambulatory Visit: Payer: Self-pay | Attending: Internal Medicine | Admitting: Internal Medicine

## 2017-05-23 ENCOUNTER — Encounter: Payer: Self-pay | Admitting: Internal Medicine

## 2017-05-23 ENCOUNTER — Ambulatory Visit: Payer: Self-pay

## 2017-05-23 VITALS — BP 141/95 | HR 82 | Temp 98.2°F | Resp 16 | Wt 226.6 lb

## 2017-05-23 DIAGNOSIS — K1379 Other lesions of oral mucosa: Secondary | ICD-10-CM | POA: Insufficient documentation

## 2017-05-23 DIAGNOSIS — K029 Dental caries, unspecified: Secondary | ICD-10-CM | POA: Insufficient documentation

## 2017-05-23 DIAGNOSIS — K089 Disorder of teeth and supporting structures, unspecified: Secondary | ICD-10-CM

## 2017-05-23 DIAGNOSIS — Z880 Allergy status to penicillin: Secondary | ICD-10-CM | POA: Insufficient documentation

## 2017-05-23 DIAGNOSIS — Z87891 Personal history of nicotine dependence: Secondary | ICD-10-CM | POA: Insufficient documentation

## 2017-05-23 DIAGNOSIS — I10 Essential (primary) hypertension: Secondary | ICD-10-CM | POA: Insufficient documentation

## 2017-05-23 MED ORDER — CLINDAMYCIN HCL 300 MG PO CAPS: 300.0000 mg | ORAL_CAPSULE | Freq: Three times a day (TID) | ORAL | 1 refills | Status: DC

## 2017-05-23 MED ORDER — TRAMADOL HCL 50 MG PO TABS: 50.0000 mg | ORAL_TABLET | Freq: Three times a day (TID) | ORAL | 0 refills | Status: DC | PRN

## 2017-05-23 NOTE — Progress Notes (Signed)
Patient ID: [name], male    DOB: 1975/12/30  MRN: [id number]  CC: Oral Pain   Subjective: [name] is a 42 y.o. male who presents for UC visit. His concerns today include:   Seen by Dr. Margarita Rana 05/02/2017 for maxillary pain, nasal mass and CP.  Referred to cardiology.  Seen again 05/14/2017 with LT leg pain  Today: C/o toothache in the left lower jaw.  He thinks he has had a little swelling of the jaw on the left side.  He has numerous dental cavities with teeth broken off in the gum.  He was seen back in January for dental pain and placed on clindamycin.  Pain at that time was on the right side.  He has an appointment with [facility] in [facility] the end of April.  They do take the orange card.  He has an appointment next week with Ms. Luciana Axe to see if he would be approved for the orange card. -Over the past several days he is taking Naprosyn twice a day and taking ibuprofen several times a day to keep the pain under control.  He is requesting some Tylenol threes and antibiotics. -  Patient Active Problem List   Diagnosis Date Noted  . Inguinal hernia of left side without obstruction or gangrene 12/08/2016  . Tobacco use 12/08/2016  . Localized swelling, mass and lump, multiple sites 12/08/2016  . Subcutaneous nodules 07/09/2016  . Acute left-sided thoracic back pain 02/16/2016  . Left inguinal hernia 12/12/2015  . Poor dentition 05/27/2015  . Periodontitis 05/27/2015  . Pain in left wrist 05/27/2015  . HTN (hypertension) 05/12/2015     Current Outpatient Medications on File Prior to Visit  Medication Sig Dispense Refill  . amLODipine (NORVASC) 10 MG tablet Take 1 tablet (10 mg total) by mouth daily. 90 tablet 0  . carvedilol (COREG) 12.5 MG tablet Take 1 tablet (12.5 mg total) by mouth 2 (two) times daily with a meal. 180 tablet 0  . cetirizine (ZYRTEC) 10 MG tablet Take 1 tablet (10 mg total) by mouth daily. (Patient not taking: Reported on 05/14/2017) 30  tablet 1  . chlorpheniramine-HYDROcodone (TUSSIONEX PENNKINETIC ER) 10-8 MG/5ML SUER Take 5 mLs by mouth every 12 (twelve) hours as needed for cough. (Patient not taking: Reported on 05/02/2017) 100 mL 0  . diclofenac sodium (VOLTAREN) 1 % GEL Apply 2 g topically 4 (four) times daily. 100 g 1  . lidocaine (XYLOCAINE) 2 % solution Swish and spit 20 ml Q 4hrs PRN (Patient not taking: Reported on 05/02/2017) 400 mL 0  . naproxen (NAPROSYN) 500 MG tablet Take 1 tablet (500 mg total) by mouth 2 (two) times daily with a meal. (Patient not taking: Reported on 05/14/2017) 60 tablet 0  . nystatin (MYCOSTATIN/NYSTOP) powder Apply topically 2 (two) times daily. (Patient not taking: Reported on 05/14/2017) 30 g 1  . spironolactone (ALDACTONE) 50 MG tablet Take 1 tablet (50 mg total) by mouth daily. 90 tablet 0  . tiZANidine (ZANAFLEX) 4 MG tablet Take 1 tablet (4 mg total) by mouth every 6 (six) hours as needed for muscle spasms. 60 tablet 1  . trimethoprim-polymyxin b (POLYTRIM) ophthalmic solution Place 1 drop every 6 (six) hours into the right eye. (Patient not taking: Reported on 05/02/2017) 10 mL 0  . [DISCONTINUED] promethazine (PHENERGAN) 25 MG tablet Take 1 tablet (25 mg total) by mouth every 6 (six) hours as needed for nausea or vomiting. (Patient not taking: Reported on 06/22/2014)  12 tablet 0   Current Facility-Administered Medications on File Prior to Visit  Medication Dose Route Frequency Provider Last Rate Last Dose  . cloNIDine (CATAPRES) tablet 0.2 mg  0.2 mg Oral Once Ladell Pier, MD        Allergies  Allergen Reactions  . Penicillins Swelling and Rash    Has patient had a PCN reaction causing immediate rash, facial/tongue/throat swelling, SOB or lightheadedness with hypotension: Yes/No:[id number] Yes Has patient had a PCN reaction causing severe rash involving mucus membranes or skin necrosis: Yes/No:[id number] No Has patient had a PCN reaction that required hospitalization  Yes/No:[id number] Yes Has patient had a PCN reaction occurring within the last 10 years: Yes/No:[id number] No If all of the above answers are "NO", then may proceed with Cephalospori    Social History   Socioeconomic History  . Marital status: Single    Spouse name: Not on file  . Number of children: Not on file  . Years of education: Not on file  . Highest education level: Not on file  Social Needs  . Financial resource strain: Not on file  . Food insecurity - worry: Not on file  . Food insecurity - inability: Not on file  . Transportation needs - medical: Not on file  . Transportation needs - non-medical: Not on file  Occupational History  . Not on file  Tobacco Use  . Smoking status: Former Smoker    Packs/day: 1.00    Types: Cigars    Last attempt to quit: 05/10/2015    Years since quitting: 2.0  . Smokeless tobacco: Never Used  Substance and Sexual Activity  . Alcohol use: Yes    Comment: occ  . Drug use: No  . Sexual activity: Not on file  Other Topics Concern  . Not on file  Social History Narrative  . Not on file    Family History  Problem Relation Age of Onset  . Cancer Mother     Past Surgical History:  Procedure Laterality Date  . CHOLECYSTECTOMY      ROS: Review of Systems Negative except as above PHYSICAL EXAM: BP (!) 141/95   Pulse 82   Temp 98.2 F (36.8 C) (Oral)   Resp 16   Wt 226 lb 9.6 oz (102.8 kg)   SpO2 99%   BMI 29.09 kg/m   Physical Exam  General appearance - alert, well appearing, and in no distress Mental status - alert, oriented to person, place, and time, normal mood, behavior, speech, dress, motor activity, and thought processes Mouth -second and fourth molar in the left lower jaw are decayed and broken off in the gum.  Surrounding gum is inflamed.  No abscess seen.  No swelling of the jaw noted.  ASSESSMENT AND PLAN: 1. Pain due to dental caries 2. Poor dentition Patient given clindamycin to use for 7 days.  One refill  given in the event that he has a reoccurrence before he sees the dentist the end of next month.  Limited supply of tramadol to use as needed. - clindamycin (CLEOCIN) 300 MG capsule; Take 1 capsule (300 mg total) by mouth 3 (three) times daily.  Dispense: 21 capsule; Refill: 1   Patient was given the opportunity to ask questions.  Patient verbalized understanding of the plan and was able to repeat key elements of the plan.   No orders of the defined types were placed in this encounter.    Requested Prescriptions   Signed Prescriptions Disp Refills  . clindamycin (CLEOCIN) 300 MG capsule  21 capsule 1    Sig: Take 1 capsule (300 mg total) by mouth 3 (three) times daily.  . traMADol (ULTRAM) 50 MG tablet 15 tablet 0    Sig: Take 1 tablet (50 mg total) by mouth every 8 (eight) hours as needed.    Follow-up as needed Karle Plumber, MD, Rosalita Chessman

## 2017-05-23 NOTE — Patient Instructions (Signed)
Take the Clindamycin as prescribed.  Use the Tramadol as needed for pain. This medication can cause some drowsiness, so do not take it when you have to drive or operate any machinery.  Keep dental appointment.

## 2017-06-10 ENCOUNTER — Ambulatory Visit: Payer: Self-pay | Admitting: Internal Medicine

## 2017-06-17 MED FILL — CLINDAMYCIN HCL 300 MG CAP: 300 | 7 days supply | Qty: 21 | Fill #0

## 2017-06-27 ENCOUNTER — Ambulatory Visit: Payer: Self-pay | Admitting: Internal Medicine

## 2017-08-12 ENCOUNTER — Ambulatory Visit: Payer: Self-pay | Admitting: Internal Medicine

## 2017-08-14 ENCOUNTER — Ambulatory Visit: Payer: Self-pay | Attending: Internal Medicine | Admitting: Physician Assistant

## 2017-08-14 VITALS — BP 172/122 | HR 70 | Temp 97.9°F | Resp 18 | Ht 76.0 in | Wt 224.0 lb

## 2017-08-14 DIAGNOSIS — R5383 Other fatigue: Secondary | ICD-10-CM

## 2017-08-14 DIAGNOSIS — I10 Essential (primary) hypertension: Secondary | ICD-10-CM

## 2017-08-14 DIAGNOSIS — W57XXXA Bitten or stung by nonvenomous insect and other nonvenomous arthropods, initial encounter: Secondary | ICD-10-CM

## 2017-08-14 DIAGNOSIS — R21 Rash and other nonspecific skin eruption: Secondary | ICD-10-CM

## 2017-08-14 DIAGNOSIS — R6884 Jaw pain: Secondary | ICD-10-CM

## 2017-08-14 DIAGNOSIS — S80861A Insect bite (nonvenomous), right lower leg, initial encounter: Secondary | ICD-10-CM

## 2017-08-14 MED ORDER — CARVEDILOL 12.5 MG PO TABS: 12.5000 mg | ORAL_TABLET | Freq: Two times a day (BID) | ORAL | 2 refills | Status: DC

## 2017-08-14 MED ORDER — CETIRIZINE HCL 10 MG PO TABS: 10.0000 mg | ORAL_TABLET | Freq: Every day | ORAL | 1 refills | Status: DC

## 2017-08-14 MED ORDER — AMLODIPINE BESYLATE 10 MG PO TABS: 10.0000 mg | ORAL_TABLET | Freq: Every day | ORAL | 1 refills | Status: DC

## 2017-08-14 MED ORDER — NYSTATIN 100000 UNIT/GM EX POWD
Freq: Two times a day (BID) | CUTANEOUS | 2 refills | Status: DC
Start: 2017-08-14 — End: 2017-12-31

## 2017-08-14 MED ORDER — SPIRONOLACTONE 50 MG PO TABS: 50.0000 mg | ORAL_TABLET | Freq: Every day | ORAL | 1 refills | Status: DC

## 2017-08-14 MED FILL — SPIRONOLACTONE 50 MG TABLET: 50 | 30 days supply | Qty: 30 | Fill #1

## 2017-08-14 MED FILL — AMLODIPINE BESYLATE 10 MG T: 10 | 30 days supply | Qty: 30 | Fill #1

## 2017-08-14 MED FILL — ?CARVEDILOL 12.5 MG TABLET: 12.5 | 30 days supply | Qty: 60 | Fill #1

## 2017-08-14 NOTE — Progress Notes (Signed)
Patient ID: [name], male   DOB: 26-Aug-1975, 42 y.o.   MRN: [id number]      [name], is a 42 y.o. male  [id number]:[id number]  [id number]  DOB - 07/24/1975  Subjective:  Chief Complaint and HPI: [name] is a 42 y.o. male here today for insect sting to RLL 2 days ago.  Some pain, itching and swelling since.  No s/sx anaphylaxis.    Out of Bp meds X 3 weeks.  Some HA.  No dizziness.  No CP.   Rash on upper inner thighs-needs RF on cream.    ROS:   Constitutional:  No f/c, No night sweats, No unexplained weight loss. EENT:  No vision changes, No blurry vision, No hearing changes. No mouth, throat, or ear problems.  Respiratory: No cough, No SOB Cardiac: No CP, no palpitations GI:  No abd pain, No N/V/D. GU: No Urinary s/sx Musculoskeletal: No joint pain Neuro: some headache, no dizziness, no motor weakness.  Skin: No rash Endocrine:  No polydipsia. No polyuria.  Psych: Denies SI/HI  No problems updated.  ALLERGIES:  PAST MEDICAL HISTORY: Past Medical History:  Diagnosis Date  . Hypertension 2003   dx age 77   . Wears glasses     MEDICATIONS AT HOME: Prior to Admission medications   Medication Sig Start Date End Date Taking? Authorizing Provider  amLODipine (NORVASC) 10 MG tablet Take 1 tablet (10 mg total) by mouth daily. 08/14/17  Yes Freeman Caldron M, PA-C  carvedilol (COREG) 12.5 MG tablet Take 1 tablet (12.5 mg total) by mouth 2 (two) times daily with a meal. 08/14/17  Yes McClung, Angela M, PA-C  diclofenac sodium (VOLTAREN) 1 % GEL Apply 2 g topically 4 (four) times daily. 11/08/16  Yes Ladell Pier, MD  tiZANidine (ZANAFLEX) 4 MG tablet Take 1 tablet (4 mg total) by mouth every 6 (six) hours as needed for muscle spasms. 05/14/17  Yes Gildardo Pounds, NP  cetirizine (ZYRTEC) 10 MG tablet Take 1 tablet (10 mg total) by mouth daily. X 2 weeks 08/14/17   Argentina Donovan, PA-C  nystatin (MYCOSTATIN/NYSTOP) powder Apply topically 2 (two) times daily.  08/14/17   Argentina Donovan, PA-C  spironolactone (ALDACTONE) 50 MG tablet Take 1 tablet (50 mg total) by mouth daily. 08/14/17   Argentina Donovan, PA-C  traMADol (ULTRAM) 50 MG tablet Take 1 tablet (50 mg total) by mouth every 8 (eight) hours as needed. Patient not taking: Reported on 08/14/2017 05/23/17   Ladell Pier, MD  promethazine (PHENERGAN) 25 MG tablet Take 1 tablet (25 mg total) by mouth every 6 (six) hours as needed for nausea or vomiting. Patient not taking: Reported on 06/22/2014 05/28/14 02/22/15  Alvina Chou, PA-C     Objective:  EXAM:   Vitals:   08/14/17 1610  BP: (!) 172/122  Pulse: 70  Resp: 18  Temp: 97.9 F (36.6 C)  TempSrc: Oral  SpO2: 99%  Weight: 224 lb (101.6 kg)  Height: 6\' 4"  (1.93 m)    General appearance : A&OX3. NAD. Non-toxic-appearing HEENT: Atraumatic and Normocephalic.  PERRLA. EOM intact.  Airway is patent.  Neck: supple, no JVD. No cervical lymphadenopathy. No thyromegaly Chest/Lungs:  Breathing-non-labored, Good air entry bilaterally, breath sounds normal without rales, rhonchi, or wheezing  CVS: S1 S2 regular, no murmurs, gallops, rubs  Extremities: Bilateral Lower Ext shows no edema, both legs are warm to touch with = pulse throughout Neurology:  CN II-XII grossly intact, Non focal.  Psych:  TP linear. J/I WNL. Normal speech. Appropriate eye contact and affect.  Skin:  Tinea Rash upper and inner thighs.  R lower lateral leg about mid way down, there is a bite/sting site with mild surrounding erythema.  No induration or sign of cellulitis  Data Review No results found for: HGBA1C   Assessment & Plan   1. Insect bite of right lower leg, initial encounter No sign of infection.  Typical localized reaction - cetirizine (ZYRTEC) 10 MG tablet; Take 1 tablet (10 mg total) by mouth daily. X 2 weeks  Dispense: 30 tablet; Refill: 1  2. Essential hypertension Uncontrolled and out of meds-resume meds.(he was offered clonidine in the  office today.  R/b were explained and he refused the medication.) - Comprehensive metabolic panel - amLODipine (NORVASC) 10 MG tablet; Take 1 tablet (10 mg total) by mouth daily.  Dispense: 90 tablet; Refill: 1 - carvedilol (COREG) 12.5 MG tablet; Take 1 tablet (12.5 mg total) by mouth 2 (two) times daily with a meal.  Dispense: 180 tablet; Refill: 2 - spironolactone (ALDACTONE) 50 MG tablet; Take 1 tablet (50 mg total) by mouth daily.  Dispense: 90 tablet; Refill: 1  3. Fatigue, unspecified type - TSH - CBC with Differential/Platelet - Vitamin D, 25-hydroxy   4. Rash of groin - nystatin (MYCOSTATIN/NYSTOP) powder; Apply topically 2 (two) times daily.  Dispense: 45 g; Refill: 2   Patient have been counseled extensively about nutrition and exercise  Return for appointment with Lurena Joiner in 3 weeks for BP and with Dr Margarita Rana in 3 months for follow-up.  The patient was given clear instructions to go to ER or return to medical center if symptoms don't improve, worsen or new problems develop. The patient verbalized understanding. The patient was told to call to get lab results if they haven't heard anything in the next week.     Freeman Caldron, PA-C [facility] and [facility] [place], [place]   08/14/2017, 4:23 PM

## 2017-08-14 NOTE — Patient Instructions (Signed)
Check blood pressure outside of the office 3 to 4 times weekly and record and bring to your next visit.     Bee, Wasp, or [employer], Adult Bees, wasps, and hornets are part of a family of insects that can sting people. These stings can cause pain and inflammation, but they are usually not serious. However, some people may have an allergic reaction to a sting. This can cause the symptoms to be more severe. What increases the risk? You may be at a greater risk of getting stung if you:  Provoke a stinging insect by swatting or disturbing it.  Wear strong-smelling soaps, deodorants, or body sprays.  Spend time outdoors near gardens with flowers or fruit trees or in clothes that expose skin.  Eat or drink outside.  What are the signs or symptoms? Common symptoms of this condition include:  A red lump in the skin that sometimes has a tiny hole in the center. In some cases, a stinger may be in the center of the wound.  Pain and itching at the sting site.  Redness and swelling around the sting site. If you have an allergic reaction (localized allergic reaction), the swelling and redness may spread out from the sting site. In some cases, this reaction can continue to develop over the next 24-48 hours.  In rare cases, a person may have a severe allergic reaction (anaphylactic reaction) to a sting. Symptoms of an anaphylactic reaction may include:  Wheezing or difficulty breathing.  Raised, itchy, red patches on the skin (hives).  Nausea or vomiting.  Abdominal cramping.  Diarrhea.  Tightness in the chest or chest pain.  Dizziness or fainting.  Redness of the face (flushing).  Hoarse voice.  Swollen tongue, lips, or face.  How is this diagnosed? This condition is usually diagnosed based on your symptoms and medical history as well as a physical exam. You may have an allergy test to determine if you are allergic to the substance that the insect injected during the sting  (venom). How is this treated? If you were stung by a bee, the stinger and a small sac of venom may be in the wound. It is important to remove the stinger as soon as possible. You can do this by brushing across the wound with gauze, a fingernail, or a flat card such as a credit card. Removing the stinger can help reduce the severity of your body's reaction to the sting. Most stings can be treated with:  Icing to reduce swelling in the area.  Medicines (antihistamines) to treat itching or an allergic reaction.  Medicines to help reduce pain. These may be medicines that you take by mouth, or medicated creams or lotions that you apply to your skin.  Pay close attention to your symptoms after you have been stung. If possible, have someone stay with you to make sure you do not have an allergic reaction. If you have any signs of an allergic reaction, call your health care provider. If you have ever had a severe allergic reaction, your health care provider may give you an inhaler or injectable medicine (epinephrine auto-injector) to use if necessary. Follow these instructions at home:  Wash the sting site 2-3 times each day with soap and water as told by your health care provider.  Apply or take over-the-counter and prescription medicines only as told by your health care provider.  If directed, apply ice to the sting area. ? Put ice in a plastic bag. ? Place a towel  between your skin and the bag. ? Leave the ice on for 20 minutes, 2-3 times a day.  Do not scratch the sting area.  If you had a severe allergic reaction to a sting, you may need: ? To wear a medical bracelet or necklace that lists the allergy. ? To learn when and how to use an anaphylaxis kit or epinephrine injection. Your family members and coworkers may also need to learn this. ? To carry an anaphylaxis kit or epinephrine injection with you at all times. How is this prevented?  Avoid swatting at stinging insects and disturbing  insect nests.  Do not use fragrant soaps or lotions.  Wear shoes, pants, and long sleeves when spending time outdoors, especially in grassy areas where stinging insects are common.  Keep outdoor areas free from nests or hives.  Keep food and drink containers covered when eating outdoors.  Avoid working or sitting near [employer], if possible.  Wear gloves if you are gardening or working outdoors.  If an attack by a stinging insect or a swarm seems likely in the moment, move away from the area or find a barrier between you and the insect(s), such as a door. Contact a health care provider if:  Your symptoms do not get better in 2-3 days.  You have redness, swelling, or pain that spreads beyond the area of the sting.  You have a fever. Get help right away if: You have symptoms of a severe allergic reaction. These include:  Wheezing or difficulty breathing.  Tightness in the chest or chest pain.  Light-headedness or fainting.  Itchy, raised, red patches on the skin.  Nausea or vomiting.  Abdominal cramping.  Diarrhea.  A swollen tongue or lips, or trouble swallowing.  Dizziness or fainting.  Summary  Stings from bees, wasps, and hornets can cause pain and inflammation, but they are usually not serious. However, some people may have an allergic reaction to a sting. This can cause the symptoms to be more severe.  Pay close attention to your symptoms after you have been stung. If possible, have someone stay with you to make sure you do not have an allergic reaction.  Call your health care provider if you have any signs of an allergic reaction. This information is not intended to replace advice given to you by your health care provider. Make sure you discuss any questions you have with your health care provider. Document Released: 02/26/2005 Document Revised: 05/03/2016 Document Reviewed: 05/03/2016 Elsevier Interactive Patient Education  [employer].

## 2017-08-15 ENCOUNTER — Telehealth: Payer: Self-pay | Admitting: *Deleted

## 2017-08-15 ENCOUNTER — Other Ambulatory Visit: Payer: Self-pay | Admitting: Physician Assistant

## 2017-08-15 LAB — CBC WITH DIFFERENTIAL/PLATELET
BASOS ABS: 0.1 10*3/uL (ref 0.0–0.2)
BASOS: 1 %
EOS (ABSOLUTE): 0.2 10*3/uL (ref 0.0–0.4)
Eos: 3 %
HEMOGLOBIN: 16.5 g/dL (ref 13.0–17.7)
Hematocrit: 46.7 % (ref 37.5–51.0)
IMMATURE GRANS (ABS): 0 10*3/uL (ref 0.0–0.1)
Immature Granulocytes: 0 %
LYMPHS ABS: 3.9 10*3/uL — AB (ref 0.7–3.1)
LYMPHS: 44 %
MCH: 29.8 pg (ref 26.6–33.0)
MCHC: 35.3 g/dL (ref 31.5–35.7)
MCV: 84 fL (ref 79–97)
Monocytes Absolute: 0.5 10*3/uL (ref 0.1–0.9)
Monocytes: 6 %
NEUTROS ABS: 4.1 10*3/uL (ref 1.4–7.0)
Neutrophils: 46 %
Platelets: 289 10*3/uL (ref 150–450)
RBC: 5.54 x10E6/uL (ref 4.14–5.80)
RDW: 15 % (ref 12.3–15.4)
WBC: 8.8 10*3/uL (ref 3.4–10.8)

## 2017-08-15 LAB — COMPREHENSIVE METABOLIC PANEL
ALBUMIN: 4.3 g/dL (ref 3.5–5.5)
ALT: 23 IU/L (ref 0–44)
AST: 19 IU/L (ref 0–40)
Albumin/Globulin Ratio: 1.2 (ref 1.2–2.2)
Alkaline Phosphatase: 91 IU/L (ref 39–117)
BILIRUBIN TOTAL: 0.4 mg/dL (ref 0.0–1.2)
BUN / CREAT RATIO: 10 (ref 9–20)
BUN: 11 mg/dL (ref 6–24)
CHLORIDE: 101 mmol/L (ref 96–106)
CO2: 27 mmol/L (ref 20–29)
CREATININE: 1.07 mg/dL (ref 0.76–1.27)
Calcium: 9.9 mg/dL (ref 8.7–10.2)
GFR calc non Af Amer: 85 mL/min/{1.73_m2} (ref >59)
GFR, EST AFRICAN AMERICAN: 98 mL/min/{1.73_m2} (ref >59)
GLOBULIN, TOTAL: 3.6 g/dL (ref 1.5–4.5)
Glucose: 90 mg/dL (ref 65–99)
Potassium: 4.4 mmol/L (ref 3.5–5.2)
SODIUM: 142 mmol/L (ref 134–144)
TOTAL PROTEIN: 7.9 g/dL (ref 6.0–8.5)

## 2017-08-15 LAB — VITAMIN D 25 HYDROXY (VIT D DEFICIENCY, FRACTURES): Vit D, 25-Hydroxy: 24.8 ng/mL — ABNORMAL LOW (ref 30.0–100.0)

## 2017-08-15 LAB — TSH: TSH: 0.618 u[IU]/mL (ref 0.450–4.500)

## 2017-08-15 MED ORDER — VITAMIN D (ERGOCALCIFEROL) 1.25 MG (50000 UNIT) PO CAPS: 50000.0000 [IU] | ORAL_CAPSULE | ORAL | 0 refills | Status: DC

## 2017-08-15 NOTE — Telephone Encounter (Signed)
Patient verified DOB Patient is aware of vitamin d level being low and needing to pick up weekly prescription to take. Recheck will be completed in 3-4 months. All other labs normal. No further questions.

## 2017-08-15 NOTE — Telephone Encounter (Signed)
-----   Message from Argentina Donovan, [place] sent at 08/15/2017  8:02 AM EDT ----- Please call patient and let them that their vitamin D is low.  This can contribute to muscle aches, anxiety, fatigue, and depression.  I have sent a prescription to the pharmacy for them to take once a week.  We will recheck this level in 3-4 months.   All other labs are normal. Thanks, Freeman Caldron, PA-C

## 2017-10-19 ENCOUNTER — Other Ambulatory Visit: Payer: Self-pay

## 2017-10-19 ENCOUNTER — Encounter (HOSPITAL_COMMUNITY): Payer: Self-pay

## 2017-10-19 ENCOUNTER — Emergency Department (HOSPITAL_COMMUNITY)
Admission: EM | Admit: 2017-10-19 | Discharge: 2017-10-19 | Disposition: A | Discharge status: Home / Self Care | Payer: Self-pay | Attending: Emergency Medicine | Admitting: Emergency Medicine

## 2017-10-19 DIAGNOSIS — K0401 Reversible pulpitis: Secondary | ICD-10-CM | POA: Insufficient documentation

## 2017-10-19 DIAGNOSIS — Z87891 Personal history of nicotine dependence: Secondary | ICD-10-CM | POA: Insufficient documentation

## 2017-10-19 DIAGNOSIS — Z79899 Other long term (current) drug therapy: Secondary | ICD-10-CM | POA: Insufficient documentation

## 2017-10-19 DIAGNOSIS — I160 Hypertensive urgency: Secondary | ICD-10-CM | POA: Insufficient documentation

## 2017-10-19 MED ORDER — CLINDAMYCIN HCL 300 MG PO CAPS: 300.0000 mg | ORAL_CAPSULE | Freq: Four times a day (QID) | ORAL | 0 refills | Status: DC

## 2017-10-19 MED ORDER — CLINDAMYCIN HCL 300 MG PO CAPS
300.0000 mg | ORAL_CAPSULE | Freq: Once | ORAL | Status: AC
Start: 2017-10-19 — End: 2017-10-19
  Administered 2017-10-19: 300 mg via ORAL
  Filled 2017-10-19: qty 1

## 2017-10-19 NOTE — Discharge Instructions (Addendum)
If your pain worsens, you develop shortness of breath, fever, trouble swallowing, facial swelling, or any other new/concerning symptoms return to the ER for evaluation.  Otherwise follow-up with your dentist in 2 days.  Be sure to check your blood pressure at home and record the readings.  You will need to follow-up with your primary care physician for outpatient medication change.  If you develop headache, vomiting, dizziness, blurry vision, weakness or numbness in your arms or legs, chest pain, or any other new/concerning symptoms and return to the ER for evaluation.

## 2017-10-19 NOTE — ED Triage Notes (Addendum)
Patient c/o right upper dental pain x 3 days.  Patient's BP elevated in triage. patient states he took BP meds at 0700.

## 2017-10-19 NOTE — ED Provider Notes (Signed)
[facility] DEPT Provider Note   CSN: [id number] Arrival date & time: 10/19/17  1520     History   Chief Complaint Chief Complaint  Patient presents with  . Dental Pain  . Hypertension    HPI [name] is a 42 y.o. male.  HPI  42 year old male with a history of hypertension and chronic dental issues presents with recurrent dental pain.  He states that his right upper teeth are hurting.  Started after drinking coffee 2 days ago.  Significantly worse today to the point that it was a 20 on the pain scale.  He uses Listerine and 800 mg ibuprofen and states the pain is now down to about a 6.  When the pain was it is worse he was also having associated right-sided headache and transient dizziness but this is resolved.  He has been taking his blood pressure medicine as prescribed and last took this morning but did take it earlier than usual and missed his typical afternoon dose which is after lunch.  No vomiting, shortness of breath, chest pain, current headache, weakness or numbness in the extremities or blurry vision.  No drainage from his teeth.  No swelling noted.  Past Medical History:  Diagnosis Date  . Hypertension 2003   dx age 45   . Wears glasses     Patient Active Problem List   Diagnosis Date Noted  . Inguinal hernia of left side without obstruction or gangrene 12/08/2016  . Tobacco use 12/08/2016  . Localized swelling, mass and lump, multiple sites 12/08/2016  . Subcutaneous nodules 07/09/2016  . Acute left-sided thoracic back pain 02/16/2016  . Left inguinal hernia 12/12/2015  . Poor dentition 05/27/2015  . Periodontitis 05/27/2015  . Pain in left wrist 05/27/2015  . HTN (hypertension) 05/12/2015    Past Surgical History:  Procedure Laterality Date  . CHOLECYSTECTOMY          Home Medications    Prior to Admission medications   Medication Sig Start Date End Date Taking? Authorizing Provider  amLODipine (NORVASC) 10 MG  tablet Take 1 tablet (10 mg total) by mouth daily. 08/14/17  Yes Freeman Caldron M, PA-C  carvedilol (COREG) 12.5 MG tablet Take 1 tablet (12.5 mg total) by mouth 2 (two) times daily with a meal. 08/14/17  Yes McClung, Angela M, PA-C  nystatin (MYCOSTATIN/NYSTOP) powder Apply topically 2 (two) times daily. 08/14/17  Yes Argentina Donovan, PA-C  spironolactone (ALDACTONE) 50 MG tablet Take 1 tablet (50 mg total) by mouth daily. 08/14/17  Yes Argentina Donovan, PA-C  cetirizine (ZYRTEC) 10 MG tablet Take 1 tablet (10 mg total) by mouth daily. X 2 weeks Patient not taking: Reported on 10/19/2017 08/14/17   Argentina Donovan, PA-C  clindamycin (CLEOCIN) 300 MG capsule Take 1 capsule (300 mg total) by mouth 4 (four) times daily. 10/19/17   Sherwood Gambler, MD  diclofenac sodium (VOLTAREN) 1 % GEL Apply 2 g topically 4 (four) times daily. Patient not taking: Reported on 10/19/2017 11/08/16   Ladell Pier, MD  tiZANidine (ZANAFLEX) 4 MG tablet Take 1 tablet (4 mg total) by mouth every 6 (six) hours as needed for muscle spasms. Patient not taking: Reported on 10/19/2017 05/14/17   Gildardo Pounds, NP  traMADol (ULTRAM) 50 MG tablet Take 1 tablet (50 mg total) by mouth every 8 (eight) hours as needed. Patient not taking: Reported on 10/19/2017 05/23/17   Ladell Pier, MD  Vitamin D, Ergocalciferol, (DRISDOL) 50000 units  CAPS capsule Take 1 capsule (50,000 Units total) by mouth every 7 (seven) days. Patient not taking: Reported on 10/19/2017 08/15/17   Argentina Donovan, PA-C  promethazine (PHENERGAN) 25 MG tablet Take 1 tablet (25 mg total) by mouth every 6 (six) hours as needed for nausea or vomiting. Patient not taking: Reported on 06/22/2014 05/28/14 02/22/15  Alvina Chou, PA-C    Family History Family History  Problem Relation Age of Onset  . Cancer Mother     Social History Social History   Tobacco Use  . Smoking status: Former Smoker    Packs/day: 1.00    Types: Cigars    Last attempt to  quit: 05/10/2015    Years since quitting: 2.4  . Smokeless tobacco: Never Used  Substance Use Topics  . Alcohol use: Yes    Comment: occ  . Drug use: No     Allergies   Penicillins   Review of Systems Review of Systems  Constitutional: Negative for fever.  HENT: Negative for facial swelling.   Eyes: Negative for visual disturbance.  Respiratory: Negative for shortness of breath.   Cardiovascular: Negative for chest pain.  Gastrointestinal: Negative for abdominal pain and vomiting.  Neurological: Positive for dizziness and headaches. Negative for weakness and numbness.  All other systems reviewed and are negative.    Physical Exam Updated Vital Signs BP (!) 173/103   Pulse 64   Temp 98.3 F (36.8 C) (Oral)   Resp 16   Ht 6' 2.5" (1.892 m)   Wt 104.3 kg   SpO2 100%   BMI 29.14 kg/m   Physical Exam  Constitutional: He is oriented to person, place, and time. He appears well-developed and well-nourished. No distress.  HENT:  Head: Normocephalic and atraumatic.  Right Ear: External ear normal.  Left Ear: External ear normal.  Nose: Nose normal.  Mouth/Throat: Uvula is midline and oropharynx is clear and moist. No trismus in the jaw. Abnormal dentition. Dental caries present. No dental abscesses or uvula swelling.  Several broken teeth down to gum line, including over right incisors. Mild gum tenderness but no swelling, redness or drainage.   Eyes: Pupils are equal, round, and reactive to light. EOM are normal. Right eye exhibits no discharge. Left eye exhibits no discharge.  Neck: Neck supple.  Cardiovascular: Normal rate, regular rhythm, normal heart sounds and intact distal pulses.  Pulmonary/Chest: Effort normal and breath sounds normal.  Abdominal: Soft. There is no tenderness.  Musculoskeletal: He exhibits no edema.  Neurological: He is alert and oriented to person, place, and time.  CN 3-12 grossly intact. 5/5 strength in all 4 extremities. Grossly normal  sensation. Normal finger to nose.   Skin: Skin is warm and dry. He is not diaphoretic.  Nursing note and vitals reviewed.    ED Treatments / Results  Labs (all labs ordered are listed, but only abnormal results are displayed) Labs Reviewed - No data to display  EKG None  Radiology No results found.  Procedures Procedures (including critical care time)  Medications Ordered in ED Medications  clindamycin (CLEOCIN) capsule 300 mg (has no administration in time range)     Initial Impression / Assessment and Plan / ED Course  I have reviewed the triage vital signs and the nursing notes.  Pertinent labs & imaging results that were available during my care of the patient were reviewed by me and considered in my medical decision making (see chart for details).     Dental pain is probably acute  pulpitis given the time onset.  This is likely an acute on chronic issue for him.  He is concerned it may be infected which is reasonable given the poor overall dentition and I will treat him with clindamycin given his penicillin allergy.  Continue to use NSAIDs as this seems to be helping.  Otherwise he does not have any other acute findings such as facial swelling or trouble swallowing/trismus.  He is noted to be quite hypertensive with an initial blood pressure over 333 systolic.  However I think this is partially pain related and partially related to his chronic hypertension.  He had transient dizziness and headache with the severe pain but I do not think this indicates an acute neurologic problem or acute hypertensive emergency.  Otherwise his exam is benign and his blood pressures are improving now this pain is better.  I discussed follow-up for the hypertension as well as close follow-up with his dentist, who he states he will see in 2 days on Monday.  Return precautions.  Final Clinical Impressions(s) / ED Diagnoses   Final diagnoses:  Acute pulpitis  Hypertensive urgency    ED  Discharge Orders         Ordered    clindamycin (CLEOCIN) 300 MG capsule  4 times daily     10/19/17 1621           Sherwood Gambler, MD 10/19/17 1627

## 2017-10-24 ENCOUNTER — Other Ambulatory Visit: Payer: Self-pay | Admitting: Family Medicine

## 2017-10-24 DIAGNOSIS — R0789 Other chest pain: Secondary | ICD-10-CM

## 2017-10-24 DIAGNOSIS — R6884 Jaw pain: Secondary | ICD-10-CM

## 2017-10-24 MED FILL — AMLODIPINE BESYLATE 10 MG T: 10 | 30 days supply | Qty: 30 | Fill #0

## 2017-10-24 MED FILL — SPIRONOLACTONE 50 MG TABLET: 50 | 30 days supply | Qty: 30 | Fill #0

## 2017-10-24 MED FILL — NYSTOP 100,000 UNITS/GM PWD: 100000 | 7 days supply | Qty: 15 | Fill #0

## 2017-10-24 MED FILL — CARVEDILOL 12.5 MG TABLET: 12.5 | 30 days supply | Qty: 60 | Fill #0

## 2017-10-30 ENCOUNTER — Ambulatory Visit: Payer: Self-pay

## 2017-11-04 ENCOUNTER — Ambulatory Visit: Payer: Self-pay

## 2017-12-31 ENCOUNTER — Encounter: Payer: Self-pay | Admitting: Internal Medicine

## 2017-12-31 ENCOUNTER — Ambulatory Visit: Payer: Self-pay | Attending: Internal Medicine | Admitting: Internal Medicine

## 2017-12-31 VITALS — BP 170/111 | HR 67 | Temp 97.9°F | Resp 16 | Wt 231.0 lb

## 2017-12-31 DIAGNOSIS — Z880 Allergy status to penicillin: Secondary | ICD-10-CM | POA: Insufficient documentation

## 2017-12-31 DIAGNOSIS — R0683 Snoring: Secondary | ICD-10-CM | POA: Insufficient documentation

## 2017-12-31 DIAGNOSIS — I160 Hypertensive urgency: Secondary | ICD-10-CM | POA: Insufficient documentation

## 2017-12-31 DIAGNOSIS — Z79899 Other long term (current) drug therapy: Secondary | ICD-10-CM | POA: Insufficient documentation

## 2017-12-31 DIAGNOSIS — Z2821 Immunization not carried out because of patient refusal: Secondary | ICD-10-CM

## 2017-12-31 DIAGNOSIS — K0530 Chronic periodontitis, unspecified: Secondary | ICD-10-CM | POA: Insufficient documentation

## 2017-12-31 DIAGNOSIS — R51 Headache: Secondary | ICD-10-CM | POA: Insufficient documentation

## 2017-12-31 DIAGNOSIS — Z87891 Personal history of nicotine dependence: Secondary | ICD-10-CM | POA: Insufficient documentation

## 2017-12-31 DIAGNOSIS — R519 Headache, unspecified: Secondary | ICD-10-CM

## 2017-12-31 DIAGNOSIS — I10 Essential (primary) hypertension: Secondary | ICD-10-CM | POA: Insufficient documentation

## 2017-12-31 DIAGNOSIS — L309 Dermatitis, unspecified: Secondary | ICD-10-CM | POA: Insufficient documentation

## 2017-12-31 DIAGNOSIS — Z760 Encounter for issue of repeat prescription: Secondary | ICD-10-CM | POA: Insufficient documentation

## 2017-12-31 MED ORDER — CLONIDINE HCL 0.1 MG PO TABS
0.1000 mg | ORAL_TABLET | Freq: Once | ORAL | Status: AC
  Administered 2017-12-31: 0.1 mg via ORAL

## 2017-12-31 MED ORDER — TERBINAFINE HCL 1 % EX CREA: TOPICAL_CREAM | CUTANEOUS | 2 refills | Status: DC

## 2017-12-31 NOTE — Patient Instructions (Signed)
Please give her a follow-up appointment with our clinical pharmacist in 1 week for repeat blood pressure check.   Your blood pressure is uncontrolled.  Please stop by the pharmacy today to pick up your other 2 medications.  Try to check your blood pressure at least 2-3 times a week.  The goal is 130/80 or lower.  Please apply for the orange card/cone discount card after which we can refer you for a sleep study to evaluate for possible sleep apnea.

## 2017-12-31 NOTE — Progress Notes (Signed)
Patient ID: [name], male    DOB: 12/23/1975  MRN: [id number]  CC: Hypertension and Medication Refill   Subjective: [name] is a 42 y.o. male who presents for chronic disease management His concerns today include:  Hx of poorly HTN, tob dep, poor dentition  HTN:  Reports compliance with meds but ran out of all except Coreg yesterday. Has wrist cuff.  Checks BP occasionally.  Last reading 120s/92.  Limits salt in foods Endorses HA in mornings and sometime after work. Told that he snores loud. Feels unrefresh 50% of the times in the mornings.   Rash on upper inner thighs not getting any better or worse with Nystatin.  Worse in the evenings when he settles down for bed.  It itches.  He states that he will be meeting with our financial specialist later this week hopefully he will be approved for the orange card or cone discount.  An outstanding issue is a needed referral to ENT for evaluation of small mass that was seen in the right nasal cavity on CAT scan of the head done back in 2016.  Patient Active Problem List   Diagnosis Date Noted  . Inguinal hernia of left side without obstruction or gangrene 12/08/2016  . Tobacco use 12/08/2016  . Localized swelling, mass and lump, multiple sites 12/08/2016  . Subcutaneous nodules 07/09/2016  . Acute left-sided thoracic back pain 02/16/2016  . Left inguinal hernia 12/12/2015  . Poor dentition 05/27/2015  . Periodontitis 05/27/2015  . Pain in left wrist 05/27/2015  . HTN (hypertension) 05/12/2015     Current Outpatient Medications on File Prior to Visit  Medication Sig Dispense Refill  . amLODipine (NORVASC) 10 MG tablet Take 1 tablet (10 mg total) by mouth daily. 90 tablet 1  . carvedilol (COREG) 12.5 MG tablet Take 1 tablet (12.5 mg total) by mouth 2 (two) times daily with a meal. 180 tablet 2  . naproxen (NAPROSYN) 500 MG tablet Take 1 tablet (500 mg total) by mouth 2 (two) times daily as needed. Take with food. 40  tablet 0  . spironolactone (ALDACTONE) 50 MG tablet Take 1 tablet (50 mg total) by mouth daily. 90 tablet 1  . Vitamin D, Ergocalciferol, (DRISDOL) 50000 units CAPS capsule Take 1 capsule (50,000 Units total) by mouth every 7 (seven) days. (Patient not taking: Reported on 10/19/2017) 16 capsule 0   No current facility-administered medications on file prior to visit.     Allergies  Allergen Reactions  . Penicillins Swelling and Rash    Has patient had a PCN reaction causing immediate rash, facial/tongue/throat swelling, SOB or lightheadedness with hypotension: Yes/No:[id number] Yes Has patient had a PCN reaction causing severe rash involving mucus membranes or skin necrosis: yes/No:[id number] No Has patient had a PCN reaction that required hospitalization Yes/No:[id number] Yes Has patient had a PCN reaction occurring within the last 10 years: Yes/No:[id number] No If all of the above answers are "NO", then may proceed with Cephalospori    Social History   Socioeconomic History  . Marital status: Single    Spouse name: Not on file  . Number of children: Not on file  . Years of education: Not on file  . Highest education level: Not on file  Occupational History  . Not on file  Social Needs  . Financial resource strain: Not on file  . Food insecurity:    Worry: Not on file    Inability: Not on file  . Transportation needs:    Medical: Not on  file    Non-medical: Not on file  Tobacco Use  . Smoking status: Former Smoker    Packs/day: 1.00    Types: Cigars    Last attempt to quit: 05/10/2015    Years since quitting: 2.6  . Smokeless tobacco: Never Used  Substance and Sexual Activity  . Alcohol use: Yes    Comment: occ  . Drug use: No  . Sexual activity: Not on file  Lifestyle  . Physical activity:    Days per week: Not on file    Minutes per session: Not on file  . Stress: Not on file  Relationships  . Social connections:    Talks on phone: Not on file    Gets together: Not on file     Attends religious service: Not on file    Active member of club or organization: Not on file    Attends meetings of clubs or organizations: Not on file    Relationship status: Not on file  . Intimate partner violence:    Fear of current or ex partner: Not on file    Emotionally abused: Not on file    Physically abused: Not on file    Forced sexual activity: Not on file  Other Topics Concern  . Not on file  Social History Narrative  . Not on file    Family History  Problem Relation Age of Onset  . Cancer Mother     Past Surgical History:  Procedure Laterality Date  . CHOLECYSTECTOMY      ROS: Review of Systems Negative except as stated above PHYSICAL EXAM: BP (!) 170/111 (BP Location: Right Arm, Cuff Size: Large) Comment: recheck  Pulse 67   Temp 97.9 F (36.6 C) (Oral)   Resp 16   Wt 231 lb (104.8 kg)   SpO2 99%   BMI 29.26 kg/m   Wt Readings from Last 3 Encounters:  12/31/17 231 lb (104.8 kg)  10/19/17 230 lb (104.3 kg)  08/14/17 224 lb (101.6 kg)   Physical Exam General appearance - alert, well appearing, and in no distress Mental status - normal mood, behavior, speech, dress, motor activity, and thought processes Neck - supple, no significant adenopathy Chest - clear to auscultation, no wheezes, rales or rhonchi, symmetric air entry Heart - normal rate, regular rhythm, normal S1, S2, no murmurs, rubs, clicks or gallops Extremities - peripheral pulses normal, no pedal edema, no clubbing or cyanosis Neurologic exam: Nonfocal.   Chemistry      Component Value Date/Time   NA 142 08/14/2017 1630   K 4.4 08/14/2017 1630   CL 101 08/14/2017 1630   CO2 27 08/14/2017 1630   BUN 11 08/14/2017 1630   CREATININE 1.07 08/14/2017 1630   CREATININE 0.97 12/12/2015 1306      Component Value Date/Time   CALCIUM 9.9 08/14/2017 1630   ALKPHOS 91 08/14/2017 1630   AST 19 08/14/2017 1630   ALT 23 08/14/2017 1630   BILITOT 0.4 08/14/2017 1630     Lab Results    Component Value Date   WBC 8.8 08/14/2017   HGB 16.5 08/14/2017   HCT 46.7 08/14/2017   MCV 84 08/14/2017   PLT 289 08/14/2017   Depression screen PHQ 2/9 12/31/2017 08/14/2017 05/23/2017  Decreased Interest 0 0 0  Down, Depressed, Hopeless 0 0 0  PHQ - 2 Score 0 0 0  Altered sleeping - - 0  Tired, decreased energy - - 0  Change in appetite - - 0  Feeling  bad or failure about yourself  - - 0  Trouble concentrating - - 0  Moving slowly or fidgety/restless - - 0  Suicidal thoughts - - 0  PHQ-9 Score - - 0  Some recent data might be hidden   GAD 7 : Generalized Anxiety Score 12/31/2017 08/14/2017 05/23/2017 05/14/2017  Nervous, Anxious, on Edge 0 0 0 0  Control/stop worrying 0 0 0 0  Worry too much - different things 0 0 0 0  Trouble relaxing 0 0 0 0  Restless 0 0 0 0  Easily annoyed or irritable 0 0 0 0  Afraid - awful might happen 0 0 0 0  Total GAD 7 Score 0 0 0 0    ASSESSMENT AND PLAN: 1. Hypertensive urgency Encourage patient to avoid running out of his medication.  In the past I have written for 90-day supply so that he does not have to come to the pharmacy every month however patient states that he is on a limited budget.  He plans to go to the pharmacy today to get refills on all of his medications -Given clonidine 0.1 mg today.  Repeat blood pressure was 170/111. We will have him follow-up with the clinical pharmacist in 1 week for repeat blood pressure check - cloNIDine (CATAPRES) tablet 0.1 mg  2. Generalized headache Likely related to uncontrolled blood pressure.  However history also suggest possible underlying sleep apnea.  If he is approved for orange card/cone discount we can refer for sleep study  3. Loud snoring See #2 above  4. Influenza vaccination declined   5. Dermatitis Patient declined letting me have a look at the rash on the upper inner thigh today.  He wanted to be tried with an antifungal cream instead of the nystatin powder.  We will try him with  Lamisil for possible jock itch - terbinafine (LAMISIL) 1 % cream; Apply to affected area twice daily  Dispense: 36 g; Refill: 2   Patient was given the opportunity to ask questions.  Patient verbalized understanding of the plan and was able to repeat key elements of the plan.   No orders of the defined types were placed in this encounter.    Requested Prescriptions   Signed Prescriptions Disp Refills  . terbinafine (LAMISIL) 1 % cream 36 g 2    Sig: Apply to affected area twice daily    Return in about 2 months (around 03/02/2018).  Karle Plumber, MD, FACP

## 2018-01-01 ENCOUNTER — Ambulatory Visit: Payer: Self-pay

## 2018-02-25 ENCOUNTER — Ambulatory Visit: Payer: Self-pay | Admitting: Internal Medicine

## 2018-03-25 ENCOUNTER — Encounter: Payer: Self-pay | Admitting: Internal Medicine

## 2018-03-25 ENCOUNTER — Ambulatory Visit: Payer: Self-pay | Attending: Internal Medicine | Admitting: Internal Medicine

## 2018-03-25 VITALS — BP 180/137 | HR 81 | Temp 98.1°F | Resp 16 | Ht 74.5 in | Wt 221.0 lb

## 2018-03-25 DIAGNOSIS — Z9114 Patient's other noncompliance with medication regimen: Secondary | ICD-10-CM | POA: Insufficient documentation

## 2018-03-25 DIAGNOSIS — M546 Pain in thoracic spine: Secondary | ICD-10-CM

## 2018-03-25 DIAGNOSIS — Z79899 Other long term (current) drug therapy: Secondary | ICD-10-CM | POA: Insufficient documentation

## 2018-03-25 DIAGNOSIS — I10 Essential (primary) hypertension: Secondary | ICD-10-CM

## 2018-03-25 DIAGNOSIS — Z87891 Personal history of nicotine dependence: Secondary | ICD-10-CM | POA: Insufficient documentation

## 2018-03-25 DIAGNOSIS — Z7901 Long term (current) use of anticoagulants: Secondary | ICD-10-CM | POA: Insufficient documentation

## 2018-03-25 DIAGNOSIS — M25552 Pain in left hip: Secondary | ICD-10-CM

## 2018-03-25 DIAGNOSIS — K4090 Unilateral inguinal hernia, without obstruction or gangrene, not specified as recurrent: Secondary | ICD-10-CM

## 2018-03-25 MED ORDER — TERBINAFINE HCL 1 % EX CREA: TOPICAL_CREAM | CUTANEOUS | 2 refills | Status: DC

## 2018-03-25 MED ORDER — SPIRONOLACTONE 50 MG PO TABS: 50.0000 mg | ORAL_TABLET | Freq: Every day | ORAL | 1 refills | Status: DC

## 2018-03-25 MED ORDER — CARVEDILOL 12.5 MG PO TABS: 12.5000 mg | ORAL_TABLET | Freq: Two times a day (BID) | ORAL | 2 refills | Status: DC

## 2018-03-25 MED ORDER — TRAMADOL HCL 50 MG PO TABS: 50.0000 mg | ORAL_TABLET | Freq: Four times a day (QID) | ORAL | 0 refills | Status: DC | PRN

## 2018-03-25 MED ORDER — AMLODIPINE BESYLATE 10 MG PO TABS: 10.0000 mg | ORAL_TABLET | Freq: Every day | ORAL | 1 refills | Status: DC

## 2018-03-25 MED ORDER — CYCLOBENZAPRINE HCL 5 MG PO TABS: 5.0000 mg | ORAL_TABLET | Freq: Two times a day (BID) | ORAL | 0 refills | Status: DC | PRN

## 2018-03-25 NOTE — Progress Notes (Signed)
Patient ID: [name], male    DOB: 08-14-1975  MRN: [id number]  CC: Leg Pain (left)   Subjective: [name] is a 43 y.o. male who presents for chronic ds management His concerns today include:  Hx of poorly HTN, tob dep, poor dentition  C/o pain in LT leg x 1 wk:  Feels a popping in upper thigh/groin Most noticeable when he has been sitting for a while then goes to stand up.  "Like a stiff feeling.  Hurts to put weigh on it."  Gets better after several steps.  He has hernia in the left groin.  Does not feel that it has increased in size.  It does not bother him and not painful.  On Saturday, his back on the right side started bothering him.  He feels it is muscle spasms.  He was doing a lot of repetitive lifting at work.  Denies any dysuria. Took some Ibuprofen and a muscle relaxant that he had left over  HTN:  Ran out of Norvasc and Spironolactone.  States it is hard to get back to pharmacy to get refills because he is working 2 jobs.  By the time he gets off the pharmacy is closed.  On previous visits I had grabbed 90-day supply of medications but patient states that the pharmacy has never given him a 90-day supply.  I told him that I will send it is a 90-day supply again and he needs to request it.  He admits that sometimes it is a financial strain for him to get his medicines. -Denies any chest pains, shortness of breath, headaches or dizziness.  Patient Active Problem List   Diagnosis Date Noted  . Inguinal hernia of left side without obstruction or gangrene 12/08/2016  . Tobacco use 12/08/2016  . Localized swelling, mass and lump, multiple sites 12/08/2016  . Subcutaneous nodules 07/09/2016  . Acute left-sided thoracic back pain 02/16/2016  . Left inguinal hernia 12/12/2015  . Poor dentition 05/27/2015  . Periodontitis 05/27/2015  . Pain in left wrist 05/27/2015  . HTN (hypertension) 05/12/2015     No current outpatient medications on file prior to visit.   No  current facility-administered medications on file prior to visit.     Allergies  Allergen Reactions  . Penicillins Swelling and Rash    Has patient had a PCN reaction causing immediate rash, facial/tongue/throat swelling, SOB or lightheadedness with hypotension: Yes/No:[id number] Yes Has patient had a PCN reaction causing severe rash involving mucus membranes or skin necrosis: Yes/No:[id number] No Has patient had a PCN reaction that required hospitalization Yes/No:[id number] Yes Has patient had a PCN reaction occurring within the last 10 years: Yes/No:[id number] No If all of the above answers are "NO", then may proceed with Cephalospori    Social History   Socioeconomic History  . Marital status: Single    Spouse name: Not on file  . Number of children: Not on file  . Years of education: Not on file  . Highest education level: Not on file  Occupational History  . Not on file  Social Needs  . Financial resource strain: Not on file  . Food insecurity:    Worry: Not on file    Inability: Not on file  . Transportation needs:    Medical: Not on file    Non-medical: Not on file  Tobacco Use  . Smoking status: Former Smoker    Packs/day: 1.00    Types: Cigars    Last attempt to quit:  05/10/2015    Years since quitting: 2.8  . Smokeless tobacco: Never Used  Substance and Sexual Activity  . Alcohol use: Yes    Comment: occ  . Drug use: No  . Sexual activity: Not on file  Lifestyle  . Physical activity:    Days per week: Not on file    Minutes per session: Not on file  . Stress: Not on file  Relationships  . Social connections:    Talks on phone: Not on file    Gets together: Not on file    Attends religious service: Not on file    Active member of club or organization: Not on file    Attends meetings of clubs or organizations: Not on file    Relationship status: Not on file  . Intimate partner violence:    Fear of current or ex partner: Not on file    Emotionally abused: Not  on file    Physically abused: Not on file    Forced sexual activity: Not on file  Other Topics Concern  . Not on file  Social History Narrative  . Not on file    Family History  Problem Relation Age of Onset  . Cancer Mother     Past Surgical History:  Procedure Laterality Date  . CHOLECYSTECTOMY      ROS: Review of Systems Negative except as above. PHYSICAL EXAM: BP (!) 180/137 (BP Location: Right Arm, Cuff Size: Large) Comment: recheck  Pulse 81   Temp 98.1 F (36.7 C) (Oral)   Resp 16   Ht 6' 2.5" (1.892 m)   Wt 221 lb (100.2 kg)   SpO2 98%   BMI 28.00 kg/m   Physical Exam  General appearance - alert, well appearing, and in no distress Mental status - normal mood, behavior, speech, dress, motor activity, and thought processes GU Male -CMA Sallyanne Havers present: Small firmness felt in the left inguinal region below the inguinal ligament. Musculoskeletal -left hip: No point tenderness on palpation over the trochanteric bursa.  Mild to moderate discomfort with rotation of the hip. Mild tenderness on palpation of thoracic paraspinal muscles on the right side.  ASSESSMENT AND PLAN: 1. Essential hypertension Not at goal due to noncompliance with medication.  Encourage patient to request a 90-day supply if he is able to afford it that way he is not having to come to the pharmacy every month for refills.  Follow-up with clinical pharmacist in 2 weeks for repeat blood pressure check. - spironolactone (ALDACTONE) 50 MG tablet; Take 1 tablet (50 mg total) by mouth daily.  Dispense: 90 tablet; Refill: 1 - carvedilol (COREG) 12.5 MG tablet; Take 1 tablet (12.5 mg total) by mouth 2 (two) times daily with a meal.  Dispense: 180 tablet; Refill: 2 - amLODipine (NORVASC) 10 MG tablet; Take 1 tablet (10 mg total) by mouth daily.  Dispense: 90 tablet; Refill: 1  2. Non-recurrent unilateral inguinal hernia without obstruction or gangrene This is not bothersome to him at this time.  Will  observe for now.  Should avoid heavy lifting  3. Pain of left hip joint Likely OA.  We will get an x-ray.  Limited supply of tramadol given. NNCSRS reviewed - traMADol (ULTRAM) 50 MG tablet; Take 1 tablet (50 mg total) by mouth every 6 (six) hours as needed.  Dispense: 20 tablet; Refill: 0 - DG HIP UNILAT WITH PELVIS 2-3 VIEWS LEFT; Future  4. Acute right-sided thoracic back pain Advised to try to avoid heavy lifting.  Pt requested note to be off work for several days.  Work excuse Quarry manager given. Limited supply of Flexeril given.  Advised that Flexeril and tramadol can cause drowsiness. - cyclobenzaprine (FLEXERIL) 5 MG tablet; Take 1 tablet (5 mg total) by mouth 2 (two) times daily as needed for muscle spasms.  Dispense: 30 tablet; Refill: 0 - traMADol (ULTRAM) 50 MG tablet; Take 1 tablet (50 mg total) by mouth every 6 (six) hours as needed.  Dispense: 20 tablet; Refill: 0    Patient was given the opportunity to ask questions.  Patient verbalized understanding of the plan and was able to repeat key elements of the plan.   Orders Placed This Encounter  Procedures  . DG HIP UNILAT WITH PELVIS 2-3 VIEWS LEFT     Requested Prescriptions   Signed Prescriptions Disp Refills  . spironolactone (ALDACTONE) 50 MG tablet 90 tablet 1    Sig: Take 1 tablet (50 mg total) by mouth daily.  . carvedilol (COREG) 12.5 MG tablet 180 tablet 2    Sig: Take 1 tablet (12.5 mg total) by mouth 2 (two) times daily with a meal.  . amLODipine (NORVASC) 10 MG tablet 90 tablet 1    Sig: Take 1 tablet (10 mg total) by mouth daily.  Marland Kitchen terbinafine (LAMISIL) 1 % cream 36 g 2    Sig: Apply to affected area twice daily  . cyclobenzaprine (FLEXERIL) 5 MG tablet 30 tablet 0    Sig: Take 1 tablet (5 mg total) by mouth 2 (two) times daily as needed for muscle spasms.  . traMADol (ULTRAM) 50 MG tablet 20 tablet 0    Sig: Take 1 tablet (50 mg total) by mouth every 6 (six) hours as needed.    Return in about 3 months  (around 06/24/2018).  Karle Plumber, MD, FACP

## 2018-03-25 NOTE — Patient Instructions (Addendum)
Give appointment with the clinical pharmacist in 2 weeks for blood pressure recheck.  Flexeril and Tramadol can cause drowsiness.  Do not take when you have to operate any machinery.  Try to avoid heavy lifting.

## 2018-03-26 MED FILL — AMLODIPINE BESYLATE 10 MG T: 10 | 30 days supply | Qty: 30 | Fill #0

## 2018-03-26 MED FILL — CARVEDILOL 12.5 MG TABLET: 12.5 | 30 days supply | Qty: 60 | Fill #0

## 2018-03-26 MED FILL — CYCLOBENZAPRINE 5 MG TABLET: 5 | 15 days supply | Qty: 30 | Fill #0

## 2018-03-26 MED FILL — SPIRONOLACTONE 50 MG TABLET: 50 | 30 days supply | Qty: 30 | Fill #0

## 2018-03-27 ENCOUNTER — Ambulatory Visit (HOSPITAL_COMMUNITY)
Admission: RE | Admit: 2018-03-27 | Discharge: 2018-03-27 | Disposition: A | Discharge status: Home / Self Care | Payer: Self-pay | Source: Ambulatory Visit | Attending: Internal Medicine | Admitting: Internal Medicine

## 2018-03-27 DIAGNOSIS — M25552 Pain in left hip: Secondary | ICD-10-CM | POA: Insufficient documentation

## 2018-04-03 ENCOUNTER — Telehealth: Payer: Self-pay

## 2018-04-03 NOTE — Telephone Encounter (Signed)
Contacted pt to go over xray results pt is aware and doesn't have any questions or concerns  

## 2018-04-08 ENCOUNTER — Ambulatory Visit: Payer: Self-pay | Attending: Family Medicine | Admitting: Pharmacist

## 2018-04-08 ENCOUNTER — Encounter: Payer: Self-pay | Admitting: Pharmacist

## 2018-04-08 ENCOUNTER — Encounter: Payer: Self-pay | Admitting: Internal Medicine

## 2018-04-08 VITALS — BP 166/109 | HR 79

## 2018-04-08 DIAGNOSIS — I10 Essential (primary) hypertension: Secondary | ICD-10-CM | POA: Insufficient documentation

## 2018-04-08 DIAGNOSIS — Z9114 Patient's other noncompliance with medication regimen: Secondary | ICD-10-CM | POA: Insufficient documentation

## 2018-04-08 DIAGNOSIS — F17210 Nicotine dependence, cigarettes, uncomplicated: Secondary | ICD-10-CM | POA: Insufficient documentation

## 2018-04-08 NOTE — Progress Notes (Signed)
   S:  PCP: Dr. Wynetta Emery    Patient arrives in good spirits. Presents to the clinic for hypertension management. Patient was referred by Dr. Wynetta Emery on 03/25/18. BP at that visit 180/137 - no changes were made to medications as patient reported non-compliance.  Patient reports adherence with medications. Denies chest pain, shortness of breath, HA, or blurred vision.  Current BP Medications include:  Amlodipine 10 mg, carvedilol 12.5 mg BID, spironolactone 50 mg daily  Antihypertensives tried in the past include: HCTZ (d/c d/t non-compliance), lisinopril (non-compliance)  Dietary habits include: does limit sodium; drinks caffeine throughout the day Exercise habits include: does not exercise outside of work Family / Social history: no pertinent positives; current smoker of 1 cigar a week, occasional alcohol use  Home BP readings: has not checked at home recently   O:  L arm after 5 minutes: 166/109, HR 79  Last 3 Office BP readings: BP Readings from Last 3 Encounters:  04/08/18 (!) 166/109  03/25/18 (!) 180/137  12/31/17 (!) 170/111   BMET    Component Value Date/Time   NA 140 04/08/2018 0945   K 4.3 04/08/2018 0945   CL 101 04/08/2018 0945   CO2 24 04/08/2018 0945   GLUCOSE 118 (H) 04/08/2018 0945   GLUCOSE 109 (H) 10/31/2016 0821   BUN 9 04/08/2018 0945   CREATININE 1.06 04/08/2018 0945   CREATININE 0.97 12/12/2015 1306   CALCIUM 9.6 04/08/2018 0945   GFRNONAA 86 04/08/2018 0945   GFRNONAA >89 12/12/2015 1306   GFRAA 99 04/08/2018 0945   GFRAA >89 12/12/2015 1306   Renal function: CrCl cannot be calculated (Unknown ideal weight.).  Clinical ASCVD: No  The ASCVD Risk score Mikey Bussing DC Jr., et al., 2013) failed to calculate for the following reasons:   The valid total cholesterol range is 130 to 320 mg/dL  A/P: Hypertension longstanding currently uncontrolled on current medications. BP Goal <130/80 mmHg. Patient is adherent with current medications. Pt not agreeable to  change therapy today. Discussed risks of uncontrolled HTN - pt recently restarted medications ~2 weeks ago. Will get labs today and see him again in two weeks.  -Continued current regimen.  -F/u labs ordered - lipid; CMP14 + GFR -Counseled on lifestyle modifications for blood pressure control including reduced dietary sodium, increased exercise, adequate sleep  Results reviewed and written information provided. Total time in face-to-face counseling 15 minutes.   F/U Clinic Visit in 2 weeks.    Patient seen with: Beckey Rutter, PharmD Candidate  [place] [employer]  Class [employer] 2022  Benard Halsted, PharmD, [place] [phone]

## 2018-04-08 NOTE — Patient Instructions (Signed)
Thank you for coming to see [place] today.   Blood pressure today is improving but still above goal.  Continue taking blood pressure medications as prescribed.   Limiting salt and caffeine, as well as exercising as able for at least 30 minutes for 5 days out of the week, can also help you lower your blood pressure.  Take your blood pressure at home if you are able. Please write down these numbers and bring them to your visits.  If you have any questions about medications, please call me [phone].  Lurena Joiner

## 2018-04-09 LAB — CMP14+EGFR
ALT: 17 IU/L (ref 0–44)
AST: 9 IU/L (ref 0–40)
Albumin/Globulin Ratio: 1.2 (ref 1.2–2.2)
Albumin: 4.1 g/dL (ref 4.0–5.0)
Alkaline Phosphatase: 94 IU/L (ref 39–117)
BUN/Creatinine Ratio: 8 — ABNORMAL LOW (ref 9–20)
BUN: 9 mg/dL (ref 6–24)
Bilirubin Total: 0.4 mg/dL (ref 0.0–1.2)
CHLORIDE: 101 mmol/L (ref 96–106)
CO2: 24 mmol/L (ref 20–29)
Calcium: 9.6 mg/dL (ref 8.7–10.2)
Creatinine, Ser: 1.06 mg/dL (ref 0.76–1.27)
GFR calc Af Amer: 99 mL/min/{1.73_m2} (ref >59)
GFR calc non Af Amer: 86 mL/min/{1.73_m2} (ref >59)
Globulin, Total: 3.3 g/dL (ref 1.5–4.5)
Glucose: 118 mg/dL — ABNORMAL HIGH (ref 65–99)
Potassium: 4.3 mmol/L (ref 3.5–5.2)
SODIUM: 140 mmol/L (ref 134–144)
Total Protein: 7.4 g/dL (ref 6.0–8.5)

## 2018-04-09 LAB — LIPID PANEL
Chol/HDL Ratio: 3.3 ratio (ref 0.0–5.0)
Cholesterol, Total: 115 mg/dL (ref 100–199)
HDL: 35 mg/dL — ABNORMAL LOW (ref >39)
LDL Calculated: 62 mg/dL (ref 0–99)
Triglycerides: 89 mg/dL (ref 0–149)
VLDL Cholesterol Cal: 18 mg/dL (ref 5–40)

## 2018-04-14 ENCOUNTER — Telehealth: Payer: Self-pay

## 2018-04-14 NOTE — Telephone Encounter (Signed)
Patient was called and a voicemail was left informing patient to return phone call for lab results. 

## 2018-04-14 NOTE — Telephone Encounter (Signed)
-----   Message from Charlott Rakes, MD sent at 04/09/2018  5:32 PM EST ----- Lab results are normal

## 2018-04-15 ENCOUNTER — Telehealth: Payer: Self-pay | Admitting: Internal Medicine

## 2018-04-15 NOTE — Telephone Encounter (Signed)
Patient called to get their lab results. Patient states he will be at work after 4 and can be reached at this number even after 4. Please follow up  [phone]

## 2018-04-16 NOTE — Telephone Encounter (Signed)
Patient returned phone call and was informed of lab results. 

## 2018-05-15 ENCOUNTER — Encounter: Payer: Self-pay | Admitting: Internal Medicine

## 2018-05-15 ENCOUNTER — Ambulatory Visit: Payer: Self-pay | Attending: Internal Medicine | Admitting: Internal Medicine

## 2018-05-15 VITALS — BP 162/123 | HR 70 | Temp 98.6°F | Resp 16 | Wt 218.8 lb

## 2018-05-15 DIAGNOSIS — M5431 Sciatica, right side: Secondary | ICD-10-CM

## 2018-05-15 DIAGNOSIS — I10 Essential (primary) hypertension: Secondary | ICD-10-CM

## 2018-05-15 MED ORDER — PREDNISONE 20 MG PO TABS: ORAL_TABLET | ORAL | 0 refills | Status: DC

## 2018-05-15 MED ORDER — CARVEDILOL 12.5 MG PO TABS: 18.7500 mg | ORAL_TABLET | Freq: Two times a day (BID) | ORAL | 6 refills | Status: DC

## 2018-05-15 MED ORDER — TRAMADOL HCL 50 MG PO TABS: 50.0000 mg | ORAL_TABLET | Freq: Three times a day (TID) | ORAL | 0 refills | Status: DC | PRN

## 2018-05-15 NOTE — Progress Notes (Signed)
Patient ID: [name], male    DOB: 1975/04/27  MRN: [id number]  CC: Back Pain   Subjective: [name] is a 43 y.o. male who presents for UC visit His concerns today include:  Hx ofpoorlyHTN, tob dep, poor dentition  Pt reports "I'm bother by this back stuff.  Feel like my whole body breaking down."  He woke up this morning with a flare of low back pain again mainly on the right side.  Pain radiates down the right leg to the level of the knee.  Some numbness and tingling in the lower back and down the back of the leg when he tries to put weight on the leg.  He works 2 jobs unloading trucks.  He can lift up to 100 pounds manually when unloading.  He also does a lot of pushing and pulling of pallets.  HTN:  Forgot to take meds yesterday morning.  Took last night.  Did not take meds as yet for the morning Checks BP 2 x a wk. Gives range DBP in 90s, he does not recall systolic. He tries to limit salt in the foods.  Patient Active Problem List   Diagnosis Date Noted  . Inguinal hernia of left side without obstruction or gangrene 12/08/2016  . Tobacco use 12/08/2016  . Localized swelling, mass and lump, multiple sites 12/08/2016  . Subcutaneous nodules 07/09/2016  . Acute left-sided thoracic back pain 02/16/2016  . Left inguinal hernia 12/12/2015  . Poor dentition 05/27/2015  . Periodontitis 05/27/2015  . Pain in left wrist 05/27/2015  . HTN (hypertension) 05/12/2015     Current Outpatient Medications on File Prior to Visit  Medication Sig Dispense Refill  . amLODipine (NORVASC) 10 MG tablet Take 1 tablet (10 mg total) by mouth daily. 90 tablet 1  . cyclobenzaprine (FLEXERIL) 5 MG tablet Take 1 tablet (5 mg total) by mouth 2 (two) times daily as needed for muscle spasms. 30 tablet 0  . spironolactone (ALDACTONE) 50 MG tablet Take 1 tablet (50 mg total) by mouth daily. 90 tablet 1  . terbinafine (LAMISIL) 1 % cream Apply to affected area twice daily 36 g 2   No current  facility-administered medications on file prior to visit.     Allergies  Allergen Reactions  . Penicillins Swelling and Rash    Has patient had a PCN reaction causing immediate rash, facial/tongue/throat swelling, SOB or lightheadedness with hypotension: Yes/No:[id number] Yes Has patient had a PCN reaction causing severe rash involving mucus membranes or skin necrosis: Yes/No:[id number] No Has patient had a PCN reaction that required hospitalization Yes/No:[id number] Yes Has patient had a PCN reaction occurring within the last 10 years: Yes/No:[id number] No If all of the above answers are "NO", then may proceed with Cephalospori    Social History   Socioeconomic History  . Marital status: Single    Spouse name: Not on file  . Number of children: Not on file  . Years of education: Not on file  . Highest education level: Not on file  Occupational History  . Not on file  Social Needs  . Financial resource strain: Not on file  . Food insecurity:    Worry: Not on file    Inability: Not on file  . Transportation needs:    Medical: Not on file    Non-medical: Not on file  Tobacco Use  . Smoking status: Former Smoker    Packs/day: 1.00    Types: Cigars    Last attempt to quit: 05/10/2015  Years since quitting: 3.0  . Smokeless tobacco: Never Used  Substance and Sexual Activity  . Alcohol use: Yes    Comment: occ  . Drug use: No  . Sexual activity: Not on file  Lifestyle  . Physical activity:    Days per week: Not on file    Minutes per session: Not on file  . Stress: Not on file  Relationships  . Social connections:    Talks on phone: Not on file    Gets together: Not on file    Attends religious service: Not on file    Active member of club or organization: Not on file    Attends meetings of clubs or organizations: Not on file    Relationship status: Not on file  . Intimate partner violence:    Fear of current or ex partner: Not on file    Emotionally abused: Not on file     Physically abused: Not on file    Forced sexual activity: Not on file  Other Topics Concern  . Not on file  Social History Narrative  . Not on file    Family History  Problem Relation Age of Onset  . Cancer Mother     Past Surgical History:  Procedure Laterality Date  . CHOLECYSTECTOMY      ROS: Review of Systems Negative except as stated above  PHYSICAL EXAM: BP (!) 162/123   Pulse 70   Temp 98.6 F (37 C) (Oral)   Resp 16   Wt 218 lb 12.8 oz (99.2 kg)   SpO2 99%   BMI 27.72 kg/m   Physical Exam  General appearance - alert, well appearing, and in no distress Mental status - normal mood, behavior, speech, dress, motor activity, and thought processes Musculoskeletal -no tenderness on palpation of the lumbar spine or surrounding paraspinal muscles.  Straight leg raise on the right reproduces pain in the lower back.  Power in both lower extremities 5/5 bilaterally.  Gross sensation intact.  CMP Latest Ref Rng & Units 04/08/2018 08/14/2017 10/31/2016  Glucose 65 - 99 mg/dL 118(H) 90 109(H)  BUN 6 - 24 mg/dL 9 11 10   Creatinine 0.76 - 1.27 mg/dL 1.06 1.07 0.93  Sodium 134 - 144 mmol/L 140 142 137  Potassium 3.5 - 5.2 mmol/L 4.3 4.4 3.6  Chloride 96 - 106 mmol/L 101 101 106  CO2 20 - 29 mmol/L 24 27 24   Calcium 8.7 - 10.2 mg/dL 9.6 9.9 8.9  Total Protein 6.0 - 8.5 g/dL 7.4 7.9 -  Total Bilirubin 0.0 - 1.2 mg/dL 0.4 0.4 -  Alkaline Phos 39 - 117 IU/L 94 91 -  AST 0 - 40 IU/L 9 19 -  ALT 0 - 44 IU/L 17 23 -   Lipid Panel     Component Value Date/Time   CHOL 115 04/08/2018 0945   TRIG 89 04/08/2018 0945   HDL 35 (L) 04/08/2018 0945   CHOLHDL 3.3 04/08/2018 0945   LDLCALC 62 04/08/2018 0945    CBC    Component Value Date/Time   WBC 8.8 08/14/2017 1630   WBC 7.3 10/31/2016 0821   RBC 5.54 08/14/2017 1630   RBC 5.02 10/31/2016 0821   HGB 16.5 08/14/2017 1630   HCT 46.7 08/14/2017 1630   PLT 289 08/14/2017 1630   MCV 84 08/14/2017 1630   MCH 29.8 08/14/2017  1630   MCH 30.3 10/31/2016 0821   MCHC 35.3 08/14/2017 1630   MCHC 36.3 (H) 10/31/2016 0821   RDW  15.0 08/14/2017 1630   LYMPHSABS 3.9 (H) 08/14/2017 1630   MONOABS 0.5 10/19/2015 2151   EOSABS 0.2 08/14/2017 1630   BASOSABS 0.1 08/14/2017 1630    ASSESSMENT AND PLAN:  1. Essential hypertension Not at goal.  Increase carvedilol to 18.75 mg twice a day.  Continue to limit salt in the foods.  Continue to monitor blood pressure. - carvedilol (COREG) 12.5 MG tablet; Take 1.5 tablets (18.75 mg total) by mouth 2 (two) times daily with a meal.  Dispense: 90 tablet; Refill: 6  2. Sciatica of right side Likely due to repetitive lifting.  Advise keeping him out of work for about a week.  However patient states that he cannot afford to be out that long and would prefer to go back to work tomorrow.  I advised light duty with a lifting restriction for a few weeks but patient states he would have to look into it with his employer. Have given some tramadol to use as needed along with prednisone.  He still has some Flexeril at home.  Advise use of local heat to the lower back as needed. - traMADol (ULTRAM) 50 MG tablet; Take 1 tablet (50 mg total) by mouth every 8 (eight) hours as needed.  Dispense: 20 tablet; Refill: 0 - predniSONE (DELTASONE) 20 MG tablet; 1.5 tab daily Po x 2 days then 1 tab daily x 3 days  Dispense: 6 tablet; Refill: 0   Patient was given the opportunity to ask questions.  Patient verbalized understanding of the plan and was able to repeat key elements of the plan.   No orders of the defined types were placed in this encounter.    Requested Prescriptions   Signed Prescriptions Disp Refills  . traMADol (ULTRAM) 50 MG tablet 20 tablet 0    Sig: Take 1 tablet (50 mg total) by mouth every 8 (eight) hours as needed.  . predniSONE (DELTASONE) 20 MG tablet 6 tablet 0    Sig: 1.5 tab daily Po x 2 days then 1 tab daily x 3 days  . carvedilol (COREG) 12.5 MG tablet 90 tablet 6     Sig: Take 1.5 tablets (18.75 mg total) by mouth 2 (two) times daily with a meal.    No follow-ups on file.  Karle Plumber, MD, FACP

## 2018-05-27 MED FILL — AMLODIPINE BESYLATE 10 MG T: 10 | 30 days supply | Qty: 30 | Fill #1

## 2018-05-27 MED FILL — SPIRONOLACTONE 50 MG TABLET: 50 | 30 days supply | Qty: 30 | Fill #1

## 2018-06-06 ENCOUNTER — Ambulatory Visit: Payer: Self-pay | Attending: Internal Medicine | Admitting: Pharmacist

## 2018-06-06 ENCOUNTER — Other Ambulatory Visit: Payer: Self-pay

## 2018-06-06 DIAGNOSIS — I10 Essential (primary) hypertension: Secondary | ICD-10-CM

## 2018-06-06 NOTE — Progress Notes (Signed)
   S:    Patient in good spirits. Service for HTN management was provided via telemedicine. Patient is currently in his home; I am corresponding from my office in clinic. After name and DOB verification, pt is consensual to proceed with telephone visit.  He was last seen by Dr. Wynetta Emery 05/15/18. She increased his carvedilol dose.   Patient denies adherence with medications. Denies chest pain, dyspnea, or blurred vision. Endorses HA for past several days.  Current BP Medications include:  Amlodipine 10 mg daily, spironolactone 50 mg daily, carvedilol 18.75 mg BID (pt still taking 12.5 mg BID)  Dietary habits include: limits salt but does eat fast food frequently; drinks 2-3 cans of soda a day Exercise habits include: 20 minutes a day, 2x a week Family / Social history: no pertinent positives; current smoker of 1 cigar a week, occasional alcohol use  Home BP readings: has not checked in two weeks  O:  Vitals not taken as this is a telemedicine visit.   Last 3 Office BP readings: BP Readings from Last 3 Encounters:  05/15/18 (!) 162/123  04/08/18 (!) 166/109  03/25/18 (!) 180/137   BMET    Component Value Date/Time   NA 140 04/08/2018 0945   K 4.3 04/08/2018 0945   CL 101 04/08/2018 0945   CO2 24 04/08/2018 0945   GLUCOSE 118 (H) 04/08/2018 0945   GLUCOSE 109 (H) 10/31/2016 0821   BUN 9 04/08/2018 0945   CREATININE 1.06 04/08/2018 0945   CREATININE 0.97 12/12/2015 1306   CALCIUM 9.6 04/08/2018 0945   GFRNONAA 86 04/08/2018 0945   GFRNONAA >89 12/12/2015 1306   GFRAA 99 04/08/2018 0945   GFRAA >89 12/12/2015 1306   Renal function: CrCl cannot be calculated (Patient's most recent lab result is older than the maximum 21 days allowed.).  Clinical ASCVD: No  The ASCVD Risk score Mikey Bussing DC Jr., et al., 2013) failed to calculate for the following reasons:   The valid total cholesterol range is 130 to 320 mg/dL  A/P: Hypertension longstanding currently uncontrolled on current  medications. BP Goal <130/80 mmHg. He has not been monitoring his pressures at home. Patient is adherent with current medications. Advised him to take 1 and 1/2 tablets of carvedilol (18.75 mg) BID as instructed at last PCP visit. He is amenable to doing so. Pt amenable to continuing amlodipine and spironolactone.  -Continued current regimen. -Advised to take carvedilol as prescribed.  -Counseled on lifestyle modifications for blood pressure control including reduced dietary sodium, increased exercise, adequate sleep  Results reviewed and written information provided. Total time in face-to-face counseling 15 minutes.   F/U Clinic Visit in 1 month.    Benard Halsted, PharmD, [place] [phone]

## 2018-06-27 ENCOUNTER — Ambulatory Visit: Payer: Self-pay | Admitting: Internal Medicine

## 2018-07-08 ENCOUNTER — Telehealth: Payer: Self-pay | Admitting: Internal Medicine

## 2018-07-08 ENCOUNTER — Other Ambulatory Visit: Payer: Self-pay

## 2018-07-08 ENCOUNTER — Ambulatory Visit: Payer: Self-pay | Attending: Internal Medicine | Admitting: Internal Medicine

## 2018-07-08 DIAGNOSIS — I10 Essential (primary) hypertension: Secondary | ICD-10-CM

## 2018-07-08 MED ORDER — AMLODIPINE BESYLATE 10 MG PO TABS: 10.0000 mg | ORAL_TABLET | Freq: Every day | ORAL | 1 refills | Status: DC

## 2018-07-08 MED ORDER — SPIRONOLACTONE 50 MG PO TABS: 50.0000 mg | ORAL_TABLET | Freq: Every day | ORAL | 1 refills | Status: DC

## 2018-07-08 MED ORDER — CARVEDILOL 12.5 MG PO TABS: 18.7500 mg | ORAL_TABLET | Freq: Two times a day (BID) | ORAL | 1 refills | Status: DC

## 2018-07-08 MED FILL — SPIRONOLACTONE 50 MG TABLET: 50 | 30 days supply | Qty: 30 | Fill #2

## 2018-07-08 MED FILL — AMLODIPINE BESYLATE 10 MG T: 10 | 30 days supply | Qty: 30 | Fill #2

## 2018-07-08 MED FILL — CARVEDILOL 12.5 MG TABLET: 12.5 | 30 days supply | Qty: 90 | Fill #0

## 2018-07-08 NOTE — Telephone Encounter (Signed)
After speaking to patient, he corrects the message that was sent to writer. He states he requested an appointment. His employer stated that he would need a note prior to returning to work.  Scheduled an appointment for patient.

## 2018-07-08 NOTE — Telephone Encounter (Signed)
Attempt to call patient about appointment.  Patient not available per male figure.  Will give patient the message to return call.

## 2018-07-08 NOTE — Telephone Encounter (Signed)
New Message   Pt states he is having elevated blood pressures. Please f/u

## 2018-07-08 NOTE — Progress Notes (Signed)
Virtual Visit via Telephone Note Due to current restrictions/limitations of in-office visits due to the COVID-19 pandemic, this scheduled clinical appointment was converted to a telehealth visit  I connected with [name] on 07/08/18 at 2:56 p.m by telephone from my office and verified that I am speaking with the correct person using two identifiers. Pt is at home.     I discussed the limitations, risks, security and privacy concerns of performing an evaluation and management service by telephone and the availability of in person appointments. I also discussed with the patient that there may be a patient responsible charge related to this service. The patient expressed understanding and agreed to proceed.   History of Present Illness: Hx ofpoorlyHTN, tob dep, poor dentition  Patient requested the same day tele-visit to discuss blood pressure and to request a work excuse.  Patient states that he missed work 2 days ago because he was not feeling well and blood pressure was 178/121.  He reportedly took all of his blood pressure medications and reports compliance with medicines.  However on review of pickup dates from the pharmacy, it was shown that he last picked up carvedilol on 6 January for 60 tablets and amlodipine and spironolactone were last picked up on 05/27/2018 for 1 month supply.  However patient insists that he still had some more of the carvedilol at home which was why he had not picked up his refill as yet.  He admits that he some days he he skips taking his medicines.   Use to work 2 jobs and because of this, he had difficulty getting to the pharmacy in time to pick up his medications.  However he states that he now is working only 1 job so that should no longer present an issue for him to be able to pick up his medications on time.    -He does have blood pressure device at home.  Last time he took his blood pressure was 2 days ago when he stayed home from work.   Observations/Objective: No direct observation done as this was a telephone encounter  Assessment and Plan: 1. Essential hypertension Uncontrolled.  I suspect this is due to the fact that patient has not as compliant as he should be with taking his medications.  Just looking at his fill dates at the pharmacy suggest this.  I have given patient this information and stressed the importance of taking his medications every day as prescribed.  Again I have encouraged him to get 52-month supply at a time if he is able to afford to do so. Work excuse written for work - amLODipine (NORVASC) 10 MG tablet; Take 1 tablet (10 mg total) by mouth daily.  Dispense: 90 tablet; Refill: 1 - carvedilol (COREG) 12.5 MG tablet; Take 1.5 tablets (18.75 mg total) by mouth 2 (two) times daily with a meal.  Dispense: 270 tablet; Refill: 1 - spironolactone (ALDACTONE) 50 MG tablet; Take 1 tablet (50 mg total) by mouth daily.  Dispense: 90 tablet; Refill: 1   Follow Up Instructions: Keep follow-up appointment as previously scheduled   I discussed the assessment and treatment plan with the patient. The patient was provided an opportunity to ask questions and all were answered. The patient agreed with the plan and demonstrated an understanding of the instructions.   The patient was advised to call back or seek an in-person evaluation if the symptoms worsen or if the condition fails to improve as anticipated.  I provided 11 minutes of non-face-to-face  time during this encounter.   Karle Plumber, MD

## 2018-07-08 NOTE — Progress Notes (Signed)
Pt states his bp on Sunday was 178/121.   Pt states he ran out of his old carvedilol Sunday.

## 2018-07-08 NOTE — Telephone Encounter (Signed)
Patient had same day appointment with PCP today.

## 2018-07-09 ENCOUNTER — Ambulatory Visit: Payer: Self-pay | Admitting: Pharmacist

## 2018-07-09 ENCOUNTER — Ambulatory Visit: Payer: Self-pay

## 2018-07-21 ENCOUNTER — Telehealth: Payer: Self-pay | Admitting: Internal Medicine

## 2018-07-21 NOTE — Telephone Encounter (Signed)
Returned pt call pt was outside left message with a man informing him to have pt give me a call back

## 2018-07-21 NOTE — Telephone Encounter (Signed)
Pt called in wanting to speak with CMA did not disclose reason please follow up

## 2018-12-04 ENCOUNTER — Ambulatory Visit: Payer: Self-pay | Admitting: Family Medicine

## 2019-01-29 ENCOUNTER — Encounter: Payer: Self-pay | Admitting: Internal Medicine

## 2019-01-29 ENCOUNTER — Ambulatory Visit: Payer: Self-pay | Attending: Internal Medicine | Admitting: Internal Medicine

## 2019-01-29 ENCOUNTER — Other Ambulatory Visit: Payer: Self-pay

## 2019-01-29 VITALS — BP 172/128 | HR 74 | Temp 97.9°F | Resp 16 | Wt 211.8 lb

## 2019-01-29 DIAGNOSIS — Z2821 Immunization not carried out because of patient refusal: Secondary | ICD-10-CM

## 2019-01-29 DIAGNOSIS — I10 Essential (primary) hypertension: Secondary | ICD-10-CM

## 2019-01-29 DIAGNOSIS — L84 Corns and callosities: Secondary | ICD-10-CM

## 2019-01-29 DIAGNOSIS — R768 Other specified abnormal immunological findings in serum: Secondary | ICD-10-CM

## 2019-01-29 DIAGNOSIS — M67972 Unspecified disorder of synovium and tendon, left ankle and foot: Secondary | ICD-10-CM

## 2019-01-29 DIAGNOSIS — R634 Abnormal weight loss: Secondary | ICD-10-CM

## 2019-01-29 MED ORDER — CARVEDILOL 12.5 MG PO TABS: 18.7500 mg | ORAL_TABLET | Freq: Two times a day (BID) | ORAL | 1 refills | Status: DC

## 2019-01-29 MED ORDER — SPIRONOLACTONE 50 MG PO TABS: 50.0000 mg | ORAL_TABLET | Freq: Every day | ORAL | 1 refills | Status: DC

## 2019-01-29 MED ORDER — AMLODIPINE BESYLATE 10 MG PO TABS: 10.0000 mg | ORAL_TABLET | Freq: Every day | ORAL | 1 refills | Status: DC

## 2019-01-29 MED FILL — CARVEDILOL 12.5 MG TABLET: 12.5 | 30 days supply | Qty: 90 | Fill #0

## 2019-01-29 MED FILL — AMLODIPINE BESYLATE 10 MG T: 10 | 30 days supply | Qty: 30 | Fill #0

## 2019-01-29 MED FILL — SPIRONOLACTONE 50 MG TABLET: 50 | 30 days supply | Qty: 30 | Fill #0

## 2019-01-29 NOTE — Progress Notes (Signed)
Patient ID: [name], male    DOB: [birth year]  MRN: [id number]  CC: Arthritis   Subjective: [name] is a 43 y.o. male who presents for chronic ds management His concerns today include:  Hx ofpoorlyHTN, tob dep, poor dentition  Patient presents today complaining of overall feeling stressed about his health and wondering whether he needs to apply for disability.  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  Last night he felt like he pulled his right hamstring when he suddenly moved a certain way.  Luckily some of his coworkers were able to help him out and it is better now.  Earlier in the week he felt like his left arm quit working on him while at work.  Still continues to have some intermittent back issues.  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  He feels that his arthritis overall may be getting worse.  He thinks he has rheumatoid arthritis as rheumatoid factor was slightly elevated on 11/2016.  At that time he was complaining of a lot of body pain that was worse in the mornings however did not involve the typical joints usually associated with rheumatoid arthritis.  He does not have any chronic pain or swelling in the hands or wrists or the ankle joints.  the plan was to try to get him to a rheumatologist but patient never applied for the orange card/cone discount.  He feels that stress plays a role in keeping his blood pressure elevated whether he takes his medication or not.  He has been out of his blood pressure medications for a few months except clonidine because he did not want to come to any medical facility during the Covid pandemic.  Main concern today is of pain in the left ankle involving the Achilles tendon which has been ongoing since 2003.  Patient states he was hospitalized at that time for infection in the body and develop a large blister around the lateral and posterior aspect of the left ankle.  He received wound care for several months but was left with  a scab over the lower aspect of the Achilles tendon that has persisted.  Sometimes the scab would fall off when he takes a bath.  He also has callus on the sole of the foot.  He is noted to have some weight loss today.  Patient states the weight loss is intentional.  He has cut back on drinking sodas and eating late at night.  His current job now is more physical.  He has set a goal to get down to 205 pounds.  Reports having had an HIV test done in June of this year through the health department that was negative.  Patient Active Problem List   Diagnosis Date Noted  . Inguinal hernia of left side without obstruction or gangrene 12/08/2016  . Tobacco use 12/08/2016  . Localized swelling, mass and lump, multiple sites 12/08/2016  . Subcutaneous nodules 07/09/2016  . Acute left-sided thoracic back pain 02/16/2016  . Left inguinal hernia 12/12/2015  . Poor dentition 05/27/2015  . Periodontitis 05/27/2015  . Pain in left wrist 05/27/2015  . HTN (hypertension) 05/12/2015     Current Outpatient Medications on File Prior to Visit  Medication Sig Dispense Refill  . amLODipine (NORVASC) 10 MG tablet Take 1 tablet (10 mg total) by mouth daily. (Patient not taking: Reported on 01/29/2019) 90 tablet 1  . carvedilol (COREG) 12.5 MG tablet Take 1.5 tablets (18.75 mg total) by mouth 2 (two)  times daily with a meal. (Patient not taking: Reported on 01/29/2019) 270 tablet 1  . cyclobenzaprine (FLEXERIL) 5 MG tablet Take 1 tablet (5 mg total) by mouth 2 (two) times daily as needed for muscle spasms. (Patient not taking: Reported on 01/29/2019) 30 tablet 0  . spironolactone (ALDACTONE) 50 MG tablet Take 1 tablet (50 mg total) by mouth daily. (Patient not taking: Reported on 01/29/2019) 90 tablet 1  . terbinafine (LAMISIL) 1 % cream Apply to affected area twice daily (Patient not taking: Reported on 01/29/2019) 36 g 2  . traMADol (ULTRAM) 50 MG tablet Take 1 tablet (50 mg total) by mouth every 8 (eight) hours as  needed. (Patient not taking: Reported on 01/29/2019) 20 tablet 0   No current facility-administered medications on file prior to visit.     Allergies  Allergen Reactions  . Penicillins Swelling and Rash    Has patient had a PCN reaction causing immediate rash, facial/tongue/throat swelling, SOB or lightheadedness with hypotension: Yes/No:[id number] Yes Has patient had a PCN reaction causing severe rash involving mucus membranes or skin necrosis: Yes/No:[id number] No Has patient had a PCN reaction that required hospitalization Yes/No:[id number] Yes Has patient had a PCN reaction occurring within the last 10 years: Yes/No:[id number] No If all of the above answers are "NO", then may proceed with Cephalospori    Social History   Socioeconomic History  . Marital status: Single    Spouse name: Not on file  . Number of children: Not on file  . Years of education: Not on file  . Highest education level: Not on file  Occupational History  . Not on file  Social Needs  . Financial resource strain: Not on file  . Food insecurity    Worry: Not on file    Inability: Not on file  . Transportation needs    Medical: Not on file    Non-medical: Not on file  Tobacco Use  . Smoking status: Former Smoker    Packs/day: 1.00    Types: Cigars    Quit date: 05/10/2015    Years since quitting: 3.7  . Smokeless tobacco: Never Used  Substance and Sexual Activity  . Alcohol use: Yes    Comment: occ  . Drug use: No  . Sexual activity: Not on file  Lifestyle  . Physical activity    Days per week: Not on file    Minutes per session: Not on file  . Stress: Not on file  Relationships  . Social Herbalist on phone: Not on file    Gets together: Not on file    Attends religious service: Not on file    Active member of club or organization: Not on file    Attends meetings of clubs or organizations: Not on file    Relationship status: Not on file  . Intimate partner violence    Fear of current  or ex partner: Not on file    Emotionally abused: Not on file    Physically abused: Not on file    Forced sexual activity: Not on file  Other Topics Concern  . Not on file  Social History Narrative  . Not on file    Family History  Problem Relation Age of Onset  . Cancer Mother     Past Surgical History:  Procedure Laterality Date  . CHOLECYSTECTOMY      ROS: Review of Systems Negative except as stated above  PHYSICAL EXAM: BP (!) 172/128   Pulse 74  Temp 97.9 F (36.6 C) (Oral)   Resp 16   Wt 211 lb 12.8 oz (96.1 kg)   SpO2 98%   BMI 26.83 kg/m    Wt Readings from Last 3 Encounters:  01/29/19 211 lb 12.8 oz (96.1 kg)  05/15/18 218 lb 12.8 oz (99.2 kg)  03/25/18 221 lb (100.2 kg)    Physical Exam General appearance - alert, well appearing, middle-aged African-American male with noted weight loss since I last saw him and in no distress Mental status - normal mood, behavior, speech, dress, motor activity, and thought processes Neck - supple, no significant adenopathy Chest - clear to auscultation, no wheezes, rales or rhonchi, symmetric air entry Heart - normal rate, regular rhythm, normal S1, S2, no murmurs, rubs, clicks or gallops Neurologic: Grip 5/5 bilaterally.  Power in both upper and lower extreme extremities 5/5 bilaterally. Musculoskeletal -no signs of active inflammation of the wrists other joints of the hands.  He has good range of motion of these joints. Left ankle: He has some scarring skin on the medial and lateral aspect of the ankle.  He has a hard protruding callus-like not over the lower Achilles tendon.  It is slightly tender to touch.  No drainage noted.  Photo was taken and is shown below.  He has about a 1.5 cm callus on the ball of the left foot with a very hard corn-like lesion at the center.  He also has a callus on the medial aspect of the left big toe Extremities -no lower extremity edema  Keratotic lesion over posterior heel LT foot  CMP  Latest Ref Rng & Units 04/08/2018 08/14/2017 10/31/2016  Glucose 65 - 99 mg/dL 118(H) 90 109(H)  BUN 6 - 24 mg/dL 9 11 10   Creatinine 0.76 - 1.27 mg/dL 1.06 1.07 0.93  Sodium 134 - 144 mmol/L 140 142 137  Potassium 3.5 - 5.2 mmol/L 4.3 4.4 3.6  Chloride 96 - 106 mmol/L 101 101 106  CO2 20 - 29 mmol/L 24 27 24   Calcium 8.7 - 10.2 mg/dL 9.6 9.9 8.9  Total Protein 6.0 - 8.5 g/dL 7.4 7.9 -  Total Bilirubin 0.0 - 1.2 mg/dL 0.4 0.4 -  Alkaline Phos 39 - 117 IU/L 94 91 -  AST 0 - 40 IU/L 9 19 -  ALT 0 - 44 IU/L 17 23 -   Lipid Panel     Component Value Date/Time   CHOL 115 04/08/2018 0945   TRIG 89 04/08/2018 0945   HDL 35 (L) 04/08/2018 0945   CHOLHDL 3.3 04/08/2018 0945   LDLCALC 62 04/08/2018 0945    CBC    Component Value Date/Time   WBC 8.8 08/14/2017 1630   WBC 7.3 10/31/2016 0821   RBC 5.54 08/14/2017 1630   RBC 5.02 10/31/2016 0821   HGB 16.5 08/14/2017 1630   HCT 46.7 08/14/2017 1630   PLT 289 08/14/2017 1630   MCV 84 08/14/2017 1630   MCH 29.8 08/14/2017 1630   MCH 30.3 10/31/2016 0821   MCHC 35.3 08/14/2017 1630   MCHC 36.3 (H) 10/31/2016 0821   RDW 15.0 08/14/2017 1630   LYMPHSABS 3.9 (H) 08/14/2017 1630   MONOABS 0.5 10/19/2015 2151   EOSABS 0.2 08/14/2017 1630   BASOSABS 0.1 08/14/2017 1630    ASSESSMENT AND PLAN: 1. Essential hypertension Stressed the importance of compliance with blood pressure medications.  Discussed risks of uncontrolled blood pressure.  He plans to pick up his medications today.  I also informed him that pharmacy  can mail medications to him once he provide them with a form of payment over the phone - amLODipine (NORVASC) 10 MG tablet; Take 1 tablet (10 mg total) by mouth daily.  Dispense: 90 tablet; Refill: 1 - carvedilol (COREG) 12.5 MG tablet; Take 1.5 tablets (18.75 mg total) by mouth 2 (two) times daily with a meal.  Dispense: 270 tablet; Refill: 1 - spironolactone (ALDACTONE) 50 MG tablet; Take 1 tablet (50 mg total) by mouth daily.   Dispense: 90 tablet; Refill: 1 - CBC - Comprehensive metabolic panel  2. Disorder of left Achilles tendon 3. Pre-ulcerative corn or callous Needs to be seen by podiatry.  Encouraged him again to apply for the orange card and the cone discount card.  I will bring him back in about 6 weeks to see whether he has been approved then we can submit the referral  4. Weight loss Patient states this is intentional weight loss.  However I will check still check a TSH level - TSH  5. Rheumatoid factor positive Doubt RA as does not have involvement of typical jts, other symptoms and RA was only slightly elev - Rheumatoid factor - CYCLIC CITRUL PEPTIDE ANTIBODY, IGG/IGA  6. Influenza vaccination declined      Patient was given the opportunity to ask questions.  Patient verbalized understanding of the plan and was able to repeat key elements of the plan.   No orders of the defined types were placed in this encounter.    Requested Prescriptions    No prescriptions requested or ordered in this encounter    No follow-ups on file.  Karle Plumber, MD, FACP

## 2019-01-29 NOTE — Progress Notes (Signed)
Pt states his arthritis is getting worse  Pt states last night while at work he felt like he pulled a hamstring and his leg was hurting all night  Pt states he is out of bp medicine   Pt states he took 1/2 of clonidine last night and this morning

## 2019-01-31 LAB — CBC
Hematocrit: 50.5 % (ref 37.5–51.0)
Hemoglobin: 17.9 g/dL — ABNORMAL HIGH (ref 13.0–17.7)
MCH: 29.9 pg (ref 26.6–33.0)
MCHC: 35.4 g/dL (ref 31.5–35.7)
MCV: 84 fL (ref 79–97)
Platelets: 343 10*3/uL (ref 150–450)
RBC: 5.99 x10E6/uL — ABNORMAL HIGH (ref 4.14–5.80)
RDW: 13.1 % (ref 11.6–15.4)
WBC: 6.3 10*3/uL (ref 3.4–10.8)

## 2019-01-31 LAB — COMPREHENSIVE METABOLIC PANEL
ALT: 17 IU/L (ref 0–44)
AST: 16 IU/L (ref 0–40)
Albumin/Globulin Ratio: 1.2 (ref 1.2–2.2)
Albumin: 4.3 g/dL (ref 4.0–5.0)
Alkaline Phosphatase: 116 IU/L (ref 39–117)
BUN/Creatinine Ratio: 9 (ref 9–20)
BUN: 10 mg/dL (ref 6–24)
Bilirubin Total: 0.7 mg/dL (ref 0.0–1.2)
CO2: 25 mmol/L (ref 20–29)
Calcium: 9.6 mg/dL (ref 8.7–10.2)
Chloride: 100 mmol/L (ref 96–106)
Creatinine, Ser: 1.13 mg/dL (ref 0.76–1.27)
GFR calc Af Amer: 91 mL/min/{1.73_m2} (ref >59)
GFR calc non Af Amer: 79 mL/min/{1.73_m2} (ref >59)
Globulin, Total: 3.7 g/dL (ref 1.5–4.5)
Glucose: 90 mg/dL (ref 65–99)
Potassium: 4.4 mmol/L (ref 3.5–5.2)
Sodium: 140 mmol/L (ref 134–144)
Total Protein: 8 g/dL (ref 6.0–8.5)

## 2019-01-31 LAB — TSH: TSH: 0.716 u[IU]/mL (ref 0.450–4.500)

## 2019-01-31 LAB — RHEUMATOID FACTOR: Rheumatoid fact SerPl-aCnc: 19.6 IU/mL — ABNORMAL HIGH (ref 0.0–13.9)

## 2019-01-31 LAB — CYCLIC CITRUL PEPTIDE ANTIBODY, IGG/IGA: Cyclic Citrullin Peptide Ab: 7 units (ref 0–19)

## 2019-02-01 DIAGNOSIS — L84 Corns and callosities: Secondary | ICD-10-CM | POA: Insufficient documentation

## 2019-02-01 DIAGNOSIS — M67972 Unspecified disorder of synovium and tendon, left ankle and foot: Secondary | ICD-10-CM | POA: Insufficient documentation

## 2019-02-03 ENCOUNTER — Telehealth: Payer: Self-pay

## 2019-02-03 NOTE — Telephone Encounter (Signed)
Contacted pt to go over lab results pt was not at home will try pt again another day

## 2019-02-04 ENCOUNTER — Telehealth: Payer: Self-pay | Admitting: Internal Medicine

## 2019-02-04 NOTE — Telephone Encounter (Signed)
Patient called for labs  °

## 2019-02-04 NOTE — Telephone Encounter (Signed)
Patient called back for his lab results.  Pt. Was informed on lab results and PCP advising.  Pt. Stated he still have joint pain and aches that affect him when he is working.  Pt. Would like to know if there is anything that can help him or recommendation.  Pt. Was informed to apply for the CAFA.

## 2019-02-06 MED ORDER — DICLOFENAC SODIUM 1 % EX GEL: 2.0000 g | Freq: Four times a day (QID) | CUTANEOUS | 2 refills | Status: DC

## 2019-02-06 MED ORDER — ACETAMINOPHEN ER 650 MG PO TBCR: 650.0000 mg | EXTENDED_RELEASE_TABLET | Freq: Three times a day (TID) | ORAL | 1 refills | Status: DC | PRN

## 2019-02-09 MED FILL — DICLOFENAC SODIUM 1% GEL: 1 | 13 days supply | Qty: 100 | Fill #0

## 2019-02-09 NOTE — Telephone Encounter (Signed)
Contacted pt to go over Dr. Wynetta Emery message pt was not home will try pt again another time

## 2019-02-16 MED FILL — ?SPIRONOLACTON 50MG TABLET: 50 | 30 days supply | Qty: 30 | Fill #0

## 2019-02-16 MED FILL — ?CARVEDILOL 12.5 MG TABLET: 12.5 | 30 days supply | Qty: 90 | Fill #0

## 2019-02-16 MED FILL — ?AMLODIPINE BESYLATE 10 MG: 10 | 30 days supply | Qty: 30 | Fill #0

## 2019-03-09 ENCOUNTER — Telehealth: Payer: Self-pay

## 2019-03-09 NOTE — Telephone Encounter (Signed)
Pt states he had another episode of Vasovagal near syncope on Christmas. Pt states he was very fatigue, dizzy, sweating and no strength. Pt is requesting a call back from pcp

## 2019-03-10 NOTE — Telephone Encounter (Signed)
Pt has an appointment schedule with pcp on 1/8 looked for available appointments this week and there is none. Contacted pt and left a detailed vm informing pt of Dr. Wynetta Emery response and made pt aware that he has an appointment set for 1/8 but he has been placed on the cancellation list and if he has any questions or concerns to give a call

## 2019-03-20 ENCOUNTER — Other Ambulatory Visit: Payer: Self-pay

## 2019-03-20 ENCOUNTER — Encounter: Payer: Self-pay | Admitting: Internal Medicine

## 2019-03-20 ENCOUNTER — Ambulatory Visit: Payer: Self-pay | Attending: Internal Medicine | Admitting: Internal Medicine

## 2019-03-20 DIAGNOSIS — M67972 Unspecified disorder of synovium and tendon, left ankle and foot: Secondary | ICD-10-CM

## 2019-03-20 DIAGNOSIS — F17200 Nicotine dependence, unspecified, uncomplicated: Secondary | ICD-10-CM

## 2019-03-20 DIAGNOSIS — R55 Syncope and collapse: Secondary | ICD-10-CM

## 2019-03-20 DIAGNOSIS — I10 Essential (primary) hypertension: Secondary | ICD-10-CM

## 2019-03-20 DIAGNOSIS — L84 Corns and callosities: Secondary | ICD-10-CM

## 2019-03-20 MED ORDER — CARVEDILOL 25 MG PO TABS: 25.0000 mg | ORAL_TABLET | Freq: Two times a day (BID) | ORAL | 6 refills | Status: DC

## 2019-03-20 MED ORDER — AMLODIPINE BESYLATE 10 MG PO TABS: 10.0000 mg | ORAL_TABLET | Freq: Every day | ORAL | 6 refills | Status: DC

## 2019-03-20 MED ORDER — SPIRONOLACTONE 50 MG PO TABS: 50.0000 mg | ORAL_TABLET | Freq: Every day | ORAL | 6 refills | Status: DC

## 2019-03-20 MED FILL — ?SPIRONOLACTON 50MG TABLET: 50 | 30 days supply | Qty: 30 | Fill #0

## 2019-03-20 MED FILL — ?CARVEDILOL 25MG TABLE: 25 | 30 days supply | Qty: 60 | Fill #0

## 2019-03-20 MED FILL — ?AMLODIPINE BESYLATE 10 MG: 10 | 30 days supply | Qty: 30 | Fill #0

## 2019-03-20 NOTE — Progress Notes (Signed)
Virtual Visit via Telephone Note Due to current restrictions/limitations of in-office visits due to the COVID-19 pandemic, this scheduled clinical appointment was converted to a telehealth visit  I connected with [name] on 03/20/19 at 10:46 a.m by telephone and verified that I am speaking with the correct person using two identifiers. I am in my office.  The patient is at home.  Only the patient and myself participated in this encounter.  I discussed the limitations, risks, security and privacy concerns of performing an evaluation and management service by telephone and the availability of in person appointments. I also discussed with the patient that there may be a patient responsible charge related to this service. The patient expressed understanding and agreed to proceed.   History of Present Illness: Hx ofpoorlyHTN (inconsistent in taking meds), tob dep, poor dentition.  Last seen 01/29/2019.   Pt reports he "had a vagal" on Christmas day.  Reports this is his 3 episode since 2014. -he ate and went to the restroom to have a BM.  Felt weak, clammy and nauseated.  He fell to the floor. Did not loss consciousness.  He laid on cool floor for a while then drank some water.  Had similar episodes x 2  in 2014.  Both episodes occurred when he was having a BM.  Loss consciousness on first episode Pt has old bottle of Clonidine 0.2 mg which he takes when he rans out of his BP meds.  Last took it in 12/2018.     HTN: On last visit, pt was off meds for few mths because he was not coming to pick up RFs.  Since last visit, he did get meds and taking consistently Checking BP 2 x a wk.  Last reading 3 days ago was 152/95, P 78.    Disorder LT Achilles/Callous foot:  He has completed forms for [facility] discount and will bring them in next week at which time he will schedule an appointment with the financial counselor here  Tob dep:  Still smoking cigars.  He is down to 2 a day from 5 a day. Trying  to quit completely.  Plans to continue to taper off.   Hb on recent blood test was 17.9   Observations/Objective: Results for orders placed or performed in visit on 01/29/19  TSH  Result Value Ref Range   TSH 0.716 0.450 - 4.500 uIU/mL  CBC  Result Value Ref Range   WBC 6.3 3.4 - 10.8 x10E3/uL   RBC 5.99 (H) 4.14 - 5.80 x10E6/uL   Hemoglobin 17.9 (H) 13.0 - 17.7 g/dL   Hematocrit 50.5 37.5 - 51.0 %   MCV 84 79 - 97 fL   MCH 29.9 26.6 - 33.0 pg   MCHC 35.4 31.5 - 35.7 g/dL   RDW 13.1 11.6 - 15.4 %   Platelets 343 150 - 450 x10E3/uL  Comprehensive metabolic panel  Result Value Ref Range   Glucose 90 65 - 99 mg/dL   BUN 10 6 - 24 mg/dL   Creatinine, Ser 1.13 0.76 - 1.27 mg/dL   GFR calc non Af Amer 79 >59 mL/min/1.73   GFR calc Af Amer 91 >59 mL/min/1.73   BUN/Creatinine Ratio 9 9 - 20   Sodium 140 134 - 144 mmol/L   Potassium 4.4 3.5 - 5.2 mmol/L   Chloride 100 96 - 106 mmol/L   CO2 25 20 - 29 mmol/L   Calcium 9.6 8.7 - 10.2 mg/dL   Total Protein 8.0 6.0 -  8.5 g/dL   Albumin 4.3 4.0 - 5.0 g/dL   Globulin, Total 3.7 1.5 - 4.5 g/dL   Albumin/Globulin Ratio 1.2 1.2 - 2.2   Bilirubin Total 0.7 0.0 - 1.2 mg/dL   Alkaline Phosphatase 116 39 - 117 IU/L   AST 16 0 - 40 IU/L   ALT 17 0 - 44 IU/L  Rheumatoid factor  Result Value Ref Range   Rhuematoid fact SerPl-aCnc 19.6 (H) 0.0 - [id number] IU/mL  CYCLIC CITRUL PEPTIDE ANTIBODY, IGG/IGA  Result Value Ref Range   Cyclic Citrullin Peptide Ab 7 0 - 19 units     Assessment and Plan: 1. Syncope, near -All 3 episodes sounds like reflex syncope.  Advised patient to try to keep bowel movements soft so that he is not having to strain when he have bowel movements.  Use over-the-counter stool softener if needed. -In the future, when he feels an episode coming on, I recommend lying down and elevating the legs or doing a forceful handgrip. -We will consider referral to cardiology once he is approved for the orange card/cone discount  card  2. Essential hypertension Advised him to check the blood pressure at least 1 to twice a week and record the readings.  His most recent home reading is much improved but not at goal.  I recommend increase carvedilol to 25 mg twice a day. - carvedilol (COREG) 25 MG tablet; Take 1 tablet (25 mg total) by mouth 2 (two) times daily with a meal.  Dispense: 60 tablet; Refill: 6 - amLODipine (NORVASC) 10 MG tablet; Take 1 tablet (10 mg total) by mouth daily.  Dispense: 30 tablet; Refill: 6 - spironolactone (ALDACTONE) 50 MG tablet; Take 1 tablet (50 mg total) by mouth daily.  Dispense: 30 tablet; Refill: 6  3. Tobacco dependence Commended him on continuing to cut back.  Encouraged him to set a quit date.  4. Disorder of left Achilles tendon 5. Pre-ulcerative corn or callous Plan for referral to podiatry once he is approved for the orange card/cone discount   Follow Up Instructions: 6 wks   I discussed the assessment and treatment plan with the patient. The patient was provided an opportunity to ask questions and all were answered. The patient agreed with the plan and demonstrated an understanding of the instructions.   The patient was advised to call back or seek an in-person evaluation if the symptoms worsen or if the condition fails to improve as anticipated.  I provided 29 minutes of non-face-to-face time during this encounter.   Karle Plumber, MD

## 2019-04-13 ENCOUNTER — Ambulatory Visit: Payer: Self-pay | Admitting: Internal Medicine

## 2019-04-22 MED FILL — ?CLINDAMYCIN HCL 150MG CAPS: 150 | 6 days supply | Qty: 18 | Fill #0

## 2019-04-22 MED FILL — traMADol HCL 50 MG TABS: 50 | 3 days supply | Qty: 10 | Fill #0

## 2019-04-22 MED FILL — IBUPROFEN 800 MG TABLET: 800 | 5 days supply | Qty: 18 | Fill #0

## 2019-04-30 ENCOUNTER — Ambulatory Visit: Payer: Self-pay | Attending: Internal Medicine | Admitting: Internal Medicine

## 2019-04-30 ENCOUNTER — Other Ambulatory Visit: Payer: Self-pay

## 2019-04-30 DIAGNOSIS — M5416 Radiculopathy, lumbar region: Secondary | ICD-10-CM

## 2019-04-30 MED ORDER — PREDNISONE 20 MG PO TABS: 20.0000 mg | ORAL_TABLET | Freq: Every day | ORAL | 0 refills | Status: DC

## 2019-04-30 MED ORDER — METHOCARBAMOL 500 MG PO TABS: 500.0000 mg | ORAL_TABLET | Freq: Three times a day (TID) | ORAL | 1 refills | Status: DC | PRN

## 2019-04-30 MED ORDER — TRAMADOL HCL 50 MG PO TABS: 50.0000 mg | ORAL_TABLET | Freq: Four times a day (QID) | ORAL | 0 refills | Status: DC | PRN

## 2019-04-30 MED FILL — predniSONE 20 MG TABS: 20 | 5 days supply | Qty: 5 | Fill #0

## 2019-04-30 MED FILL — METHOCARBAMOL 500 MG TABS: 500 | 10 days supply | Qty: 30 | Fill #0

## 2019-04-30 NOTE — Progress Notes (Signed)
Pt states there is something else going on.   Pt states his right side of his back is giving him some issues due to the fact that some days the pain is unbearable and it's hard for him to put weight on it.    Pt states he is wanting to know if it's kidneys.   Pt is wanting to give a urine to check kidneys

## 2019-04-30 NOTE — Progress Notes (Signed)
Virtual Visit via Telephone Note Due to current restrictions/limitations of in-office visits due to the COVID-19 pandemic, this scheduled clinical appointment was converted to a telehealth visit  I connected with [name] on 04/30/19 at 1:59 p.m EST by telephone and verified that I am speaking with the correct person using two identifiers. I am in my office.  The patient is at home.  Only the patient and myself participated in this encounter.  I discussed the limitations, risks, security and privacy concerns of performing an evaluation and management service by telephone and the availability of in person appointments. I also discussed with the patient that there may be a patient responsible charge related to this service. The patient expressed understanding and agreed to proceed.   History of Present Illness: Hx ofpoorlyHTN, tob dep, poor dentition, chronic lower back pain, history of reflex syncope, callus of left Achilles tendon.  Last evaluated 03/20/2019.  Pt felt he pulled a muscle in his lower back at work about 1 mth ago. He went on vacation x 1 wk and took it easy.  Usually similar pain would get better with taking some time off but this has not.  He wonders whether he has a kidney stone or kidney infection.  He would like to have his urine checked. Pain mainly in RT lower back. Hurts when he puts wgh on that side, with position changes, sometimes pain when he raises RT leg to lay down in bed.   Radiates down the RT leg to level of thigh No numbness or tingling No loss of bowel or bladder function.  Stools were discolored. No dysuria or hematuria.   Out of Flexeril but did take one of his mother's muscle relaxant (Robaxin) which helped a little and did not cause drowsiness for him.  He is also using the Voltaren gel. 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  He still has  not completed the process for getting the orange card/cone discount.  He states that he has his completed application and plans to stop at the front desk when he comes to the clinic to pick up any medication that I prescribed for him today.    Outpatient Encounter Medications as of 04/30/2019  Medication Sig  . acetaminophen (TYLENOL 8 HOUR) 650 MG CR tablet Take 1 tablet (650 mg total) by mouth every 8 (eight) hours as needed for pain.  Marland Kitchen amLODipine (NORVASC) 10 MG tablet Take 1 tablet (10 mg total) by mouth daily.  . carvedilol (COREG) 25 MG tablet Take 1 tablet (25 mg total) by mouth 2 (two) times daily with a meal.  . cyclobenzaprine (FLEXERIL) 5 MG tablet Take 1 tablet (5 mg total) by mouth 2 (two) times daily as needed for muscle spasms.  . diclofenac Sodium (VOLTAREN) 1 % GEL Apply 2 g topically 4 (four) times daily.  Marland Kitchen spironolactone (ALDACTONE) 50 MG tablet Take 1 tablet (50 mg total) by mouth daily.   No facility-administered encounter medications on file as of 04/30/2019.    Observations/Objective: No direct observation done as this was a telephone encounter  Assessment and Plan: 1. Lumbar radiculopathy, acute This is acute on chronic back pain likely due to nerve irritation or slipped disc.  I told patient that history does not suggest bladder infection or kidney stone however he still wants to have the urine checked. Advised patient to use a heating pad  when sitting and laying down. Avoid heavy lifting. We will give tramadol to use as needed for pain.  Advised that medication can cause drowsiness. NCCSRS reviwed. Limited supply of prednisone because I do not want to prescribe an NSAID due to his blood pressure issues. Robaxin to use as needed. - Urinalysis, Complete; Future - predniSONE (DELTASONE) 20 MG tablet; Take 1 tablet (20 mg total) by mouth daily with breakfast.  Dispense: 5 tablet; Refill: 0 - traMADol (ULTRAM) 50 MG tablet; Take 1 tablet (50 mg total) by mouth every 6  (six) hours as needed.  Dispense: 30 tablet; Refill: 0 - methocarbamol (ROBAXIN) 500 MG tablet; Take 1 tablet (500 mg total) by mouth every 8 (eight) hours as needed for muscle spasms.  Dispense: 30 tablet; Refill: 1   Follow Up Instructions: Follow-up if no improvement.  Otherwise we will see him in 3 months for chronic disease management.   I discussed the assessment and treatment plan with the patient. The patient was provided an opportunity to ask questions and all were answered. The patient agreed with the plan and demonstrated an understanding of the instructions.   The patient was advised to call back or seek an in-person evaluation if the symptoms worsen or if the condition fails to improve as anticipated.  I provided 15 minutes of non-face-to-face time during this encounter.   Karle Plumber, MD

## 2019-05-06 MED FILL — ?CLINDAMYCIN HCL 150MG CAPS: 150 | 6 days supply | Qty: 18 | Fill #0

## 2019-05-06 MED FILL — traMADol HCL 50 MG TABS: 50 | 3 days supply | Qty: 10 | Fill #0

## 2019-05-07 ENCOUNTER — Other Ambulatory Visit: Payer: Self-pay

## 2019-05-07 ENCOUNTER — Ambulatory Visit: Payer: Self-pay | Attending: Internal Medicine

## 2019-05-07 DIAGNOSIS — Z113 Encounter for screening for infections with a predominantly sexual mode of transmission: Secondary | ICD-10-CM

## 2019-05-07 DIAGNOSIS — M5416 Radiculopathy, lumbar region: Secondary | ICD-10-CM

## 2019-05-08 LAB — URINALYSIS, COMPLETE
Bilirubin, UA: NEGATIVE
Glucose, UA: NEGATIVE
Ketones, UA: NEGATIVE
Leukocytes,UA: NEGATIVE
Nitrite, UA: NEGATIVE
Protein,UA: NEGATIVE
RBC, UA: NEGATIVE
Specific Gravity, UA: 1.022 (ref 1.005–1.030)
Urobilinogen, Ur: 0.2 mg/dL (ref 0.2–1.0)
pH, UA: 5.5 (ref 5.0–7.5)

## 2019-05-08 LAB — MICROSCOPIC EXAMINATION
Bacteria, UA: NONE SEEN
Casts: NONE SEEN /lpf
Epithelial Cells (non renal): NONE SEEN /hpf (ref 0–10)

## 2019-05-12 LAB — URINE CYTOLOGY ANCILLARY ONLY
Bacterial Vaginitis-Urine: NEGATIVE
Candida Urine: NEGATIVE
Chlamydia: NEGATIVE
Comment: NEGATIVE
Comment: NEGATIVE
Comment: NORMAL
Neisseria Gonorrhea: NEGATIVE
Trichomonas: NEGATIVE

## 2019-05-19 MED FILL — ?CARVEDILOL 25 MG TABLET: 25 | 30 days supply | Qty: 60 | Fill #1

## 2019-05-19 MED FILL — ?SPIRONOLACTONE 50 MG TABLE: 50 | 30 days supply | Qty: 30 | Fill #1

## 2019-05-19 MED FILL — AMLODIPINE BESYLATE 10 MG T: 10 | 30 days supply | Qty: 30 | Fill #1

## 2019-05-20 MED FILL — ?IBUPROFEN 800 MG TABS: AMNEAL | 6 days supply | Qty: 18 | Fill #0

## 2019-05-20 MED FILL — traMADol HCL 50 MG TABS: 50 | 2 days supply | Qty: 10 | Fill #0

## 2019-06-08 ENCOUNTER — Telehealth: Payer: Self-pay

## 2019-06-08 DIAGNOSIS — K047 Periapical abscess without sinus: Secondary | ICD-10-CM

## 2019-06-08 MED ORDER — CLINDAMYCIN HCL 150 MG PO CAPS: 150.0000 mg | ORAL_CAPSULE | Freq: Three times a day (TID) | ORAL | 0 refills | Status: AC

## 2019-06-08 MED ORDER — IBUPROFEN 800 MG PO TABS: 800.0000 mg | ORAL_TABLET | Freq: Three times a day (TID) | ORAL | 0 refills | Status: DC | PRN

## 2019-06-08 MED FILL — ?IBUPROFEN 800 MG TABS: AMNEAL | 10 days supply | Qty: 30 | Fill #0

## 2019-06-08 MED FILL — ?CLINDAMYCIN HCL 150MG CAPS: 150 | 4 days supply | Qty: 12 | Fill #0

## 2019-06-08 NOTE — Telephone Encounter (Signed)
Returned pt call to get mor clarification.   Pt states he went to the dentist and they prescribed him Clindamycin. Pt states he was taken the prescription wrong and the abcess has not gone away. Pt states he contacted the dentist to get a refill but they told him to contact his pcp. Pt is requesting another prescription and would like to use [facility].

## 2019-06-08 NOTE — Telephone Encounter (Signed)
Patient is needing a refill on Clindamycin for tooth infection

## 2019-06-08 NOTE — Telephone Encounter (Signed)
I spoke to the patient.   I plan to rx cleocin 150mg  tid x 4days to our pharmacy and ibuprofen 800mg  tid prn pain as well.  Last Cr was ok so will use NSAIDS for pain and avoid opiates.  Pt to f/u with dentist for tooth extraction.  It involves an abscess on one of his R lower jaw molars that is cracked/cariouis.

## 2019-07-02 MED FILL — AMLODIPINE BESYLATE 10 MG T: 10 | 30 days supply | Qty: 30 | Fill #2

## 2019-07-02 MED FILL — ?SPIRONOLACTONE 50 MG TABLE: 50 | 30 days supply | Qty: 30 | Fill #2

## 2019-07-14 MED FILL — METHOCARBAMOL 500 MG TABS: 500 | 10 days supply | Qty: 30 | Fill #1

## 2019-07-27 ENCOUNTER — Ambulatory Visit: Payer: Self-pay | Attending: Internal Medicine | Admitting: Internal Medicine

## 2019-07-27 ENCOUNTER — Other Ambulatory Visit: Payer: Self-pay

## 2019-07-27 ENCOUNTER — Encounter: Payer: Self-pay | Admitting: Internal Medicine

## 2019-07-27 VITALS — BP 130/90 | HR 70 | Temp 98.1°F | Resp 16 | Wt 220.2 lb

## 2019-07-27 DIAGNOSIS — M160 Bilateral primary osteoarthritis of hip: Secondary | ICD-10-CM

## 2019-07-27 DIAGNOSIS — R198 Other specified symptoms and signs involving the digestive system and abdomen: Secondary | ICD-10-CM

## 2019-07-27 DIAGNOSIS — M545 Low back pain, unspecified: Secondary | ICD-10-CM

## 2019-07-27 DIAGNOSIS — I10 Essential (primary) hypertension: Secondary | ICD-10-CM

## 2019-07-27 MED ORDER — TRAMADOL HCL 50 MG PO TABS: 50.0000 mg | ORAL_TABLET | Freq: Four times a day (QID) | ORAL | 0 refills | Status: DC | PRN

## 2019-07-27 MED ORDER — METHOCARBAMOL 500 MG PO TABS: 500.0000 mg | ORAL_TABLET | Freq: Three times a day (TID) | ORAL | 1 refills | Status: DC | PRN

## 2019-07-27 NOTE — Progress Notes (Signed)
Patient ID: [name], male    DOB: Apr 03, 1975  MRN: [id number]  CC: Hypertension and Back Pain   Subjective: [name] is a 44 y.o. male who presents for chronic ds management His concerns today include:  Hx ofpoorlyHTN, tob dep, poor dentition, chronic lower back pain, history of reflex syncope, callus of left Achilles tendon.  Prior to today BM have not been solid Soft and loose x 2-3 wks. No blood in stools. No Fever. He has gained 9 lbs Has BM within 1 hr after a meal.  This is nl for him; only thing that has change is the change in the consistency of the stools.   Gets sharp pain sometimes in pit of stomach and bowels makes a lot of noise.   Other concern today is flareup of pain in the right lower back and hip.  He points to the iliac crest area.  Started last week after he unloaded a truck at work by himself for 1-1.5 hours.  He works at [employer].  He woke up the next day hurting and sore in his lower back so he took 2 days off to rest.  The pain did not radiate down the legs.  There was no numbness or tingling.  He describes the pain as an ache and stiffness that made it difficult for him to work, move, get off the couch and get in and out of the vehicle.  He has been taking some ibuprofen and took the last of the muscle relaxant Robaxin today.  Today is best he has felt in the past week.  He cannot afford to not work.  He thinks he will need FMLA to allow him to be off on days when he has flareups without being fired.  HTN:  BP better today compared to previous visits. BP in March 134/84.  Doing better with taking his medications but admits that he often forgets to take the evening dose of carvedilol. He tries to limit salt in the foods. Patient Active Problem List   Diagnosis Date Noted  . Syncope, near 03/20/2019  . Pre-ulcerative corn or callous 02/01/2019  . Disorder of left Achilles tendon 02/01/2019  . Inguinal hernia of left side without obstruction or gangrene  12/08/2016  . Tobacco use 12/08/2016  . Localized swelling, mass and lump, multiple sites 12/08/2016  . Subcutaneous nodules 07/09/2016  . Acute left-sided thoracic back pain 02/16/2016  . Left inguinal hernia 12/12/2015  . Dental abscess 05/27/2015  . Periodontitis 05/27/2015  . Pain in left wrist 05/27/2015  . HTN (hypertension) 05/12/2015     Current Outpatient Medications on File Prior to Visit  Medication Sig Dispense Refill  . acetaminophen (TYLENOL 8 HOUR) 650 MG CR tablet Take 1 tablet (650 mg total) by mouth every 8 (eight) hours as needed for pain. 100 tablet 1  . amLODipine (NORVASC) 10 MG tablet Take 1 tablet (10 mg total) by mouth daily. 30 tablet 6  . carvedilol (COREG) 25 MG tablet Take 1 tablet (25 mg total) by mouth 2 (two) times daily with a meal. 60 tablet 6  . diclofenac Sodium (VOLTAREN) 1 % GEL Apply 2 g topically 4 (four) times daily. 100 g 2  . ibuprofen (ADVIL) 800 MG tablet Take 1 tablet (800 mg total) by mouth every 8 (eight) hours as needed for moderate pain. 30 tablet 0  . spironolactone (ALDACTONE) 50 MG tablet Take 1 tablet (50 mg total) by mouth daily. 30 tablet 6  No current facility-administered medications on file prior to visit.    Allergies  Allergen Reactions  . Penicillins Swelling and Rash    Has patient had a PCN reaction causing immediate rash, facial/tongue/throat swelling, SOB or lightheadedness with hypotension: Yes/No:[id number] Yes Has patient had a PCN reaction causing severe rash involving mucus membranes or skin necrosis: No Has patient had a PCN reaction that required hospitalization Yes Has patient had a PCN reaction occurring within the last 10 years:No If all of the above answers are "NO", then may proceed with Cephalospori    Social History   Socioeconomic History  . Marital status: Single    Spouse name: Not on file  . Number of children: Not on file  . Years of education: Not on file  . Highest education level: Not on  file  Occupational History  . Not on file  Tobacco Use  . Smoking status: Former Smoker    Packs/day: 1.00    Types: Cigars    Quit date: 05/10/2015    Years since quitting: 4.2  . Smokeless tobacco: Never Used  Substance and Sexual Activity  . Alcohol use: Yes    Comment: occ  . Drug use: No  . Sexual activity: Not on file  Other Topics Concern  . Not on file  Social History Narrative  . Not on file   Social Determinants of Health   Financial Resource Strain:   . Difficulty of Paying Living Expenses:   Food Insecurity:   . Worried About Charity fundraiser in the Last Year:   . Arboriculturist in the Last Year:   Transportation Needs:   . Film/video editor (Medical):   Marland Kitchen Lack of Transportation (Non-Medical):   Physical Activity:   . Days of Exercise per Week:   . Minutes of Exercise per Session:   Stress:   . Feeling of Stress :   Social Connections:   . Frequency of Communication with Friends and Family:   . Frequency of Social Gatherings with Friends and Family:   . Attends Religious Services:   . Active Member of Clubs or Organizations:   . Attends Archivist Meetings:   Marland Kitchen Marital Status:   Intimate Partner Violence:   . Fear of Current or Ex-Partner:   . Emotionally Abused:   Marland Kitchen Physically Abused:   . Sexually Abused:     Family History  Problem Relation Age of Onset  . Cancer Mother     Past Surgical History:  Procedure Laterality Date  . CHOLECYSTECTOMY      ROS: Review of Systems Negative except as stated above  PHYSICAL EXAM: BP 130/90   Pulse 70   Temp 98.1 F (36.7 C)   Resp 16   Wt 220 lb 3.2 oz (99.9 kg)   SpO2 98%   BMI 27.89 kg/m   Wt Readings from Last 3 Encounters:  07/27/19 220 lb 3.2 oz (99.9 kg)  01/29/19 211 lb 12.8 oz (96.1 kg)  05/15/18 218 lb 12.8 oz (99.2 kg)    Physical Exam   General appearance - alert, well appearing, middle-aged African-American male and in no distress Mental status - normal  mood, behavior, speech, dress, motor activity, and thought processes Chest - clear to auscultation, no wheezes, rales or rhonchi, symmetric air entry Heart - normal rate, regular rhythm, normal S1, S2, no murmurs, rubs, clicks or gallops Abdomen - soft, nontender, nondistended, no masses or organomegaly Musculoskeletal - no tenderness on palpation of  the LS spine.  Straight leg raise is negative.  Power in the lower extremities 5/5 bilaterally.  Gait is normal. Extremities - peripheral pulses normal, no pedal edema, no clubbing or cyanosis  CMP Latest Ref Rng & Units 01/29/2019 04/08/2018 08/14/2017  Glucose 65 - 99 mg/dL 90 118(H) 90  BUN 6 - 24 mg/dL 10 9 11   Creatinine 0.76 - 1.27 mg/dL 1.13 1.06 1.07  Sodium 134 - 144 mmol/L 140 140 142  Potassium 3.5 - 5.2 mmol/L 4.4 4.3 4.4  Chloride 96 - 106 mmol/L 100 101 101  CO2 20 - 29 mmol/L 25 24 27   Calcium 8.7 - 10.2 mg/dL 9.6 9.6 9.9  Total Protein 6.0 - 8.5 g/dL 8.0 7.4 7.9  Total Bilirubin 0.0 - 1.2 mg/dL 0.7 0.4 0.4  Alkaline Phos 39 - 117 IU/L 116 94 91  AST 0 - 40 IU/L 16 9 19   ALT 0 - 44 IU/L 17 17 23    Lipid Panel     Component Value Date/Time   CHOL 115 04/08/2018 0945   TRIG 89 04/08/2018 0945   HDL 35 (L) 04/08/2018 0945   CHOLHDL 3.3 04/08/2018 0945   LDLCALC 62 04/08/2018 0945    CBC    Component Value Date/Time   WBC 6.3 01/29/2019 1044   WBC 7.3 10/31/2016 0821   RBC 5.99 (H) 01/29/2019 1044   RBC 5.02 10/31/2016 0821   HGB 17.9 (H) 01/29/2019 1044   HCT 50.5 01/29/2019 1044   PLT 343 01/29/2019 1044   MCV 84 01/29/2019 1044   MCH 29.9 01/29/2019 1044   MCH 30.3 10/31/2016 0821   MCHC 35.4 01/29/2019 1044   MCHC 36.3 (H) 10/31/2016 0821   RDW 13.1 01/29/2019 1044   LYMPHSABS 3.9 (H) 08/14/2017 1630   MONOABS 0.5 10/19/2015 2151   EOSABS 0.2 08/14/2017 1630   BASOSABS 0.1 08/14/2017 1630    ASSESSMENT AND PLAN:  1. Essential hypertension Not at goal.  We discussed ways to help him to remember to take  the evening dose of carvedilol.  Continue other medications and low-salt diet.  2. Acute right-sided low back pain without sciatica Patient with known multilevel degenerative changes of lumbar spine based on x-ray done back in 2018.  He experienced intermittent flares associated with heavy lifting.  We discussed whether his employer would allow him to do jobs that would not require lifting.  Patient states all of those jobs are filled.  He will bring FMLA form for me to complete for him 2 days/month or every other month when he has a flareup.  I have given a limited refill on tramadol to use as needed when he has a flare.  Refill Robaxin - traMADol (ULTRAM) 50 MG tablet; Take 1 tablet (50 mg total) by mouth every 6 (six) hours as needed.  Dispense: 30 tablet; Refill: 0 - methocarbamol (ROBAXIN) 500 MG tablet; Take 1 tablet (500 mg total) by mouth every 8 (eight) hours as needed for muscle spasms.  Dispense: 30 tablet; Refill: 1  3. Primary osteoarthritis of both hips See #2 above  4. Change in bowel movement Patient to avoid foods that may cause bowel movements to be loose.  Try Imodium once a week.  If it persists, he will let me know so that we can refer to GI.   Patient was given the opportunity to ask questions.  Patient verbalized understanding of the plan and was able to repeat key elements of the plan.   No orders of the  defined types were placed in this encounter.    Requested Prescriptions   Signed Prescriptions Disp Refills  . traMADol (ULTRAM) 50 MG tablet 30 tablet 0    Sig: Take 1 tablet (50 mg total) by mouth every 6 (six) hours as needed.  . methocarbamol (ROBAXIN) 500 MG tablet 30 tablet 1    Sig: Take 1 tablet (500 mg total) by mouth every 8 (eight) hours as needed for muscle spasms.    Return in about 4 months (around 11/27/2019).  Karle Plumber, MD, FACP

## 2019-07-28 MED FILL — METHOCARBAMOL 500 MG TABS: 500 | 10 days supply | Qty: 30 | Fill #0

## 2019-07-28 MED FILL — traMADol HCL 50 MG TABS: 50 | 7 days supply | Qty: 30 | Fill #0

## 2019-08-12 MED FILL — ?SPIRONOLACTONE 50 MG TABLE: 50 | 30 days supply | Qty: 30 | Fill #3

## 2019-08-12 MED FILL — METHOCARBAMOL 500 MG TABS: 500 | 10 days supply | Qty: 30 | Fill #0

## 2019-08-12 MED FILL — ?CARVEDILOL 25 MG TABLET: 25 | 30 days supply | Qty: 60 | Fill #2

## 2019-08-12 MED FILL — traMADol HCL 50 MG TABS: 50 | 7 days supply | Qty: 30 | Fill #0

## 2019-08-12 MED FILL — AMLODIPINE BESYLATE 10 MG T: 10 | 30 days supply | Qty: 30 | Fill #3

## 2019-08-28 MED FILL — METHOCARBAMOL 500 MG TABS: 500 | 10 days supply | Qty: 30 | Fill #1

## 2019-08-28 MED FILL — traMADol HCL 50 MG TABS: 50 | 7 days supply | Qty: 30 | Fill #0

## 2019-09-22 ENCOUNTER — Other Ambulatory Visit: Payer: Self-pay | Admitting: Internal Medicine

## 2019-09-22 DIAGNOSIS — I10 Essential (primary) hypertension: Secondary | ICD-10-CM

## 2019-09-22 MED ORDER — AMLODIPINE BESYLATE 10 MG PO TABS: 10.0000 mg | ORAL_TABLET | Freq: Every day | ORAL | 1 refills | Status: DC

## 2019-09-22 MED ORDER — CARVEDILOL 25 MG PO TABS: 25.0000 mg | ORAL_TABLET | Freq: Two times a day (BID) | ORAL | 1 refills | Status: DC

## 2019-09-22 MED ORDER — SPIRONOLACTONE 50 MG PO TABS: 50.0000 mg | ORAL_TABLET | Freq: Every day | ORAL | 1 refills | Status: DC

## 2019-09-22 NOTE — Telephone Encounter (Signed)
Medication Refill - Medication: amLODipine (NORVASC) 10 MG tablet carvedilol (COREG) 25 MG tablet spironolactone (ALDACTONE) 50 MG tablet     Preferred Pharmacy (with phone number or street name):  [facility], [address] Phone:  [phone]  Fax:  [phone]       Agent: Please be advised that RX refills may take up to 3 business days. We ask that you follow-up with your pharmacy.

## 2019-09-22 NOTE — Telephone Encounter (Signed)
Requested Prescriptions  Pending Prescriptions Disp Refills   amLODipine (NORVASC) 10 MG tablet 90 tablet 1    Sig: Take 1 tablet (10 mg total) by mouth daily.     Cardiovascular:  Calcium Channel Blockers Failed - 09/22/2019  4:26 PM      Failed - Last BP in normal range    BP Readings from Last 1 Encounters:  07/27/19 130/90         Passed - Valid encounter within last 6 months    Recent Outpatient Visits          1 month ago Essential hypertension   [place] Ladell Pier, MD   4 months ago Lumbar radiculopathy, acute   [place], MD   6 months ago Syncope, near   [place], Deborah B, MD   7 months ago Essential hypertension   [place], Deborah B, MD   1 year ago Essential hypertension   [place], MD      Future Appointments            In 2 months Ladell Pier, MD [place]            carvedilol (COREG) 25 MG tablet 180 tablet 1    Sig: Take 1 tablet (25 mg total) by mouth 2 (two) times daily with a meal.     Cardiovascular:  Beta Blockers Failed - 09/22/2019  4:26 PM      Failed - Last BP in normal range    BP Readings from Last 1 Encounters:  07/27/19 130/90         Passed - Last Heart Rate in normal range    Pulse Readings from Last 1 Encounters:  07/27/19 70         Passed - Valid encounter within last 6 months    Recent Outpatient Visits          1 month ago Essential hypertension   [place], Deborah B, MD   4 months ago Lumbar radiculopathy, acute   [place], Deborah B, MD   6 months ago Syncope, near   [place], Deborah B, MD   7 months ago Essential hypertension   [place], Deborah B, MD   1 year ago Essential hypertension   [place], MD      Future Appointments            In 2 months Ladell Pier, MD [place]            spironolactone (ALDACTONE) 50 MG tablet 90 tablet 1    Sig: Take 1 tablet (50 mg total) by mouth daily.     Cardiovascular: Diuretics - Aldosterone Antagonist Failed - 09/22/2019  4:26 PM      Failed - Last BP in normal range    BP Readings from Last 1 Encounters:  07/27/19 130/90         Passed - Cr in normal range and within 360 days    Creat  Date Value Ref Range Status  12/12/2015 0.97 0.60 - 1.35 mg/dL Final   Creatinine, Ser  Date Value  Ref Range Status  01/29/2019 1.13 0.76 - 1.27 mg/dL Final   Creatinine,U  Date Value Ref Range Status  05/05/2008 145.5 mg/dL Final    Comment:    See lab report for associated comment(s)   Creatinine, Urine  Date Value Ref Range Status  12/12/2015 210 20 - 370 mg/dL Final         Passed - K in normal range and within 360 days    Potassium  Date Value Ref Range Status  01/29/2019 4.4 3.5 - 5.2 mmol/L Final         Passed - Na in normal range and within 360 days    Sodium  Date Value Ref Range Status  01/29/2019 140 134 - 144 mmol/L Final         Passed - Valid encounter within last 6 months    Recent Outpatient Visits          1 month ago Essential hypertension   [place], MD   4 months ago Lumbar radiculopathy, acute   [place], MD   6 months ago Syncope, near   [place], MD   7 months ago Essential hypertension   [place], Deborah B, MD   1 year ago Essential hypertension   [place],  MD      Future Appointments            In 2 months [name], MD [place]

## 2019-09-23 MED FILL — ?CARVEDILOL 25 MG TABLET: 25 | 30 days supply | Qty: 60 | Fill #0

## 2019-09-23 MED FILL — ?SPIRONOLACTONE 50 MG TABLE: 50 | 30 days supply | Qty: 30 | Fill #0

## 2019-09-23 MED FILL — ?AMLODIPINE BESYL 10MG TABL: 10 | 90 days supply | Qty: 90 | Fill #0

## 2019-11-02 ENCOUNTER — Other Ambulatory Visit: Payer: Self-pay | Admitting: Internal Medicine

## 2019-11-02 DIAGNOSIS — M545 Low back pain, unspecified: Secondary | ICD-10-CM

## 2019-11-02 MED FILL — ?SPIRONOLACTONE 50 MG TABLE: 50 | 30 days supply | Qty: 30 | Fill #1

## 2019-11-02 NOTE — Telephone Encounter (Signed)
Requested medication (s) are due for refill today: {yes  Requested medication (s) are on the active medication list: {yes  Last refill: 07/27/19   #30  1 refill  Future visit scheduled Yes 11/26/19  Notes to clinic: not delegated  Requested Prescriptions  Pending Prescriptions Disp Refills   methocarbamol (ROBAXIN) 500 MG tablet [Pharmacy Med Name: METHOCARBAMOL 500 MG TABS 500 Tablet] 30 tablet 1    Sig: Take 1 tablet (500 mg total) by mouth every 8 (eight) hours as needed for muscle spasms.      Not Delegated - Analgesics:  Muscle Relaxants Failed - 11/02/2019 11:58 AM      Failed - This refill cannot be delegated      Passed - Valid encounter within last 6 months    Recent Outpatient Visits           3 months ago Essential hypertension   [place], MD   6 months ago Lumbar radiculopathy, acute   [place], MD   7 months ago Syncope, near   [place], Deborah B, MD   9 months ago Essential hypertension   [place], Deborah B, MD   1 year ago Essential hypertension   [place], MD       Future Appointments             In 3 weeks Ladell Pier, MD [place]

## 2019-11-03 MED FILL — METHOCARBAMOL 500 MG TABS: 500 | 10 days supply | Qty: 30 | Fill #0

## 2019-11-24 MED FILL — METHOCARBAMOL 500 MG TABS: 500 | 10 days supply | Qty: 30 | Fill #1

## 2019-11-26 ENCOUNTER — Other Ambulatory Visit: Payer: Self-pay

## 2019-11-26 ENCOUNTER — Ambulatory Visit: Payer: Self-pay | Attending: Internal Medicine | Admitting: Family

## 2019-11-26 ENCOUNTER — Encounter: Payer: Self-pay | Admitting: Family

## 2019-11-26 VITALS — BP 148/99 | HR 74 | Resp 16 | Wt 220.2 lb

## 2019-11-26 DIAGNOSIS — M25551 Pain in right hip: Secondary | ICD-10-CM

## 2019-11-26 DIAGNOSIS — M79651 Pain in right thigh: Secondary | ICD-10-CM

## 2019-11-26 NOTE — Progress Notes (Signed)
Patient ID: MOHAB ASHBY, male    DOB: 1975-06-16  MRN: [id number]  CC: Leg Pain  Subjective: [name] is a 44 y.o. male with history of hypertension who presents for right leg pain.  1. LEG PAIN: Onset: 3 weeks  Location: right thigh, right hip  Radiation: denies  Pain Rating: 9/10  Pain Duration: comes and goes  Characteristics: sharp and achy  Aggravating Factors: movement, putting on pants, getting in and out of car Relieving Factors: muscle relaxant, Ibuprofen, Motrin Trauma: denies  Ambulation difficulty: denies Redness: denies Swelling: denies  Tenderness: denies  Warmth: denies  Shortness of breath: denies Chest pain: denies Smoking: occasional   Patient Active Problem List   Diagnosis Date Noted  . Syncope, near 03/20/2019  . Pre-ulcerative corn or callous 02/01/2019  . Disorder of left Achilles tendon 02/01/2019  . Inguinal hernia of left side without obstruction or gangrene 12/08/2016  . Tobacco use 12/08/2016  . Localized swelling, mass and lump, multiple sites 12/08/2016  . Subcutaneous nodules 07/09/2016  . Acute left-sided thoracic back pain 02/16/2016  . Left inguinal hernia 12/12/2015  . Dental abscess 05/27/2015  . Periodontitis 05/27/2015  . Pain in left wrist 05/27/2015  . HTN (hypertension) 05/12/2015     Current Outpatient Medications on File Prior to Visit  Medication Sig Dispense Refill  . acetaminophen (TYLENOL 8 HOUR) 650 MG CR tablet Take 1 tablet (650 mg total) by mouth every 8 (eight) hours as needed for pain. 100 tablet 1  . amLODipine (NORVASC) 10 MG tablet Take 1 tablet (10 mg total) by mouth daily. 90 tablet 1  . carvedilol (COREG) 25 MG tablet Take 1 tablet (25 mg total) by mouth 2 (two) times daily with a meal. 180 tablet 1  . diclofenac Sodium (VOLTAREN) 1 % GEL Apply 2 g topically 4 (four) times daily. 100 g 2  . ibuprofen (ADVIL) 800 MG tablet Take 1 tablet (800 mg total) by mouth every 8 (eight) hours as needed for  moderate pain. 30 tablet 0  . methocarbamol (ROBAXIN) 500 MG tablet TAKE 1 TABLET (500 MG TOTAL) BY MOUTH EVERY 8 (EIGHT) HOURS AS NEEDED FOR MUSCLE SPASMS. 30 tablet 1  . spironolactone (ALDACTONE) 50 MG tablet Take 1 tablet (50 mg total) by mouth daily. 90 tablet 1  . traMADol (ULTRAM) 50 MG tablet Take 1 tablet (50 mg total) by mouth every 6 (six) hours as needed. 30 tablet 0   No current facility-administered medications on file prior to visit.    Social History   Socioeconomic History  . Marital status: Single    Spouse name: Not on file  . Number of children: Not on file  . Years of education: Not on file  . Highest education level: Not on file  Occupational History  . Not on file  Tobacco Use  . Smoking status: Former Smoker    Packs/day: 1.00    Types: Cigars    Quit date: 05/10/2015    Years since quitting: 4.5  . Smokeless tobacco: Never Used  Vaping Use  . Vaping Use: Never used  Substance and Sexual Activity  . Alcohol use: Yes    Comment: occ  . Drug use: No  . Sexual activity: Not on file  Other Topics Concern  . Not on file  Social History Narrative  . Not on file   Social Determinants of Health   Financial Resource Strain:   . Difficulty of Paying Living Expenses: Not on file  Food Insecurity:   . Worried About Charity fundraiser in the Last Year: Not on file  . Ran Out of Food in the Last Year: Not on file  Transportation Needs:   . Lack of Transportation (Medical): Not on file  . Lack of Transportation (Non-Medical): Not on file  Physical Activity:   . Days of Exercise per Week: Not on file  . Minutes of Exercise per Session: Not on file  Stress:   . Feeling of Stress : Not on file  Social Connections:   . Frequency of Communication with Friends and Family: Not on file  . Frequency of Social Gatherings with Friends and Family: Not on file  . Attends Religious Services: Not on file  . Active Member of Clubs or Organizations: Not on file  .  Attends Archivist Meetings: Not on file  . Marital Status: Not on file  Intimate Partner Violence:   . Fear of Current or Ex-Partner: Not on file  . Emotionally Abused: Not on file  . Physically Abused: Not on file  . Sexually Abused: Not on file    Family History  Problem Relation Age of Onset  . Cancer Mother     Past Surgical History:  Procedure Laterality Date  . CHOLECYSTECTOMY      ROS: Review of Systems Negative except as stated above  PHYSICAL EXAM: Vitals with BMI 11/26/2019 07/27/2019 07/27/2019  Height - - -  Weight 220 lbs 3 oz - 220 lbs 3 oz  BMI - - -  Systolic 315 400 867  Diastolic 99 90 94  Pulse 74 - 70   Wt Readings from Last 3 Encounters:  11/26/19 220 lb 3.2 oz (99.9 kg)  07/27/19 220 lb 3.2 oz (99.9 kg)  01/29/19 211 lb 12.8 oz (96.1 kg)   Physical Exam General appearance - alert, well appearing, and in no distress and oriented to person, place, and time Mental status - alert, oriented to person, place, and time, normal mood, behavior, speech, dress, motor activity, and thought processes Neck - supple, no significant adenopathy Lymphatics - no palpable lymphadenopathy, no hepatosplenomegaly Chest - clear to auscultation, no wheezes, rales or rhonchi, symmetric air entry, no tachypnea, retractions or cyanosis Heart - normal rate, regular rhythm, normal S1, S2, no murmurs, rubs, clicks or gallops Neurological - alert, oriented, normal speech, no focal findings or movement disorder noted, neck supple without rigidity, cranial nerves II through XII intact, DTR's normal and symmetric, motor and sensory grossly normal bilaterally, normal muscle tone, no tremors, strength 5/5, Romberg sign negative, normal gait and station Musculoskeletal - no joint tenderness, deformity or swelling, no muscular tenderness noted, right  lower extremity negative straight leg test with decreased passive range of motion Hip Exam: bilateral Tenderness to palpation:  none  Range of Motion: Full ROM Flexion: Normal Extension: Normal  Abduction: Normal Adduction: Decreased Internal rotation: Decreased External rotation: Normal Muscle Strength:  5/5 bilaterally Extremities - peripheral pulses normal, no pedal edema, no clubbing or cyanosis Skin - normal coloration and turgor; no rashes;  no suspicious skin lesions noted; right lower extremity and right thigh without tenderness to palpation, no erythema, no edema, no warmth  ASSESSMENT AND PLAN: 1. Right thigh pain: - Patient with intermittent right thigh pain for at least 3 weeks. Denies injury and trauma.  - Physical exam negative for erythema, edema, tenderness, and warmth. - May continue Tylenol and ice packs as needed for pain management. - Patient unable to use NSAID's  for pain management because of hypertension. Reports he has still been using Ibuprofen and Motrin because they help more than Tylenol. Blood pressure not at goal during today's visit. Patient asymptomatic without chest pressure, chest pain, palpitations, and shortness of breath. - X-ray of right femur per patient request.  - Follow-up with primary physician as needed. - DG FEMUR, MIN 2 VIEWS RIGHT; Future  2. Right hip pain: - Patient with intermittent right hip pain for at least 3 weeks. Denies injury and trauma. - May continue Tylenol and ice packs as needed for pain management. - Patient unable to use NSAID's for pain management because of hypertension. Reports he has still been using Ibuprofen and Motrin because they help more than Tylenol. Blood pressure not at goal during today's visit. Patient asymptomatic without chest pressure, chest pain, palpitations, and shortness of breath. - X-ray of right hip per patient request. - Follow-up with primary physician as needed. - DG Hip Unilat W OR W/O Pelvis Min 4 Views Right; Future  Patient was given the opportunity to ask questions.  Patient verbalized understanding of the plan and  was able to repeat key elements of the plan. Patient was given clear instructions to go to Emergency Department or return to medical center if symptoms don't improve, worsen, or new problems develop.The patient verbalized understanding.  Camillia Herter, NP

## 2019-11-26 NOTE — Patient Instructions (Addendum)
X-ray of right leg. Continue Tylenol as needed. Follow-up with primary physician as needed.  Leg Cramps Leg cramps occur when one or more muscles tighten and you have no control over this tightening (involuntary muscle contraction). Muscle cramps can develop in any muscle, but the most common place is in the calf muscles of the leg. Those cramps can occur during exercise or when you are at rest. Leg cramps are painful, and they may last for a few seconds to a few minutes. Cramps may return several times before they finally stop. Usually, leg cramps are not caused by a serious medical problem. In many cases, the cause is not known. Some common causes include:  Excessive physical effort (overexertion), such as during intense exercise.  Overuse from repetitive motions, or doing the same thing over and over.  Staying in a certain position for a long period of time.  Improper preparation, form, or technique while performing a sport or an activity.  Dehydration.  Injury.  Side effects of certain medicines.  Abnormally low levels of minerals in your blood (electrolytes), especially potassium and calcium. This could result from: ? Pregnancy. ? Taking diuretic medicines. Follow these instructions at home: Eating and drinking  Drink enough fluid to keep your urine pale yellow. Staying hydrated may help prevent cramps.  Eat a healthy diet that includes plenty of nutrients to help your muscles function. A healthy diet includes fruits and vegetables, lean protein, whole grains, and low-fat or nonfat dairy products. Managing pain, stiffness, and swelling      Try massaging, stretching, and relaxing the affected muscle. Do this for several minutes at a time.  If directed, put ice on areas that are sore or painful after a cramp: ? Put ice in a plastic bag. ? Place a towel between your skin and the bag. ? Leave the ice on for 20 minutes, 2-3 times a day.  If directed, apply heat to muscles  that are tense or tight. Do this before you exercise, or as often as told by your health care provider. Use the heat source that your health care provider recommends, such as a moist heat pack or a heating pad. ? Place a towel between your skin and the heat source. ? Leave the heat on for 20-30 minutes. ? Remove the heat if your skin turns bright red. This is especially important if you are unable to feel pain, heat, or cold. You may have a greater risk of getting burned.  Try taking hot showers or baths to help relax tight muscles. General instructions  If you are having frequent leg cramps, avoid intense exercise for several days.  Take over-the-counter and prescription medicines only as told by your health care provider.  Keep all follow-up visits as told by your health care provider. This is important. Contact a health care provider if:  Your leg cramps get more severe or more frequent, or they do not improve over time.  Your foot becomes cold, numb, or blue. Summary  Muscle cramps can develop in any muscle, but the most common place is in the calf muscles of the leg.  Leg cramps are painful, and they may last for a few seconds to a few minutes.  Usually, leg cramps are not caused by a serious medical problem. Often, the cause is not known.  Stay hydrated and take over-the-counter and prescription medicines only as told by your health care provider. This information is not intended to replace advice given to you by your  health care provider. Make sure you discuss any questions you have with your health care provider. Document Revised: 02/08/2017 Document Reviewed: 12/06/2016 Elsevier Patient Education  2020 [employer].

## 2019-12-28 MED FILL — METHOCARBAMOL 500 MG TABS: 500 | 10 days supply | Qty: 30 | Fill #1

## 2019-12-28 MED FILL — AMLODIPINE BESYLATE 10 MG T: 10 | 30 days supply | Qty: 30 | Fill #1

## 2019-12-28 MED FILL — ?CARVEDILOL 25 MG TABLET: 25 | 30 days supply | Qty: 60 | Fill #1

## 2019-12-28 MED FILL — ?SPIRONOLACTONE 50 MG TABLE: 50 | 30 days supply | Qty: 30 | Fill #2

## 2020-01-13 ENCOUNTER — Other Ambulatory Visit: Payer: Self-pay

## 2020-01-13 MED FILL — METHOCARBAMOL 500 MG TABS: 500 | 10 days supply | Qty: 30 | Fill #1

## 2020-01-13 MED FILL — AMLODIPINE BESYLATE 10 MG T: 10 | 30 days supply | Qty: 30 | Fill #1

## 2020-01-13 MED FILL — CLINDAMYCIN HCL 150 MG CAPS: 150 | 6 days supply | Qty: 18 | Fill #0

## 2020-01-13 MED FILL — ?SPIRONOLACTONE 50 MG TABLE: 50 | 30 days supply | Qty: 30 | Fill #2

## 2020-01-13 MED FILL — ?CARVEDILOL 25 MG TABLET: 25 | 30 days supply | Qty: 60 | Fill #1

## 2020-03-09 MED FILL — SPIRONOLACTONE 50 MG TABS: 50 | 30 days supply | Qty: 30 | Fill #2

## 2020-03-09 MED FILL — METHOCARBAMOL 500 MG TABS: 500 | 10 days supply | Qty: 30 | Fill #1

## 2020-03-09 MED FILL — CLINDAMYCIN HCL 150 MG CAPS: 150 | 6 days supply | Qty: 18 | Fill #0

## 2020-03-09 MED FILL — AMLODIPINE BESYLATE 10 MG T: 10 | 90 days supply | Qty: 90 | Fill #1

## 2020-03-09 MED FILL — CARVEDILOL 25 MG TABLET: 25 | 30 days supply | Qty: 60 | Fill #1

## 2020-03-23 MED FILL — ?CARVEDILOL 25 MG TABLET: 25 | 30 days supply | Qty: 60 | Fill #1

## 2020-03-23 MED FILL — AMLODIPINE BESYLATE 10 MG T: 10 | 30 days supply | Qty: 30 | Fill #1

## 2020-03-23 MED FILL — ?SPIRONOLACTONE 50 MG TABLE: 50 | 30 days supply | Qty: 30 | Fill #2

## 2020-04-18 MED FILL — CLINDAMYCIN HCL 150 MG CAPS: 150 | 6 days supply | Qty: 18 | Fill #0

## 2020-05-11 MED FILL — SPIRONOLACTONE 50 MG TABS: 50 | 30 days supply | Qty: 30 | Fill #3

## 2020-05-11 MED FILL — ?CARVEDILOL 25 MG TABLET: 25 | 30 days supply | Qty: 60 | Fill #2

## 2020-05-11 MED FILL — AMLODIPINE BESYLATE 10 MG T: 10 | 30 days supply | Qty: 30 | Fill #2

## 2020-05-25 ENCOUNTER — Other Ambulatory Visit: Payer: Self-pay

## 2020-05-25 ENCOUNTER — Ambulatory Visit: Payer: Self-pay | Attending: Family Medicine | Admitting: Family Medicine

## 2020-05-25 ENCOUNTER — Other Ambulatory Visit: Payer: Self-pay | Admitting: Family Medicine

## 2020-05-25 DIAGNOSIS — R0982 Postnasal drip: Secondary | ICD-10-CM

## 2020-05-25 MED ORDER — CETIRIZINE HCL 10 MG PO TABS: 10.0000 mg | ORAL_TABLET | Freq: Every day | ORAL | 1 refills | Status: DC

## 2020-05-25 MED ORDER — FLUTICASONE PROPIONATE 50 MCG/ACT NA SUSP: 2.0000 | Freq: Every day | NASAL | 1 refills | Status: DC

## 2020-05-25 NOTE — Progress Notes (Signed)
Virtual Visit via Telephone Note  I connected with [name], on 05/25/2020 at 10:42 AM by telephone due to the COVID-19 pandemic and verified that I am speaking with the correct person using two identifiers.   Consent: I discussed the limitations, risks, security and privacy concerns of performing an evaluation and management service by telephone and the availability of in person appointments. I also discussed with the patient that there may be a patient responsible charge related to this service. The patient expressed understanding and agreed to proceed.   Location of Patient: Work  Biomedical scientist of Provider: Clinic   Persons participating in Telemedicine visit: Quaid Yeakle Farrington-CMA Dr. Margarita Rana     History of Present Illness: 45 year old male with a history of hypertension, tobacco abuse who presents today for an acute visit.   Complains of a 2 day history sore throat with associated dysphagia 'similar to strept  throat' but feels better when he takes 800mg  Ibuprofen. He has a cough, slight pressure on his face, some post nasal drip. He has no malaise, rhinorrhea, fever, myalgias, GI symptoms, dyspnea or chest pain but has some congestion. No history of sick contact He is not vaccinated against COVID-19 but states he has had COVID-19 infection in the past and this feels different.  Past Medical History:  Diagnosis Date  . Hypertension 2003   dx age 89   . Wears glasses     Current Outpatient Medications on File Prior to Visit  Medication Sig Dispense Refill  . acetaminophen (TYLENOL 8 HOUR) 650 MG CR tablet Take 1 tablet (650 mg total) by mouth every 8 (eight) hours as needed for pain. 100 tablet 1  . amLODipine (NORVASC) 10 MG tablet Take 1 tablet (10 mg total) by mouth daily. 90 tablet 1  . carvedilol (COREG) 25 MG tablet Take 1 tablet (25 mg total) by mouth 2 (two) times daily with a meal. 180 tablet 1  . diclofenac Sodium (VOLTAREN) 1 % GEL Apply 2 g  topically 4 (four) times daily. 100 g 2  . ibuprofen (ADVIL) 800 MG tablet Take 1 tablet (800 mg total) by mouth every 8 (eight) hours as needed for moderate pain. 30 tablet 0  . methocarbamol (ROBAXIN) 500 MG tablet TAKE 1 TABLET (500 MG TOTAL) BY MOUTH EVERY 8 (EIGHT) HOURS AS NEEDED FOR MUSCLE SPASMS. 30 tablet 1  . spironolactone (ALDACTONE) 50 MG tablet Take 1 tablet (50 mg total) by mouth daily. 90 tablet 1  . traMADol (ULTRAM) 50 MG tablet Take 1 tablet (50 mg total) by mouth every 6 (six) hours as needed. 30 tablet 0   No current facility-administered medications on file prior to visit.    ROS: See HPI  Observations/Objective: Awake, alert, oriented x3 Not in acute distress Normal mood  Assessment and Plan: 1. Post-nasal drip Low suspicion for strept throat especially in the absence of fever and presence of a cough His symptoms are more in keeping with sinusitis versus seasonal allergies Counseled and educated on the use of nasal irrigation Conservative management at this point with no indication for antibiotics but advised that if symptoms persist beyond 5 to 7 days he needs to contact the clinic. We will place on Flonase and antihistamine. - fluticasone (FLONASE) 50 MCG/ACT nasal spray; Place 2 sprays into both nostrils daily.  Dispense: 16 g; Refill: 1 - cetirizine (ZYRTEC) 10 MG tablet; Take 1 tablet (10 mg total) by mouth daily.  Dispense: 30 tablet; Refill: 1   Follow  Up Instructions: Keep follow-up appointment with PCP.   I discussed the assessment and treatment plan with the patient. The patient was provided an opportunity to ask questions and all were answered. The patient agreed with the plan and demonstrated an understanding of the instructions.   The patient was advised to call back or seek an in-person evaluation if the symptoms worsen or if the condition fails to improve as anticipated.     I provided 11 minutes total of non-face-to-face time during this  encounter.   Charlott Rakes, MD, FAAFP. [facility] and [facility] [place], [place]   05/25/2020, 10:42 AM

## 2020-05-25 NOTE — Progress Notes (Signed)
difficulty swallowing. No fever.

## 2020-06-03 MED FILL — ?CARVEDILOL 25 MG TABLET: 25 | 30 days supply | Qty: 60 | Fill #2

## 2020-06-03 MED FILL — AMLODIPINE BESYLATE 10 MG T: 10 | 30 days supply | Qty: 30 | Fill #2

## 2020-06-03 MED FILL — ?CETIRIZINE HCL 10 MG TABLE: 10 | 30 days supply | Qty: 30 | Fill #0

## 2020-06-03 MED FILL — SPIRONOLACTONE 50 MG TABS: 50 | 30 days supply | Qty: 30 | Fill #3

## 2020-06-28 ENCOUNTER — Ambulatory Visit: Payer: Self-pay | Admitting: Internal Medicine

## 2020-07-18 ENCOUNTER — Other Ambulatory Visit: Payer: Self-pay

## 2020-07-18 ENCOUNTER — Other Ambulatory Visit: Payer: Self-pay | Admitting: Internal Medicine

## 2020-07-18 MED FILL — Carvedilol Tab 25 MG: ORAL | 30 days supply | Qty: 60 | Fill #0 | Status: CN

## 2020-07-18 MED FILL — Amlodipine Besylate Tab 10 MG (Base Equivalent): ORAL | 30 days supply | Qty: 30 | Fill #0 | Status: CN

## 2020-07-18 MED FILL — Spironolactone Tab 50 MG: ORAL | 30 days supply | Qty: 30 | Fill #0 | Status: CN

## 2020-07-18 NOTE — Telephone Encounter (Signed)
Requested medication (s) are due for refill today:  Provider to review  Requested medication (s) are on the active medication list:   No  Future visit scheduled:   Yes   Last ordered: 01/13/2020 #18, 0 refills  Returned because there is not a protocol assigned to this medication.   Requested Prescriptions  Pending Prescriptions Disp Refills   clindamycin (CLEOCIN) 150 MG capsule 18 capsule 0    Sig: TAKE 1 CAPSULE BY MOUTH THREE TIMES A DAY      Off-Protocol Failed - 07/18/2020  3:12 PM      Failed - Medication not assigned to a protocol, review manually.      Passed - Valid encounter within last 12 months    Recent Outpatient Visits           1 month ago Post-nasal drip   [place], Charlane Ferretti, MD   7 months ago Right thigh pain   [place], [place], NP   11 months ago Essential hypertension   [place], MD   1 year ago Lumbar radiculopathy, acute   [place], MD   1 year ago Syncope, near   [place]-Fela Solis, MD       Future Appointments             In 1 month [name], MD [place]

## 2020-07-18 NOTE — Telephone Encounter (Signed)
Medication Refill - Medication: clindamycin (CLEOCIN) 150 MG capsule [[id number]   Has the patient contacted their pharmacy? Yes.   (Agent: If no, request that the patient contact the pharmacy for the refill.) (Agent: If yes, when and what did the pharmacy advise?)  Preferred Pharmacy (with phone number or street name):  [address] [place] [address]  Phone: [phone] Fax: [phone]     Agent: Please be advised that RX refills may take up to 3 business days. We ask that you follow-up with your pharmacy.

## 2020-07-25 ENCOUNTER — Other Ambulatory Visit: Payer: Self-pay

## 2020-08-12 ENCOUNTER — Other Ambulatory Visit: Payer: Self-pay

## 2020-08-12 MED FILL — Amlodipine Besylate Tab 10 MG (Base Equivalent): ORAL | 30 days supply | Qty: 30 | Fill #0 | Status: CN

## 2020-08-12 MED FILL — Carvedilol Tab 25 MG: ORAL | 30 days supply | Qty: 60 | Fill #0 | Status: CN

## 2020-08-12 MED FILL — Spironolactone Tab 50 MG: ORAL | 30 days supply | Qty: 30 | Fill #0 | Status: CN

## 2020-08-25 ENCOUNTER — Ambulatory Visit: Payer: Self-pay | Admitting: Internal Medicine

## 2020-08-26 ENCOUNTER — Other Ambulatory Visit: Payer: Self-pay

## 2020-08-28 ENCOUNTER — Emergency Department (HOSPITAL_COMMUNITY)
Admission: EM | Admit: 2020-08-28 | Discharge: 2020-08-28 | Disposition: A | Discharge status: Home / Self Care | Payer: Self-pay | Attending: Emergency Medicine | Admitting: Emergency Medicine

## 2020-08-28 ENCOUNTER — Other Ambulatory Visit: Payer: Self-pay

## 2020-08-28 ENCOUNTER — Encounter (HOSPITAL_COMMUNITY): Payer: Self-pay

## 2020-08-28 ENCOUNTER — Emergency Department (HOSPITAL_COMMUNITY): Payer: Self-pay

## 2020-08-28 DIAGNOSIS — Z79899 Other long term (current) drug therapy: Secondary | ICD-10-CM | POA: Insufficient documentation

## 2020-08-28 DIAGNOSIS — R110 Nausea: Secondary | ICD-10-CM | POA: Insufficient documentation

## 2020-08-28 DIAGNOSIS — Z87891 Personal history of nicotine dependence: Secondary | ICD-10-CM | POA: Insufficient documentation

## 2020-08-28 DIAGNOSIS — I10 Essential (primary) hypertension: Secondary | ICD-10-CM | POA: Insufficient documentation

## 2020-08-28 LAB — CBC WITH DIFFERENTIAL/PLATELET
Abs Immature Granulocytes: 0.02 10*3/uL (ref 0.00–0.07)
Basophils Absolute: 0 10*3/uL (ref 0.0–0.1)
Basophils Relative: 1 %
Eosinophils Absolute: 0.1 10*3/uL (ref 0.0–0.5)
Eosinophils Relative: 1 %
HCT: 51 % (ref 39.0–52.0)
Hemoglobin: 17.4 g/dL — ABNORMAL HIGH (ref 13.0–17.0)
Immature Granulocytes: 0 %
Lymphocytes Relative: 12 %
Lymphs Abs: 0.9 10*3/uL (ref 0.7–4.0)
MCH: 30.2 pg (ref 26.0–34.0)
MCHC: 34.1 g/dL (ref 30.0–36.0)
MCV: 88.5 fL (ref 80.0–100.0)
Monocytes Absolute: 0.7 10*3/uL (ref 0.1–1.0)
Monocytes Relative: 11 %
Neutro Abs: 5.2 10*3/uL (ref 1.7–7.7)
Neutrophils Relative %: 75 %
Platelets: 250 10*3/uL (ref 150–400)
RBC: 5.76 MIL/uL (ref 4.22–5.81)
RDW: 13.5 % (ref 11.5–15.5)
WBC: 7 10*3/uL (ref 4.0–10.5)
nRBC: 0 % (ref 0.0–0.2)

## 2020-08-28 LAB — CBG MONITORING, ED: Glucose-Capillary: 107 mg/dL — ABNORMAL HIGH (ref 70–99)

## 2020-08-28 LAB — COMPREHENSIVE METABOLIC PANEL
ALT: 25 U/L (ref 0–44)
AST: 19 U/L (ref 15–41)
Albumin: 4.1 g/dL (ref 3.5–5.0)
Alkaline Phosphatase: 86 U/L (ref 38–126)
Anion gap: 10 (ref 5–15)
BUN: 10 mg/dL (ref 6–20)
CO2: 27 mmol/L (ref 22–32)
Calcium: 9.4 mg/dL (ref 8.9–10.3)
Chloride: 101 mmol/L (ref 98–111)
Creatinine, Ser: 1.21 mg/dL (ref 0.61–1.24)
GFR, Estimated: >60 mL/min (ref >60)
Glucose, Bld: 123 mg/dL — ABNORMAL HIGH (ref 70–99)
Potassium: 4.2 mmol/L (ref 3.5–5.1)
Sodium: 138 mmol/L (ref 135–145)
Total Bilirubin: 0.6 mg/dL (ref 0.3–1.2)
Total Protein: 8.5 g/dL — ABNORMAL HIGH (ref 6.5–8.1)

## 2020-08-28 MED ORDER — AMLODIPINE BESYLATE 5 MG PO TABS
10.0000 mg | ORAL_TABLET | Freq: Once | ORAL | Status: AC
  Administered 2020-08-28: 10 mg via ORAL
  Filled 2020-08-28: qty 2

## 2020-08-28 MED ORDER — SODIUM CHLORIDE 0.9 % IV BOLUS
1000.0000 mL | Freq: Once | INTRAVENOUS | Status: AC
  Administered 2020-08-28: 1000 mL via INTRAVENOUS

## 2020-08-28 MED ORDER — ACETAMINOPHEN 325 MG PO TABS
650.0000 mg | ORAL_TABLET | Freq: Once | ORAL | Status: AC
  Administered 2020-08-28: 650 mg via ORAL
  Filled 2020-08-28: qty 2

## 2020-08-28 MED ORDER — CARVEDILOL 12.5 MG PO TABS
25.0000 mg | ORAL_TABLET | Freq: Two times a day (BID) | ORAL | Status: DC
  Administered 2020-08-28: 25 mg via ORAL
  Filled 2020-08-28: qty 2

## 2020-08-28 NOTE — ED Provider Notes (Signed)
[facility] DEPT Provider Note   CSN: [id number] Arrival date & time: 08/28/20  1539     History Chief Complaint  Patient presents with   Hypertension   Nausea   Headache    [name] is a 45 y.o. male.  45 year old male presents to the ER with complaint of not feeling well today, nausea, body aches, extreme thirst and headache.  Patient states symptoms started this morning upon waking, checked his blood pressure and it was elevated, has been out of his blood pressure medication x3 days. Has run out of his amlodipine, carvedilol, spironolactone.  Did take 1 leftover clonidine.  BP with EMS of 194/109.  Denies chest pain, shortness of breath, unilateral weakness or numbness, lower extremity swelling.      Past Medical History:  Diagnosis Date   Hypertension 2003   dx age 38    Wears glasses     Patient Active Problem List   Diagnosis Date Noted   Syncope, near 03/20/2019   Pre-ulcerative corn or callous 02/01/2019   Disorder of left Achilles tendon 02/01/2019   Inguinal hernia of left side without obstruction or gangrene 12/08/2016   Tobacco use 12/08/2016   Localized swelling, mass and lump, multiple sites 12/08/2016   Subcutaneous nodules 07/09/2016   Acute left-sided thoracic back pain 02/16/2016   Left inguinal hernia 12/12/2015   Dental abscess 05/27/2015   Periodontitis 05/27/2015   Pain in left wrist 05/27/2015   HTN (hypertension) 05/12/2015    Past Surgical History:  Procedure Laterality Date   CHOLECYSTECTOMY         Family History  Problem Relation Age of Onset   Cancer Mother     Social History   Tobacco Use   Smoking status: Former    Packs/day: 1.00    Pack years: 0.00    Types: Cigars, Cigarettes    Quit date: 05/10/2015    Years since quitting: 5.3   Smokeless tobacco: Never  Vaping Use   Vaping Use: Never used  Substance Use Topics   Alcohol use: Yes    Comment: occ   Drug use: No    Home  Medications Prior to Admission medications   Medication Sig Start Date End Date Taking? Authorizing Provider  acetaminophen (TYLENOL 8 HOUR) 650 MG CR tablet Take 1 tablet (650 mg total) by mouth every 8 (eight) hours as needed for pain. 02/06/19   Ladell Pier, MD  amLODipine (NORVASC) 10 MG tablet TAKE 1 TABLET (10 MG TOTAL) BY MOUTH DAILY. Patient taking differently: Take 10 mg by mouth daily. 09/22/19 09/21/20  Ladell Pier, MD  carvedilol (COREG) 25 MG tablet TAKE 1 TABLET (25 MG TOTAL) BY MOUTH 2 (TWO) TIMES DAILY WITH A MEAL. 09/22/19 09/21/20  Ladell Pier, MD  cetirizine (ZYRTEC) 10 MG tablet TAKE 1 TABLET (10 MG TOTAL) BY MOUTH DAILY. 05/25/20 05/25/21  Charlott Rakes, MD  clindamycin (CLEOCIN) 150 MG capsule TAKE 1 CAPSULE BY MOUTH THREE TIMES A DAY 01/13/20 01/12/21    diclofenac Sodium (VOLTAREN) 1 % GEL Apply 2 g topically 4 (four) times daily. 02/06/19   Ladell Pier, MD  fluticasone (FLONASE) 50 MCG/ACT nasal spray PLACE 2 SPRAYS INTO BOTH NOSTRILS DAILY. 05/25/20 05/25/21  Charlott Rakes, MD  ibuprofen (ADVIL) 800 MG tablet Take 1 tablet (800 mg total) by mouth every 8 (eight) hours as needed for moderate pain. 06/08/19   Elsie Stain, MD  methocarbamol (ROBAXIN) 500 MG tablet TAKE  1 TABLET (500 MG TOTAL) BY MOUTH EVERY 8 (EIGHT) HOURS AS NEEDED FOR MUSCLE SPASMS. 11/03/19   Ladell Pier, MD  spironolactone (ALDACTONE) 50 MG tablet TAKE 1 TABLET (50 MG TOTAL) BY MOUTH DAILY. 09/22/19 09/21/20  Ladell Pier, MD  traMADol (ULTRAM) 50 MG tablet Take 1 tablet (50 mg total) by mouth every 6 (six) hours as needed. 07/27/19   Ladell Pier, MD    Allergies    Penicillins  Review of Systems   Review of Systems  Constitutional:  Negative for chills and fever.  HENT:  Negative for congestion.   Eyes:  Negative for visual disturbance.  Respiratory:  Negative for cough and shortness of breath.   Cardiovascular:  Negative for chest pain and leg swelling.   Gastrointestinal:  Positive for nausea and vomiting. Negative for abdominal pain, constipation and diarrhea.  Endocrine: Positive for polydipsia.  Genitourinary:  Negative for decreased urine volume and difficulty urinating.  Musculoskeletal:  Negative for arthralgias and myalgias.  Skin:  Negative for rash and wound.  Allergic/Immunologic: Negative for immunocompromised state.  Neurological:  Positive for headaches. Negative for speech difficulty and weakness.  Hematological:  Negative for adenopathy.  Psychiatric/Behavioral:  Negative for confusion.   All other systems reviewed and are negative.  Physical Exam Updated Vital Signs BP (!) 159/101   Pulse 72   Temp 98.3 F (36.8 C) (Oral)   Resp 17   Wt 100.7 kg   SpO2 99%   BMI 28.12 kg/m   Physical Exam Vitals and nursing note reviewed.  Constitutional:      General: He is not in acute distress.    Appearance: He is well-developed. He is not diaphoretic.  HENT:     Head: Normocephalic and atraumatic.     Mouth/Throat:     Mouth: Mucous membranes are dry.  Eyes:     Extraocular Movements: Extraocular movements intact.     Pupils: Pupils are equal, round, and reactive to light.  Cardiovascular:     Rate and Rhythm: Normal rate and regular rhythm.     Heart sounds: Normal heart sounds.  Pulmonary:     Effort: Pulmonary effort is normal.     Breath sounds: Normal breath sounds.  Abdominal:     Palpations: Abdomen is soft.     Tenderness: There is no abdominal tenderness.  Musculoskeletal:     Cervical back: Neck supple.     Right lower leg: No edema.     Left lower leg: No edema.  Skin:    General: Skin is warm and dry.     Findings: No erythema or rash.  Neurological:     Mental Status: He is alert and oriented to person, place, and time.     GCS: GCS eye subscore is 4. GCS verbal subscore is 5. GCS motor subscore is 6.     Cranial Nerves: No cranial nerve deficit.     Sensory: No sensory deficit.     Motor: No  weakness.  Psychiatric:        Behavior: Behavior normal.    ED Results / Procedures / Treatments   Labs (all labs ordered are listed, but only abnormal results are displayed) Labs Reviewed  COMPREHENSIVE METABOLIC PANEL - Abnormal; Notable for the following components:      Result Value   Glucose, Bld 123 (*)    Total Protein 8.5 (*)    All other components within normal limits  CBC WITH DIFFERENTIAL/PLATELET - Abnormal; Notable for  the following components:   Hemoglobin 17.4 (*)    All other components within normal limits  CBG MONITORING, ED - Abnormal; Notable for the following components:   Glucose-Capillary 107 (*)    All other components within normal limits  URINALYSIS, ROUTINE W REFLEX MICROSCOPIC    EKG None  Radiology DG Chest 2 View  Result Date: 08/28/2020 CLINICAL DATA:  Hypertension EXAM: CHEST - 2 VIEW COMPARISON:  02/11/2017 FINDINGS: Cardiac shadow is enlarged but stable. Tortuous thoracic aorta is seen. Poor inspiratory effort is noted. No focal infiltrate or effusion is seen however. Degenerative changes of the thoracic spine are noted. IMPRESSION: Poor inspiratory effort.  No acute abnormality seen. Electronically Signed   By: Inez Catalina M.D.   On: 08/28/2020 17:06    Procedures Procedures   Medications Ordered in ED Medications  carvedilol (COREG) tablet 25 mg (25 mg Oral Given 08/28/20 1604)  acetaminophen (TYLENOL) tablet 650 mg (has no administration in time range)  sodium chloride 0.9 % bolus 1,000 mL (0 mLs Intravenous Stopped 08/28/20 1749)  amLODipine (NORVASC) tablet 10 mg (10 mg Oral Given 08/28/20 1604)    ED Course  I have reviewed the triage vital signs and the nursing notes.  Pertinent labs & imaging results that were available during my care of the patient were reviewed by me and considered in my medical decision making (see chart for details).  Clinical Course as of 08/28/20 Pattricia Boss [birth year]  4772 45 year old male presents  with complaint of elevated blood pressure and feeling unwell today.  Patient states he ran out of his medications 3 days ago. On exam, well-appearing, abdomen soft and nontender. Patient is given Zofran for his nausea, IV fluids as well as a dose of his amlodipine and carvedilol. On recheck, patient continues to complain of being very thirsty however symptoms have overall improved. CBC and CBG unremarkable.  Urinalysis pending.  Chest x-ray unremarkable. [LM]  1736 UA canceled, normal Cr. EKG unchanged from prior. Patient has refills on his medications on file, recommend he take his medications and recheck with PCP, return to ER as needed. BP improved by time of dc to 159/101 [LM]    Clinical Course User Index [LM] Roque Lias   MDM Rules/Calculators/A&P                           Final Clinical Impression(s) / ED Diagnoses Final diagnoses:  Hypertension, unspecified type  Nausea    Rx / DC Orders ED Discharge Orders     None        Roque Lias 08/28/20 1858    Luna Fuse, MD 09/05/20 [phone]

## 2020-08-28 NOTE — Discharge Instructions (Addendum)
Your medications have 1 refill remaining. Go to your pharmacy to request medications.  Schedule follow up with your doctor for recheck. Return to ER for worsening or concerning symptoms.

## 2020-08-28 NOTE — ED Triage Notes (Signed)
Per EMS pt has been out of HTN meds since Thursday. Pt woke up this morning with n/v. Pt also reports a headache.  Pt states she gets dizzy when standing. Per EMS pt was clammy on their arrival. Pt also reports a cough and not being able to taste apple juice that started today.  Aox4, BP 194/109, HR 72, RR 18, O2 94%, CBG 117.

## 2020-08-30 ENCOUNTER — Ambulatory Visit (INDEPENDENT_AMBULATORY_CARE_PROVIDER_SITE_OTHER): Payer: Self-pay | Admitting: Nurse Practitioner

## 2020-08-30 ENCOUNTER — Other Ambulatory Visit: Payer: Self-pay

## 2020-08-30 VITALS — BP 165/110 | HR 72 | Temp 97.6°F | Resp 18

## 2020-08-30 DIAGNOSIS — I10 Essential (primary) hypertension: Secondary | ICD-10-CM

## 2020-08-30 MED FILL — Carvedilol Tab 25 MG: ORAL | 30 days supply | Qty: 60 | Fill #0 | Status: AC

## 2020-08-30 MED FILL — Amlodipine Besylate Tab 10 MG (Base Equivalent): ORAL | 30 days supply | Qty: 30 | Fill #0 | Status: AC

## 2020-08-30 MED FILL — Spironolactone Tab 50 MG: ORAL | 30 days supply | Qty: 30 | Fill #0 | Status: AC

## 2020-08-30 NOTE — Patient Instructions (Addendum)
Hypertension:  Will pick up medication today - and take it this afternoon  Will get tested for Covid - symptomatic today  Will come back by tomorrow to get BP rechecked    Follow up:  Follow up in 2 weeks - go straight to the ED if symptoms persist or worsen

## 2020-08-30 NOTE — Progress Notes (Signed)
@Patient  ID: [name], male    DOB: May 03, 1975, 45 y.o.   MRN: [id number]  Chief Complaint  Patient presents with   Hospitalization Follow-up     Referring provider: Ladell Pier, MD    HPI  Patient presents today for a transition of care visit/hospital follow-up.  Patient was seen in the ED on 08/28/2020.  He was seen for uncontrolled blood pressure.  He had ran out of his blood pressure medicines.  His blood pressure at the ED was 194/109.  They were able to get his blood pressure under control with IV medications.  Patient was discharged home with refills on blood pressure medications.  He states unfortunately he was unable to pick these up because of cost.  [facility] and [facility] will give him a free refill for these medications today.  Of course his blood pressure is elevated in office today because he has not been on his blood pressure medicines.  He states that he will pick up his medications today to start back on them and return tomorrow for repeat blood pressure check.  Overall patient is doing well since hospital discharge. Denies f/c/s, n/v/d, hemoptysis, PND, chest pain or edema.         Immunization History  Administered Date(s) Administered   Tdap 09/06/2016    Past Medical History:  Diagnosis Date   Hypertension 2003   dx age 6    Wears glasses     Tobacco History: Social History   Tobacco Use  Smoking Status Former   Packs/day: 1.00   Pack years: 0.00   Types: Cigars, Cigarettes   Quit date: 05/10/2015   Years since quitting: 5.3  Smokeless Tobacco Never   Counseling given: Yes   Outpatient Encounter Medications as of 08/30/2020  Medication Sig   acetaminophen (TYLENOL 8 HOUR) 650 MG CR tablet Take 1 tablet (650 mg total) by mouth every 8 (eight) hours as needed for pain.   amLODipine (NORVASC) 10 MG tablet TAKE 1 TABLET (10 MG TOTAL) BY MOUTH DAILY. (Patient taking differently: Take 10 mg by mouth  daily.)   carvedilol (COREG) 25 MG tablet TAKE 1 TABLET (25 MG TOTAL) BY MOUTH 2 (TWO) TIMES DAILY WITH A MEAL.   cetirizine (ZYRTEC) 10 MG tablet TAKE 1 TABLET (10 MG TOTAL) BY MOUTH DAILY.   clindamycin (CLEOCIN) 150 MG capsule TAKE 1 CAPSULE BY MOUTH THREE TIMES A DAY   diclofenac Sodium (VOLTAREN) 1 % GEL Apply 2 g topically 4 (four) times daily.   fluticasone (FLONASE) 50 MCG/ACT nasal spray PLACE 2 SPRAYS INTO BOTH NOSTRILS DAILY.   ibuprofen (ADVIL) 800 MG tablet Take 1 tablet (800 mg total) by mouth every 8 (eight) hours as needed for moderate pain.   methocarbamol (ROBAXIN) 500 MG tablet TAKE 1 TABLET (500 MG TOTAL) BY MOUTH EVERY 8 (EIGHT) HOURS AS NEEDED FOR MUSCLE SPASMS.   spironolactone (ALDACTONE) 50 MG tablet TAKE 1 TABLET (50 MG TOTAL) BY MOUTH DAILY.   traMADol (ULTRAM) 50 MG tablet Take 1 tablet (50 mg total) by mouth every 6 (six) hours as needed.   No facility-administered encounter medications on file as of 08/30/2020.     Review of Systems  Review of Systems  Constitutional: Negative.   HENT: Negative.    Respiratory:  Negative for cough and shortness of breath.   Cardiovascular: Negative.   Gastrointestinal: Negative.   Allergic/Immunologic: Negative.   Neurological: Negative.   Psychiatric/Behavioral: Negative.  Physical Exam  BP (!) 165/110   Pulse 72   Temp 97.6 F (36.4 C)   Resp 18   SpO2 100%   Wt Readings from Last 5 Encounters:  08/28/20 222 lb (100.7 kg)  11/26/19 220 lb 3.2 oz (99.9 kg)  07/27/19 220 lb 3.2 oz (99.9 kg)  01/29/19 211 lb 12.8 oz (96.1 kg)  05/15/18 218 lb 12.8 oz (99.2 kg)     Physical Exam Vitals and nursing note reviewed.  Constitutional:      General: He is not in acute distress.    Appearance: He is well-developed.  Cardiovascular:     Rate and Rhythm: Normal rate and regular rhythm.  Pulmonary:     Effort: Pulmonary effort is normal.     Breath sounds: Normal breath sounds.  Skin:    General: Skin is  warm and dry.  Neurological:     Mental Status: He is alert and oriented to person, place, and time.      Imaging: DG Chest 2 View  Result Date: 08/28/2020 CLINICAL DATA:  Hypertension EXAM: CHEST - 2 VIEW COMPARISON:  02/11/2017 FINDINGS: Cardiac shadow is enlarged but stable. Tortuous thoracic aorta is seen. Poor inspiratory effort is noted. No focal infiltrate or effusion is seen however. Degenerative changes of the thoracic spine are noted. IMPRESSION: Poor inspiratory effort.  No acute abnormality seen. Electronically Signed   By: Inez Catalina M.D.   On: 08/28/2020 17:06     Assessment & Plan:   Essential hypertension Will pick up medication today - and take it this afternoon  Will get tested for Covid - symptomatic today  Will come back by tomorrow to get BP rechecked    Follow up:  Follow up in 2 weeks - go straight to the ED if symptoms persist or worsen     Fenton Foy, NP 09/08/2020

## 2020-08-31 ENCOUNTER — Telehealth: Payer: Self-pay

## 2020-08-31 NOTE — Telephone Encounter (Signed)
At request of Lazaro Arms, NP attempted to contact the patient to schedule him with Benard Halsted, RPH to discuss HTN medications.  Call placed to # [phone] and the phone just rings, no option for voicemail.  Call placed to # [phone], message left with call back requested

## 2020-09-01 NOTE — Telephone Encounter (Signed)
Call placed to patient and scheduled him to meet with Benard Halsted, [facility] 09/09/2020 @ 1000

## 2020-09-08 DIAGNOSIS — I10 Essential (primary) hypertension: Secondary | ICD-10-CM | POA: Insufficient documentation

## 2020-09-08 NOTE — Assessment & Plan Note (Signed)
Will pick up medication today - and take it this afternoon  Will get tested for Covid - symptomatic today  Will come back by tomorrow to get BP rechecked    Follow up:  Follow up in 2 weeks - go straight to the ED if symptoms persist or worsen

## 2020-09-09 ENCOUNTER — Ambulatory Visit: Payer: Self-pay | Admitting: Pharmacist

## 2020-09-23 ENCOUNTER — Ambulatory Visit: Payer: Self-pay | Admitting: Pharmacist

## 2020-10-13 ENCOUNTER — Other Ambulatory Visit: Payer: Self-pay

## 2020-10-13 ENCOUNTER — Other Ambulatory Visit: Payer: Self-pay | Admitting: Internal Medicine

## 2020-10-13 DIAGNOSIS — I10 Essential (primary) hypertension: Secondary | ICD-10-CM

## 2020-10-14 ENCOUNTER — Other Ambulatory Visit: Payer: Self-pay | Admitting: Pharmacist

## 2020-10-14 ENCOUNTER — Other Ambulatory Visit: Payer: Self-pay

## 2020-10-14 DIAGNOSIS — I10 Essential (primary) hypertension: Secondary | ICD-10-CM

## 2020-10-14 MED ORDER — SPIRONOLACTONE 50 MG PO TABS
50.0000 mg | ORAL_TABLET | Freq: Every day | ORAL | 0 refills | Status: DC
  Filled 2020-10-14: qty 30, 30d supply, fill #0

## 2020-10-14 MED ORDER — CARVEDILOL 25 MG PO TABS
ORAL_TABLET | Freq: Two times a day (BID) | ORAL | 0 refills | Status: DC
  Filled 2020-10-14: qty 60, 30d supply, fill #0

## 2020-10-14 MED ORDER — AMLODIPINE BESYLATE 10 MG PO TABS
10.0000 mg | ORAL_TABLET | Freq: Every day | ORAL | 0 refills | Status: DC
  Filled 2020-10-14: qty 30, 30d supply, fill #0

## 2020-10-19 ENCOUNTER — Other Ambulatory Visit: Payer: Self-pay

## 2020-10-19 ENCOUNTER — Ambulatory Visit: Payer: Self-pay | Attending: Physician Assistant | Admitting: Physician Assistant

## 2020-10-19 VITALS — BP 159/111 | HR 67 | Ht 74.0 in | Wt 223.6 lb

## 2020-10-19 DIAGNOSIS — R739 Hyperglycemia, unspecified: Secondary | ICD-10-CM

## 2020-10-19 DIAGNOSIS — G8929 Other chronic pain: Secondary | ICD-10-CM

## 2020-10-19 DIAGNOSIS — R5383 Other fatigue: Secondary | ICD-10-CM

## 2020-10-19 DIAGNOSIS — R0982 Postnasal drip: Secondary | ICD-10-CM

## 2020-10-19 DIAGNOSIS — J339 Nasal polyp, unspecified: Secondary | ICD-10-CM

## 2020-10-19 DIAGNOSIS — I10 Essential (primary) hypertension: Secondary | ICD-10-CM

## 2020-10-19 DIAGNOSIS — Z13220 Encounter for screening for lipoid disorders: Secondary | ICD-10-CM

## 2020-10-19 DIAGNOSIS — M25511 Pain in right shoulder: Secondary | ICD-10-CM

## 2020-10-19 MED ORDER — AMLODIPINE BESYLATE 10 MG PO TABS
10.0000 mg | ORAL_TABLET | Freq: Every day | ORAL | 4 refills | Status: DC
  Filled 2020-10-19 – 2020-12-22 (×2): qty 30, 30d supply, fill #0
  Filled 2020-12-30 – 2021-01-27 (×2): qty 90, 90d supply, fill #1
  Filled 2021-02-16: qty 30, 30d supply, fill #1
  Filled 2021-04-13 – 2021-04-28 (×2): qty 30, 30d supply, fill #0
  Filled 2021-06-05: qty 30, 30d supply, fill #1

## 2020-10-19 MED ORDER — SPIRONOLACTONE 50 MG PO TABS
50.0000 mg | ORAL_TABLET | Freq: Every day | ORAL | 4 refills | Status: DC
  Filled 2020-10-19 – 2020-12-22 (×2): qty 30, 30d supply, fill #0
  Filled 2020-12-30: qty 90, 90d supply, fill #1
  Filled 2021-01-27 – 2021-02-16 (×2): qty 30, 30d supply, fill #1
  Filled 2021-04-13 – 2021-04-28 (×2): qty 30, 30d supply, fill #0
  Filled 2021-06-05: qty 30, 30d supply, fill #1

## 2020-10-19 MED ORDER — FLUTICASONE PROPIONATE 50 MCG/ACT NA SUSP
2.0000 | Freq: Every day | NASAL | 1 refills | Status: DC
  Filled 2020-10-19: qty 16, 30d supply, fill #0

## 2020-10-19 MED ORDER — CARVEDILOL 25 MG PO TABS
ORAL_TABLET | Freq: Two times a day (BID) | ORAL | 4 refills | Status: DC
  Filled 2020-10-19: qty 60, fill #0
  Filled 2020-12-22: qty 60, 30d supply, fill #0
  Filled 2020-12-30: qty 180, 90d supply, fill #1
  Filled 2021-01-27 – 2021-02-16 (×2): qty 60, 30d supply, fill #1
  Filled 2021-04-13 – 2021-04-28 (×2): qty 60, 30d supply, fill #0
  Filled 2021-06-05: qty 60, 30d supply, fill #1

## 2020-10-19 NOTE — Progress Notes (Signed)
Patient ID: [name], male   DOB: 04-09-1975, 45 y.o.   MRN: [id number]   [name], is a 45 y.o. male  [id number]  [id number]  DOB - 08/16/1975  Chief Complaint  Patient presents with   Hypertension       Subjective:   [name] is a 45 y.o. male here today for a follow up visit and to establish care. Patient has No headache, No chest pain, No abdominal pain - No Nausea, No new weakness tingling or numbness, No Cough - SOB.  Needs med RF.  Out of meds.  Denies HA/Dizziness.  He would like to see a cardiologist to check for "clogged arteries."    He is feeling fatigued.  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   He is c/o R shoulder pain.  Previously diagnosed with spurs on the shoulder.    He also wants to see an ENT due to a h/o nasal polyps and chronic congestion  No problems updated.  ALLERGIES: PCN  PAST MEDICAL HISTORY: Past Medical History:  Diagnosis Date   Hypertension 2003   dx age 50    Wears glasses     MEDICATIONS AT HOME: Prior to Admission medications   Medication Sig Start Date End Date Taking? Authorizing Provider  methocarbamol (ROBAXIN) 500 MG tablet TAKE 1 TABLET (500 MG TOTAL) BY MOUTH EVERY 8 (EIGHT) HOURS AS NEEDED FOR MUSCLE SPASMS. 11/03/19  Yes Ladell Pier, MD  acetaminophen (TYLENOL 8 HOUR) 650 MG CR tablet Take 1 tablet (650 mg total) by mouth every 8 (eight) hours as needed for pain. Patient not taking: Reported on 10/19/2020 02/06/19   Ladell Pier, MD  amLODipine (NORVASC) 10 MG tablet Take 1 tablet (10 mg total) by mouth daily. 10/19/20 10/19/21  Argentina Donovan, PA-C  carvedilol (COREG) 25 MG tablet TAKE 1 TABLET (25 MG TOTAL) BY MOUTH 2 (TWO) TIMES DAILY WITH A MEAL. 10/19/20 10/19/21  Argentina Donovan, PA-C  cetirizine (ZYRTEC) 10 MG tablet TAKE 1 TABLET (10 MG TOTAL) BY MOUTH DAILY. Patient not taking: Reported on 10/19/2020 05/25/20 05/25/21  Charlott Rakes, MD  clindamycin (CLEOCIN) 150 MG capsule  TAKE 1 CAPSULE BY MOUTH THREE TIMES A DAY Patient not taking: Reported on 10/19/2020 01/13/20 01/12/21    diclofenac Sodium (VOLTAREN) 1 % GEL Apply 2 g topically 4 (four) times daily. Patient not taking: Reported on 10/19/2020 02/06/19   Ladell Pier, MD  fluticasone [facility]) 50 MCG/ACT nasal spray PLACE 2 SPRAYS INTO BOTH NOSTRILS DAILY. Patient not taking: Reported on 10/19/2020 05/25/20 05/25/21  Charlott Rakes, MD  ibuprofen (ADVIL) 800 MG tablet Take 1 tablet (800 mg total) by mouth every 8 (eight) hours as needed for moderate pain. Patient not taking: Reported on 10/19/2020 06/08/19   Elsie Stain, MD  spironolactone (ALDACTONE) 50 MG tablet Take 1 tablet (50 mg total) by mouth daily. 10/19/20 10/19/21  Argentina Donovan, PA-C  traMADol (ULTRAM) 50 MG tablet Take 1 tablet (50 mg total) by mouth every 6 (six) hours as needed. Patient not taking: Reported on 10/19/2020 07/27/19   Ladell Pier, MD    ROS: Neg HEENT other than stated above Neg resp Neg CP/palpitations, radiating pain/paresthesias Neg GI Neg GU Neg MS Neg psych Neg neuro  Objective:   Vitals:   10/19/20 1110  BP: (!) 159/111  Pulse: 67  SpO2: 98%  Weight: 223 lb 9.6 oz (101.4 kg)  Height: '6\' 2"'$  (1.88 m)   Exam  General appearance : Awake, alert, not in any distress. Speech Clear. Not toxic looking HEENT: Atraumatic and Normocephalic, pupils equally reactive to light and accomodation Neck: Supple, no JVD. No cervical lymphadenopathy.  Chest: Good air entry bilaterally, no added sounds  CVS: S1 S2 regular, no murmurs.  Extremities: B/L Lower Ext shows no edema, both legs are warm to touch Neurology: Awake alert, and oriented X 3, CN II-XII intact, Non focal Skin: No Rash  Data Review No results found for: HGBA1C  Assessment & Plan   1. Essential hypertension Uncontrolled-dangerously high-he will get and resume all meds today - carvedilol (COREG) 25 MG tablet; TAKE 1 TABLET (25 MG TOTAL) BY MOUTH  2 (TWO) TIMES DAILY WITH A MEAL.  Dispense: 60 tablet; Refill: 4 - spironolactone (ALDACTONE) 50 MG tablet; Take 1 tablet (50 mg total) by mouth daily.  Dispense: 30 tablet; Refill: 4 - amLODipine (NORVASC) 10 MG tablet; Take 1 tablet (10 mg total) by mouth daily.  Dispense: 30 tablet; Refill: 4 - Ambulatory referral to Cardiology  2. Chronic right shoulder pain - Ambulatory referral to Orthopedic Surgery  3. Hyperglycemia I have had a lengthy discussion and provided education about insulin resistance and the intake of too much sugar/refined carbohydrates.  I have advised the patient to work at a goal of eliminating sugary drinks, candy, desserts, sweets, refined sugars, processed foods, and white carbohydrates.  The patient expresses understanding.   - Hemoglobin A1c - Ambulatory referral to Cardiology  4. Other fatigue - TSH - Vitamin D, 25-hydroxy - Ambulatory referral to Cardiology  5. Nasal polyps - Ambulatory referral to ENT  6. Screening cholesterol level - Lipid panel  Patient have been counseled extensively about nutrition and exercise. Other issues discussed during this visit include: low cholesterol diet, weight control and daily exercise, foot care, annual eye examinations at Ophthalmology, importance of adherence with medications and regular follow-up. We also discussed long term complications of uncontrolled diabetes and hypertension.   Return for 3 weeks with Lurena Joiner and 3 months with PCP.  The patient was given clear instructions to go to ER or return to medical center if symptoms don't improve, worsen or new problems develop. The patient verbalized understanding. The patient was told to call to get lab results if they haven't heard anything in the next week.      Freeman Caldron, PA-C [facility] and [facility] [place], [place]   10/19/2020, 11:44 AM

## 2020-10-19 NOTE — Progress Notes (Signed)
Having pain in right shoulder.

## 2020-10-19 NOTE — Patient Instructions (Signed)
Check blood pressure 3 to 5 times weekly and record and have available for next visit.    Resume all meds.  Stop smoking. Drink 80-100 ounces water daily Hypertension, Adult High blood pressure (hypertension) is when the force of blood pumping through the arteries is too strong. The arteries are the blood vessels that carry blood from the heart throughout the body. Hypertension forces the heart to work harder to pump blood and may cause arteries to become narrow or stiff. Untreated or uncontrolled hypertension can cause a heart attack, heart failure, a stroke, kidney disease, and otherproblems. A blood pressure reading consists of a higher number over a lower number. Ideally, your blood pressure should be below 120/80. The first ("top") number is called the systolic pressure. It is a measure of the pressure in your arteries as your heart beats. The second ("bottom") number is called the diastolic pressure. It is a measure of the pressure in your arteries as theheart relaxes. What are the causes? The exact cause of this condition is not known. There are some conditions thatresult in or are related to high blood pressure. What increases the risk? Some risk factors for high blood pressure are under your control. The following factors may make you more likely to develop this condition: Smoking. Having type 2 diabetes mellitus, high cholesterol, or both. Not getting enough exercise or physical activity. Being overweight. Having too much fat, sugar, calories, or salt (sodium) in your diet. Drinking too much alcohol. Some risk factors for high blood pressure may be difficult or impossible to change. Some of these factors include: Having chronic kidney disease. Having a family history of high blood pressure. Age. Risk increases with age. Race. You may be at higher risk if you are African American. Gender. Men are at higher risk than women before age 73. After age 73, women are at higher risk than  men. Having obstructive sleep apnea. Stress. What are the signs or symptoms? High blood pressure may not cause symptoms. Very high blood pressure (hypertensive crisis) may cause: Headache. Anxiety. Shortness of breath. Nosebleed. Nausea and vomiting. Vision changes. Severe chest pain. Seizures. How is this diagnosed? This condition is diagnosed by measuring your blood pressure while you are seated, with your arm resting on a flat surface, your legs uncrossed, and your feet flat on the floor. The cuff of the blood pressure monitor will be placed directly against the skin of your upper arm at the level of your heart. It should be measured at least twice using the same arm. Certain conditions cancause a difference in blood pressure between your right and left arms. Certain factors can cause blood pressure readings to be lower or higher than normal for a short period of time: When your blood pressure is higher when you are in a health care provider's office than when you are at home, this is called white coat hypertension. Most people with this condition do not need medicines. When your blood pressure is higher at home than when you are in a health care provider's office, this is called masked hypertension. Most people with this condition may need medicines to control blood pressure. If you have a high blood pressure reading during one visit or you have normal blood pressure with other risk factors, you may be asked to: Return on a different day to have your blood pressure checked again. Monitor your blood pressure at home for 1 week or longer. If you are diagnosed with hypertension, you may have other blood  or imaging tests to help your health care provider understand your overall risk for otherconditions. How is this treated? This condition is treated by making healthy lifestyle changes, such as eating healthy foods, exercising more, and reducing your alcohol intake. Your health care provider  may prescribe medicine if lifestyle changes are not enough to get your blood pressure under control, and if: Your systolic blood pressure is above 130. Your diastolic blood pressure is above 80. Your personal target blood pressure may vary depending on your medicalconditions, your age, and other factors. Follow these instructions at home: Eating and drinking  Eat a diet that is high in fiber and potassium, and low in sodium, added sugar, and fat. An example eating plan is called the DASH (Dietary Approaches to Stop Hypertension) diet. To eat this way: Eat plenty of fresh fruits and vegetables. Try to fill one half of your plate at each meal with fruits and vegetables. Eat whole grains, such as whole-wheat pasta, brown rice, or whole-grain bread. Fill about one fourth of your plate with whole grains. Eat or drink low-fat dairy products, such as skim milk or low-fat yogurt. Avoid fatty cuts of meat, processed or cured meats, and poultry with skin. Fill about one fourth of your plate with lean proteins, such as fish, chicken without skin, beans, eggs, or tofu. Avoid pre-made and processed foods. These tend to be higher in sodium, added sugar, and fat. Reduce your daily sodium intake. Most people with hypertension should eat less than 1,500 mg of sodium a day. Do not drink alcohol if: Your health care provider tells you not to drink. You are pregnant, may be pregnant, or are planning to become pregnant. If you drink alcohol: Limit how much you use to: 0-1 drink a day for women. 0-2 drinks a day for men. Be aware of how much alcohol is in your drink. In the [place] one drink equals one 12 oz bottle of beer (355 mL), one 5 oz glass of wine (148 mL), or one 1 oz glass of hard liquor (44 mL).  Lifestyle  Work with your health care provider to maintain a healthy body weight or to lose weight. Ask what an ideal weight is for you. Get at least 30 minutes of exercise most days of the week. Activities  may include walking, swimming, or biking. Include exercise to strengthen your muscles (resistance exercise), such as Pilates or lifting weights, as part of your weekly exercise routine. Try to do these types of exercises for 30 minutes at least 3 days a week. Do not use any products that contain nicotine or tobacco, such as cigarettes, e-cigarettes, and chewing tobacco. If you need help quitting, ask your health care provider. Monitor your blood pressure at home as told by your health care provider. Keep all follow-up visits as told by your health care provider. This is important.  Medicines Take over-the-counter and prescription medicines only as told by your health care provider. Follow directions carefully. Blood pressure medicines must be taken as prescribed. Do not skip doses of blood pressure medicine. Doing this puts you at risk for problems and can make the medicine less effective. Ask your health care provider about side effects or reactions to medicines that you should watch for. Contact a health care provider if you: Think you are having a reaction to a medicine you are taking. Have headaches that keep coming back (recurring). Feel dizzy. Have swelling in your ankles. Have trouble with your vision. Get help right away if  you: Develop a severe headache or confusion. Have unusual weakness or numbness. Feel faint. Have severe pain in your chest or abdomen. Vomit repeatedly. Have trouble breathing. Summary Hypertension is when the force of blood pumping through your arteries is too strong. If this condition is not controlled, it may put you at risk for serious complications. Your personal target blood pressure may vary depending on your medical conditions, your age, and other factors. For most people, a normal blood pressure is less than 120/80. Hypertension is treated with lifestyle changes, medicines, or a combination of both. Lifestyle changes include losing weight, eating a  healthy, low-sodium diet, exercising more, and limiting alcohol. This information is not intended to replace advice given to you by your health care provider. Make sure you discuss any questions you have with your healthcare provider. Document Revised: 11/06/2017 Document Reviewed: 11/06/2017 Elsevier Patient Education  [place].

## 2020-10-20 LAB — LIPID PANEL
Chol/HDL Ratio: 3.3 ratio (ref 0.0–5.0)
Cholesterol, Total: 129 mg/dL (ref 100–199)
HDL: 39 mg/dL — ABNORMAL LOW
LDL Chol Calc (NIH): 69 mg/dL (ref 0–99)
Triglycerides: 114 mg/dL (ref 0–149)
VLDL Cholesterol Cal: 21 mg/dL (ref 5–40)

## 2020-10-20 LAB — HEMOGLOBIN A1C
Est. average glucose Bld gHb Est-mCnc: 131 mg/dL
Hgb A1c MFr Bld: 6.2 % — ABNORMAL HIGH (ref 4.8–5.6)

## 2020-10-20 LAB — VITAMIN D 25 HYDROXY (VIT D DEFICIENCY, FRACTURES): Vit D, 25-Hydroxy: 26.9 ng/mL — ABNORMAL LOW (ref 30.0–100.0)

## 2020-10-20 LAB — TSH: TSH: 0.475 u[IU]/mL (ref 0.450–4.500)

## 2020-11-10 ENCOUNTER — Ambulatory Visit: Payer: Self-pay | Attending: Internal Medicine | Admitting: Pharmacist

## 2020-11-10 ENCOUNTER — Other Ambulatory Visit: Payer: Self-pay

## 2020-11-10 ENCOUNTER — Encounter: Payer: Self-pay | Admitting: Pharmacist

## 2020-11-10 VITALS — BP 151/95

## 2020-11-10 DIAGNOSIS — I10 Essential (primary) hypertension: Secondary | ICD-10-CM

## 2020-11-10 MED ORDER — LOSARTAN POTASSIUM 25 MG PO TABS
25.0000 mg | ORAL_TABLET | Freq: Every day | ORAL | 2 refills | Status: DC
  Filled 2020-11-10: qty 30, 30d supply, fill #0
  Filled 2020-12-30: qty 90, 90d supply, fill #0
  Filled 2021-01-20: qty 30, 30d supply, fill #0
  Filled 2021-02-16: qty 30, 30d supply, fill #1
  Filled 2021-04-13 – 2021-04-28 (×2): qty 30, 30d supply, fill #0

## 2020-11-10 NOTE — Progress Notes (Signed)
   S:   PCP: Dr. Wynetta Emery   Patient in good spirits. He was last seen by Freeman Caldron 10/19/2020. BP was 159/111 mmHg secondary to medication non-compliance.  Patient reports adherence with medications. Denies chest pain, dyspnea, or blurred vision.   Current BP Medications include:  Amlodipine 10 mg daily, spironolactone 50 mg daily, carvedilol 25 mg BID   Dietary habits include: limits salt but does eat fast food frequently; drinks 2-3 cans of soda a day Exercise habits include: 20 minutes a day, 2x a week Family / Social history: no pertinent positives; current smoker of 1 cigar a week, occasional alcohol use  Home BP readings: does not have any readings with him today  O:  Vitals:   11/10/20 1557  BP: (!) 151/95   Last 3 Office BP readings: BP Readings from Last 3 Encounters:  11/10/20 (!) 151/95  10/19/20 (!) 159/111  08/30/20 (!) 165/110   BMET    Component Value Date/Time   NA 138 08/28/2020 1557   NA 140 01/29/2019 1044   K 4.2 08/28/2020 1557   CL 101 08/28/2020 1557   CO2 27 08/28/2020 1557   GLUCOSE 123 (H) 08/28/2020 1557   BUN 10 08/28/2020 1557   BUN 10 01/29/2019 1044   CREATININE 1.21 08/28/2020 1557   CREATININE 0.97 12/12/2015 1306   CALCIUM 9.4 08/28/2020 1557   GFRNONAA >60 08/28/2020 1557   GFRNONAA >89 12/12/2015 1306   GFRAA 91 01/29/2019 1044   GFRAA >89 12/12/2015 1306   Renal function: CrCl cannot be calculated (Patient's most recent lab result is older than the maximum 21 days allowed.).  Clinical ASCVD: No  The ASCVD Risk score Mikey Bussing DC Jr., et al., 2013) failed to calculate for the following reasons:   The valid total cholesterol range is 130 to 320 mg/dL  A/P: Hypertension longstanding currently uncontrolled on current medications. BP Goal <130/80 mmHg. Patient is adherent with current medications. He is on spironolactone. Last K was 4.2. Will add low dose losartan and have him return in 1 week for lab work. He has tried HCTZ and  lisinopril before and does not wish to add these back at this time.   -Continued current regimen. -Add losartan 25 mg daily.  -Counseled on lifestyle modifications for blood pressure control including reduced dietary sodium, increased exercise, adequate sleep  Results reviewed and written information provided. Total time in face-to-face counseling 15 minutes.   F/U lab visit in 1 week. Me in 3 weeks.   Benard Halsted, PharmD, [place], [place] [phone]

## 2020-11-17 ENCOUNTER — Other Ambulatory Visit: Payer: Self-pay

## 2020-11-17 ENCOUNTER — Ambulatory Visit: Payer: Self-pay | Attending: Internal Medicine

## 2020-11-17 DIAGNOSIS — I10 Essential (primary) hypertension: Secondary | ICD-10-CM

## 2020-11-18 LAB — BASIC METABOLIC PANEL
BUN/Creatinine Ratio: 14 (ref 9–20)
BUN: 13 mg/dL (ref 6–24)
CO2: 23 mmol/L (ref 20–29)
Calcium: 9.1 mg/dL (ref 8.7–10.2)
Chloride: 102 mmol/L (ref 96–106)
Creatinine, Ser: 0.93 mg/dL (ref 0.76–1.27)
Glucose: 126 mg/dL — ABNORMAL HIGH (ref 65–99)
Potassium: 4 mmol/L (ref 3.5–5.2)
Sodium: 139 mmol/L (ref 134–144)
eGFR: 103 mL/min/{1.73_m2} (ref >59)

## 2020-11-22 ENCOUNTER — Other Ambulatory Visit: Payer: Self-pay

## 2020-11-22 ENCOUNTER — Telehealth: Payer: Self-pay | Admitting: Internal Medicine

## 2020-11-22 ENCOUNTER — Telehealth: Payer: Self-pay

## 2020-11-22 MED ORDER — CLINDAMYCIN HCL 150 MG PO CAPS
ORAL_CAPSULE | ORAL | 0 refills | Status: DC
  Filled 2020-11-22: qty 21, 7d supply, fill #0

## 2020-11-22 NOTE — Telephone Encounter (Signed)
Pt requesting urgent  Rx #: [id number]  clindamycin (CLEOCIN) 150 MG capsule [id number]   [facility]

## 2020-11-22 NOTE — Telephone Encounter (Signed)
Contacted pt to go over lab results pt is aware of results. Pt also stated that he needs antibiotics.

## 2020-11-22 NOTE — Telephone Encounter (Signed)
Patient has already picked up medication.

## 2020-11-22 NOTE — Telephone Encounter (Signed)
-----   Message from Ladell Pier, MD sent at 11/18/2020  7:40 AM EDT ----- Kidney function looks good.

## 2020-11-25 ENCOUNTER — Other Ambulatory Visit: Payer: Self-pay

## 2020-11-25 ENCOUNTER — Encounter: Payer: Self-pay | Admitting: Internal Medicine

## 2020-11-25 ENCOUNTER — Ambulatory Visit: Payer: Self-pay | Attending: Internal Medicine | Admitting: Internal Medicine

## 2020-11-25 ENCOUNTER — Ambulatory Visit: Payer: Self-pay | Admitting: Internal Medicine

## 2020-11-25 VITALS — BP 149/96 | HR 68 | Resp 16 | Wt 216.6 lb

## 2020-11-25 DIAGNOSIS — I10 Essential (primary) hypertension: Secondary | ICD-10-CM

## 2020-11-25 DIAGNOSIS — M545 Low back pain, unspecified: Secondary | ICD-10-CM

## 2020-11-25 DIAGNOSIS — M542 Cervicalgia: Secondary | ICD-10-CM

## 2020-11-25 DIAGNOSIS — K029 Dental caries, unspecified: Secondary | ICD-10-CM

## 2020-11-25 MED ORDER — DICLOFENAC SODIUM 1 % EX GEL
2.0000 g | Freq: Four times a day (QID) | CUTANEOUS | 2 refills | Status: DC
  Filled 2020-11-25: qty 100, 12d supply, fill #0

## 2020-11-25 MED ORDER — METHOCARBAMOL 500 MG PO TABS
500.0000 mg | ORAL_TABLET | Freq: Three times a day (TID) | ORAL | 1 refills | Status: DC | PRN
  Filled 2020-11-25: qty 30, 10d supply, fill #0
  Filled 2021-02-16: qty 30, 10d supply, fill #1

## 2020-11-25 MED ORDER — TRAMADOL HCL 50 MG PO TABS
50.0000 mg | ORAL_TABLET | Freq: Four times a day (QID) | ORAL | 0 refills | Status: DC | PRN
  Filled 2020-11-25: qty 15, 4d supply, fill #0

## 2020-11-25 NOTE — Progress Notes (Signed)
Patient has some questions about pain he is having.

## 2020-11-25 NOTE — Progress Notes (Signed)
Patient ID: [name], male    DOB: 08-15-1975  MRN: [id number]  CC: Hypertension and Follow-up   Subjective: [name] is a 45 y.o. male who presents for chronic ds management His concerns today include:  Hx of poorly HTN, tob dep, poor dentition, chronic lower back pain, history of reflex syncope, callus of left Achilles tendon.  C/o dental pain x4 days. Swelling of gum in RT lower jaw and pain when he eats.  He called and requested some abx a few days ago.  Rxn sent to pharmacy for [place].  He is currently on 2nd day of [place] No insurance and working part-time  Has dentist Dr. Quincy Simmonds.  Last seen 6 mths ago.  Can afford to go back.  He does not have OC and wonders if it will do any good to get it as he needs extractions not just routine dental care  A lot of chronic body pain.  At this time in is in his posterior neck Flare of neck pain over past few wks.  Drives a forklift at work.  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   Cozaar 25 mg added on last visit with clinical pharmacist on 11/10/20. Pt has not pick up the Cozaar as yet Patient Active Problem List   Diagnosis Date Noted   Essential hypertension 09/08/2020   Syncope, near 03/20/2019   Pre-ulcerative corn or callous 02/01/2019   Disorder of left Achilles tendon 02/01/2019   Inguinal hernia of left side without obstruction or gangrene 12/08/2016   Tobacco use 12/08/2016   Localized swelling, mass and lump, multiple sites 12/08/2016   Subcutaneous nodules 07/09/2016   Acute left-sided thoracic back pain 02/16/2016   Left inguinal hernia 12/12/2015   Dental abscess 05/27/2015   Periodontitis 05/27/2015   Pain in left wrist 05/27/2015   HTN (hypertension) 05/12/2015     Current Outpatient Medications on File Prior to Visit  Medication Sig Dispense Refill   acetaminophen (TYLENOL 8  HOUR) 650 MG CR tablet Take 1 tablet (650 mg total) by mouth every 8 (eight) hours as needed for pain. 100 tablet 1   amLODipine (NORVASC) 10 MG tablet Take 1 tablet (10 mg total) by mouth daily. 30 tablet 0   amLODipine (NORVASC) 10 MG tablet Take 1 tablet (10 mg total) by mouth daily. 30 tablet 4   carvedilol (COREG) 25 MG tablet TAKE 1 TABLET (25 MG TOTAL) BY MOUTH 2 (TWO) TIMES DAILY WITH A MEAL. 60 tablet 0   carvedilol (COREG) 25 MG tablet TAKE 1 TABLET (25 MG TOTAL) BY MOUTH 2 (TWO) TIMES DAILY WITH A MEAL. 60 tablet 4   cetirizine (ZYRTEC) 10 MG tablet TAKE 1 TABLET (10 MG TOTAL) BY MOUTH DAILY. 30 tablet 1   clindamycin (CLEOCIN) 150 MG capsule TAKE 1 CAPSULE BY MOUTH THREE TIMES A DAY 21 capsule 0   diclofenac Sodium (VOLTAREN) 1 % GEL Apply 2 g topically 4 (four) times daily. 100 g 2   fluticasone (FLONASE) 50 MCG/ACT nasal spray PLACE 2 SPRAYS INTO BOTH NOSTRILS DAILY. 16 g 1   ibuprofen (ADVIL) 800 MG tablet Take 1 tablet (800 mg total) by mouth every 8 (eight) hours as needed for moderate pain. 30 tablet 0   losartan (COZAAR) 25 MG tablet Take 1 tablet (25 mg total) by mouth daily. 30 tablet 2  methocarbamol (ROBAXIN) 500 MG tablet TAKE 1 TABLET (500 MG TOTAL) BY MOUTH EVERY 8 (EIGHT) HOURS AS NEEDED FOR MUSCLE SPASMS. 30 tablet 1   spironolactone (ALDACTONE) 50 MG tablet Take 1 tablet (50 mg total) by mouth daily. 30 tablet 0   spironolactone (ALDACTONE) 50 MG tablet Take 1 tablet (50 mg total) by mouth daily. 30 tablet 4   traMADol (ULTRAM) 50 MG tablet Take 1 tablet (50 mg total) by mouth every 6 (six) hours as needed. 30 tablet 0   No current facility-administered medications on file prior to visit.    Allergies  Allergen Reactions   Penicillins Swelling and Rash    Has patient had a PCN reaction causing immediate rash, facial/tongue/throat swelling, SOB or lightheadedness with hypotension: Yes/No:[id number] Yes Has patient had a PCN reaction causing severe rash involving mucus  membranes or skin necrosis: Yes/No:[id number] No Has patient had a PCN reaction that required hospitalization Yes/No:[id number] Yes Has patient had a PCN reaction occurring within the last 10 years: Yes/No:[id number] No If all of the above answers are "NO", then may proceed with Cephalospori    Social History   Socioeconomic History   Marital status: Single    Spouse name: Not on file   Number of children: Not on file   Years of education: Not on file   Highest education level: Not on file  Occupational History   Not on file  Tobacco Use   Smoking status: Former    Packs/day: 1.00    Types: Cigars, Cigarettes    Quit date: 05/10/2015    Years since quitting: 5.5   Smokeless tobacco: Never  Vaping Use   Vaping Use: Never used  Substance and Sexual Activity   Alcohol use: Yes    Comment: occ   Drug use: No   Sexual activity: Not on file  Other Topics Concern   Not on file  Social History Narrative   Not on file   Social Determinants of Health   Financial Resource Strain: Not on file  Food Insecurity: Not on file  Transportation Needs: Not on file  Physical Activity: Not on file  Stress: Not on file  Social Connections: Not on file  Intimate Partner Violence: Not on file    Family History  Problem Relation Age of Onset   Cancer Mother     Past Surgical History:  Procedure Laterality Date   CHOLECYSTECTOMY      ROS: Review of Systems Negative except as stated above  PHYSICAL EXAM: BP (!) 149/96   Pulse 68   Resp 16   Wt 216 lb 9.6 oz (98.2 kg)   SpO2 99%   BMI 27.81 kg/m   Physical Exam   General appearance - alert, well appearing, and in no distress Mental status - normal mood, behavior, speech, dress, motor activity, and thought processes Mouth -pt has several cavities. 3rd molar and wisdom teeth in RT lower jaw are broken off in gum and surrounding soft tissue inflammed.  No abscess seen Chest - clear to auscultation, no wheezes, rales or rhonchi,  symmetric air entry Heart - normal rate, regular rhythm, normal S1, S2, no murmurs, rubs, clicks or gallops Musculoskeletal - no tenderness on palpation of cervical spine.  Good ROM of neck Extremities - peripheral pulses normal, no pedal edema, no clubbing or cyanosis  CMP Latest Ref Rng & Units 11/17/2020 08/28/2020 01/29/2019  Glucose 65 - 99 mg/dL 126(H) 123(H) 90  BUN 6 - 24 mg/dL '13 10 10  '$ Creatinine 0.76 -  1.27 mg/dL 0.93 1.21 1.13  Sodium 134 - 144 mmol/L 139 138 140  Potassium 3.5 - 5.2 mmol/L 4.0 4.2 4.4  Chloride 96 - 106 mmol/L 102 101 100  CO2 20 - 29 mmol/L '23 27 25  '$ Calcium 8.7 - 10.2 mg/dL 9.1 9.4 9.6  Total Protein 6.5 - 8.1 g/dL - 8.5(H) 8.0  Total Bilirubin 0.3 - 1.2 mg/dL - 0.6 0.7  Alkaline Phos 38 - 126 U/L - 86 116  AST 15 - 41 U/L - 19 16  ALT 0 - 44 U/L - 25 17   Lipid Panel     Component Value Date/Time   CHOL 129 10/19/2020 1136   TRIG 114 10/19/2020 1136   HDL 39 (L) 10/19/2020 1136   CHOLHDL 3.3 10/19/2020 1136   LDLCALC 69 10/19/2020 1136    CBC    Component Value Date/Time   WBC 7.0 08/28/2020 1557   RBC 5.76 08/28/2020 1557   HGB 17.4 (H) 08/28/2020 1557   HGB 17.9 (H) 01/29/2019 1044   HCT 51.0 08/28/2020 1557   HCT 50.5 01/29/2019 1044   PLT 250 08/28/2020 1557   PLT 343 01/29/2019 1044   MCV 88.5 08/28/2020 1557   MCV 84 01/29/2019 1044   MCH 30.2 08/28/2020 1557   MCHC 34.1 08/28/2020 1557   RDW 13.5 08/28/2020 1557   RDW 13.1 01/29/2019 1044   LYMPHSABS 0.9 08/28/2020 1557   LYMPHSABS 3.9 (H) 08/14/2017 1630   MONOABS 0.7 08/28/2020 1557   EOSABS 0.1 08/28/2020 1557   EOSABS 0.2 08/14/2017 1630   BASOSABS 0.0 08/28/2020 1557   BASOSABS 0.1 08/14/2017 1630    ASSESSMENT AND PLAN:  1. Essential hypertension Not at goal. Continue current meds.  Pt plans to pick up Cozaar today and keep f/u appt with pharmacist  2. Pain due to dental caries Needs to see dentist soon.  Recommend applying for the OC Continue course of [place]  abx - traMADol (ULTRAM) 50 MG tablet; Take 1 tablet (50 mg total) by mouth every 6 (six) hours as needed.  Dispense: 15 tablet; Refill: 0  3. Neck pain Know DJD Given Voltaren gel and RF Robaxin - methocarbamol (ROBAXIN) 500 MG tablet; Take 1 tablet (500 mg total) by mouth every 8 (eight) hours as needed for muscle spasms.  Dispense: 30 tablet; Refill: 1 - diclofenac Sodium (VOLTAREN) 1 % GEL; Apply 2 g topically 4 (four) times daily.  Dispense: 100 g; Refill: 2  Patient was given the opportunity to ask questions.  Patient verbalized understanding of the plan and was able to repeat key elements of the plan.   No orders of the defined types were placed in this encounter.    Requested Prescriptions    No prescriptions requested or ordered in this encounter    No follow-ups on file.  Karle Plumber, MD, FACP

## 2020-12-08 ENCOUNTER — Ambulatory Visit: Payer: Self-pay | Admitting: Pharmacist

## 2020-12-22 ENCOUNTER — Other Ambulatory Visit: Payer: Self-pay

## 2020-12-27 ENCOUNTER — Ambulatory Visit: Payer: Self-pay | Admitting: Internal Medicine

## 2020-12-30 ENCOUNTER — Ambulatory Visit: Payer: Self-pay | Attending: Internal Medicine | Admitting: Pharmacist

## 2020-12-30 ENCOUNTER — Other Ambulatory Visit: Payer: Self-pay

## 2020-12-30 ENCOUNTER — Encounter: Payer: Self-pay | Admitting: Internal Medicine

## 2020-12-30 VITALS — BP 157/111 | HR 81

## 2020-12-30 DIAGNOSIS — I10 Essential (primary) hypertension: Secondary | ICD-10-CM

## 2020-12-30 NOTE — Progress Notes (Signed)
   S:   PCP: Dr. Wynetta Emery   Patient in good spirits. He was last seen and referred by Dr. Wynetta Emery on 11/25/2020. BP was 149/96 at that visit but he had not started losartan.   Patient denies adherence with medications. Denies chest pain, dyspnea, or blurred vision. He is compliant with carvedilol, spironolactone, and amlodipine. However. He never started the losartan.   Current BP Medications include:  Amlodipine 10 mg daily, spironolactone 50 mg daily, carvedilol 25 mg BID, losartan 25 mg (not taking)  Dietary habits include:admits to using salt; cut back on the amount of soda he drinks Exercise habits include: none outside of work. Works as a Building surveyor at work.  Family / Social history: no pertinent positives; current smoker of 1 cigar a week, occasional alcohol use  Home BP readings:  - No log, these are reported - "It's been pretty good" then gives range of 170s/90s  O:  Vitals:   12/30/20 0953  BP: (!) 157/111  Pulse: 81    Last 3 Office BP readings: BP Readings from Last 3 Encounters:  12/30/20 (!) 157/111  11/25/20 (!) 149/96  11/10/20 (!) 151/95   BMET    Component Value Date/Time   NA 139 11/17/2020 1524   K 4.0 11/17/2020 1524   CL 102 11/17/2020 1524   CO2 23 11/17/2020 1524   GLUCOSE 126 (H) 11/17/2020 1524   GLUCOSE 123 (H) 08/28/2020 1557   BUN 13 11/17/2020 1524   CREATININE 0.93 11/17/2020 1524   CREATININE 0.97 12/12/2015 1306   CALCIUM 9.1 11/17/2020 1524   GFRNONAA >60 08/28/2020 1557   GFRNONAA >89 12/12/2015 1306   GFRAA 91 01/29/2019 1044   GFRAA >89 12/12/2015 1306   Renal function: CrCl cannot be calculated (Patient's most recent lab result is older than the maximum 21 days allowed.).  Clinical ASCVD: No  The ASCVD Risk score (Arnett DK, et al., 2019) failed to calculate for the following reasons:   The valid total cholesterol range is 130 to 320 mg/dL  A/P: Hypertension longstanding currently uncontrolled on current medications. BP  Goal <130/80 mmHg. Patient is not adherent with losartan but tells me he will pick this up today. He reports compliance with amlodipine, carvedilol, and spironolactone.  -Continued current regimen. Advised him to pick up losartan.  -Counseled on lifestyle modifications for blood pressure control including reduced dietary sodium, increased exercise, adequate sleep  Results reviewed and written information provided. Total time in face-to-face counseling 15 minutes.   F/U visit in 1 month.   Benard Halsted, PharmD, [facility], [place] [phone]

## 2021-01-06 ENCOUNTER — Other Ambulatory Visit: Payer: Self-pay

## 2021-01-06 ENCOUNTER — Ambulatory Visit: Payer: Self-pay | Attending: Internal Medicine | Admitting: Internal Medicine

## 2021-01-06 VITALS — BP 134/95 | HR 72 | Ht 74.0 in | Wt 217.4 lb

## 2021-01-06 DIAGNOSIS — I10 Essential (primary) hypertension: Secondary | ICD-10-CM

## 2021-01-06 DIAGNOSIS — R7303 Prediabetes: Secondary | ICD-10-CM

## 2021-01-06 DIAGNOSIS — E559 Vitamin D deficiency, unspecified: Secondary | ICD-10-CM

## 2021-01-06 DIAGNOSIS — Z1211 Encounter for screening for malignant neoplasm of colon: Secondary | ICD-10-CM

## 2021-01-06 NOTE — Progress Notes (Signed)
Fatigue Nausea/diarrhea.

## 2021-01-06 NOTE — Progress Notes (Signed)
Patient ID: [name], male    DOB: 23-Apr-1975  MRN: [id number]  CC: Hypertension   Subjective: [name] is a 45 y.o. male who presents for UC  His concerns today include:  Hx of poorly HTN, tob dep, poor dentition, chronic lower back pain, history of reflex syncope, callus of left Achilles tendon.   Patient reports that he never picked up the prescription for the Cozaar since it was prescribed by the clinical pharmacist.  He is afraid that it may increase his potassium and may damage his kidney.  He has had 2 BMPs done since May of this year and kidney function and potassium levels have been good.  He also reports getting hot flashes intermittently at times.  He wonders whether it may be due to him taking carvedilol on an empty stomach.  He had a TSH level done about 3 months ago that was normal. Diagnosed with having prediabetes on blood test done back in August when he saw the physician assistant.  His A1c was 6.2.  Also found to have low vitamin D level.  He has been taking vitamin D 1000 IU daily from over-the-counter.  He reports that the person who called to give him the results went over everything very quickly. He has been trying to do better with his diet.  He tells me that his diet is positive about 40% of the times.  He has cut out fried chicken and is also cut back on drinking sodas.  Previously he will drink several sodas in a day.  Now he will drink only 1 to 2 cans a day.    HM: Due for flu shot and colon cancer screening.  I told him about the fit test.  He states he will get both the flu shot and pick up the fit test when he returns to the lab 2 weeks  Patient Active Problem List   Diagnosis Date Noted   Essential hypertension 09/08/2020   Syncope, near 03/20/2019   Pre-ulcerative corn or callous 02/01/2019   Disorder of left Achilles tendon 02/01/2019   Inguinal hernia of left side without obstruction or gangrene 12/08/2016   Tobacco use 12/08/2016   Localized  swelling, mass and lump, multiple sites 12/08/2016   Subcutaneous nodules 07/09/2016   Acute left-sided thoracic back pain 02/16/2016   Left inguinal hernia 12/12/2015   Dental abscess 05/27/2015   Periodontitis 05/27/2015   Pain in left wrist 05/27/2015   HTN (hypertension) 05/12/2015     Current Outpatient Medications on File Prior to Visit  Medication Sig Dispense Refill   acetaminophen (TYLENOL 8 HOUR) 650 MG CR tablet Take 1 tablet (650 mg total) by mouth every 8 (eight) hours as needed for pain. 100 tablet 1   amLODipine (NORVASC) 10 MG tablet Take 1 tablet (10 mg total) by mouth daily. 30 tablet 4   carvedilol (COREG) 25 MG tablet TAKE 1 TABLET (25 MG TOTAL) BY MOUTH 2 (TWO) TIMES DAILY WITH A MEAL. 60 tablet 4   cetirizine (ZYRTEC) 10 MG tablet TAKE 1 TABLET (10 MG TOTAL) BY MOUTH DAILY. 30 tablet 1   diclofenac Sodium (VOLTAREN) 1 % GEL Apply 2 g topically 4 (four) times daily. 100 g 2   fluticasone (FLONASE) 50 MCG/ACT nasal spray PLACE 2 SPRAYS INTO BOTH NOSTRILS DAILY. 16 g 1   ibuprofen (ADVIL) 800 MG tablet Take 1 tablet (800 mg total) by mouth every 8 (eight) hours as needed for moderate pain. 30 tablet 0  losartan (COZAAR) 25 MG tablet Take 1 tablet (25 mg total) by mouth daily. 30 tablet 2   methocarbamol (ROBAXIN) 500 MG tablet Take 1 tablet (500 mg total) by mouth every 8 (eight) hours as needed for muscle spasms. 30 tablet 1   spironolactone (ALDACTONE) 50 MG tablet Take 1 tablet (50 mg total) by mouth daily. 30 tablet 4   traMADol (ULTRAM) 50 MG tablet Take 1 tablet (50 mg total) by mouth every 6 (six) hours as needed. 15 tablet 0   clindamycin (CLEOCIN) 150 MG capsule TAKE 1 CAPSULE BY MOUTH THREE TIMES A DAY (Patient not taking: Reported on 01/06/2021) 21 capsule 0   No current facility-administered medications on file prior to visit.    Allergies  Allergen Reactions   Penicillins Swelling and Rash    Has patient had a PCN reaction causing immediate rash,  facial/tongue/throat swelling, SOB or lightheadedness with hypotension: Yes/No:[id number] Yes Has patient had a PCN reaction causing severe rash involving mucus membranes or skin necrosis: NoNo Has patient had a PCN reaction that required hospitalization Yes/No:[id number] Yes Has patient had a PCN reaction occurring within the last 10 years: Yes/No:[id number] No If all of the above answers are "NO", then may proceed with Cephalospori    Social History   Socioeconomic History   Marital status: Single    Spouse name: Not on file   Number of children: Not on file   Years of education: Not on file   Highest education level: Not on file  Occupational History   Not on file  Tobacco Use   Smoking status: Former    Packs/day: 1.00    Types: Cigars, Cigarettes    Quit date: 05/10/2015    Years since quitting: 5.6   Smokeless tobacco: Never  Vaping Use   Vaping Use: Never used  Substance and Sexual Activity   Alcohol use: Yes    Comment: occ   Drug use: No   Sexual activity: Not on file  Other Topics Concern   Not on file  Social History Narrative   Not on file   Social Determinants of Health   Financial Resource Strain: Not on file  Food Insecurity: Not on file  Transportation Needs: Not on file  Physical Activity: Not on file  Stress: Not on file  Social Connections: Not on file  Intimate Partner Violence: Not on file    Family History  Problem Relation Age of Onset   Cancer Mother     Past Surgical History:  Procedure Laterality Date   CHOLECYSTECTOMY      ROS: Review of Systems Negative except as stated above  PHYSICAL EXAM: BP (!) 134/95   Pulse 72   Ht 6\' 2"  (1.88 m)   Wt 217 lb 6.4 oz (98.6 kg)   SpO2 99%   BMI 27.91 kg/m   Wt Readings from Last 3 Encounters:  01/06/21 217 lb 6.4 oz (98.6 kg)  11/25/20 216 lb 9.6 oz (98.2 kg)  10/19/20 223 lb 9.6 oz (101.4 kg)    Physical Exam  General appearance - alert, well appearing, and in no distress Mental  status - normal mood, behavior, speech, dress, motor activity, and thought processes Eyes - pupils equal and reactive, extraocular eye movements intact Chest - clear to auscultation, no wheezes, rales or rhonchi, symmetric air entry Heart - normal rate, regular rhythm, normal S1, S2, no murmurs, rubs, clicks or gallops  CMP Latest Ref Rng & Units 11/17/2020 08/28/2020 01/29/2019  Glucose 65 - 99 mg/dL 126(H) 123(H)  90  BUN 6 - 24 mg/dL 13 10 10   Creatinine 0.76 - 1.27 mg/dL 0.93 1.21 1.13  Sodium 134 - 144 mmol/L 139 138 140  Potassium 3.5 - 5.2 mmol/L 4.0 4.2 4.4  Chloride 96 - 106 mmol/L 102 101 100  CO2 20 - 29 mmol/L 23 27 25   Calcium 8.7 - 10.2 mg/dL 9.1 9.4 9.6  Total Protein 6.5 - 8.1 g/dL - 8.5(H) 8.0  Total Bilirubin 0.3 - 1.2 mg/dL - 0.6 0.7  Alkaline Phos 38 - 126 U/L - 86 116  AST 15 - 41 U/L - 19 16  ALT 0 - 44 U/L - 25 17   Lipid Panel     Component Value Date/Time   CHOL 129 10/19/2020 1136   TRIG 114 10/19/2020 1136   HDL 39 (L) 10/19/2020 1136   CHOLHDL 3.3 10/19/2020 1136   LDLCALC 69 10/19/2020 1136    CBC    Component Value Date/Time   WBC 7.0 08/28/2020 1557   RBC 5.76 08/28/2020 1557   HGB 17.4 (H) 08/28/2020 1557   HGB 17.9 (H) 01/29/2019 1044   HCT 51.0 08/28/2020 1557   HCT 50.5 01/29/2019 1044   PLT 250 08/28/2020 1557   PLT 343 01/29/2019 1044   MCV 88.5 08/28/2020 1557   MCV 84 01/29/2019 1044   MCH 30.2 08/28/2020 1557   MCHC 34.1 08/28/2020 1557   RDW 13.5 08/28/2020 1557   RDW 13.1 01/29/2019 1044   LYMPHSABS 0.9 08/28/2020 1557   LYMPHSABS 3.9 (H) 08/14/2017 1630   MONOABS 0.7 08/28/2020 1557   EOSABS 0.1 08/28/2020 1557   EOSABS 0.2 08/14/2017 1630   BASOSABS 0.0 08/28/2020 1557   BASOSABS 0.1 08/14/2017 1630    ASSESSMENT AND PLAN: 1. Essential hypertension Not at goal. Encourage patient to try the Cozaar as prescribed.  I told him that once he has been on the medication for 2 weeks, he can return to the lab for [place] to check his  kidney function and potassium levels.  So far they have been fine.  He is agreeable to this plan.  Also advised to take his blood pressure medicines with meal if he feels that he gets hot flashes when he takes the carvedilol on an empty stomach. - Basic Metabolic Panel; Future  2. Prediabetes Dietary counseling given.  Commended him on cutting back on sodas.  Encouraged him to eliminate sugary drinks from the diet including sodas, sweet tea and juices.  Encouraged to drink more water.  Advised to eat more white lean meat instead of red meat or pork, cut back on portion sizes of white carbohydrates and incorporate fresh fruits and vegetables into the diet daily.  Encouraged him to try to get in some form of moderate intensity exercise several days a week  3. Vitamin D deficiency - VITAMIN D 25 Hydroxy (Vit-D Deficiency, Fractures); Future  4. Screening for colon cancer - Fecal occult blood, imunochemical([facility])   Patient was given the opportunity to ask questions.  Patient verbalized understanding of the plan and was able to repeat key elements of the plan.   No orders of the defined types were placed in this encounter.    Requested Prescriptions    No prescriptions requested or ordered in this encounter    No follow-ups on file.  Karle Plumber, MD, FACP

## 2021-01-06 NOTE — Patient Instructions (Addendum)
Return to the lab in 2 weeks for recheck of your kidney function and vitamin D level.  When you come to the lab that day ask for the colon cancer screening kit.  If tried to get the flu shot, can also request to get the shot for data to come to the lab.

## 2021-01-20 ENCOUNTER — Ambulatory Visit: Payer: Self-pay | Attending: Internal Medicine | Admitting: Pharmacist

## 2021-01-20 ENCOUNTER — Other Ambulatory Visit: Payer: Self-pay

## 2021-01-20 DIAGNOSIS — I10 Essential (primary) hypertension: Secondary | ICD-10-CM

## 2021-01-20 NOTE — Progress Notes (Signed)
   S:   PCP: Dr. Wynetta Emery   Patient in good spirits. He was last seen and referred by Dr. Wynetta Emery on 01/06/2021. BP was 134/95 mmHg at that visit but he had not started losartan.   Today, patient denies adherence with medications. Denies chest pain, dyspnea, or blurred vision. He is compliant with carvedilol, spironolactone, and amlodipine. However, he still has not started the losartan.   Current BP Medications include:  Amlodipine 10 mg daily, spironolactone 50 mg daily, carvedilol 25 mg BID, losartan 25 mg (not taking)  Dietary habits include:admits to using salt; cut back on the amount of soda he drinks Exercise habits include: none outside of work. Works as a Building surveyor at work.  Family / Social history: no pertinent positives; current smoker of 1 cigar a week, occasional alcohol use  Home BP readings:  - No values given  O:  There were no vitals filed for this visit.  Last 3 Office BP readings: BP Readings from Last 3 Encounters:  01/06/21 (!) 134/95  12/30/20 (!) 157/111  11/25/20 (!) 149/96   BMET    Component Value Date/Time   NA 139 11/17/2020 1524   K 4.0 11/17/2020 1524   CL 102 11/17/2020 1524   CO2 23 11/17/2020 1524   GLUCOSE 126 (H) 11/17/2020 1524   GLUCOSE 123 (H) 08/28/2020 1557   BUN 13 11/17/2020 1524   CREATININE 0.93 11/17/2020 1524   CREATININE 0.97 12/12/2015 1306   CALCIUM 9.1 11/17/2020 1524   GFRNONAA >60 08/28/2020 1557   GFRNONAA >89 12/12/2015 1306   GFRAA 91 01/29/2019 1044   GFRAA >89 12/12/2015 1306   Renal function: CrCl cannot be calculated (Patient's most recent lab result is older than the maximum 21 days allowed.).  Clinical ASCVD: No  The ASCVD Risk score (Arnett DK, et al., 2019) failed to calculate for the following reasons:   The valid total cholesterol range is 130 to 320 mg/dL  A/P: Hypertension longstanding currently uncontrolled on current medications. BP Goal <130/80 mmHg. Patient is not adherent with losartan but  tells me he will pick this up today. I confirmed with the pharmacy that he has picked this up. He will return next week after having taken the losartan for recheck and labs.  -Continued current regimen. Advised him to pick up losartan.  -Counseled on lifestyle modifications for blood pressure control including reduced dietary sodium, increased exercise, adequate sleep  Results reviewed and written information provided. Total time in face-to-face counseling 15 minutes.   F/U visit in 1 week.   Benard Halsted, PharmD, [facility], [place] [phone]

## 2021-01-27 ENCOUNTER — Encounter: Payer: Self-pay | Admitting: Pharmacist

## 2021-01-27 ENCOUNTER — Other Ambulatory Visit: Payer: Self-pay

## 2021-01-27 ENCOUNTER — Ambulatory Visit: Payer: Self-pay | Attending: Internal Medicine | Admitting: Pharmacist

## 2021-01-27 DIAGNOSIS — E559 Vitamin D deficiency, unspecified: Secondary | ICD-10-CM

## 2021-01-27 DIAGNOSIS — I10 Essential (primary) hypertension: Secondary | ICD-10-CM

## 2021-01-27 NOTE — Patient Instructions (Signed)

## 2021-01-27 NOTE — Progress Notes (Signed)
   S:   PCP: Dr. Wynetta Emery   Patient in good spirits. He was last seen and referred by Dr. Wynetta Emery on 01/06/2021. We saw him on 01/20/2021 and he had still not picked up the losartan.  Today, patient reports adherence with medications. Denies chest pain, dyspnea, or blurred vision. He is compliant with carvedilol, spironolactone, and amlodipine. He picked up and started losartan last Friday (01/13/2021).  Current BP Medications include:  Amlodipine 10 mg daily, spironolactone 50 mg daily, carvedilol 25 mg BID, losartan 25 mg   Dietary habits include:admits to using salt; cut back on the amount of soda he drinks Exercise habits include: none outside of work. Works as a Building surveyor at work.  Family / Social history: no pertinent positives; current smoker of 1 cigar a week, occasional alcohol use  Home BP readings:  - No values given  O:  Vitals:   01/27/21 1124  BP: 111/76    Last 3 Office BP readings: BP Readings from Last 3 Encounters:  01/27/21 111/76  01/06/21 (!) 134/95  12/30/20 (!) 157/111   BMET    Component Value Date/Time   NA 139 11/17/2020 1524   K 4.0 11/17/2020 1524   CL 102 11/17/2020 1524   CO2 23 11/17/2020 1524   GLUCOSE 126 (H) 11/17/2020 1524   GLUCOSE 123 (H) 08/28/2020 1557   BUN 13 11/17/2020 1524   CREATININE 0.93 11/17/2020 1524   CREATININE 0.97 12/12/2015 1306   CALCIUM 9.1 11/17/2020 1524   GFRNONAA >60 08/28/2020 1557   GFRNONAA >89 12/12/2015 1306   GFRAA 91 01/29/2019 1044   GFRAA >89 12/12/2015 1306   Renal function: CrCl cannot be calculated (Patient's most recent lab result is older than the maximum 21 days allowed.).  Clinical ASCVD: No  The ASCVD Risk score (Arnett DK, et al., 2019) failed to calculate for the following reasons:   The valid total cholesterol range is 130 to 320 mg/dL  A/P: Hypertension longstanding currently at goal on current medications. BP Goal <130/80 mmHg. Patient is adherent with all anti-hypertensives.    -Continued current regimen.  -Labs: per PCP, vitD-OH, BMP -Counseled on lifestyle modifications for blood pressure control including reduced dietary sodium, increased exercise, adequate sleep  Results reviewed and written information provided. Total time in face-to-face counseling 15 minutes.   F/U visit with PCP in January.   Benard Halsted, PharmD, [facility], [place] [phone]

## 2021-01-28 ENCOUNTER — Encounter: Payer: Self-pay | Admitting: Internal Medicine

## 2021-01-28 DIAGNOSIS — R7303 Prediabetes: Secondary | ICD-10-CM | POA: Insufficient documentation

## 2021-01-28 DIAGNOSIS — E559 Vitamin D deficiency, unspecified: Secondary | ICD-10-CM | POA: Insufficient documentation

## 2021-01-28 LAB — BASIC METABOLIC PANEL
BUN/Creatinine Ratio: 10 (ref 9–20)
BUN: 10 mg/dL (ref 6–24)
CO2: 25 mmol/L (ref 20–29)
Calcium: 9.7 mg/dL (ref 8.7–10.2)
Chloride: 98 mmol/L (ref 96–106)
Creatinine, Ser: 1.05 mg/dL (ref 0.76–1.27)
Glucose: 128 mg/dL — ABNORMAL HIGH (ref 70–99)
Potassium: 4.1 mmol/L (ref 3.5–5.2)
Sodium: 138 mmol/L (ref 134–144)
eGFR: 89 mL/min/{1.73_m2} (ref >59)

## 2021-01-28 LAB — VITAMIN D 25 HYDROXY (VIT D DEFICIENCY, FRACTURES): Vit D, 25-Hydroxy: 33.9 ng/mL (ref 30.0–100.0)

## 2021-01-28 NOTE — Progress Notes (Signed)
Let patient know that his vitamin D level has normalized.  Continue vitamin D supplement.  Kidney function is good.

## 2021-01-30 ENCOUNTER — Telehealth: Payer: Self-pay

## 2021-01-30 NOTE — Telephone Encounter (Signed)
Contacted pt to go over lab results pt is aware and doesn't have any questions or concerns 

## 2021-02-10 ENCOUNTER — Other Ambulatory Visit: Payer: Self-pay

## 2021-02-16 ENCOUNTER — Other Ambulatory Visit: Payer: Self-pay

## 2021-03-31 ENCOUNTER — Ambulatory Visit: Payer: Self-pay | Admitting: Internal Medicine

## 2021-04-10 ENCOUNTER — Emergency Department (HOSPITAL_COMMUNITY): Admission: EM | Admit: 2021-04-10 | Discharge: 2021-04-11 | Discharge status: Against Medical Advice | Payer: Self-pay

## 2021-04-12 ENCOUNTER — Other Ambulatory Visit: Payer: Self-pay

## 2021-04-12 ENCOUNTER — Encounter (HOSPITAL_COMMUNITY): Payer: Self-pay

## 2021-04-12 ENCOUNTER — Emergency Department (HOSPITAL_COMMUNITY)
Admission: EM | Admit: 2021-04-12 | Discharge: 2021-04-12 | Disposition: A | Discharge status: Home / Self Care | Payer: Self-pay | Attending: Emergency Medicine | Admitting: Emergency Medicine

## 2021-04-12 DIAGNOSIS — Z79899 Other long term (current) drug therapy: Secondary | ICD-10-CM | POA: Insufficient documentation

## 2021-04-12 DIAGNOSIS — L739 Follicular disorder, unspecified: Secondary | ICD-10-CM | POA: Insufficient documentation

## 2021-04-12 MED ORDER — DOXYCYCLINE HYCLATE 100 MG PO TABS
100.0000 mg | ORAL_TABLET | Freq: Two times a day (BID) | ORAL | 0 refills | Status: DC
  Filled 2021-04-12: qty 20, 10d supply, fill #0

## 2021-04-12 MED ORDER — DOXYCYCLINE HYCLATE 100 MG PO TABS
100.0000 mg | ORAL_TABLET | Freq: Once | ORAL | Status: AC
  Administered 2021-04-12: 100 mg via ORAL
  Filled 2021-04-12: qty 1

## 2021-04-12 NOTE — ED Triage Notes (Signed)
Pt c/o abscess to his lower jaw right under his chin. Pt states this has been bothering him since 1/28.

## 2021-04-12 NOTE — ED Provider Triage Note (Signed)
Emergency Medicine Provider Triage Evaluation Note  SAL SPRATLEY , a 46 y.o. male  was evaluated in triage.  Pt complains of concern for an ingrown hair on his chin. He first noticed it on 4 days ago.  He did try a warm rag and pressed on it.  No fevers, no dental concerns.    Last tdap 09/06/2016.   Physical Exam  BP (!) 164/108 (BP Location: Right Arm)    Pulse 71    Temp 98.1 F (36.7 C) (Oral)    Resp 16    Ht 6\' 3"  (1.905 m)    Wt 96.2 kg    SpO2 98%    BMI 26.50 kg/m  Gen:   Awake, no distress   Resp:  Normal effort  MSK:   Moves extremities without difficulty  Other:  Normal phonation. No elevation of the floor of the mouth.  About 3cm area of induration under the chin with a centralized pustule.   Medical Decision Making  Medically screening exam initiated at 12:57 PM.  Appropriate orders placed.  [name] was informed that the remainder of the evaluation will be completed by another provider, this initial triage assessment does not replace that evaluation, and the importance of remaining in the ED until their evaluation is complete.     Lorin Glass, [place] 04/12/21 1304

## 2021-04-12 NOTE — Discharge Instructions (Addendum)
Your evaluated in the emergency room for potential abscess under your chin.  He has not had any fevers, drainage from the site.  We discussed attempting to drain this area versus treating with antibiotics and watching.  He decided to treat with antibiotics and monitor and if it gets worse or progresses to return or go to your primary care provider for drainage at that time.  If you develop fevers, increase in size, or if this is area becomes soft that may mean that this needs to be drained he need to be evaluated.  At that time he can return to the emergency room, go to an urgent care, or see your primary care provider.  I have sent in antibiotics to your pharmacy.  You did get the first dose in the emergency room today.

## 2021-04-12 NOTE — ED Provider Notes (Signed)
[facility] DEPT Provider Note   CSN: [id number] Arrival date & time: 04/12/21  1233     History  Chief Complaint  Patient presents with   Abscess    [name] is a 46 y.o. male.  46 year old male presents today for evaluation of swelling below his chin.  This has been present since Saturday.  Patient reports he initially did warm compresses for the first couple days but has not done any since.  Denies significant pain and has not needed to take anything over-the-counter for pain.  Denies fever, chills, drainage from this area.  He is without other complaints at this time.  Denies prior history of MRSA infections.  The history is provided by the patient. No language interpreter was used.      Home Medications Prior to Admission medications   Medication Sig Start Date End Date Taking? Authorizing Provider  doxycycline (VIBRAMYCIN) 100 MG capsule Take 1 capsule (100 mg total) by mouth 2 (two) times daily. 04/12/21  Yes Deatra Canter, Michell Giuliano, PA-C  acetaminophen (TYLENOL 8 HOUR) 650 MG CR tablet Take 1 tablet (650 mg total) by mouth every 8 (eight) hours as needed for pain. 02/06/19   Ladell Pier, MD  amLODipine (NORVASC) 10 MG tablet Take 1 tablet (10 mg total) by mouth daily. 10/19/20 10/19/21  Argentina Donovan, PA-C  carvedilol (COREG) 25 MG tablet TAKE 1 TABLET (25 MG TOTAL) BY MOUTH 2 (TWO) TIMES DAILY WITH A MEAL. 10/19/20 10/19/21  Argentina Donovan, PA-C  cetirizine (ZYRTEC) 10 MG tablet TAKE 1 TABLET (10 MG TOTAL) BY MOUTH DAILY. 05/25/20 05/25/21  Charlott Rakes, MD  diclofenac Sodium (VOLTAREN) 1 % GEL Apply 2 g topically 4 (four) times daily. 11/25/20   Ladell Pier, MD  fluticasone (FLONASE) 50 MCG/ACT nasal spray PLACE 2 SPRAYS INTO BOTH NOSTRILS DAILY. 10/19/20 10/19/21  Argentina Donovan, PA-C  ibuprofen (ADVIL) 800 MG tablet Take 1 tablet (800 mg total) by mouth every 8 (eight) hours as needed for moderate pain. 06/08/19   Elsie Stain,  MD  losartan (COZAAR) 25 MG tablet Take 1 tablet (25 mg total) by mouth daily. 11/10/20   Ladell Pier, MD  methocarbamol (ROBAXIN) 500 MG tablet Take 1 tablet (500 mg total) by mouth every 8 (eight) hours as needed for muscle spasms. 11/25/20   Ladell Pier, MD  spironolactone (ALDACTONE) 50 MG tablet Take 1 tablet (50 mg total) by mouth daily. 10/19/20 10/19/21  Argentina Donovan, PA-C  traMADol (ULTRAM) 50 MG tablet Take 1 tablet (50 mg total) by mouth every 6 (six) hours as needed. 11/25/20   Ladell Pier, MD      Allergies    Penicillins    Review of Systems   Review of Systems  Constitutional:  Negative for activity change, chills and fever.  HENT:  Negative for sore throat and trouble swallowing.   Respiratory:  Negative for shortness of breath.   Gastrointestinal:  Negative for nausea and vomiting.  Musculoskeletal:  Negative for neck pain and neck stiffness.  Skin:  Positive for wound.  All other systems reviewed and are negative.  Physical Exam Updated Vital Signs BP (!) 164/108 (BP Location: Right Arm)    Pulse 71    Temp 98.1 F (36.7 C) (Oral)    Resp 16    Ht 6\' 3"  (1.905 m)    Wt 96.2 kg    SpO2 98%    BMI 26.50 kg/m  Physical Exam  Vitals and nursing note reviewed.  Constitutional:      General: He is not in acute distress.    Appearance: Normal appearance. He is not ill-appearing.  HENT:     Head: Normocephalic and atraumatic.     Nose: Nose normal.     Mouth/Throat:     Palate: No mass and lesions.     Pharynx: Oropharynx is clear. Uvula midline. No oropharyngeal exudate, posterior oropharyngeal erythema or uvula swelling.     Tonsils: No tonsillar exudate or tonsillar abscesses.     Comments: Without signs of PTA, RPA, Ludwick's angina. Eyes:     Conjunctiva/sclera: Conjunctivae normal.  Cardiovascular:     Rate and Rhythm: Normal rate and regular rhythm.  Pulmonary:     Effort: Pulmonary effort is normal. No respiratory distress.     Breath  sounds: Normal breath sounds. No wheezing or rales.  Musculoskeletal:        General: No deformity.  Skin:    Findings: No rash.     Comments: Area of induration below the chin and midline.  Without area of fluctuance.  Without evidence of Ludwick's angina.  Minimally tender to palpation.  Neurological:     Mental Status: He is alert.    ED Results / Procedures / Treatments   Labs (all labs ordered are listed, but only abnormal results are displayed) Labs Reviewed - No data to display  EKG None  Radiology No results found.  Procedures Procedures    Medications Ordered in ED Medications  doxycycline (VIBRA-TABS) tablet 100 mg (has no administration in time range)    ED Course/ Medical Decision Making/ A&P                           Medical Decision Making Risk Prescription drug management.   46 year old male presents today for evaluation of swelling below his chin.  This has been present since Saturday.  He did do some warm compresses but not over the past few days.  On exam area is indurated without any notable fluctuance.  Patient and I discussed options including I&D or management with antibiotics and observation.  Patient after discussion chooses to proceed with antibiotics and watchful waiting.  Discussed importance of warm compresses.  Will provide first dose antibiotic in the emergency room given patient is unsure if he will be able to pick up the antibiotics prior to his pharmacy closing today.  Patient voices understanding and is in agreement with plan.  Discussed importance of follow-up with primary care provider.  Return precautions discussed. Final Clinical Impression(s) / ED Diagnoses Final diagnoses:  Folliculitis    Rx / DC Orders ED Discharge Orders          Ordered    doxycycline (VIBRAMYCIN) 100 MG capsule  2 times daily        04/12/21 1440              Evlyn Courier, [place] 04/12/21 1445    Varney Biles, MD 04/13/21 1701

## 2021-04-13 ENCOUNTER — Other Ambulatory Visit: Payer: Self-pay | Admitting: Internal Medicine

## 2021-04-13 ENCOUNTER — Other Ambulatory Visit: Payer: Self-pay

## 2021-04-13 DIAGNOSIS — M542 Cervicalgia: Secondary | ICD-10-CM

## 2021-04-13 NOTE — Telephone Encounter (Signed)
Requested medication (s) are due for refill today: Yes  Requested medication (s) are on the active medication list: Yes  Last refill:  11/25/20  Future visit scheduled: Yes  Notes to clinic:  See request.    Requested Prescriptions  Pending Prescriptions Disp Refills   methocarbamol (ROBAXIN) 500 MG tablet 30 tablet 1    Sig: Take 1 tablet (500 mg total) by mouth every 8 (eight) hours as needed for muscle spasms.     Not Delegated - Analgesics:  Muscle Relaxants Failed - 04/13/2021 11:25 AM      Failed - This refill cannot be delegated      Passed - Valid encounter within last 6 months    Recent Outpatient Visits           2 months ago Essential hypertension   [place], RPH-CPP   2 months ago Essential hypertension   [place], Jarome Matin, RPH-CPP   3 months ago Essential hypertension   [place], MD   3 months ago Essential hypertension   [place], Stephen L, RPH-CPP   4 months ago Essential hypertension   [place], MD       Future Appointments             In 1 month [name], MD [place]

## 2021-04-20 ENCOUNTER — Other Ambulatory Visit: Payer: Self-pay

## 2021-04-26 ENCOUNTER — Other Ambulatory Visit: Payer: Self-pay

## 2021-04-27 ENCOUNTER — Other Ambulatory Visit: Payer: Self-pay

## 2021-04-28 ENCOUNTER — Other Ambulatory Visit: Payer: Self-pay

## 2021-05-17 ENCOUNTER — Telehealth: Payer: Self-pay

## 2021-05-17 ENCOUNTER — Other Ambulatory Visit: Payer: Self-pay

## 2021-05-17 MED ORDER — CLINDAMYCIN HCL 300 MG PO CAPS
300.0000 mg | ORAL_CAPSULE | Freq: Three times a day (TID) | ORAL | 0 refills | Status: DC
  Filled 2021-05-17: qty 21, 7d supply, fill #0

## 2021-05-17 NOTE — Telephone Encounter (Signed)
Pt was called and informed of medication being sent to pharmacy. 

## 2021-05-17 NOTE — Telephone Encounter (Signed)
Pt needs medication for dental infection. Pt is requesting Clindamycin. ?

## 2021-05-18 ENCOUNTER — Other Ambulatory Visit: Payer: Self-pay

## 2021-05-19 ENCOUNTER — Other Ambulatory Visit: Payer: Self-pay

## 2021-05-24 ENCOUNTER — Ambulatory Visit: Payer: Self-pay | Admitting: *Deleted

## 2021-05-24 ENCOUNTER — Emergency Department (HOSPITAL_COMMUNITY)
Admission: EM | Admit: 2021-05-24 | Discharge: 2021-05-24 | Disposition: A | Discharge status: Home / Self Care | Payer: Self-pay | Attending: Emergency Medicine | Admitting: Emergency Medicine

## 2021-05-24 ENCOUNTER — Encounter (HOSPITAL_COMMUNITY): Payer: Self-pay

## 2021-05-24 ENCOUNTER — Other Ambulatory Visit: Payer: Self-pay

## 2021-05-24 DIAGNOSIS — S025XXA Fracture of tooth (traumatic), initial encounter for closed fracture: Secondary | ICD-10-CM | POA: Insufficient documentation

## 2021-05-24 DIAGNOSIS — K0889 Other specified disorders of teeth and supporting structures: Secondary | ICD-10-CM

## 2021-05-24 DIAGNOSIS — X58XXXA Exposure to other specified factors, initial encounter: Secondary | ICD-10-CM | POA: Insufficient documentation

## 2021-05-24 DIAGNOSIS — Z79899 Other long term (current) drug therapy: Secondary | ICD-10-CM | POA: Insufficient documentation

## 2021-05-24 DIAGNOSIS — K029 Dental caries, unspecified: Secondary | ICD-10-CM | POA: Insufficient documentation

## 2021-05-24 MED ORDER — OXYCODONE HCL 5 MG PO TABS
5.0000 mg | ORAL_TABLET | Freq: Once | ORAL | Status: AC
  Administered 2021-05-24: 5 mg via ORAL
  Filled 2021-05-24: qty 1

## 2021-05-24 MED ORDER — KETOROLAC TROMETHAMINE 15 MG/ML IJ SOLN
15.0000 mg | Freq: Once | INTRAMUSCULAR | Status: AC
  Administered 2021-05-24: 15 mg via INTRAMUSCULAR
  Filled 2021-05-24: qty 1

## 2021-05-24 MED ORDER — ACETAMINOPHEN 500 MG PO TABS
1000.0000 mg | ORAL_TABLET | Freq: Once | ORAL | Status: AC
  Administered 2021-05-24: 1000 mg via ORAL
  Filled 2021-05-24: qty 2

## 2021-05-24 MED ORDER — CLINDAMYCIN HCL 150 MG PO CAPS
450.0000 mg | ORAL_CAPSULE | Freq: Three times a day (TID) | ORAL | 0 refills | Status: AC
  Filled 2021-05-24: qty 63, 7d supply, fill #0

## 2021-05-24 NOTE — Telephone Encounter (Signed)
Reason for Disposition ? Patient sounds very sick or weak to the triager ?   See notes ? ?Answer Assessment - Initial Assessment Questions ?1. SYMPTOM: "What's the main symptom you're concerned about?" (e.g., chapped lips, dry mouth, lump, sores) ?    Pt is being treated for dental abscess on right side.   Now left side is swollen.   ?2. ONSET: "When did the  swelling and pain  start?" ?    2 days ago   ?3. PAIN: "Is there any pain?" If Yes, ask: "How bad is it?" (Scale: 1-10; mild, moderate, severe) ?  - MILD (1-3):  doesn't interfere with eating or normal activities ?  - MODERATE (4-7): interferes with eating some solids and normal activities ?  - SEVERE (8-10):  excruciating pain, interferes with most normal activities ?  - SEVERE DYSPHAGIA: can't swallow liquids, drooling ?    Yes  Severe ?I talked with my nurse who sent the information to Dr. Wynetta Emery who prescribed the Clindomycin for the abscess on right.    ?4. CAUSE: "What do you think is causing the symptoms?" ?    Abscess    ?I saw the nurse Opal Sidles at [facility] and she told Dr. Wynetta Emery who prescribed the antibiotic.    ?5. OTHER SYMPTOMS: "Do you have any other symptoms?" (e.g., fever, sore throat, toothache, swelling) ?    Face swelling. ?I already talked with Opal Sidles and she told me to call in and get an appt.    ?6. PREGNANCY: "Is there any chance you are pregnant?" "When was your last menstrual period?" ?    N/A ? ?Protocols used: Mouth Symptoms-A-AH ? ?

## 2021-05-24 NOTE — Discharge Instructions (Addendum)
Take 4 over the counter ibuprofen tablets 3 times a day or 2 over-the-counter naproxen tablets twice a day for pain. Also take tylenol 1000mg(2 extra strength) four times a day.    

## 2021-05-24 NOTE — ED Triage Notes (Signed)
Pt arrived via POV, c/o dental pain x4 days. States pain started on right side of mouth, was seen, given abx, pain started to subside, now pain worsening on left side now. Was started on clindamycin, has not finished course yet. Does not currently have a dentist.  ?

## 2021-05-24 NOTE — Telephone Encounter (Signed)
?  Chief Complaint: severe dental pain  Has an abscess.    [name] from [facility] told him to call in and make an appt.   No appts available today at [facility]. ?Symptoms: Severe pain in mouth with swelling on left side of face ?Frequency: Started 2 days ago on left side.     Has an abscess on right side of mouth that he is on Clindamycin for. ?Pertinent Negatives: Patient denies N/A ?Disposition: '[x]'$ ED /'[]'$ Urgent Care (no appt availability in office) / '[]'$ Appointment(In office/virtual)/ '[]'$  [facility]/ '[]'$ Home Care/ '[]'$ Refused Recommended Disposition /'[]'$ [facility]/ '[]'$  Follow-up with PCP ?Additional Notes: Due to severe pain and swelling of left side of face referred to the ED.   No response from flow coordinator Selina Cooley when I sent a Teams message about getting him in today.    Pt agreeable to going to the ED.  ?

## 2021-05-24 NOTE — ED Provider Notes (Signed)
?[facility] DEPT ?Provider Note ? ? ?CSN: [id number] ?Arrival date & time: 05/24/21  3220 ? ?  ? ?History ? ?Chief Complaint  ?Patient presents with  ? Dental Pain  ? ? ?[name] is a 46 y.o. male. ? ?46 yo M with a cc of dental pain.  Going on for the past week or so.  Has poor dentition at baseline.  Smoker.  Denies injury.  Seen at fam doc this week, started on abx.  Now with pain on the other side. Low grade fever last night.  ? ? ? ?Dental Pain ? ?  ? ?Home Medications ?Prior to Admission medications   ?Medication Sig Start Date End Date Taking? Authorizing Provider  ?clindamycin (CLEOCIN) 150 MG capsule Take 3 capsules (450 mg total) by mouth 3 (three) times daily for 7 days. 05/24/21 05/31/21 Yes Deno Etienne, DO  ?acetaminophen (TYLENOL 8 HOUR) 650 MG CR tablet Take 1 tablet (650 mg total) by mouth every 8 (eight) hours as needed for pain. 02/06/19   Ladell Pier, MD  ?amLODipine (NORVASC) 10 MG tablet Take 1 tablet (10 mg total) by mouth daily. 10/19/20 10/19/21  Argentina Donovan, PA-C  ?carvedilol (COREG) 25 MG tablet TAKE 1 TABLET (25 MG TOTAL) BY MOUTH 2 (TWO) TIMES DAILY WITH A MEAL. 10/19/20 10/19/21  Argentina Donovan, PA-C  ?cetirizine (ZYRTEC) 10 MG tablet TAKE 1 TABLET (10 MG TOTAL) BY MOUTH DAILY. 05/25/20 05/25/21  Charlott Rakes, MD  ?clindamycin (CLEOCIN) 300 MG capsule Take 1 capsule (300 mg total) by mouth 3 (three) times daily. 05/17/21   Ladell Pier, MD  ?diclofenac Sodium (VOLTAREN) 1 % GEL Apply 2 g topically 4 (four) times daily. 11/25/20   Ladell Pier, MD  ?doxycycline (VIBRA-TABS) 100 MG tablet Take 1 tablet (100 mg total) by mouth 2 (two) times daily. 04/12/21   Deatra Canter, Amjad, PA-C  ?fluticasone (FLONASE) 50 MCG/ACT nasal spray PLACE 2 SPRAYS INTO BOTH NOSTRILS DAILY. 10/19/20 10/19/21  Argentina Donovan, PA-C  ?ibuprofen (ADVIL) 800 MG tablet Take 1 tablet (800 mg total) by mouth every 8 (eight) hours as needed for moderate pain. 06/08/19    Elsie Stain, MD  ?losartan (COZAAR) 25 MG tablet Take 1 tablet (25 mg total) by mouth daily. 11/10/20   Ladell Pier, MD  ?methocarbamol (ROBAXIN) 500 MG tablet Take 1 tablet (500 mg total) by mouth every 8 (eight) hours as needed for muscle spasms. 11/25/20   Ladell Pier, MD  ?spironolactone (ALDACTONE) 50 MG tablet Take 1 tablet (50 mg total) by mouth daily. 10/19/20 10/19/21  Argentina Donovan, PA-C  ?traMADol (ULTRAM) 50 MG tablet Take 1 tablet (50 mg total) by mouth every 6 (six) hours as needed. 11/25/20   Ladell Pier, MD  ?   ? ?Allergies    ?Penicillins   ? ?Review of Systems   ?Review of Systems ? ?Physical Exam ?Updated Vital Signs ?BP (!) 164/124 (BP Location: Right Arm)   Pulse 84   Temp 98 ?F (36.7 ?C) (Oral)   Resp 18   SpO2 98%  ?Physical Exam ?Vitals and nursing note reviewed.  ?Constitutional:   ?   Appearance: He is well-developed.  ?HENT:  ?   Head: Normocephalic and atraumatic.  ?   Mouth/Throat:  ?   Comments: Poor dentition diffusely.  Multiple caries, multiple molars fractured at gumline.  Purulent drainage around L 2nd molar.  No sublingual swelling tolerating secretions without difficulty able to  range his neck without pain. ?Eyes:  ?   Pupils: Pupils are equal, round, and reactive to light.  ?Neck:  ?   Vascular: No JVD.  ?Cardiovascular:  ?   Rate and Rhythm: Normal rate and regular rhythm.  ?   Heart sounds: No murmur heard. ?  No friction rub. No gallop.  ?Pulmonary:  ?   Effort: No respiratory distress.  ?   Breath sounds: No wheezing.  ?Abdominal:  ?   General: There is no distension.  ?   Tenderness: There is no abdominal tenderness. There is no guarding or rebound.  ?Musculoskeletal:     ?   General: Normal range of motion.  ?   Cervical back: Normal range of motion and neck supple.  ?Skin: ?   Coloration: Skin is not pale.  ?   Findings: No rash.  ?Neurological:  ?   Mental Status: He is alert and oriented to person, place, and time.  ?Psychiatric:     ?    Behavior: Behavior normal.  ? ? ?ED Results / Procedures / Treatments   ?Labs ?(all labs ordered are listed, but only abnormal results are displayed) ?Labs Reviewed - No data to display ? ?EKG ?None ? ?Radiology ?No results found. ? ?Procedures ?Procedures  ? ? ?Medications Ordered in ED ?Medications  ?acetaminophen (TYLENOL) tablet 1,000 mg (has no administration in time range)  ?ketorolac (TORADOL) 15 MG/ML injection 15 mg (has no administration in time range)  ?oxyCODONE (Oxy IR/ROXICODONE) immediate release tablet 5 mg (has no administration in time range)  ? ? ?ED Course/ Medical Decision Making/ A&P ?  ?                        ?Medical Decision Making ?Risk ?OTC drugs. ?Prescription drug management. ? ? ?46 yo M with a chief complaint of dental pain.  Patient unfortunately has been having problems with his teeth recently and seen his family doctor earlier in the week and was started on antibiotics.  He is now having pain on the other side.  On exam and by history he likely had a dental abscess appears to be actively draining on exam.  We will continue his course of clindamycin.  Encourage dental follow-up. ? ?9:11 AM:  I have discussed the diagnosis/risks/treatment options with the patient.  Evaluation and diagnostic testing in the emergency department does not suggest an emergent condition requiring admission or immediate intervention beyond what has been performed at this time.  They will follow up with  PCP. We also discussed returning to the ED immediately if new or worsening sx occur. We discussed the sx which are most concerning (e.g., sudden worsening pain, fever, inability to tolerate by mouth) that necessitate immediate return. Medications administered to the patient during their visit and any new prescriptions provided to the patient are listed below. ? ?Medications given during this visit ?Medications  ?acetaminophen (TYLENOL) tablet 1,000 mg (has no administration in time range)  ?ketorolac  (TORADOL) 15 MG/ML injection 15 mg (has no administration in time range)  ?oxyCODONE (Oxy IR/ROXICODONE) immediate release tablet 5 mg (has no administration in time range)  ? ? ? ?The patient appears reasonably screen and/or stabilized for discharge and I doubt any other medical condition or other [facility] requiring further screening, evaluation, or treatment in the ED at this time prior to discharge.  ? ? ? ? ? ? ? ? ?Final Clinical Impression(s) / ED Diagnoses ?Final diagnoses:  ?Pain,  dental  ? ? ?Rx / DC Orders ?ED Discharge Orders   ? ?      Ordered  ?  clindamycin (CLEOCIN) 150 MG capsule  3 times daily       ? 05/24/21 0907  ? ?  ?  ? ?  ? ? ?  ?Deno Etienne, DO ?05/24/21 0911 ? ?

## 2021-05-25 ENCOUNTER — Other Ambulatory Visit: Payer: Self-pay

## 2021-05-26 ENCOUNTER — Telehealth: Payer: Self-pay

## 2021-05-26 NOTE — Telephone Encounter (Signed)
Pt is requesting for work note to be extended to 3/19 due to mouth still swollen. ?

## 2021-05-26 NOTE — Telephone Encounter (Signed)
Letter is ready.

## 2021-05-26 NOTE — Telephone Encounter (Signed)
Letter has been paced up front for pick up. ?

## 2021-06-05 ENCOUNTER — Other Ambulatory Visit: Payer: Self-pay | Admitting: Internal Medicine

## 2021-06-05 ENCOUNTER — Ambulatory Visit: Payer: Self-pay | Admitting: Internal Medicine

## 2021-06-05 ENCOUNTER — Other Ambulatory Visit: Payer: Self-pay

## 2021-06-06 ENCOUNTER — Other Ambulatory Visit: Payer: Self-pay

## 2021-06-06 MED ORDER — LOSARTAN POTASSIUM 25 MG PO TABS
25.0000 mg | ORAL_TABLET | Freq: Every day | ORAL | 2 refills | Status: DC
  Filled 2021-06-06 – 2021-07-10 (×3): qty 30, 30d supply, fill #0

## 2021-06-13 ENCOUNTER — Other Ambulatory Visit: Payer: Self-pay

## 2021-06-21 ENCOUNTER — Other Ambulatory Visit: Payer: Self-pay

## 2021-06-28 ENCOUNTER — Other Ambulatory Visit: Payer: Self-pay

## 2021-07-10 ENCOUNTER — Other Ambulatory Visit: Payer: Self-pay

## 2021-08-13 NOTE — Progress Notes (Signed)
   Established Patient Office Visit  Subjective   Patient ID: [name], male    DOB: 10-31-1975  Age: 46 y.o. MRN: [id number]  No chief complaint on file.   HPI  {History (Optional):23778}  ROS    Objective:     There were no vitals taken for this visit. {Vitals History (Optional):23777}  Physical Exam   No results found for any visits on 08/14/21.  {Labs (Optional):23779}  The ASCVD Risk score (Arnett DK, et al., 2019) failed to calculate for the following reasons:   The valid total cholesterol range is 130 to 320 mg/dL    Assessment & Plan:   Problem List Items Addressed This Visit   None   No follow-ups on file.    Asencion Noble, MD

## 2021-08-14 ENCOUNTER — Ambulatory Visit: Payer: Medicaid Other | Attending: Critical Care Medicine | Admitting: Critical Care Medicine

## 2021-08-14 ENCOUNTER — Other Ambulatory Visit: Payer: Self-pay

## 2021-08-14 ENCOUNTER — Encounter: Payer: Self-pay | Admitting: Critical Care Medicine

## 2021-08-14 VITALS — BP 165/123 | HR 75 | Wt 207.4 lb

## 2021-08-14 DIAGNOSIS — Z1159 Encounter for screening for other viral diseases: Secondary | ICD-10-CM | POA: Diagnosis not present

## 2021-08-14 DIAGNOSIS — M542 Cervicalgia: Secondary | ICD-10-CM

## 2021-08-14 DIAGNOSIS — Z87891 Personal history of nicotine dependence: Secondary | ICD-10-CM

## 2021-08-14 DIAGNOSIS — Z1211 Encounter for screening for malignant neoplasm of colon: Secondary | ICD-10-CM | POA: Diagnosis not present

## 2021-08-14 DIAGNOSIS — I10 Essential (primary) hypertension: Secondary | ICD-10-CM | POA: Diagnosis not present

## 2021-08-14 MED ORDER — DICLOFENAC SODIUM 1 % EX GEL
2.0000 g | Freq: Four times a day (QID) | CUTANEOUS | 2 refills | Status: DC
  Filled 2021-08-14: qty 100, 12d supply, fill #0

## 2021-08-14 MED ORDER — LOSARTAN POTASSIUM 100 MG PO TABS
100.0000 mg | ORAL_TABLET | Freq: Every day | ORAL | 2 refills | Status: DC
  Filled 2021-08-14: qty 30, 30d supply, fill #0
  Filled 2021-09-20: qty 30, 30d supply, fill #1

## 2021-08-14 MED ORDER — METHOCARBAMOL 500 MG PO TABS
500.0000 mg | ORAL_TABLET | Freq: Three times a day (TID) | ORAL | 1 refills | Status: DC | PRN
  Filled 2021-08-14: qty 30, 10d supply, fill #0
  Filled 2021-09-20: qty 30, 10d supply, fill #1

## 2021-08-14 MED ORDER — AMLODIPINE BESYLATE 10 MG PO TABS
10.0000 mg | ORAL_TABLET | Freq: Every day | ORAL | 4 refills | Status: DC
  Filled 2021-08-14: qty 30, 30d supply, fill #0
  Filled 2021-09-20: qty 30, 30d supply, fill #1
  Filled 2021-11-09 – 2021-11-29 (×2): qty 30, 30d supply, fill #2
  Filled 2022-01-22 – 2022-03-29 (×4): qty 30, 30d supply, fill #3

## 2021-08-14 MED ORDER — CARVEDILOL 25 MG PO TABS
ORAL_TABLET | Freq: Two times a day (BID) | ORAL | 4 refills | Status: DC
  Filled 2021-08-14: qty 60, 30d supply, fill #0
  Filled 2021-11-09: qty 180, 90d supply, fill #1
  Filled 2021-11-29: qty 60, 30d supply, fill #1
  Filled 2022-03-02 – 2022-03-29 (×4): qty 60, 30d supply, fill #2

## 2021-08-14 MED ORDER — SPIRONOLACTONE 50 MG PO TABS
50.0000 mg | ORAL_TABLET | Freq: Every day | ORAL | 4 refills | Status: DC
  Filled 2021-08-14: qty 30, 30d supply, fill #0
  Filled 2021-09-20: qty 30, 30d supply, fill #1
  Filled 2021-11-09 – 2021-11-29 (×2): qty 30, 30d supply, fill #2
  Filled 2022-01-22 – 2022-03-29 (×4): qty 30, 30d supply, fill #3

## 2021-08-14 NOTE — Assessment & Plan Note (Signed)
Chronic neck pain and low back pain plan is to use Voltaren gel and methocarbamol refill

## 2021-08-14 NOTE — Assessment & Plan Note (Signed)
Not currently smoking

## 2021-08-14 NOTE — Assessment & Plan Note (Signed)
Hypertension not well controlled Plan to increase Aldactone to 50 mg daily and increase amlodipine to 10 mg daily  Check metabolic panel  No other blood pressure medicine changes  Return in short-term with clinical pharmacy

## 2021-08-14 NOTE — Patient Instructions (Addendum)
Refill on methocarbamol sent to our pharmacy for muscle spasm  Increase losartan to 100 mg daily refill sent to pharmacy  Stay on amlodipine 10 mg daily  Stay on Aldactone 25 mg daily  Stay on carvedilol twice daily 25 mg  Labs today include a metabolic panel and a fecal occult kit will be issued for colon cancer screening we will also screen you for hepatitis C  Return to see Dr. Wynetta Emery in 2 months  Return to see Lurena Joiner our clinical pharmacist 3 weeks you have seen him previously for your blood pressure  Follow a healthy diet we gave you the lifestyle medicine handout today focus on the diet and walking for 30 minutes 4-5 times a week

## 2021-08-15 LAB — BASIC METABOLIC PANEL
BUN/Creatinine Ratio: 8 — ABNORMAL LOW (ref 9–20)
BUN: 8 mg/dL (ref 6–24)
CO2: 26 mmol/L (ref 20–29)
Calcium: 9.3 mg/dL (ref 8.7–10.2)
Chloride: 106 mmol/L (ref 96–106)
Creatinine, Ser: 1.03 mg/dL (ref 0.76–1.27)
Glucose: 91 mg/dL (ref 70–99)
Potassium: 4.2 mmol/L (ref 3.5–5.2)
Sodium: 142 mmol/L (ref 134–144)
eGFR: 90 mL/min/{1.73_m2} (ref >59)

## 2021-08-15 LAB — HCV INTERPRETATION

## 2021-08-15 LAB — HCV AB W REFLEX TO QUANT PCR: HCV Ab: NONREACTIVE

## 2021-08-16 ENCOUNTER — Other Ambulatory Visit: Payer: Self-pay

## 2021-08-17 ENCOUNTER — Telehealth: Payer: Self-pay

## 2021-08-17 NOTE — Telephone Encounter (Signed)
-----   Message from Elsie Stain, MD sent at 08/15/2021  6:21 AM EDT ----- Let pt know kidney normal, hep C neg

## 2021-08-17 NOTE — Telephone Encounter (Signed)
Pt was called and no vm was left due to mailbox being full. Information has been sent to nurse pool.  

## 2021-08-30 ENCOUNTER — Telehealth: Payer: Self-pay | Admitting: Pharmacist

## 2021-08-30 NOTE — Telephone Encounter (Signed)
Patient attempted to be outreached by Tanja Port, PharmD Candidate on 08/30/21 to discuss hypertension. Patient requested a call back tomorrow afternoon.    Catie Hedwig Morton, PharmD, [facility] [phone]

## 2021-08-31 ENCOUNTER — Other Ambulatory Visit: Payer: Self-pay | Admitting: Pharmacist

## 2021-08-31 NOTE — Chronic Care Management (AMB) (Signed)
Patient appearing on report for [employer] [facility] - Hypertension Control report due to last documented ambulatory blood pressure of 165/123 on 08/14/21. Next appointment with PCP is 10/12/21   Outreached patient to discuss hypertension control and medication management.   Has been walking and lifting more at work lately, so is losing weight. 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 He previously had [employer] but is thinking about reapplying. Notes he needs some dental care, and mentioned some issues with his feet, but currently cannot afford specialists.   Current antihypertensives: losartan 100 mg daily, carvedilol 25 mg twice daily, amlodipine 10 mg daily, spironolactone 50 mg daily Previous meds: HCTZ - thinks he was taking BID and the diuretic side effect was too much; clonidine (sedation);   Patient has an automated upper arm home BP machine. He has not checked since getting back on medications  Patient reports hypertensive symptoms, but does indicate some pain/soreness in his arm/leg, but relates it to changing plans at work. It is not related to hypertension   Assessment/Plan: - Currently uncontrolled - - Reviewed goal blood pressure <130/80 - Reviewed appropriate administration of medication regimen - Counseled on long term microvascular and macrovascular complications of uncontrolled hypertension - Reviewed appropriate home BP monitoring technique (avoid caffeine, smoking, and exercise for 30 minutes before checking, rest for at least 5 minutes before taking BP, sit with feet flat on the floor and back against a hard surface, uncross legs, and rest arm on flat surface) - Reviewed to check blood pressure daily, document, and provide at next provider visit  Reviewed upcoming appointment with embedded pharmacist; consider changing losartan to valsartan and/or adding chlorthalidone if continued elevations.   Catie  Hedwig Morton, PharmD, [facility] [phone]

## 2021-08-31 NOTE — Patient Instructions (Signed)
[name],   It was great talking to you today!  Check your blood pressure once daily, and any time you have concerning symptoms like headache, chest pain, dizziness, shortness of breath, or vision changes.   Our goal is less than 130/80.  To appropriately check your blood pressure, make sure you do the following:  1) Avoid caffeine, exercise, or tobacco products for 30 minutes before checking. Empty your bladder. 2) Sit with your back supported in a flat-backed chair. Rest your arm on something flat (arm of the chair, table, etc). 3) Sit still with your feet flat on the floor, resting, for at least 5 minutes.  4) Check your blood pressure. Take 1-2 readings.  5) Write down these readings and bring with you to any provider appointments.  Bring your home blood pressure machine with you to a provider's office for accuracy comparison at least once a year.   Make sure you take your blood pressure medications before you come to any office visit, even if you were asked to fast for labs.   [name], PharmD, [facility] [phone]

## 2021-09-20 ENCOUNTER — Other Ambulatory Visit: Payer: Self-pay

## 2021-09-21 ENCOUNTER — Ambulatory Visit: Payer: Self-pay | Admitting: Pharmacist

## 2021-09-22 ENCOUNTER — Other Ambulatory Visit: Payer: Self-pay

## 2021-09-25 ENCOUNTER — Ambulatory Visit: Payer: Self-pay | Admitting: Pharmacist

## 2021-09-25 NOTE — Progress Notes (Unsigned)
   S:    [name] is a 46 y.o. male who presents for hypertension evaluation, education, and management.  PMH is significant for HTN, prediabetes, chronic neck/back pain.  Patient was referred and last seen by Dr. Joya Gaskins, on 08/14/21. PCP is Dr. Wynetta Emery. At last visit, BP was 165/123 and losartan dose was increased from '25mg'$  daily to '100mg'$  daily. Patient reported that he had been out of medications for several days.   Today, patient arrives in *** spirits and presents without *** assistance. *** Denies dizziness, headache, blurred vision, swelling.   Patient reports hypertension was diagnosed in 2003.   Family/Social history: former smoker (quit 2017), uninsured  Medication adherence *** . Patient has *** taken BP medications today.   Current antihypertensives include: amlodipine '10mg'$  daily, carvedilol '25mg'$  BID, losartan '100mg'$  daily, spironolactone '50mg'$  daily   *normal renin + aldosterone levels back in 2018, valsartan?  Antihypertensives tried in the past include: clonidine, hydralazine, HCTZ, lisinopril   Reported home BP readings: ***  Patient reported dietary habits:   Patient-reported exercise habits: ***  ASCVD risk factors include: HTN  O:   Last 3 Office BP readings: BP Readings from Last 3 Encounters:  08/14/21 (!) 165/123  05/24/21 (!) 164/124  04/12/21 (!) 164/108    BMET    Component Value Date/Time   NA 142 08/14/2021 1633   K 4.2 08/14/2021 1633   CL 106 08/14/2021 1633   CO2 26 08/14/2021 1633   GLUCOSE 91 08/14/2021 1633   GLUCOSE 123 (H) 08/28/2020 1557   BUN 8 08/14/2021 1633   CREATININE 1.03 08/14/2021 1633   CREATININE 0.97 12/12/2015 1306   CALCIUM 9.3 08/14/2021 1633   GFRNONAA >60 08/28/2020 1557   GFRNONAA >89 12/12/2015 1306   GFRAA 91 01/29/2019 1044   GFRAA >89 12/12/2015 1306    Renal function: CrCl cannot be calculated (Patient's most recent lab result is older than the maximum 21 days allowed.).  Clinical ASCVD: No  The  ASCVD Risk score (Arnett DK, et al., 2019) failed to calculate for the following reasons:   The valid total cholesterol range is 130 to 320 mg/dL  A/P: Hypertension diagnosed *** currently *** on current medications. BP goal < 130/80 *** mmHg. Medication adherence appears ***. Control is suboptimal due to ***.  -{Meds adjust:18428} ***.  -Patient educated on purpose, proper use, and potential adverse effects of ***.  -F/u labs ordered - *** -Counseled on lifestyle modifications for blood pressure control including reduced dietary sodium, increased exercise, adequate sleep. -Encouraged patient to check BP at home and bring log of readings to next visit. Counseled on proper use of home BP cuff.    Results reviewed and written information provided.    Written patient instructions provided. Patient verbalized understanding of treatment plan.  Total time in face to face counseling *** minutes.    Follow-up:  Pharmacist ***. PCP clinic visit in ***.  Patient seen with ***.

## 2021-10-12 ENCOUNTER — Ambulatory Visit: Payer: Self-pay | Admitting: Internal Medicine

## 2021-10-18 ENCOUNTER — Other Ambulatory Visit: Payer: Self-pay

## 2021-10-18 NOTE — Progress Notes (Signed)
Patient appearing on report for [facility] - Hypertension Control report due to last documented ambulatory blood pressure of 165/123 on 08/14/2021. Next appointment with PCP is not yet scheduled.    Outreached patient to discuss hypertension control and medication management.   Patient recently starting working part time at [employer] which provides insurance to part time workers. However, he expresses concern for how much they would take out of his check.   Current antihypertensives: amlodipine 10 mg, carvedilol 25 mg, losartan 100 mg, spironolactone 50 mg  Patient has an automated upper arm home BP machine, however not checking.  Current blood pressure readings: Not checking. Agreeable to start checking at least once a week.   Patient denies hypotensive signs and symptoms including dizziness, lightheadedness.  Patient reports hypertensive symptoms including headaches. Denies chest pain, shortness of breath.  Patient denies side effects related to his medications.    Assessment/Plan: - Currently uncontrolled based on last BP reading -Check blood pressure at least once a week - Reviewed goal blood pressure <130/80 - Reviewed appropriate administration of medication regimen - Counseled on long term microvascular and macrovascular complications of uncontrolled hypertension - Reviewed strategies to improve medication adherence such as pill boxes, phone reminders, sticky notes.  Joseph Art, Pharm.D. PGY-2 Ambulatory Care Pharmacy Resident 10/18/2021 2:37 PM

## 2021-10-30 ENCOUNTER — Other Ambulatory Visit: Payer: Self-pay | Admitting: Pharmacist

## 2021-10-30 ENCOUNTER — Other Ambulatory Visit: Payer: Self-pay

## 2021-10-30 ENCOUNTER — Encounter: Payer: Self-pay | Admitting: Pharmacist

## 2021-10-30 ENCOUNTER — Ambulatory Visit: Payer: Medicaid Other | Attending: Internal Medicine | Admitting: Pharmacist

## 2021-10-30 VITALS — BP 127/80 | HR 62

## 2021-10-30 DIAGNOSIS — I10 Essential (primary) hypertension: Secondary | ICD-10-CM | POA: Diagnosis not present

## 2021-10-30 MED ORDER — VALSARTAN 320 MG PO TABS
320.0000 mg | ORAL_TABLET | Freq: Every day | ORAL | 2 refills | Status: DC
  Filled 2021-10-30: qty 30, 30d supply, fill #0
  Filled 2021-11-09: qty 90, 90d supply, fill #0
  Filled 2021-11-29: qty 30, 30d supply, fill #0

## 2021-10-30 NOTE — Progress Notes (Signed)
   S:     No chief complaint on file.  [name] is a 46 y.o. male who presents for hypertension evaluation, education, and management. PMH is significant for HTN. Patient was referred by Dr. Joya Gaskins, on 08/14/2021. Last seen by CPP on 01/27/2021.  Today, patient arrives in spirits and presents without assistance. Denies dizziness, headache, blurred vision, swelling. States he did have a BP cuff at home but no longer works. He is willing to purchase one and keep a BP log. States his BP may be elevated as he got irritated in the parking lot.   Medication adherence. Patient has taken BP medications today.   Current antihypertensives include: amlodipine 10 mg, carvedilol 25 mg BID, losartan 100 mg daily, spironolactone 50 mg daily   Antihypertensives tried in the past include: HCTZ (DC'd due to diuretic side effect was too much), clonidine (DC'd due to sedation)   Reported home BP readings: not checking. Machine no longer works.   O:  Vitals:   10/30/21 1442 10/30/21 1447  BP: (!) 141/87 127/80  Pulse: 62    Last 3 Office BP readings: BP Readings from Last 3 Encounters:  10/30/21 127/80  08/14/21 (!) 165/123  05/24/21 (!) 164/124    BMET    Component Value Date/Time   NA 142 08/14/2021 1633   K 4.2 08/14/2021 1633   CL 106 08/14/2021 1633   CO2 26 08/14/2021 1633   GLUCOSE 91 08/14/2021 1633   GLUCOSE 123 (H) 08/28/2020 1557   BUN 8 08/14/2021 1633   CREATININE 1.03 08/14/2021 1633   CREATININE 0.97 12/12/2015 1306   CALCIUM 9.3 08/14/2021 1633   GFRNONAA >60 08/28/2020 1557   GFRNONAA >89 12/12/2015 1306   GFRAA 91 01/29/2019 1044   GFRAA >89 12/12/2015 1306   A/P: Hypertension diagnosed. Was initially above goal but r/p BP was at goal on current medications. BP goal < 130/80 mmHg. Medication adherence appears appropriate. Repeat BP at goal, first elevated BP attributed to irritation. Control is suboptimal due to emotional triggers.  -Discontinued losartan. -Start  valsartan 320 mg daily for longer duration of action compared to losartan.  -Patient educated on purpose, proper use, and potential adverse effects of valsartan.  -Counseled on lifestyle modifications for blood pressure control including reduced dietary sodium, increased exercise, adequate sleep. -Encouraged patient to check BP at home and bring log of readings to next visit. Counseled on proper use of home BP cuff.    Results reviewed and written information provided.    Total time in face to face counseling 25 minutes.    Follow-up:  PCP clinic visit in 1 month.   Joseph Art, Pharm.D. PGY-2 Ambulatory Care Pharmacy Resident 10/30/2021 3:22 PM

## 2021-11-06 ENCOUNTER — Other Ambulatory Visit: Payer: Self-pay

## 2021-11-09 ENCOUNTER — Other Ambulatory Visit: Payer: Self-pay

## 2021-11-15 ENCOUNTER — Other Ambulatory Visit: Payer: Self-pay

## 2021-11-29 ENCOUNTER — Other Ambulatory Visit: Payer: Self-pay

## 2021-11-30 ENCOUNTER — Other Ambulatory Visit: Payer: Self-pay

## 2021-11-30 ENCOUNTER — Other Ambulatory Visit: Payer: Self-pay | Admitting: Critical Care Medicine

## 2021-11-30 DIAGNOSIS — M542 Cervicalgia: Secondary | ICD-10-CM

## 2021-11-30 MED ORDER — METHOCARBAMOL 500 MG PO TABS
500.0000 mg | ORAL_TABLET | Freq: Three times a day (TID) | ORAL | 0 refills | Status: DC | PRN
  Filled 2021-11-30 – 2022-01-22 (×5): qty 30, 10d supply, fill #0

## 2021-12-04 ENCOUNTER — Ambulatory Visit: Payer: Self-pay | Admitting: Internal Medicine

## 2021-12-06 ENCOUNTER — Other Ambulatory Visit: Payer: Self-pay

## 2021-12-08 ENCOUNTER — Other Ambulatory Visit (HOSPITAL_COMMUNITY): Payer: Self-pay

## 2021-12-08 ENCOUNTER — Ambulatory Visit: Payer: Medicaid Other | Attending: Internal Medicine | Admitting: Internal Medicine

## 2021-12-08 ENCOUNTER — Other Ambulatory Visit: Payer: Self-pay

## 2021-12-08 ENCOUNTER — Encounter: Payer: Self-pay | Admitting: Internal Medicine

## 2021-12-08 VITALS — BP 138/82 | HR 72 | Temp 98.3°F | Ht 75.0 in | Wt 204.6 lb

## 2021-12-08 DIAGNOSIS — Z87898 Personal history of other specified conditions: Secondary | ICD-10-CM

## 2021-12-08 DIAGNOSIS — E559 Vitamin D deficiency, unspecified: Secondary | ICD-10-CM

## 2021-12-08 DIAGNOSIS — G4709 Other insomnia: Secondary | ICD-10-CM | POA: Diagnosis not present

## 2021-12-08 DIAGNOSIS — Z1211 Encounter for screening for malignant neoplasm of colon: Secondary | ICD-10-CM

## 2021-12-08 DIAGNOSIS — I10 Essential (primary) hypertension: Secondary | ICD-10-CM

## 2021-12-08 DIAGNOSIS — R634 Abnormal weight loss: Secondary | ICD-10-CM | POA: Diagnosis not present

## 2021-12-08 DIAGNOSIS — R519 Headache, unspecified: Secondary | ICD-10-CM | POA: Diagnosis not present

## 2021-12-08 DIAGNOSIS — Z2821 Immunization not carried out because of patient refusal: Secondary | ICD-10-CM

## 2021-12-08 MED ORDER — LOSARTAN POTASSIUM 100 MG PO TABS
100.0000 mg | ORAL_TABLET | Freq: Every day | ORAL | 6 refills | Status: DC
  Filled 2021-12-08 – 2022-03-29 (×6): qty 30, 30d supply, fill #0

## 2021-12-08 NOTE — Patient Instructions (Addendum)
Restart losartan 100 mg daily.  Good sleep hygiene discussed and encouraged. do not to drink any caffeinated beverages or excessive alcohol use within several hours of bedtime.  Get in bed around about the same time every night.  Once in bed, turn off all lights and sounds.  If unable to fall asleep within 30 to 45 minutes of getting in bed, you should get up and try to do something until you feels sleepy again.  At that time try getting back in bed.  Please remember to do the stool kit for colon cancer screening and turn it into the lab as soon as possible.

## 2021-12-08 NOTE — Progress Notes (Signed)
Patient ID: [name], male    DOB: 11-Aug-1975  MRN: [id number]  CC: Hypertension (HTN f/u. Med concern, having headaches, fatigue, tightness in legs X2 weeks. )   Subjective: [name] is a 46 y.o. male who presents for chronic ds management His concerns today include:  Hx of poorly HTN, tob dep, poor dentition, chronic lower back pain, history of reflex syncope, callus of left Achilles tendon.  HTN:  Cozaar changed to Diovan by clinical pharmacist 10/30/2021. He stopped taking it 2 days ago.  Reports he does not feel good on the medication and was causing HA.  He also felt that it was causing fatigue in his legs.  This has decreased some since he stopped taking the medication.  Had to leave work early a few days due to not feeling well.   -limiting salt in foods Has not check BP in 2 wks.   History of prediabetes: Last A1C 6.2.  Weight is down 13 pounds since October of last year.  He reports that he does a lot of walking at work.  Started working for [employer] in January of this year.  Other contributing factors he feel has to do with his teeth.  Has a lot of pain in his gum due to cavities.  Some of his teeth have fallen out.  He is unable to chew certain foods especially meats.  He hopes to have insurance by the first of the year through his employer.  Plans to see a dentist at that time.  He feels he does not sleep well.  He works shifts at [employer].  When he gets off work, he is up late into the early mornings playing video games. HM:  Declines flu shot.  Still has FIT test at home.   History of vitamin D deficiency.  Taking vitamin D supplement over-the-counter regularly up until a few months ago.  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 90 tablet 4   Biotin 10000 MCG TABS Take by mouth.     carvedilol (COREG) 25 MG tablet TAKE 1 TABLET (25 MG TOTAL) BY MOUTH 2 (TWO) TIMES DAILY WITH A MEAL. 120 tablet 4   diclofenac Sodium (VOLTAREN) 1 % GEL Apply 2 g topically 4 (four) times daily. 100 g 2   methocarbamol (ROBAXIN) 500 MG tablet Take 1 tablet (500 mg total) by mouth every 8 (eight) hours as needed for muscle spasms. 30 tablet 0   spironolactone (ALDACTONE) 50 MG tablet Take 1 tablet (50 mg total) by mouth daily. 30 tablet 4   cholecalciferol (VITAMIN D3) 25 MCG (1000 UNIT) tablet Take 1,000 Units by mouth daily. (Patient not taking: Reported on 12/08/2021)     No current facility-administered medications on file prior to visit.    Allergies  Allergen Reactions   Penicillins Swelling and  Rash    Has patient had a PCN reaction causing immediate rash, facial/tongue/throat swelling, SOB or lightheadedness with hypotension: Yes/No:[id number] Yes Has patient had a PCN reaction causing severe rash involving mucus membranes or skin necrosis: NoNo Has patient had a PCN reaction that required hospitalization Yes/No:[id number] Yes Has patient had a PCN reaction occurring within the last 10 years: Yes/No:[id number] No If all of the above answers are "NO", then may proceed with Cephalospori    Social History   Socioeconomic History   Marital status: Single    Spouse name: Not on file   Number of children: Not on file   Years of education: Not on file   Highest education level: Not on file  Occupational History   Not on file   Tobacco Use   Smoking status: Former    Packs/day: 1.00    Types: Cigars, Cigarettes    Quit date: 05/10/2015    Years since quitting: 6.5   Smokeless tobacco: Never  Vaping Use   Vaping Use: Never used  Substance and Sexual Activity   Alcohol use: Yes    Comment: occ   Drug use: No   Sexual activity: Not on file  Other Topics Concern   Not on file  Social History Narrative   Not on file   Social Determinants of Health   Financial Resource Strain: Not on file  Food Insecurity: Not on file  Transportation Needs: Not on file  Physical Activity: Not on file  Stress: Not on file  Social Connections: Not on file  Intimate Partner Violence: Not on file    Family History  Problem Relation Age of Onset   Cancer Mother     Past Surgical History:  Procedure Laterality Date   CHOLECYSTECTOMY      ROS: Review of Systems Negative except as stated above  PHYSICAL EXAM: BP 138/82 (BP Location: Left Arm, Patient Position: Sitting, Cuff Size: Large)   Pulse 72   Temp 98.3 F (36.8 C) (Oral)   Ht '6\' 3"'$  (1.905 m)   Wt 204 lb 9.6 oz (92.8 kg)   SpO2 97%   BMI 25.57 kg/m   Wt Readings from Last 3 Encounters:  12/08/21 204 lb 9.6 oz (92.8 kg)  08/14/21 207 lb 6.4 oz (94.1 kg)  04/12/21 212 lb (96.2 kg)    Physical Exam  General appearance - alert, well appearing, and in no distress Mental status - normal mood, behavior, speech, dress, motor activity, and thought processes Neck - supple, no significant adenopathy Mouth: Patient has several teeth missing at the front of the upper jaw.  He has quite a number of decayed teeth some of which have broken off in the gum. Chest - clear to auscultation, no wheezes, rales or rhonchi, symmetric air entry Heart - normal rate, regular rhythm, normal S1, S2, no murmurs, rubs, clicks or gallops Extremities - peripheral pulses normal, no pedal edema, no clubbing or cyanosis Dorsalis pedis, posterior tibialis and popliteal pulses are 3+  bilaterally.     Latest Ref Rng & Units 08/14/2021    4:33 PM 01/27/2021   11:33 AM 11/17/2020    3:24 PM  CMP  Glucose 70 - 99 mg/dL 91  128  126   BUN 6 - 24 mg/dL '8  10  13   '$ Creatinine 0.76 - 1.27 mg/dL 1.03  1.05  0.93   Sodium 134 - 144 mmol/L 142  138  139   Potassium 3.5 - 5.2 mmol/L 4.2  4.1  4.0  Chloride 96 - 106 mmol/L 106  98  102   CO2 20 - 29 mmol/L '26  25  23   '$ Calcium 8.7 - 10.2 mg/dL 9.3  9.7  9.1    Lipid Panel     Component Value Date/Time   CHOL 129 10/19/2020 1136   TRIG 114 10/19/2020 1136   HDL 39 (L) 10/19/2020 1136   CHOLHDL 3.3 10/19/2020 1136   LDLCALC 69 10/19/2020 1136    CBC    Component Value Date/Time   WBC 7.0 08/28/2020 1557   RBC 5.76 08/28/2020 1557   HGB 17.4 (H) 08/28/2020 1557   HGB 17.9 (H) 01/29/2019 1044   HCT 51.0 08/28/2020 1557   HCT 50.5 01/29/2019 1044   PLT 250 08/28/2020 1557   PLT 343 01/29/2019 1044   MCV 88.5 08/28/2020 1557   MCV 84 01/29/2019 1044   MCH 30.2 08/28/2020 1557   MCHC 34.1 08/28/2020 1557   RDW 13.5 08/28/2020 1557   RDW 13.1 01/29/2019 1044   LYMPHSABS 0.9 08/28/2020 1557   LYMPHSABS 3.9 (H) 08/14/2017 1630   MONOABS 0.7 08/28/2020 1557   EOSABS 0.1 08/28/2020 1557   EOSABS 0.2 08/14/2017 1630   BASOSABS 0.0 08/28/2020 1557   BASOSABS 0.1 08/14/2017 1630    ASSESSMENT AND PLAN:  1. Essential hypertension Not at goal.  Patient not tolerating Diovan.  We will have him stop the Diovan and change back to Cozaar.  Continue amlodipine 10 mg daily, carvedilol 25 mg twice a day and spironolactone. - losartan (COZAAR) 100 MG tablet; Take 1 tablet (100 mg total) by mouth daily.  Dispense: 30 tablet; Refill: 6 - Basic Metabolic Panel - CBC  2. Acute nonintractable headache, unspecified headache type -Patient feels this was due to Diovan.  Headaches resolved once he stopped the medication.  3. Other insomnia Good sleep hygiene discussed and encouraged. Patient advised not to drink any caffeinated  beverages or excessive alcohol use within several hours of bedtime.  Advised to get in bed around about the same time every night.  Once in bed, turn off all lights and sounds.  If unable to fall asleep within 30 to 45 minutes of getting in bed, patient should get up and try to do something until he feels sleepy again.  At that time try getting back in bed. Advised against staying up late at nights playing video games.  Recommend that he tries to wind down at least an hour or so before bedtime  4. Weight loss, unintentional Likely due to increased activity on his new job and the fact that he is not able to eat certain foods anymore due to severe gum issues.  He plans to see a dentist as soon as he gets insurance through his new employer. - TSH+T4F+T3Free  5. Vitamin D deficiency We were supposed to check vitamin D level today but I forgot to add this to his lab.  We will plan to check it on his next visit - CBC  6. History of prediabetes - Hemoglobin A1c  7. Influenza vaccination declined Recommended.  Patient declined.  8. Screening for colon cancer Patient still has fit test at home.  Encouraged him to use it and turn it in for colon cancer screening.  He promises to do so.    Patient was given the opportunity to ask questions.  Patient verbalized understanding of the plan and was able to repeat key elements of the plan.   This documentation was completed using Dragon voice  Programmer, applications.  Any transcriptional errors are unintentional.  Orders Placed This Encounter  Procedures   [id number]   Basic Metabolic Panel   CBC   Hemoglobin A1c     Requested Prescriptions   Signed Prescriptions Disp Refills   losartan (COZAAR) 100 MG tablet 30 tablet 6    Sig: Take 1 tablet (100 mg total) by mouth daily.    Return in about 4 months (around 04/09/2022).  Karle Plumber, MD, FACP

## 2021-12-09 LAB — HEMOGLOBIN A1C
Est. average glucose Bld gHb Est-mCnc: 123 mg/dL
Hgb A1c MFr Bld: 5.9 % — ABNORMAL HIGH (ref 4.8–5.6)

## 2021-12-09 LAB — TSH+T4F+T3FREE
Free T4: 1.06 ng/dL (ref 0.82–1.77)
T3, Free: 4.1 pg/mL (ref 2.0–4.4)
TSH: 0.564 u[IU]/mL (ref 0.450–4.500)

## 2021-12-09 LAB — CBC
Hematocrit: 50.7 % (ref 37.5–51.0)
Hemoglobin: 17.1 g/dL (ref 13.0–17.7)
MCH: 29 pg (ref 26.6–33.0)
MCHC: 33.7 g/dL (ref 31.5–35.7)
MCV: 86 fL (ref 79–97)
Platelets: 302 10*3/uL (ref 150–450)
RBC: 5.9 x10E6/uL — ABNORMAL HIGH (ref 4.14–5.80)
RDW: 14.5 % (ref 11.6–15.4)
WBC: 8.1 10*3/uL (ref 3.4–10.8)

## 2021-12-09 LAB — BASIC METABOLIC PANEL
BUN/Creatinine Ratio: 17 (ref 9–20)
BUN: 16 mg/dL (ref 6–24)
CO2: 22 mmol/L (ref 20–29)
Calcium: 10.1 mg/dL (ref 8.7–10.2)
Chloride: 104 mmol/L (ref 96–106)
Creatinine, Ser: 0.93 mg/dL (ref 0.76–1.27)
Glucose: 114 mg/dL — ABNORMAL HIGH (ref 70–99)
Potassium: 4.5 mmol/L (ref 3.5–5.2)
Sodium: 140 mmol/L (ref 134–144)
eGFR: 102 mL/min/{1.73_m2} (ref >59)

## 2021-12-14 ENCOUNTER — Other Ambulatory Visit: Payer: Self-pay

## 2021-12-20 ENCOUNTER — Other Ambulatory Visit: Payer: Self-pay

## 2022-01-22 ENCOUNTER — Ambulatory Visit: Payer: Self-pay | Admitting: *Deleted

## 2022-01-22 ENCOUNTER — Other Ambulatory Visit: Payer: Self-pay

## 2022-01-22 NOTE — Telephone Encounter (Signed)
Reason for Disposition  [1] Chest pain (or "angina") comes and goes AND [2] is happening more often (increasing in frequency) or getting worse (increasing in severity)  (Exception: Chest pains that last only a few seconds.)  Answer Assessment - Initial Assessment Questions 1. LOCATION: "Where does it hurt?"       *No Answer*Chest pain about my heart area.   I don't know if it's muscular or vascular.   Last night it bothered me pretty good.   On the left hand side of my chest.   My arm is not hurting. I had an irregular heart beat and was in the hospital at [facility]. My heart was enlarged.     My neck is hurting a little.   I have regular back pain upper and lower.   2. RADIATION: "Does the pain go anywhere else?" (e.g., into neck, jaw, arms, back)     My neck area on left.    3. ONSET: "When did the chest pain begin?" (Minutes, hours or days)      My breath is down a little.   I'm winded a little.    It started a while ago.   At times it feel muscular and then when it comes back it really bothers me.   I'm driving right now and it's not bothering me.    4. PATTERN: "Does the pain come and go, or has it been constant since it started?"  "Does it get worse with exertion?"      It comes and goes and has been going on for a while. 5. DURATION: "How long does it last" (e.g., seconds, minutes, hours)     It bothers me worse in the mornings and at night.   Last night it bothered me pretty good all night so I did not get much sleep. 6. SEVERITY: "How bad is the pain?"  (e.g., Scale 1-10; mild, moderate, or severe)    - MILD (1-3): doesn't interfere with normal activities     - MODERATE (4-7): interferes with normal activities or awakens from sleep    - SEVERE (8-10): excruciating pain, unable to do any normal activities       Mild 7. CARDIAC RISK FACTORS: "Do you have any history of heart problems or risk factors for heart disease?" (e.g., angina, prior heart attack; diabetes, high blood pressure, high  cholesterol, smoker, or strong family history of heart disease)     Was in the hospital for an irregular heart beat a few years ago and they mentioned something about my heart being enlarged. 8. PULMONARY RISK FACTORS: "Do you have any history of lung disease?"  (e.g., blood clots in lung, asthma, emphysema, birth control pills)     No 9. CAUSE: "What do you think is causing the chest pain?"     I don't know if it's muscular or vascular related. 10. OTHER SYMPTOMS: "Do you have any other symptoms?" (e.g., dizziness, nausea, vomiting, sweating, fever, difficulty breathing, cough)       A little short of breath, fluttering in his chest at times.    No dizziness or breaking out into sweats. 11. PREGNANCY: "Is there any chance you are pregnant?" "When was your last menstrual period?"       N/A  Protocols used: Chest Pain-A-AH

## 2022-01-22 NOTE — Telephone Encounter (Signed)
  Chief Complaint: chest pain on left side of chest.   No radiation.    Symptoms: shortness of breath, Frequency: intermittent Pertinent Negatives: Patient denies sweating or dizziness Disposition: '[x]'$ ED /'[]'$ Urgent Care (no appt availability in office) / '[]'$ Appointment(In office/virtual)/ '[]'$  [facility]/ '[]'$ Home Care/ '[]'$ Refused Recommended Disposition /'[]'$ [facility]/ '[]'$  Follow-up with PCP Additional Notes: Agreeable to going to the ED

## 2022-01-29 ENCOUNTER — Other Ambulatory Visit: Payer: Self-pay

## 2022-02-21 ENCOUNTER — Telehealth: Payer: Self-pay | Admitting: Emergency Medicine

## 2022-02-21 NOTE — Telephone Encounter (Signed)
Copied from [place] [id number]. Topic: General - Other >> Feb 21, 2022 12:02 PM [place] B wrote: Reason for CRM: patient called in requesting lab orders and lab for std

## 2022-02-27 NOTE — Telephone Encounter (Signed)
Called and no answer. Unable to LVM due to VM not set up.

## 2022-02-28 NOTE — Telephone Encounter (Signed)
Called & no answer. Unable to LVM due to VM not set up.

## 2022-03-02 ENCOUNTER — Other Ambulatory Visit: Payer: Self-pay

## 2022-03-02 NOTE — Telephone Encounter (Signed)
Returned patient call. No answer & unable to LVM.

## 2022-03-02 NOTE — Telephone Encounter (Signed)
Patient returning call and states he is available to do a telephone call visit any day prior to 3pm. Please confirm with patient the date and time of telephone visit

## 2022-03-06 NOTE — Telephone Encounter (Signed)
Patient came in office. Patient was advised to go to the mobile unit today 03/06/2022. Patient expressed verbal understanding.

## 2022-03-09 ENCOUNTER — Other Ambulatory Visit: Payer: Self-pay

## 2022-03-15 ENCOUNTER — Other Ambulatory Visit: Payer: Self-pay

## 2022-03-16 ENCOUNTER — Other Ambulatory Visit: Payer: Self-pay

## 2022-03-23 ENCOUNTER — Other Ambulatory Visit: Payer: Self-pay

## 2022-03-29 ENCOUNTER — Other Ambulatory Visit: Payer: Self-pay

## 2022-04-02 ENCOUNTER — Other Ambulatory Visit: Payer: Self-pay

## 2022-04-03 ENCOUNTER — Ambulatory Visit: Payer: Self-pay

## 2022-04-03 ENCOUNTER — Other Ambulatory Visit (HOSPITAL_COMMUNITY): Payer: Self-pay

## 2022-04-03 ENCOUNTER — Other Ambulatory Visit: Payer: Self-pay

## 2022-04-03 NOTE — Telephone Encounter (Signed)
Patient has been laced on the [facility] schedule.

## 2022-04-03 NOTE — Telephone Encounter (Signed)
Message from Shan Levans sent at 04/03/2022  8:31 AM EST  Summary: Nose Bleeds   Patient states that he has had several heavy nose bleeds over the past week that are hard to stop. Patient states that he is blowing clots out that are the size of his pinky. Patient is scheduled to see pcp on 2/1.         Chief Complaint: nose bleed with clots  Symptoms: soaked 6 tissues and 2 washcloths, BP 170/114 yesterday- pt stated that he was out of BP meds and restarted today, sinus pressure Frequency: Sunday - 2 nose bleeds  Pertinent Negatives: Patient denies lightheadedness Disposition: '[]'$ ED /'[]'$ Urgent Care (no appt availability in office) / '[]'$ Appointment(In office/virtual)/ '[]'$  [facility]/ '[x]'$ Home Care/ '[]'$ Refused Recommended Disposition /'[]'$  Mobile Bus/ '[]'$  Follow-up with PCP Additional Notes: pt would like a sooner appt with PCP- refused mobile bus stating that he only wants to see PCP. Pt mentioned some growth in his right nare that was not taken out or evaluated.   Reason for Disposition  [1] Nosebleed AND [2] bleeding present < 30 minutes AND [3] using correct technique per guideline  Answer Assessment - Initial Assessment Questions 1. AMOUNT OF BLEEDING: "How bad is the bleeding?" "How much blood was lost?" "Has the bleeding stopped?"   - MILD: needed a couple tissues   - MODERATE: needed many tissues   - SEVERE: large blood clots, soaked many tissues, lasted more than 30 minutes      Severe-took 15 minutes- went through 6 tissues 2 washcloths  2. ONSET: "When did the nosebleed start?"      Sunday  3. FREQUENCY: "How many nosebleeds have you had in the last 24 hours?"      2 4. RECURRENT SYMPTOMS: "Have there been other recent nosebleeds?" If Yes, ask: "How long did it take you to stop the bleeding?" "What worked best?"      No other nose bleeds 10-15 minutes- pinching nose 5. CAUSE: "What do you think caused this nosebleed?"     Without BP med 170/114 before meds   6. LOCAL FACTORS: "Do you have any cold symptoms?", "Have you been rubbing or picking at your nose?"     Sinus congestion 7. SYSTEMIC FACTORS: "Do you have high blood pressure or any bleeding problems?"     HTN 8. BLOOD THINNERS: "Do you take any blood thinners?" (e.g., aspirin, clopidogrel / Plavix, coumadin, heparin). Notes: Other strong blood thinners include: Arixtra (fondaparinux), Eliquis (apixaban), Pradaxa (dabigatran), and Xarelto (rivaroxaban).     no 9. OTHER SYMPTOMS: "Do you have any other symptoms?" (e.g., lightheadedness)     No- sinus pressure  10. PREGNANCY: "Is there any chance you are pregnant?" "When was your last menstrual period?"       N/a  Protocols used: Nosebleed-A-AH

## 2022-04-05 ENCOUNTER — Encounter: Payer: Self-pay | Admitting: Physician Assistant

## 2022-04-05 ENCOUNTER — Ambulatory Visit: Payer: Medicaid Other | Attending: Internal Medicine | Admitting: Physician Assistant

## 2022-04-05 VITALS — BP 138/96 | HR 68 | Ht 74.5 in | Wt 205.6 lb

## 2022-04-05 DIAGNOSIS — I10 Essential (primary) hypertension: Secondary | ICD-10-CM

## 2022-04-05 DIAGNOSIS — R040 Epistaxis: Secondary | ICD-10-CM | POA: Diagnosis not present

## 2022-04-05 DIAGNOSIS — Z7982 Long term (current) use of aspirin: Secondary | ICD-10-CM | POA: Diagnosis not present

## 2022-04-05 NOTE — Progress Notes (Signed)
Patient ID: [name], male   DOB: Nov 05, 1975, 47 y.o.   MRN: [id number]   [name], is a 47 y.o. male  JME:[id number]  [id number]:[id number]  DOB - [birth year]  Chief Complaint  Patient presents with   Epistaxis       Subjective:   [name] is a 47 y.o. male here today as a work-in for nose bleeds. Nose bleed after sneezing started 4 days ago.  These have occurred about 2-3 times daily since( about 7 total).  Blood from R nostril.  No recent URI.  Last 10 mins.    He had been off BP meds for about 1 week prior to this starting.  He just resumed BP meds about 4 days ago.  He admits to taking  about 2-3 goody's/BP powders daily for various aches and pains.  Not currently using a nasal spray.  He is concerned he may have a tumor in his R nostril.  No other bleeding.  No melena/hematochezia/bleeding gums, etc.    No problems updated.  ALLERGIES:   PAST MEDICAL HISTORY: Past Medical History:  Diagnosis Date   Hypertension 2003   dx age 39    Wears glasses     MEDICATIONS AT HOME: Prior to Admission medications   Medication Sig Start Date End Date Taking? Authorizing Provider  amLODipine (NORVASC) 10 MG tablet Take 1 tablet (10 mg total) by mouth daily. 08/14/21 08/14/22 Yes Elsie Stain, MD  Biotin 10000 MCG TABS Take by mouth.   Yes [provider]  carvedilol (COREG) 25 MG tablet TAKE 1 TABLET (25 MG TOTAL) BY MOUTH 2 (TWO) TIMES DAILY WITH A MEAL. 08/14/21 08/14/22 Yes Elsie Stain, MD  cholecalciferol (VITAMIN D3) 25 MCG (1000 UNIT) tablet Take 1,000 Units by mouth daily.   Yes [provider]  diclofenac Sodium (VOLTAREN) 1 % GEL Apply 2 g topically 4 (four) times daily. 08/14/21  Yes Elsie Stain, MD  losartan (COZAAR) 100 MG tablet Take 1 tablet (100 mg total) by mouth daily. 12/08/21  Yes Ladell Pier, MD  methocarbamol (ROBAXIN) 500 MG tablet Take 1 tablet (500 mg total) by mouth every 8 (eight) hours as needed for muscle spasms. 11/30/21   Yes Ladell Pier, MD  spironolactone (ALDACTONE) 50 MG tablet Take 1 tablet (50 mg total) by mouth daily. 08/14/21 08/14/22 Yes Elsie Stain, MD    ROS: Neg resp Neg cardiac Neg GI Neg GU Neg MS Neg psych Neg neuro  Objective:   Vitals:   04/05/22 0946 04/05/22 1005  BP: (!) 167/110 (!) 138/96  Pulse: 68   SpO2: 99%   Weight: 205 lb 9.6 oz (93.3 kg)   Height: 6' 2.5" (1.892 m)    Exam General appearance : Awake, alert, not in any distress. Speech Clear. Not toxic looking HEENT: Atraumatic and Normocephalic.  Nose-external nose appears normal.  Turbinates erythematous and enlarged B.  Septum in tact.  No visible scab.  Throat with PND.   Neck: Supple, no JVD. No cervical lymphadenopathy.  Chest: Good air entry bilaterally, CTAB.  No rales/rhonchi/wheezing CVS: S1 S2 regular, no murmurs.  Extremities: B/L Lower Ext shows no edema, both legs are warm to touch Neurology: Awake alert, and oriented X 3, CN II-XII intact, Non focal Skin: No Rash  Data Review Lab Results  Component Value Date   HGBA1C 5.9 (H) 12/08/2021   HGBA1C 6.2 (H) 10/19/2020    Assessment & Plan   1. Epistaxis Likely secondary to  uncontrolled BP/being off meds and daily use of BC powders 1-3 times daily.  Stop aspirin based products for pain.  Take BP meds and check BP regularly.  Cold air humidifier. Saline nasal spray - CBC with Differential/Platelet - PT AND PTT - Ambulatory referral to ENT  2. On aspirin at home Stop daily/excessive use for pain relief.   - Ambulatory referral to ENT  3. Essential hypertension Suboptimal control but only back on meds for about 4 days.      Return for keep appt with Dr Wynetta Emery in february.  The patient was given clear instructions to go to ER or return to medical center if symptoms don't improve, worsen or new problems develop. The patient verbalized understanding. The patient was told to call to get lab results if they haven't heard anything in the  next week.      Freeman Caldron, PA-C [facility] and [facility], [place]   04/05/2022, 12:22 PM

## 2022-04-05 NOTE — Patient Instructions (Addendum)
Stop products containing aspirin for pain relief. Take blood pressure medications as prescribed Drink 80-100 ounces water daily Use vaseline at night in your nostrils   Nosebleed, Adult A nosebleed is when blood comes out of the nose. Nosebleeds are common and can be caused by many things. They are usually not a sign of a serious medical problem. Follow these instructions at home: When you have a nosebleed:  Sit down. Tilt your head forward a little. Follow these steps: Pinch your nose with a clean towel or tissue. Keep pinching your nose for 5 minutes. Do not let go. After 5 minutes, let go of your nose. Keep doing these steps until the bleeding stops. Do not put tissues or other things in your nose to stop the bleeding. Avoid lying down or putting your head back. Use a nose spray decongestant as told by your doctor. After a nosebleed: Try not to blow your nose or sniffle for several hours. Try not to strain, lift, or bend at the waist for several days. Aspirin and medicines that thin your blood make bleeding more likely. If you take these medicines: Ask your doctor if you should stop taking them or if you should change how much you take. Do not stop taking the medicine unless your doctor tells you to. If your nosebleed was caused by dryness, use over-the-counter saline nasal spray or gel and a humidifier as told by your doctor. This will keep the inside of your nose moist and allow it to heal. If you need to use nasal spray or gel: Choose one that is water-soluble. Use only as much as you need and use it only as often as needed. Do not lie down right away after you use it. If you get nosebleeds often, talk with your doctor about treatments. These may include: Nasal cautery. A chemical swab or electrical device is used to lightly burn tiny blood vessels inside the nose. This helps stop or prevent nosebleeds. Nasal packing. A gauze or other material is placed in the nose to keep  constant pressure on the bleeding area. Contact a doctor if: You have a fever. You get nosebleeds often. You get nosebleeds more often than usual. You bruise very easily. You have something stuck in your nose. You are bleeding in your mouth. You vomit or cough up brown material. You get a nosebleed after you start a new medicine. Get help right away if: You have a nosebleed after you fall or hurt your head. Your nosebleed does not go away after 20 minutes. You feel dizzy or weak. You have unusual bleeding from other parts of your body. You have unusual bruising on other parts of your body. You get sweaty. You vomit blood. Summary Nosebleeds are common. They are usually not a sign of a serious medical problem. When you have a nosebleed, sit down and tilt your head a little forward. Pinch your nose with a clean tissue for 5 minutes. Use saline spray or saline gel and a humidifier as told by your doctor. Get help right away if your nosebleed does not go away after 20 minutes. This information is not intended to replace advice given to you by your health care provider. Make sure you discuss any questions you have with your health care provider. Document Revised: 03/07/2021 Document Reviewed: 03/07/2021 Elsevier Patient Education  [place].

## 2022-04-06 ENCOUNTER — Emergency Department (HOSPITAL_COMMUNITY)
Admission: EM | Admit: 2022-04-06 | Discharge: 2022-04-06 | Discharge status: Against Medical Advice | Payer: Medicaid Other | Attending: Emergency Medicine | Admitting: Emergency Medicine

## 2022-04-06 ENCOUNTER — Encounter (HOSPITAL_COMMUNITY): Payer: Self-pay

## 2022-04-06 DIAGNOSIS — I10 Essential (primary) hypertension: Secondary | ICD-10-CM | POA: Diagnosis not present

## 2022-04-06 DIAGNOSIS — Z5321 Procedure and treatment not carried out due to patient leaving prior to being seen by health care provider: Secondary | ICD-10-CM | POA: Diagnosis not present

## 2022-04-06 DIAGNOSIS — R040 Epistaxis: Secondary | ICD-10-CM | POA: Insufficient documentation

## 2022-04-06 LAB — CBC WITH DIFFERENTIAL/PLATELET
Abs Immature Granulocytes: 0.01 10*3/uL (ref 0.00–0.07)
Basophils Absolute: 0 10*3/uL (ref 0.0–0.2)
Basophils Absolute: 0 10*3/uL (ref 0.0–0.1)
Basophils Relative: 1 %
Basos: 1 %
EOS (ABSOLUTE): 0.1 10*3/uL (ref 0.0–0.4)
Eos: 2 %
Eosinophils Absolute: 0.1 10*3/uL (ref 0.0–0.5)
Eosinophils Relative: 2 %
HCT: 49.5 % (ref 39.0–52.0)
Hematocrit: 51.9 % — ABNORMAL HIGH (ref 37.5–51.0)
Hemoglobin: 17.4 g/dL (ref 13.0–17.7)
Hemoglobin: 16.9 g/dL (ref 13.0–17.0)
Immature Grans (Abs): 0 10*3/uL (ref 0.0–0.1)
Immature Granulocytes: 0 %
Immature Granulocytes: 0 %
Lymphocytes Absolute: 2.7 10*3/uL (ref 0.7–3.1)
Lymphocytes Relative: 44 %
Lymphs Abs: 2.6 10*3/uL (ref 0.7–4.0)
Lymphs: 42 %
MCH: 28.9 pg (ref 26.6–33.0)
MCH: 29.6 pg (ref 26.0–34.0)
MCHC: 33.5 g/dL (ref 31.5–35.7)
MCHC: 34.1 g/dL (ref 30.0–36.0)
MCV: 86 fL (ref 79–97)
MCV: 86.8 fL (ref 80.0–100.0)
Monocytes Absolute: 0.4 10*3/uL (ref 0.1–0.9)
Monocytes Absolute: 0.4 10*3/uL (ref 0.1–1.0)
Monocytes Relative: 7 %
Monocytes: 6 %
Neutro Abs: 2.8 10*3/uL (ref 1.7–7.7)
Neutrophils Absolute: 3.2 10*3/uL (ref 1.4–7.0)
Neutrophils Relative %: 46 %
Neutrophils: 49 %
Platelets: 285 10*3/uL (ref 150–450)
Platelets: 268 10*3/uL (ref 150–400)
RBC: 6.03 x10E6/uL — ABNORMAL HIGH (ref 4.14–5.80)
RBC: 5.7 MIL/uL (ref 4.22–5.81)
RDW: 12.9 % (ref 11.6–15.4)
RDW: 13.3 % (ref 11.5–15.5)
WBC: 6.4 10*3/uL (ref 3.4–10.8)
WBC: 5.9 10*3/uL (ref 4.0–10.5)
nRBC: 0 % (ref 0.0–0.2)

## 2022-04-06 LAB — BASIC METABOLIC PANEL
Anion gap: 9 (ref 5–15)
BUN: 14 mg/dL (ref 6–20)
CO2: 25 mmol/L (ref 22–32)
Calcium: 9.1 mg/dL (ref 8.9–10.3)
Chloride: 101 mmol/L (ref 98–111)
Creatinine, Ser: 0.86 mg/dL (ref 0.61–1.24)
GFR, Estimated: >60 mL/min (ref >60)
Glucose, Bld: 128 mg/dL — ABNORMAL HIGH (ref 70–99)
Potassium: 4.1 mmol/L (ref 3.5–5.1)
Sodium: 135 mmol/L (ref 135–145)

## 2022-04-06 LAB — PROTIME-INR
INR: 1.1 (ref 0.8–1.2)
Prothrombin Time: 13.9 seconds (ref 11.4–15.2)

## 2022-04-06 LAB — PT AND PTT
INR: 1 (ref 0.9–1.2)
Prothrombin Time: 10.8 s (ref 9.1–12.0)
aPTT: 29 s (ref 24–33)

## 2022-04-06 NOTE — ED Triage Notes (Signed)
Pt presents with c/o epistaxis. Pt reports the nosebleeds have been going on several times a day since Sunday. Pt reports he went to see his PCP yesterday and was told that it was likely due to his overuse of aspirin/bc powder.

## 2022-04-06 NOTE — ED Provider Triage Note (Signed)
Emergency Medicine Provider Triage Evaluation Note  [name] , a 47 y.o. male  was evaluated in triage.  Pt complains of concerns for epistaxis onset 5 days. Has been evaluated by UC for similar symptoms on 04/05/22. Has a follow up appointment with his ENT specialist in February. Notes that he has HTN and took his meds this morning. Denies blowing his nose today as the cause of his symptoms. Notes that he woke up this morning and his nose started to bleed. Pt reports that he was holding pressure to his nasal bone this morning. He states that the bleeding stopped however he blew his nose and a clot came out and the bleeding started again.  Review of Systems  Positive:  Negative:   Physical Exam  BP (!) 154/113 (BP Location: Right Arm)   Pulse 73   Temp 98.1 F (36.7 C)   Resp 16   SpO2 100%  Gen:   Awake, no distress   Resp:  Normal effort  MSK:   Moves extremities without difficulty  Other:  No septum deviation noted. No appreciable bleeding noted to bilateral nares at this time.   Medical Decision Making  Medically screening exam initiated at 8:31 AM.  Appropriate orders placed.  [name] was informed that the remainder of the evaluation will be completed by another provider, this initial triage assessment does not replace that evaluation, and the importance of remaining in the ED until their evaluation is complete.  Work-up initiated.    Jessey Huyett A, PA-C 04/06/22 [phone]

## 2022-04-10 ENCOUNTER — Telehealth: Payer: Self-pay | Admitting: Emergency Medicine

## 2022-04-10 NOTE — Telephone Encounter (Signed)
Copied from [place] [id number]. Topic: General - Inquiry >> Apr 10, 2022  1:10 PM Leilani Able wrote: Pt has an appt on 2/1 with Dr Lenna Sciara. Pt wants a note to give employer that nose bleeds are affecting job.Pt does not want to wait till 2/1. He wants a note now.  Pt did go to ER and agent offered that ED would provide note but only wants note from dr. Pt extremely insistent FU [phone]

## 2022-04-11 NOTE — Telephone Encounter (Signed)
Called & spoke to the patient. Verified name & DOB. Informed patient that Dr.Johnson is not in office today and will be back tomorrow 04/12/2022 to speak with him. Patient expressed understanding.

## 2022-04-12 ENCOUNTER — Encounter: Payer: Self-pay | Admitting: Internal Medicine

## 2022-04-12 ENCOUNTER — Ambulatory Visit: Payer: Medicaid Other | Attending: Internal Medicine | Admitting: Internal Medicine

## 2022-04-12 ENCOUNTER — Other Ambulatory Visit: Payer: Self-pay

## 2022-04-12 VITALS — BP 148/97 | HR 77 | Temp 97.7°F | Ht 74.0 in | Wt 200.0 lb

## 2022-04-12 DIAGNOSIS — I10 Essential (primary) hypertension: Secondary | ICD-10-CM | POA: Diagnosis not present

## 2022-04-12 DIAGNOSIS — R040 Epistaxis: Secondary | ICD-10-CM | POA: Diagnosis not present

## 2022-04-12 DIAGNOSIS — S90512A Abrasion, left ankle, initial encounter: Secondary | ICD-10-CM | POA: Diagnosis not present

## 2022-04-12 DIAGNOSIS — Z1211 Encounter for screening for malignant neoplasm of colon: Secondary | ICD-10-CM

## 2022-04-12 MED ORDER — OXYMETAZOLINE HCL 0.05 % NA SOLN
NASAL | 0 refills | Status: DC
  Filled 2022-04-12: qty 30, fill #0

## 2022-04-12 MED ORDER — MUPIROCIN 2 % EX OINT
1.0000 | TOPICAL_OINTMENT | Freq: Two times a day (BID) | CUTANEOUS | 0 refills | Status: DC
  Filled 2022-04-12: qty 22, 11d supply, fill #0

## 2022-04-12 NOTE — Progress Notes (Signed)
Patient ID: [name], male    DOB: May 13, 1975  MRN: [id number]  CC: Hypertension (HTN f/u./Intermittent nosebleeds X5 days. Cherie Dark on L leg X2 days/No to flu vax. )   Subjective: [name] is a 47 y.o. male who presents for chronic ds management His concerns today include:  Hx of poorly controlled  HTN, preDM, tob dep, poor dentition, chronic lower back pain, history of reflex syncope, callus of left Achilles tendon.   HTN: Patient should be on amlodipine 10 mg daily, carvedilol 25 mg daily, spironolactone 50 mg daily and Cozaar 100 mg daily.  He was rushing to come here this a.m and forgot to take his meds.    Epistaxis: Seen by our PA 04/05/2022 with history of nosebleed post sneezing episode 4 days.  He proceeded to have 2-3 more nosebleeds that day.  Since then, he has had 2-3 nosebleeds a day initially from the right side but now both sides though more from the right.  He had been using 2-3 BC powders daily for various aches and pains.  Blood pressure was elevated.  Her assessment was that the epistaxis was due to elevated blood pressure and frequent use of aspirin containing pain reliever.  He is since stopped using BC powder. He has had a few episodes of nosebleeds at work and had to be sent home. -Subsequently seen in ER 04/06/2022.  Blood pressure was elevated at 154/114.  No bleeding was noted in the nostrils.  INR was normal, H/H normal and chemistry normal except for mild elevation in glucose of 128.  Patient subsequently left before workup was completed because he had to go to work. He has not had any nosebleeds in the past 2 days.  He is supposed to return to work tomorrow which is Friday and Saturday.  He is wondering whether he should take a leave of absence.  He works for [employer] and will also be starting a new part-time job working for [employer].  Does not have insurance as yet.  Other concern is that he cut his left lower leg 2 days ago on a piece of metal in his car.  He was  wearing sweatpants and it cut through the sweatpants.  He had minor bleeding.  He is up-to-date with tetanus vaccine.   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 90 tablet 4   Biotin 10000 MCG TABS Take by mouth.     carvedilol (COREG) 25 MG tablet TAKE 1 TABLET (25 MG TOTAL) BY MOUTH 2 (TWO) TIMES DAILY WITH A MEAL. 120 tablet 4   cholecalciferol (VITAMIN D3) 25 MCG (1000 UNIT) tablet Take 1,000 Units by mouth daily.     diclofenac Sodium (VOLTAREN) 1 % GEL Apply 2 g topically 4 (four) times daily. 100 g 2   losartan (COZAAR) 100 MG tablet Take 1  tablet (100 mg total) by mouth daily. 30 tablet 6   methocarbamol (ROBAXIN) 500 MG tablet Take 1 tablet (500 mg total) by mouth every 8 (eight) hours as needed for muscle spasms. 30 tablet 0   spironolactone (ALDACTONE) 50 MG tablet Take 1 tablet (50 mg total) by mouth daily. 30 tablet 4   No current facility-administered medications on file prior to visit.    Allergies  Allergen Reactions   Penicillins Swelling and Rash    Has patient had a PCN reaction causing immediate rash, facial/tongue/throat swelling, SOB or lightheadedness with hypotension: YesYes Has patient had a PCN reaction causing severe rash involving mucus membranes or skin necrosis: NoNo Has patient had a PCN reaction that required  hospitalization YesYes Has patient had a PCN reaction occurring within the last 10 years: NoNo If all of the above answers are "NO", then may proceed with Cephalospori    Social History   Socioeconomic History   Marital status: Single    Spouse name: Not on file   Number of children: Not on file   Years of education: Not on file   Highest education level: Not on file  Occupational History   Not on file  Tobacco Use   Smoking status: Former    Packs/day: 1.00    Types: Cigars, Cigarettes    Quit date: 05/10/2015    Years since quitting: 6.9   Smokeless tobacco: Never  Vaping Use   Vaping Use: Never used  Substance and Sexual Activity   Alcohol use: Yes    Comment: occ   Drug use: No   Sexual activity: Not on file  Other Topics Concern   Not on file  Social History Narrative   Not on file   Social Determinants of Health   Financial Resource Strain: Not on file  Food Insecurity: Not on file  Transportation Needs: Not on file  Physical Activity: Not on file  Stress: Not on file  Social Connections: Not on file  Intimate Partner Violence: Not on file    Family History  Problem Relation Age of Onset   Cancer Mother     Past Surgical History:  Procedure Laterality Date   CHOLECYSTECTOMY      ROS: Review of Systems Negative except as stated above  PHYSICAL EXAM: BP (!) 148/97 (BP Location: Left Arm, Patient Position: Sitting, Cuff Size: Normal)   Pulse 77   Temp 97.7 F (36.5 C) (Oral)   Ht '6\' 2"'$  (1.88 m)   Wt 200 lb (90.7 kg)   SpO2 98%   BMI 25.68 kg/m   Wt Readings from Last 3 Encounters:  04/12/22 200 lb (90.7 kg)  04/05/22 205 lb 9.6 oz (93.3 kg)  12/08/21 204 lb 9.6 oz (92.8 kg)  Repeat blood pressure 150/100  Physical Exam   General appearance - alert, well appearing, and in no distress Mental status - normal mood, behavior, speech, dress, motor activity, and thought processes Nose -no dried blood noted in either nostrils.  Nasal mucosa  dry. Chest - clear to auscultation, no wheezes, rales or rhonchi, symmetric air entry Heart - normal rate, regular rhythm, normal S1, S2, no murmurs, rubs, clicks or gallops Extremities - peripheral pulses normal, no pedal edema, no clubbing or cyanosis Skin -he has a Band-Aid over the left lower leg posteriorly close to the ankle.  This Band-Aid was removed.  He has an area of abrasion measuring about 3 x 3 cm.  No erythema noted.  The area is sore  to touch.  No fluctuance.     Latest Ref Rng & Units 04/06/2022    9:13 AM 12/08/2021   10:44 AM 08/14/2021    4:33 PM  CMP  Glucose 70 - 99 mg/dL 128  114  91   BUN 6 - 20 mg/dL '14  16  8   '$ Creatinine 0.61 - 1.24 mg/dL 0.86  0.93  1.03   Sodium 135 - 145 mmol/L 135  140  142   Potassium 3.5 - 5.1 mmol/L 4.1  4.5  4.2   Chloride 98 - 111 mmol/L 101  104  106   CO2 22 - 32 mmol/L '25  22  26   '$ Calcium 8.9 - 10.3 mg/dL 9.1  10.1  9.3    Lipid Panel     Component Value Date/Time   CHOL 129 10/19/2020 1136   TRIG 114 10/19/2020 1136   HDL 39 (L) 10/19/2020 1136   CHOLHDL 3.3 10/19/2020 1136   LDLCALC 69 10/19/2020 1136    CBC    Component Value Date/Time   WBC 5.9 04/06/2022 0913   RBC 5.70 04/06/2022 0913   HGB 16.9 04/06/2022 0913   HGB 17.4 04/05/2022 1010   HCT 49.5 04/06/2022 0913   HCT 51.9 (H) 04/05/2022 1010   PLT 268 04/06/2022 0913   PLT 285 04/05/2022 1010   MCV 86.8 04/06/2022 0913   MCV 86 04/05/2022 1010   MCH 29.6 04/06/2022 0913   MCHC 34.1 04/06/2022 0913   RDW 13.3 04/06/2022 0913   RDW 12.9 04/05/2022 1010   LYMPHSABS 2.6 04/06/2022 0913   LYMPHSABS 2.7 04/05/2022 1010   MONOABS 0.4 04/06/2022 0913   EOSABS 0.1 04/06/2022 0913   EOSABS 0.1 04/05/2022 1010   BASOSABS 0.0 04/06/2022 0913   BASOSABS 0.0 04/05/2022 1010    ASSESSMENT AND PLAN: 1. Epistaxis Avoid picking the nose which she has not been doing. Try to keep the ear in the house moist by getting a humidifier Limited trial of Afrin nasal spray.   Warned that this is not to be used daily and long-term as it can elevate blood pressure more.  Advised to use it for 2 days and then after that as needed if he feels a nosebleed coming on.  We will try to get him to ENT.  Encourage patient to apply for Medicaid as I think he will qualify Note given keeping him out of work with return date 04/16/2022 - Ambulatory referral to ENT - oxymetazoline (AFRIN NASAL SPRAY) 0.05 % nasal spray; 1 spray each nostril daily x 2 days then PRN.  Not to be use on a continuous daily bases  Dispense: 30 mL; Refill: 0  2. Essential hypertension Not at goal.  He has not taken his medicines as yet for the morning.  Advised that he takes them when he returns home and every day as prescribed.  Follow-up with clinical pharmacist in 2 weeks for recheck.  3. Abrasion of left ankle, initial encounter This does not appear infected.  He is up-to-date with tetanus vaccine.  Will give some Bactroban ointment to use daily - mupirocin ointment (BACTROBAN) 2 %; Apply 1 Application topically 2 (two) times daily.  Dispense: 22 g; Refill: 0  4. Screening for colon cancer Patient misplaced the fit test that was given to him in the past.  He requested a new one today.  Encouraged him to use and turn in as soon as possible. - Fecal occult blood, imunochemical([facility])    Patient was  given the opportunity to ask questions.  Patient verbalized understanding of the plan and was able to repeat key elements of the plan.   This documentation was completed using Radio producer.  Any transcriptional errors are unintentional.  Orders Placed This Encounter  Procedures   Fecal occult blood, imunochemical([facility])   Ambulatory referral to ENT     Requested Prescriptions   Signed Prescriptions Disp Refills   mupirocin ointment (BACTROBAN) 2 % 22 g 0    Sig: Apply 1 Application topically 2 (two) times daily.   oxymetazoline (AFRIN NASAL SPRAY) 0.05 %  nasal spray 30 mL 0    Sig: 1 spray each nostril daily x 2 days then PRN.  Not to be use on a continuous daily bases    Return in about 2 months (around 06/11/2022) for Follow up in 2 mo. Appt with Lurena Joiner in 2 weeks. Karle Plumber, MD, FACP

## 2022-04-12 NOTE — Progress Notes (Deleted)
Patient ID: [name], male    DOB: Nov 13, 1975  MRN: [id number]  CC: Hypertension (HTN f/u./Intermittent nosebleeds X5 days. Cherie Dark on L leg X2 days/No to flu vax. )   Subjective: [name] is a 47 y.o. male who presents for chronic ds management His concerns today include:  Hx of poorly controlled  HTN, preDM, tob dep, poor dentition, chronic lower back pain, history of reflex syncope, callus of left Achilles tendon.   HTN: Patient should be on amlodipine 10 mg daily, carvedilol 25 mg daily, spironolactone 50 mg daily and Cozaar 100 mg daily.  He was rushing to come here this a.m and forgot to take his meds.    Epistaxis: Seen by our PA 04/05/2022 with history of nosebleed post sneezing episode 4 days.  He proceeded to have 2-3 more nosebleeds that day.  Since then, he has had 2-3 nosebleeds a day initially from the right side but now both sides though more from the right.  He had been using 2-3 BC powders daily for various aches and pains.  Blood pressure was elevated.  Her assessment was that the epistaxis was due to elevated blood pressure and frequent use of aspirin containing pain reliever.  He is since stopped using BC powder. He has had a few episodes of nosebleeds at work and had to be sent home. -Subsequently seen in ER 04/06/2022.  Blood pressure was elevated at 154/114.  No bleeding was noted in the nostrils.  INR was normal, H/H normal and chemistry normal except for mild elevation in glucose of 128.  Patient subsequently left before workup was completed because he had to go to work. He has not had any nosebleeds in the past 2 days.  He is supposed to return to work tomorrow which is Friday and Saturday.  He is wondering whether he should take a leave of absence.  He works for [employer] and will also be starting a new part-time job working for [employer].  Does not have insurance as yet.  Other concern is that he cut his left lower leg 2 days ago on a piece of metal in his car.  He  was wearing sweatpants and it cut through the sweatpants.  He had minor bleeding.  He is up-to-date with tetanus vaccine.   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 90 tablet 4   Biotin 10000 MCG TABS Take by mouth.     carvedilol (COREG) 25 MG tablet TAKE 1 TABLET (25 MG TOTAL) BY MOUTH 2 (TWO) TIMES DAILY WITH A MEAL. 120 tablet 4   cholecalciferol (VITAMIN D3) 25 MCG (1000 UNIT) tablet Take 1,000 Units by mouth daily.     diclofenac Sodium (VOLTAREN) 1 % GEL Apply 2 g topically 4 (four) times daily. 100 g 2   losartan (COZAAR) 100 MG tablet Take  1 tablet (100 mg total) by mouth daily. 30 tablet 6   methocarbamol (ROBAXIN) 500 MG tablet Take 1 tablet (500 mg total) by mouth every 8 (eight) hours as needed for muscle spasms. 30 tablet 0   spironolactone (ALDACTONE) 50 MG tablet Take 1 tablet (50 mg total) by mouth daily. 30 tablet 4   No current facility-administered medications on file prior to visit.    Allergies  Allergen Reactions   Penicillins Swelling and Rash    Has patient had a PCN reaction causing immediate rash, facial/tongue/throat swelling, SOB or lightheadedness with hypotension: {Yes/No:30480221}Yes Has patient had a PCN reaction causing severe rash involving mucus membranes or skin necrosis: {Yes/No:30480221}No Has patient had a  PCN reaction that required hospitalization {Yes/No:30480221}Yes Has patient had a PCN reaction occurring within the last 10 years: {Yes/No:30480221}No If all of the above answers are "NO", then may proceed with Cephalospori    Social History   Socioeconomic History   Marital status: Single    Spouse name: Not on file   Number of children: Not on file   Years of education: Not on file   Highest education level: Not on file  Occupational History   Not on file  Tobacco Use   Smoking status: Former    Packs/day: 1.00    Types: Cigars, Cigarettes    Quit date: 05/10/2015    Years since quitting: 6.9   Smokeless tobacco: Never  Vaping Use   Vaping Use: Never used  Substance and Sexual Activity   Alcohol use: Yes    Comment: occ   Drug use: No   Sexual activity: Not on file  Other Topics Concern   Not on file  Social History Narrative   Not on file   Social Determinants of Health   Financial Resource Strain: Not on file  Food Insecurity: Not on file  Transportation Needs: Not on file  Physical Activity: Not on file  Stress: Not on file  Social Connections: Not on file  Intimate Partner Violence: Not on file    Family History  Problem Relation Age of Onset   Cancer Mother     Past Surgical History:  Procedure Laterality Date   CHOLECYSTECTOMY      ROS: Review of Systems Negative except as stated above  PHYSICAL EXAM: BP (!) 148/97 (BP Location: Left Arm, Patient Position: Sitting, Cuff Size: Normal)   Pulse 77   Temp 97.7 F (36.5 C) (Oral)   Ht '6\' 2"'$  (1.88 m)   Wt 200 lb (90.7 kg)   SpO2 98%   BMI 25.68 kg/m   Wt Readings from Last 3 Encounters:  04/12/22 200 lb (90.7 kg)  04/05/22 205 lb 9.6 oz (93.3 kg)  12/08/21 204 lb 9.6 oz (92.8 kg)  Repeat blood pressure 150/100  Physical Exam   General appearance - alert, well appearing, and in no distress Mental status - normal mood, behavior, speech, dress, motor activity, and thought processes Nose  -no dried blood noted in either nostrils.  Nasal mucosa dry. Chest - clear to auscultation, no wheezes, rales or rhonchi, symmetric air entry Heart - normal rate, regular rhythm, normal S1, S2, no murmurs, rubs, clicks or gallops Extremities - peripheral pulses normal, no pedal edema, no clubbing or cyanosis Skin -he has a Band-Aid over the left lower leg posteriorly close to the ankle.  This Band-Aid was removed.  He has an area of abrasion measuring about 3 x 3 cm.  No erythema noted.  The area is  sore to touch.  No fluctuance.     Latest Ref Rng & Units 04/06/2022    9:13 AM 12/08/2021   10:44 AM 08/14/2021    4:33 PM  CMP  Glucose 70 - 99 mg/dL 128  114  91   BUN 6 - 20 mg/dL '14  16  8   '$ Creatinine 0.61 - 1.24 mg/dL 0.86  0.93  1.03   Sodium 135 - 145 mmol/L 135  140  142   Potassium 3.5 - 5.1 mmol/L 4.1  4.5  4.2   Chloride 98 - 111 mmol/L 101  104  106   CO2 22 - 32 mmol/L '25  22  26   '$ Calcium 8.9 - 10.3 mg/dL 9.1  10.1  9.3    Lipid Panel     Component Value Date/Time   CHOL 129 10/19/2020 1136   TRIG 114 10/19/2020 1136   HDL 39 (L) 10/19/2020 1136   CHOLHDL 3.3 10/19/2020 1136   LDLCALC 69 10/19/2020 1136    CBC    Component Value Date/Time   WBC 5.9 04/06/2022 0913   RBC 5.70 04/06/2022 0913   HGB 16.9 04/06/2022 0913   HGB 17.4 04/05/2022 1010   HCT 49.5 04/06/2022 0913   HCT 51.9 (H) 04/05/2022 1010   PLT 268 04/06/2022 0913   PLT 285 04/05/2022 1010   MCV 86.8 04/06/2022 0913   MCV 86 04/05/2022 1010   MCH 29.6 04/06/2022 0913   MCHC 34.1 04/06/2022 0913   RDW 13.3 04/06/2022 0913   RDW 12.9 04/05/2022 1010   LYMPHSABS 2.6 04/06/2022 0913   LYMPHSABS 2.7 04/05/2022 1010   MONOABS 0.4 04/06/2022 0913   EOSABS 0.1 04/06/2022 0913   EOSABS 0.1 04/05/2022 1010   BASOSABS 0.0 04/06/2022 0913   BASOSABS 0.0 04/05/2022 1010    ASSESSMENT AND PLAN: 1. Epistaxis Avoid picking the nose which she has not been doing. Try to keep the ear in the house moist by  getting a humidifier Limited trial of Afrin nasal spray.  Warned that this is not to be used daily and long-term as it can elevate blood pressure more.  Advised to use it for 2 days and then after that as needed if he feels a nosebleed coming on.  We will try to get him to ENT.  Encourage patient to apply for Medicaid as I think he will qualify Note given keeping him out of work with return date 04/16/2022 - Ambulatory referral to ENT - oxymetazoline (AFRIN NASAL SPRAY) 0.05 % nasal spray; 1 spray each nostril daily x 2 days then PRN.  Not to be use on a continuous daily bases  Dispense: 30 mL; Refill: 0  2. Essential hypertension Not at goal.  He has not taken his medicines as yet for the morning.  Advised that he takes them when he returns home and every day as prescribed.  Follow-up with clinical pharmacist in 2 weeks for recheck.  3. Abrasion of left ankle, initial encounter This does not appear infected.  He is up-to-date with tetanus vaccine.  Will give some Bactroban ointment to use daily - mupirocin ointment (BACTROBAN) 2 %; Apply 1 Application topically 2 (two) times daily.  Dispense: 22 g; Refill: 0  4. Screening for colon cancer Patient misplaced the fit test that was given to him in the past.  He requested a new one today.  Encouraged him to use and turn in as soon as possible. - Fecal occult blood, imunochemical([facility])    Patient  was given the opportunity to ask questions.  Patient verbalized understanding of the plan and was able to repeat key elements of the plan.   This documentation was completed using Radio producer.  Any transcriptional errors are unintentional.  Orders Placed This Encounter  Procedures   Fecal occult blood, imunochemical([facility])   Ambulatory referral to ENT     Requested Prescriptions   Signed Prescriptions Disp Refills   mupirocin ointment (BACTROBAN) 2 % 22 g 0    Sig: Apply 1 Application topically 2  (two) times daily.   oxymetazoline (AFRIN NASAL SPRAY) 0.05 % nasal spray 30 mL 0    Sig: 1 spray each nostril daily x 2 days then PRN.  Not to be use on a continuous daily bases    Return in about 2 months (around 06/11/2022) for Follow up in 2 mo. Appt with Lurena Joiner in 2 weeks. Karle Plumber, MD, FACP

## 2022-04-18 ENCOUNTER — Telehealth: Payer: Self-pay | Admitting: Internal Medicine

## 2022-04-18 NOTE — Telephone Encounter (Signed)
Pt reports that he will come today at 1:30 because he needs it for work today at 3 pm.

## 2022-04-18 NOTE — Telephone Encounter (Signed)
Called & spoke to the patient. Verified name & DOB. 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 He will no longer need a work note.

## 2022-04-18 NOTE — Telephone Encounter (Signed)
Copied from [place]. Topic: General - Other >> Apr 18, 2022 11:59 AM [place] B wrote: Reason for CRM: Patient is called in requesting Dr's not for work today for his nosebleeds.

## 2022-04-19 ENCOUNTER — Other Ambulatory Visit: Payer: Self-pay

## 2022-04-20 ENCOUNTER — Ambulatory Visit: Payer: Self-pay

## 2022-04-20 ENCOUNTER — Other Ambulatory Visit: Payer: Self-pay

## 2022-04-20 ENCOUNTER — Ambulatory Visit: Payer: Medicaid Other | Attending: Nurse Practitioner | Admitting: Nurse Practitioner

## 2022-04-20 ENCOUNTER — Encounter: Payer: Self-pay | Admitting: Nurse Practitioner

## 2022-04-20 VITALS — BP 111/69 | HR 78 | Ht 74.0 in | Wt 210.0 lb

## 2022-04-20 DIAGNOSIS — N529 Male erectile dysfunction, unspecified: Secondary | ICD-10-CM | POA: Diagnosis not present

## 2022-04-20 DIAGNOSIS — I10 Essential (primary) hypertension: Secondary | ICD-10-CM

## 2022-04-20 DIAGNOSIS — M5441 Lumbago with sciatica, right side: Secondary | ICD-10-CM | POA: Diagnosis not present

## 2022-04-20 DIAGNOSIS — G8929 Other chronic pain: Secondary | ICD-10-CM

## 2022-04-20 MED ORDER — NAPROXEN 500 MG PO TABS
500.0000 mg | ORAL_TABLET | Freq: Two times a day (BID) | ORAL | 0 refills | Status: DC
  Filled 2022-04-20: qty 30, 15d supply, fill #0

## 2022-04-20 MED ORDER — SILDENAFIL CITRATE 100 MG PO TABS
50.0000 mg | ORAL_TABLET | Freq: Every day | ORAL | 11 refills | Status: DC | PRN
  Filled 2022-04-20: qty 10, 30d supply, fill #0
  Filled 2022-05-05: qty 10, 30d supply, fill #1
  Filled 2022-05-31 – 2022-07-09 (×2): qty 10, 30d supply, fill #2
  Filled 2022-09-03 – 2022-10-25 (×3): qty 10, 30d supply, fill #3
  Filled 2022-12-20: qty 10, 30d supply, fill #4
  Filled 2023-02-13 – 2023-02-26 (×2): qty 10, 30d supply, fill #5

## 2022-04-20 NOTE — Progress Notes (Signed)
Assessment & Plan:  [name] was seen today for hypertension and erectile dysfunction.  Diagnoses and all orders for this visit:  Primary hypertension Continue all antihypertensives as prescribed.  Reminded to bring in blood pressure log for follow  up appointment.  RECOMMENDATIONS: DASH/Mediterranean Diets are healthier choices for HTN.    Erectile dysfunction, unspecified erectile dysfunction type -     sildenafil (VIAGRA) 100 MG tablet; Take 0.5-1 tablets (50-100 mg total) by mouth daily as needed for erectile dysfunction.  Chronic bilateral low back pain with right-sided sciatica -     DG Lumbar Spine Complete; Future -     naproxen (NAPROSYN) 500 MG tablet; Take 1 tablet (500 mg total) by mouth 2 (two) times daily with a meal. FOR LOW BACK PAIN Work on losing weight to help reduce joint pain. May alternate with heat and ice application for pain relief. May also alternate with acetaminophen as prescribed pain relief. Other alternatives include massage, acupuncture and water aerobics.   Patient has been counseled on age-appropriate routine health concerns for screening and prevention. These are reviewed and up-to-date. Referrals have been placed accordingly. Immunizations are up-to-date or declined.    Subjective:   Chief Complaint  Patient presents with   Hypertension   Erectile Dysfunction    [name] 47 y.o. male presents to office today for hypertension and erectile dysfunction  Hx of poorly controlled  HTN, preDM, tob dep, poor dentition, chronic lower back pain, history of reflex syncope, callus of left Achilles tendon.    HTN Blood pressure is well controlled today despite his reports of elevated blood pressures at home. He is taking amlodipine 10 mg daily, carvedilol 25 mg BID, losartan 100 mg daily, spironolactone 50 mg daily.  BP Readings from Last 3 Encounters:  04/20/22 111/69  04/12/22 (!) 148/97  04/06/22 (!) 139/100     Erectile dysfunction Onset 2  weeks ago. Endorses difficulty achieving and maintaining an erection.   Back Pain: Patient presents for presents evaluation of low back problems.  Symptoms have been present for several years and include  pain in the lower back . Associated symptoms include RLE sciatica in the thigh area.  Initial inciting event:  heavy lifting . He does this for his job at [employer]. Symptoms are worst: morning. Alleviating factors identifiable by patient are medication NSAIDs . Exacerbating factors identifiable by patient are recumbency, sitting, and standing. Treatments so far initiated by patient:  OTC NSAIDs  Previous lower back problems:  yes .  Previous workup:  lumbar xray 2018 . Diffuse degenerative change. Minimal T11 chronic compression, no change from prior exam.  Previous treatments: NSAIDs and muscle relaxant   Review of Systems  Constitutional:  Negative for fever, malaise/fatigue and weight loss.  HENT: Negative.  Negative for nosebleeds.   Eyes: Negative.  Negative for blurred vision, double vision and photophobia.  Respiratory: Negative.  Negative for cough and shortness of breath.   Cardiovascular: Negative.  Negative for chest pain, palpitations and leg swelling.  Gastrointestinal: Negative.  Negative for heartburn, nausea and vomiting.  Genitourinary:  Negative for dysuria, flank pain, frequency, hematuria and urgency.       ED  Musculoskeletal:  Positive for back pain. Negative for myalgias.  Neurological:  Positive for sensory change. Negative for dizziness, focal weakness, seizures and headaches.  Psychiatric/Behavioral: Negative.  Negative for suicidal ideas.     Past Medical History:  Diagnosis Date   Hypertension 2003   dx age 66  Wears glasses     Past Surgical History:  Procedure Laterality Date   CHOLECYSTECTOMY      Family History  Problem Relation Age of Onset   Cancer Mother     Social History Reviewed with no changes to be made today.   Outpatient Medications  Prior to Visit  Medication Sig Dispense Refill   amLODipine (NORVASC) 10 MG tablet Take 1 tablet (10 mg total) by mouth daily. 90 tablet 4   Biotin 10000 MCG TABS Take by mouth.     carvedilol (COREG) 25 MG tablet TAKE 1 TABLET (25 MG TOTAL) BY MOUTH 2 (TWO) TIMES DAILY WITH A MEAL. 120 tablet 4   cholecalciferol (VITAMIN D3) 25 MCG (1000 UNIT) tablet Take 1,000 Units by mouth daily.     diclofenac Sodium (VOLTAREN) 1 % GEL Apply 2 g topically 4 (four) times daily. 100 g 2   losartan (COZAAR) 100 MG tablet Take 1 tablet (100 mg total) by mouth daily. 30 tablet 6   methocarbamol (ROBAXIN) 500 MG tablet Take 1 tablet (500 mg total) by mouth every 8 (eight) hours as needed for muscle spasms. 30 tablet 0   mupirocin ointment (BACTROBAN) 2 % Apply 1 Application topically 2 (two) times daily. 22 g 0   oxymetazoline (AFRIN NASAL SPRAY) 0.05 % nasal spray 1 spray each nostril daily x 2 days then PRN.  Not to be use on a continuous daily bases 30 mL 0   spironolactone (ALDACTONE) 50 MG tablet Take 1 tablet (50 mg total) by mouth daily. 30 tablet 4   No facility-administered medications prior to visit.    Allergies  Allergen Reactions   Penicillins Swelling and Rash    Has patient had a PCN reaction causing immediate rash, facial/tongue/throat swelling, SOB or lightheadedness with hypotension: YesYes Has patient had a PCN reaction causing severe rash involving mucus membranes or skin necrosis: NoNo Has patient had a PCN reaction that required hospitalization YesYes Has patient had a PCN reaction occurring within the last 10 years: NoNo If all of the above answers are "NO", then may proceed with Cephalospori       Objective:    BP 111/69 Comment: 135/88  Pulse 78   Ht 6' 2"$  (1.88 m)   Wt 210 lb (95.3 kg)   SpO2 98%   BMI 26.96 kg/m  Wt Readings from Last 3 Encounters:  04/20/22 210 lb (95.3 kg)  04/12/22 200 lb (90.7 kg)  04/05/22 205 lb 9.6 oz (93.3 kg)    Physical Exam Vitals and  nursing note reviewed.  Constitutional:      Appearance: He is well-developed.  HENT:     Head: Normocephalic and atraumatic.  Cardiovascular:     Rate and Rhythm: Normal rate and regular rhythm.     Heart sounds: Normal heart sounds. No murmur heard.    No friction rub. No gallop.  Pulmonary:     Effort: Pulmonary effort is normal. No tachypnea or respiratory distress.     Breath sounds: Normal breath sounds. No decreased breath sounds, wheezing, rhonchi or rales.  Chest:     Chest wall: No tenderness.  Abdominal:     General: Bowel sounds are normal.     Palpations: Abdomen is soft.  Musculoskeletal:        General: Normal range of motion.     Cervical back: Normal range of motion.  Skin:    General: Skin is warm and dry.  Neurological:     Mental Status: He  is alert and oriented to person, place, and time.     Coordination: Coordination normal.  Psychiatric:        Behavior: Behavior normal. Behavior is cooperative.        Thought Content: Thought content normal.        Judgment: Judgment normal.          Patient has been counseled extensively about nutrition and exercise as well as the importance of adherence with medications and regular follow-up. The patient was given clear instructions to go to ER or return to medical center if symptoms don't improve, worsen or new problems develop. The patient verbalized understanding.   Follow-up: Return if symptoms worsen or fail to improve.   Gildardo Pounds, FNP-BC [facility], [place]   04/20/2022, 4:49 PM

## 2022-04-20 NOTE — Telephone Encounter (Signed)
Reason for Disposition . Systolic BP  >= 99991111 OR Diastolic >= A999333  Answer Assessment - Initial Assessment Questions 1. BLOOD PRESSURE: "What is the blood pressure?" "Did you take at least two measurements 5 minutes apart?"     154/116 2. ONSET: "When did you take your blood pressure?"     Today  3. HOW: "How did you take your blood pressure?" (e.g., automatic home BP monitor, visiting nurse)     Home BP 4. HISTORY: "Do you have a history of high blood pressure?"     yes 5. MEDICINES: "Are you taking any medicines for blood pressure?" "Have you missed any doses recently?"     Yes and no 6. OTHER SYMPTOMS: "Do you have any symptoms?" (e.g., blurred vision, chest pain, difficulty breathing, headache, weakness)     Impotency, just feeling off, joint pain and R leg pain  Protocols used: Blood Pressure - High-A-AH

## 2022-04-20 NOTE — Progress Notes (Signed)
Elevated BP at home.

## 2022-04-20 NOTE — Telephone Encounter (Signed)
  Chief Complaint: HTN  Symptoms: BP elevated 154/116, impotency, leg pain  Frequency: several days  Pertinent Negatives: Patient denies dizziness or HA Disposition: '[]'$ ED /'[]'$ Urgent Care (no appt availability in office) / '[x]'$ Appointment(In office/virtual)/ '[]'$  [facility]/ '[]'$ Home Care/ '[]'$ Refused Recommended Disposition /'[]'$ [facility] [place] [facility]/ '[]'$  Follow-up with PCP Additional Notes: pt states he has been taking his BP meds but not seeing DBP < 100 and started suddenly with impotency issues and wanting to figure out if medication related or something else going on. Offered appt today at 32 with Zelda, NP, scheduled pt and he is going to find transportation and if no luck will call back to see if other appt is still available

## 2022-04-20 NOTE — Telephone Encounter (Signed)
Pt called, unable to LVM d/t VM not set up.   Summary: BP concern   The patient would like to speak with a member of clinical staff regarding their BP  The patient checked their BP today 04/20/22 and it was 154/116 taken at 10:20 AM  Please contact the patient further when possible

## 2022-04-25 ENCOUNTER — Telehealth: Payer: Self-pay | Admitting: Internal Medicine

## 2022-04-25 ENCOUNTER — Ambulatory Visit: Payer: Self-pay | Admitting: *Deleted

## 2022-04-25 ENCOUNTER — Other Ambulatory Visit: Payer: Self-pay

## 2022-04-25 MED ORDER — CLINDAMYCIN HCL 300 MG PO CAPS
300.0000 mg | ORAL_CAPSULE | Freq: Three times a day (TID) | ORAL | 0 refills | Status: DC
  Filled 2022-04-25: qty 21, 7d supply, fill #0

## 2022-04-25 NOTE — Addendum Note (Signed)
Addended by: Karle Plumber B on: 04/25/2022 04:34 PM   Modules accepted: Orders

## 2022-04-25 NOTE — Telephone Encounter (Signed)
Reason for Disposition  [1] Face is swollen AND [2] no fever  Answer Assessment - Initial Assessment Questions 1. SYMPTOM: "What's the main symptom you're concerned about?" (e.g., chapped lips, dry mouth, lump, sores)     I don't have a dentist.   I broke a tooth.   I don't know when it happened.   It started bothering me yesterday.    Swollen up.   This happened one time before and Dr. Wynetta Emery wrote a rx for it.    I can't afford a dentist right now.   Dr. Wynetta Emery and I have talked about this.   She calls me in a prescription for the infection.   She knows what is going on with me and this broken tooth.    I've had it a while now. I need the rx today.     2. ONSET: "When did the  pain  start?"     2 days ago it started bothering me.    This morning it's swollen and sore. 3. PAIN: "Is there any pain?" If Yes, ask: "How bad is it?" (Scale: 1-10; mild, moderate, severe)   - MILD (1-3):  doesn't interfere with eating or normal activities   - MODERATE (4-7): interferes with eating some solids and normal activities   - SEVERE (8-10):  excruciating pain, interferes with most normal activities   - SEVERE DYSPHAGIA: can't swallow liquids, drooling     Moderate-severe pain 4. CAUSE: "What do you think is causing the symptoms?"     Broken tooth 5. OTHER SYMPTOMS: "Do you have any other symptoms?" (e.g., fever, sore throat, toothache, swelling)     Swelling and sore around the tooth 6. PREGNANCY: "Is there any chance you are pregnant?" "When was your last menstrual period?"     N/A  Protocols used: Mouth Symptoms-A-AH, Toothache-A-AH

## 2022-04-25 NOTE — Telephone Encounter (Signed)
Copied from [place] [id number]. Topic: General - Other >> Apr 25, 2022  2:08 PM Chapman Fitch wrote: Reason for CRM: Pt called about the NT message sent to Dr. Wynetta Emery about refilling a medication and having an appt / please advise and call pt

## 2022-04-25 NOTE — Telephone Encounter (Signed)
  Chief Complaint: Infected broken tooth  (He has had this for a while and Dr. Wynetta Emery is aware of it.   It has been discussed about him getting a dentist but can't afford it right now.   Dr. Wynetta Emery calls me in an antibiotic when it flares up. Symptoms: Broken tooth started hurting 2 days ago.   This morning it's swollen and painful. Frequency: Started 2 days ago but has had this broken tooth and flare ups "For a while now".    "I can't afford a dentist right now".  Pertinent Negatives: Patient denies having a dentist Disposition: '[]'$ ED /'[]'$ Urgent Care (no appt availability in office) / '[x]'$ Appointment(In office/virtual)/ '[]'$  [facility]/ '[]'$ Home Care/ '[]'$ Refused Recommended Disposition /'[]'$ [place] [facility]/ '[]'$  Follow-up with PCP Additional Notes: He is requesting Dr. Wynetta Emery call in an antibiotic for him today if possible.    An appt. Was made for 04/26/2022 at 3:30 with Dr. Wynetta Emery.    If she can write for him an rx today then he will cancel this appt. But wants to hold onto it just in case she can't get it called in today.

## 2022-04-26 ENCOUNTER — Ambulatory Visit: Payer: Self-pay | Admitting: Internal Medicine

## 2022-04-26 ENCOUNTER — Ambulatory Visit (HOSPITAL_COMMUNITY)
Admission: RE | Admit: 2022-04-26 | Discharge: 2022-04-26 | Disposition: A | Discharge status: Home / Self Care | Payer: Medicaid Other | Source: Ambulatory Visit | Attending: Nurse Practitioner | Admitting: Nurse Practitioner

## 2022-04-26 DIAGNOSIS — M5441 Lumbago with sciatica, right side: Secondary | ICD-10-CM | POA: Insufficient documentation

## 2022-04-26 DIAGNOSIS — G8929 Other chronic pain: Secondary | ICD-10-CM | POA: Insufficient documentation

## 2022-04-26 NOTE — Telephone Encounter (Signed)
Call placed to patient unable to reach message left  to call office. Medication has been sent to pharmacy.

## 2022-04-27 ENCOUNTER — Ambulatory Visit: Payer: Medicaid Other | Attending: Internal Medicine | Admitting: Pharmacist

## 2022-04-27 ENCOUNTER — Encounter: Payer: Self-pay | Admitting: Internal Medicine

## 2022-04-27 ENCOUNTER — Other Ambulatory Visit: Payer: Self-pay

## 2022-04-27 VITALS — BP 124/84

## 2022-04-27 DIAGNOSIS — I10 Essential (primary) hypertension: Secondary | ICD-10-CM

## 2022-04-27 DIAGNOSIS — E559 Vitamin D deficiency, unspecified: Secondary | ICD-10-CM

## 2022-04-27 DIAGNOSIS — R7303 Prediabetes: Secondary | ICD-10-CM

## 2022-04-27 MED ORDER — LOSARTAN POTASSIUM 100 MG PO TABS
100.0000 mg | ORAL_TABLET | Freq: Every day | ORAL | 1 refills | Status: DC
  Filled 2022-04-27 – 2022-05-15 (×3): qty 90, 90d supply, fill #0
  Filled 2022-09-03 – 2022-09-18 (×2): qty 90, 90d supply, fill #1

## 2022-04-27 MED ORDER — AMLODIPINE BESYLATE 10 MG PO TABS
10.0000 mg | ORAL_TABLET | Freq: Every day | ORAL | 1 refills | Status: DC
  Filled 2022-04-27 – 2022-05-15 (×3): qty 90, 90d supply, fill #0
  Filled 2022-09-03 – 2022-09-18 (×2): qty 90, 90d supply, fill #1

## 2022-04-27 MED ORDER — SPIRONOLACTONE 50 MG PO TABS
50.0000 mg | ORAL_TABLET | Freq: Every day | ORAL | 1 refills | Status: DC
  Filled 2022-04-27 – 2022-05-15 (×3): qty 90, 90d supply, fill #0
  Filled 2022-09-03 – 2022-09-18 (×2): qty 90, 90d supply, fill #1

## 2022-04-27 MED ORDER — CARVEDILOL 25 MG PO TABS
25.0000 mg | ORAL_TABLET | Freq: Two times a day (BID) | ORAL | 1 refills | Status: DC
  Filled 2022-04-27 – 2022-05-15 (×3): qty 180, 90d supply, fill #0
  Filled 2022-09-03 – 2022-09-18 (×2): qty 180, 90d supply, fill #1

## 2022-04-27 NOTE — Telephone Encounter (Signed)
Patient was called by Yvetta Coder, RN on 04/26/2021. Medication refills were sent to the pharmacy.  Patient was seen on 04/27/2021 by our clinical pharmacist Lurena Joiner V and given follow up appointments for 05/31/2022 and 06/11/2022 for upcoming follow ups.

## 2022-04-27 NOTE — Progress Notes (Signed)
   S:     No chief complaint on file.  [name] is a 47 y.o. male who presents for hypertension evaluation, education, and management. PMH is significant for HTN. Patient was referred by Dr. Wynetta Emery, on 04/12/2022.   Today, patient arrives in spirits and presents without assistance. Denies dizziness, headache, blurred vision, swelling. He does endorse continued symptoms of ED. Has problems getting a complete erection. Viagra has not been helpful. Additionally, he wants to check his vit D, cholesterol, and A1c level today.   Medication adherence reported. Patient has taken BP medications today.   Current antihypertensives include: amlodipine 10 mg, carvedilol 25 mg BID, losartan 100 mg daily, spironolactone 50 mg daily   Reported home BP readings:  -Was using older BP cuff that was giving falsely high readings per pt.  -Does not have a working cuff  Reported dietary habits:  -Compliant with salt restriction.  -Denies excessive intake of caffeine.   Reported exercise habits:  -No formal exercise regimen reported.   O:  Vitals:   04/27/22 1459  BP: 124/84   Last 3 Office BP readings: BP Readings from Last 3 Encounters:  04/27/22 124/84  04/20/22 111/69  04/12/22 (!) 148/97   BMET    Component Value Date/Time   NA 135 04/06/2022 0913   NA 140 12/08/2021 1044   K 4.1 04/06/2022 0913   CL 101 04/06/2022 0913   CO2 25 04/06/2022 0913   GLUCOSE 128 (H) 04/06/2022 0913   BUN 14 04/06/2022 0913   BUN 16 12/08/2021 1044   CREATININE 0.86 04/06/2022 0913   CREATININE 0.97 12/12/2015 1306   CALCIUM 9.1 04/06/2022 0913   GFRNONAA >60 04/06/2022 0913   GFRNONAA >89 12/12/2015 1306   GFRAA 91 01/29/2019 1044   GFRAA >89 12/12/2015 1306   A/P: Hypertension longstanding, currently at goal. BP goal < 130/80 mmHg. Medication adherence appears appropriate.   -Continued current regimen.  -Patient educated on purpose, proper use, and potential adverse effects of valsartan.   -Counseled on lifestyle modifications for blood pressure control including reduced dietary sodium, increased exercise, adequate sleep. -Encouraged patient to check BP at home and bring log of readings to next visit. Counseled on proper use of home BP cuff.  -Labs: Vit D(OH), Lipid, A1c per patient request.    Results reviewed and written information provided.    Total time in face to face counseling 30 minutes.    Follow-up:  PCP clinic visit in 4-6 weeks.   Benard Halsted, PharmD, [facility] [place], [place] [phone]

## 2022-04-28 ENCOUNTER — Other Ambulatory Visit: Payer: Self-pay | Admitting: Internal Medicine

## 2022-04-28 LAB — HEMOGLOBIN A1C
Est. average glucose Bld gHb Est-mCnc: 123 mg/dL
Hgb A1c MFr Bld: 5.9 % — ABNORMAL HIGH (ref 4.8–5.6)

## 2022-04-28 LAB — VITAMIN D 25 HYDROXY (VIT D DEFICIENCY, FRACTURES): Vit D, 25-Hydroxy: 14.1 ng/mL — ABNORMAL LOW (ref 30.0–100.0)

## 2022-04-28 MED ORDER — VITAMIN D 25 MCG (1000 UNIT) PO TABS
1000.0000 [IU] | ORAL_TABLET | Freq: Every day | ORAL | 1 refills | Status: DC
  Filled 2022-04-28: qty 60, 60d supply, fill #0

## 2022-04-30 ENCOUNTER — Other Ambulatory Visit: Payer: Self-pay

## 2022-05-03 ENCOUNTER — Other Ambulatory Visit: Payer: Self-pay

## 2022-05-05 ENCOUNTER — Other Ambulatory Visit (HOSPITAL_COMMUNITY): Payer: Self-pay

## 2022-05-15 ENCOUNTER — Other Ambulatory Visit: Payer: Self-pay

## 2022-05-28 ENCOUNTER — Telehealth: Payer: Self-pay | Admitting: Emergency Medicine

## 2022-05-28 DIAGNOSIS — N529 Male erectile dysfunction, unspecified: Secondary | ICD-10-CM

## 2022-05-28 NOTE — Addendum Note (Signed)
Addended by: Karle Plumber B on: 05/28/2022 09:02 PM   Modules accepted: Orders

## 2022-05-28 NOTE — Telephone Encounter (Signed)
Copied from [place] [id number]. Topic: Referral - Request for Referral >> May 28, 2022 10:44 AM Oley Balm A wrote: Has patient seen PCP for this complaint? No. Pt states that he spoke with Lurena Joiner about getting referred to a Urologist. Pt would like for PCP to do the referral for a Urologist.  Please call pt back.

## 2022-05-30 NOTE — Progress Notes (Unsigned)
S:     PCP: Dr. Wynetta Emery  47 y.o. male who presents for hypertension evaluation, education, and management.  PMH is significant for HTN and low vitamin D. Patient was referred by Dr. Wynetta Emery, on 04/12/2022.   At last visit with CPP, BP was close to goal and regimen was continued.   Today, patient arrives in good spirits and presents without assistance. Denies dizziness, headache, blurred vision, swelling. Patient continues to report fatigue and impotence. He has an appointment with urology scheduled to address this. He is currently taking OTC vitamin D for low vitamin D levels. He requested to have his vitamin D levels checked again today. Overall, he endorses that he has felt a bit more stressed recently.   Family/Social history:  -Fhx: cancer -Tobacco: smokes cigars -Alcohol: endorses  Medication adherence appropriate . Patient has taken BP medications today.   Current antihypertensives include: amlodipine 10 mg, carvedilol 25 mg BID, losartan 100 mg daily, spironolactone 50 mg daily   Reported home BP readings: not checking currently  Reported dietary habits:  -Compliant with salt restriction.  -Denies excessive intake of caffeine.    Reported exercise habits:  -No formal exercise regimen reported.   O:  Vitals:   05/31/22 1501  BP: (!) 147/91  Pulse: 67     Last 3 Office BP readings: BP Readings from Last 3 Encounters:  04/27/22 124/84  04/20/22 111/69  04/12/22 (!) 148/97    BMET    Component Value Date/Time   NA 135 04/06/2022 0913   NA 140 12/08/2021 1044   K 4.1 04/06/2022 0913   CL 101 04/06/2022 0913   CO2 25 04/06/2022 0913   GLUCOSE 128 (H) 04/06/2022 0913   BUN 14 04/06/2022 0913   BUN 16 12/08/2021 1044   CREATININE 0.86 04/06/2022 0913   CREATININE 0.97 12/12/2015 1306   CALCIUM 9.1 04/06/2022 0913   GFRNONAA >60 04/06/2022 0913   GFRNONAA >89 12/12/2015 1306   GFRAA 91 01/29/2019 1044   GFRAA >89 12/12/2015 1306    Renal function: CrCl  cannot be calculated (Patient's most recent lab result is older than the maximum 21 days allowed.).  Clinical ASCVD: No  The ASCVD Risk score (Arnett DK, et al., 2019) failed to calculate for the following reasons:   The valid total cholesterol range is 130 to 320 mg/dL   A/P: Hypertension diagnosed currently uncontrolled on current medications. BP goal < 130/80 mmHg. Medication adherence appears appropriate. Control is suboptimal due to stress.  Given his previous control, will and desire to not make any changes today, will let him have this rechecked at follow up with Dr. Wynetta Emery.  -Continued amlodipine 10 mg, carvedilol 25 mg BID, losartan 100 mg daily, spironolactone 50 mg daily  -Counseled on lifestyle modifications for blood pressure control including reduced dietary sodium, increased exercise, adequate sleep. -Encouraged patient to check BP at home and bring log of readings to next visit. Counseled on proper use of home BP cuff.  -Will reach out to Dr. Wynetta Emery regarding Rx dose vitamin D (ergocalciferol). Informed patient that it is too soon at this time to recheck 25-hydroxy vitamin D -Counseled for him to follow up with the pharmacist as needed pending BP at follow up appointment.  Results reviewed and written information provided.    Written patient instructions provided. Patient verbalized understanding of treatment plan.  Total time in face to face counseling 20 minutes.    Follow-up:  Pharmacist PRN. PCP clinic visit in 06/11/2022  Tinika Bucknam E.  Skeet Simmer, PharmD PGY-1 [facility] Resident

## 2022-05-30 NOTE — Telephone Encounter (Signed)
Called & spoke to the patient. Verified name & DOB. Informed that a referral to urology has been sent. Advised patient that some form of coverage will be needed. Informed patient that there will be Medicaid enrollment specialists at [facility] starting 06/04/2022 that he may attend to for further help and advised to apply for the [facility] financial assistance if he does not qualify. Patient expressed verbal understanding. No further questions at this time.

## 2022-05-31 ENCOUNTER — Other Ambulatory Visit: Payer: Self-pay

## 2022-05-31 ENCOUNTER — Ambulatory Visit: Payer: Medicaid Other | Attending: Internal Medicine | Admitting: Pharmacist

## 2022-05-31 ENCOUNTER — Encounter: Payer: Self-pay | Admitting: Pharmacist

## 2022-05-31 VITALS — BP 147/91 | HR 67

## 2022-05-31 DIAGNOSIS — I10 Essential (primary) hypertension: Secondary | ICD-10-CM | POA: Diagnosis not present

## 2022-06-01 ENCOUNTER — Telehealth: Payer: Self-pay | Admitting: Internal Medicine

## 2022-06-01 ENCOUNTER — Other Ambulatory Visit: Payer: Self-pay

## 2022-06-01 MED ORDER — VITAMIN D (ERGOCALCIFEROL) 1.25 MG (50000 UNIT) PO CAPS
50000.0000 [IU] | ORAL_CAPSULE | ORAL | 1 refills | Status: DC
  Filled 2022-06-01 – 2022-06-20 (×3): qty 12, 84d supply, fill #0

## 2022-06-01 NOTE — Telephone Encounter (Signed)
Patient called back. Verified name & DOB. Informed that a prescription had been sent to the pharmacy for once weekly vitamin D. Informed that if he was taking the vitamin D 1000 IU daily he should stop that one. Patient expressed understanding of all discussed. No further questions at this time.

## 2022-06-01 NOTE — Telephone Encounter (Signed)
-----   Message from Tresa Endo, RPH-CPP sent at 05/31/2022  3:53 PM EDT ----- Dr. Wynetta Emery,   Saw this patient today for another blood pressure visit, however, he was fixated on his vitamin D level. He tells me once took treatment dose weekly and requests. He even requested a VitD(OH) level today (after we just took it 2/16). I told him no that it was too soon to check again. He's interesting...  Thanks for including [place],  [employer]

## 2022-06-01 NOTE — Telephone Encounter (Signed)
Called but unable to LVM due to VM not set up.

## 2022-06-07 ENCOUNTER — Other Ambulatory Visit: Payer: Self-pay

## 2022-06-11 ENCOUNTER — Ambulatory Visit: Payer: Medicaid Other | Attending: Internal Medicine | Admitting: Internal Medicine

## 2022-06-11 ENCOUNTER — Other Ambulatory Visit: Payer: Self-pay

## 2022-06-11 ENCOUNTER — Encounter: Payer: Self-pay | Admitting: Internal Medicine

## 2022-06-11 VITALS — BP 136/90 | HR 78 | Ht 74.5 in | Wt 200.6 lb

## 2022-06-11 DIAGNOSIS — Z1211 Encounter for screening for malignant neoplasm of colon: Secondary | ICD-10-CM | POA: Diagnosis not present

## 2022-06-11 DIAGNOSIS — R7303 Prediabetes: Secondary | ICD-10-CM | POA: Diagnosis not present

## 2022-06-11 DIAGNOSIS — N529 Male erectile dysfunction, unspecified: Secondary | ICD-10-CM

## 2022-06-11 DIAGNOSIS — G8929 Other chronic pain: Secondary | ICD-10-CM | POA: Diagnosis not present

## 2022-06-11 DIAGNOSIS — R079 Chest pain, unspecified: Secondary | ICD-10-CM

## 2022-06-11 DIAGNOSIS — E559 Vitamin D deficiency, unspecified: Secondary | ICD-10-CM | POA: Diagnosis not present

## 2022-06-11 DIAGNOSIS — Z9049 Acquired absence of other specified parts of digestive tract: Secondary | ICD-10-CM | POA: Diagnosis not present

## 2022-06-11 DIAGNOSIS — I10 Essential (primary) hypertension: Secondary | ICD-10-CM

## 2022-06-11 DIAGNOSIS — F17290 Nicotine dependence, other tobacco product, uncomplicated: Secondary | ICD-10-CM | POA: Insufficient documentation

## 2022-06-11 DIAGNOSIS — F17200 Nicotine dependence, unspecified, uncomplicated: Secondary | ICD-10-CM

## 2022-06-11 MED ORDER — METHOCARBAMOL 500 MG PO TABS
500.0000 mg | ORAL_TABLET | Freq: Two times a day (BID) | ORAL | 0 refills | Status: DC | PRN
  Filled 2022-06-11 – 2022-06-20 (×2): qty 30, 15d supply, fill #0

## 2022-06-11 NOTE — Progress Notes (Signed)
Patient ID: [name], male    DOB: 09-25-1975  MRN: [id number]  CC: Hypertension   Subjective: [name] is a 47 y.o. male who presents for chronic ds management His concerns today include:  Hx of poorly controlled  HTN, preDM, tob dep, poor dentition, chronic lower back pain, history of reflex syncope, callus of left Achilles tendon.   HTN:  reports compliance with meds which are carvedilol 25 mg twice a day, Cozaar 100 mg daily, amlodipine 10 mg daily, spironolactone 50 mg daily. Has not checked BP in a few days.  Last reading 165/91 Limits salt in the foods as much as he can except when he eats moms cooking Complains of chest pains on either side of the breastbone intermittently for the past 1 month.  He feels it most when he first wakes up in the mornings and in the evenings after work when he is about to lay down.  He works for [employer] and does a lot of lifting and walking throughout the day.  He does not notice it much during the day.  Pain does not radiate down the arms of the neck.  Yesterday he took some ibuprofen and it went away.  Vit D:  taking once wkly high-dose vitamin D as prescribed  ED:  Has appt with urology 07/02/2022  Smoking 3-4 cigars a wk from 4 a day.  Trying to wean off it.    Now has medicaid.  He has called and received a list of dentists in the area who accepts Medicaid  HM:  Has not use and turn in FIT test.  Plans to have it turned in by the end of the week.  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 90 tablet 1   carvedilol (COREG) 25 MG tablet Take 1 tablet (25 mg total) by mouth 2 (two) times daily with a meal. 180 tablet 1   cholecalciferol (VITAMIN D3) 25 MCG (1000 UNIT) tablet Take 1 tablet (1,000 Units total) by mouth daily. 60 tablet 1   losartan (COZAAR) 100 MG tablet Take 1 tablet (100 mg total) by mouth daily. 90 tablet 1   mupirocin ointment (BACTROBAN) 2 % Apply 1 Application topically 2 (two) times daily. 22 g 0   naproxen (NAPROSYN) 500 MG tablet Take 1 tablet (500 mg total) by mouth 2 (two) times daily with a meal. FOR LOW BACK PAIN 30 tablet 0   oxymetazoline (AFRIN NASAL SPRAY) 0.05 % nasal spray 1 spray each nostril daily x 2 days then PRN.  Not to be use on a continuous daily bases 30 mL 0   sildenafil (VIAGRA) 100 MG tablet Take 0.5-1 tablets (50-100 mg total) by mouth daily as needed for erectile dysfunction. 10 tablet 11   spironolactone (ALDACTONE) 50 MG tablet Take 1 tablet (50 mg total)  by mouth daily. 90 tablet 1   Vitamin D, Ergocalciferol, (DRISDOL) 1.25 MG (50000 UNIT) CAPS capsule Take 1 capsule (50,000 Units total) by mouth every 7 (seven) days. 12 capsule 1   diclofenac Sodium (VOLTAREN) 1 % GEL Apply 2 g topically 4 (four) times daily. (Patient not taking: Reported on 06/11/2022) 100 g 2   No current facility-administered medications on file prior to visit.    Allergies  Allergen Reactions   Penicillins Swelling and Rash    Has patient had a PCN reaction causing immediate rash, facial/tongue/throat swelling, SOB or lightheadedness with hypotension: Yes/No:[id number] Yes Has patient had a PCN reaction causing severe rash involving mucus membranes or skin necrosis: Yes/No:[id number] No Has patient had a PCN reaction that required hospitalization Yes/No:[id number]  Yes Has patient had a PCN reaction occurring within the last 10 years: Yes/No:[id number] No If all of the above answers are "NO", then may proceed with Cephalospori    Social History   Socioeconomic History   Marital status: Single    Spouse name: Not on file   Number of children: Not on file   Years of education: Not on file   Highest education level: Not on file  Occupational History   Not on file  Tobacco Use   Smoking status: Every Day    Packs/day: 1    Types: Cigars, Cigarettes    Last attempt to quit: 05/10/2015    Years since quitting: 7.0   Smokeless tobacco: Never  Vaping Use   Vaping Use: Never used  Substance and Sexual Activity   Alcohol use: Yes    Comment: occ   Drug use: Yes    Types: Marijuana   Sexual activity: Not on file  Other Topics Concern   Not on file  Social History Narrative   Not on file   Social Determinants of Health   Financial Resource Strain: Low Risk  (05/31/2022)   Overall Financial Resource Strain (CARDIA)    Difficulty of Paying Living Expenses: Not very hard  Food Insecurity: No Food Insecurity (05/31/2022)   Hunger Vital Sign    Worried About Running Out of Food in the Last Year: Never true    Ran Out of Food in the Last Year: Never true  Transportation Needs: No Transportation Needs (05/31/2022)   PRAPARE - Hydrologist (Medical): No    Lack of Transportation (Non-Medical): No  Physical Activity: Inactive (05/31/2022)   Exercise Vital Sign    Days of Exercise per Week: 0 days    Minutes of Exercise per Session: 0 min  Stress: No Stress Concern Present (05/31/2022)   [place]    Feeling of Stress : Only a little  Social Connections: Moderately Isolated (05/31/2022)   Social Connection and Isolation Panel [NHANES]    Frequency of Communication with Friends and Family: Twice a week    Frequency of Social Gatherings with Friends and  Family: Twice a week    Attends Religious Services: Never    Marine scientist or Organizations: No    Attends Archivist Meetings: Never    Marital Status: Living with partner  Intimate Partner Violence: Not At Risk (05/31/2022)   Humiliation, Afraid, Rape, and Kick questionnaire    Fear of Current or Ex-Partner: No    Emotionally Abused: No    Physically Abused: No    Sexually Abused: No    Family History  Problem Relation Age of Onset  Cancer Mother     Past Surgical History:  Procedure Laterality Date   CHOLECYSTECTOMY      ROS: Review of Systems Negative except as stated above  PHYSICAL EXAM: BP (!) 136/90   Pulse 78   Ht 6' 2.5" (1.892 m)   Wt 200 lb 9.6 oz (91 kg)   SpO2 99%   BMI 25.41 kg/m   Physical Exam   General appearance - alert, well appearing, and in no distress Mental status - normal mood, behavior, speech, dress, motor activity, and thought processes Neck - supple, no significant adenopathy Chest - clear to auscultation, no wheezes, rales or rhonchi, symmetric air entry.  No reproducible tenderness on palpation of the anterior chest wall Heart - normal rate, regular rhythm, normal S1, S2, no murmurs, rubs, clicks or gallops Extremities - peripheral pulses normal, no pedal edema, no clubbing or cyanosis  EKG: Normal sinus rhythm with first-degree AV block, LVH, nonspecific ST T abnormalities essentially unchanged    Latest Ref Rng & Units 04/06/2022    9:13 AM 12/08/2021   10:44 AM 08/14/2021    4:33 PM  CMP  Glucose 70 - 99 mg/dL 128  114  91   BUN 6 - 20 mg/dL 14  16  8    Creatinine 0.61 - 1.24 mg/dL 0.86  0.93  1.03   Sodium 135 - 145 mmol/L 135  140  142   Potassium 3.5 - 5.1 mmol/L 4.1  4.5  4.2   Chloride 98 - 111 mmol/L 101  104  106   CO2 22 - 32 mmol/L 25  22  26    Calcium 8.9 - 10.3 mg/dL 9.1  10.1  9.3    Lipid Panel     Component Value Date/Time   CHOL 129 10/19/2020 1136   TRIG 114 10/19/2020 1136   HDL 39 (L)  10/19/2020 1136   CHOLHDL 3.3 10/19/2020 1136   LDLCALC 69 10/19/2020 1136    CBC    Component Value Date/Time   WBC 5.9 04/06/2022 0913   RBC 5.70 04/06/2022 0913   HGB 16.9 04/06/2022 0913   HGB 17.4 04/05/2022 1010   HCT 49.5 04/06/2022 0913   HCT 51.9 (H) 04/05/2022 1010   PLT 268 04/06/2022 0913   PLT 285 04/05/2022 1010   MCV 86.8 04/06/2022 0913   MCV 86 04/05/2022 1010   MCH 29.6 04/06/2022 0913   MCHC 34.1 04/06/2022 0913   RDW 13.3 04/06/2022 0913   RDW 12.9 04/05/2022 1010   LYMPHSABS 2.6 04/06/2022 0913   LYMPHSABS 2.7 04/05/2022 1010   MONOABS 0.4 04/06/2022 0913   EOSABS 0.1 04/06/2022 0913   EOSABS 0.1 04/05/2022 1010   BASOSABS 0.0 04/06/2022 0913   BASOSABS 0.0 04/05/2022 1010    ASSESSMENT AND PLAN: 1. Essential hypertension Not at goal but repeat blood pressure today better than it was initially and on previous visit. He will continue current medications listed above  2. Chest pain in adult Sounds musculoskeletal in nature but patient does have risk factors for heart disease.  I recommend taking Tylenol as needed.  Refill given on Robaxin. Referral to cardiology - EKG 12-Lead - methocarbamol (ROBAXIN) 500 MG tablet; Take 1 tablet (500 mg total) by mouth 2 (two) times daily as needed for muscle spasms.  Dispense: 30 tablet; Refill: 0 - Ambulatory referral to Cardiology  3. Vitamin D deficiency Continue once weekly high-dose vitamin D  4. Erectile dysfunction, unspecified erectile dysfunction type Keep upcoming appointment with urology  5.  Tobacco dependence Commended him on cutting back.  Strongly encouraged him to quit which he states he is working on doing.  6. Screening for colon cancer Strongly encouraged him to use and turn in the fit test which he states he will do by the end of this week.  Offered him other options for colon cancer screening now that he has insurance including Cologuard test or referral for colonoscopy.  Patient states he  prefers to do the fit test.    Patient was given the opportunity to ask questions.  Patient verbalized understanding of the plan and was able to repeat key elements of the plan.   This documentation was completed using Radio producer.  Any transcriptional errors are unintentional.  Orders Placed This Encounter  Procedures   Ambulatory referral to Cardiology   EKG 12-Lead     Requested Prescriptions   Signed Prescriptions Disp Refills   methocarbamol (ROBAXIN) 500 MG tablet 30 tablet 0    Sig: Take 1 tablet (500 mg total) by mouth 2 (two) times daily as needed for muscle spasms.    Return in about 3 months (around 09/10/2022).  Karle Plumber, MD, FACP

## 2022-06-11 NOTE — Progress Notes (Signed)
[phone] 

## 2022-06-12 ENCOUNTER — Encounter: Payer: Medicaid Other | Admitting: Urology

## 2022-06-14 ENCOUNTER — Encounter: Payer: Self-pay | Admitting: Pharmacist

## 2022-06-18 ENCOUNTER — Telehealth: Payer: Self-pay | Admitting: Internal Medicine

## 2022-06-18 ENCOUNTER — Ambulatory Visit: Payer: Self-pay

## 2022-06-18 ENCOUNTER — Other Ambulatory Visit: Payer: Self-pay

## 2022-06-18 NOTE — Telephone Encounter (Signed)
  Chief Complaint: Tooth pain - infection Symptoms: Pain infection lower left side Frequency: since 06/11/2022 Pertinent Negatives: Patient denies  Disposition: [] ED /[] Urgent Care (no appt availability in office) / [] Appointment(In office/virtual)/ []  [facility]/ [] Home Care/ [] Refused Recommended Disposition /[] [facility]/ [x]  Follow-up with PCP Additional Notes: Pt was seen 06/11/2022. Pt states that mouth pain/infection started later that day. Pt would like antibiotics. Pt states that going to the dentist was discussed at recent appt. Pt needs to see several other specialists and would like to get those out of the way before going to the dentist. Please advise.   Summary: Abscess Advice   Pt is calling to request script for abscess for clindamycin (CLEOCIN) 150 MG capsule [[id number]]  ENDED. Pain 8/10 Advised to send clinical call NT Please advise     Reason for Disposition  Toothache present > 24 hours  Answer Assessment - Initial Assessment Questions 1. LOCATION: "Which tooth is hurting?"  (e.g., right-side/left-side, upper/lower, front/back)     Lower left 2. ONSET: "When did the toothache start?"  (e.g., hours, days)      06/11/2022 3. SEVERITY: "How bad is the toothache?"  (Scale 1-10; mild, moderate or severe)   - MILD (1-3): doesn't interfere with chewing    - MODERATE (4-7): interferes with chewing, interferes with normal activities, awakens from sleep     - SEVERE (8-10): unable to eat, unable to do any normal activities, excruciating pain        8-10/10 4. SWELLING: "Is there any visible swelling of your face?"      5. OTHER SYMPTOMS: "Do you have any other symptoms?" (e.g., fever)     pain  Protocols used: Toothache-A-AH

## 2022-06-18 NOTE — Telephone Encounter (Signed)
Summary: Abscess Advice   Pt is calling to request script for abscess for clindamycin (CLEOCIN) 150 MG capsule [[id number]]  ENDED. Pain 8/10 Advised to send clinical call NT Please advise    Voice mailbox is not set up - unable to leave a message.

## 2022-06-20 ENCOUNTER — Other Ambulatory Visit: Payer: Self-pay

## 2022-06-20 NOTE — Telephone Encounter (Signed)
Called & spoke to the patient. Verified name & DOB. Scheduled appointment for 06/21/2022. Patient agreed and confirmed appointment.

## 2022-06-20 NOTE — Telephone Encounter (Signed)
Pt requesting antibiotic for dental pain

## 2022-06-21 ENCOUNTER — Encounter: Payer: Self-pay | Admitting: Internal Medicine

## 2022-06-21 ENCOUNTER — Other Ambulatory Visit: Payer: Self-pay

## 2022-06-21 ENCOUNTER — Ambulatory Visit: Payer: Medicaid Other | Attending: Internal Medicine | Admitting: Internal Medicine

## 2022-06-21 VITALS — BP 137/88 | HR 78 | Temp 98.3°F | Ht 74.0 in | Wt 203.0 lb

## 2022-06-21 DIAGNOSIS — R229 Localized swelling, mass and lump, unspecified: Secondary | ICD-10-CM | POA: Insufficient documentation

## 2022-06-21 DIAGNOSIS — G8929 Other chronic pain: Secondary | ICD-10-CM | POA: Insufficient documentation

## 2022-06-21 DIAGNOSIS — Z79899 Other long term (current) drug therapy: Secondary | ICD-10-CM | POA: Insufficient documentation

## 2022-06-21 DIAGNOSIS — K029 Dental caries, unspecified: Secondary | ICD-10-CM

## 2022-06-21 DIAGNOSIS — F17290 Nicotine dependence, other tobacco product, uncomplicated: Secondary | ICD-10-CM | POA: Diagnosis not present

## 2022-06-21 DIAGNOSIS — Z7982 Long term (current) use of aspirin: Secondary | ICD-10-CM | POA: Diagnosis not present

## 2022-06-21 DIAGNOSIS — K0889 Other specified disorders of teeth and supporting structures: Secondary | ICD-10-CM | POA: Insufficient documentation

## 2022-06-21 DIAGNOSIS — R0789 Other chest pain: Secondary | ICD-10-CM | POA: Insufficient documentation

## 2022-06-21 DIAGNOSIS — I10 Essential (primary) hypertension: Secondary | ICD-10-CM | POA: Diagnosis not present

## 2022-06-21 DIAGNOSIS — R079 Chest pain, unspecified: Secondary | ICD-10-CM | POA: Diagnosis not present

## 2022-06-21 DIAGNOSIS — R7303 Prediabetes: Secondary | ICD-10-CM | POA: Insufficient documentation

## 2022-06-21 MED ORDER — CLINDAMYCIN HCL 150 MG PO CAPS
150.0000 mg | ORAL_CAPSULE | Freq: Three times a day (TID) | ORAL | 0 refills | Status: DC
  Filled 2022-06-21 – 2022-07-09 (×2): qty 21, 7d supply, fill #0

## 2022-06-21 NOTE — Patient Instructions (Signed)
Try to get an appointment with a dentist as soon as possible.  I recommend taking an enteric-coated baby aspirin 81 mg daily until you see the cardiologist.

## 2022-06-21 NOTE — Progress Notes (Signed)
Patient ID: [name], male    DOB: 10-21-1975  MRN: [id number]  CC: Dental Pain (Dental pain X2 weeks. /Intermittent L chest pain, reports that it feels muscular X2 days, stiff left arm, running out of breath faster than normal. )   Subjective: [name] is a 47 y.o. male who presents for UC visit His concerns today include:  Hx of poorly controlled  HTN, preDM, tob dep, poor dentition, chronic lower back pain, history of reflex syncope, callus of left Achilles tendon.   C/o Dental pain on LT side X2 weeks.  Some swelling and broken tooth He did get list of dental practices in his network.  Working on trying to find one   On last visit 06/11/2022 pt reported:  chest pains on either side of the breastbone intermittently for the past 1 month. He feels it most when he first wakes up in the mornings and in the evenings after work when he is about to lay down. He works for [employer] and does a lot of lifting and walking throughout the day. He does not notice it much during the day. Pain does not radiate down the arms of the neck.  EKG revealed   Normal sinus rhythm with first-degree AV block, LVH, nonspecific ST T abnormalities essentially unchanged.  Pt referred to cardiology.  Appt scheduled 07/09/2022.  Strongly advised to quit smoking.  Today: Reports some intermittent LT side CP; has 2x this wk so far.  Had an episode while driving over her today Chest tend to be sore and LT arm feels tight when he gets off work Taking his BP meds as prescribed.  Still smokes cigars  Patient Active Problem List   Diagnosis Date Noted   Neck pain 08/14/2021   Prediabetes 01/28/2021   Vitamin D deficiency 01/28/2021   Pre-ulcerative corn or callous 02/01/2019   Disorder of left Achilles tendon 02/01/2019   Inguinal hernia of left side without obstruction or gangrene 12/08/2016   Former tobacco use 12/08/2016   Localized swelling, mass and lump, multiple sites 12/08/2016   Subcutaneous nodules  07/09/2016   Periodontitis 05/27/2015   Pain in left wrist 05/27/2015   HTN (hypertension) 05/12/2015     Current Outpatient Medications on File Prior to Visit  Medication Sig Dispense Refill   amLODipine (NORVASC) 10 MG tablet Take 1 tablet (10 mg total) by mouth daily. 90 tablet 1   carvedilol (COREG) 25 MG tablet Take 1 tablet (25 mg total) by mouth 2 (two) times daily with a meal. 180 tablet 1   cholecalciferol (VITAMIN D3) 25 MCG (1000 UNIT) tablet Take 1 tablet (1,000 Units total) by mouth daily. 60 tablet 1   losartan (COZAAR) 100 MG tablet Take 1 tablet (100 mg total) by mouth daily. 90 tablet 1   methocarbamol (ROBAXIN) 500 MG tablet Take 1 tablet (500 mg total) by mouth 2 (two) times daily as needed for muscle spasms. 30 tablet 0   naproxen (NAPROSYN) 500 MG tablet Take 1 tablet (500 mg total) by mouth 2 (two) times daily with a meal. FOR LOW BACK PAIN 30 tablet 0   oxymetazoline (AFRIN NASAL SPRAY) 0.05 % nasal spray 1 spray each nostril daily x 2 days then PRN.  Not to be use on a continuous daily bases 30 mL 0   spironolactone (ALDACTONE) 50 MG tablet Take 1 tablet (50 mg total) by mouth daily. 90 tablet 1   Vitamin D, Ergocalciferol, (DRISDOL) 1.25 MG (50000 UNIT) CAPS capsule Take  1 capsule (50,000 Units total) by mouth every 7 (seven) days. 12 capsule 1   diclofenac Sodium (VOLTAREN) 1 % GEL Apply 2 g topically 4 (four) times daily. (Patient not taking: Reported on 06/11/2022) 100 g 2   sildenafil (VIAGRA) 100 MG tablet Take 0.5-1 tablets (50-100 mg total) by mouth daily as needed for erectile dysfunction. (Patient not taking: Reported on 06/21/2022) 10 tablet 11   No current facility-administered medications on file prior to visit.    Allergies  Allergen Reactions   Penicillins Swelling and Rash    Has patient had a PCN reaction causing immediate rash, facial/tongue/throat swelling, SOB or lightheadedness with hypotension: Yes/No:[id number] Yes Has patient had a PCN reaction  causing severe rash involving mucus membranes or skin necrosis: Yes/No:[id number] No Has patient had a PCN reaction that required hospitalization Yes/No:[id number] Yes Has patient had a PCN reaction occurring within the last 10 years: Yes/No:[id number] No If all of the above answers are "NO", then may proceed with Cephalospori    Social History   Socioeconomic History   Marital status: Single    Spouse name: Not on file   Number of children: Not on file   Years of education: Not on file   Highest education level: Not on file  Occupational History   Not on file  Tobacco Use   Smoking status: Every Day    Packs/day: 1    Types: Cigars, Cigarettes    Last attempt to quit: 05/10/2015    Years since quitting: 7.1   Smokeless tobacco: Never  Vaping Use   Vaping Use: Never used  Substance and Sexual Activity   Alcohol use: Yes    Comment: occ   Drug use: Yes    Types: Marijuana   Sexual activity: Not on file  Other Topics Concern   Not on file  Social History Narrative   Not on file   Social Determinants of Health   Financial Resource Strain: Low Risk  (05/31/2022)   Overall Financial Resource Strain (CARDIA)    Difficulty of Paying Living Expenses: Not very hard  Food Insecurity: No Food Insecurity (05/31/2022)   Hunger Vital Sign    Worried About Running Out of Food in the Last Year: Never true    Ran Out of Food in the Last Year: Never true  Transportation Needs: No Transportation Needs (05/31/2022)   PRAPARE - Administrator, Civil ServiceTransportation    Lack of Transportation (Medical): No    Lack of Transportation (Non-Medical): No  Physical Activity: Inactive (05/31/2022)   Exercise Vital Sign    Days of Exercise per Week: 0 days    Minutes of Exercise per Session: 0 min  Stress: No Stress Concern Present (05/31/2022)   [employer] - Occupational Stress Questionnaire    Feeling of Stress : Only a little  Social Connections: Moderately Isolated (05/31/2022)   Social  Connection and Isolation Panel [NHANES]    Frequency of Communication with Friends and Family: Twice a week    Frequency of Social Gatherings with Friends and Family: Twice a week    Attends Religious Services: Never    Database administratorActive Member of Clubs or Organizations: No    Attends BankerClub or Organization Meetings: Never    Marital Status: Living with partner  Intimate Partner Violence: Not At Risk (05/31/2022)   Humiliation, Afraid, Rape, and Kick questionnaire    Fear of Current or Ex-Partner: No    Emotionally Abused: No    Physically Abused: No    Sexually Abused: No  Family History  Problem Relation Age of Onset   Cancer Mother     Past Surgical History:  Procedure Laterality Date   CHOLECYSTECTOMY      ROS: Review of Systems Negative except as stated above  PHYSICAL EXAM: BP 137/88 (BP Location: Left Arm, Patient Position: Sitting, Cuff Size: Normal)   Pulse 78   Temp 98.3 F (36.8 C) (Oral)   Ht 6\' 2"  (1.88 m)   Wt 203 lb (92.1 kg)   SpO2 99%   BMI 26.06 kg/m   Physical Exam   General appearance - alert, well appearing, and in no distress Mental status - normal mood, behavior, speech, dress, motor activity, and thought processes Mouth -patient with several decayed teeth broken off in the gum.  Tooth that is bothersome at this time is the wisdom tooth in the left lower jaw that is broken off in gum  He has mild jaw swelling. Chest - clear to auscultation, no wheezes, rales or rhonchi, symmetric air entry Heart - normal rate, regular rhythm, normal S1, S2, no murmurs, rubs, clicks or gallops.  No reproducible tenderness on palpation of anterior chest wall at this time.     Latest Ref Rng & Units 04/06/2022    9:13 AM 12/08/2021   10:44 AM 08/14/2021    4:33 PM  CMP  Glucose 70 - 99 mg/dL 007  121  91   BUN 6 - 20 mg/dL 14  16  8    Creatinine 0.61 - 1.24 mg/dL 9.75  8.83  2.54   Sodium 135 - 145 mmol/L 135  140  142   Potassium 3.5 - 5.1 mmol/L 4.1  4.5  4.2    Chloride 98 - 111 mmol/L 101  104  106   CO2 22 - 32 mmol/L 25  22  26    Calcium 8.9 - 10.3 mg/dL 9.1  98.2  9.3    Lipid Panel     Component Value Date/Time   CHOL 129 10/19/2020 1136   TRIG 114 10/19/2020 1136   HDL 39 (L) 10/19/2020 1136   CHOLHDL 3.3 10/19/2020 1136   LDLCALC 69 10/19/2020 1136    CBC    Component Value Date/Time   WBC 5.9 04/06/2022 0913   RBC 5.70 04/06/2022 0913   HGB 16.9 04/06/2022 0913   HGB 17.4 04/05/2022 1010   HCT 49.5 04/06/2022 0913   HCT 51.9 (H) 04/05/2022 1010   PLT 268 04/06/2022 0913   PLT 285 04/05/2022 1010   MCV 86.8 04/06/2022 0913   MCV 86 04/05/2022 1010   MCH 29.6 04/06/2022 0913   MCHC 34.1 04/06/2022 0913   RDW 13.3 04/06/2022 0913   RDW 12.9 04/05/2022 1010   LYMPHSABS 2.6 04/06/2022 0913   LYMPHSABS 2.7 04/05/2022 1010   MONOABS 0.4 04/06/2022 0913   EOSABS 0.1 04/06/2022 0913   EOSABS 0.1 04/05/2022 1010   BASOSABS 0.0 04/06/2022 0913   BASOSABS 0.0 04/05/2022 1010   EKG: Normal sinus rhythm, LVH, nonspecific T wave abnormalities.  Unchanged from previous EKG done earlier this month. ASSESSMENT AND PLAN: 1. Pain due to dental caries - clindamycin (CLEOCIN) 150 MG capsule; Take 1 capsule (150 mg total) by mouth 3 (three) times daily.  Dispense: 21 capsule; Refill: 0  2. Chest pain in adult EKG is unchanged from previous. Patient has risk factors for heart disease. Strongly encouraged him to discontinue smoking. Recommend taking a baby aspirin daily until he sees the cardiologist later this month. - EKG 12-Lead  Patient was given the opportunity to ask questions.  Patient verbalized understanding of the plan and was able to repeat key elements of the plan.   This documentation was completed using Paediatric nurse.  Any transcriptional errors are unintentional.  Orders Placed This Encounter  Procedures   EKG 12-Lead     Requested Prescriptions   Signed Prescriptions Disp Refills    clindamycin (CLEOCIN) 150 MG capsule 21 capsule 0    Sig: Take 1 capsule (150 mg total) by mouth 3 (three) times daily.    No follow-ups on file.  Jonah Blue, MD, FACP

## 2022-06-27 ENCOUNTER — Other Ambulatory Visit: Payer: Self-pay

## 2022-06-27 ENCOUNTER — Other Ambulatory Visit (HOSPITAL_BASED_OUTPATIENT_CLINIC_OR_DEPARTMENT_OTHER): Payer: Self-pay

## 2022-07-01 NOTE — Progress Notes (Unsigned)
   Assessment: 1. Erectile dysfunction of organic origin      Plan: Today I had a long discussion with the patient regarding his erectile dysfunction using a goal oriented approach.  We discussed standard treatment options including oral medications, vacuum erection devices, penile injection therapy, penile implant surgery. Following our discussion the patient is going to continue using the Viagra.  I did discuss with him the importance of proper utilization including proper timing as well as taking it on an empty stomach.  He will let us know how this plays out.  Follow-up as needed.  Chief Complaint: ED  History of Present Illness:  [name] is a 47 y.o. male with a PMHx of poorly controlled  HTN, preDM, tobacco dep, poor dentition, chronic lower back pain, history of reflex syncope  who is seen in consultation from [name], MD for evaluation of ED. Patient reports that over the last 4 months he has noticed onset of ED.  He states that he is able to get an erection but often times it is not fully rigid and sometimes he loses it prior to orgasm. Has been prescribed sildenafil  He states that he is not sure if this helps much although he does think he has somewhat improved. No LUTS-    Past Medical History:  Past Medical History:  Diagnosis Date   Hypertension 2003   dx age 40    Wears glasses     Past Surgical History:  Past Surgical History:  Procedure Laterality Date   CHOLECYSTECTOMY      Allergies:    Family History:  Family History  Problem Relation Age of Onset   Cancer Mother     Social History:  Social History   Tobacco Use   Smoking status: Every Day    Packs/day: 1    Types: Cigars, Cigarettes    Last attempt to quit: 05/10/2015    Years since quitting: 7.1   Smokeless tobacco: Never  Vaping Use   Vaping Use: Never used  Substance Use Topics   Alcohol use: Yes    Comment: occ   Drug use: Yes    Types: Marijuana    Review  of symptoms:  Constitutional:  Negative for unexplained weight loss, night sweats, fever, chills ENT:  Negative for nose bleeds, sinus pain, painful swallowing CV:  Negative for chest pain, shortness of breath, exercise intolerance, palpitations, loss of consciousness Resp:  Negative for cough, wheezing, shortness of breath GI:  Negative for nausea, vomiting, diarrhea, bloody stools GU:  Positives noted in HPI; otherwise negative for gross hematuria, dysuria, urinary incontinence Neuro:  Negative for seizures, poor balance, limb weakness, slurred speech Psych:  Negative for lack of energy, depression, anxiety Endocrine:  Negative for polydipsia, polyuria, symptoms of hypoglycemia (dizziness, hunger, sweating) Hematologic:  Negative for anemia, purpura, petechia, prolonged or excessive bleeding, use of anticoagulants  Allergic:  Negative for difficulty breathing or choking as a result of exposure to anything; no shellfish allergy; no allergic response (rash/itch) to materials, foods  Physical exam: Vitals:   07/02/22 0913  BP: (!) 146/99  Pulse: 76    GENERAL APPEARANCE:  Well appearing, well developed, well nourished, NAD  GU:  circ phallus with mild proximal ventral meatus.  No palpable plaque Testes and cords normal Nontender reducible left inguinal hernia

## 2022-07-02 ENCOUNTER — Ambulatory Visit (INDEPENDENT_AMBULATORY_CARE_PROVIDER_SITE_OTHER): Payer: Medicaid Other | Admitting: Urology

## 2022-07-02 ENCOUNTER — Encounter: Payer: Self-pay | Admitting: Urology

## 2022-07-02 VITALS — BP 146/99 | HR 76 | Ht 74.5 in | Wt 205.0 lb

## 2022-07-02 DIAGNOSIS — N529 Male erectile dysfunction, unspecified: Secondary | ICD-10-CM | POA: Diagnosis not present

## 2022-07-09 ENCOUNTER — Encounter: Payer: Self-pay | Admitting: Internal Medicine

## 2022-07-09 ENCOUNTER — Ambulatory Visit: Payer: Medicaid Other | Attending: Internal Medicine | Admitting: Internal Medicine

## 2022-07-09 ENCOUNTER — Other Ambulatory Visit: Payer: Self-pay

## 2022-07-09 VITALS — BP 128/68 | HR 68 | Ht 74.0 in | Wt 208.4 lb

## 2022-07-09 DIAGNOSIS — R079 Chest pain, unspecified: Secondary | ICD-10-CM

## 2022-07-09 NOTE — Patient Instructions (Addendum)
Medication Instructions:  Your physician recommends that you continue on your current medications as directed. Please refer to the Current Medication list given to you today.  *If you need a refill on your cardiac medications before your next appointment, please call your pharmacy*   Lab Work: None   Testing/Procedures:   Exercise Stress Test   Please arrive 15 minutes prior to your appointment time for registration and insurance purposes.   The test will take approximately 45 minutes to complete.   How to prepare for your Exercise Stress Test: Do bring a list of your current medications with you.  If not listed below, you may take your medications as normal. Do wear comfortable clothes (no dresses or overalls) and walking shoes, tennis shoes preferred (no heels or open toed shoes are allowed) Do Not wear cologne, perfume, aftershave or lotions (deodorant is allowed). Please report to [address] [facility], [address] for your test.   If these instructions are not followed, your test will have to be rescheduled.   If you have questions or concerns about your appointment, you can call the Stress Lab at [phone].   If you cannot keep your appointment, please provide 24 hours notification to the Stress Lab, to avoid a possible $50 charge to your account.    Follow-Up: At [facility], you and your health needs are our priority.  As part of our continuing mission to provide you with exceptional heart care, we have created designated Provider Care Teams.  These Care Teams include your primary Cardiologist (physician) and Advanced Practice Providers (APPs -  Physician Assistants and Nurse Practitioners) who all work together to provide you with the care you need, when you need it.  Your next appointment:    As needed pending results  Provider:   Maisie Fus, MD

## 2022-07-09 NOTE — Progress Notes (Signed)
Cardiology Office Note:    Date:  07/09/2022   ID:  [name], DOB Jan 24, 1976, MRN [id number]  PCP:  [name] Matar, MD   [facility] Providers Cardiologist:  None     Referring MD: [name] Matar, MD   No chief complaint on file. CP  History of Present Illness:    [name] is a 47 y.o. male with a hx of smoking,  referral to cardiology for CP.   He notes when is going to bed at night and when he wakes up in the AM. His arm can feel tingling. His left arm.  He works at [employer] It is a lot of walking. His lifts boxes.  It is relieved with tylenol. EKG 1st AV blocking  He smokes cigars. No cigarette smoking.  No family hx of cardiac CP  Blood pressures is well controlled.  04/27/2022 A1c 5.9%  10/19/2020 LDL 69 mg/dL   Past Medical History:  Diagnosis Date   Hypertension 2003   dx age 22    Wears glasses     Past Surgical History:  Procedure Laterality Date   CHOLECYSTECTOMY      Current Medications: Current Outpatient Medications on File Prior to Visit  Medication Sig Dispense Refill   amLODipine (NORVASC) 10 MG tablet Take 1 tablet (10 mg total) by mouth daily. 90 tablet 1   carvedilol (COREG) 25 MG tablet Take 1 tablet (25 mg total) by mouth 2 (two) times daily with a meal. 180 tablet 1   losartan (COZAAR) 100 MG tablet Take 1 tablet (100 mg total) by mouth daily. 90 tablet 1   methocarbamol (ROBAXIN) 500 MG tablet Take 1 tablet (500 mg total) by mouth 2 (two) times daily as needed for muscle spasms. 30 tablet 0   oxymetazoline (AFRIN NASAL SPRAY) 0.05 % nasal spray 1 spray each nostril daily x 2 days then PRN.  Not to be use on a continuous daily bases 30 mL 0   spironolactone (ALDACTONE) 50 MG tablet Take 1 tablet (50 mg total) by mouth daily. 90 tablet 1   Vitamin D, Ergocalciferol, (DRISDOL) 1.25 MG (50000 UNIT) CAPS capsule Take 1 capsule (50,000 Units total) by mouth every 7 (seven) days. 12 capsule 1   cholecalciferol (VITAMIN  D3) 25 MCG (1000 UNIT) tablet Take 1 tablet (1,000 Units total) by mouth daily. (Patient not taking: Reported on 07/09/2022) 60 tablet 1   clindamycin (CLEOCIN) 150 MG capsule Take 1 capsule (150 mg total) by mouth 3 (three) times daily. (Patient not taking: Reported on 07/09/2022) 21 capsule 0   diclofenac Sodium (VOLTAREN) 1 % GEL Apply 2 g topically 4 (four) times daily. (Patient not taking: Reported on 07/09/2022) 100 g 2   naproxen (NAPROSYN) 500 MG tablet Take 1 tablet (500 mg total) by mouth 2 (two) times daily with a meal. FOR LOW BACK PAIN (Patient not taking: Reported on 07/09/2022) 30 tablet 0   sildenafil (VIAGRA) 100 MG tablet Take 0.5-1 tablets (50-100 mg total) by mouth daily as needed for erectile dysfunction. (Patient not taking: Reported on 07/09/2022) 10 tablet 11   No current facility-administered medications on file prior to visit.    Allergies:   Penicillins   Social History   Socioeconomic History   Marital status: Single    Spouse name: Not on file   Number of children: Not on file   Years of education: Not on file   Highest education level: Not on file  Occupational History  Not on file  Tobacco Use   Smoking status: Every Day    Packs/day: 1    Types: Cigars, Cigarettes    Last attempt to quit: 05/10/2015    Years since quitting: 7.1   Smokeless tobacco: Never  Vaping Use   Vaping Use: Never used  Substance and Sexual Activity   Alcohol use: Yes    Comment: occ   Drug use: Yes    Types: Marijuana   Sexual activity: Not on file  Other Topics Concern   Not on file  Social History Narrative   Not on file   Social Determinants of Health   Financial Resource Strain: Low Risk  (05/31/2022)   Overall Financial Resource Strain (CARDIA)    Difficulty of Paying Living Expenses: Not very hard  Food Insecurity: No Food Insecurity (05/31/2022)   Hunger Vital Sign    Worried About Running Out of Food in the Last Year: Never true    Ran Out of Food in the Last  Year: Never true  Transportation Needs: No Transportation Needs (05/31/2022)   PRAPARE - Administrator, Civil Service (Medical): No    Lack of Transportation (Non-Medical): No  Physical Activity: Inactive (05/31/2022)   Exercise Vital Sign    Days of Exercise per Week: 0 days    Minutes of Exercise per Session: 0 min  Stress: No Stress Concern Present (05/31/2022)   [employer] of Occupational Health - Occupational Stress Questionnaire    Feeling of Stress : Only a little  Social Connections: Moderately Isolated (05/31/2022)   Social Connection and Isolation Panel [NHANES]    Frequency of Communication with Friends and Family: Twice a week    Frequency of Social Gatherings with Friends and Family: Twice a week    Attends Religious Services: Never    Database administrator or Organizations: No    Attends Engineer, structural: Never    Marital Status: Living with partner     Family History: The patient's family history includes Cancer in his mother.  ROS:   Please see the history of present illness.     All other systems reviewed and are negative.  EKGs/Labs/Other Studies Reviewed:    The following studies were reviewed today:   EKG:  EKG is  ordered today.  The ekg ordered today demonstrates  None today  Recent Labs: 12/08/2021: TSH 0.564 04/06/2022: BUN 14; Creatinine, Ser 0.86; Hemoglobin 16.9; Platelets 268; Potassium 4.1; Sodium 135  Recent Lipid Panel    Component Value Date/Time   CHOL 129 10/19/2020 1136   TRIG 114 10/19/2020 1136   HDL 39 (L) 10/19/2020 1136   CHOLHDL 3.3 10/19/2020 1136   LDLCALC 69 10/19/2020 1136     Risk Assessment/Calculations:    Physical Exam:    VS:     Vitals:   07/09/22 0951  BP: 128/68  Pulse: 68  SpO2: 98%     Wt Readings from Last 3 Encounters:  07/02/22 205 lb (93 kg)  06/21/22 203 lb (92.1 kg)  06/11/22 200 lb 9.6 oz (91 kg)     GEN:  Well nourished, well developed in no acute  distress HEENT: Normal NECK: No JVD; No carotid bruits CARDIAC: RRR, no murmurs, rubs, gallops RESPIRATORY:  Clear to auscultation without rales, wheezing or rhonchi  ABDOMEN: Soft, non-tender, non-distended MUSCULOSKELETAL:  No edema; No deformity  SKIN: Warm and dry NEUROLOGIC:  Alert and oriented x 3 PSYCHIATRIC:  Normal affect   ASSESSMENT:  Possible CP: low risk factors, symptoms can be correlated with lifting boxes which can be typical angina. Will plan for an exercise stress test. If negative, he can stop his aspirin and symptoms likely MSK  HTN: continue current medications PLAN:    In order of problems listed above:   POET Follow up pending results      Shared Decision Making/Informed Consent The risks [chest pain, shortness of breath, cardiac arrhythmias, dizziness, blood pressure fluctuations, myocardial infarction, stroke/transient ischemic attack, and life-threatening complications (estimated to be 1 in 10,000)], benefits (risk stratification, diagnosing coronary artery disease, treatment guidance) and alternatives of an exercise tolerance test were discussed in detail with Mr. Clausen and he agrees to proceed.    Medication Adjustments/Labs and Tests Ordered: Current medicines are reviewed at length with the patient today.  Concerns regarding medicines are outlined above.  No orders of the defined types were placed in this encounter.  No orders of the defined types were placed in this encounter.   There are no Patient Instructions on file for this visit.   Signed, Maisie Fus, MD  07/09/2022 7:49 AM    [facility]

## 2022-07-13 ENCOUNTER — Other Ambulatory Visit: Payer: Self-pay

## 2022-07-15 ENCOUNTER — Telehealth: Payer: Medicaid Other | Admitting: Nurse Practitioner

## 2022-07-15 DIAGNOSIS — M5441 Lumbago with sciatica, right side: Secondary | ICD-10-CM

## 2022-07-15 DIAGNOSIS — G8929 Other chronic pain: Secondary | ICD-10-CM | POA: Diagnosis not present

## 2022-07-15 MED ORDER — NAPROXEN 500 MG PO TABS
500.0000 mg | ORAL_TABLET | Freq: Two times a day (BID) | ORAL | 0 refills | Status: DC
  Filled 2022-07-15: qty 60, 30d supply, fill #0

## 2022-07-15 MED ORDER — METHOCARBAMOL 500 MG PO TABS
500.0000 mg | ORAL_TABLET | Freq: Two times a day (BID) | ORAL | 0 refills | Status: DC | PRN
  Filled 2022-07-15 – 2022-10-18 (×5): qty 30, 15d supply, fill #0

## 2022-07-15 NOTE — Progress Notes (Signed)
Virtual Visit Consent   [name], you are scheduled for a virtual visit with a [name] provider today. Just as with appointments in the office, your consent must be obtained to participate. Your consent will be active for this visit and any virtual visit you may have with one of our providers in the next 365 days. If you have a MyChart account, a copy of this consent can be sent to you electronically.  As this is a virtual visit, video technology does not allow for your provider to perform a traditional examination. This may limit your provider's ability to fully assess your condition. If your provider identifies any concerns that need to be evaluated in person or the need to arrange testing (such as labs, EKG, etc.), we will make arrangements to do so. Although advances in technology are sophisticated, we cannot ensure that it will always work on either your end or our end. If the connection with a video visit is poor, the visit may have to be switched to a telephone visit. With either a video or telephone visit, we are not always able to ensure that we have a secure connection.  By engaging in this virtual visit, you consent to the provision of healthcare and authorize for your insurance to be billed (if applicable) for the services provided during this visit. Depending on your insurance coverage, you may receive a charge related to this service.  I need to obtain your verbal consent now. Are you willing to proceed with your visit today? [name] has provided verbal consent on 07/15/2022 for a virtual visit (video or telephone). Claiborne Rigg, NP  Date: 07/15/2022 8:58 AM  Virtual Visit via Video Note   I, Claiborne Rigg, connected with  [name]  ([id number], [birth year]) on 07/15/22 at  9:30 AM EDT by a video-enabled telemedicine application and verified that I am speaking with the correct person using two identifiers.  Location: Patient: Virtual Visit Location Patient:  Home Provider: Virtual Visit Location Provider: Home Office   I discussed the limitations of evaluation and management by telemedicine and the availability of in person appointments. The patient expressed understanding and agreed to proceed.    History of Present Illness: [name] is a 47 y.o. who identifies as a male who was assigned male at birth, and is being seen today for low back pain. He has a previous history of low back pain with right sided sciatica. States pain today despite taking methocarbamol is preventing him from going to work. His job involves heavy lifting with [employer]    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   Inguinal hernia of left side without obstruction or gangrene 12/08/2016   Former tobacco use 12/08/2016   Localized swelling, mass and lump, multiple sites 12/08/2016   Subcutaneous nodules 07/09/2016   Periodontitis 05/27/2015   Pain in left wrist 05/27/2015   HTN (hypertension) 05/12/2015    Allergies:  Allergies  Allergen Reactions   Penicillins Swelling and Rash    Has patient had a PCN reaction causing immediate rash, facial/tongue/throat swelling, SOB or lightheadedness with hypotensionYes Has patient had a PCN reaction causing severe rash involving mucus membranes or skin necrosis: No Has patient had a PCN reaction that required hospitalization Yes Has patient had  a PCN reaction occurring within the last 10 years: No If all of the above answers are "NO", then may proceed with Cephalospori   Medications:  Current Outpatient Medications:    amLODipine (NORVASC) 10 MG tablet, Take 1 tablet (10 mg total) by mouth daily., Disp: 90 tablet, Rfl: 1   carvedilol (COREG) 25 MG tablet, Take 1 tablet (25 mg total) by mouth 2 (two) times daily with a meal.,  Disp: 180 tablet, Rfl: 1   cholecalciferol (VITAMIN D3) 25 MCG (1000 UNIT) tablet, Take 1 tablet (1,000 Units total) by mouth daily. (Patient not taking: Reported on 07/09/2022), Disp: 60 tablet, Rfl: 1   clindamycin (CLEOCIN) 150 MG capsule, Take 1 capsule (150 mg total) by mouth 3 (three) times daily. (Patient not taking: Reported on 07/09/2022), Disp: 21 capsule, Rfl: 0   diclofenac Sodium (VOLTAREN) 1 % GEL, Apply 2 g topically 4 (four) times daily. (Patient not taking: Reported on 07/09/2022), Disp: 100 g, Rfl: 2   losartan (COZAAR) 100 MG tablet, Take 1 tablet (100 mg total) by mouth daily., Disp: 90 tablet, Rfl: 1   methocarbamol (ROBAXIN) 500 MG tablet, Take 1 tablet (500 mg total) by mouth 2 (two) times daily as needed for muscle spasms., Disp: 30 tablet, Rfl: 0   naproxen (NAPROSYN) 500 MG tablet, Take 1 tablet (500 mg total) by mouth 2 (two) times daily with a meal. FOR LOW BACK PAIN, Disp: 60 tablet, Rfl: 0   oxymetazoline (AFRIN NASAL SPRAY) 0.05 % nasal spray, 1 spray each nostril daily x 2 days then PRN.  Not to be use on a continuous daily bases, Disp: 30 mL, Rfl: 0   sildenafil (VIAGRA) 100 MG tablet, Take 0.5-1 tablets (50-100 mg total) by mouth daily as needed for erectile dysfunction. (Patient not taking: Reported on 07/09/2022), Disp: 10 tablet, Rfl: 11   spironolactone (ALDACTONE) 50 MG tablet, Take 1 tablet (50 mg total) by mouth daily., Disp: 90 tablet, Rfl: 1   Vitamin D, Ergocalciferol, (DRISDOL) 1.25 MG (50000 UNIT) CAPS capsule, Take 1 capsule (50,000 Units total) by mouth every 7 (seven) days., Disp: 12 capsule, Rfl: 1  Observations/Objective: Patient is well-developed, well-nourished in no acute distress.  Resting comfortably at home.  Head is normocephalic, atraumatic.  No labored breathing.  Speech is clear and coherent with logical content.  Patient is alert and oriented at baseline.    Assessment and Plan: 1. Chronic bilateral low back pain with right-sided  sciatica - methocarbamol (ROBAXIN) 500 MG tablet; Take 1 tablet (500 mg total) by mouth 2 (two) times daily as needed for muscle spasms.  Dispense: 30 tablet; Refill: 0 - naproxen (NAPROSYN) 500 MG tablet; Take 1 tablet (500 mg total) by mouth 2 (two) times daily with a meal. FOR LOW BACK PAIN  Dispense: 60 tablet; Refill: 0  Work on losing weight to help reduce back pain. May alternate with heat and ice application for pain relief. May also alternate with acetaminophen as prescribed for back pain. Other alternatives include massage, acupuncture and water aerobics.    Follow Up Instructions: I discussed the assessment and treatment plan with the patient. The patient was provided an opportunity to ask questions and all were answered. The patient agreed with the plan and demonstrated an understanding of the instructions.  A copy of instructions were sent to the patient via MyChart unless otherwise noted below.    The patient was advised to call back or seek an in-person evaluation if the symptoms worsen or if the  condition fails to improve as anticipated.  Time:  I spent 11 minutes with the patient via telehealth technology discussing the above problems/concerns.    Claiborne Rigg, NP

## 2022-07-15 NOTE — Patient Instructions (Signed)
[name], thank you for joining Claiborne Rigg, NP for today's virtual visit.  While this provider is not your primary care provider (PCP), if your PCP is located in our provider database this encounter information will be shared with them immediately following your visit.   A [facility] MyChart account gives you access to today's visit and all your visits, tests, and labs performed at [facility] " click here if you don't have a [facility] MyChart account or go to [email or web address]  Consent: (Patient) [name] provided verbal consent for this virtual visit at the beginning of the encounter.  Current Medications:  Current Outpatient Medications:    amLODipine (NORVASC) 10 MG tablet, Take 1 tablet (10 mg total) by mouth daily., Disp: 90 tablet, Rfl: 1   carvedilol (COREG) 25 MG tablet, Take 1 tablet (25 mg total) by mouth 2 (two) times daily with a meal., Disp: 180 tablet, Rfl: 1   cholecalciferol (VITAMIN D3) 25 MCG (1000 UNIT) tablet, Take 1 tablet (1,000 Units total) by mouth daily. (Patient not taking: Reported on 07/09/2022), Disp: 60 tablet, Rfl: 1   clindamycin (CLEOCIN) 150 MG capsule, Take 1 capsule (150 mg total) by mouth 3 (three) times daily. (Patient not taking: Reported on 07/09/2022), Disp: 21 capsule, Rfl: 0   diclofenac Sodium (VOLTAREN) 1 % GEL, Apply 2 g topically 4 (four) times daily. (Patient not taking: Reported on 07/09/2022), Disp: 100 g, Rfl: 2   losartan (COZAAR) 100 MG tablet, Take 1 tablet (100 mg total) by mouth daily., Disp: 90 tablet, Rfl: 1   methocarbamol (ROBAXIN) 500 MG tablet, Take 1 tablet (500 mg total) by mouth 2 (two) times daily as needed for muscle spasms., Disp: 30 tablet, Rfl: 0   naproxen (NAPROSYN) 500 MG tablet, Take 1 tablet (500 mg total) by mouth 2 (two) times daily with a meal. FOR LOW BACK PAIN, Disp: 60 tablet, Rfl: 0   oxymetazoline (AFRIN NASAL SPRAY) 0.05 % nasal spray, 1 spray each nostril daily x 2 days  then PRN.  Not to be use on a continuous daily bases, Disp: 30 mL, Rfl: 0   sildenafil (VIAGRA) 100 MG tablet, Take 0.5-1 tablets (50-100 mg total) by mouth daily as needed for erectile dysfunction. (Patient not taking: Reported on 07/09/2022), Disp: 10 tablet, Rfl: 11   spironolactone (ALDACTONE) 50 MG tablet, Take 1 tablet (50 mg total) by mouth daily., Disp: 90 tablet, Rfl: 1   Vitamin D, Ergocalciferol, (DRISDOL) 1.25 MG (50000 UNIT) CAPS capsule, Take 1 capsule (50,000 Units total) by mouth every 7 (seven) days., Disp: 12 capsule, Rfl: 1   Medications ordered in this encounter:  Meds ordered this encounter  Medications   methocarbamol (ROBAXIN) 500 MG tablet    Sig: Take 1 tablet (500 mg total) by mouth 2 (two) times daily as needed for muscle spasms.    Dispense:  30 tablet    Refill:  0    Order Specific Question:   Supervising Provider    Answer:   Merrilee Jansky [[id number]]   naproxen (NAPROSYN) 500 MG tablet    Sig: Take 1 tablet (500 mg total) by mouth 2 (two) times daily with a meal. FOR LOW BACK PAIN    Dispense:  60 tablet    Refill:  0    Order Specific Question:   Supervising Provider    Answer:   Merrilee Jansky [id number]     *If you need refills on other medications prior to your  next appointment, please contact your pharmacy*  Follow-Up: Call back or seek an in-person evaluation if the symptoms worsen or if the condition fails to improve as anticipated.  [facility] [phone]  Other Instructions Work on losing weight to help reduce back pain. May alternate with heat and ice application for pain relief. May also alternate with acetaminophen and Ibuprofen as prescribed for back pain. Other alternatives include massage, acupuncture and water aerobics.    If you have been instructed to have an in-person evaluation today at a local Urgent Care facility, please use the link below. It will take you to a list of all of our available [facility], including address, phone number and hours of operation. Please do not delay care.  [facility]  If you or a family member do not have a primary care provider, use the link below to schedule a visit and establish care. When you choose a [facility] primary care physician or advanced practice provider, you gain a long-term partner in health. Find a Primary Care Provider  Learn more about [facility] in-office and virtual care options: [facility] - Get Care Now

## 2022-07-16 ENCOUNTER — Other Ambulatory Visit: Payer: Self-pay

## 2022-07-17 ENCOUNTER — Telehealth (HOSPITAL_COMMUNITY): Payer: Self-pay | Admitting: *Deleted

## 2022-07-17 NOTE — Telephone Encounter (Signed)
Patient given instructions for upcoming ETT. Shaeleigh Graw Jacqueline  

## 2022-07-23 ENCOUNTER — Other Ambulatory Visit: Payer: Self-pay

## 2022-07-25 ENCOUNTER — Ambulatory Visit (HOSPITAL_COMMUNITY): Payer: Medicaid Other

## 2022-07-26 ENCOUNTER — Telehealth: Payer: Self-pay | Admitting: Internal Medicine

## 2022-07-26 ENCOUNTER — Other Ambulatory Visit (HOSPITAL_COMMUNITY): Payer: Self-pay

## 2022-07-26 ENCOUNTER — Ambulatory Visit: Payer: 59 | Admitting: Physician Assistant

## 2022-07-26 ENCOUNTER — Encounter: Payer: Self-pay | Admitting: Physician Assistant

## 2022-07-26 ENCOUNTER — Ambulatory Visit: Payer: Self-pay

## 2022-07-26 ENCOUNTER — Other Ambulatory Visit: Payer: Self-pay

## 2022-07-26 VITALS — BP 158/103 | HR 74 | Ht 75.0 in | Wt 203.0 lb

## 2022-07-26 DIAGNOSIS — J302 Other seasonal allergic rhinitis: Secondary | ICD-10-CM

## 2022-07-26 DIAGNOSIS — H6691 Otitis media, unspecified, right ear: Secondary | ICD-10-CM

## 2022-07-26 DIAGNOSIS — F17210 Nicotine dependence, cigarettes, uncomplicated: Secondary | ICD-10-CM

## 2022-07-26 MED ORDER — DOXYCYCLINE HYCLATE 100 MG PO CAPS
100.0000 mg | ORAL_CAPSULE | Freq: Two times a day (BID) | ORAL | 0 refills | Status: AC
  Filled 2022-07-26: qty 14, 7d supply, fill #0

## 2022-07-26 MED ORDER — FLUTICASONE PROPIONATE 50 MCG/ACT NA SUSP
2.0000 | Freq: Every day | NASAL | 6 refills | Status: DC
  Filled 2022-07-26: qty 16, 30d supply, fill #0

## 2022-07-26 NOTE — Telephone Encounter (Signed)
Message from Arther Dames sent at 07/26/2022  1:29 PM EDT  Summary: Right ear pain   Patient states he woke up with right ear pain yesterday morning. No appointments available. Please advise.            Chief Complaint: right earache  Symptoms: earache 8/10, popping sensation, nasal congestion to the right side, mild headache, runny nose Frequency: this am  Pertinent Negatives: Patient denies fever, discharge, stiff neck  Disposition: [] ED /[] Urgent Care (no appt availability in office) / [] Appointment(In office/virtual)/ []  [facility]/ [] Home Care/ [] Refused Recommended Disposition /[x] [facility]/ []  Follow-up with PCP Additional Notes: Pt is on Clindamycin for another issue, also mentioned Dr Delford Field visualized a "mass" to the posterior nasopharynx. Pt would like this removed and a ENT referral. Advised saline nasal washing.  Reason for Disposition  Earache  (Exceptions: brief ear pain of < 60 minutes duration, earache occurring during air travel  Answer Assessment - Initial Assessment Questions 1. LOCATION: "Which ear is involved?"     Right  2. ONSET: "When did the ear start hurting"      Yesterday  3. SEVERITY: "How bad is the pain?"  (Scale 1-10; mild, moderate or severe)   - MILD (1-3): doesn't interfere with normal activities    - MODERATE (4-7): interferes with normal activities or awakens from sleep    - SEVERE (8-10): excruciating pain, unable to do any normal activities      8/1O 4. URI SYMPTOMS: "Do you have a runny nose or cough?"     Runny nose 5. FEVER: "Do you have a fever?" If Yes, ask: "What is your temperature, how was it measured, and when did it start?"     no 6. CAUSE: "Have you been swimming recently?", "How often do you use Q-TIPS?", "Have you had any recent air travel or scuba diving?"     N/a 7. OTHER SYMPTOMS: "Do you have any other symptoms?" (e.g., headache, stiff neck, dizziness, vomiting, runny nose, decreased hearing)      [place] hears popping, runny nose, headache 8. PREGNANCY: "Is there any chance you are pregnant?" "When was your last menstrual period?"     N/a  Protocols used: Davina Poke

## 2022-07-26 NOTE — Telephone Encounter (Signed)
Patient Instructions for Stress Test  Medication instructions:   Do NOT take your __________carvedilol______________________  Do not eat, drink or use tobacco products four hours prior to the test.  Water is ok.  3.  Dress prepared to exercise in a comfortable, two piece clothing outfit and walking shoes.  4.  Bring any current prescription medications with you the day of the test.  5.  Notify the office 24 hours in advance if you cannot keep this appointment.  6.  If you have any questions, please call [phone].   Pt called back and reports that noone told him not to take his beta blocker before the ETT, so he had to reschedule it last time.  I apologized for that. He knows to hold the carvedilol before ETT.

## 2022-07-26 NOTE — Telephone Encounter (Signed)
Returned pt's call- asked what it is in regards to. He said he thinks that a nurse  was telling him what he needed to do in preperation for his upcoming ETT?    Then he said that he is at an appointment and asked me to call him back in about 30 minutes.

## 2022-07-26 NOTE — Telephone Encounter (Signed)
Seen on the Mobile bus today

## 2022-07-26 NOTE — Patient Instructions (Signed)
You are going to doxycycline twice a day for 7 days.  Because you are also currently finishing your prescription of clindamycin, I encourage you to protect your stomach, I encourage you to eat yogurt on a daily basis.  I also sent a prescription of Flonase to your pharmacy, if this does not seem to offer relief I encourage you to return to the mobile unit for follow-up with your primary care provider for further evaluation.  I hope that you feel better soon, please let us know if there is anything else we can do for you  Roney Jaffe, PA-C Physician Assistant [facility] [email or web address]   Otitis Media, Adult  Otitis media occurs when there is inflammation and fluid in the middle ear with signs and symptoms of an acute infection. The middle ear is a part of the ear that contains bones for hearing as well as air that helps send sounds to the brain. When infected fluid builds up in this space, it causes pressure and can lead to an ear infection. The eustachian tube connects the middle ear to the back of the nose (nasopharynx) and normally allows air into the middle ear. If the eustachian tube becomes blocked, fluid can build up and become infected. What are the causes? This condition is caused by a blockage in the eustachian tube. This can be caused by mucus or by swelling of the tube. Problems that can cause a blockage include: A cold or other upper respiratory infection. Allergies. An irritant, such as tobacco smoke. Enlarged adenoids. The adenoids are areas of soft tissue located high in the back of the throat, behind the nose and the roof of the mouth. They are part of the body's defense system (immune system). A mass in the nasopharynx. Damage to the ear caused by pressure changes (barotrauma). What increases the risk? You are more likely to develop this condition if you: Smoke or are exposed to tobacco smoke. Have an opening in  the roof of your mouth (cleft palate). Have gastroesophageal reflux. Have an immune system disorder. What are the signs or symptoms? Symptoms of this condition include: Ear pain. Fever. Decreased hearing. Tiredness (lethargy). Fluid leaking from the ear, if the eardrum is ruptured or has burst. Ringing in the ear. How is this diagnosed?  This condition is diagnosed with a physical exam. During the exam, your health care provider will use an instrument called an otoscope to look in your ear and check for redness, swelling, and fluid. He or she will also ask about your symptoms. Your health care provider may also order tests, such as: A pneumatic otoscopy. This is a test to check the movement of the eardrum. It is done by squeezing a small amount of air into the ear. A tympanogram. This is a test that shows how well the eardrum moves in response to air pressure in the ear canal. It provides a graph for your health care provider to review. How is this treated? This condition can go away on its own within 3-5 days. But if the condition is caused by a bacterial infection and does not go away on its own, or if it keeps coming back, your health care provider may: Prescribe antibiotic medicine to treat the infection. Prescribe or recommend medicines to control pain. Follow these instructions at home: Take over-the-counter and prescription medicines only as told by your health care provider. If you were prescribed an antibiotic medicine, take it as told by your health care provider.  Do not stop taking the antibiotic even if you start to feel better. Keep all follow-up visits. This is important. Contact a health care provider if: You have bleeding from your nose. There is a lump on your neck. You are not feeling better in 5 days. You feel worse instead of better. Get help right away if: You have severe pain that is not controlled with medicine. You have swelling, redness, or pain around your  ear. You have stiffness in your neck. A part of your face is not moving (paralyzed). The bone behind your ear (mastoid bone) is tender when you touch it. You develop a severe headache. Summary Otitis media is redness, soreness, and swelling of the middle ear, usually resulting in pain and decreased hearing. This condition can go away on its own within 3-5 days. If the problem does not go away in 3-5 days, your health care provider may give you medicines to treat the infection. If you were prescribed an antibiotic medicine, take it as told by your health care provider. Follow all instructions that were given to you by your health care provider. This information is not intended to replace advice given to you by your health care provider. Make sure you discuss any questions you have with your health care provider. Document Revised: 06/06/2020 Document Reviewed: 06/06/2020 Elsevier Patient Education  2023 [employer].

## 2022-07-26 NOTE — Progress Notes (Signed)
Established Patient Office Visit  Subjective   Patient ID: [name], male    DOB: [birth year]  Age: 47 y.o. MRN: [id number]  Chief Complaint  Patient presents with   Ear Pain    Right ear pain started yesterday, with headache. Denies fever    States that he started having pain and a popping sensation in his right ear yesterday.  States the pain has been causing a headache pressure in his right side sinuses.  States that he has also been experiencing some nasal congestion, describes it as a small amount and clear.  States that he does have a significant history of a nasal obstruction on the right side.  States that he will use saline spray with mild relief.  Denies sick contacts, states that he did try Tylenol with mild relief as well.    States that he is currently finishing a regimen of clindamycin, for a dental abscess, has 6 pills or 2 days worth left.         Past Medical History:  Diagnosis Date   Hypertension 2003   dx age 80    Wears glasses    Social History   Socioeconomic History   Marital status: Single    Spouse name: Not on file   Number of children: Not on file   Years of education: Not on file   Highest education level: Not on file  Occupational History   Not on file  Tobacco Use   Smoking status: Every Day    Packs/day: 1    Types: Cigars, Cigarettes    Last attempt to quit: 05/10/2015    Years since quitting: 7.2   Smokeless tobacco: Never  Vaping Use   Vaping Use: Never used  Substance and Sexual Activity   Alcohol use: Yes    Comment: occ   Drug use: Yes    Types: Marijuana   Sexual activity: Not on file  Other Topics Concern   Not on file  Social History Narrative   Not on file   Social Determinants of Health   Financial Resource Strain: Low Risk  (05/31/2022)   Overall Financial Resource Strain (CARDIA)    Difficulty of Paying Living Expenses: Not very hard  Food Insecurity: No Food Insecurity (05/31/2022)   Hunger Vital Sign     Worried About Running Out of Food in the Last Year: Never true    Ran Out of Food in the Last Year: Never true  Transportation Needs: No Transportation Needs (05/31/2022)   PRAPARE - Administrator, Civil Service (Medical): No    Lack of Transportation (Non-Medical): No  Physical Activity: Inactive (05/31/2022)   Exercise Vital Sign    Days of Exercise per Week: 0 days    Minutes of Exercise per Session: 0 min  Stress: No Stress Concern Present (05/31/2022)   [employer] of Occupational Health - Occupational Stress Questionnaire    Feeling of Stress : Only a little  Social Connections: Moderately Isolated (05/31/2022)   Social Connection and Isolation Panel [NHANES]    Frequency of Communication with Friends and Family: Twice a week    Frequency of Social Gatherings with Friends and Family: Twice a week    Attends Religious Services: Never    Database administrator or Organizations: No    Attends Banker Meetings: Never    Marital Status: Living with partner  Intimate Partner Violence: Not At Risk (05/31/2022)   Humiliation, Afraid, Rape, and Kick  questionnaire    Fear of Current or Ex-Partner: No    Emotionally Abused: No    Physically Abused: No    Sexually Abused: No   Family History  Problem Relation Age of Onset   Cancer Mother      Review of Systems  Constitutional:  Negative for chills and fever.  HENT:  Positive for congestion, ear pain and sinus pain. Negative for ear discharge, sore throat and tinnitus.   Eyes: Negative.   Respiratory:  Negative for cough, sputum production and shortness of breath.   Cardiovascular:  Negative for chest pain.  Gastrointestinal:  Negative for abdominal pain, nausea and vomiting.  Genitourinary: Negative.   Musculoskeletal:  Negative for myalgias.  Skin: Negative.   Neurological:  Positive for headaches.  Endo/Heme/Allergies: Negative.   Psychiatric/Behavioral: Negative.        Objective:     BP  (!) 158/103 (BP Location: Left Arm, Patient Position: Sitting, Cuff Size: Large)   Pulse 74   Ht 6\' 3"  (1.905 m)   Wt 203 lb (92.1 kg)   SpO2 98%   BMI 25.37 kg/m  BP Readings from Last 3 Encounters:  07/26/22 (!) 158/103  07/09/22 128/68  07/02/22 (!) 146/99   Wt Readings from Last 3 Encounters:  07/26/22 203 lb (92.1 kg)  07/09/22 208 lb 6.4 oz (94.5 kg)  07/02/22 205 lb (93 kg)      Physical Exam Vitals and nursing note reviewed.  Constitutional:      Appearance: Normal appearance.  HENT:     Head: Normocephalic.     Jaw: There is normal jaw occlusion.     Salivary Glands: Right salivary gland is not diffusely enlarged or tender. Left salivary gland is not diffusely enlarged or tender.     Right Ear: Tympanic membrane is erythematous and bulging.     Left Ear: Tympanic membrane, ear canal and external ear normal.     Nose:     Right Turbinates: Enlarged and swollen.     Left Turbinates: Enlarged and swollen.     Right Sinus: Maxillary sinus tenderness and frontal sinus tenderness present.     Left Sinus: No maxillary sinus tenderness or frontal sinus tenderness.     Mouth/Throat:     Lips: Pink.     Mouth: Mucous membranes are moist.     Pharynx: Oropharynx is clear.  Eyes:     Extraocular Movements: Extraocular movements intact.     Conjunctiva/sclera: Conjunctivae normal.     Pupils: Pupils are equal, round, and reactive to light.  Cardiovascular:     Rate and Rhythm: Normal rate and regular rhythm.     Pulses: Normal pulses.     Heart sounds: Normal heart sounds.  Pulmonary:     Effort: Pulmonary effort is normal.     Breath sounds: Normal breath sounds.  Musculoskeletal:        General: Normal range of motion.     Cervical back: Normal range of motion and neck supple.  Skin:    General: Skin is warm and dry.  Neurological:     General: No focal deficit present.     Mental Status: He is alert and oriented to person, place, and time.  Psychiatric:         Mood and Affect: Mood normal.        Behavior: Behavior normal.        Thought Content: Thought content normal.        Judgment: Judgment normal.  Assessment & Plan:   Problem List Items Addressed This Visit   None Visit Diagnoses     Right otitis media, unspecified otitis media type    -  Primary   Relevant Medications   doxycycline (VIBRAMYCIN) 100 MG capsule   Seasonal allergies       Relevant Medications   fluticasone (FLONASE) 50 MCG/ACT nasal spray     1. Right otitis media, unspecified otitis media type Penicillin allergy, severe.  Trial doxycycline.  Patient education given on supportive care, red flags given for prompt reevaluation - doxycycline (VIBRAMYCIN) 100 MG capsule; Take 1 capsule (100 mg total) by mouth 2 (two) times daily for 7 days.  Dispense: 14 capsule; Refill: 0  2. Seasonal allergies Trial Flonase - fluticasone (FLONASE) 50 MCG/ACT nasal spray; Place 2 sprays into both nostrils daily.  Dispense: 16 g; Refill: 6   I have reviewed the patient's medical history (PMH, PSH, Social History, Family History, Medications, and allergies) , and have been updated if relevant. I spent 30 minutes reviewing chart and  face to face time with patient.   Return if symptoms worsen or fail to improve.    Kasandra Knudsen Mayers, PA-C

## 2022-07-26 NOTE — Telephone Encounter (Signed)
Patient is returning call.  °

## 2022-07-27 ENCOUNTER — Encounter (HOSPITAL_COMMUNITY): Payer: Self-pay | Admitting: *Deleted

## 2022-07-29 ENCOUNTER — Other Ambulatory Visit: Payer: Self-pay

## 2022-07-29 ENCOUNTER — Emergency Department (HOSPITAL_COMMUNITY)
Admission: EM | Admit: 2022-07-29 | Discharge: 2022-07-29 | Disposition: A | Discharge status: Home / Self Care | Payer: Medicaid Other | Attending: Emergency Medicine | Admitting: Emergency Medicine

## 2022-07-29 DIAGNOSIS — I10 Essential (primary) hypertension: Secondary | ICD-10-CM | POA: Insufficient documentation

## 2022-07-29 DIAGNOSIS — G510 Bell's palsy: Secondary | ICD-10-CM | POA: Insufficient documentation

## 2022-07-29 DIAGNOSIS — R29810 Facial weakness: Secondary | ICD-10-CM | POA: Diagnosis present

## 2022-07-29 MED ORDER — VALACYCLOVIR HCL 500 MG PO TABS
1000.0000 mg | ORAL_TABLET | Freq: Once | ORAL | Status: AC
  Administered 2022-07-29: 1000 mg via ORAL
  Filled 2022-07-29: qty 2

## 2022-07-29 MED ORDER — CLINDAMYCIN HCL 300 MG PO CAPS
300.0000 mg | ORAL_CAPSULE | Freq: Once | ORAL | Status: AC
  Administered 2022-07-29: 300 mg via ORAL
  Filled 2022-07-29: qty 1

## 2022-07-29 MED ORDER — PREDNISONE 20 MG PO TABS
60.0000 mg | ORAL_TABLET | Freq: Once | ORAL | Status: AC
  Administered 2022-07-29: 60 mg via ORAL
  Filled 2022-07-29: qty 3

## 2022-07-29 MED ORDER — PREDNISONE 20 MG PO TABS
60.0000 mg | ORAL_TABLET | Freq: Every day | ORAL | 0 refills | Status: AC
  Filled 2022-07-29: qty 27, 9d supply, fill #0

## 2022-07-29 MED ORDER — VALACYCLOVIR HCL 1 G PO TABS
1000.0000 mg | ORAL_TABLET | Freq: Three times a day (TID) | ORAL | 0 refills | Status: AC
  Filled 2022-07-29: qty 21, 7d supply, fill #0

## 2022-07-29 MED ORDER — CLINDAMYCIN HCL 300 MG PO CAPS
300.0000 mg | ORAL_CAPSULE | Freq: Three times a day (TID) | ORAL | 0 refills | Status: DC
  Filled 2022-07-29: qty 30, 10d supply, fill #0

## 2022-07-29 NOTE — ED Triage Notes (Signed)
Pt arrived via POV. C/o facial swelling and droop that they noticed at 0800 yesterday. Pt recently started doxycycline for an ear infection  Pt AOx4

## 2022-07-29 NOTE — Discharge Instructions (Addendum)
As discussed, symptoms most likely secondary to Bell's palsy.  This is secondary to most likely a viral infection and not associated with stroke.  Will treat with prednisone as well as antiviral medicine.  Will put you back on clindamycin for presumed sinus infection.  Recommend follow-up with neurologist outpatient for reevaluation; call number attached to set up an appointment.  Please do not hesitate to return to emergency department for worrisome signs and symptoms we discussed become apparent.

## 2022-07-29 NOTE — ED Provider Notes (Signed)
[place] EMERGENCY DEPARTMENT AT [facility] Provider Note   CSN: [id number] Arrival date & time: 07/29/22  1652     History  Chief Complaint  Patient presents with   Facial Droop    [name] is a 47 y.o. male.  HPI   47 year old male presents emergency department with complaints of right-sided facial droop.  Patient states that he noticed symptoms upon awakening yesterday morning.  Reports being treated for right ear infection on 3 days of doxycycline but states the pain is still present.  Denies visual disturbance, gait abnormality, weakness/sensory deficits in upper or lower extremities, slurred speech.  Denies history of similar symptoms in the past.  Past medical history significant for hypertension  Home Medications Prior to Admission medications   Medication Sig Start Date End Date Taking? Authorizing Provider  clindamycin (CLEOCIN) 300 MG capsule Take 1 capsule (300 mg total) by mouth 3 (three) times daily for 10 days. 07/29/22 08/08/22 Yes Sherian Maroon A, PA  predniSONE (DELTASONE) 20 MG tablet Take 3 tablets (60 mg total) by mouth daily with breakfast for 9 days. 07/30/22 08/08/22 Yes Sherian Maroon A, PA  valACYclovir (VALTREX) 1000 MG tablet Take 1 tablet (1,000 mg total) by mouth 3 (three) times daily for 7 days. 07/30/22 08/06/22 Yes Sherian Maroon A, PA  amLODipine (NORVASC) 10 MG tablet Take 1 tablet (10 mg total) by mouth daily. 04/27/22   [name], MD  carvedilol (COREG) 25 MG tablet Take 1 tablet (25 mg total) by mouth 2 (two) times daily with a meal. 04/27/22   [name], MD  cholecalciferol (VITAMIN D3) 25 MCG (1000 UNIT) tablet Take 1 tablet (1,000 Units total) by mouth daily. Patient not taking: Reported on 07/09/2022 04/28/22   [name], MD  diclofenac Sodium (VOLTAREN) 1 % GEL Apply 2 g topically 4 (four) times daily. Patient not taking: Reported on 07/09/2022 08/14/21   Storm Frisk, MD  doxycycline  (VIBRAMYCIN) 100 MG capsule Take 1 capsule (100 mg total) by mouth 2 (two) times daily for 7 days. 07/26/22 08/02/22  Mayers, Cari S, PA-C  fluticasone (FLONASE) 50 MCG/ACT nasal spray Place 2 sprays into both nostrils daily. 07/26/22   Mayers, Cari S, PA-C  losartan (COZAAR) 100 MG tablet Take 1 tablet (100 mg total) by mouth daily. 04/27/22   [name], MD  methocarbamol (ROBAXIN) 500 MG tablet Take 1 tablet (500 mg total) by mouth 2 (two) times daily as needed for muscle spasms. 07/15/22   Claiborne Rigg, NP  naproxen (NAPROSYN) 500 MG tablet Take 1 tablet (500 mg total) by mouth 2 (two) times daily with a meal. FOR LOW BACK PAIN Patient not taking: Reported on 07/26/2022 07/15/22   Claiborne Rigg, NP  sildenafil (VIAGRA) 100 MG tablet Take 0.5-1 tablets (50-100 mg total) by mouth daily as needed for erectile dysfunction. Patient not taking: Reported on 07/09/2022 04/20/22   Claiborne Rigg, NP  spironolactone (ALDACTONE) 50 MG tablet Take 1 tablet (50 mg total) by mouth daily. 04/27/22   [name], MD  Vitamin D, Ergocalciferol, (DRISDOL) 1.25 MG (50000 UNIT) CAPS capsule Take 1 capsule (50,000 Units total) by mouth every 7 (seven) days. 06/01/22   [name], MD      Allergies    Penicillins    Review of Systems   Review of Systems  All other systems reviewed and are negative.   Physical Exam Updated Vital Signs BP (!) 154/96 (BP  Location: Left Arm)   Pulse 75   Temp 97.6 F (36.4 C) (Oral)   Resp 18   Ht 6\' 3"  (1.905 m)   Wt 95.3 kg   SpO2 98%   BMI 26.25 kg/m  Physical Exam Vitals and nursing note reviewed.  Constitutional:      General: He is not in acute distress.    Appearance: He is well-developed.  HENT:     Head: Normocephalic and atraumatic.     Ears:     Comments: Right TM with clear serous fluid.  No obvious erythema or drainage externally. Eyes:     Conjunctiva/sclera: Conjunctivae normal.  Cardiovascular:     Rate and Rhythm: Normal rate  and regular rhythm.     Heart sounds: No murmur heard. Pulmonary:     Effort: Pulmonary effort is normal. No respiratory distress.     Breath sounds: Normal breath sounds. No wheezing, rhonchi or rales.  Abdominal:     Palpations: Abdomen is soft.     Tenderness: There is no abdominal tenderness.  Musculoskeletal:        General: No swelling.     Cervical back: Neck supple.  Skin:    General: Skin is warm and dry.     Capillary Refill: Capillary refill takes less than 2 seconds.  Neurological:     Mental Status: He is alert.     Comments: Alert and oriented to self, place, time and event.   Speech is fluent, clear without dysarthria or dysphasia.   Strength 5/5 in upper/lower extremities   Sensation intact in upper/lower extremities   Normal gait.  Negative Romberg. No pronator drift.  Normal finger-to-nose and feet tapping.  CN I not tested  CN II grossly intact visual fields bilaterally. Did not visualize posterior eye.  CN III, IV, VI PERRLA and EOMs intact bilaterally  CN V Intact sensation to sharp and light touch to the face  CN VII patient with right-sided facial droop involving forehead. CN VIII not tested  CN IX, X no uvula deviation, symmetric rise of soft palate  CN XI 5/5 SCM and trapezius strength bilaterally  CN XII Midline tongue protrusion, symmetric L/R movements   Psychiatric:        Mood and Affect: Mood normal.     ED Results / Procedures / Treatments   Labs (all labs ordered are listed, but only abnormal results are displayed) Labs Reviewed - No data to display  EKG None  Radiology No results found.  Procedures Procedures    Medications Ordered in ED Medications  predniSONE (DELTASONE) tablet 60 mg (60 mg Oral Given 07/29/22 1741)  valACYclovir (VALTREX) tablet 1,000 mg (1,000 mg Oral Given 07/29/22 1741)  clindamycin (CLEOCIN) capsule 300 mg (300 mg Oral Given 07/29/22 1741)    ED Course/ Medical Decision Making/ A&P Clinical Course as  of 07/29/22 1855  Sun Jul 29, 2022  1706 Temp: 97.6 F (36.4 C) [CR]  1720 I evaluated primarily at bedside.  Right-sided facial droop [CC]    Clinical Course User Index [CC] Glyn Ade, MD [CR] Peter Garter, [place]                             Medical Decision Making Risk Prescription drug management.   This patient presents to the ED for concern of facial droop, this involves an extensive number of treatment options, and is a complaint that carries with it a high risk  of complications and morbidity.  The differential diagnosis includes CVA, cerebral venous thrombosis, Bell's palsy, Ramsay Hunt syndrome   Co morbidities that complicate the patient evaluation  See HPI   Additional history obtained:  Additional history obtained from EMR External records from outside source obtained and reviewed including hospital records   Lab Tests:   N/a   Imaging Studies ordered:  N/a   Cardiac Monitoring: / EKG:  The patient was maintained on a cardiac monitor.  I personally viewed and interpreted the cardiac monitored which showed an underlying rhythm of: Sinus rhythm   Consultations Obtained:  I requested consultation with attending physician Dr. Doran Durand who independently evaluated the patient and was in agreement with treatment plan going forward.    Problem List / ED Course / Critical interventions / Medication management  Bell's Palsy I ordered medication including prednisone, Valtrex, clindamycin   Reevaluation of the patient after these medicines showed that the patient improved I have reviewed the patients home medicines and have made adjustments as needed   Social Determinants of Health:  Chronic tobacco use.  Denies illicit drug use.   Test / Admission - Considered:  Bell's Palsy Vitals signs significant for mild hypertension with blood pressure 154/96. Otherwise within normal range and stable throughout visit. 47 year old male presents  emergency department with complaints of right-sided facial droop. Neurologic exam entirely benign besides right-sided cranial nerve VII palsy without sparing of the forehead.  Symptoms most consistent with right-sided Bell's palsy. Imaging/laboratory studies deemed unnecessary.  Will place patient on corticosteroid therapy as well as valacyclovir and recommend follow-up closely with neurology outpatient.  Patient also with 10+ days of maxillary sinus pressure and nasal drainage; will treat empirically for bacterial sinusitis.  Treatment plan discussed at length with patient and he acknowledged understanding was agreeable to said plan. Worrisome signs and symptoms were discussed with the patient, and the patient acknowledged understanding to return to the ED if noticed. Patient was stable upon discharge.          Final Clinical Impression(s) / ED Diagnoses Final diagnoses:  Bell's palsy    Rx / DC Orders ED Discharge Orders          Ordered    predniSONE (DELTASONE) 20 MG tablet  Daily with breakfast        07/29/22 1733    valACYclovir (VALTREX) 1000 MG tablet  3 times daily        07/29/22 1733    clindamycin (CLEOCIN) 300 MG capsule  3 times daily        07/29/22 1733              Peter Garter, [place] 07/29/22 Ma Hillock, MD 07/29/22 2246

## 2022-07-30 ENCOUNTER — Other Ambulatory Visit: Payer: Self-pay

## 2022-07-30 ENCOUNTER — Ambulatory Visit (HOSPITAL_COMMUNITY): Payer: Medicaid Other | Attending: Internal Medicine

## 2022-07-30 ENCOUNTER — Other Ambulatory Visit (HOSPITAL_COMMUNITY): Payer: Self-pay | Admitting: Internal Medicine

## 2022-07-30 DIAGNOSIS — R079 Chest pain, unspecified: Secondary | ICD-10-CM

## 2022-07-30 LAB — EXERCISE TOLERANCE TEST
Angina Index: 0
Duke Treadmill Score: 8
Estimated workload: 10.1
Exercise duration (min): 8 min
Exercise duration (sec): 16 s
MPHR: 172 {beats}/min
Peak HR: 148 {beats}/min
Percent HR: 86 %
Rest HR: 93 {beats}/min
ST Depression (mm): 0 mm

## 2022-08-01 ENCOUNTER — Encounter: Payer: Self-pay | Admitting: *Deleted

## 2022-08-01 ENCOUNTER — Other Ambulatory Visit: Payer: Self-pay | Admitting: *Deleted

## 2022-08-01 ENCOUNTER — Other Ambulatory Visit: Payer: Self-pay

## 2022-08-01 DIAGNOSIS — R079 Chest pain, unspecified: Secondary | ICD-10-CM

## 2022-08-01 MED ORDER — METOPROLOL TARTRATE 50 MG PO TABS
ORAL_TABLET | ORAL | 0 refills | Status: DC
  Filled 2022-08-01 – 2022-08-27 (×2): qty 1, 1d supply, fill #0

## 2022-08-02 ENCOUNTER — Ambulatory Visit: Payer: Self-pay

## 2022-08-02 ENCOUNTER — Telehealth: Payer: Self-pay | Admitting: Internal Medicine

## 2022-08-02 NOTE — Telephone Encounter (Signed)
Pt is calling in because he was told to call back if he didn't hear anything from the office. Pt says he spoke to someone earlier who was trying to see if he could be seen today.

## 2022-08-02 NOTE — Telephone Encounter (Signed)
  Chief Complaint: Ear pain- Bell's palsy Symptoms: above Frequency: 5/16 Pertinent Negatives: Patient denies  Disposition: [] ED /[] Urgent Care (no appt availability in office) / [] Appointment(In office/virtual)/ []  [place] [facility]/ [] Home Care/ [] Refused Recommended Disposition /[] [place] [facility]/ [x]  Follow-up with PCP Additional Notes: Pt was seen at mobile unit. There he was given medications for his ear. He was then seen at ED for Bell's Palsy. Ear pain remains. Pt will not go back to mobile unit and does not want to go to ED. Pt wants to be "worked in" today. Pt would like a call back asap. Please advise.    Summary: ear pain   Pt went tp [facility] last for ear pain they gave him medication that caused him to have Bells Palsy.  He was seen in the ER at [facility] for that but he is still having the ear pain.  CB#  [phone]

## 2022-08-03 NOTE — Telephone Encounter (Signed)
Pt is calling in returning a call from Cassandra.

## 2022-08-03 NOTE — Telephone Encounter (Signed)
Spoke with patient . Verified name & DOB   Patient voiced that that he would prefer to see [facility] or [facility]. Appointment with [facility] ob Tuesday. Other appointment with [facility] counseled

## 2022-08-03 NOTE — Telephone Encounter (Signed)
Call placed to patient unable to reach unable to leave message voice mail not set-up.

## 2022-08-07 ENCOUNTER — Ambulatory Visit: Payer: Medicaid Other | Admitting: Critical Care Medicine

## 2022-08-07 ENCOUNTER — Ambulatory Visit: Payer: Medicaid Other | Attending: Family Medicine | Admitting: Family Medicine

## 2022-08-07 ENCOUNTER — Other Ambulatory Visit: Payer: Self-pay

## 2022-08-07 VITALS — BP 125/77 | HR 74 | Ht 74.0 in | Wt 210.4 lb

## 2022-08-07 DIAGNOSIS — G510 Bell's palsy: Secondary | ICD-10-CM | POA: Diagnosis not present

## 2022-08-07 DIAGNOSIS — H6123 Impacted cerumen, bilateral: Secondary | ICD-10-CM

## 2022-08-07 MED ORDER — HYPROMELLOSE (GONIOSCOPIC) 2.5 % OP SOLN
1.0000 [drp] | Freq: Four times a day (QID) | OPHTHALMIC | 1 refills | Status: DC | PRN
Start: 2022-08-07 — End: 2022-09-24
  Filled 2022-08-07: qty 15, 75d supply, fill #0

## 2022-08-07 MED ORDER — DULOXETINE HCL 60 MG PO CPEP
60.0000 mg | ORAL_CAPSULE | Freq: Every day | ORAL | 3 refills | Status: DC
Start: 2022-08-07 — End: 2022-09-24
  Filled 2022-08-07: qty 30, 30d supply, fill #0

## 2022-08-07 NOTE — Progress Notes (Unsigned)
Pain in right side of face

## 2022-08-07 NOTE — Progress Notes (Unsigned)
Subjective:  Patient ID: [name], male    DOB: 17-Nov-1975  Age: 47 y.o. MRN: [id number]  CC: Ear Pain   HPI [name] is a 47 y.o. year old male patient of Dr Laural Benes with a history of HTN  Interval History:  Seen at the ED on 07/29/22 for Bell's palsy and sinusitis.  He had been seen at mobile unit for sinusitis today prior to ED presentation and was present doxycycline but noticed drooping of the right side of his face while driving to work.  He received Prescription for Valtrex, Prednisone, Clindamycin at the ED. He was advised to follow up with Neurology but no referral was placed.  He has pain on the right side of his face 7/10 described as discomfort, he has right otalgia and he has difficulty eating and drinking. He is yet to complete his dose of Prednisone and Clindamycin but has completed course of Valtrex.  He has difficulty completely shutting his right eye.  Past Medical History:  Diagnosis Date   Hypertension 2003   dx age 79    Wears glasses     Past Surgical History:  Procedure Laterality Date   CHOLECYSTECTOMY      Family History  Problem Relation Age of Onset   Cancer Mother     Social History   Socioeconomic History   Marital status: Single    Spouse name: Not on file   Number of children: Not on file   Years of education: Not on file   Highest education level: Not on file  Occupational History   Not on file  Tobacco Use   Smoking status: Every Day    Packs/day: 1    Types: Cigars, Cigarettes    Last attempt to quit: 05/10/2015    Years since quitting: 7.2   Smokeless tobacco: Never  Vaping Use   Vaping Use: Never used  Substance and Sexual Activity   Alcohol use: Yes    Comment: occ   Drug use: Yes    Types: Marijuana   Sexual activity: Not on file  Other Topics Concern   Not on file  Social History Narrative   Not on file   Social Determinants of Health   Financial Resource Strain: Low Risk  (05/31/2022)   Overall  Financial Resource Strain (CARDIA)    Difficulty of Paying Living Expenses: Not very hard  Food Insecurity: No Food Insecurity (05/31/2022)   Hunger Vital Sign    Worried About Running Out of Food in the Last Year: Never true    Ran Out of Food in the Last Year: Never true  Transportation Needs: No Transportation Needs (05/31/2022)   PRAPARE - Administrator, Civil Service (Medical): No    Lack of Transportation (Non-Medical): No  Physical Activity: Unknown (08/07/2022)   Exercise Vital Sign    Days of Exercise per Week: 5 days    Minutes of Exercise per Session: Patient declined  Recent Concern: Physical Activity - Inactive (05/31/2022)   Exercise Vital Sign    Days of Exercise per Week: 0 days    Minutes of Exercise per Session: 0 min  Stress: No Stress Concern Present (08/07/2022)   [employer] of Occupational Health - Occupational Stress Questionnaire    Feeling of Stress : Only a little  Social Connections: Unknown (08/07/2022)   Social Connection and Isolation Panel [NHANES]    Frequency of Communication with Friends and Family: Twice a week    Frequency of  Social Gatherings with Friends and Family: Twice a week    Attends Religious Services: Never    Database administrator or Organizations: No    Attends Engineer, structural: Never    Marital Status: Patient declined  Recent Concern: Social Connections - Moderately Isolated (05/31/2022)   Social Connection and Isolation Panel [NHANES]    Frequency of Communication with Friends and Family: Twice a week    Frequency of Social Gatherings with Friends and Family: Twice a week    Attends Religious Services: Never    Database administrator or Organizations: No    Attends Engineer, structural: Never    Marital Status: Living with partner    Allergies  Allergen Reactions   Penicillins Swelling and Rash    Has patient had a PCN reaction causing immediate rash, facial/tongue/throat swelling, SOB or  lightheadedness with hypotension: {Yes/No:30480221}Yes Has patient had a PCN reaction causing severe rash involving mucus membranes or skin necrosis: {Yes/No:30480221}No Has patient had a PCN reaction that required hospitalization {Yes/No:30480221}Yes Has patient had a PCN reaction occurring within the last 10 years: {Yes/No:30480221}No If all of the above answers are "NO", then may proceed with Cephalospori    Outpatient Medications Prior to Visit  Medication Sig Dispense Refill   amLODipine (NORVASC) 10 MG tablet Take 1 tablet (10 mg total) by mouth daily. 90 tablet 1   carvedilol (COREG) 25 MG tablet Take 1 tablet (25 mg total) by mouth 2 (two) times daily with a meal. 180 tablet 1   clindamycin (CLEOCIN) 300 MG capsule Take 1 capsule (300 mg total) by mouth 3 (three) times daily for 10 days. 30 capsule 0   fluticasone (FLONASE) 50 MCG/ACT nasal spray Place 2 sprays into both nostrils daily. 16 g 6   losartan (COZAAR) 100 MG tablet Take 1 tablet (100 mg total) by mouth daily. 90 tablet 1   methocarbamol (ROBAXIN) 500 MG tablet Take 1 tablet (500 mg total) by mouth 2 (two) times daily as needed for muscle spasms. 30 tablet 0   metoprolol tartrate (LOPRESSOR) 50 MG tablet Take 1 tablet by mouth 2 hours prior to CT scan 1 tablet 0   predniSONE (DELTASONE) 20 MG tablet Take 3 tablets (60 mg total) by mouth daily with breakfast for 9 days. 27 tablet 0   sildenafil (VIAGRA) 100 MG tablet Take 0.5-1 tablets (50-100 mg total) by mouth daily as needed for erectile dysfunction. 10 tablet 11   spironolactone (ALDACTONE) 50 MG tablet Take 1 tablet (50 mg total) by mouth daily. 90 tablet 1   Vitamin D, Ergocalciferol, (DRISDOL) 1.25 MG (50000 UNIT) CAPS capsule Take 1 capsule (50,000 Units total) by mouth every 7 (seven) days. 12 capsule 1   cholecalciferol (VITAMIN D3) 25 MCG (1000 UNIT) tablet Take 1 tablet (1,000 Units total) by mouth daily. (Patient not taking: Reported on 07/09/2022) 60 tablet 1    diclofenac Sodium (VOLTAREN) 1 % GEL Apply 2 g topically 4 (four) times daily. (Patient not taking: Reported on 07/09/2022) 100 g 2   naproxen (NAPROSYN) 500 MG tablet Take 1 tablet (500 mg total) by mouth 2 (two) times daily with a meal. FOR LOW BACK PAIN (Patient not taking: Reported on 07/26/2022) 60 tablet 0   No facility-administered medications prior to visit.     ROS Review of Systems  Constitutional:  Negative for activity change and appetite change.  HENT:  Negative for sinus pressure and sore throat.   Respiratory:  Negative for chest  tightness, shortness of breath and wheezing.   Cardiovascular:  Negative for chest pain and palpitations.  Gastrointestinal:  Negative for abdominal distention, abdominal pain and constipation.  Genitourinary: Negative.   Musculoskeletal: Negative.   Neurological:  Positive for facial asymmetry.  Psychiatric/Behavioral:  Negative for behavioral problems and dysphoric mood.     Objective:  BP 125/77   Pulse 74   Ht 6\' 2"  (1.88 m)   Wt 210 lb 6.4 oz (95.4 kg)   SpO2 98%   BMI 27.01 kg/m      08/07/2022    3:28 PM 07/30/2022    3:28 PM 07/29/2022    5:04 PM  BP/Weight  Systolic BP 125    Diastolic BP 77    Wt. (Lbs) 210.4 208 210  BMI 27.01 kg/m2 26.71 kg/m2 26.25 kg/m2      Physical Exam Constitutional:      Appearance: He is well-developed.  HENT:     Right Ear: There is impacted cerumen.     Left Ear: There is impacted cerumen.  Cardiovascular:     Rate and Rhythm: Normal rate.     Heart sounds: Normal heart sounds. No murmur heard. Pulmonary:     Effort: Pulmonary effort is normal.     Breath sounds: Normal breath sounds. No wheezing or rales.  Chest:     Chest wall: No tenderness.  Abdominal:     General: Bowel sounds are normal. There is no distension.     Palpations: Abdomen is soft. There is no mass.     Tenderness: There is no abdominal tenderness.  Musculoskeletal:        General: Normal range of motion.      Right lower leg: No edema.     Left lower leg: No edema.  Neurological:     Mental Status: He is alert and oriented to person, place, and time.     Cranial Nerves: Cranial nerve deficit (R facial palsy) present.  Psychiatric:        Mood and Affect: Mood normal.        Latest Ref Rng & Units 04/06/2022    9:13 AM 12/08/2021   10:44 AM 08/14/2021    4:33 PM  CMP  Glucose 70 - 99 mg/dL 960  454  91   BUN 6 - 20 mg/dL 14  16  8    Creatinine 0.61 - 1.24 mg/dL 0.98  1.19  1.47   Sodium 135 - 145 mmol/L 135  140  142   Potassium 3.5 - 5.1 mmol/L 4.1  4.5  4.2   Chloride 98 - 111 mmol/L 101  104  106   CO2 22 - 32 mmol/L 25  22  26    Calcium 8.9 - 10.3 mg/dL 9.1  82.9  9.3     Lipid Panel     Component Value Date/Time   CHOL 129 10/19/2020 1136   TRIG 114 10/19/2020 1136   HDL 39 (L) 10/19/2020 1136   CHOLHDL 3.3 10/19/2020 1136   LDLCALC 69 10/19/2020 1136    CBC    Component Value Date/Time   WBC 5.9 04/06/2022 0913   RBC 5.70 04/06/2022 0913   HGB 16.9 04/06/2022 0913   HGB 17.4 04/05/2022 1010   HCT 49.5 04/06/2022 0913   HCT 51.9 (H) 04/05/2022 1010   PLT 268 04/06/2022 0913   PLT 285 04/05/2022 1010   MCV 86.8 04/06/2022 0913   MCV 86 04/05/2022 1010   MCH 29.6 04/06/2022 0913   MCHC  34.1 04/06/2022 0913   RDW 13.3 04/06/2022 0913   RDW 12.9 04/05/2022 1010   LYMPHSABS 2.6 04/06/2022 0913   LYMPHSABS 2.7 04/05/2022 1010   MONOABS 0.4 04/06/2022 0913   EOSABS 0.1 04/06/2022 0913   EOSABS 0.1 04/05/2022 1010   BASOSABS 0.0 04/06/2022 0913   BASOSABS 0.0 04/05/2022 1010    Lab Results  Component Value Date   HGBA1C 5.9 (H) 04/27/2022    Assessment & Plan:  1. Bell's palsy Completed course of Valtrex Advised to complete course of prednisone Due to sedation with gabapentin I will place him on Cymbalta - Ambulatory referral to Physical Therapy - DULoxetine (CYMBALTA) 60 MG capsule; Take 1 capsule (60 mg total) by mouth daily for neuropathic pain   Dispense: 30 capsule; Refill: 3 - hydroxypropyl methylcellulose / hypromellose (ISOPTO TEARS / GONIOVISC) 2.5 % ophthalmic solution; Place 1 drop into the right eye 4 (four) times daily as needed for dry eyes.  Dispense: 15 mL; Refill: 1 - Ambulatory referral to Neurology  2. Bilateral impacted cerumen Bilateral ear irrigation performed    Meds ordered this encounter  Medications   DULoxetine (CYMBALTA) 60 MG capsule    Sig: Take 1 capsule (60 mg total) by mouth daily for neuropathic pain    Dispense:  30 capsule    Refill:  3   hydroxypropyl methylcellulose / hypromellose (ISOPTO TEARS / GONIOVISC) 2.5 % ophthalmic solution    Sig: Place 1 drop into the right eye 4 (four) times daily as needed for dry eyes.    Dispense:  15 mL    Refill:  1    Follow-up: Return for Medical conditions with PCP, keep previously scheduled appointment.       Hoy Register, MD, FAAFP. [facility] [place], [place] [phone]   08/07/2022, 6:30 PM

## 2022-08-07 NOTE — Patient Instructions (Signed)
Bell's Palsy, Adult  Bell's palsy is a short-term inability to move muscles in a part of the face. The inability to move, also called paralysis, results from inflammation or compression of the seventh cranial nerve. This nerve travels along the skull and under the ear to the side of the face. This nerve is responsible for facial movements that include blinking, closing the eyes, smiling, and frowning. What are the causes? The exact cause of this condition is not known. It may be caused by an infection from a virus, such as the chickenpox (herpes zoster), Epstein-Barr, or mumps virus. What increases the risk? You are more likely to develop this condition if: You are pregnant. You have diabetes. You have had a recent infection in your nose, throat, or airways. You have a weakened body defense system (immune system). You have had a facial injury, such as a fracture. You have a family history of Bell's palsy. What are the signs or symptoms? Symptoms of this condition include: Weakness on one side of the face. Drooping eyelid and corner of the mouth. Excessive tearing in one eye. Difficulty closing the eyelid. Dry eye. Drooling. Dry mouth. Changes in taste. Change in facial appearance. Pain behind one ear. Ringing in one or both ears. Sensitivity to sound in one ear. Facial twitching. Headache. Impaired speech. Dizziness. Difficulty eating or drinking. Most of the time, only one side of the face is affected. In rare cases, Bell's palsy may affect the whole face. How is this diagnosed? This condition is diagnosed based on: Your symptoms. Your medical history. A physical exam. You may also have to see health care providers who specialize in disorders of the nerves (neurologist) or diseases and conditions of the eye (ophthalmologist). You may have tests, such as: A test to check for nerve damage (electromyogram). Imaging studies, such as a CT scan or an MRI. Blood tests. How is this  treated? This condition affects every person differently. Sometimes symptoms go away without treatment within a couple weeks. If treatment is needed, it varies from person to person. The goal of treatment is to reduce inflammation and protect the eye from damage. Treatment for Bell's palsy may include: Medicines, such as: Steroids to reduce swelling and inflammation. Antiviral medicines. Pain relievers, including aspirin, acetaminophen, or ibuprofen. Eye drops or ointment to keep your eye moist. Eye protection, if you cannot close your eye. Exercises or massage to regain muscle strength and function (physical therapy). Follow these instructions at home:  Take over-the-counter and prescription medicines only as told by your health care provider. If your eye is affected: Keep your eye moist with eye drops or ointment as told by your health care provider. Follow instructions for eye care and protection as told by your health care provider. Do any physical therapy exercises as told by your health care provider. Keep all follow-up visits. This is important. Contact a health care provider if: You have a fever or chills. Your symptoms do not get better within 2-3 weeks, or your symptoms get worse. Your eye is red, irritated, or painful. You have new symptoms. Get help right away if: You have weakness or numbness in a part of your body other than your face. You have trouble swallowing. You develop neck pain or stiffness. You develop dizziness or shortness of breath. Summary Bell's palsy is a short-term inability to move muscles in a part of the face. The inability to move results from inflammation or compression of the facial nerve. This condition affects every person  differently. Sometimes symptoms go away without treatment within a couple weeks. If treatment is needed, it varies from person to person. The goal of treatment is to reduce inflammation and protect the eye from damage. Contact  your health care provider if your symptoms do not get better within 2-3 weeks, or your symptoms get worse. This information is not intended to replace advice given to you by your health care provider. Make sure you discuss any questions you have with your health care provider. Document Revised: 11/26/2019 Document Reviewed: 11/26/2019 Elsevier Patient Education  2024 [employer].

## 2022-08-08 ENCOUNTER — Other Ambulatory Visit: Payer: Self-pay

## 2022-08-13 ENCOUNTER — Other Ambulatory Visit: Payer: Self-pay | Admitting: Neurology

## 2022-08-13 ENCOUNTER — Ambulatory Visit (INDEPENDENT_AMBULATORY_CARE_PROVIDER_SITE_OTHER): Payer: Medicaid Other | Admitting: Neurology

## 2022-08-13 ENCOUNTER — Encounter: Payer: Self-pay | Admitting: Neurology

## 2022-08-13 ENCOUNTER — Telehealth: Payer: Self-pay | Admitting: Neurology

## 2022-08-13 VITALS — BP 126/84 | HR 71 | Ht 74.6 in | Wt 204.4 lb

## 2022-08-13 DIAGNOSIS — R519 Headache, unspecified: Secondary | ICD-10-CM

## 2022-08-13 DIAGNOSIS — Q309 Congenital malformation of nose, unspecified: Secondary | ICD-10-CM | POA: Diagnosis not present

## 2022-08-13 DIAGNOSIS — J3489 Other specified disorders of nose and nasal sinuses: Secondary | ICD-10-CM

## 2022-08-13 DIAGNOSIS — G510 Bell's palsy: Secondary | ICD-10-CM | POA: Diagnosis not present

## 2022-08-13 DIAGNOSIS — R29810 Facial weakness: Secondary | ICD-10-CM

## 2022-08-13 DIAGNOSIS — J342 Deviated nasal septum: Secondary | ICD-10-CM | POA: Diagnosis not present

## 2022-08-13 NOTE — Telephone Encounter (Signed)
CT [employer] [facility]-auth# [id number] exp. 09/12/2022 scheduled at GI on 6/5  MRI is pending faxed notes for GI

## 2022-08-13 NOTE — Telephone Encounter (Signed)
Referral faxed to [facility]: Phone: [phone] Fax: [phone]

## 2022-08-13 NOTE — Telephone Encounter (Signed)
MRI [employer] med[employer] auth: [id number] exp. 08/13/2022-09/12/2022 sent to GI [phone]

## 2022-08-13 NOTE — Patient Instructions (Addendum)
MRI brain w/wo contrast Blood work CT maxillofacial - today Send to Dr. Suszanne Conners ENT  Orders Placed This Encounter  Procedures   MR BRAIN W WO CONTRAST   CT MAXILLOFACIAL W & WO CONTRAST   HIV Antibody (routine testing w rflx)   Sjogren's syndrome antibods(ssa + ssb)   Angiotensin converting enzyme   Lyme Disease Serology w/Reflex   Basic Metabolic Panel   Ambulatory referral to ENT   Calls: [place] [facility] and Dr. Suszanne Conners (ENT)   Bell's Palsy, Adult  Bell's palsy is a short-term inability to move muscles in a part of the face. The inability to move, also called paralysis, results from inflammation or compression of the seventh cranial nerve. This nerve travels along the skull and under the ear to the side of the face. This nerve is responsible for facial movements that include blinking, closing the eyes, smiling, and frowning. What are the causes? The exact cause of this condition is not known. It may be caused by an infection from a virus, such as the chickenpox (herpes zoster), Epstein-Barr, or mumps virus. What increases the risk? You are more likely to develop this condition if: You are pregnant. You have diabetes. You have had a recent infection in your nose, throat, or airways. You have a weakened body defense system (immune system). You have had a facial injury, such as a fracture. You have a family history of Bell's palsy. What are the signs or symptoms? Symptoms of this condition include: Weakness on one side of the face. Drooping eyelid and corner of the mouth. Excessive tearing in one eye. Difficulty closing the eyelid. Dry eye. Drooling. Dry mouth. Changes in taste. Change in facial appearance. Pain behind one ear. Ringing in one or both ears. Sensitivity to sound in one ear. Facial twitching. Headache. Impaired speech. Dizziness. Difficulty eating or drinking. Most of the time, only one side of the face is affected. In rare cases, Bell's palsy may affect  the whole face. How is this diagnosed? This condition is diagnosed based on: Your symptoms. Your medical history. A physical exam. You may also have to see health care providers who specialize in disorders of the nerves (neurologist) or diseases and conditions of the eye (ophthalmologist). You may have tests, such as: A test to check for nerve damage (electromyogram). Imaging studies, such as a CT scan or an MRI. Blood tests. How is this treated? This condition affects every person differently. Sometimes symptoms go away without treatment within a couple weeks. If treatment is needed, it varies from person to person. The goal of treatment is to reduce inflammation and protect the eye from damage. Treatment for Bell's palsy may include: Medicines, such as: Steroids to reduce swelling and inflammation. Antiviral medicines. Pain relievers, including aspirin, acetaminophen, or ibuprofen. Eye drops or ointment to keep your eye moist. Eye protection, if you cannot close your eye. Exercises or massage to regain muscle strength and function (physical therapy). Follow these instructions at home:  Take over-the-counter and prescription medicines only as told by your health care provider. If your eye is affected: Keep your eye moist with eye drops or ointment as told by your health care provider. Follow instructions for eye care and protection as told by your health care provider. Do any physical therapy exercises as told by your health care provider. Keep all follow-up visits. This is important. Contact a health care provider if: You have a fever or chills. Your symptoms do not get better within 2-3 weeks, or your  symptoms get worse. Your eye is red, irritated, or painful. You have new symptoms. Get help right away if: You have weakness or numbness in a part of your body other than your face. You have trouble swallowing. You develop neck pain or stiffness. You develop dizziness or shortness  of breath. Summary Bell's palsy is a short-term inability to move muscles in a part of the face. The inability to move results from inflammation or compression of the facial nerve. This condition affects every person differently. Sometimes symptoms go away without treatment within a couple weeks. If treatment is needed, it varies from person to person. The goal of treatment is to reduce inflammation and protect the eye from damage. Contact your health care provider if your symptoms do not get better within 2-3 weeks, or your symptoms get worse. This information is not intended to replace advice given to you by your health care provider. Make sure you discuss any questions you have with your health care provider. Document Revised: 11/26/2019 Document Reviewed: 11/26/2019 Elsevier Patient Education  2024 [employer].

## 2022-08-13 NOTE — Progress Notes (Signed)
[facility]    Provider:  Dr Lucia Gaskins Requesting Provider: Hoy Register, MD Primary Care Provider:  [name], MD  CC:  Bells palsy  HPI:  [name] is a 47 y.o. male here as requested by Hoy Register, MD for Bells palsy. PMH has HTN (hypertension); Periodontitis; Pain in left wrist; Subcutaneous nodules; Inguinal hernia of left side without obstruction or gangrene; Former tobacco use; Localized swelling, mass and lump, multiple sites; Pre-ulcerative corn or callous; Disorder of left Achilles tendon; Prediabetes; Vitamin D deficiency; and Neck pain on their problem list. I reviewed Dr. Felipa Furnace notes, seen at the ED 07/29/22 for Bell's palsy and sinusitis.  He was given doxycycline earlier prior to ED presentation but noticed drooping of the right side of his face while driving to work, he received prescription for Valtrex, prednisone, and the clindamycin at the ED.  He was advised to follow-up with neurology, he has pain on the right side of his face 7 is 10 described as discomfort, also otalgia and difficulty eating and drinking, he still in his dose of prednisone and clindamycin but has completed the Valtrex, he has difficulty shutting his right eye.   I reviewed his history and in 2016 there was a polypoid opacity in the upper right nasal cavity with loss of the upper bony nasal septum 15 mm x 9 mm at that time there is no right anterior cranial fossa erosion and no sinusitis but he was recommended to go to ENT.  I reviewed notes in epic and in care everywhere and did not see any ENT notes.  It does look as though he saw neurosurgery in 2017 but I cannot access that note. His ear was bothering him Thursday and Friday he went to the mobile bus on the 16th, she looked in his ear and said it was read and looked like an ear infection. She c=gave him doxycycline and the net day he noticed he couldn't blink. Sore ness in the ear, no hyperacusis. Like aching. He then  noticed the facial droop. The next day he went to the ED. He is miserable, drooling right side of the mouth. He was given instructions, got steroids and valtrex and clindamycin. His ear still hurts, if he swallows he can hear it pop. His face hurts, he still has congestion. Tearing eye. Can close right eye but decsresed blink    Reviewed notes, labs and imaging from outside physicians, which showed:  06/22/2014: CLINICAL DATA:  Headache around the temples for 4 days.   EXAM: CT HEAD WITHOUT CONTRAST   TECHNIQUE: Contiguous axial images were obtained from the base of the skull through the vertex without intravenous contrast.   COMPARISON:  None.   FINDINGS: Skull and Sinuses:There is a polypoid opacity in the upper right nasal cavity with loss of the upper bony nasal septum. The structure measures 15 mm in height by 9 mm in maximal thickness. No erosion of the anterior cranial fossa floor. No sinusitis.   Orbits: No acute abnormality.   Brain: No evidence of acute infarction, hemorrhage, hydrocephalus, or mass lesion/mass effect.   IMPRESSION: 1. Negative intracranial imaging. 2. 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 Recommend ENT referral for visualization.  04/27/2022: hgba1c 5.9, vitamin D 14,  11/2021: tsh, ft4 normal     Latest Ref Rng & Units 04/06/2022    9:13 AM 04/05/2022   10:10 AM 12/08/2021   10:44 AM  CBC  WBC 4.0 -  10.5 K/uL 5.9  6.4  8.1   Hemoglobin 13.0 - 17.0 g/dL 16.1  09.6  04.5   Hematocrit 39.0 - 52.0 % 49.5  51.9  50.7   Platelets 150 - 400 K/uL 268  285  302        Latest Ref Rng & Units 04/06/2022    9:13 AM 12/08/2021   10:44 AM 08/14/2021    4:33 PM  CMP  Glucose 70 - 99 mg/dL 409  811  91   BUN 6 - 20 mg/dL 14  16  8    Creatinine 0.61 - 1.24 mg/dL 9.14  7.82  9.56   Sodium 135 - 145 mmol/L 135  140  142   Potassium 3.5 - 5.1 mmol/L 4.1  4.5  4.2   Chloride 98 - 111 mmol/L 101  104  106   CO2 22 - 32 mmol/L 25   22  26    Calcium 8.9 - 10.3 mg/dL 9.1  21.3  9.3      Recent Results (from the past 2160 hour(s))  Exercise Tolerance Test     Status: None   Collection Time: 07/30/22  3:32 PM  Result Value Ref Range   Rest HR 93.0 bpm   Rest BP 129/95 mmHg   Exercise duration (min) 8 min   Exercise duration (sec) 16 sec   Estimated workload 10.1    Peak HR 148 bpm   Peak BP 223/106 mmHg   MPHR 172 bpm   Percent HR 86.0 %   Angina Index 0    Duke Treadmill Score 8    ST Depression (mm) 0 mm     Review of Systems: Patient complains of symptoms per HPI as well as the following symptoms faciial pain. Pertinent negatives and positives per HPI. All others negative.   Social History   Socioeconomic History   Marital status: Single    Spouse name: Not on file   Number of children: Not on file   Years of education: Not on file   Highest education level: Not on file  Occupational History   Not on file  Tobacco Use   Smoking status: Every Day    Packs/day: 1    Types: Cigars, Cigarettes    Last attempt to quit: 05/10/2015    Years since quitting: 7.2   Smokeless tobacco: Never  Vaping Use   Vaping Use: Never used  Substance and Sexual Activity   Alcohol use: Yes    Comment: occ   Drug use: Yes    Types: Marijuana   Sexual activity: Not on file  Other Topics Concern   Not on file  Social History Narrative   Not on file   Social Determinants of Health   Financial Resource Strain: Low Risk  (05/31/2022)   Overall Financial Resource Strain (CARDIA)    Difficulty of Paying Living Expenses: Not very hard  Food Insecurity: No Food Insecurity (05/31/2022)   Hunger Vital Sign    Worried About Running Out of Food in the Last Year: Never true    Ran Out of Food in the Last Year: Never true  Transportation Needs: No Transportation Needs (05/31/2022)   PRAPARE - Administrator, Civil Service (Medical): No    Lack of Transportation (Non-Medical): No  Physical Activity: Unknown  (08/07/2022)   Exercise Vital Sign    Days of Exercise per Week: 5 days    Minutes of Exercise per Session: Patient declined  Recent Concern: Physical Activity -  Inactive (05/31/2022)   Exercise Vital Sign    Days of Exercise per Week: 0 days    Minutes of Exercise per Session: 0 min  Stress: No Stress Concern Present (08/07/2022)   [employer] of Occupational Health - Occupational Stress Questionnaire    Feeling of Stress : Only a little  Social Connections: Unknown (08/07/2022)   Social Connection and Isolation Panel [NHANES]    Frequency of Communication with Friends and Family: Twice a week    Frequency of Social Gatherings with Friends and Family: Twice a week    Attends Religious Services: Never    Database administrator or Organizations: No    Attends Engineer, structural: Never    Marital Status: Patient declined  Recent Concern: Social Connections - Moderately Isolated (05/31/2022)   Social Connection and Isolation Panel [NHANES]    Frequency of Communication with Friends and Family: Twice a week    Frequency of Social Gatherings with Friends and Family: Twice a week    Attends Religious Services: Never    Database administrator or Organizations: No    Attends Banker Meetings: Never    Marital Status: Living with partner  Intimate Partner Violence: Not At Risk (05/31/2022)   Humiliation, Afraid, Rape, and Kick questionnaire    Fear of Current or Ex-Partner: No    Emotionally Abused: No    Physically Abused: No    Sexually Abused: No    Family History  Problem Relation Age of Onset   Cancer Mother     Past Medical History:  Diagnosis Date   Hypertension 2003   dx age 58    Wears glasses     Patient Active Problem List   Diagnosis Date Noted   Neck pain 08/14/2021   Prediabetes 01/28/2021   Vitamin D deficiency 01/28/2021   Pre-ulcerative corn or callous 02/01/2019   Disorder of left Achilles tendon 02/01/2019   Inguinal hernia of  left side without obstruction or gangrene 12/08/2016   Former tobacco use 12/08/2016   Localized swelling, mass and lump, multiple sites 12/08/2016   Subcutaneous nodules 07/09/2016   Periodontitis 05/27/2015   Pain in left wrist 05/27/2015   HTN (hypertension) 05/12/2015    Past Surgical History:  Procedure Laterality Date   CHOLECYSTECTOMY      Current Outpatient Medications  Medication Sig Dispense Refill   amLODipine (NORVASC) 10 MG tablet Take 1 tablet (10 mg total) by mouth daily. 90 tablet 1   BIOTIN PO Take 1,000 mg by mouth daily.     carvedilol (COREG) 25 MG tablet Take 1 tablet (25 mg total) by mouth 2 (two) times daily with a meal. 180 tablet 1   losartan (COZAAR) 100 MG tablet Take 1 tablet (100 mg total) by mouth daily. 90 tablet 1   methocarbamol (ROBAXIN) 500 MG tablet Take 1 tablet (500 mg total) by mouth 2 (two) times daily as needed for muscle spasms. 30 tablet 0   spironolactone (ALDACTONE) 50 MG tablet Take 1 tablet (50 mg total) by mouth daily. 90 tablet 1   Vitamin D, Ergocalciferol, (DRISDOL) 1.25 MG (50000 UNIT) CAPS capsule Take 1 capsule (50,000 Units total) by mouth every 7 (seven) days. 12 capsule 1   DULoxetine (CYMBALTA) 60 MG capsule Take 1 capsule (60 mg total) by mouth daily for neuropathic pain (Patient not taking: Reported on 08/13/2022) 30 capsule 3   hydroxypropyl methylcellulose / hypromellose (ISOPTO TEARS / GONIOVISC) 2.5 % ophthalmic solution Place 1 drop  into the right eye 4 (four) times daily as needed for dry eyes. (Patient not taking: Reported on 08/13/2022) 15 mL 1   metoprolol tartrate (LOPRESSOR) 50 MG tablet Take 1 tablet by mouth 2 hours prior to CT scan (Patient not taking: Reported on 08/13/2022) 1 tablet 0   sildenafil (VIAGRA) 100 MG tablet Take 0.5-1 tablets (50-100 mg total) by mouth daily as needed for erectile dysfunction. (Patient not taking: Reported on 08/13/2022) 10 tablet 11   No current facility-administered medications for this  visit.    Allergies as of 08/13/2022 - Review Complete 08/13/2022  Allergen Reaction Noted   Doxycycline Other (See Comments) 08/13/2022   Penicillins Swelling and Rash 05/19/2010    Vitals: BP 126/84   Pulse 71   Ht 6' 2.6" (1.895 m)   Wt 204 lb 6.4 oz (92.7 kg)   BMI 25.82 kg/m  Last Weight:  Wt Readings from Last 1 Encounters:  08/13/22 204 lb 6.4 oz (92.7 kg)   Last Height:   Ht Readings from Last 1 Encounters:  08/13/22 6' 2.6" (1.895 m)     Physical exam: Exam: Gen: NAD, conversant, well nourised, obese, well groomed                     CV: RRR, no MRG. No Carotid Bruits. No peripheral edema, warm, nontender Eyes: Conjunctivae clear without exudates or hemorrhage  Neuro: Detailed Neurologic Exam  Speech:    Speech is normal; fluent and spontaneous with normal comprehension.  Cognition:    The patient is oriented to person, place, and time;     recent and remote memory intact;     language fluent;     normal attention, concentration,     fund of knowledge Cranial Nerves:    The pupils are equal, round, and reactive to light. The fundi are flat Visual fields are full to finger confrontation. Extraocular movements are intact. Trigeminal sensationimpaured o right. Right lower facial droop, cannot close eyes tightly with bels phenomenon, drooling right corner of mouth, cannot raise riht eyebrow. The palate elevates in the midline. Hearing intact. Voice is normal. Shoulder shrug is normal. The tongue has normal motion without fasciculations.   Coordination: nml  Gait: nml  Motor Observation:    No asymmetry, no atrophy, and no involuntary movements noted. Tone:    Normal muscle tone.    Posture:    Posture is normal. normal erect    Strength:    Strength is V/V in the upper and lower limbs.      Sensation: intact to LT     Reflex Exam:  DTR's:    Deep tendon reflexes in the upper and lower extremities are normal bilaterally.   Toes:    The toes  are downgoing bilaterally.   Clonus:    Clonus is absent.    Assessment/Plan:  Patient with a nasal mass seen on 2016 with eroision of the nasal septum Also cranial nerve 5 and 7 palsy  MRI brain: Left 7th and 5th nerve palsy need MRI brain and labs to evaluate for Other conditions -- such as a stroke, infections, Lyme or other systemic diseases also demyelinating disease, space occupying mass.  CT maxillofacial ALso had a septal mass eroding the septal bone not examinated found in 2016 with facial pain. Will also send for CT Maxillofacial. Coul dhave eroded into the skull to the brain. Need to look for abscess, impact on the crnial nerves, invasion into the brain or nasal  cavities.  FINDINGS: 2016  Skull and Sinuses:There is a polypoid opacity in the upper right nasal cavity with loss of the upper bony nasal septum. The structure measures 15 mm in height by 9 mm in maximal thickness. No erosion of the anterior cranial fossa floor. No sinusitis.        Orders Placed This Encounter  Procedures   MR BRAIN W WO CONTRAST   CT MAXILLOFACIAL W & WO CONTRAST   HIV Antibody (routine testing w rflx)   Sjogren's syndrome antibods(ssa + ssb)   Angiotensin converting enzyme   Lyme Disease Serology w/Reflex   Basic Metabolic Panel   Ambulatory referral to ENT   No orders of the defined types were placed in this encounter.  Discussed caring for eye so cornea does not dry out   Cc: Hoy Register, MD,  [name], MD  Naomie Dean, MD  [facility] [address] [place], [place] [address]  Phone [phone] Fax [phone]

## 2022-08-14 ENCOUNTER — Other Ambulatory Visit: Payer: Self-pay

## 2022-08-14 LAB — BASIC METABOLIC PANEL
BUN/Creatinine Ratio: 15 (ref 9–20)
BUN: 12 mg/dL (ref 6–24)
CO2: 24 mmol/L (ref 20–29)
Calcium: 9.2 mg/dL (ref 8.7–10.2)
Chloride: 100 mmol/L (ref 96–106)
Creatinine, Ser: 0.81 mg/dL (ref 0.76–1.27)
Glucose: 123 mg/dL — ABNORMAL HIGH (ref 70–99)
Potassium: 4.4 mmol/L (ref 3.5–5.2)
Sodium: 139 mmol/L (ref 134–144)
eGFR: 109 mL/min/{1.73_m2} (ref >59)

## 2022-08-14 LAB — HIV ANTIBODY (ROUTINE TESTING W REFLEX): HIV Screen 4th Generation wRfx: NONREACTIVE

## 2022-08-14 LAB — LYME DISEASE SEROLOGY W/REFLEX: Lyme Total Antibody EIA: NEGATIVE

## 2022-08-14 LAB — SJOGREN'S SYNDROME ANTIBODS(SSA + SSB)
ENA SSA (RO) Ab: <0.2 AI (ref 0.0–0.9)
ENA SSB (LA) Ab: <0.2 AI (ref 0.0–0.9)

## 2022-08-14 LAB — ANGIOTENSIN CONVERTING ENZYME: Angio Convert Enzyme: 45 U/L (ref 14–82)

## 2022-08-15 ENCOUNTER — Ambulatory Visit
Admission: RE | Admit: 2022-08-15 | Discharge: 2022-08-15 | Disposition: A | Discharge status: Home / Self Care | Payer: Medicaid Other | Source: Ambulatory Visit | Attending: Neurology | Admitting: Neurology

## 2022-08-15 DIAGNOSIS — Q309 Congenital malformation of nose, unspecified: Secondary | ICD-10-CM

## 2022-08-15 DIAGNOSIS — J342 Deviated nasal septum: Secondary | ICD-10-CM

## 2022-08-15 DIAGNOSIS — R29810 Facial weakness: Secondary | ICD-10-CM

## 2022-08-15 DIAGNOSIS — J3489 Other specified disorders of nose and nasal sinuses: Secondary | ICD-10-CM

## 2022-08-15 DIAGNOSIS — R519 Headache, unspecified: Secondary | ICD-10-CM

## 2022-08-15 MED ORDER — IOPAMIDOL (ISOVUE-300) INJECTION 61%
75.0000 mL | Freq: Once | INTRAVENOUS | Status: AC | PRN
  Administered 2022-08-15: 75 mL via INTRAVENOUS

## 2022-08-16 ENCOUNTER — Ambulatory Visit: Payer: Medicaid Other | Admitting: Physical Therapy

## 2022-08-16 NOTE — Therapy (Deleted)
OUTPATIENT PHYSICAL THERAPY CERVICAL EVALUATION   Patient Name: [name] MRN: [id number] DOB:1975-08-24, 47 y.o., male Today's Date: 08/16/2022  END OF SESSION:   Past Medical History:  Diagnosis Date   Hypertension 2003   dx age 68    Wears glasses    Past Surgical History:  Procedure Laterality Date   CHOLECYSTECTOMY     Patient Active Problem List   Diagnosis Date Noted   Neck pain 08/14/2021   Prediabetes 01/28/2021   Vitamin D deficiency 01/28/2021   Pre-ulcerative corn or callous 02/01/2019   Disorder of left Achilles tendon 02/01/2019   Inguinal hernia of left side without obstruction or gangrene 12/08/2016   Former tobacco use 12/08/2016   Localized swelling, mass and lump, multiple sites 12/08/2016   Subcutaneous nodules 07/09/2016   Periodontitis 05/27/2015   Pain in left wrist 05/27/2015   HTN (hypertension) 05/12/2015    PCP: ***  REFERRING PROVIDER: ***  REFERRING DIAG: ***  THERAPY DIAG:  No diagnosis found.  Rationale for Evaluation and Treatment: {HABREHAB:27488}  ONSET DATE: ***  SUBJECTIVE:                                                                                                                                                                                                         SUBJECTIVE STATEMENT: *** Hand dominance: {MISC; OT HAND DOMINANCE:[id number]}  PERTINENT HISTORY:  Per referring physician note: 1. Bell's palsy Completed course of Valtrex Advised to complete course of prednisone Due to sedation with gabapentin I will place him on Cymbalta - Ambulatory referral to Physical Therapy - DULoxetine (CYMBALTA) 60 MG capsule; Take 1 capsule (60 mg total) by mouth daily for neuropathic pain  Dispense: 30 capsule; Refill: 3 - hydroxypropyl methylcellulose / hypromellose (ISOPTO TEARS / GONIOVISC) 2.5 % ophthalmic solution; Place 1 drop into the right eye 4 (four) times daily as needed for dry eyes.  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  CT maxillofacial ALso had a septal mass eroding the septal bone not examinated found in 2016 with facial pain. Will also send for CT Maxillofacial. Coul dhave eroded into the skull to the brain. Need to look for abscess, impact on the crnial nerves, invasion into the brain or nasal cavities.  PAIN:  Are you having pain? {OPRCPAIN:27236}  PRECAUTIONS: {Therapy precautions:24002}  WEIGHT BEARING RESTRICTIONS: {Yes ***/No:24003}  FALLS:  Has patient fallen in last 6 months? {fallsyesno:27318}  LIVING ENVIRONMENT: Lives with: {OPRC lives with:25569::"lives with their family"} Lives in: {Lives in:25570} Stairs: {opstairs:27293} Has following equipment at home: {Assistive devices:23999}  OCCUPATION: ***  PLOF: {PLOF:24004}  PATIENT GOALS: ***  NEXT MD VISIT: ***  OBJECTIVE:   DIAGNOSTIC FINDINGS:  CT   IMPRESSION: 1. Negative intracranial imaging. 2. 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 Recommend ENT referral for visualization.  PATIENT SURVEYS:  {rehab surveys:24030}  COGNITION: Overall cognitive status: {cognition:24006}  SENSATION: {sensation:27233}  POSTURE: {posture:25561}  PALPATION: ***   CERVICAL ROM:   {AROM/PROM:27142} ROM A/PROM (deg) eval  Flexion   Extension   Right lateral flexion   Left lateral flexion   Right rotation   Left rotation    (Blank rows = not tested)  UPPER EXTREMITY ROM:  {AROM/PROM:27142} ROM Right eval Left eval  Shoulder flexion    Shoulder extension    Shoulder abduction    Shoulder adduction    Shoulder extension    Shoulder internal rotation    Shoulder external rotation    Elbow flexion    Elbow extension    Wrist flexion    Wrist  extension    Wrist ulnar deviation    Wrist radial deviation    Wrist pronation    Wrist supination     (Blank rows = not tested)  UPPER EXTREMITY MMT:  MMT Right eval Left eval  Shoulder flexion    Shoulder extension    Shoulder abduction    Shoulder adduction    Shoulder extension    Shoulder internal rotation    Shoulder external rotation    Middle trapezius    Lower trapezius    Elbow flexion    Elbow extension    Wrist flexion    Wrist extension    Wrist ulnar deviation    Wrist radial deviation    Wrist pronation    Wrist supination    Grip strength     (Blank rows = not tested)  CERVICAL SPECIAL TESTS:  {Cervical special tests:25246}  FUNCTIONAL TESTS:  {Functional tests:24029}  TODAY'S TREATMENT:                                                                                                                              DATE: ***   PATIENT EDUCATION:  Education details: *** Person educated: {Person educated:25204} Education method: {Education Method:25205} Education comprehension: {Education Comprehension:25206}  HOME EXERCISE PROGRAM: ***  ASSESSMENT:  CLINICAL IMPRESSION: Patient is a *** y.o. *** who was seen today for physical therapy evaluation and treatment for ***.   OBJECTIVE IMPAIRMENTS: {opptimpairments:25111}.   ACTIVITY LIMITATIONS: {activitylimitations:27494}  PARTICIPATION LIMITATIONS: {participationrestrictions:25113}  PERSONAL FACTORS: {Personal factors:25162} are also affecting patient's functional outcome.   REHAB POTENTIAL: {rehabpotential:25112}  CLINICAL DECISION MAKING: {clinical decision making:25114}  EVALUATION COMPLEXITY: {Evaluation complexity:25115}   GOALS: Goals reviewed with patient? {yes/no:20286}  SHORT TERM GOALS:  Target date: ***  *** Baseline:  Goal status: {GOALSTATUS:25110}  2.  *** Baseline:  Goal status: {GOALSTATUS:25110}  3.  *** Baseline:  Goal status: {GOALSTATUS:25110}  4.   *** Baseline:  Goal status: {GOALSTATUS:25110}  5.  *** Baseline:  Goal status: {GOALSTATUS:25110}  6.  *** Baseline:  Goal status: {GOALSTATUS:25110}  LONG TERM GOALS: Target date: ***  *** Baseline:  Goal status: {GOALSTATUS:25110}  2.  *** Baseline:  Goal status: {GOALSTATUS:25110}  3.  *** Baseline:  Goal status: {GOALSTATUS:25110}  4.  *** Baseline:  Goal status: {GOALSTATUS:25110}  5.  *** Baseline:  Goal status: {GOALSTATUS:25110}  6.  *** Baseline:  Goal status: {GOALSTATUS:25110}   PLAN:  PT FREQUENCY: {rehab frequency:25116}  PT DURATION: {rehab duration:25117}  PLANNED INTERVENTIONS: {rehab planned interventions:25118::"Therapeutic exercises","Therapeutic activity","Neuromuscular re-education","Balance training","Gait training","Patient/Family education","Self Care","Joint mobilization"}  PLAN FOR NEXT SESSION: ***   Iona Beard, DPT 08/16/2022, 9:14 AM

## 2022-08-17 ENCOUNTER — Encounter: Payer: Self-pay | Admitting: Nurse Practitioner

## 2022-08-17 ENCOUNTER — Telehealth: Payer: Medicaid Other | Admitting: Nurse Practitioner

## 2022-08-17 DIAGNOSIS — G510 Bell's palsy: Secondary | ICD-10-CM | POA: Diagnosis not present

## 2022-08-17 NOTE — Progress Notes (Deleted)
Virtual Visit Consent   [name], you are scheduled for a virtual visit with a [facility] provider today. Just as with appointments in the office, your consent must be obtained to participate. Your consent will be active for this visit and any virtual visit you may have with one of our providers in the next 365 days. If you have a MyChart account, a copy of this consent can be sent to you electronically.  As this is a virtual visit, video technology does not allow for your provider to perform a traditional examination. This may limit your provider's ability to fully assess your condition. If your provider identifies any concerns that need to be evaluated in person or the need to arrange testing (such as labs, EKG, etc.), we will make arrangements to do so. Although advances in technology are sophisticated, we cannot ensure that it will always work on either your end or our end. If the connection with a video visit is poor, the visit may have to be switched to a telephone visit. With either a video or telephone visit, we are not always able to ensure that we have a secure connection.  By engaging in this virtual visit, you consent to the provision of healthcare and authorize for your insurance to be billed (if applicable) for the services provided during this visit. Depending on your insurance coverage, you may receive a charge related to this service.  I need to obtain your verbal consent now. Are you willing to proceed with your visit today? [name] has provided verbal consent on 08/17/2022 for a virtual visit (video or telephone). [name], FNP  Date: 08/17/2022 10:52 AM  Virtual Visit via Video Note   I, [name], connected with  [name]  ([id number], [birth year]) on 08/17/22 at 10:15 AM EDT by a video-enabled telemedicine application and verified that I am speaking with the correct person using two identifiers.  Location: Patient: Virtual Visit Location Patient:  Home Provider: Virtual Visit Location Provider: Home Office   I discussed the limitations of evaluation and management by telemedicine and the availability of in person appointments. The patient expressed understanding and agreed to proceed.    History of Present Illness: [name] is a 47 y.o. 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 Developed Bell's Palsy the day after taking antibiotic.   Penicillins Swelling and Rash    Has patient had a PCN reaction causing immediate rash, facial/tongue/throat swelling, SOB or lightheadedness with hypotension: {Yes/No:30480221}Yes Has patient had a  PCN reaction causing severe rash involving mucus membranes or skin necrosis: {Yes/No:30480221}No Has patient had a PCN reaction that required hospitalization {Yes/No:30480221}Yes Has patient had a PCN reaction occurring within the last 10 years: {Yes/No:30480221}No If all of the above answers are "NO", then may proceed with Cephalospori   Medications:  Current Outpatient Medications:    amLODipine (NORVASC)  10 MG tablet, Take 1 tablet (10 mg total) by mouth daily., Disp: 90 tablet, Rfl: 1   BIOTIN PO, Take 1,000 mg by mouth daily., Disp: , Rfl:    carvedilol (COREG) 25 MG tablet, Take 1 tablet (25 mg total) by mouth 2 (two) times daily with a meal., Disp: 180 tablet, Rfl: 1   DULoxetine (CYMBALTA) 60 MG capsule, Take 1 capsule (60 mg total) by mouth daily for neuropathic pain (Patient not taking: Reported on 08/13/2022), Disp: 30 capsule, Rfl: 3   hydroxypropyl methylcellulose / hypromellose (ISOPTO TEARS / GONIOVISC) 2.5 % ophthalmic solution, Place 1 drop into the right eye 4 (four) times daily as needed for dry eyes. (Patient not taking: Reported on 08/13/2022), Disp: 15 mL, Rfl: 1   losartan (COZAAR) 100 MG tablet, Take 1 tablet (100 mg total) by mouth daily., Disp: 90 tablet, Rfl: 1   methocarbamol (ROBAXIN) 500 MG tablet, Take 1 tablet (500 mg total) by mouth 2 (two) times daily as needed for muscle spasms., Disp: 30 tablet, Rfl: 0   metoprolol tartrate (LOPRESSOR) 50 MG tablet, Take 1 tablet by mouth 2 hours prior to CT scan (Patient not taking: Reported on 08/13/2022), Disp: 1 tablet, Rfl: 0   sildenafil (VIAGRA) 100 MG tablet, Take 0.5-1 tablets (50-100 mg total) by mouth daily as needed for erectile dysfunction. (Patient not taking: Reported on 08/13/2022), Disp: 10 tablet, Rfl: 11   spironolactone (ALDACTONE) 50 MG tablet, Take 1 tablet (50 mg total) by mouth daily., Disp: 90 tablet, Rfl: 1   Vitamin D, Ergocalciferol, (DRISDOL) 1.25 MG (50000 UNIT) CAPS capsule, Take 1 capsule (50,000 Units total) by mouth every 7 (seven) days., Disp: 12 capsule, Rfl: 1  Observations/Objective: Patient is well-developed, well-nourished in no acute distress.  Resting comfortably  at home.  Head is normocephalic, atraumatic.  No labored breathing.  Speech is clear and coherent with logical content.  Patient is alert and oriented at baseline.  Left side palsy noted able to open Eye with effort   Assessment and  Plan: 1. Bell's palsy  Follow up with PCP/Neurology as needed Will provide work note for today as requested   Follow Up Instructions: I discussed the assessment and treatment plan with the patient. The patient was provided an opportunity to ask questions and all were answered. The patient agreed with the plan and demonstrated an understanding of the instructions.  A copy of instructions were sent to the patient via MyChart unless otherwise noted below.    The patient was advised to call back or seek an in-person evaluation if the symptoms worsen or if the condition fails to improve as anticipated.  Time:  I spent 10 minutes with the patient via telehealth technology discussing the above problems/concerns.    [name], FNP

## 2022-08-17 NOTE — Progress Notes (Signed)
See previous note

## 2022-08-17 NOTE — Progress Notes (Signed)
Virtual Visit Consent   [name], you are scheduled for a virtual visit with a [facility] provider today. Just as with appointments in the office, your consent must be obtained to participate. Your consent will be active for this visit and any virtual visit you may have with one of our providers in the next 365 days. If you have a MyChart account, a copy of this consent can be sent to you electronically.  As this is a virtual visit, video technology does not allow for your provider to perform a traditional examination. This may limit your provider's ability to fully assess your condition. If your provider identifies any concerns that need to be evaluated in person or the need to arrange testing (such as labs, EKG, etc.), we will make arrangements to do so. Although advances in technology are sophisticated, we cannot ensure that it will always work on either your end or our end. If the connection with a video visit is poor, the visit may have to be switched to a telephone visit. With either a video or telephone visit, we are not always able to ensure that we have a secure connection.  By engaging in this virtual visit, you consent to the provision of healthcare and authorize for your insurance to be billed (if applicable) for the services provided during this visit. Depending on your insurance coverage, you may receive a charge related to this service.  I need to obtain your verbal consent now. Are you willing to proceed with your visit today? [name] has provided verbal consent on 08/17/2022 for a virtual visit (video or telephone). [name]  Date: 08/17/2022 11:57 AM  Virtual Visit via Video Note   I, [name], connected with  [name]  ([id number], [birth year]) on 08/17/22 at 10:15 AM EDT by a video-enabled telemedicine application and verified that I am speaking with the correct person using two identifiers.  Location: Patient: Virtual Visit Location Patient:  Home Provider: Virtual Visit Location Provider: Home Office   I discussed the limitations of evaluation and management by telemedicine and the availability of in person appointments. The patient expressed understanding and agreed to proceed.    History of Present Illness: [name] is a 47 y.o. who identifies as a male who was assigned male at birth, and is being seen today for follow up on Bell's Palsy  He has been previously diagnosed and is being managed by Neurology currently  No new symptoms today aside from increase in pain and inability to work due to pain today   He is planning to return to work tomorrow  He is using Tylenol for pain relief .  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   Inguinal hernia of left side without obstruction or gangrene 12/08/2016   Former tobacco use 12/08/2016   Localized swelling, mass and lump, multiple sites 12/08/2016   Subcutaneous nodules 07/09/2016   Periodontitis 05/27/2015   Pain in left wrist 05/27/2015   HTN (hypertension) 05/12/2015      Medications:  Current Outpatient Medications:    amLODipine (NORVASC) 10 MG tablet, Take 1 tablet (10 mg total) by mouth daily., Disp: 90 tablet, Rfl: 1   BIOTIN PO, Take 1,000 mg by mouth daily., Disp: , Rfl:    carvedilol (  COREG) 25 MG tablet, Take 1 tablet (25 mg total) by mouth 2 (two) times daily with a meal., Disp: 180 tablet, Rfl: 1   DULoxetine (CYMBALTA) 60 MG capsule, Take 1 capsule (60 mg total) by mouth daily for neuropathic pain (Patient not taking: Reported on 08/13/2022), Disp: 30 capsule, Rfl: 3   hydroxypropyl methylcellulose / hypromellose (ISOPTO TEARS / GONIOVISC) 2.5 % ophthalmic solution, Place 1 drop into the right eye 4 (four) times daily as needed for dry eyes. (Patient not  taking: Reported on 08/13/2022), Disp: 15 mL, Rfl: 1   losartan (COZAAR) 100 MG tablet, Take 1 tablet (100 mg total) by mouth daily., Disp: 90 tablet, Rfl: 1   methocarbamol (ROBAXIN) 500 MG tablet, Take 1 tablet (500 mg total) by mouth 2 (two) times daily as needed for muscle spasms., Disp: 30 tablet, Rfl: 0   metoprolol tartrate (LOPRESSOR) 50 MG tablet, Take 1 tablet by mouth 2 hours prior to CT scan (Patient not taking: Reported on 08/13/2022), Disp: 1 tablet, Rfl: 0   sildenafil (VIAGRA) 100 MG tablet, Take 0.5-1 tablets (50-100 mg total) by mouth daily as needed for erectile dysfunction. (Patient not taking: Reported on 08/13/2022), Disp: 10 tablet, Rfl: 11   spironolactone (ALDACTONE) 50 MG tablet, Take 1 tablet (50 mg total) by mouth daily., Disp: 90 tablet, Rfl: 1   Vitamin D, Ergocalciferol, (DRISDOL) 1.25 MG (50000 UNIT) CAPS capsule, Take 1 capsule (50,000 Units total) by mouth every 7 (seven) days., Disp: 12 capsule, Rfl: 1  Observations/Objective: Patient is well-developed, well-nourished in no acute distress.  Resting comfortably  at home.  Head is normocephalic, atraumatic.  No labored breathing.  Speech is clear and coherent with logical content.  Patient is alert and oriented at baseline.  Palsy noted to left face, able to open eye with effort   Assessment and Plan: 1. Bell's palsy  Work note provided for today  Advised to follow up with Neurology as planned for ongoing management  Patient is agreeable to plan   Follow Up Instructions: I discussed the assessment and treatment plan with the patient. The patient was provided an opportunity to ask questions and all were answered. The patient agreed with the plan and demonstrated an understanding of the instructions.  A copy of instructions were sent to the patient via MyChart unless otherwise noted below.    The patient was advised to call back or seek an in-person evaluation if the symptoms worsen or if the condition fails to  improve as anticipated.  Time:  I spent 11 minutes with the patient via telehealth technology discussing the above problems/concerns.    [name]

## 2022-08-18 ENCOUNTER — Other Ambulatory Visit: Payer: Self-pay

## 2022-08-18 ENCOUNTER — Encounter (HOSPITAL_BASED_OUTPATIENT_CLINIC_OR_DEPARTMENT_OTHER): Payer: Self-pay

## 2022-08-18 ENCOUNTER — Emergency Department (HOSPITAL_BASED_OUTPATIENT_CLINIC_OR_DEPARTMENT_OTHER)
Admission: EM | Admit: 2022-08-18 | Discharge: 2022-08-18 | Disposition: A | Discharge status: Home / Self Care | Payer: Medicaid Other | Attending: Emergency Medicine | Admitting: Emergency Medicine

## 2022-08-18 ENCOUNTER — Emergency Department (HOSPITAL_BASED_OUTPATIENT_CLINIC_OR_DEPARTMENT_OTHER): Payer: Medicaid Other

## 2022-08-18 DIAGNOSIS — Z7901 Long term (current) use of anticoagulants: Secondary | ICD-10-CM | POA: Diagnosis not present

## 2022-08-18 DIAGNOSIS — M79661 Pain in right lower leg: Secondary | ICD-10-CM | POA: Diagnosis present

## 2022-08-18 DIAGNOSIS — I82411 Acute embolism and thrombosis of right femoral vein: Secondary | ICD-10-CM | POA: Diagnosis not present

## 2022-08-18 LAB — CBC WITH DIFFERENTIAL/PLATELET
Abs Immature Granulocytes: 0.01 10*3/uL (ref 0.00–0.07)
Basophils Absolute: 0 10*3/uL (ref 0.0–0.1)
Basophils Relative: 1 %
Eosinophils Absolute: 0.2 10*3/uL (ref 0.0–0.5)
Eosinophils Relative: 3 %
HCT: 43 % (ref 39.0–52.0)
Hemoglobin: 14.9 g/dL (ref 13.0–17.0)
Immature Granulocytes: 0 %
Lymphocytes Relative: 43 %
Lymphs Abs: 3.2 10*3/uL (ref 0.7–4.0)
MCH: 29.3 pg (ref 26.0–34.0)
MCHC: 34.7 g/dL (ref 30.0–36.0)
MCV: 84.5 fL (ref 80.0–100.0)
Monocytes Absolute: 0.5 10*3/uL (ref 0.1–1.0)
Monocytes Relative: 7 %
Neutro Abs: 3.4 10*3/uL (ref 1.7–7.7)
Neutrophils Relative %: 46 %
Platelets: 139 10*3/uL — ABNORMAL LOW (ref 150–400)
RBC: 5.09 MIL/uL (ref 4.22–5.81)
RDW: 14.1 % (ref 11.5–15.5)
WBC: 7.3 10*3/uL (ref 4.0–10.5)
nRBC: 0 % (ref 0.0–0.2)

## 2022-08-18 LAB — BASIC METABOLIC PANEL
Anion gap: 8 (ref 5–15)
BUN: 15 mg/dL (ref 6–20)
CO2: 28 mmol/L (ref 22–32)
Calcium: 8.7 mg/dL — ABNORMAL LOW (ref 8.9–10.3)
Chloride: 99 mmol/L (ref 98–111)
Creatinine, Ser: 1.23 mg/dL (ref 0.61–1.24)
GFR, Estimated: >60 mL/min (ref >60)
Glucose, Bld: 103 mg/dL — ABNORMAL HIGH (ref 70–99)
Potassium: 4.3 mmol/L (ref 3.5–5.1)
Sodium: 135 mmol/L (ref 135–145)

## 2022-08-18 MED ORDER — APIXABAN (ELIQUIS) VTE STARTER PACK (10MG AND 5MG)
ORAL_TABLET | ORAL | 0 refills | Status: DC
  Filled 2022-08-18: qty 74, fill #0

## 2022-08-18 MED ORDER — APIXABAN 2.5 MG PO TABS
10.0000 mg | ORAL_TABLET | Freq: Once | ORAL | Status: AC
  Administered 2022-08-18: 10 mg via ORAL
  Filled 2022-08-18: qty 4

## 2022-08-18 MED ORDER — APIXABAN (ELIQUIS) VTE STARTER PACK (10MG AND 5MG): ORAL_TABLET | ORAL | 0 refills | Status: DC

## 2022-08-18 NOTE — Discharge Instructions (Addendum)
You were seen here today for pain to your right calf.  As discussed in the room you have a blood clot.  We have started you on a medication to help with this.  If you develop chest pain, shortness of breath, worsening pain, swelling, color change to your leg please seek reevaluation for the emergency department  Please take the blood thinning medication at home as prescribed.  If you develop coughing up blood, bleeding from your gums, nosebleed, blood in urine please seek reevaluation  Make sure to call your primary care provider Monday and let them know you are seen and have a blood clot.  I have placed a referral to the blood clot clinic.  They may be calling you to schedule an appointment.

## 2022-08-18 NOTE — ED Provider Notes (Signed)
[name] EM[facility]Y DEPARTMENT AT [facility] Provider Note   CSN: [id number] Arrival date & time: 08/18/22  2030     History  Chief Complaint  Patient presents with   Leg Swelling    [name] is a 47 y.o. male past medical history here for evaluation of right calf pain.  Began yesterday.  No known injury or trauma.  He does work for [employer] and does a lot of heavy lifting and walking.  No redness, warmth.  Notices more pain when he flexes at the calf.  No history of PE or DVT.  No recent surgery, immobilization malignancy.  No chest pain, shortness of breath, fever, numbness or weakness  HPI     Home Medications Prior to Admission medications   Medication Sig Start Date End Date Taking? Authorizing Provider  APIXABAN Everlene Balls) VTE STARTER PACK (10MG  AND 5MG ) Take as directed on package: start with two-5mg  tablets twice daily for 7 days. On day 8, switch to one-5mg  tablet twice daily. 08/18/22  Yes Willy Vorce A, PA-C  amLODipine (NORVASC) 10 MG tablet Take 1 tablet (10 mg total) by mouth daily. 04/27/22   [name], MD  BIOTIN PO Take 1,000 mg by mouth daily.    [provider]  carvedilol (COREG) 25 MG tablet Take 1 tablet (25 mg total) by mouth 2 (two) times daily with a meal. 04/27/22   [name], MD  DULoxetine (CYMBALTA) 60 MG capsule Take 1 capsule (60 mg total) by mouth daily for neuropathic pain Patient not taking: Reported on 08/13/2022 08/07/22   Hoy Register, MD  hydroxypropyl methylcellulose / hypromellose (ISOPTO TEARS / GONIOVISC) 2.5 % ophthalmic solution Place 1 drop into the right eye 4 (four) times daily as needed for dry eyes. Patient not taking: Reported on 08/13/2022 08/07/22   Hoy Register, MD  losartan (COZAAR) 100 MG tablet Take 1 tablet (100 mg total) by mouth daily. 04/27/22   [name], MD  methocarbamol (ROBAXIN) 500 MG tablet Take 1 tablet (500 mg total) by mouth 2 (two) times daily as needed for  muscle spasms. 07/15/22   Claiborne Rigg, NP  metoprolol tartrate (LOPRESSOR) 50 MG tablet Take 1 tablet by mouth 2 hours prior to CT scan Patient not taking: Reported on 08/13/2022 08/01/22   Maisie Fus, MD  sildenafil (VIAGRA) 100 MG tablet Take 0.5-1 tablets (50-100 mg total) by mouth daily as needed for erectile dysfunction. Patient not taking: Reported on 08/13/2022 04/20/22   Claiborne Rigg, NP  spironolactone (ALDACTONE) 50 MG tablet Take 1 tablet (50 mg total) by mouth daily. 04/27/22   [name], MD  Vitamin D, Ergocalciferol, (DRISDOL) 1.25 MG (50000 UNIT) CAPS capsule Take 1 capsule (50,000 Units total) by mouth every 7 (seven) days. 06/01/22   [name], MD      Allergies    Doxycycline and Penicillins    Review of Systems   Review of Systems  Constitutional: Negative.   HENT: Negative.    Respiratory: Negative.    Cardiovascular: Negative.   Gastrointestinal: Negative.   Genitourinary: Negative.   Musculoskeletal:        Right calf pain  Skin: Negative.   Neurological: Negative.   All other systems reviewed and are negative.   Physical Exam Updated Vital Signs BP 122/89 (BP Location: Right Arm)   Pulse 74   Temp 99.1 F (37.3 C) (Oral)   Resp 16   Ht 6' 2.5" (1.892 m)  Wt 93.4 kg   SpO2 98%   BMI 26.10 kg/m  Physical Exam Vitals and nursing note reviewed.  Constitutional:      General: He is not in acute distress.    Appearance: He is well-developed. He is not ill-appearing, toxic-appearing or diaphoretic.  HENT:     Head: Normocephalic and atraumatic.  Eyes:     Pupils: Pupils are equal, round, and reactive to light.  Cardiovascular:     Rate and Rhythm: Normal rate and regular rhythm.     Pulses: Normal pulses.          Dorsalis pedis pulses are 2+ on the right side and 2+ on the left side.       Posterior tibial pulses are 2+ on the right side.     Heart sounds: Normal heart sounds.  Pulmonary:     Effort: Pulmonary effort is  normal. No respiratory distress.  Abdominal:     General: There is no distension.     Palpations: Abdomen is soft.  Musculoskeletal:        General: Normal range of motion.     Cervical back: Normal range of motion and neck supple.     Comments: No bony tenderness, full range of motion, compartment soft.  Mild tenderness right proximal calf.  Skin:    General: Skin is warm and dry.     Capillary Refill: Capillary refill takes less than 2 seconds.     Comments: No edema, erythema or warmth.  No fluctuance or induration.  Neurological:     General: No focal deficit present.     Mental Status: He is alert and oriented to person, place, and time.     ED Results / Procedures / Treatments   Labs (all labs ordered are listed, but only abnormal results are displayed) Labs Reviewed  CBC WITH DIFFERENTIAL/PLATELET - Abnormal; Notable for the following components:      Result Value   Platelets 139 (*)    All other components within normal limits  BASIC METABOLIC PANEL - Abnormal; Notable for the following components:   Glucose, Bld 103 (*)    Calcium 8.7 (*)    All other components within normal limits    EKG None  Radiology US Venous Img Lower Unilateral Right  Result Date: 08/18/2022 CLINICAL DATA:  Right calf pain and swelling x1 day. EXAM: RIGHT LOWER EXTREMITY VENOUS DOPPLER ULTRASOUND TECHNIQUE: Gray-scale sonography with graded compression, as well as color Doppler and duplex ultrasound were performed to evaluate the lower extremity deep venous systems from the level of the common femoral vein and including the common femoral, femoral, profunda femoral, popliteal and calf veins including the posterior tibial, peroneal and gastrocnemius veins when visible. The superficial great saphenous vein was also interrogated. Spectral Doppler was utilized to evaluate flow at rest and with distal augmentation maneuvers in the common femoral, femoral and popliteal veins. COMPARISON:  None  Available. FINDINGS: Contralateral Common Femoral Vein: Respiratory phasicity is normal and symmetric with the symptomatic side. No evidence of thrombus. Normal compressibility. Common Femoral Vein: No evidence of thrombus. Normal compressibility, respiratory phasicity and response to augmentation. Saphenofemoral Junction: No evidence of thrombus. Normal compressibility and flow on color Doppler imaging. Profunda Femoral Vein: No evidence of thrombus. Normal compressibility and flow on color Doppler imaging. Femoral Vein: Evidence of occlusive thrombus with abnormal compressibility, respiratory phasicity and response to augmentation. Popliteal Vein: Evidence of occlusive thrombus with abnormal compressibility, respiratory phasicity and response to augmentation. Calf Veins: Evidence  of occlusive thrombus with abnormal compressibility and flow on color Doppler imaging. Superficial Great Saphenous Vein: No evidence of thrombus. Normal compressibility. Venous Reflux:  None. Other Findings:  None. IMPRESSION: Findings consistent with occlusive thrombus within the distal RIGHT femoral vein, RIGHT popliteal vein, RIGHT posterior tibial vein and RIGHT peroneal vein. Electronically Signed   By: Aram Candela M.D.   On: 08/18/2022 21:16    Procedures Procedures    Medications Ordered in ED Medications  apixaban (ELIQUIS) tablet 10 mg (10 mg Oral Given 08/18/22 2227)    ED Course/ Medical Decision Making/ A&P    47 year old here for evaluation of right calf pain, onset yesterday. No recent injury or trauma. Walks a lot, works at [employer]. No redness, warmth. No CP, SOB. No hx of PE, DVT, recent surgery immobilization, malignancy. NV intact. Some tenderness to proximal to mid calf. No crepitus. Compartments soft. Will plan on [place] to r/o DVT. Exam not consistent with abscess, cellulitis, nec fascitis, arterial occlusion, compartment syndrome.  Labs and imaging personally viewed and interpreted:  [place] positive for  distal femoral, popliteal, tibial dvt CBC without leukocytosis BMP without significant abnormality No contraindications to anticoagulation. No CP, SOB.  Will start on anticoag. Referral placed to the DVT clinic. He will return for new or worsening symptoms.  First dose Eliquis when here in the emergency department  The patient has been appropriately medically screened and/or stabilized in the ED. I have low suspicion for any other emergent medical condition which would require further screening, evaluation or treatment in the ED or require inpatient management.  Patient is hemodynamically stable and in no acute distress.  Patient able to ambulate in department prior to ED.  Evaluation does not show acute pathology that would require ongoing or additional emergent interventions while in the emergency department or further inpatient treatment.  I have discussed the diagnosis with the patient and answered all questions.  Pain is been managed while in the emergency department and patient has no further complaints prior to discharge.  Patient is comfortable with plan discussed in room and is stable for discharge at this time.  I have discussed strict return precautions for returning to the emergency department.  Patient was encouraged to follow-up with PCP/specialist refer to at discharge.                            Medical Decision Making Amount and/or Complexity of Data Reviewed External Data Reviewed: labs, radiology and notes. Labs: ordered. Decision-making details documented in ED Course. Radiology: ordered and independent interpretation performed. Decision-making details documented in ED Course.  Risk OTC drugs. Prescription drug management. Decision regarding hospitalization. Diagnosis or treatment significantly limited by social determinants of health.          Final Clinical Impression(s) / ED Diagnoses Final diagnoses:  Acute deep vein thrombosis (DVT) of femoral vein of right  lower extremity (HCC)    Rx / DC Orders ED Discharge Orders          Ordered    AMB Referral to Deep Vein Thrombosis Clinic       Comments: Please call [phone] with any questions.   08/18/22 2124    APIXABAN (ELIQUIS) VTE STARTER PACK (10MG  AND 5MG )       Note to Pharmacy: If starter pack unavailable, substitute with seventy-four 5 mg apixaban tabs following the above SIG directions.   08/18/22 2217  Brad Lieurance A, PA-C 08/18/22 2236    Cathren Laine, MD 08/19/22 2135

## 2022-08-18 NOTE — ED Triage Notes (Signed)
Pt reports R calf swelling, redness since last night.

## 2022-08-20 ENCOUNTER — Other Ambulatory Visit: Payer: Self-pay

## 2022-08-21 ENCOUNTER — Ambulatory Visit (HOSPITAL_COMMUNITY)
Admission: RE | Admit: 2022-08-21 | Discharge: 2022-08-21 | Disposition: A | Discharge status: Home / Self Care | Payer: 59 | Source: Ambulatory Visit | Attending: Surgery | Admitting: Surgery

## 2022-08-21 ENCOUNTER — Encounter (HOSPITAL_COMMUNITY): Payer: Self-pay

## 2022-08-21 ENCOUNTER — Other Ambulatory Visit: Payer: Self-pay

## 2022-08-21 VITALS — BP 127/77 | HR 76

## 2022-08-21 DIAGNOSIS — I82411 Acute embolism and thrombosis of right femoral vein: Secondary | ICD-10-CM

## 2022-08-21 MED ORDER — APIXABAN 5 MG PO TABS
5.0000 mg | ORAL_TABLET | Freq: Two times a day (BID) | ORAL | 5 refills | Status: DC
  Filled 2022-08-21 – 2022-09-18 (×2): qty 60, 30d supply, fill #0
  Filled 2022-10-18 – 2022-10-25 (×2): qty 60, 30d supply, fill #1
  Filled 2023-01-09 – 2023-01-21 (×2): qty 60, 30d supply, fill #2

## 2022-08-21 NOTE — Progress Notes (Cosign Needed Addendum)
DVT Clinic Note  Name: [name]     MRN: [id number]     DOB: 09/22/1975     Sex: male  PCP: [name], MD  Today's Visit: Visit Information: Initial Visit  Referred to DVT Clinic by: Ralph Leyden, PA-C [facility])  Referred to CPP by: Dr. Myra Gianotti Reason for referral:  Chief Complaint  Patient presents with   Med Management - DVT   HISTORY OF PRESENT ILLNESS: [name] is a 47 y.o. male with PMH HTN, Bell's palsy, who presents after diagnosis of DVT for medication management. Patient began experiencing calf pain on 08/17/22 and presented to the ED 08/18/22. [place] at that time showed acute occlusive DVT involving the right femoral, popliteal, posterior tibial, and peroneal veins. He was started on Eliquis and referred to the DVT Clinic for follow up.   Today, the patient reports that he is still in pain but has started to see some improvement in swelling. He required use of a wheelchair today. He filled Eliquis after his ED visit and has been taking it with no missed doses. No bruising or bleeding. He denies family history of VTE. No recent injuries, travel, periods of immobility. Patient works for [employer] so he does a lot of walking throughout the day.   Positive Thrombotic Risk Factors: Smoking (Stopped smoking at DVT diagnosis but previously smoked 2 cigars/day) Bleeding Risk Factors: Anticoagulant therapy  Negative Thrombotic Risk Factors: Previous VTE, Recent surgery (within 3 months), Recent trauma (within 3 months), Recent admission to hospital with acute illness (within 3 months), Sedentary journey lasting >8 hours within 4 weeks, Testosterone therapy, Estrogen therapy, Recent COVID diagnosis (within 3 months), Recent cesarean section (within 3 months), Within 6 weeks postpartum, Erythropoiesis-stimulating agent, Central venous catheterization, Pregnancy, Bed rest >72 hours within 3 months, Paralysis, paresis, or recent plaster cast immobilization of  lower extremity, Active cancer, Non-malignant, chronic inflammatory condition, Known thrombophilic condition, Obesity, Older age  Rx Insurance Coverage: Medicaid Rx Affordability: Eliquis is $4/month Preferred Pharmacy: [facility] to [facility]  Past Medical History:  Diagnosis Date   Hypertension 2003   dx age 70    Wears glasses     Past Surgical History:  Procedure Laterality Date   CHOLECYSTECTOMY      Social History   Socioeconomic History   Marital status: Single    Spouse name: Not on file   Number of children: Not on file   Years of education: Not on file   Highest education level: Not on file  Occupational History   Not on file  Tobacco Use   Smoking status: Every Day    Packs/day: 1    Types: Cigars, Cigarettes    Last attempt to quit: 05/10/2015    Years since quitting: 7.2   Smokeless tobacco: Never  Vaping Use   Vaping Use: Never used  Substance and Sexual Activity   Alcohol use: Yes    Comment: occ   Drug use: Yes    Types: Marijuana   Sexual activity: Not on file  Other Topics Concern   Not on file  Social History Narrative   Not on file   Social Determinants of Health   Financial Resource Strain: Low Risk  (05/31/2022)   Overall Financial Resource Strain (CARDIA)    Difficulty of Paying Living Expenses: Not very hard  Food Insecurity: No Food Insecurity (05/31/2022)   Hunger Vital Sign    Worried About Running Out of  Food in the Last Year: Never true    Ran Out of Food in the Last Year: Never true  Transportation Needs: No Transportation Needs (05/31/2022)   PRAPARE - Administrator, Civil Service (Medical): No    Lack of Transportation (Non-Medical): No  Physical Activity: Unknown (08/07/2022)   Exercise Vital Sign    Days of Exercise per Week: 5 days    Minutes of Exercise per Session: Patient declined  Recent Concern: Physical Activity - Inactive (05/31/2022)   Exercise Vital Sign    Days of  Exercise per Week: 0 days    Minutes of Exercise per Session: 0 min  Stress: No Stress Concern Present (08/07/2022)   [employer] of Occupational Health - Occupational Stress Questionnaire    Feeling of Stress : Only a little  Social Connections: Unknown (08/07/2022)   Social Connection and Isolation Panel [NHANES]    Frequency of Communication with Friends and Family: Twice a week    Frequency of Social Gatherings with Friends and Family: Twice a week    Attends Religious Services: Never    Database administrator or Organizations: No    Attends Engineer, structural: Never    Marital Status: Patient declined  Recent Concern: Social Connections - Moderately Isolated (05/31/2022)   Social Connection and Isolation Panel [NHANES]    Frequency of Communication with Friends and Family: Twice a week    Frequency of Social Gatherings with Friends and Family: Twice a week    Attends Religious Services: Never    Database administrator or Organizations: No    Attends Engineer, structural: Never    Marital Status: Living with partner  Intimate Partner Violence: Not At Risk (05/31/2022)   Humiliation, Afraid, Rape, and Kick questionnaire    Fear of Current or Ex-Partner: No    Emotionally Abused: No    Physically Abused: No    Sexually Abused: No    Family History  Problem Relation Age of Onset   Cancer Mother     Allergies as of 08/21/2022 - Review Complete 08/21/2022  Allergen Reaction Noted   Doxycycline Other (See Comments) 08/13/2022   Penicillins Swelling and Rash 05/19/2010    Current Outpatient Medications on File Prior to Encounter  Medication Sig Dispense Refill   amLODipine (NORVASC) 10 MG tablet Take 1 tablet (10 mg total) by mouth daily. 90 tablet 1   APIXABAN (ELIQUIS) VTE STARTER PACK (10MG  AND 5MG ) Take as directed on package: start with two-5mg  tablets twice daily for 7 days. On day 8, switch to one-5mg  tablet twice daily. 74 each 0   carvedilol  (COREG) 25 MG tablet Take 1 tablet (25 mg total) by mouth 2 (two) times daily with a meal. 180 tablet 1   losartan (COZAAR) 100 MG tablet Take 1 tablet (100 mg total) by mouth daily. 90 tablet 1   methocarbamol (ROBAXIN) 500 MG tablet Take 1 tablet (500 mg total) by mouth 2 (two) times daily as needed for muscle spasms. 30 tablet 0   spironolactone (ALDACTONE) 50 MG tablet Take 1 tablet (50 mg total) by mouth daily. 90 tablet 1   Vitamin D, Ergocalciferol, (DRISDOL) 1.25 MG (50000 UNIT) CAPS capsule Take 1 capsule (50,000 Units total) by mouth every 7 (seven) days. 12 capsule 1   BIOTIN PO Take 1,000 mg by mouth daily.     DULoxetine (CYMBALTA) 60 MG capsule Take 1 capsule (60 mg total) by mouth daily for neuropathic pain (Patient  not taking: Reported on 08/13/2022) 30 capsule 3   hydroxypropyl methylcellulose / hypromellose (ISOPTO TEARS / GONIOVISC) 2.5 % ophthalmic solution Place 1 drop into the right eye 4 (four) times daily as needed for dry eyes. (Patient not taking: Reported on 08/13/2022) 15 mL 1   metoprolol tartrate (LOPRESSOR) 50 MG tablet Take 1 tablet by mouth 2 hours prior to CT scan (Patient not taking: Reported on 08/13/2022) 1 tablet 0   sildenafil (VIAGRA) 100 MG tablet Take 0.5-1 tablets (50-100 mg total) by mouth daily as needed for erectile dysfunction. (Patient not taking: Reported on 08/13/2022) 10 tablet 11   No current facility-administered medications on file prior to encounter.   REVIEW OF SYSTEMS:  Review of Systems  Respiratory:  Negative for shortness of breath.   Cardiovascular:  Positive for leg swelling. Negative for chest pain and palpitations.  Musculoskeletal:  Positive for myalgias.  Neurological:  Positive for tingling. Negative for dizziness.   PHYSICAL EXAMINATION:  Vitals:   08/21/22 1443  BP: 127/77  Pulse: 76  SpO2: 99%   Physical Exam Vitals reviewed.  Cardiovascular:     Rate and Rhythm: Normal rate.  Pulmonary:     Effort: Pulmonary effort is  normal.  Musculoskeletal:        General: Tenderness present.     Right lower leg: Edema present.     Left lower leg: No edema.  Skin:    Findings: Erythema present. No bruising.  Psychiatric:        Mood and Affect: Mood normal.        Behavior: Behavior normal.        Thought Content: Thought content normal.   Villalta Score for Post-Thrombotic Syndrome: Pain: Severe Cramps: Severe Heaviness: Moderate Paresthesia: Severe Pruritus: Absent Pretibial Edema: Mild Skin Induration: Absent Hyperpigmentation: Absent Redness: Mild Venous Ectasia: Absent Pain on calf compression: Mild Villalta Preliminary Score: 14 Is venous ulcer present?: No If venous ulcer is present and score is <15, then 15 points total are assigned: Absent Villalta Total Score: 14  LABS:  CBC     Component Value Date/Time   WBC 7.3 08/18/2022 2126   RBC 5.09 08/18/2022 2126   HGB 14.9 08/18/2022 2126   HGB 17.4 04/05/2022 1010   HCT 43.0 08/18/2022 2126   HCT 51.9 (H) 04/05/2022 1010   PLT 139 (L) 08/18/2022 2126   PLT 285 04/05/2022 1010   MCV 84.5 08/18/2022 2126   MCV 86 04/05/2022 1010   MCH 29.3 08/18/2022 2126   MCHC 34.7 08/18/2022 2126   RDW 14.1 08/18/2022 2126   RDW 12.9 04/05/2022 1010   LYMPHSABS 3.2 08/18/2022 2126   LYMPHSABS 2.7 04/05/2022 1010   MONOABS 0.5 08/18/2022 2126   EOSABS 0.2 08/18/2022 2126   EOSABS 0.1 04/05/2022 1010   BASOSABS 0.0 08/18/2022 2126   BASOSABS 0.0 04/05/2022 1010    Hepatic Function      Component Value Date/Time   PROT 8.5 (H) 08/28/2020 1557   PROT 8.0 01/29/2019 1044   ALBUMIN 4.1 08/28/2020 1557   ALBUMIN 4.3 01/29/2019 1044   AST 19 08/28/2020 1557   ALT 25 08/28/2020 1557   ALKPHOS 86 08/28/2020 1557   BILITOT 0.6 08/28/2020 1557   BILITOT 0.7 01/29/2019 1044   BILIDIR 0.2 05/19/2010 0315   IBILI 0.5 05/19/2010 0315    Renal Function   Lab Results  Component Value Date   CREATININE 1.23 08/18/2022   CREATININE 0.81 08/13/2022    CREATININE 0.86 04/06/2022  Estimated Creatinine Clearance: 86.6 mL/min (by C-G formula based on SCr of 1.23 mg/dL).   [facility] Vascular Lab Studies:  08/18/22 doppler IMPRESSION: Findings consistent with occlusive thrombus within the distal RIGHT femoral vein, RIGHT popliteal vein, RIGHT posterior tibial vein and RIGHT peroneal vein.  ASSESSMENT: Location of DVT: Right femoral vein, Right popliteal vein, Right distal vein Cause of DVT: unprovoked per patient history and interview today. Will refer patient to hematology for further work up. There is no involvement of the common femoral vein so no role for vascular surgery intervention at this time. Patient has been adherent thus far to Eliquis with no adverse effects. I counseled the patient extensively on Eliquis today and provided him refills to his preferred pharmacy. He would benefit from compression and elevation.   PLAN: -Continue apixaban (Eliquis) 10 mg twice daily for 7 days followed by 5 mg twice daily. -Expected duration of therapy: Likely long-term given unprovoked DVT. Will defer to hematology after further work up. Therapy started on 08/18/22. -Patient educated on purpose, proper use and potential adverse effects of apixaban (Eliquis). -Discussed importance of taking medication around the same time every day. -Advised patient of medications to avoid (NSAIDs, aspirin doses >100 mg daily). -Educated that Tylenol (acetaminophen) is the preferred analgesic to lower the risk of bleeding. -Advised patient to alert all providers of anticoagulation therapy prior to starting a new medication or having a procedure. -Emphasized importance of monitoring for signs and symptoms of bleeding (abnormal bruising, prolonged bleeding, nose bleeds, bleeding from gums, discolored urine, black tarry stools). -Educated patient to present to the ED if emergent signs and symptoms of new thrombosis occur. -Counseled patient to wear compression stockings daily,  removing at night.  Follow up: 6 weeks in DVT Clinic  Pervis Hocking, PharmD, [place], CPP Deep Vein Thrombosis Clinic Clinical Pharmacist Practitioner Office: [phone]  I have evaluated the patient's chart/imaging and refer this patient to the Clinical Pharmacist Practitioner for medication management. I have reviewed the CPP's documentation and agree with her assessment and plan. I was immediately available during the visit for questions and collaboration.   Durene Cal, MD

## 2022-08-21 NOTE — Patient Instructions (Signed)
-  Continue apixaban (Eliquis) 10 mg twice daily for 7 days followed by 5 mg twice daily. -Your refills have been sent to [facility]. You may need to call the pharmacy to ask them to fill this when you start to run low on your current supply.  -It is important to take your medication around the same time every day.  -Avoid NSAIDs like ibuprofen (Advil, Motrin) and naproxen (Aleve) as well as aspirin doses over 100 mg daily. -Tylenol (acetaminophen) is the preferred over the counter pain medication to lower the risk of bleeding. -Be sure to alert all of your health care providers that you are taking an anticoagulant prior to starting a new medication or having a procedure. -Monitor for signs and symptoms of bleeding (abnormal bruising, prolonged bleeding, nose bleeds, bleeding from gums, discolored urine, black tarry stools). If you have fallen and hit your head OR if your bleeding is severe or not stopping, seek emergency care.  -Go to the emergency room if emergent signs and symptoms of new clot occur (new or worse swelling and pain in an arm or leg, shortness of breath, chest pain, fast or irregular heartbeats, lightheadedness, dizziness, fainting, coughing up blood) or if you experience a significant color change (pale or blue) in the extremity that has the DVT.  -We recommend you wear compression stockings (20-30 mmHg) as long as you are having swelling or pain. Be sure to purchase the correct size and take them off at night.   Your next visit is on July 22 at 2pm.  [facility] [address] [place], [place], [place] [address] Enter the hospital through Entrance C off [facility] and pull up to the Heart & Vascular Center entrance to the free valet parking.  Check in for your appointment at the Heart & Vascular Center.   If you have any questions or need to reschedule an appointment, please call [phone] [facility].  If you are having an emergency, call 911 or  present to the nearest emergency room.   What is a DVT?  -Deep vein thrombosis (DVT) is a condition in which a blood clot forms in a vein of the deep venous system which can occur in the lower leg, thigh, pelvis, arm, or neck. This condition is serious and can be life-threatening if the clot travels to the arteries of the lungs and causing a blockage (pulmonary embolism, PE). A DVT can also damage veins in the leg, which can lead to long-term venous disease, leg pain, swelling, discoloration, and ulcers or sores (post-thrombotic syndrome).  -Treatment may include taking an anticoagulant medication to prevent more clots from forming and the current clot from growing, wearing compression stockings, and/or surgical procedures to remove or dissolve the clot.

## 2022-08-23 ENCOUNTER — Ambulatory Visit: Payer: 59 | Attending: Family Medicine | Admitting: Physical Therapy

## 2022-08-23 ENCOUNTER — Encounter: Payer: Self-pay | Admitting: Physical Therapy

## 2022-08-23 ENCOUNTER — Encounter: Payer: Self-pay | Admitting: Internal Medicine

## 2022-08-23 DIAGNOSIS — R29810 Facial weakness: Secondary | ICD-10-CM | POA: Diagnosis not present

## 2022-08-23 DIAGNOSIS — G510 Bell's palsy: Secondary | ICD-10-CM | POA: Diagnosis not present

## 2022-08-23 NOTE — Therapy (Signed)
OUTPATIENT PHYSICAL THERAPY CERVICAL EVALUATION   Patient Name: [name] MRN: [id number] DOB:01-Oct-1975, 47 y.o., 47 y.o., male Today's Date: 08/23/2022  END OF SESSION:  PT End of Session - 08/23/22 1442     Visit Number 1    Date for PT Re-Evaluation 10/18/22    PT Start Time 1315    PT Stop Time 1350    PT Time Calculation (min) 35 min    Activity Tolerance Patient tolerated treatment well    Behavior During Therapy [facility] for tasks assessed/performed             Past Medical History:  Diagnosis Date   Hypertension 2003   dx age 5    Wears glasses    Past Surgical History:  Procedure Laterality Date   CHOLECYSTECTOMY     Patient Active Problem List   Diagnosis Date Noted   Acute deep vein thrombosis (DVT) of femoral vein of right lower extremity (HCC) 08/21/2022   Neck pain 08/14/2021   Prediabetes 01/28/2021   Vitamin D deficiency 01/28/2021   Pre-ulcerative corn or callous 02/01/2019   Disorder of left Achilles tendon 02/01/2019   Inguinal hernia of left side without obstruction or gangrene 12/08/2016   Former tobacco use 12/08/2016   Localized swelling, mass and lump, multiple sites 12/08/2016   Subcutaneous nodules 07/09/2016   Periodontitis 05/27/2015   Pain in left wrist 05/27/2015   HTN (hypertension) 05/12/2015    PCP: [name], MD  REFERRING PROVIDER: Hoy Register, MD  REFERRING DIAG: G51.0 (ICD-10-CM) - Bell's palsy   THERAPY DIAG:  Facial weakness  Rationale for Evaluation and Treatment: Rehabilitation  ONSET DATE: 08/07/22  SUBJECTIVE:                                                                                                                                                                                                         SUBJECTIVE STATEMENT: Patient reports that his facial weakness has improved. He can smile, but he cannot frown. He is able to close his R eye. His speech is sometimes slurred. He has developed a  blood clot in his R leg which is very painful.  PERTINENT HISTORY:  Per referring Dr note: Seen at the ED on 07/29/22 for Bell's palsy and sinusitis.  He has pain on the right side of his face 7/10 described as discomfort, he has right otalgia and he has difficulty eating and drinking.  Nasal polyp 2016 found on MRI- new MRI ordered  PAIN:  Are you having pain? He was having pain in R face, but  has not for 2-3 days. R lower leg with blood clot, very painful.  PRECAUTIONS: None  WEIGHT BEARING RESTRICTIONS: No   LIVING ENVIRONMENT: Lives with: lives with their family Lives in: House/apartment Stairs: no issues Has following equipment at home: None  OCCUPATION: Works at [employer], involves a lot of walking,   PLOF: Independent  PATIENT GOALS: He doesn't know what to expect. Return to normal  NEXT MD VISIT: unknown  OBJECTIVE:   DIAGNOSTIC FINDINGS:  MRI 2016- R nasal polyp-new MRI ordered  COGNITION: Overall cognitive status: Within functional limits for tasks assessed  SENSATION: R side of his face doesn't feel normal  POSTURE: WNL  PALPATION: R facial muscles with decreased tone compared  FACIAL MUSCULATURE STRENGTH: R facial muscles all appear activated, but weak, unable to fully move into any facial expression, including brow raise/furrow,smile/frown, pucker, suck in cheeks, puff out cheeks, grimace   TODAY'S TREATMENT:                                                                                                                              DATE:  08/23/22 Education   PATIENT EDUCATION:  Education details: POC, HEP Person educated: Patient Education method: Medical illustrator Education comprehension: verbalized understanding and returned demonstration  HOME EXERCISE PROGRAM: [id number]  ASSESSMENT:  CLINICAL IMPRESSION: Patient is a 47 y.o. who was seen today for physical therapy evaluation and treatment for R Bell's Palsy.  He reports that he  is experiencing some increased motor activation in his face, but it remains weaker than L side. He was having some pain, but this has resolved in the past 2 days. He also reports R lower leg blood clot. He is on Eliquis for the blood clot. He will benefit from PT to facilitate active control of R side of his face as well as massage and stretching as needed to  ensure midline alignment.  OBJECTIVE IMPAIRMENTS:  impaired eating, blinking, decreased facial expressions. .   ACTIVITY LIMITATIONS: locomotion level  PARTICIPATION LIMITATIONS: driving and occupation  PERSONAL FACTORS: Past/current experiences are also affecting patient's functional outcome.   REHAB POTENTIAL: Good  CLINICAL DECISION MAKING: Stable/uncomplicated  EVALUATION COMPLEXITY: Low   GOALS: Goals reviewed with patient? Yes  SHORT TERM GOALS: Target date: 09/08/22  I with initial HEP Baseline:  Goal status: INITIAL  LONG TERM GOALS: Target date: 10/18/22  I with final HEP Baseline:  Goal status: INITIAL  2.  Patient will demonstrate R facial activity equal to L when performing smile?frown. Baseline: R facial muscles activated, but weak. Goal status: INITIAL  3.  Patient will demonstrate R facial activity equal to L when performing eyebrow raise/furrow Baseline: R facial muscles activated, but weak. Goal status: INITIAL  4.  Patient will report complete resolution of spillage of food/drink out of R side of mouth. Baseline: Decreased frequency, but still occurring. Goal status: INITIAL  5.  Patient will grimace with equal activity noted in his neck R/L  Baseline: R facial muscles activated, but weak. Goal status: INITIAL PLAN:  PT FREQUENCY: 1-2x/week  PT DURATION: 8 weeks  PLANNED INTERVENTIONS: Therapeutic exercises, Therapeutic activity, Neuromuscular re-education, Patient/Family education, Self Care, Electrical stimulation, and Manual therapy  PLAN FOR NEXT SESSION: Update HEP   Iona Beard,  DPT 08/23/2022, 3:54 PM

## 2022-08-24 ENCOUNTER — Encounter: Payer: Self-pay | Admitting: Nurse Practitioner

## 2022-08-24 ENCOUNTER — Other Ambulatory Visit: Payer: Self-pay | Admitting: Nurse Practitioner

## 2022-08-24 ENCOUNTER — Telehealth (HOSPITAL_COMMUNITY): Payer: Self-pay | Admitting: Emergency Medicine

## 2022-08-24 NOTE — Telephone Encounter (Signed)
Reaching out to patient to offer assistance regarding upcoming cardiac imaging study; pt verbalizes understanding of appt date/time, parking situation and where to check in, pre-test NPO status and medications ordered, and verified current allergies; name and call back number provided for further questions should they arise Aizik Reh RN Navigator Cardiac Imaging [facility] [phone] office [phone] cell 

## 2022-08-25 ENCOUNTER — Telehealth: Payer: 59 | Admitting: Family Medicine

## 2022-08-25 ENCOUNTER — Encounter: Payer: 59 | Admitting: Family Medicine

## 2022-08-25 DIAGNOSIS — I824Z1 Acute embolism and thrombosis of unspecified deep veins of right distal lower extremity: Secondary | ICD-10-CM | POA: Diagnosis not present

## 2022-08-25 NOTE — Progress Notes (Signed)
Duplicate visit. Pt checked in twice. DWB

## 2022-08-25 NOTE — Progress Notes (Signed)
Virtual Visit Consent   [name], you are scheduled for a virtual visit with a [facility] provider today. Just as with appointments in the office, your consent must be obtained to participate. Your consent will be active for this visit and any virtual visit you may have with one of our providers in the next 365 days. If you have a MyChart account, a copy of this consent can be sent to you electronically.  As this is a virtual visit, video technology does not allow for your provider to perform a traditional examination. This may limit your provider's ability to fully assess your condition. If your provider identifies any concerns that need to be evaluated in person or the need to arrange testing (such as labs, EKG, etc.), we will make arrangements to do so. Although advances in technology are sophisticated, we cannot ensure that it will always work on either your end or our end. If the connection with a video visit is poor, the visit may have to be switched to a telephone visit. With either a video or telephone visit, we are not always able to ensure that we have a secure connection.  By engaging in this virtual visit, you consent to the provision of healthcare and authorize for your insurance to be billed (if applicable) for the services provided during this visit. Depending on your insurance coverage, you may receive a charge related to this service.  I need to obtain your verbal consent now. Are you willing to proceed with your visit today? [name] has provided verbal consent on 08/25/2022 for a virtual visit (video or telephone). Georgana Curio, FNP  Date: 08/25/2022 11:04 AM  Virtual Visit via Video Note   I, Georgana Curio, connected with  [name]  ([id number], 1975/08/23) on 08/25/22 at 12:30 PM EDT by a video-enabled telemedicine application and verified that I am speaking with the correct person using two identifiers.  Location: Patient: Virtual Visit Location Patient:  Home Provider: Virtual Visit Location Provider: Home Office   I discussed the limitations of evaluation and management by telemedicine and the availability of in person appointments. The patient expressed understanding and agreed to proceed.    History of Present Illness: [name] is a 47 y.o. who identifies as a male who was assigned male at birth, and is being seen today for DVT RLE being treated however pain is present and he does not feel he can work today. Denies chest pain, sob and fever. Marland Kitchen  HPI: HPI  Problems:  Patient Active Problem List   Diagnosis Date Noted   Acute deep vein thrombosis (DVT) of femoral vein of right lower extremity (HCC) 08/21/2022   Neck pain 08/14/2021   Prediabetes 01/28/2021   Vitamin D deficiency 01/28/2021   Pre-ulcerative corn or callous 02/01/2019   Disorder of left Achilles tendon 02/01/2019   Inguinal hernia of left side without obstruction or gangrene 12/08/2016   Former tobacco use 12/08/2016   Localized swelling, mass and lump, multiple sites 12/08/2016   Subcutaneous nodules 07/09/2016   Periodontitis 05/27/2015   Pain in left wrist 05/27/2015   HTN (hypertension) 05/12/2015    Allergies:   Medications:  Current Outpatient Medications:    amLODipine (NORVASC) 10 MG tablet, Take 1 tablet (10 mg total) by mouth daily., Disp: 90 tablet, Rfl: 1   apixaban (ELIQUIS) 5 MG TABS tablet, Take 1 tablet (5 mg total) by mouth 2 (two) times daily. Start taking after completion of starter pack., Disp:  60 tablet, Rfl: 5   APIXABAN (ELIQUIS) VTE STARTER PACK (10MG  AND 5MG ), Take as directed on package: start with two-5mg  tablets twice daily for 7 days. On day 8, switch to one-5mg  tablet twice daily., Disp: 74 each, Rfl: 0   BIOTIN PO, Take 1,000 mg by mouth daily., Disp: , Rfl:    carvedilol (COREG) 25 MG tablet, Take 1 tablet (25 mg total) by mouth 2 (two) times daily with a meal., Disp: 180 tablet, Rfl: 1   DULoxetine (CYMBALTA) 60 MG capsule,  Take 1 capsule (60 mg total) by mouth daily for neuropathic pain (Patient not taking: Reported on 08/13/2022), Disp: 30 capsule, Rfl: 3   hydroxypropyl methylcellulose / hypromellose (ISOPTO TEARS / GONIOVISC) 2.5 % ophthalmic solution, Place 1 drop into the right eye 4 (four) times daily as needed for dry eyes. (Patient not taking: Reported on 08/13/2022), Disp: 15 mL, Rfl: 1   losartan (COZAAR) 100 MG tablet, Take 1 tablet (100 mg total) by mouth daily., Disp: 90 tablet, Rfl: 1   methocarbamol (ROBAXIN) 500 MG tablet, Take 1 tablet (500 mg total) by mouth 2 (two) times daily as needed for muscle spasms., Disp: 30 tablet, Rfl: 0   metoprolol tartrate (LOPRESSOR) 50 MG tablet, Take 1 tablet by mouth 2 hours prior to CT scan (Patient not taking: Reported on 08/13/2022), Disp: 1 tablet, Rfl: 0   sildenafil (VIAGRA) 100 MG tablet, Take 0.5-1 tablets (50-100 mg total) by mouth daily as needed for erectile dysfunction. (Patient not taking: Reported on 08/13/2022), Disp: 10 tablet, Rfl: 11   spironolactone (ALDACTONE) 50 MG tablet, Take 1 tablet (50 mg total) by mouth daily., Disp: 90 tablet, Rfl: 1   Vitamin D, Ergocalciferol, (DRISDOL) 1.25 MG (50000 UNIT) CAPS capsule, Take 1 capsule (50,000 Units total) by mouth every 7 (seven) days., Disp: 12 capsule, Rfl: 1  Observations/Objective: Patient is well-developed, well-nourished in no acute distress.  Resting comfortably  at home.  Head is normocephalic, atraumatic.  No labored breathing.  Speech is clear and coherent with logical content.  Patient is alert and oriented at baseline.    Assessment and Plan: 1. Acute deep vein thrombosis (DVT) of distal vein of right lower extremity (HCC)  OOW today, continue to follow with pcp and treat as ordered, UC if sx worsen.   Follow Up Instructions: I discussed the assessment and treatment plan with the patient. The patient was provided an opportunity to ask questions and all were answered. The patient agreed with  the plan and demonstrated an understanding of the instructions.  A copy of instructions were sent to the patient via MyChart unless otherwise noted below.     The patient was advised to call back or seek an in-person evaluation if the symptoms worsen or if the condition fails to improve as anticipated.  Time:  I spent 10 minutes with the patient via telehealth technology discussing the above problems/concerns.    Georgana Curio, FNP

## 2022-08-25 NOTE — Patient Instructions (Signed)
Deep Vein Thrombosis  Deep vein thrombosis (DVT) is a condition in which a blood clot forms in a vein of the deep venous system. This can occur in the lower leg, thigh, pelvis, arm, or neck. A clot is blood that has thickened into a gel or solid. This condition is serious and can be life-threatening if the clot travels to the arteries of the lungs and causes a blockage (pulmonary embolism). A DVT can also damage veins in the leg, which can lead to long-term venous disease, leg pain, swelling, discoloration, and ulcers or sores (post-thrombotic syndrome). What are the causes? This condition may be caused by: A slowdown of blood flow. Damage to a vein. A condition that causes blood to clot more easily, such as certain bleeding disorders. What increases the risk? The following factors may make you more likely to develop this condition: Obesity. Being older, especially older than age 60. Being inactive or not moving around (sedentary lifestyle). This may include: Sitting or lying down for longer than 4-6 hours other than to sleep at night. Being in the hospital, or having major or lengthy surgery. Having any recent bone injuries, such as breaks (fractures), that reduce movement, especially in the lower extremities. Having recent orthopedic surgery on the lower extremities. Being pregnant, giving birth, or having recently given birth. Taking medicines that contain estrogen, such as birth control or hormone replacement therapy. Using products that contain nicotine or tobacco, especially if you use hormonal birth control. Having a history of a blood vessel disease (peripheral vascular disease) or congestive heart disease. Having a history of cancer, especially if being treated with chemotherapy. What are the signs or symptoms? Symptoms of this condition include: Swelling, pain, pressure, or tenderness in an arm or a leg. An arm or a leg becoming warm, red, or discolored. A leg turning very pale or  blue. You may have a large DVT. This is rare. If the clot is in your leg, you may notice that symptoms get worse when you stand or walk. In some cases, there are no symptoms. How is this diagnosed? This condition is diagnosed with: Your medical history and a physical exam. Tests, such as: Blood tests to check how well your blood clots. Doppler ultrasound. This is the best way to find a DVT. CT venogram. Contrast dye is injected into a vein, and X-rays are taken to check for clots. This is helpful for veins in the chest or pelvis. How is this treated? Treatment for this condition depends on: The cause of your DVT. The size and location of your DVT, or having more than one DVT. Your risk for bleeding or developing more clots. Other medical conditions you may have. Treatment may include: Taking a blood thinner medicine (anticoagulant) to prevent more clots from forming or current clots from growing. Wearing compression stockings. Injecting medicines into the affected vein to break up the clot (catheter-directed thrombolysis). Surgical procedures, when DVT is severe or hard to treat. These may be done to: Isolate and remove your clot. Place an inferior vena cava (IVC) filter. This filter is placed into a large vein called the inferior vena cava to catch blood clots before they reach your lungs. You may get some medical treatments for 6 months or longer. Follow these instructions at home: If you are taking blood thinners: Talk with your health care provider before you take any medicines that contain aspirin or NSAIDs, such as ibuprofen. These medicines increase your risk for dangerous bleeding. Take your medicine exactly   as told, at the same time every day. Do not skip a dose. Do not take more than the prescribed dose. This is important. Ask your health care provider about foods and medicines that could change or interact with the way your blood thinner works. Avoid these foods and medicines  if you are told to do so. Avoid anything that may cause bleeding or bruising. You may bleed more easily while taking blood thinners. Be very careful when using knives, scissors, or other sharp objects. Use an electric razor instead of a blade. Avoid activities that could cause injury or bruising, and follow instructions for preventing falls. Tell your health care provider if you have had any internal bleeding, bleeding ulcers, or neurologic diseases, such as strokes or cerebral aneurysms. Wear a medical alert bracelet or carry a card that lists what medicines you take. General instructions Take over-the-counter and prescription medicines only as told by your health care provider. Return to your normal activities as told by your health care provider. Ask your health care provider what activities are safe for you. If recommended, wear compression stockings as told by your health care provider. These stockings help to prevent blood clots and reduce swelling in your legs. Never wear your compression stockings while sleeping at night. Keep all follow-up visits. This is important. Where to find more information American Heart Association: www.heart.org Centers for Disease Control and Prevention: www.cdc.gov National Heart, Lung, and Blood Institute: www.nhlbi.nih.gov Contact a health care provider if: You miss a dose of your blood thinner. You have unusual bruising or other color changes. You have new or worse pain, swelling, or redness in an arm or a leg. You have worsening numbness or tingling in an arm or a leg. You have a significant color change (pale or blue) in the extremity that has the DVT. Get help right away if: You have signs or symptoms that a blood clot has moved to the lungs. These may include: Shortness of breath. Chest pain. Fast or irregular heartbeats (palpitations). Light-headedness, dizziness, or fainting. Coughing up blood. You have signs or symptoms that your blood is  too thin. These may include: Blood in your vomit, stool, or urine. A cut that will not stop bleeding. A menstrual period that is heavier than usual. A severe headache or confusion. These symptoms may be an emergency. Get help right away. Call 911. Do not wait to see if the symptoms will go away. Do not drive yourself to the hospital. Summary Deep vein thrombosis (DVT) happens when a blood clot forms in a deep vein. This may occur in the lower leg, thigh, pelvis, arm, or neck. Symptoms affect the arm or leg and can include swelling, pain, tenderness, warmth, redness, or discoloration. This condition may be treated with medicines. In severe cases, a procedure or surgery may be done to remove or dissolve the clots. If you are taking blood thinners, take them exactly as told. Do not skip a dose. Do not take more than is prescribed. Get help right away if you have a severe headache, shortness of breath, chest pain, fast or irregular heartbeats, or blood in your vomit, urine, or stool. This information is not intended to replace advice given to you by your health care provider. Make sure you discuss any questions you have with your health care provider. Document Revised: 09/19/2020 Document Reviewed: 09/19/2020 Elsevier Patient Education  2024 Elsevier Inc.  

## 2022-08-27 ENCOUNTER — Other Ambulatory Visit: Payer: Self-pay

## 2022-08-27 ENCOUNTER — Encounter: Payer: Self-pay | Admitting: Internal Medicine

## 2022-08-27 ENCOUNTER — Ambulatory Visit (HOSPITAL_COMMUNITY)
Admission: RE | Admit: 2022-08-27 | Discharge: 2022-08-27 | Disposition: A | Discharge status: Home / Self Care | Payer: 59 | Source: Ambulatory Visit | Attending: Internal Medicine | Admitting: Internal Medicine

## 2022-08-27 ENCOUNTER — Other Ambulatory Visit: Payer: Self-pay | Admitting: Nurse Practitioner

## 2022-08-27 ENCOUNTER — Telehealth: Payer: Self-pay | Admitting: Neurology

## 2022-08-27 DIAGNOSIS — R072 Precordial pain: Secondary | ICD-10-CM

## 2022-08-27 DIAGNOSIS — R079 Chest pain, unspecified: Secondary | ICD-10-CM | POA: Diagnosis present

## 2022-08-27 DIAGNOSIS — J3489 Other specified disorders of nose and nasal sinuses: Secondary | ICD-10-CM

## 2022-08-27 DIAGNOSIS — E041 Nontoxic single thyroid nodule: Secondary | ICD-10-CM

## 2022-08-27 DIAGNOSIS — G510 Bell's palsy: Secondary | ICD-10-CM

## 2022-08-27 MED ORDER — NITROGLYCERIN 0.4 MG SL SUBL
SUBLINGUAL_TABLET | SUBLINGUAL | Status: AC
  Filled 2022-08-27: qty 2

## 2022-08-27 MED ORDER — IOHEXOL 350 MG/ML SOLN
100.0000 mL | Freq: Once | INTRAVENOUS | Status: AC | PRN
  Administered 2022-08-27: 100 mL via INTRAVENOUS

## 2022-08-27 MED ORDER — NITROGLYCERIN 0.4 MG SL SUBL
0.8000 mg | SUBLINGUAL_TABLET | Freq: Once | SUBLINGUAL | Status: AC
  Administered 2022-08-27: 0.8 mg via SUBLINGUAL

## 2022-08-27 NOTE — Telephone Encounter (Signed)
[name] form the reading room at [facility] called and LVM to speak to someone regarding pt's CT Maxillofacial results. Please advise.

## 2022-08-27 NOTE — Telephone Encounter (Signed)
Pt called back. Stated someone just called him from the office. Pt is requesting a call back.

## 2022-08-27 NOTE — Telephone Encounter (Signed)
Per Dr. Lucia Gaskins:  "Can you try and get him in to Dr. 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 Orbit and nasal spetum and ethmoid cells.  Also his thyroid lobe needs further evaluation, I will have to ask Jonah Blue to take care of this.  Can you call patient and let him know please? ttjanks "  I called patient. I discussed this with him. He does not have an established ENT. He will follow up with Dr. Laural Benes regarding his thyroid. He is aware we will attempt to get him in to see an ENT ASAP. Pt verbalized understanding.  I called Dr. Avel Sensor office. They don't accept patient's insurance.  I called [facility] @ [phone]. Was forced to leave a VM with the referrals coordination.  I will ask our referrals team to take over from here and get the patient in ASAP to an ENT office locally.

## 2022-08-27 NOTE — Progress Notes (Signed)
IV Started 20g L AC Attempt x1 Pt tolerated well  Post-procedure vitals HR 62 BP 108/62  IV removed Catheter intact Bleeding controlled  Pt reports mild headache and lightheadedness during scan, denies any dizziness or lightheadedness during positional changes.  Educated pt on medication administered and follow up care.  Pt verbalized understanding.  Able to ambulate without assist.  Pt arrived to appt alone, transported to H&V valet area.  Ready to DC.

## 2022-08-27 NOTE — Telephone Encounter (Signed)
I called [facility] Reading Room, spoke with French Ana. They want Dr. Lucia Gaskins to review the CT maxillofacial results from 08/15/22 urgently, specifically points 1 & 4 on the impression.

## 2022-08-27 NOTE — Addendum Note (Signed)
Addended by: Geronimo Running A on: 08/27/2022 01:42 PM   Modules accepted: Orders

## 2022-08-27 NOTE — Telephone Encounter (Signed)
Referral faxed to [facility]: Phone: [phone]  Fax: [phone]

## 2022-08-28 NOTE — Telephone Encounter (Signed)
Referral faxed to [facility]: Phone [phone]  Fax: [phone] Pt scheduled for 6/20 @ 1:30 pm

## 2022-08-29 NOTE — Progress Notes (Signed)
Scheduled a US thyroid appointment for 09/03/2022 at 2:30 p.m. Called & spoke to the patient. Verified name & DOB. Informed of upcoming appointment at [facility]. Informed patient to arrive 15 minutes early and go through entrance "A". Patient expressed verbal understanding. No further questions at this time.

## 2022-08-30 ENCOUNTER — Telehealth: Payer: Self-pay | Admitting: Internal Medicine

## 2022-08-30 ENCOUNTER — Ambulatory Visit: Payer: 59 | Admitting: Physical Therapy

## 2022-08-30 NOTE — Telephone Encounter (Signed)
Patient called to request his work note be updated to clarify that his return to work date should read June 25 instead of "one week" from June 17.  Patient stated can call him or post note to his MyChart

## 2022-08-30 NOTE — Telephone Encounter (Signed)
Patient states his work not states to return to work in 1 week, but the job needs the note to say "return June 25" Patient has to have this prior to the 25th.

## 2022-08-31 NOTE — Telephone Encounter (Signed)
Thyroid Ultrasound has been ordered by NP

## 2022-09-03 ENCOUNTER — Telehealth: Payer: Self-pay | Admitting: Internal Medicine

## 2022-09-03 ENCOUNTER — Telehealth (HOSPITAL_COMMUNITY): Payer: Self-pay | Admitting: Student-PharmD

## 2022-09-03 ENCOUNTER — Other Ambulatory Visit: Payer: Self-pay

## 2022-09-03 ENCOUNTER — Ambulatory Visit: Payer: Self-pay

## 2022-09-03 ENCOUNTER — Ambulatory Visit (HOSPITAL_COMMUNITY)
Admission: RE | Admit: 2022-09-03 | Discharge: 2022-09-03 | Disposition: A | Discharge status: Home / Self Care | Payer: 59 | Source: Ambulatory Visit | Attending: Nurse Practitioner | Admitting: Nurse Practitioner

## 2022-09-03 DIAGNOSIS — E041 Nontoxic single thyroid nodule: Secondary | ICD-10-CM | POA: Insufficient documentation

## 2022-09-03 DIAGNOSIS — E042 Nontoxic multinodular goiter: Secondary | ICD-10-CM | POA: Diagnosis not present

## 2022-09-03 NOTE — Telephone Encounter (Signed)
Pt was returning LPN call and is requesting a callback. Please advise  

## 2022-09-03 NOTE — Telephone Encounter (Signed)
Attempt to call the pt, unable to leave VM message . 

## 2022-09-03 NOTE — Telephone Encounter (Signed)
Chief Complaint: Right Thigh swelling Symptoms: pain 10/10 Frequency: Yesterday morning Pertinent Negatives: Patient denies SOB, CP Disposition: [x] ED /[] Urgent Care (no appt availability in office) / [] Appointment(In office/virtual)/ []  [place] [facility]/ [] Home Care/ [x] Refused Recommended Disposition /[] [place] [facility]/ []  Follow-up with PCP Additional Notes: Patient says he has a blood clot in the right lower leg and that he just received word from Dr. Wyline Mood, cardiologist, that the clot is now in his lung. He says he thinks the blood clot has broken off and is now in his thigh because it's swollen and painful, hard to bear weight on the leg. I advised ED for evaluation due to no availability in the office and he will need an evaluation. He says nurse told him from the infusion clinic that he will need to call PCP to have an ultrasound ordered per the vascular doctor. He says that's why he's calling. He says he has a thyroid ultrasound at 1430 today. He says he sent a MyChart message to Dr. Laural Benes and Bertram Denver replied today about a work note and being out of work. He says he doesn't want to lose his job and that they will need to know what is going on with him. Advised I will send this to Dr. Laural Benes and someone will call back with her recommendation regarding the swelling to the thigh and if she will send order for ultrasound without being seen by a provider. He verbalized understanding. CB [phone].   Reason for Disposition  [1] Thigh or calf pain AND [2] only 1 side AND [3] present > 1 hour  Answer Assessment - Initial Assessment Questions 1. ONSET: "When did the swelling start?" (e.g., minutes, hours, days)     Yesterday morning 2. LOCATION: "What part of the leg is swollen?"  "Are both legs swollen or just one leg?"     Right thigh  3. SEVERITY: "How bad is the swelling?" (e.g., localized; mild, moderate, severe)   - Localized: Small area of swelling localized to one  leg.   - MILD pedal edema: Swelling limited to foot and ankle, pitting edema < 1/4 inch (6 mm) deep, rest and elevation eliminate most or all swelling.   - MODERATE edema: Swelling of lower leg to knee, pitting edema > 1/4 inch (6 mm) deep, rest and elevation only partially reduce swelling.   - SEVERE edema: Swelling extends above knee, facial or hand swelling present.      Severe 4. REDNESS: "Does the swelling look red or infected?"     No 5. PAIN: "Is the swelling painful to touch?" If Yes, ask: "How painful is it?"   (Scale 1-10; mild, moderate or severe)     10 7. CAUSE: "What do you think is causing the leg swelling?"     Possible blood clot has moved from lower to upper leg 8. MEDICAL HISTORY: "Do you have a history of blood clots (e.g., DVT), cancer, heart failure, kidney disease, or liver failure?"     Currently have a clot 9. OTHER SYMPTOMS: "Do you have any other symptoms?" (e.g., chest pain, difficulty breathing)       No  Protocols used: Leg Swelling and Edema-A-AH

## 2022-09-03 NOTE — Telephone Encounter (Signed)
Multiple encounters open.  Closing this encounter

## 2022-09-03 NOTE — Telephone Encounter (Signed)
Patient called explaining that he was found to have a PE on his cardiac CT after his last visit in the [facility]. His cardiologist Dr. Wyline Mood continued him on Eliquis. Today, he tells me that he is worried the clot may have moved up his leg too as he is having some pain and swelling above the knee. I spoke with Dr. Myra Gianotti of vascular surgery who recommended his PCP order another [place] to first rule out any clot propagation. Relayed this message to the patient who said he would call his PCP.

## 2022-09-03 NOTE — Telephone Encounter (Signed)
Spoke to the pt, he is experiencing pain in right thigh, right LE swelling and right back pain. Pt was advised by Pervis Hocking RPH-CPP: I spoke with Dr. Myra Gianotti of vascular surgery who recommended his PCP order another [place] to first rule out any clot propagation. Relayed this message to the patient who said he would call his PCP.   Pt stated is scheduled appointment  with PCP on 09/11/22,this afternoon pt  is scheduled for Thyroid US. Pt stated PCP office is trying to add order for another Venous US after his Thyroid US. Pt stated he did receive the return to work letter, however, his employer will like dates on the return to work letter. Pt stated he is not ready to return to work. Advised pt will forward his concerns to the provider. He does not have voicemail setup on his cell phone,however, he will call the office after his [place]. Will forward to MD and nurse for advise.

## 2022-09-03 NOTE — Telephone Encounter (Signed)
Patient is calling discuss return to work letter. He states that he was told he can return back to work tomorrow 06/25, but he believes he is not ready for that due to blood clot he believes clot has not passed and would like to speak with someone in regards to this. Will be available after 3:30 P.M. today.

## 2022-09-04 ENCOUNTER — Ambulatory Visit: Payer: 59 | Attending: Internal Medicine | Admitting: Internal Medicine

## 2022-09-04 ENCOUNTER — Other Ambulatory Visit: Payer: Self-pay | Admitting: Nurse Practitioner

## 2022-09-04 ENCOUNTER — Encounter: Payer: Self-pay | Admitting: Internal Medicine

## 2022-09-04 VITALS — BP 133/86 | HR 77 | Ht 74.0 in | Wt 211.0 lb

## 2022-09-04 DIAGNOSIS — E041 Nontoxic single thyroid nodule: Secondary | ICD-10-CM

## 2022-09-04 DIAGNOSIS — E042 Nontoxic multinodular goiter: Secondary | ICD-10-CM | POA: Diagnosis not present

## 2022-09-04 DIAGNOSIS — J392 Other diseases of pharynx: Secondary | ICD-10-CM

## 2022-09-04 DIAGNOSIS — R7989 Other specified abnormal findings of blood chemistry: Secondary | ICD-10-CM

## 2022-09-04 DIAGNOSIS — I82411 Acute embolism and thrombosis of right femoral vein: Secondary | ICD-10-CM | POA: Diagnosis not present

## 2022-09-04 DIAGNOSIS — I2699 Other pulmonary embolism without acute cor pulmonale: Secondary | ICD-10-CM | POA: Diagnosis not present

## 2022-09-04 NOTE — Telephone Encounter (Signed)
Letter drafted. Will wait until Dr.Branch returns office to sign.

## 2022-09-04 NOTE — Telephone Encounter (Signed)
Patient scheduled for a 2:10 appointment on 09/04/2022.

## 2022-09-04 NOTE — Progress Notes (Unsigned)
Patient ID: LAYN KYE, male    DOB: [birth year]  MRN: [id number]  CC: Leg Swelling (R thigh swelling X+2days /Requesting work note with a specific out date /Intermittent issues with nose /Bump under L ear )   Subjective: Karas Pickerill is a 47 y.o. male who presents for chronic ds management His concerns today include:  Hx of poorly controlled  HTN, preDM, tob dep, poor dentition, chronic lower back pain, history of reflex syncope, callus of left Achilles tendon.   Patient has had several significant medical events occur since I last saw him.  Developed Bell's palsy on the right side in May. Subsequently developed acute DVT in the right leg.  Presented to the ER with pain in the right calf.  Doppler showed findings consistent with occlusive thrombus within the distal right femoral vein, popliteal, peroneal and posterior tibial vein.  Started on Eliquis 08/18/2022.  No initiating factors like trauma to the leg, recent long distance travel.  No family history or personal history of DVT.  Just completed the Eliquis loading dose of 10 mg twice a day.  Now on 5 mg twice a day.  Had coronary CT through cardiology for workup of chest pains.  This revealed extensive pulmonary embolism on the right side.  Denies any shortness of breath with ambulation or at rest except 1 time.  Has appointment with heme-onc next month. Yesterday he had called reporting some swelling in the right thigh.  Patient states that swelling overall in the leg is significantly better since being diagnosed and placed on Eliquis.  However yesterday he felt like the posterior right thigh was swollen.  Was told by vascular to get a repeat Doppler to see if there is extensive progression.  Reports pain in the leg when he does a lot of standing.  He has been out of work.  Requests note to remain out of work until July 1.  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  Saw ENT Dr. Jenne Pane last week and had  biopsy done.  Results pending. He notes a lot of drainage from the right nostril and at the back of the throat.  Mucus is slightly blood-tinged since the biopsy.  Eliquis was not held for the biopsy.  Incidental finding of right thyroid nodules on maxillofacial CT that was done 08/15/2022.  Thyroid ultrasound done yesterday showed 7 nodules consistent with multinodular goiter.  No problems swallowing. Patient Active Problem List   Diagnosis Date Noted   Acute deep vein thrombosis (DVT) of femoral vein of right lower extremity (HCC) 08/21/2022   Neck pain 08/14/2021   Prediabetes 01/28/2021   Vitamin D deficiency 01/28/2021   Pre-ulcerative corn or callous 02/01/2019   Disorder of left Achilles tendon 02/01/2019   Inguinal hernia of left side without obstruction or gangrene 12/08/2016   Former tobacco use 12/08/2016   Localized swelling, mass and lump, multiple sites 12/08/2016   Subcutaneous nodules 07/09/2016   Periodontitis 05/27/2015   Pain in left wrist 05/27/2015   HTN (hypertension) 05/12/2015     Current Outpatient Medications on File Prior to Visit  Medication Sig Dispense Refill   amLODipine (NORVASC) 10 MG tablet Take 1 tablet (10 mg total) by mouth daily. 90 tablet 1   apixaban (ELIQUIS) 5 MG TABS tablet Take 1 tablet (5 mg total) by mouth 2 (two) times daily. Start taking after completion of starter pack. 60 tablet 5   APIXABAN (ELIQUIS) VTE STARTER PACK (10MG  AND 5MG ) Take  as directed on package: start with two-5mg  tablets twice daily for 7 days. On day 8, switch to one-5mg  tablet twice daily. 74 each 0   BIOTIN PO Take 1,000 mg by mouth daily.     carvedilol (COREG) 25 MG tablet Take 1 tablet (25 mg total) by mouth 2 (two) times daily with a meal. 180 tablet 1   losartan (COZAAR) 100 MG tablet Take 1 tablet (100 mg total) by mouth daily. 90 tablet 1   methocarbamol (ROBAXIN) 500 MG tablet Take 1 tablet (500 mg total) by mouth 2 (two) times daily as needed for muscle spasms.  30 tablet 0   sildenafil (VIAGRA) 100 MG tablet Take 0.5-1 tablets (50-100 mg total) by mouth daily as needed for erectile dysfunction. 10 tablet 11   spironolactone (ALDACTONE) 50 MG tablet Take 1 tablet (50 mg total) by mouth daily. 90 tablet 1   Vitamin D, Ergocalciferol, (DRISDOL) 1.25 MG (50000 UNIT) CAPS capsule Take 1 capsule (50,000 Units total) by mouth every 7 (seven) days. 12 capsule 1   DULoxetine (CYMBALTA) 60 MG capsule Take 1 capsule (60 mg total) by mouth daily for neuropathic pain (Patient not taking: Reported on 08/13/2022) 30 capsule 3   hydroxypropyl methylcellulose / hypromellose (ISOPTO TEARS / GONIOVISC) 2.5 % ophthalmic solution Place 1 drop into the right eye 4 (four) times daily as needed for dry eyes. (Patient not taking: Reported on 08/13/2022) 15 mL 1   metoprolol tartrate (LOPRESSOR) 50 MG tablet Take 1 tablet by mouth 2 hours prior to CT scan (Patient not taking: Reported on 08/13/2022) 1 tablet 0   No current facility-administered medications on file prior to visit.    Allergies  Allergen Reactions   Doxycycline Other (See Comments)    Possible allergen. Developed Bell's Palsy the day after taking antibiotic.   Penicillins Swelling and Rash    Has patient had a PCN reaction causing immediate rash, facial/tongue/throat swelling, SOB or lightheadedness with hypotension: YesYes Has patient had a PCN reaction causing severe rash involving mucus membranes or skin necrosis: NoNo Has patient had a PCN reaction that required hospitalization YesYes Has patient had a PCN reaction occurring within the last 10 years: NoNo If all of the above answers are "NO", then may proceed with Cephalospori    Social History   Socioeconomic History   Marital status: Single    Spouse name: Not on file   Number of children: Not on file   Years of education: Not on file   Highest education level: Not on file  Occupational History   Not on file  Tobacco Use   Smoking status: Every Day     Packs/day: 1    Types: Cigars, Cigarettes    Last attempt to quit: 05/10/2015    Years since quitting: 7.3   Smokeless tobacco: Never  Vaping Use   Vaping Use: Never used  Substance and Sexual Activity   Alcohol use: Yes    Comment: occ   Drug use: Yes    Types: Marijuana   Sexual activity: Not on file  Other Topics Concern   Not on file  Social History Narrative   Not on file   Social Determinants of Health   Financial Resource Strain: Low Risk  (05/31/2022)   Overall Financial Resource Strain (CARDIA)    Difficulty of Paying Living Expenses: Not very hard  Food Insecurity: No Food Insecurity (05/31/2022)   Hunger Vital Sign    Worried About Running Out of Food in the Last  Year: Never true    Ran Out of Food in the Last Year: Never true  Transportation Needs: No Transportation Needs (05/31/2022)   PRAPARE - Administrator, Civil Service (Medical): No    Lack of Transportation (Non-Medical): No  Physical Activity: Unknown (08/07/2022)   Exercise Vital Sign    Days of Exercise per Week: 5 days    Minutes of Exercise per Session: Patient declined  Recent Concern: Physical Activity - Inactive (05/31/2022)   Exercise Vital Sign    Days of Exercise per Week: 0 days    Minutes of Exercise per Session: 0 min  Stress: No Stress Concern Present (08/07/2022)   [employer] of Occupational Health - Occupational Stress Questionnaire    Feeling of Stress : Only a little  Social Connections: Unknown (08/07/2022)   Social Connection and Isolation Panel [NHANES]    Frequency of Communication with Friends and Family: Twice a week    Frequency of Social Gatherings with Friends and Family: Twice a week    Attends Religious Services: Never    Database administrator or Organizations: No    Attends Engineer, structural: Never    Marital Status: Patient declined  Recent Concern: Social Connections - Moderately Isolated (05/31/2022)   Social Connection and Isolation  Panel [NHANES]    Frequency of Communication with Friends and Family: Twice a week    Frequency of Social Gatherings with Friends and Family: Twice a week    Attends Religious Services: Never    Database administrator or Organizations: No    Attends Banker Meetings: Never    Marital Status: Living with partner  Intimate Partner Violence: Not At Risk (05/31/2022)   Humiliation, Afraid, Rape, and Kick questionnaire    Fear of Current or Ex-Partner: No    Emotionally Abused: No    Physically Abused: No    Sexually Abused: No    Family History  Problem Relation Age of Onset   Cancer Mother     Past Surgical History:  Procedure Laterality Date   CHOLECYSTECTOMY      ROS: Review of Systems Negative except as stated above  PHYSICAL EXAM: BP 133/86 (BP Location: Left Arm, Patient Position: Sitting, Cuff Size: Normal)   Pulse 77   Ht 6\' 2"  (1.88 m)   Wt 211 lb (95.7 kg)   SpO2 100%   BMI 27.09 kg/m   Physical Exam   General appearance - alert, well appearing, middle-age African-American male and in no distress Mental status - normal mood, behavior, speech, dress, motor activity, and thought processes Ears -moderate soft wax in the right ear.  Left ear canal and tympanic membrane within normal limits.  He has what feels like a tiny lymph node right below the left pinna Nose -moderate enlargement of nasal turbinate on the right side. Mouth -slight drainage noted at the back of the throat. Neck -thyroid gland mildly enlarged diffusely Chest - clear to auscultation, no wheezes, rales or rhonchi, symmetric air entry Heart - normal rate, regular rhythm, normal S1, S2, no murmurs, rubs, clicks or gallops Extremities -patient with varicose vein posterior aspect of both calf.  Right leg does not appear swollen compared to the left.  Right thigh also does not appear swollen compared to the left.     Latest Ref Rng & Units 08/18/2022    9:26 PM 08/13/2022   11:30 AM  04/06/2022    9:13 AM  CMP  Glucose 70 -  99 mg/dL 161  096  045   BUN 6 - 20 mg/dL 15  12  14    Creatinine 0.61 - 1.24 mg/dL 4.09  8.11  9.14   Sodium 135 - 145 mmol/L 135  139  135   Potassium 3.5 - 5.1 mmol/L 4.3  4.4  4.1   Chloride 98 - 111 mmol/L 99  100  101   CO2 22 - 32 mmol/L 28  24  25    Calcium 8.9 - 10.3 mg/dL 8.7  9.2  9.1    Lipid Panel     Component Value Date/Time   CHOL 129 10/19/2020 1136   TRIG 114 10/19/2020 1136   HDL 39 (L) 10/19/2020 1136   CHOLHDL 3.3 10/19/2020 1136   LDLCALC 69 10/19/2020 1136    CBC    Component Value Date/Time   WBC 7.3 08/18/2022 2126   RBC 5.09 08/18/2022 2126   HGB 14.9 08/18/2022 2126   HGB 17.4 04/05/2022 1010   HCT 43.0 08/18/2022 2126   HCT 51.9 (H) 04/05/2022 1010   PLT 139 (L) 08/18/2022 2126   PLT 285 04/05/2022 1010   MCV 84.5 08/18/2022 2126   MCV 86 04/05/2022 1010   MCH 29.3 08/18/2022 2126   MCHC 34.7 08/18/2022 2126   RDW 14.1 08/18/2022 2126   RDW 12.9 04/05/2022 1010   LYMPHSABS 3.2 08/18/2022 2126   LYMPHSABS 2.7 04/05/2022 1010   MONOABS 0.5 08/18/2022 2126   EOSABS 0.2 08/18/2022 2126   EOSABS 0.1 04/05/2022 1010   BASOSABS 0.0 08/18/2022 2126   BASOSABS 0.0 04/05/2022 1010    ASSESSMENT AND PLAN: 1. Acute deep vein thrombosis (DVT) of femoral vein of right lower extremity (HCC) 2. Other acute pulmonary embolism, unspecified whether acute cor pulmonale present (HCC) -pt with no known predisposing factors based on hx at this time.  However, I informed him that if the nasopharyngeal mass turns out to be cancerous lesion, then this in and of itself is a predisposing factor.  Results of hypercoag work up would like be affected by him being on Eliquis so will hold off on ordering at this time.  Keep appt with Dr. Bertis Ruddy next mth. Advise to report any rectal bleeding, hematuria etc.  He currently reports some blood tinged mucos from Rt nostril since biopsy.  Will observe for now -reports swelling in RT leg  significantly decreased and leg does not appear swollen today. Doubt repeat US will change management but pt would like to have done since vascular recommended. Will hold off on prescribing compression stocking. Try to keep leg elevated Letter given keeping him out of work until 09/10/2022 - VAS [place] LOWER EXTREMITY VENOUS (DVT); Future  3. Nasopharyngeal mass Await biopsy results from Dr. Jenne Pane.   4. Multiple thyroid nodules Check thyroid function test. If not hyperthyroid range, will need FNA biopsy of nodules that met criteria.  I would need to find out from radiologist if Eliquis would need to be held; if so, I would prefer having him on anticoag continuously for at least 2 mths before holding it.  Would also like to know results of bx of nasopharyngeal mass first as well as this will take precedence. - NWG+N5A+[id number]  Patient was given the opportunity to ask questions.  Patient verbalized understanding of the plan and was able to repeat key elements of the plan.   Addendum 09/06/2022:  Addendum:  thyroid function test came back with slightly low TSH but nl free T3/T4.  I got advise from  one of our endocrinologist who recommended getting thyroid scan first to determine if hot vs cold nodules. Will inform pt of this and have my CMA schedule study.  This documentation was completed using Paediatric nurse.  Any transcriptional errors are unintentional.  Orders Placed This Encounter  Procedures   TSH+T4F+T3Free   VAS [place] LOWER EXTREMITY VENOUS (DVT)     Requested Prescriptions    No prescriptions requested or ordered in this encounter    No follow-ups on file.  Jonah Blue, MD, FACP

## 2022-09-05 LAB — TSH+T4F+T3FREE
Free T4: 1.04 ng/dL (ref 0.82–1.77)
T3, Free: 3.8 pg/mL (ref 2.0–4.4)
TSH: 0.335 u[IU]/mL — ABNORMAL LOW (ref 0.450–4.500)

## 2022-09-06 ENCOUNTER — Ambulatory Visit: Payer: 59 | Admitting: Physical Therapy

## 2022-09-06 ENCOUNTER — Encounter: Payer: Self-pay | Admitting: Internal Medicine

## 2022-09-06 ENCOUNTER — Telehealth: Payer: Self-pay | Admitting: Internal Medicine

## 2022-09-06 ENCOUNTER — Other Ambulatory Visit: Payer: Self-pay

## 2022-09-06 NOTE — Telephone Encounter (Signed)
Thyroid uptake scan scheduled for 09/14/2022 at 8:00 a.m. Informed patient to arrive 15 minutes before and to have nothing to eat or drink 6 hours before the procedure. Arrive at entrance A. Patient expressed verbal understanding of all discussed.

## 2022-09-07 ENCOUNTER — Ambulatory Visit (HOSPITAL_COMMUNITY)
Admission: RE | Admit: 2022-09-07 | Discharge: 2022-09-07 | Disposition: A | Discharge status: Home / Self Care | Payer: 59 | Source: Ambulatory Visit | Attending: Internal Medicine | Admitting: Internal Medicine

## 2022-09-07 ENCOUNTER — Encounter: Payer: Self-pay | Admitting: Internal Medicine

## 2022-09-07 ENCOUNTER — Telehealth: Payer: Self-pay

## 2022-09-07 DIAGNOSIS — I82411 Acute embolism and thrombosis of right femoral vein: Secondary | ICD-10-CM | POA: Diagnosis not present

## 2022-09-07 NOTE — Telephone Encounter (Signed)
Copied from CRM [id number]. Topic: General - Other >> Sep 07, 2022  3:45 PM Epimenio Foot F wrote: Reason for CRM: Centralized scheduling for [facility] is calling in because they want to clarify the orders they received. Georgette Shell wants to know if Dr. Laural Benes wanted the single uptake or the multiple uptake for pt's appointment on July 5. [phone]

## 2022-09-07 NOTE — Progress Notes (Signed)
Right lower extremity venous study completed.   Preliminary results relayed to Dr. Henriette Combs office.  Please see CV Procedures for preliminary results.  Mahlon Gabrielle, RVT  3:33 PM 09/07/22

## 2022-09-10 ENCOUNTER — Other Ambulatory Visit: Payer: Self-pay

## 2022-09-10 NOTE — Telephone Encounter (Signed)
Called [name] to let him know his note was at the front desk. [name] states his PCP wrote him a note so he no longer needed a note from Dr. Wyline Mood. Will dispose of the note at the front desk.

## 2022-09-10 NOTE — Telephone Encounter (Signed)
Patient returned RN's call and stated can leave message at home number - [phone].

## 2022-09-10 NOTE — Telephone Encounter (Signed)
Called pt. No answer., voicemail not set up

## 2022-09-11 ENCOUNTER — Telehealth: Payer: Self-pay

## 2022-09-11 ENCOUNTER — Ambulatory Visit: Payer: Medicaid Other | Admitting: Family Medicine

## 2022-09-11 NOTE — Telephone Encounter (Signed)
Copied from CRM [id number]. Topic: General - Other >> Sep 11, 2022  3:42 PM Santiya F wrote: Reason for CRM: Melissa with [facility] is calling because pt's appointment for NM THYRD MULT UPTAKE W/IMG INJ [[id number]] is requiring a Prior Authorization before the insurance will cover it. Please advise.

## 2022-09-12 ENCOUNTER — Telehealth: Payer: Self-pay | Admitting: Internal Medicine

## 2022-09-12 ENCOUNTER — Encounter: Payer: Self-pay | Admitting: Internal Medicine

## 2022-09-12 NOTE — Telephone Encounter (Signed)
PC return today.  I left a message for [facility] letting her know that I spoke with the radiologist Dr. Bradly Chris and he recommended 24-hour uptake thyroid scan.

## 2022-09-12 NOTE — Telephone Encounter (Signed)
Letter written for pt. 

## 2022-09-14 ENCOUNTER — Other Ambulatory Visit (HOSPITAL_COMMUNITY): Payer: 59

## 2022-09-14 NOTE — Telephone Encounter (Signed)
[name] with [facility] is calling to report that the correct CPT code is needed pt's appointment for NM THYRD MULT UPTAKE W/IMG INJ [[id number]] is requiring a Prior Authorization before the insurance will cover it. Please advise. Please advise CB- [phone] Pt is scheduled 09/17/22

## 2022-09-14 NOTE — Telephone Encounter (Signed)
Prior Auth Approval #: [id number] Date ranges: 09/14/2022 - 10/29/2022 Case #: [id number] C.A.

## 2022-09-15 ENCOUNTER — Ambulatory Visit
Admission: RE | Admit: 2022-09-15 | Discharge: 2022-09-15 | Disposition: A | Discharge status: Home / Self Care | Payer: Medicaid Other | Source: Ambulatory Visit | Attending: Neurology | Admitting: Neurology

## 2022-09-15 ENCOUNTER — Other Ambulatory Visit: Payer: Self-pay | Admitting: Neurology

## 2022-09-15 DIAGNOSIS — J3489 Other specified disorders of nose and nasal sinuses: Secondary | ICD-10-CM

## 2022-09-15 DIAGNOSIS — Q309 Congenital malformation of nose, unspecified: Secondary | ICD-10-CM

## 2022-09-15 DIAGNOSIS — G510 Bell's palsy: Secondary | ICD-10-CM | POA: Diagnosis not present

## 2022-09-15 DIAGNOSIS — R519 Headache, unspecified: Secondary | ICD-10-CM | POA: Diagnosis not present

## 2022-09-15 DIAGNOSIS — J342 Deviated nasal septum: Secondary | ICD-10-CM

## 2022-09-15 DIAGNOSIS — R29810 Facial weakness: Secondary | ICD-10-CM

## 2022-09-15 MED ORDER — GADOPICLENOL 0.5 MMOL/ML IV SOLN: 10.0000 mL | Freq: Once | INTRAVENOUS | Status: DC | PRN

## 2022-09-17 ENCOUNTER — Telehealth: Payer: Self-pay | Admitting: Internal Medicine

## 2022-09-17 ENCOUNTER — Encounter (HOSPITAL_COMMUNITY): Admission: RE | Admit: 2022-09-17 | Payer: 59 | Source: Ambulatory Visit

## 2022-09-17 ENCOUNTER — Encounter (HOSPITAL_COMMUNITY): Payer: 59

## 2022-09-17 NOTE — Telephone Encounter (Signed)
Need letter for updated imaging, imaging was rescheduled due to Prior auth needed from medicaid, pt is also wanting letter releasing him back to work.

## 2022-09-17 NOTE — Telephone Encounter (Signed)
Pt is stating that he needs a consent by Dr Laural Benes today to be released to go back to work. He states that he must go back to work tomorrow in order to pay for his medications. He says he has to go back to work now. He wants a letter in [employer] releasing him for work. Pt was told that message would be forwarded promptly but agent not able give a confirmation for sure that it would be done today or would need a fu appt. Pls call pt at [phone]. Very anxious.

## 2022-09-17 NOTE — Telephone Encounter (Signed)
Called & spoke to [place] at [facility] and Efraim Kaufmann stated that authorization information was received. No further action required at this time.

## 2022-09-18 ENCOUNTER — Other Ambulatory Visit: Payer: Self-pay

## 2022-09-18 ENCOUNTER — Encounter (HOSPITAL_COMMUNITY): Payer: 59

## 2022-09-18 NOTE — Telephone Encounter (Signed)
Contacted pt, no answer & VM not set up.

## 2022-09-18 NOTE — Telephone Encounter (Signed)
Patient is already established with [facility] ENT Dr Jenne Pane. He has upcoming appt with them 09/20/22. Please cancel referral

## 2022-09-18 NOTE — Telephone Encounter (Signed)
Mychart message sent to patient informing him that letters are ready for pick-up on 09/18/2022

## 2022-09-18 NOTE — Telephone Encounter (Signed)
Pt returned call, he wanted clarification on what MD meant by neoplasm. I informed him she was referring to the mass that was seen. This is a abnormal tissue growth and can be benign (not cancer) or malignant (cancer) and we want to make sure. Pt stated he has care established with ENT at [facility], Dr Jenne Pane,  who found the mass originally. He does have an upcoming appt with them 09/20/22.  Pt verbally understood and was appreciative for the call.

## 2022-09-19 ENCOUNTER — Telehealth: Payer: Self-pay | Admitting: Internal Medicine

## 2022-09-19 NOTE — Telephone Encounter (Signed)
Previously called & spoke to [place] at [facility] on 09/17/2022. Melissa stated that all required information for the prior authorization approval was received. No further action is needed at this time.

## 2022-09-20 ENCOUNTER — Ambulatory Visit: Payer: 59 | Admitting: Physical Therapy

## 2022-09-24 ENCOUNTER — Inpatient Hospital Stay: Payer: 59

## 2022-09-24 ENCOUNTER — Encounter: Payer: Self-pay | Admitting: Hematology and Oncology

## 2022-09-24 ENCOUNTER — Inpatient Hospital Stay: Payer: 59 | Attending: Hematology and Oncology | Admitting: Hematology and Oncology

## 2022-09-24 VITALS — BP 135/104 | HR 77 | Temp 98.0°F | Resp 18 | Wt 205.2 lb

## 2022-09-24 DIAGNOSIS — I2694 Multiple subsegmental pulmonary emboli without acute cor pulmonale: Secondary | ICD-10-CM | POA: Diagnosis not present

## 2022-09-24 DIAGNOSIS — I82411 Acute embolism and thrombosis of right femoral vein: Secondary | ICD-10-CM | POA: Diagnosis not present

## 2022-09-24 DIAGNOSIS — G510 Bell's palsy: Secondary | ICD-10-CM | POA: Insufficient documentation

## 2022-09-24 DIAGNOSIS — I2699 Other pulmonary embolism without acute cor pulmonale: Secondary | ICD-10-CM

## 2022-09-24 DIAGNOSIS — J3489 Other specified disorders of nose and nasal sinuses: Secondary | ICD-10-CM

## 2022-09-24 DIAGNOSIS — J349 Unspecified disorder of nose and nasal sinuses: Secondary | ICD-10-CM | POA: Diagnosis not present

## 2022-09-24 DIAGNOSIS — Z7901 Long term (current) use of anticoagulants: Secondary | ICD-10-CM | POA: Insufficient documentation

## 2022-09-24 NOTE — Assessment & Plan Note (Signed)
[name] is likely provoked by malignant process He is doing well on anticoagulation therapy He will continue treatment for minimum of 6 months or longer He is hydrating well  I would prefer uninterrupted anticoagulation therapy for minimum of 3 months if possible However, if surgery for his right nasal cavity mass is indicated, the patient would likely need to be admitted to the hospital with bridging therapy If he were to undergo surgery at [facility], his surgeons will have to consult inpatient hematology for management Given significant clot burden, [name]

## 2022-09-24 NOTE — Progress Notes (Signed)
[facility] CONSULT NOTE  Patient Care Team: [name], MD as PCP - General (Internal Medicine) Maisie Fus, MD as PCP - Cardiology (Cardiology)   ASSESSMENT & PLAN:   Pulmonary embolism on right [facility]) [name] is likely provoked by malignant process He is doing well on anticoagulation therapy He will continue treatment for minimum of 6 months or longer He is hydrating well  I would prefer uninterrupted anticoagulation therapy for minimum of 3 months if possible However, if surgery for his right nasal cavity mass is indicated, the patient would likely need to be admitted to the hospital with bridging therapy If he were to undergo surgery at [facility], his surgeons will have to consult inpatient hematology for management Given significant clot burden, [name]  Acute deep vein thrombosis (DVT) of femoral vein of right lower extremity (HCC) [name] He will continue anticoagulation therapy as above  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 The patient knows to call the clinic with any problems, questions or concerns. The total time spent in the appointment was 60 minutes encounter with patients including review of chart and various tests results, discussions about plan of care and coordination of care plan  Artis Delay, MD 7/15/202412:59 PM  CHIEF COMPLAINTS/PURPOSE OF CONSULTATION:  Acute right lower extremity DVT and significant PE, right nasal cavity mass  HISTORY OF PRESENTING ILLNESS:  [name] 47 y.o. male is here because of recent diagnosis of DVT and PE Patient presented  to the emergency department on Jul 29, 2022 with right-sided Bell's palsy. 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  He denies recurrent nosebleeds or nasal discharge  In the meantime, he developed right lower extremity pain and swelling On August 18, 2022, venous Doppler ultrasound revealed findings consistent with occlusive thrombus within the distal RIGHT femoral vein, RIGHT popliteal vein, RIGHT posterior tibial vein and RIGHT peroneal vein. He was anticoagulated.  He also complained of chest pain. On 08/27/2022, he underwent CT angiogram which revealed large pulmonary embolism in the right PA. Extends into segmental and subsegmental branches.    He is doing well on Eliquis and denies bleeding complications.  Gradually, his chest pain went away and leg swelling improves He denies recent history of trauma, long distance travel, dehydration, recent surgery, or prolonged immobilization.  He smoked cigars several times a week up to the diagnosis of blood clot but quit since then  He had prior surgeries before and never had perioperative thromboembolic events. The patient is not on testosterone replacement therapy  There is no family history of blood clots or miscarriages.  MEDICAL HISTORY:  Past Medical History:  Diagnosis Date   Hypertension 2003   dx age 50    Wears glasses     SURGICAL  HISTORY: Past Surgical History:  Procedure Laterality Date   CHOLECYSTECTOMY      SOCIAL HISTORY: Social History   Socioeconomic History   Marital status: Single    Spouse name: Not on file   Number of children: 3    Years of education: Not on file   Highest education level: Not on file  Occupational History   Occupation: manual labor  Tobacco Use   Smoking status: Former    Current packs/day: 0.00    Types: Cigars, Cigarettes    Quit date: 05/10/2015    Years since quitting: 7.3   Smokeless tobacco: Never  Vaping Use   Vaping status: Never Used  Substance and Sexual Activity   Alcohol use: Yes    Comment: occ   Drug use: Yes    Types: Marijuana   Sexual activity: Not on file  Other Topics Concern   Not on file  Social History Narrative   Not on file   Social Determinants of Health   Financial Resource Strain: Low Risk  (05/31/2022)   Overall Financial Resource Strain (CARDIA)    Difficulty of Paying Living Expenses: Not very hard  Food Insecurity: No Food Insecurity (05/31/2022)   Hunger Vital Sign    Worried About Running Out of Food in the Last Year: Never true    Ran Out of Food in the Last Year: Never true  Transportation Needs: No Transportation Needs (05/31/2022)   PRAPARE - Administrator, Civil Service (Medical): No    Lack of Transportation (Non-Medical): No  Physical Activity: Unknown (08/07/2022)   Exercise Vital Sign    Days of Exercise per Week: 5 days    Minutes of Exercise per Session: Patient declined  Recent Concern: Physical Activity - Inactive (05/31/2022)   Exercise Vital Sign    Days of Exercise per Week: 0 days    Minutes of Exercise per Session: 0 min  Stress: No Stress Concern Present (08/07/2022)   [employer] of Occupational Health - Occupational Stress Questionnaire    Feeling of Stress : Only a little  Social Connections: Unknown (08/07/2022)   Social Connection and Isolation Panel [NHANES]    Frequency of Communication with Friends and Family: Twice a week    Frequency of Social Gatherings with Friends and Family: Twice a week    Attends Religious Services: Never    Database administrator or Organizations: No    Attends Museum/gallery exhibitions officer: Never    Marital Status: Patient declined  Recent Concern: Social Connections - Moderately Isolated (05/31/2022)   Social Connection and Isolation Panel [NHANES]    Frequency of Communication with Friends and Family: Twice a week    Frequency of Social Gatherings with Friends and Family: Twice a week    Attends Religious Services: Never    Database administrator or Organizations: No    Attends Banker Meetings: Never    Marital Status: Living with partner  Intimate Partner Violence: Not At Risk (05/31/2022)   Humiliation, Afraid, Rape, and Kick questionnaire    Fear of Current or Ex-Partner: No    Emotionally Abused: No    Physically Abused: No    Sexually Abused: No    FAMILY HISTORY: Family History  Problem Relation Age of Onset   Cancer Mother        breast cancer    ALLERGIES:  is allergic to doxycycline and penicillins.  MEDICATIONS:  Current Outpatient Medications  Medication Sig Dispense Refill  amLODipine (NORVASC) 10 MG tablet Take 1 tablet (10 mg total) by mouth daily. 90 tablet 1   apixaban (ELIQUIS) 5 MG TABS tablet Take 1 tablet (5 mg total) by mouth 2 (two) times daily. Start taking after completion of starter pack. 60 tablet 5   APIXABAN (ELIQUIS) VTE STARTER PACK (10MG  AND 5MG ) Take as directed on package: start with two-5mg  tablets twice daily for 7 days. On day 8, switch to one-5mg  tablet twice daily. 74 each 0   BIOTIN PO Take 1,000 mg by mouth daily.     carvedilol (COREG) 25 MG tablet Take 1 tablet (25 mg total) by mouth 2 (two) times daily with a meal. 180 tablet 1   losartan (COZAAR) 100 MG tablet Take 1 tablet (100 mg total) by mouth daily. 90 tablet 1   methocarbamol (ROBAXIN) 500 MG tablet Take 1 tablet (500 mg total) by mouth 2 (two) times daily as needed for muscle spasms. 30 tablet 0   sildenafil (VIAGRA) 100 MG tablet Take 0.5-1 tablets (50-100 mg total) by mouth daily as needed for erectile dysfunction. 10 tablet  11   spironolactone (ALDACTONE) 50 MG tablet Take 1 tablet (50 mg total) by mouth daily. 90 tablet 1   Vitamin D, Ergocalciferol, (DRISDOL) 1.25 MG (50000 UNIT) CAPS capsule Take 1 capsule (50,000 Units total) by mouth every 7 (seven) days. 12 capsule 1   No current facility-administered medications for this visit.    REVIEW OF SYSTEMS:   Constitutional: Denies fevers, chills or abnormal night sweats Eyes: Denies blurriness of vision, double vision or watery eyes Ears, nose, mouth, throat, and face: Denies mucositis or sore throat Respiratory: Denies cough, dyspnea or wheezes Cardiovascular: Denies palpitation, chest discomfort or lower extremity swelling Gastrointestinal:  Denies nausea, heartburn or change in bowel habits Skin: Denies abnormal skin rashes Lymphatics: Denies new lymphadenopathy or easy bruising Neurological:Denies numbness, tingling or new weaknesses Behavioral/Psych: Mood is stable, no new changes  All other systems were reviewed with the patient and are negative.  PHYSICAL EXAMINATION: ECOG PERFORMANCE STATUS: 1 - Symptomatic but completely ambulatory  Vitals:   09/24/22 1229  BP: (!) 135/104  Pulse: 77  Resp: 18  Temp: 98 F (36.7 C)  SpO2: 100%   Filed Weights   09/24/22 1229  Weight: 205 lb 3.2 oz (93.1 kg)    GENERAL:alert, no distress and comfortable SKIN: skin color, texture, turgor are normal, no rashes or significant lesions EYES: normal, conjunctiva are pink and non-injected, sclera clear OROPHARYNX:no exudate, no erythema and lips, buccal mucosa, and tongue normal  NECK: supple, thyroid normal size, non-tender, without nodularity LYMPH:  no palpable lymphadenopathy in the cervical, axillary or inguinal LUNGS: clear to auscultation and percussion with normal breathing effort HEART: regular rate & rhythm and no murmurs and no lower extremity edema ABDOMEN:abdomen soft, non-tender and normal bowel sounds Musculoskeletal:no cyanosis of digits  and no clubbing  PSYCH: alert & oriented x 3 with fluent speech NEURO: no focal motor/sensory deficits  LABORATORY DATA:  I have reviewed the data as listed Lab Results  Component Value Date   WBC 7.3 08/18/2022   HGB 14.9 08/18/2022   HCT 43.0 08/18/2022   MCV 84.5 08/18/2022   PLT 139 (L) 08/18/2022     RADIOGRAPHIC STUDIES: I have personally reviewed the radiological images as listed and agreed with the findings in the report. MR BRAIN WO CONTRAST  Result Date: 09/15/2022  [facility] [address], [address] [place], [place] [address] [phone] NEUROIMAGING  REPORT STUDY DATE: 09/15/2022 PATIENT NAME: DEVYN SHEERIN DOB: [birth year] MRN: [id number] EXAM: MRI Brain without contrast ORDERING CLINICIAN: Naomie Dean, MD CLINICAL HISTORY: 47 year old man with right-sided Bell's palsy and a nasal cavity mass COMPARISON FILMS: CT head 06/22/2014 and CT maxillofacial 08/15/2022 TECHNIQUE: MRI of the brain without contrast was obtained utilizing 5 mm axial slices with T1, T2, T2 flair, SWI and diffusion weighted views.  T1 sagittal and T2 coronal views were obtained. CONTRAST: none (the study was ordered with and without the due to claustrophobia the study ended before contrast) IMAGING SITE: [facility], [address] [place], [place] FINDINGS: On sagittal images, the spinal cord is imaged caudally to C3 and is normal in caliber.   The contents of the posterior fossa are of normal size and position.   The pituitary gland and optic chiasm appear normal.    Brain volume appears normal.   The ventricles are normal in size and without distortion.  There are no abnormal extra-axial collections of fluid.  The cerebellum and brainstem appears normal.   The deep gray matter appears normal.   There are several punctate T2/FLAIR hypertense foci in the subcortical or deep white matter of the cerebral hemispheres.  None of these appear to be acute.  Diffusion weighted images are normal.   Susceptibility weighted images are normal.  There is a 43 x 27 x 49 mm (AP x transverse x craniocaudal) mass in the right nasopharynx, unchanged compared to the CT scan from 08/15/2022 but increase in size compared to the CT scan from 06/22/2014.  The mass occludes the right ostiomeatal complex and distal to the right maxillary sinus.  There is an air-fluid level in the right maxillary sinus, not present on the CT scan from 08/15/2022.  One of the right ethmoid air cells is opacified.  There is minimal mucoperiosteal thickening of the frontal recesses though the frontal sinuses appear normal.  The sphenoid sinus and left maxillary sinus appear normal..   The orbits appear normal.   The VIIth/VIIIth nerve complex appears normal (in this noncontrast study).  The mastoid air cells appear normal.  Flow voids are identified within the major intracerebral arteries. Due to claustrophobia, the study ended before contrasted images could be obtained, limiting interpretation of the facial nerves.   This MRI of the brain without contrast shows the following: Few scattered T2/FLAIR hyperintense foci in the cerebral hemispheres are most consistent with very minimal chronic microvascular ischemic change.  Not appear to be acute and these are unlikely to be symptomatic. 43 x 27 x 49 mm mass in the right nasopharynx seen on previous studies.  This is worrisome for neoplasm.  If not already done so recommend ENT further evaluation. In this noncontrasted study, the right facial nerve appeared normal though inflammation of a Bell's palsy cannot be assessed without contrast. INTERPRETING PHYSICIAN: Richard A. Epimenio Foot, MD, PhD, FAAN Certified in  Neuroimaging by [employer]   VAS [place] LOWER EXTREMITY VENOUS (DVT)  Result Date: 09/07/2022  Lower Venous DVT Study Patient Name:  ALDAIR RICKEL  Date of Exam:   09/07/2022 Medical Rec #: [id number]        Accession #:    [id number] Date of Birth: 1975-08-05        Patient  Gender: M Patient Age:   26 years Exam Location:  [facility] Procedure:      VAS [place] LOWER EXTREMITY VENOUS (DVT) Referring Phys: Jonah Blue --------------------------------------------------------------------------------  Indications: Follow up DVT scan,  Pain, and Swelling.  Anticoagulation: Eliquis. Comparison Study: Previous study 08/18/22 positive in distal fv, popliteal. PTV,                   peroneal Performing Technologist: McKayla Maag RVT, VT  Examination Guidelines: A complete evaluation includes B-mode imaging, spectral Doppler, color Doppler, and power Doppler as needed of all accessible portions of each vessel. Bilateral testing is considered an integral part of a complete examination. Limited examinations for reoccurring indications may be performed as noted. The reflux portion of the exam is performed with the patient in reverse Trendelenburg.  +---------+---------------+---------+-----------+----------+--------------+ RIGHT    CompressibilityPhasicitySpontaneityPropertiesThrombus Aging +---------+---------------+---------+-----------+----------+--------------+ CFV      Full           Yes      Yes                                 +---------+---------------+---------+-----------+----------+--------------+ SFJ      Full                                                        +---------+---------------+---------+-----------+----------+--------------+ FV Prox  Full                                                        +---------+---------------+---------+-----------+----------+--------------+ FV Mid   Full                                                        +---------+---------------+---------+-----------+----------+--------------+ FV DistalPartial        Yes      Yes                  Acute          +---------+---------------+---------+-----------+----------+--------------+ PFV      Full                                                         +---------+---------------+---------+-----------+----------+--------------+ POP      None           No       No                   Acute          +---------+---------------+---------+-----------+----------+--------------+ PTV      Full                                                        +---------+---------------+---------+-----------+----------+--------------+ PERO     Full                                                        +---------+---------------+---------+-----------+----------+--------------+   +----+---------------+---------+-----------+----------+--------------+  LEFTCompressibilityPhasicitySpontaneityPropertiesThrombus Aging +----+---------------+---------+-----------+----------+--------------+ CFV Full           Yes      Yes                                 +----+---------------+---------+-----------+----------+--------------+ SFJ Full                                                        +----+---------------+---------+-----------+----------+--------------+    Summary: RIGHT: - Findings consistent with acute deep vein thrombosis involving the right distal femoral vein, and right popliteal vein. - Findings appear improved from previous examination. - No cystic structure found in the popliteal fossa.  LEFT: - No evidence of common femoral vein obstruction.  *See table(s) above for measurements and observations. Electronically signed by Coral Else MD on 09/07/2022 at 10:44:21 PM.    Final    US THYROID  Result Date: 09/04/2022 CLINICAL DATA:  47 year old male with history of thyroid nodule. EXAM: THYROID ULTRASOUND TECHNIQUE: Ultrasound examination of the thyroid gland and adjacent soft tissues was performed. COMPARISON:  None Available. FINDINGS: Parenchymal Echotexture: Mildly heterogeneous Isthmus: 0.6 cm Right lobe: 6.5 x 3.2 x 3.7 cm Left lobe: 6.9 x 2.7 x 2.7 cm ________________________________________________________ Estimated total number of  nodules >/= 1 cm: 6-10 Number of spongiform nodules >/=  2 cm not described below (TR1): 0 Number of mixed cystic and solid nodules >/= 1.5 cm not described below (TR2): 0 _________________________________________________________ Nodule # 1: Location: Right; Mid Maximum size: 2.5 cm; Other 2 dimensions: 1.1 x 1.9 cm Composition: solid/almost completely solid (2) Echogenicity: hyperechoic (1) Shape: not taller-than-wide (0) Margins: ill-defined (0) Echogenic foci: none (0) ACR TI-RADS total points: 3. ACR TI-RADS risk category: TR3 (3 points). ACR TI-RADS recommendations: **Given size (>/= 2.5 cm) and appearance, fine needle aspiration of this mildly suspicious nodule should be considered based on TI-RADS criteria. _________________________________________________________ Nodule # 2: Location: Right; Inferior Maximum size: 1.8 cm; Other 2 dimensions: 1.6 x 1.1 cm Composition: spongiform (0) Echogenicity: hypoechoic (2) Shape: not taller-than-wide (0) Margins: smooth (0) Echogenic foci: none (0) ACR TI-RADS total points: 2. ACR TI-RADS risk category: TR2 (2 points). ACR TI-RADS recommendations: This nodule does NOT meet TI-RADS criteria for biopsy or dedicated follow-up. _________________________________________________________ Nodule # 3: Location: Right; Mid Maximum size: 1.9 cm; Other 2 dimensions: 0.6 x 0 7 cm Composition: mixed cystic and solid (1) Echogenicity: isoechoic (1) Shape: not taller-than-wide (0) Margins: smooth (0) Echogenic foci: none (0) ACR TI-RADS total points: 2. ACR TI-RADS risk category: TR2 (2 points). ACR TI-RADS recommendations: This nodule does NOT meet TI-RADS criteria for biopsy or dedicated follow-up. _________________________________________________________ Nodule # 4: Location: Right; Superior Maximum size: 0.7 cm; Other 2 dimensions: 0.5 x 0.2 cm Composition: cystic/almost completely cystic (0) Echogenicity: anechoic (0) Shape: not taller-than-wide (0) Margins: smooth (0) Echogenic  foci: none (0) ACR TI-RADS total points: 0. ACR TI-RADS risk category: TR1 (0-1 points). ACR TI-RADS recommendations: This nodule does NOT meet TI-RADS criteria for biopsy or dedicated follow-up. _________________________________________________________ Nodule # 5: Location: Right; Inferior Maximum size: 2.9 cm; Other 2 dimensions: 2.9 x 2 1 cm Composition: mixed cystic and solid (1) Echogenicity: hypoechoic (2) Shape: not taller-than-wide (0) Margins: ill-defined (0) Echogenic foci: none (0) ACR TI-RADS total points: 3. ACR TI-RADS  risk category: TR3 (3 points). ACR TI-RADS recommendations: **Given size (>/= 2.5 cm) and appearance, fine needle aspiration of this mildly suspicious nodule should be considered based on TI-RADS criteria. _________________________________________________________ Nodule # 6: Location: Left; Superior Maximum size: 2.0 cm; Other 2 dimensions: 1.3 x 0.7 cm Composition: mixed cystic and solid (1) Echogenicity: hypoechoic (2) Shape: not taller-than-wide (0) Margins: smooth (0) Echogenic foci: none (0) ACR TI-RADS total points: 3. ACR TI-RADS risk category: TR3 (3 points). ACR TI-RADS recommendations: *Given size (>/= 1.5 - 2.4 cm) and appearance, a follow-up ultrasound in 1 year should be considered based on TI-RADS criteria. _________________________________________________________ Nodule # 7: Location: Left; Superior Maximum size: 1.1 cm; Other 2 dimensions: 0.6 x 0.9 cm Composition: mixed cystic and solid (1) Echogenicity: isoechoic (1) Shape: not taller-than-wide (0) Margins: smooth (0) Echogenic foci: none (0) ACR TI-RADS total points: 2. ACR TI-RADS risk category: TR2 (2 points). ACR TI-RADS recommendations: This nodule does NOT meet TI-RADS criteria for biopsy or dedicated follow-up. _________________________________________________________ No cervical lymphadenopathy. IMPRESSION: 1. Multinodular goiter. 2. Solid nodules in the right mid (labeled 1, 2.5 cm) and right inferior (labeled  5, 2.9 cm) meet criteria (TI-RADS category 3) for tissue sampling. Recommend ultrasound-guided fine-needle aspiration of both nodules. 3. Solid cystic nodule in the left superior thyroid (labeled 6, 2.0 cm) meets criteria (TI-RADS category 3) for 1 year ultrasound surveillance. 4. The remaining visualized thyroid nodules appear benign and do not warrant additional follow-up. The above is in keeping with the ACR TI-RADS recommendations - J Am Coll Radiol 2017;14:587-595. Marliss Coots, MD [facility] Electronically Signed   By: Marliss Coots M.D.   On: 09/04/2022 12:14   CT CORONARY MORPH W/CTA COR W/SCORE W/CA W/CM &/OR WO/CM  Addendum Date: 09/01/2022   ADDENDUM REPORT: 09/01/2022 01:45 EXAM: OVER-READ INTERPRETATION  CT CHEST The following report is an over-read performed by radiologist Dr. Aram Candela of [facility] on 09/01/2022. This over-read does not include interpretation of cardiac or coronary anatomy or pathology. The coronary calcium score/coronary CTA interpretation by the cardiologist is attached. COMPARISON:  May 19, 2010 FINDINGS: Cardiovascular: A large amount of intraluminal low attenuation is seen involving the right pulmonary artery and extending to involve numerous right middle lobe and right lower lobe branches. This is mentioned in the body and impression of the primary report. Mediastinum/Nodes: There are no enlarged lymph nodes within the visualized mediastinum. Lungs/Pleura: There is no pleural effusion. The visualized lungs appear clear. Upper abdomen: No significant findings in the visualized upper abdomen. Musculoskeletal/Chest wall: No chest wall mass or suspicious osseous findings within the visualized chest. IMPRESSION: Large amount of right-sided pulmonary embolism, as described above. This was mentioned in the body and impression of the primary report, with findings communicated to Dr. Carolan Clines stated  in the impression. Electronically Signed   By: Aram Candela M.D.   On: 09/01/2022 01:45   Result Date: 09/01/2022 HISTORY: Chest pain/anginal equiv, intermediate CAD risk, not treadmill candidate EXAM: Cardiac/Coronary CT TECHNIQUE: The patient was scanned on a [employer]. PROTOCOL: A 120 kV prospective scan was triggered in the descending thoracic aorta at 111 HU's. Axial non-contrast 3 mm slices were carried out through the heart. The data set was analyzed on a dedicated work station and scored using the Agatston method. Gantry rotation speed was 250 msecs and collimation was 0.6 mm. Heart rate was optimized medically and sl NTG was given. The 3D data set was reconstructed in 5% intervals  of the 35-75 % of the R-R cycle. Systolic and diastolic phases were analyzed on a dedicated work station using MPR, MIP and VRT modes. The patient received OMNIPAQUE IOHEXOL 350 MG/ML SOLN of contrast. FINDINGS: Coronary calcium score: The patient's coronary artery calcium score is 0, which places the patient in the 0 percentile. Coronary arteries: Normal coronary origins.  Right dominance. Right Coronary Artery: Normal caliber vessel, gives rise to PDA. No significant plaque or stenosis. Left Main Coronary Artery: Normal caliber vessel. No significant plaque or stenosis. Left Anterior Descending Coronary Artery: Normal caliber vessel. No significant plaque or stenosis. Gives rise to five very small diagonal branches. Left Circumflex Artery: Normal caliber vessel. No significant plaque or stenosis. Gives rise to large, branching OM1 without significant plaque or stenosis. Aorta: Normal size, 33 mm at the mid ascending aorta (level of the PA bifurcation) measured double oblique. No aortic atherosclerosis. No dissection seen in visualized portions of the aorta. Aortic Valve: No calcifications. Trileaflet. Other findings: Normal pulmonary vein drainage into the left atrium. Normal left atrial appendage  without a thrombus. Normal size of the pulmonary artery. Large pulmonary embolism seen in the right PA. Visualized portion measures 3.56 cm. Extends into segmental and subsegmental branches. Left PA not well visualized. Normal appearance of the pericardium. IMPRESSION: 1. Large pulmonary embolism in the right PA. Extends into segmental and subsegmental branches. Findings communicated to Dr. Carolan Clines. 2. No evidence of CAD, CADRADS = 0. 2. Coronary calcium score of 0. This was 0 percentile for age-, sex-, and race- matched controls. 3. Normal coronary origin with right dominance. INTERPRETATION: CAD-RADS 0: No evidence of CAD (0%). Consider non-atherosclerotic causes of chest pain. Electronically Signed: By: Jodelle Red M.D. On: 08/27/2022 17:16

## 2022-09-24 NOTE — Assessment & Plan Note (Signed)
[name] He will continue anticoagulation therapy as above

## 2022-09-24 NOTE — Assessment & Plan Note (Signed)
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

## 2022-09-27 ENCOUNTER — Other Ambulatory Visit: Payer: Self-pay | Admitting: Hematology and Oncology

## 2022-09-27 ENCOUNTER — Telehealth: Payer: Self-pay

## 2022-09-27 ENCOUNTER — Other Ambulatory Visit: Payer: Self-pay

## 2022-09-27 ENCOUNTER — Telehealth: Payer: Self-pay | Admitting: Hematology and Oncology

## 2022-09-27 ENCOUNTER — Other Ambulatory Visit: Payer: Self-pay | Admitting: Internal Medicine

## 2022-09-27 DIAGNOSIS — I82411 Acute embolism and thrombosis of right femoral vein: Secondary | ICD-10-CM

## 2022-09-27 DIAGNOSIS — I2699 Other pulmonary embolism without acute cor pulmonale: Secondary | ICD-10-CM

## 2022-09-27 MED ORDER — CLINDAMYCIN HCL 150 MG PO CAPS
150.0000 mg | ORAL_CAPSULE | Freq: Three times a day (TID) | ORAL | 0 refills | Status: DC
  Filled 2022-09-27: qty 21, 7d supply, fill #0

## 2022-09-27 NOTE — Telephone Encounter (Signed)
Called and given below message. Given appt at [facility] for doppler on 9/3 at 10 am. He verbalized understanding.

## 2022-09-27 NOTE — Telephone Encounter (Signed)
I have reviewed his case with Dr. Chestine Spore Dr. Chestine Spore felt that given the mass has been present for many years, there is no urgency in terms of surgery to remove the mass I would prefer the patient to be anticoagulated uninterrupted for 3 months since his original diagnosis of DVT in early June, meaning, he would be safe to proceed without bridging therapy by the second week of September I plan to see the patient back first week of September with repeat blood work and ultrasound evaluation The case was fully discussed with her and went in agreement to proceed with the plan of care as discussed

## 2022-09-27 NOTE — Telephone Encounter (Signed)
-----   Message from Artis Delay sent at 09/27/2022  9:50 AM EDT ----- Pls let him know I spoke with Dr. Chestine Spore, plan for me to see him in Sept with repeat labs and UV venous doppler I sent LOS Please schedule Venous doppler US for 9/3

## 2022-10-01 ENCOUNTER — Ambulatory Visit (HOSPITAL_COMMUNITY): Payer: Medicaid Other

## 2022-10-01 ENCOUNTER — Encounter (HOSPITAL_COMMUNITY)
Admission: RE | Admit: 2022-10-01 | Discharge: 2022-10-01 | Disposition: A | Discharge status: Home / Self Care | Payer: 59 | Source: Ambulatory Visit | Attending: Internal Medicine | Admitting: Internal Medicine

## 2022-10-01 DIAGNOSIS — R7989 Other specified abnormal findings of blood chemistry: Secondary | ICD-10-CM | POA: Diagnosis not present

## 2022-10-01 DIAGNOSIS — E042 Nontoxic multinodular goiter: Secondary | ICD-10-CM | POA: Diagnosis not present

## 2022-10-01 DIAGNOSIS — I82411 Acute embolism and thrombosis of right femoral vein: Secondary | ICD-10-CM | POA: Diagnosis not present

## 2022-10-01 MED ORDER — SODIUM IODIDE I-123 7.4 MBQ CAPS
563.0000 | ORAL_CAPSULE | Freq: Once | ORAL | Status: AC
  Administered 2022-10-01: 563 via ORAL

## 2022-10-02 ENCOUNTER — Encounter (HOSPITAL_COMMUNITY)
Admission: RE | Admit: 2022-10-02 | Discharge: 2022-10-02 | Disposition: A | Discharge status: Home / Self Care | Payer: 59 | Source: Ambulatory Visit | Attending: Internal Medicine | Admitting: Internal Medicine

## 2022-10-02 ENCOUNTER — Encounter (HOSPITAL_COMMUNITY): Payer: Self-pay

## 2022-10-02 ENCOUNTER — Ambulatory Visit (HOSPITAL_COMMUNITY)
Admission: RE | Admit: 2022-10-02 | Discharge: 2022-10-02 | Disposition: A | Discharge status: Home / Self Care | Payer: 59 | Source: Ambulatory Visit | Attending: Vascular Surgery | Admitting: Vascular Surgery

## 2022-10-02 VITALS — BP 145/99 | HR 73

## 2022-10-02 DIAGNOSIS — R7989 Other specified abnormal findings of blood chemistry: Secondary | ICD-10-CM | POA: Insufficient documentation

## 2022-10-02 DIAGNOSIS — I82411 Acute embolism and thrombosis of right femoral vein: Secondary | ICD-10-CM | POA: Diagnosis not present

## 2022-10-02 DIAGNOSIS — E010 Iodine-deficiency related diffuse (endemic) goiter: Secondary | ICD-10-CM | POA: Diagnosis not present

## 2022-10-02 DIAGNOSIS — E042 Nontoxic multinodular goiter: Secondary | ICD-10-CM | POA: Diagnosis not present

## 2022-10-02 NOTE — Progress Notes (Addendum)
DVT Clinic Note  Name: [name]     MRN: [id number]     DOB: 03-Aug-1975     Sex: male  PCP: [name], MD  Today's Visit: Visit Information: Follow Up Visit  Referred to DVT Clinic by: Ralph Leyden, PA-C [facility])   Referred to CPP by: Dr. Edilia Bo Reason for referral:  Chief Complaint  Patient presents with   Med Management - DVT   HISTORY OF PRESENT ILLNESS: [name] is a 47 y.o. male with PMH HTN, Bell's palsy, who presents for follow up medication management after diagnosis of DVT on 08/18/22 when ultrasound showed acute occlusive DVT involving the right femoral, popliteal, posterior tibial, and peroneal veins. 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 Around that same time, the patient had imaging done which showed a nasal cavity mass which was initially seen in 2016 and nodules on his thyroid. He also had a coronary CT 08/27/22 for calcium scoring which showed a large amount of right-sided pulmonary embolism, and Dr. Wyline Mood continued the patient on Eliquis. 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 She has coordinated with his otolaryngologist at [facility], and they are planning for surgery after 3 months of uninterrupted anticoagulation. He was referred to radiation oncology as well. He sees his hematologist again in September for repeat imaging and labs.   Today, the patient reports he is no longer having any pain or swelling in his RLE. He needed to reschedule his appointment from yesterday due to having tests done to learn more about the thyroid nodules seen on previous imaging. He is having no SOB, chest pain, palpitations. Denies abnormal bleeding or bruising. Reports 2 missed doses of  Eliquis that were both in the evening. He has been trying hard to limit missed doses. He is having no problems with cost of Eliquis or obtaining his refills.   Positive Thrombotic Risk Factors: Active cancer Bleeding Risk Factors: Anticoagulant therapy, Cancer  Negative Thrombotic Risk Factors: Previous VTE, Recent surgery (within 3 months), Recent trauma (within 3 months), Recent admission to hospital with acute illness (within 3 months), Paralysis, paresis, or recent plaster cast immobilization of lower extremity, Central venous catheterization, Bed rest >72 hours within 3 months, Sedentary journey lasting >8 hours within 4 weeks, Pregnancy, Within 6 weeks postpartum, Recent cesarean section (within 3 months), Estrogen therapy, Testosterone therapy, Erythropoiesis-stimulating agent, Recent COVID diagnosis (within 3 months), Non-malignant, chronic inflammatory condition, Known thrombophilic condition, Older age, Smoking, Obesity  Rx Insurance Coverage: Medicaid Rx Affordability: Eliquis is $4/month  Preferred Pharmacy: Refills sent to [facility]   Past Medical History:  Diagnosis Date   Hypertension 2003   dx age 34    Wears glasses     Past Surgical History:  Procedure Laterality Date   CHOLECYSTECTOMY      Social History   Socioeconomic History   Marital status: Single    Spouse name: Not on file   Number of children: 3   Years of education: Not on file   Highest education level: Not on file  Occupational History   Occupation: manual labor  Tobacco Use   Smoking status: Former    Current packs/day: 0.00    Types: Cigars, Cigarettes  Quit date: 05/10/2015    Years since quitting: 7.4   Smokeless tobacco: Never  Vaping Use   Vaping status: Never Used  Substance and Sexual Activity   Alcohol use: Yes    Comment: occ   Drug use: Yes    Types: Marijuana   Sexual activity: Not on file  Other Topics Concern   Not on file  Social History  Narrative   Not on file   Social Determinants of Health   Financial Resource Strain: Low Risk  (05/31/2022)   Overall Financial Resource Strain (CARDIA)    Difficulty of Paying Living Expenses: Not very hard  Food Insecurity: No Food Insecurity (05/31/2022)   Hunger Vital Sign    Worried About Running Out of Food in the Last Year: Never true    Ran Out of Food in the Last Year: Never true  Transportation Needs: No Transportation Needs (05/31/2022)   PRAPARE - Administrator, Civil Service (Medical): No    Lack of Transportation (Non-Medical): No  Physical Activity: Unknown (08/07/2022)   Exercise Vital Sign    Days of Exercise per Week: 5 days    Minutes of Exercise per Session: Patient declined  Recent Concern: Physical Activity - Inactive (05/31/2022)   Exercise Vital Sign    Days of Exercise per Week: 0 days    Minutes of Exercise per Session: 0 min  Stress: No Stress Concern Present (08/07/2022)   [employer] of Occupational Health - Occupational Stress Questionnaire    Feeling of Stress : Only a little  Social Connections: Unknown (08/07/2022)   Social Connection and Isolation Panel [NHANES]    Frequency of Communication with Friends and Family: Twice a week    Frequency of Social Gatherings with Friends and Family: Twice a week    Attends Religious Services: Never    Database administrator or Organizations: No    Attends Engineer, structural: Never    Marital Status: Patient declined  Recent Concern: Social Connections - Moderately Isolated (05/31/2022)   Social Connection and Isolation Panel [NHANES]    Frequency of Communication with Friends and Family: Twice a week    Frequency of Social Gatherings with Friends and Family: Twice a week    Attends Religious Services: Never    Database administrator or Organizations: No    Attends Engineer, structural: Never    Marital Status: Living with partner  Intimate Partner Violence: Not At Risk  (05/31/2022)   Humiliation, Afraid, Rape, and Kick questionnaire    Fear of Current or Ex-Partner: No    Emotionally Abused: No    Physically Abused: No    Sexually Abused: No    Family History  Problem Relation Age of Onset   Cancer Mother        breast cancer    Allergies as of 10/02/2022 - Review Complete 10/02/2022  Allergen Reaction Noted   Doxycycline Other (See Comments) 08/13/2022   Penicillins Swelling and Rash 05/19/2010    Current Outpatient Medications on File Prior to Encounter  Medication Sig Dispense Refill   amLODipine (NORVASC) 10 MG tablet Take 1 tablet (10 mg total) by mouth daily. 90 tablet 1   apixaban (ELIQUIS) 5 MG TABS tablet Take 1 tablet (5 mg total) by mouth 2 (two) times daily. Start taking after completion of starter pack. 60 tablet 5   carvedilol (COREG) 25 MG tablet Take 1 tablet (25 mg total) by mouth 2 (two) times daily with  a meal. 180 tablet 1   clindamycin (CLEOCIN) 150 MG capsule Take 1 capsule (150 mg total) by mouth 3 (three) times daily. 21 capsule 0   losartan (COZAAR) 100 MG tablet Take 1 tablet (100 mg total) by mouth daily. 90 tablet 1   methocarbamol (ROBAXIN) 500 MG tablet Take 1 tablet (500 mg total) by mouth 2 (two) times daily as needed for muscle spasms. 30 tablet 0   spironolactone (ALDACTONE) 50 MG tablet Take 1 tablet (50 mg total) by mouth daily. 90 tablet 1   sildenafil (VIAGRA) 100 MG tablet Take 0.5-1 tablets (50-100 mg total) by mouth daily as needed for erectile dysfunction. (Patient not taking: Reported on 10/02/2022) 10 tablet 11   Vitamin D, Ergocalciferol, (DRISDOL) 1.25 MG (50000 UNIT) CAPS capsule Take 1 capsule (50,000 Units total) by mouth every 7 (seven) days. (Patient not taking: Reported on 10/02/2022) 12 capsule 1   No current facility-administered medications on file prior to encounter.   REVIEW OF SYSTEMS:  Review of Systems  Respiratory:  Negative for shortness of breath.   Cardiovascular:  Negative for chest  pain, palpitations and leg swelling.  Musculoskeletal:  Negative for myalgias.  Neurological:  Negative for dizziness and tingling.   PHYSICAL EXAMINATION:  Vitals:   10/02/22 1431  BP: (!) 145/99  Pulse: 73  SpO2: 99%    Physical Exam Vitals reviewed.  Cardiovascular:     Rate and Rhythm: Normal rate.  Pulmonary:     Effort: Pulmonary effort is normal.  Musculoskeletal:        General: No tenderness.     Right lower leg: No edema.     Left lower leg: No edema.  Skin:    Findings: No bruising or erythema.  Psychiatric:        Mood and Affect: Mood normal.        Behavior: Behavior normal.        Thought Content: Thought content normal.   Villalta Score for Post-Thrombotic Syndrome: Pain: Absent Cramps: Absent Heaviness: Absent Paresthesia: Absent Pruritus: Absent Pretibial Edema: Absent Skin Induration: Absent Hyperpigmentation: Absent Redness: Absent Venous Ectasia: Absent Pain on calf compression: Absent Villalta Preliminary Score: 0 Is venous ulcer present?: No If venous ulcer is present and score is <15, then 15 points total are assigned: Absent Villalta Total Score: 0  LABS:  CBC     Component Value Date/Time   WBC 7.3 08/18/2022 2126   RBC 5.09 08/18/2022 2126   HGB 14.9 08/18/2022 2126   HGB 17.4 04/05/2022 1010   HCT 43.0 08/18/2022 2126   HCT 51.9 (H) 04/05/2022 1010   PLT 139 (L) 08/18/2022 2126   PLT 285 04/05/2022 1010   MCV 84.5 08/18/2022 2126   MCV 86 04/05/2022 1010   MCH 29.3 08/18/2022 2126   MCHC 34.7 08/18/2022 2126   RDW 14.1 08/18/2022 2126   RDW 12.9 04/05/2022 1010   LYMPHSABS 3.2 08/18/2022 2126   LYMPHSABS 2.7 04/05/2022 1010   MONOABS 0.5 08/18/2022 2126   EOSABS 0.2 08/18/2022 2126   EOSABS 0.1 04/05/2022 1010   BASOSABS 0.0 08/18/2022 2126   BASOSABS 0.0 04/05/2022 1010    Hepatic Function      Component Value Date/Time   PROT 8.5 (H) 08/28/2020 1557   PROT 8.0 01/29/2019 1044   ALBUMIN 4.1 08/28/2020 1557    ALBUMIN 4.3 01/29/2019 1044   AST 19 08/28/2020 1557   ALT 25 08/28/2020 1557   ALKPHOS 86 08/28/2020 1557   BILITOT 0.6 08/28/2020  1557   BILITOT 0.7 01/29/2019 1044   BILIDIR 0.2 05/19/2010 0315   IBILI 0.5 05/19/2010 0315    Renal Function   Lab Results  Component Value Date   CREATININE 1.23 08/18/2022   CREATININE 0.81 08/13/2022   CREATININE 0.86 04/06/2022    CrCl cannot be calculated (Patient's most recent lab result is older than the maximum 21 days allowed.).   [facility] Vascular Lab Studies:  08/18/22 doppler IMPRESSION: Findings consistent with occlusive thrombus within the distal RIGHT femoral vein, RIGHT popliteal vein, RIGHT posterior tibial vein and RIGHT peroneal vein.  ASSESSMENT: Location of DVT: Right femoral vein, Right popliteal vein, Right distal vein Cause of DVT: provoked by a persistent risk factor (active malignancy)  Patient is tolerating Eliquis treatment well with no bleeding events. His RLE pain and swelling have resolved. Since his last visit he was diagnosed with a PE and is currently being worked up for a rare type of malignancy. He is followed by hematology now as well so we will defer future anticoagulation decisions or bridging to them. He sees them next month for repeat imaging and labs. He is having no medication access issues. We discussed ways to improve adherence. DVT Clinic is available as needed but no further follow up is planned at this time.   PLAN: -Continue apixaban (Eliquis) 5 mg twice daily. -Expected duration of therapy: per hematology. Therapy started on 08/18/22. -Patient educated on purpose, proper use and potential adverse effects of apixaban (Eliquis). -Discussed importance of taking medication around the same time every day. -Advised patient of medications to avoid (NSAIDs, aspirin doses >100 mg daily). -Educated that Tylenol (acetaminophen) is the preferred analgesic to lower the risk of bleeding. -Advised patient to alert all  providers of anticoagulation therapy prior to starting a new medication or having a procedure. -Emphasized importance of monitoring for signs and symptoms of bleeding (abnormal bruising, prolonged bleeding, nose bleeds, bleeding from gums, discolored urine, black tarry stools). -Educated patient to present to the ED if emergent signs and symptoms of new thrombosis occur.  Follow up: with PCP, hematology, and other oncology providers. DVT Clinic available as needed.   Pervis Hocking, PharmD, [place], CPP Deep Vein Thrombosis Clinic Clinical Pharmacist Practitioner Office: [phone]  I have evaluated the patient's chart/imaging and refer this patient to the Clinical Pharmacist Practitioner for medication management. I have reviewed the CPP's documentation and agree with her assessment and plan. I was immediately available during the visit for questions and collaboration.   Waverly Ferrari, MD

## 2022-10-02 NOTE — Patient Instructions (Signed)
-  Continue apixaban (Eliquis) 5 mg twice daily. -Your refills have been sent to your [facility] pharmacy. You may need to call the pharmacy to ask them to fill this when you start to run low on your current supply.  -Follow up with DVT Clinic as needed.  -It is important to take your medication around the same time every day.  -Avoid NSAIDs like ibuprofen (Advil, Motrin) and naproxen (Aleve) as well as aspirin doses over 100 mg daily. -Tylenol (acetaminophen) is the preferred over the counter pain medication to lower the risk of bleeding. -Be sure to alert all of your health care providers that you are taking an anticoagulant prior to starting a new medication or having a procedure. -Monitor for signs and symptoms of bleeding (abnormal bruising, prolonged bleeding, nose bleeds, bleeding from gums, discolored urine, black tarry stools). If you have fallen and hit your head OR if your bleeding is severe or not stopping, seek emergency care.  -Go to the emergency room if emergent signs and symptoms of new clot occur (new or worse swelling and pain in an arm or leg, shortness of breath, chest pain, fast or irregular heartbeats, lightheadedness, dizziness, fainting, coughing up blood) or if you experience a significant color change (pale or blue) in the extremity that has the DVT.  -We recommend you wear compression stockings and elevate your legs as long as you are having swelling or pain. Be sure to purchase the correct size and take them off at night.   If you have any questions or need to reschedule an appointment, please call [phone] [facility].  If you are having an emergency, call 911 or present to the nearest emergency room.   What is a DVT?  -Deep vein thrombosis (DVT) is a condition in which a blood clot forms in a vein of the deep venous system which can occur in the lower leg, thigh, pelvis, arm, or neck. This condition is serious and can be life-threatening if the clot travels  to the arteries of the lungs and causing a blockage (pulmonary embolism, PE). A DVT can also damage veins in the leg, which can lead to long-term venous disease, leg pain, swelling, discoloration, and ulcers or sores (post-thrombotic syndrome).  -Treatment may include taking an anticoagulant medication to prevent more clots from forming and the current clot from growing, wearing compression stockings, and/or surgical procedures to remove or dissolve the clot.

## 2022-10-04 ENCOUNTER — Other Ambulatory Visit: Payer: Self-pay

## 2022-10-04 ENCOUNTER — Encounter: Payer: Self-pay | Admitting: Internal Medicine

## 2022-10-04 ENCOUNTER — Ambulatory Visit: Payer: 59 | Attending: Internal Medicine | Admitting: Internal Medicine

## 2022-10-04 VITALS — BP 117/80 | HR 80 | Temp 97.9°F | Ht 74.0 in | Wt 205.0 lb

## 2022-10-04 DIAGNOSIS — M7989 Other specified soft tissue disorders: Secondary | ICD-10-CM

## 2022-10-04 DIAGNOSIS — E559 Vitamin D deficiency, unspecified: Secondary | ICD-10-CM | POA: Diagnosis not present

## 2022-10-04 DIAGNOSIS — Z1211 Encounter for screening for malignant neoplasm of colon: Secondary | ICD-10-CM

## 2022-10-04 DIAGNOSIS — E0590 Thyrotoxicosis, unspecified without thyrotoxic crisis or storm: Secondary | ICD-10-CM

## 2022-10-04 DIAGNOSIS — C119 Malignant neoplasm of nasopharynx, unspecified: Secondary | ICD-10-CM | POA: Diagnosis not present

## 2022-10-04 DIAGNOSIS — I10 Essential (primary) hypertension: Secondary | ICD-10-CM | POA: Diagnosis not present

## 2022-10-04 DIAGNOSIS — E042 Nontoxic multinodular goiter: Secondary | ICD-10-CM

## 2022-10-04 DIAGNOSIS — Z86711 Personal history of pulmonary embolism: Secondary | ICD-10-CM

## 2022-10-04 MED ORDER — VITAMIN D (ERGOCALCIFEROL) 1.25 MG (50000 UNIT) PO CAPS
50000.0000 [IU] | ORAL_CAPSULE | ORAL | 1 refills | Status: DC
Start: 2022-10-04 — End: 2022-10-23
  Filled 2022-10-04: qty 12, 84d supply, fill #0

## 2022-10-04 NOTE — Progress Notes (Addendum)
Patient ID: AMIEL MCCAFFREY, male    DOB: 03/27/1975  MRN: [id number]  CC: Follow-up (Follow up. Med refills /Discuss vit D /)   Subjective: Doran Nestle is a 47 y.o. male who presents for chronic ds management His concerns today include:  Hx of poorly controlled  HTN, preDM, tob dep, poor dentition, chronic lower back pain, history of reflex syncope, callus of left Achilles tendon.   Since last visit, patient's pathology results came back from the biopsy done on nasopharyngeal lesion.  This was positive for spindle cell CA.  Dr. Jenne Pane referred him to [facility] to an ENT specialist there Dr. Chestine Spore.  Patient states he spoke with her yesterday.  Plan is to do surgery laparoscopically in mid September to early October. -Dr. Bertis Ruddy has recommended that he remain on Eliquis uninterrupted for at least 3 months prior to proceeding with surgery otherwise bridging would be indicated. -Patient reports some problems with swallowing after blowing nose.  He hears a popping sound in his ears when he swallows. Weight today is 205 pounds.  Patient states that his weight prior to being employed with [employer] the early part of last year was 228.  Since then with increased activity at work, his weight has remained between 205 to 210 pounds.  Thyroid nodules: He had thyroid uptake study done 3 days ago.  Results not posted as yet by the radiologist.  Last TSH level was mildly low at 0.335.  Free T3 and free T4 were normal.  Denies any palpitations.  Vitamin D deficiency: Patient is interested in having his vitamin D level rechecked.  He has been taking high-dose vitamin D once a week.  Blood pressure today is good.  He reports compliance with blood pressure medications that include amlodipine 10 mg daily, carvedilol 25 mg twice a day, Cozaar 100 mg daily and spironolactone 50 mg daily. Patient Active Problem List   Diagnosis Date Noted   Pulmonary embolism on right (HCC) 09/24/2022   Nasal cavity  mass 09/24/2022   Acute deep vein thrombosis (DVT) of femoral vein of right lower extremity (HCC) 08/21/2022   Neck pain 08/14/2021   Prediabetes 01/28/2021   Vitamin D deficiency 01/28/2021   Pre-ulcerative corn or callous 02/01/2019   Disorder of left Achilles tendon 02/01/2019   Inguinal hernia of left side without obstruction or gangrene 12/08/2016   Former tobacco use 12/08/2016   Localized swelling, mass and lump, multiple sites 12/08/2016   Subcutaneous nodules 07/09/2016   Periodontitis 05/27/2015   Pain in left wrist 05/27/2015   HTN (hypertension) 05/12/2015     Current Outpatient Medications on File Prior to Visit  Medication Sig Dispense Refill   amLODipine (NORVASC) 10 MG tablet Take 1 tablet (10 mg total) by mouth daily. 90 tablet 1   carvedilol (COREG) 25 MG tablet Take 1 tablet (25 mg total) by mouth 2 (two) times daily with a meal. 180 tablet 1   clindamycin (CLEOCIN) 150 MG capsule Take 1 capsule (150 mg total) by mouth 3 (three) times daily. 21 capsule 0   losartan (COZAAR) 100 MG tablet Take 1 tablet (100 mg total) by mouth daily. 90 tablet 1   methocarbamol (ROBAXIN) 500 MG tablet Take 1 tablet (500 mg total) by mouth 2 (two) times daily as needed for muscle spasms. 30 tablet 0   spironolactone (ALDACTONE) 50 MG tablet Take 1 tablet (50 mg total) by mouth daily. 90 tablet 1   apixaban (ELIQUIS) 5 MG TABS tablet Take  1 tablet (5 mg total) by mouth 2 (two) times daily. Start taking after completion of starter pack. (Patient not taking: Reported on 10/04/2022) 60 tablet 5   sildenafil (VIAGRA) 100 MG tablet Take 0.5-1 tablets (50-100 mg total) by mouth daily as needed for erectile dysfunction. (Patient not taking: Reported on 10/02/2022) 10 tablet 11   Vitamin D, Ergocalciferol, (DRISDOL) 1.25 MG (50000 UNIT) CAPS capsule Take 1 capsule (50,000 Units total) by mouth every 7 (seven) days. (Patient not taking: Reported on 10/02/2022) 12 capsule 1   No current  facility-administered medications on file prior to visit.    Allergies  Allergen Reactions   Doxycycline Other (See Comments)    Possible allergen. Developed Bell's Palsy the day after taking antibiotic.   Penicillins Swelling and Rash    Has patient had a PCN reaction causing immediate rash, facial/tongue/throat swelling, SOB or lightheadedness with hypotension: YesYes Has patient had a PCN reaction causing severe rash involving mucus membranes or skin necrosis: NoNo Has patient had a PCN reaction that required hospitalization YesYes Has patient had a PCN reaction occurring within the last 10 years: NoNo If all of the above answers are "NO", then may proceed with Cephalospori    Social History   Socioeconomic History   Marital status: Single    Spouse name: Not on file   Number of children: 3   Years of education: Not on file   Highest education level: Not on file  Occupational History   Occupation: manual labor  Tobacco Use   Smoking status: Former    Current packs/day: 0.00    Types: Cigars, Cigarettes    Quit date: 05/10/2015    Years since quitting: 7.4   Smokeless tobacco: Never  Vaping Use   Vaping status: Never Used  Substance and Sexual Activity   Alcohol use: Yes    Comment: occ   Drug use: Yes    Types: Marijuana   Sexual activity: Not on file  Other Topics Concern   Not on file  Social History Narrative   Not on file   Social Determinants of Health   Financial Resource Strain: Low Risk  (05/31/2022)   Overall Financial Resource Strain (CARDIA)    Difficulty of Paying Living Expenses: Not very hard  Food Insecurity: No Food Insecurity (05/31/2022)   Hunger Vital Sign    Worried About Running Out of Food in the Last Year: Never true    Ran Out of Food in the Last Year: Never true  Transportation Needs: No Transportation Needs (05/31/2022)   PRAPARE - Administrator, Civil Service (Medical): No    Lack of Transportation (Non-Medical): No   Physical Activity: Unknown (08/07/2022)   Exercise Vital Sign    Days of Exercise per Week: 5 days    Minutes of Exercise per Session: Patient declined  Recent Concern: Physical Activity - Inactive (05/31/2022)   Exercise Vital Sign    Days of Exercise per Week: 0 days    Minutes of Exercise per Session: 0 min  Stress: No Stress Concern Present (08/07/2022)   [employer] of Occupational Health - Occupational Stress Questionnaire    Feeling of Stress : Only a little  Social Connections: Unknown (08/07/2022)   Social Connection and Isolation Panel [NHANES]    Frequency of Communication with Friends and Family: Twice a week    Frequency of Social Gatherings with Friends and Family: Twice a week    Attends Religious Services: Never    Active Member  of Clubs or Organizations: No    Attends Banker Meetings: Never    Marital Status: Patient declined  Recent Concern: Social Connections - Moderately Isolated (05/31/2022)   Social Connection and Isolation Panel [NHANES]    Frequency of Communication with Friends and Family: Twice a week    Frequency of Social Gatherings with Friends and Family: Twice a week    Attends Religious Services: Never    Database administrator or Organizations: No    Attends Banker Meetings: Never    Marital Status: Living with partner  Intimate Partner Violence: Not At Risk (05/31/2022)   Humiliation, Afraid, Rape, and Kick questionnaire    Fear of Current or Ex-Partner: No    Emotionally Abused: No    Physically Abused: No    Sexually Abused: No    Family History  Problem Relation Age of Onset   Cancer Mother        breast cancer    Past Surgical History:  Procedure Laterality Date   CHOLECYSTECTOMY      ROS: Review of Systems Negative except as stated above  PHYSICAL EXAM: BP 117/80 (BP Location: Left Arm, Patient Position: Sitting, Cuff Size: Normal)   Pulse 80   Temp 97.9 F (36.6 C) (Oral)   Ht 6\' 2"  (1.88 m)    Wt 205 lb (93 kg)   SpO2 97%   BMI 26.32 kg/m   Wt Readings from Last 3 Encounters:  10/04/22 205 lb (93 kg)  09/24/22 205 lb 3.2 oz (93.1 kg)  09/04/22 211 lb (95.7 kg)    Physical Exam   General appearance - alert, well appearing, and in no distress Mental status - normal mood, behavior, speech, dress, motor activity, and thought processes Neck -mild fullness of the thyroid gland.  Less than pea size soft movable nodule palpated below the left pinna.  Unchanged from last exam. Chest - clear to auscultation, no wheezes, rales or rhonchi, symmetric air entry Heart - normal rate, regular rhythm, normal S1, S2, no murmurs, rubs, clicks or gallops     Latest Ref Rng & Units 08/18/2022    9:26 PM 08/13/2022   11:30 AM 04/06/2022    9:13 AM  CMP  Glucose 70 - 99 mg/dL 161  096  045   BUN 6 - 20 mg/dL 15  12  14    Creatinine 0.61 - 1.24 mg/dL 4.09  8.11  9.14   Sodium 135 - 145 mmol/L 135  139  135   Potassium 3.5 - 5.1 mmol/L 4.3  4.4  4.1   Chloride 98 - 111 mmol/L 99  100  101   CO2 22 - 32 mmol/L 28  24  25    Calcium 8.9 - 10.3 mg/dL 8.7  9.2  9.1    Lipid Panel     Component Value Date/Time   CHOL 129 10/19/2020 1136   TRIG 114 10/19/2020 1136   HDL 39 (L) 10/19/2020 1136   CHOLHDL 3.3 10/19/2020 1136   LDLCALC 69 10/19/2020 1136    CBC    Component Value Date/Time   WBC 7.3 08/18/2022 2126   RBC 5.09 08/18/2022 2126   HGB 14.9 08/18/2022 2126   HGB 17.4 04/05/2022 1010   HCT 43.0 08/18/2022 2126   HCT 51.9 (H) 04/05/2022 1010   PLT 139 (L) 08/18/2022 2126   PLT 285 04/05/2022 1010   MCV 84.5 08/18/2022 2126   MCV 86 04/05/2022 1010   MCH 29.3 08/18/2022 2126  MCHC 34.7 08/18/2022 2126   RDW 14.1 08/18/2022 2126   RDW 12.9 04/05/2022 1010   LYMPHSABS 3.2 08/18/2022 2126   LYMPHSABS 2.7 04/05/2022 1010   MONOABS 0.5 08/18/2022 2126   EOSABS 0.2 08/18/2022 2126   EOSABS 0.1 04/05/2022 1010   BASOSABS 0.0 08/18/2022 2126   BASOSABS 0.0 04/05/2022 1010     ASSESSMENT AND PLAN: 1. Multiple thyroid nodules Await results of thyroid uptake scan.  Recheck thyroid chemistry today - TSH+T4F+T3Free  2. Vitamin D deficiency Continue high-dose vitamin D once a week.  Recheck vitamin D level today - Vitamin D, Ergocalciferol, (DRISDOL) 1.25 MG (50000 UNIT) CAPS capsule; Take 1 capsule (50,000 Units total) by mouth every 7 (seven) days.  Dispense: 12 capsule; Refill: 1 - VITAMIN D 25 Hydroxy (Vit-D Deficiency, Fractures)  3. Essential hypertension At goal. Continue  amlodipine 10 mg daily, carvedilol 25 mg twice a day, Cozaar 100 mg daily and spironolactone 50 mg daily.  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) Plan for laparoscopic surgery at [facility] sometime in September or early October per patient.  Surgeon has decided to wait until he has been on the Eliquis uninterrupted for at least 3 months before stopping it to perform surgery.  5. Screening for colon cancer Discussed screening methods.  He prefers Cologuard test.  6. Mass of soft tissue of face I think this may be a cyst small lymph node which we will observe for now  7. History of pulmonary embolism Continue Eliquis   Addendum: TSH came back at 0.2.  Continues to trend down.  Free T3 and free T4 normal.  Will add TSI.  Patient was given the opportunity to ask questions.  Patient verbalized understanding of the plan and was able to repeat key elements of the plan.   This documentation was completed using Paediatric nurse.  Any transcriptional errors are unintentional.  No orders of the defined types were placed in this encounter.    Requested Prescriptions   Pending Prescriptions Disp Refills   Vitamin D, Ergocalciferol, (DRISDOL) 1.25 MG (50000 UNIT) CAPS capsule 12 capsule 1    Sig: Take 1 capsule (50,000 Units total) by mouth every 7 (seven) days.    No follow-ups on file.  Jonah Blue, MD, FACP

## 2022-10-07 ENCOUNTER — Other Ambulatory Visit: Payer: Self-pay | Admitting: Internal Medicine

## 2022-10-07 DIAGNOSIS — E041 Nontoxic single thyroid nodule: Secondary | ICD-10-CM

## 2022-10-07 DIAGNOSIS — E0590 Thyrotoxicosis, unspecified without thyrotoxic crisis or storm: Secondary | ICD-10-CM

## 2022-10-08 NOTE — Addendum Note (Signed)
Addended by: Jonah Blue B on: 10/08/2022 01:38 PM   Modules accepted: Orders

## 2022-10-09 ENCOUNTER — Telehealth: Payer: Self-pay | Admitting: Internal Medicine

## 2022-10-09 ENCOUNTER — Other Ambulatory Visit: Payer: Self-pay

## 2022-10-09 LAB — SPECIMEN STATUS REPORT

## 2022-10-09 LAB — THYROID STIMULATING IMMUNOGLOBULIN

## 2022-10-09 MED ORDER — METHIMAZOLE 10 MG PO TABS
10.0000 mg | ORAL_TABLET | Freq: Every day | ORAL | 1 refills | Status: DC
  Filled 2022-10-09: qty 30, 30d supply, fill #0
  Filled 2022-11-12 – 2022-11-23 (×3): qty 30, 30d supply, fill #1

## 2022-10-09 NOTE — Telephone Encounter (Signed)
Phone call placed to interventional radiology yesterday morning.  I spoke with Dr. Fredia Sorrow.  Inquired whether patient would need to be off Eliquis for FNA of the thyroid nodules.  He states that they do not require patients to be off Eliquis given that it is a superficial biopsy.  He was able to look at patient's images.  He states that the nodules are pretty low suspicion with cystic components but meet criteria for biopsy based on size.  Should patient decide against FNA, option of repeat US in 1 yr is also an option.  I called and spoke with patient yesterday afternoon.  Informed him that stopping the Eliquis is not required for the FNA.  However erring on the side of caution, it would be good if we were able to hold the Eliquis for the procedure nonetheless just to prevent any complications prior to his more pressing issue which is surgery for the nasopharyngeal mass.  I think we should hold off until we can hold the Eliquis for at least 2 days which we can do either before or after surgery for the nasopharyngeal cancer depending on when it is scheduled.  His TSH continues to decline while free T3 and free T4 remain normal.  I have added TSI to the recent labs and await the results of that.  Inquired whether he has had any diarrhea, palpitations or feeling hot all the time.  He has had some weight fluctuations.  He reports diarrhea off and on.  No palpitations but does feel hot a lot.  I recommend that we start a low-dose of Tapazole and recheck levels in several weeks.  Patient then stated that he will be bringing a form for work accommodation.  States that since diagnosis of PE and nasopharyngeal CA, he gets a little bit more fatigue 2 hours into his shift.  He works for [employer].  Works 4 to 5 hours a day and does a lot of walking and lifting.  They get one 15-minute break during their shift.  He regularly would have to asked supervisor to sit down for about 10 minutes each shift.  They requested that he  fill out a work accommodation form.  Patient states he would like to request besides the given 15-minute break per shift, at least another 10-minute break per shift.  In terms of lifting, he states he lives anywhere from 25 to 75 pounds and is comfortable with that.

## 2022-10-16 DIAGNOSIS — Z0289 Encounter for other administrative examinations: Secondary | ICD-10-CM

## 2022-10-18 ENCOUNTER — Other Ambulatory Visit: Payer: Self-pay

## 2022-10-19 ENCOUNTER — Other Ambulatory Visit: Payer: Self-pay

## 2022-10-19 ENCOUNTER — Telehealth: Payer: 59 | Admitting: Nurse Practitioner

## 2022-10-19 DIAGNOSIS — G8929 Other chronic pain: Secondary | ICD-10-CM | POA: Diagnosis not present

## 2022-10-19 DIAGNOSIS — M5441 Lumbago with sciatica, right side: Secondary | ICD-10-CM | POA: Diagnosis not present

## 2022-10-19 MED ORDER — METHOCARBAMOL 500 MG PO TABS
500.0000 mg | ORAL_TABLET | Freq: Two times a day (BID) | ORAL | 0 refills | Status: DC | PRN
Start: 2022-10-19 — End: 2022-11-03

## 2022-10-19 NOTE — Progress Notes (Signed)
Virtual Visit Consent   [name], you are scheduled for a virtual visit with a [facility] provider today. Just as with appointments in the office, your consent must be obtained to participate. Your consent will be active for this visit and any virtual visit you may have with one of our providers in the next 365 days. If you have a MyChart account, a copy of this consent can be sent to you electronically.  As this is a virtual visit, video technology does not allow for your provider to perform a traditional examination. This may limit your provider's ability to fully assess your condition. If your provider identifies any concerns that need to be evaluated in person or the need to arrange testing (such as labs, EKG, etc.), we will make arrangements to do so. Although advances in technology are sophisticated, we cannot ensure that it will always work on either your end or our end. If the connection with a video visit is poor, the visit may have to be switched to a telephone visit. With either a video or telephone visit, we are not always able to ensure that we have a secure connection.  By engaging in this virtual visit, you consent to the provision of healthcare and authorize for your insurance to be billed (if applicable) for the services provided during this visit. Depending on your insurance coverage, you may receive a charge related to this service.  I need to obtain your verbal consent now. Are you willing to proceed with your visit today? [name] has provided verbal consent on 10/19/2022 for a virtual visit (video or telephone). [name] Simas, FNP  Date: 10/19/2022 9:01 AM  Virtual Visit via Video Note   I, [name], connected with  [name]  ([id number], 1975/12/17) on 10/19/22 at  9:00 AM EDT by a video-enabled telemedicine application and verified that I am speaking with the correct person using two identifiers.  Location: Patient: Virtual Visit Location Patient:  Home Provider: Virtual Visit Location Provider: Home Office   I discussed the limitations of evaluation and management by telemedicine and the availability of in person appointments. The patient expressed understanding and agreed to proceed.    History of Present Illness: [name] is a 47 y.o. who identifies as a male who was assigned male at birth, and is being seen today for recurrent back pain.  He feels he "tweaked" his back and that has caused him to have back spasms.  This has happened in the past   He tried to wear his back brace overnight  Woke up still in pain today   PCP is aware and he has had treatment and imaging in the past   Has a prescription for robaxin in the past  Also used tylenol for pain relief   Xray results from 04/26/22: Narrative & Impression  CLINICAL DATA:  Low back pain. Right leg sciatica. Chronic bilateral low back pain with right-sided sciatica.   EXAM: LUMBAR SPINE - COMPLETE 4+ VIEW   COMPARISON:  Lumbar radiograph 08/16/2016   FINDINGS: There are 5 non-rib-bearing lumbar vertebra. Enlarged L5 transverse process with pseudoarticulation with the sacrum. Lumbar alignment is normal. Mild L4-L5 disc space narrowing and spurring. Anterior spurring at additional levels with preservation of disc spaces. No evidence of fracture, pars defects or focal bone abnormalities. The sacroiliac joints are congruent. Bilateral sacroiliac joint sclerosis, more prominent on the left.   IMPRESSION: 1. Mild L4-L5 degenerative disc disease. 2. Bilateral sacroiliac joint  sclerosis, more prominent on the left, favor degenerative change. 3. Enlarged left L5 transverse process and pseudoarticulation with the sacrum, typically incidental.     Electronically Signed   By: Narda Rutherford M.D.   On: 04/30/2022 17:24   Problems:  Patient Active Problem List   Diagnosis Date Noted   Pulmonary embolism on right (HCC) 09/24/2022   Nasal cavity mass  09/24/2022   Acute deep vein thrombosis (DVT) of femoral vein of right lower extremity (HCC) 08/21/2022   Neck pain 08/14/2021   Prediabetes 01/28/2021   Vitamin D deficiency 01/28/2021   Pre-ulcerative corn or callous 02/01/2019   Disorder of left Achilles tendon 02/01/2019   Inguinal hernia of left side without obstruction or gangrene 12/08/2016   Former tobacco use 12/08/2016   Localized swelling, mass and lump, multiple sites 12/08/2016   Subcutaneous nodules 07/09/2016   Periodontitis 05/27/2015   Pain in left wrist 05/27/2015   HTN (hypertension) 05/12/2015      Medications:  Current Outpatient Medications:    amLODipine (NORVASC) 10 MG tablet, Take 1 tablet (10 mg total) by mouth daily., Disp: 90 tablet, Rfl: 1   apixaban (ELIQUIS) 5 MG TABS tablet, Take 1 tablet (5 mg total) by mouth 2 (two) times daily. Start taking after completion of starter pack. (Patient not taking: Reported on 10/04/2022), Disp: 60 tablet, Rfl: 5   carvedilol (COREG) 25 MG tablet, Take 1 tablet (25 mg total) by mouth 2 (two) times daily with a meal., Disp: 180 tablet, Rfl: 1   clindamycin (CLEOCIN) 150 MG capsule, Take 1 capsule (150 mg total) by mouth 3 (three) times daily., Disp: 21 capsule, Rfl: 0   losartan (COZAAR) 100 MG tablet, Take 1 tablet (100 mg total) by mouth daily., Disp: 90 tablet, Rfl: 1   methimazole (TAPAZOLE) 10 MG tablet, Take 1 tablet (10 mg total) by mouth daily., Disp: 30 tablet, Rfl: 1   methocarbamol (ROBAXIN) 500 MG tablet, Take 1 tablet (500 mg total) by mouth 2 (two) times daily as needed for muscle spasms., Disp: 30 tablet, Rfl: 0   sildenafil (VIAGRA) 100 MG tablet, Take 0.5-1 tablets (50-100 mg total) by mouth daily as needed for erectile dysfunction. (Patient not taking: Reported on 10/02/2022), Disp: 10 tablet, Rfl: 11   spironolactone (ALDACTONE) 50 MG tablet, Take 1 tablet (50 mg total) by mouth daily., Disp: 90 tablet, Rfl: 1   Vitamin D, Ergocalciferol, (DRISDOL) 1.25 MG  (50000 UNIT) CAPS capsule, Take 1 capsule (50,000 Units total) by mouth every 7 (seven) days., Disp: 12 capsule, Rfl: 1  Observations/Objective: Patient is well-developed, well-nourished in no acute distress.  Resting comfortably  at home.  Head is normocephalic, atraumatic.  No labored breathing.  Speech is clear and coherent with logical content.  Patient is alert and oriented at baseline.    Assessment and Plan:  1. Chronic bilateral low back pain with right-sided sciatica Meds ordered this encounter  Medications   methocarbamol (ROBAXIN) 500 MG tablet    Sig: Take 1 tablet (500 mg total) by mouth 2 (two) times daily as needed for muscle spasms.    Dispense:  30 tablet    Refill:  0      Follow up with PCP if pain persists or with new concerns  (Numbness/weakness)  Work note for today provided   May continue tylenol for pain relief   Follow Up Instructions: I discussed the assessment and treatment plan with the patient. The patient was provided an opportunity to ask questions and  all were answered. The patient agreed with the plan and demonstrated an understanding of the instructions.  A copy of instructions were sent to the patient via MyChart unless otherwise noted below.    The patient was advised to call back or seek an in-person evaluation if the symptoms worsen or if the condition fails to improve as anticipated.  Time:  I spent 10 minutes with the patient via telehealth technology discussing the above problems/concerns.    [name] Simas, FNP

## 2022-10-20 ENCOUNTER — Telehealth: Payer: 59 | Admitting: Physician Assistant

## 2022-10-20 DIAGNOSIS — G8929 Other chronic pain: Secondary | ICD-10-CM | POA: Diagnosis not present

## 2022-10-20 DIAGNOSIS — M5441 Lumbago with sciatica, right side: Secondary | ICD-10-CM

## 2022-10-20 NOTE — Patient Instructions (Signed)
  [name], thank you for joining Tylene Fantasia Ward, PA-C for today's virtual visit.  While this provider is not your primary care provider (PCP), if your PCP is located in our provider database this encounter information will be shared with them immediately following your visit.   A [facility] MyChart account gives you access to today's visit and all your visits, tests, and labs performed at [facility] " click here if you don't have a [facility] MyChart account or go to [email or web address]  Consent: (Patient) [name] provided verbal consent for this virtual visit at the beginning of the encounter.  Current Medications:  Current Outpatient Medications:    amLODipine (NORVASC) 10 MG tablet, Take 1 tablet (10 mg total) by mouth daily., Disp: 90 tablet, Rfl: 1   apixaban (ELIQUIS) 5 MG TABS tablet, Take 1 tablet (5 mg total) by mouth 2 (two) times daily. Start taking after completion of starter pack. (Patient not taking: Reported on 10/04/2022), Disp: 60 tablet, Rfl: 5   carvedilol (COREG) 25 MG tablet, Take 1 tablet (25 mg total) by mouth 2 (two) times daily with a meal., Disp: 180 tablet, Rfl: 1   clindamycin (CLEOCIN) 150 MG capsule, Take 1 capsule (150 mg total) by mouth 3 (three) times daily., Disp: 21 capsule, Rfl: 0   losartan (COZAAR) 100 MG tablet, Take 1 tablet (100 mg total) by mouth daily., Disp: 90 tablet, Rfl: 1   methimazole (TAPAZOLE) 10 MG tablet, Take 1 tablet (10 mg total) by mouth daily., Disp: 30 tablet, Rfl: 1   methocarbamol (ROBAXIN) 500 MG tablet, Take 1 tablet (500 mg total) by mouth 2 (two) times daily as needed for muscle spasms., Disp: 30 tablet, Rfl: 0   sildenafil (VIAGRA) 100 MG tablet, Take 0.5-1 tablets (50-100 mg total) by mouth daily as needed for erectile dysfunction. (Patient not taking: Reported on 10/02/2022), Disp: 10 tablet, Rfl: 11   spironolactone (ALDACTONE) 50 MG tablet, Take 1 tablet (50 mg total) by mouth daily., Disp: 90  tablet, Rfl: 1   Vitamin D, Ergocalciferol, (DRISDOL) 1.25 MG (50000 UNIT) CAPS capsule, Take 1 capsule (50,000 Units total) by mouth every 7 (seven) days., Disp: 12 capsule, Rfl: 1   Medications ordered in this encounter:  No orders of the defined types were placed in this encounter.    *If you need refills on other medications prior to your next appointment, please contact your pharmacy*  Follow-Up: Call back or seek an in-person evaluation if the symptoms worsen or if the condition fails to improve as anticipated.  [facility] [phone]  Other Instructions Recommend continued supportive care, Ice, tylenol, muscle relaxer.  Follow up with PCP as scheduled.  Work note provided.    If you have been instructed to have an in-person evaluation today at a local Urgent Care facility, please use the link below. It will take you to a list of all of our available [facility], including address, phone number and hours of operation. Please do not delay care.  [facility]  If you or a family member do not have a primary care provider, use the link below to schedule a visit and establish care. When you choose a [facility] primary care physician or advanced practice provider, you gain a long-term partner in health. Find a Primary Care Provider  Learn more about [facility] in-office and virtual care options: [facility] Get Care Now

## 2022-10-20 NOTE — Progress Notes (Signed)
Virtual Visit Consent   [name], you are scheduled for a virtual visit with a [facility] provider today. Just as with appointments in the office, your consent must be obtained to participate. Your consent will be active for this visit and any virtual visit you may have with one of our providers in the next 365 days. If you have a MyChart account, a copy of this consent can be sent to you electronically.  As this is a virtual visit, video technology does not allow for your provider to perform a traditional examination. This may limit your provider's ability to fully assess your condition. If your provider identifies any concerns that need to be evaluated in person or the need to arrange testing (such as labs, EKG, etc.), we will make arrangements to do so. Although advances in technology are sophisticated, we cannot ensure that it will always work on either your end or our end. If the connection with a video visit is poor, the visit may have to be switched to a telephone visit. With either a video or telephone visit, we are not always able to ensure that we have a secure connection.  By engaging in this virtual visit, you consent to the provision of healthcare and authorize for your insurance to be billed (if applicable) for the services provided during this visit. Depending on your insurance coverage, you may receive a charge related to this service.  I need to obtain your verbal consent now. Are you willing to proceed with your visit today? [name] has provided verbal consent on 10/20/2022 for a virtual visit (video or telephone). Tylene Fantasia Ward, PA-C  Date: 10/20/2022 11:01 AM  Virtual Visit via Video Note   I, Tylene Fantasia Ward, connected with  [name]  ([id number], [birth year]) on 10/20/22 at 10:45 AM EDT by a video-enabled telemedicine application and verified that I am speaking with the correct person using two identifiers.  Location: Patient: Virtual Visit Location Patient:  Home Provider: Virtual Visit Location Provider: Home   I discussed the limitations of evaluation and management by telemedicine and the availability of in person appointments. The patient expressed understanding and agreed to proceed.    History of Present Illness: [name] is a 47 y.o. who identifies as a male who was assigned male at birth, and is being seen today for persistent lower back pain.  Reports he had a vv a few days ago.  He reports at that time he was given a work note, requesting a new work note due to persistent pain.  He is taking tylenol and robaxin with minimal relief.  No new sx at this time.  No weakness, saddle anesthesia, loss of bowel or bladder control.  No new injury.  He has an upcoming appointment with PCP.  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 Start taking after completion of starter pack. (Patient not taking: Reported on 10/04/2022), Disp: 60 tablet, Rfl: 5   carvedilol (COREG) 25 MG tablet, Take 1 tablet (25 mg total) by mouth  2 (two) times daily with a meal., Disp: 180 tablet, Rfl: 1   clindamycin (CLEOCIN) 150 MG capsule, Take 1 capsule (150 mg total) by mouth 3 (three) times daily., Disp: 21 capsule, Rfl: 0   losartan (COZAAR) 100 MG tablet, Take 1 tablet (100 mg total) by mouth daily., Disp: 90 tablet, Rfl: 1   methimazole (TAPAZOLE) 10 MG tablet, Take 1 tablet (10 mg total) by mouth daily., Disp: 30 tablet, Rfl: 1   methocarbamol (ROBAXIN) 500 MG tablet, Take 1 tablet (500 mg total) by mouth 2 (two) times daily as needed for muscle spasms., Disp: 30 tablet, Rfl: 0   sildenafil (VIAGRA) 100 MG tablet, Take 0.5-1 tablets (50-100 mg total) by mouth daily as needed for erectile dysfunction. (Patient not taking: Reported on 10/02/2022), Disp: 10 tablet, Rfl: 11   spironolactone (ALDACTONE) 50 MG tablet, Take 1 tablet (50 mg total) by mouth daily., Disp: 90 tablet, Rfl: 1   Vitamin D, Ergocalciferol, (DRISDOL) 1.25 MG (50000 UNIT) CAPS capsule, Take 1 capsule (50,000 Units total) by mouth every 7 (seven) days., Disp: 12 capsule, Rfl: 1  Observations/Objective: Patient is well-developed, well-nourished in no acute distress.  Resting comfortably at home.  Head is normocephalic, atraumatic.  No labored breathing.  Speech is clear and coherent with logical content.  Patient is alert and oriented at baseline.    Assessment and Plan: 1. Chronic bilateral low back pain with right-sided sciatica  Work note given.  Advised continued supportive care.  F/u with PCP  Follow Up Instructions: I discussed the assessment and treatment plan with the patient. The patient was provided an opportunity to ask questions and all were answered. The patient agreed with the plan and demonstrated an understanding of the instructions.  A copy of instructions were sent to the patient via MyChart unless otherwise noted below.     The patient was advised to call back or seek an in-person evaluation if the symptoms worsen or if the condition  fails to improve as anticipated.  Time:  I spent 10 minutes with the patient via telehealth technology discussing the above problems/concerns.    Tylene Fantasia Ward, PA-C

## 2022-10-22 ENCOUNTER — Other Ambulatory Visit: Payer: Self-pay

## 2022-10-23 ENCOUNTER — Ambulatory Visit (INDEPENDENT_AMBULATORY_CARE_PROVIDER_SITE_OTHER): Payer: Self-pay | Admitting: Neurology

## 2022-10-23 ENCOUNTER — Encounter: Payer: Self-pay | Admitting: Neurology

## 2022-10-23 VITALS — BP 125/82 | HR 73 | Ht 74.0 in | Wt 208.0 lb

## 2022-10-23 DIAGNOSIS — C119 Malignant neoplasm of nasopharynx, unspecified: Secondary | ICD-10-CM

## 2022-10-23 DIAGNOSIS — Z8522 Personal history of malignant neoplasm of nasal cavities, middle ear, and accessory sinuses: Secondary | ICD-10-CM | POA: Insufficient documentation

## 2022-10-23 NOTE — Progress Notes (Signed)
[id number]    Provider:  Dr [name] Requesting Provider: [name], MD Primary Care Provider:  [name], MD  CC:  Bells palsy  [place] came in to say thank you, his surgery is upcoming for his nasal mass, lovely gentleman  09/2022: [name] REPORT     STUDY DATE: 09/15/2022 PATIENT NAME: [name] DOB: Aug 06, 1975 MRN: [id number]   EXAM: MRI Brain without contrast   ORDERING CLINICIAN: Naomie Dean, MD CLINICAL HISTORY: 47 year old man with right-sided Bell's palsy and a nasal cavity mass COMPARISON FILMS: CT head 06/22/2014 and CT maxillofacial 08/15/2022   TECHNIQUE: MRI of the brain without contrast was obtained utilizing 5 mm axial slices with T1, T2, T2 flair, SWI and diffusion weighted views.  T1 sagittal and T2 coronal views were obtained. CONTRAST: none (the study was ordered with and without the due to claustrophobia the study ended before contrast) IMAGING SITE: [facility], [address] [place], [place]   FINDINGS: On sagittal images, the spinal cord is imaged caudally to C3 and is normal in caliber.   The contents of the posterior fossa are of normal size and position.   The pituitary gland and optic chiasm appear normal.    Brain volume appears normal.   The ventricles are normal in size and without distortion.  There are no abnormal extra-axial collections of fluid.     The cerebellum and brainstem appears normal.   The deep gray matter appears normal.   There are several punctate T2/FLAIR hypertense foci in the subcortical or deep white matter of the cerebral hemispheres.  None of these appear to be acute.  Diffusion weighted images are normal.  Susceptibility weighted images are normal.     There is a 43 x 27 x 49 mm (AP x transverse x craniocaudal) mass in the right nasopharynx, unchanged compared to the CT scan from 08/15/2022 but increase in size compared to the CT scan from 06/22/2014.  The mass occludes the right  ostiomeatal complex and distal to the right maxillary sinus.  There is an air-fluid level in the right maxillary sinus, not present on the CT scan from 08/15/2022.  One of the right ethmoid air cells is opacified.  There is minimal mucoperiosteal thickening of the frontal recesses though the frontal sinuses appear normal.  The sphenoid sinus and left maxillary sinus appear normal..      The orbits appear normal.   The VIIth/VIIIth nerve complex appears normal (in this noncontrast study).  The mastoid air cells appear normal.  Flow voids are identified within the major intracerebral arteries.   Due to claustrophobia, the study ended before contrasted images could be obtained, limiting interpretation of the facial nerves.     IMPRESSION: This MRI of the brain without contrast shows the following: Few scattered T2/FLAIR hyperintense foci in the cerebral hemispheres are most consistent with very minimal chronic microvascular ischemic change.  Not appear to be acute and these are unlikely to be symptomatic. 43 x 27 x 49 mm mass in the right nasopharynx seen on previous studies.  This is worrisome for neoplasm.  If not already done so recommend ENT further evaluation. In this noncontrasted study, the right facial nerve appeared normal though inflammation of a Bell's palsy cannot be assessed without contrast.  HPI:  [name] is a 47 y.o. male here as requested by [name] Matar, MD for Bells palsy. PMH has HTN (hypertension); Periodontitis; Pain in left wrist; Subcutaneous nodules; Inguinal hernia of left side  without obstruction or gangrene; Former tobacco use; Localized swelling, mass and lump, multiple sites; Pre-ulcerative corn or callous; Disorder of left Achilles tendon; Prediabetes; Vitamin D deficiency; Neck pain; Acute deep vein thrombosis (DVT) of femoral vein of right lower extremity (HCC); Pulmonary embolism on right [facility]); and Nasal cavity mass on their problem list. I reviewed Dr.  Felipa [name] notes, seen at the ED 07/29/22 for Bell's palsy and sinusitis.  He was given doxycycline earlier prior to ED presentation but noticed drooping of the right side of his face while driving to work, he received prescription for Valtrex, prednisone, and the clindamycin at the ED.  He was advised to follow-up with neurology, he has pain on the right side of his face 7 is 10 described as discomfort, also otalgia and difficulty eating and drinking, he still in his dose of prednisone and clindamycin but has completed the Valtrex, he has difficulty shutting his right eye.   I reviewed his history and in 2016 there was a polypoid opacity in the upper right nasal cavity with loss of the upper bony nasal septum 15 mm x 9 mm at that time there is no right anterior cranial fossa erosion and no sinusitis but he was recommended to go to ENT.  I reviewed notes in epic and in care everywhere and did not see any ENT notes.  It does look as though he saw neurosurgery in 2017 but I cannot access that note. His ear was bothering him Thursday and Friday he went to the mobile bus on the 16th, she looked in his ear and said it was read and looked like an ear infection. She c=gave him doxycycline and the net day he noticed he couldn't blink. Sore ness in the ear, no hyperacusis. Like aching. He then noticed the facial droop. The next day he went to the ED. He is miserable, drooling right side of the mouth. He was given instructions, got steroids and valtrex and clindamycin. His ear still hurts, if he swallows he can hear it pop. His face hurts, he still has congestion. Tearing eye. Can close right eye but decsresed blink    Reviewed notes, labs and imaging from outside physicians, which showed:  06/22/2014: CLINICAL DATA:  Headache around the temples for 4 days.   EXAM: CT HEAD WITHOUT CONTRAST   TECHNIQUE: Contiguous axial images were obtained from the base of the skull through the vertex without intravenous  contrast.   COMPARISON:  None.   FINDINGS: Skull and Sinuses:There is a polypoid opacity in the upper right nasal cavity with loss of the upper bony nasal septum. The structure measures 15 mm in height by 9 mm in maximal thickness. No erosion of the anterior cranial fossa floor. No sinusitis.   Orbits: No acute abnormality.   Brain: No evidence of acute infarction, hemorrhage, hydrocephalus, or mass lesion/mass effect.   IMPRESSION: 1. Negative intracranial imaging. 2. 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 Recommend ENT referral for visualization.  04/27/2022: hgba1c 5.9, vitamin D 14,  11/2021: tsh, ft4 normal     Latest Ref Rng & Units 08/18/2022    9:26 PM 04/06/2022    9:13 AM 04/05/2022   10:10 AM  CBC  WBC 4.0 - 10.5 K/uL 7.3  5.9  6.4   Hemoglobin 13.0 - 17.0 g/dL 16.1  09.6  04.5   Hematocrit 39.0 - 52.0 % 43.0  49.5  51.9   Platelets 150 - 400 K/uL 139  268  285        Latest Ref Rng & Units 08/18/2022    9:26 PM 08/13/2022   11:30 AM 04/06/2022    9:13 AM  CMP  Glucose 70 - 99 mg/dL 102  725  366   BUN 6 - 20 mg/dL 15  12  14    Creatinine 0.61 - 1.24 mg/dL 4.40  3.47  4.25   Sodium 135 - 145 mmol/L 135  139  135   Potassium 3.5 - 5.1 mmol/L 4.3  4.4  4.1   Chloride 98 - 111 mmol/L 99  100  101   CO2 22 - 32 mmol/L 28  24  25    Calcium 8.9 - 10.3 mg/dL 8.7  9.2  9.1      Recent Results (from the past 2160 hour(s))  Exercise Tolerance Test     Status: None   Collection Time: 07/30/22  3:32 PM  Result Value Ref Range   Rest HR 93.0 bpm   Rest BP 129/95 mmHg   Exercise duration (min) 8 min   Exercise duration (sec) 16 sec   Estimated workload 10.1    Peak HR 148 bpm   Peak BP 223/106 mmHg   MPHR 172 bpm   Percent HR 86.0 %   Angina Index 0    Duke Treadmill Score 8    ST Depression (mm) 0 mm  HIV Antibody (routine testing w rflx)     Status: None   Collection Time: 08/13/22 11:30 AM  Result Value Ref Range   HIV Screen  4th Generation wRfx Non Reactive Non Reactive    Comment: HIV-1/HIV-2 antibodies and HIV-1 p24 antigen were NOT detected. There is no laboratory evidence of HIV infection. HIV Negative   Sjogren's syndrome antibods(ssa + ssb)     Status: None   Collection Time: 08/13/22 11:30 AM  Result Value Ref Range   ENA SSA (RO) Ab <0.2 0.0 - 0.9 AI   ENA SSB (LA) Ab <0.2 0.0 - 0.9 AI  Angiotensin converting enzyme     Status: None   Collection Time: 08/13/22 11:30 AM  Result Value Ref Range   Angio Convert Enzyme 45 14 - 82 U/L  Lyme Disease Serology w/Reflex     Status: None   Collection Time: 08/13/22 11:30 AM  Result Value Ref Range   Lyme Total Antibody EIA Negative Negative    Comment: Lyme antibodies not detected. Reflex testing is not indicated. No laboratory evidence of infection with B. burgdorferi (Lyme disease). Negative results may occur in patients recently infected (less than or equal to 14 days) with B. burgdorferi.  If recent infection is suspected, repeat testing on a new sample collected in 7 to 14 days is recommended.   Basic Metabolic Panel     Status: Abnormal   Collection Time: 08/13/22 11:30 AM  Result Value Ref Range   Glucose 123 (H) 70 - 99 mg/dL   BUN 12 6 - 24 mg/dL   Creatinine, Ser 9.56 0.76 - 1.27 mg/dL   eGFR 387 >56 EP/PIR/5.18   BUN/Creatinine Ratio 15 9 - 20   Sodium 139 134 - 144 mmol/L   Potassium 4.4 3.5 - 5.2 mmol/L   Chloride 100 96 - 106 mmol/L   CO2 24 20 - 29 mmol/L   Calcium 9.2 8.7 - 10.2 mg/dL  CBC with Differential     Status: Abnormal   Collection Time: 08/18/22  9:26 PM  Result Value Ref Range   WBC 7.3 4.0 -  10.5 K/uL   RBC 5.09 4.22 - 5.81 MIL/uL   Hemoglobin 14.9 13.0 - 17.0 g/dL   HCT 64.3 32.9 - 51.8 %   MCV 84.5 80.0 - 100.0 fL   MCH 29.3 26.0 - 34.0 pg   MCHC 34.7 30.0 - 36.0 g/dL   RDW 84.1 66.0 - 63.0 %   Platelets 139 (L) 150 - 400 K/uL   nRBC 0.0 0.0 - 0.2 %   Neutrophils Relative % 46 %   Neutro Abs 3.4 1.7 - 7.7  K/uL   Lymphocytes Relative 43 %   Lymphs Abs 3.2 0.7 - 4.0 K/uL   Monocytes Relative 7 %   Monocytes Absolute 0.5 0.1 - 1.0 K/uL   Eosinophils Relative 3 %   Eosinophils Absolute 0.2 0.0 - 0.5 K/uL   Basophils Relative 1 %   Basophils Absolute 0.0 0.0 - 0.1 K/uL   Immature Granulocytes 0 %   Abs Immature Granulocytes 0.01 0.00 - 0.07 K/uL    Comment: Performed at [facility], [address] [place], [place] [address]  Basic metabolic panel     Status: Abnormal   Collection Time: 08/18/22  9:26 PM  Result Value Ref Range   Sodium 135 135 - 145 mmol/L   Potassium 4.3 3.5 - 5.1 mmol/L   Chloride 99 98 - 111 mmol/L   CO2 28 22 - 32 mmol/L   Glucose, Bld 103 (H) 70 - 99 mg/dL    Comment: Glucose reference range applies only to samples taken after fasting for at least 8 hours.   BUN 15 6 - 20 mg/dL   Creatinine, Ser 9.32 0.61 - 1.24 mg/dL   Calcium 8.7 (L) 8.9 - 10.3 mg/dL   GFR, Estimated >35 >57 mL/min    Comment: (NOTE) Calculated using the CKD-EPI Creatinine Equation (2021)    Anion gap 8 5 - 15    Comment: Performed at [facility], [address] [place], [place] [address]  [id number]     Status: Abnormal   Collection Time: 09/04/22  3:56 PM  Result Value Ref Range   TSH 0.335 (L) 0.450 - 4.500 uIU/mL   T3, Free 3.8 2.0 - 4.4 pg/mL   Free T4 1.04 0.82 - 1.77 ng/dL  VITAMIN D 25 Hydroxy (Vit-D Deficiency, Fractures)     Status: None   Collection Time: 10/04/22  3:13 PM  Result Value Ref Range   Vit D, 25-Hydroxy 47.5 30.0 - 100.0 ng/mL    Comment: Vitamin D deficiency has been defined by the Institute of Medicine and an Endocrine Society practice guideline as a level of serum 25-OH vitamin D less than 20 ng/mL (1,2). The Endocrine Society went on to further define vitamin D insufficiency as a level between 21 and 29 ng/mL (2). 1. IOM (Institute of Medicine). 2010. Dietary reference    intakes for calcium and D. [place]: The    [employer]. 2. Holick MF, Binkley [place], Bischoff-Ferrari HA, et al.    Evaluation, treatment, and prevention of vitamin D    deficiency: an Endocrine Society clinical practice    guideline. JCEM. 2011 Jul; 96(7):1911-30.   TSH+T4F+T3Free     Status: Abnormal   Collection Time: 10/04/22  3:13 PM  Result Value Ref Range   TSH 0.255 (L) 0.450 - 4.500 uIU/mL   T3, Free 3.5 2.0 - 4.4 pg/mL   Free T4 1.24 0.82 - 1.77 ng/dL  Thyroid stimulating immunoglobulin  Status: None   Collection Time: 10/04/22  3:13 PM  Result Value Ref Range   Thyroid Stim Immunoglobulin <0.10 0.00 - 0.55 IU/L  Specimen status report     Status: None   Collection Time: 10/04/22  3:13 PM  Result Value Ref Range   specimen status report Comment     Comment: Written Authorization Written Authorization Written Authorization Received. Authorization received from [employer] 10-09-2022 Logged by Adria Dill      Review of Systems: Patient complains of symptoms per HPI as well as the following symptoms faciial pain. Pertinent negatives and positives per HPI. All others negative.   Social History   Socioeconomic History   Marital status: Single    Spouse name: Not on file   Number of children: 3   Years of education: Not on file   Highest education level: Not on file  Occupational History   Occupation: manual labor  Tobacco Use   Smoking status: Former    Current packs/day: 0.00    Types: Cigars, Cigarettes    Quit date: 05/10/2015    Years since quitting: 7.4   Smokeless tobacco: Never   Tobacco comments:    Occ black and  milds   Vaping Use   Vaping status: Never Used  Substance and Sexual Activity   Alcohol use: Yes    Comment: occ   Drug use: Yes    Types: Marijuana   Sexual activity: Not on file  Other Topics Concern   Not on file  Social History Narrative   Not on file   Social Determinants of Health   Financial Resource Strain: Low Risk  (05/31/2022)   Overall Financial  Resource Strain (CARDIA)    Difficulty of Paying Living Expenses: Not very hard  Food Insecurity: No Food Insecurity (05/31/2022)   Hunger Vital Sign    Worried About Running Out of Food in the Last Year: Never true    Ran Out of Food in the Last Year: Never true  Transportation Needs: No Transportation Needs (05/31/2022)   PRAPARE - Administrator, Civil Service (Medical): No    Lack of Transportation (Non-Medical): No  Physical Activity: Unknown (08/07/2022)   Exercise Vital Sign    Days of Exercise per Week: 5 days    Minutes of Exercise per Session: Patient declined  Recent Concern: Physical Activity - Inactive (05/31/2022)   Exercise Vital Sign    Days of Exercise per Week: 0 days    Minutes of Exercise per Session: 0 min  Stress: No Stress Concern Present (08/07/2022)   [employer] of Occupational Health - Occupational Stress Questionnaire    Feeling of Stress : Only a little  Social Connections: Unknown (08/07/2022)   Social Connection and Isolation Panel [NHANES]    Frequency of Communication with Friends and Family: Twice a week    Frequency of Social Gatherings with Friends and Family: Twice a week    Attends Religious Services: Never    Database administrator or Organizations: No    Attends Engineer, structural: Never    Marital Status: Patient declined  Recent Concern: Social Connections - Moderately Isolated (05/31/2022)   Social Connection and Isolation Panel [NHANES]    Frequency of Communication with Friends and Family: Twice a week    Frequency of Social Gatherings with Friends and Family: Twice a week    Attends Religious Services: Never    Database administrator or Organizations: No    Attends [employer]  or Organization Meetings: Never    Marital Status: Living with partner  Intimate Partner Violence: Not At Risk (05/31/2022)   Humiliation, Afraid, Rape, and Kick questionnaire    Fear of Current or Ex-Partner: No    Emotionally Abused: No     Physically Abused: No    Sexually Abused: No    Family History  Problem Relation Age of Onset   Cancer Mother        breast cancer    Past Medical History:  Diagnosis Date   Hypertension 2003   dx age 5    Wears glasses     Patient Active Problem List   Diagnosis Date Noted   Pulmonary embolism on right (HCC) 09/24/2022   Nasal cavity mass 09/24/2022   Acute deep vein thrombosis (DVT) of femoral vein of right lower extremity (HCC) 08/21/2022   Neck pain 08/14/2021   Prediabetes 01/28/2021   Vitamin D deficiency 01/28/2021   Pre-ulcerative corn or callous 02/01/2019   Disorder of left Achilles tendon 02/01/2019   Inguinal hernia of left side without obstruction or gangrene 12/08/2016   Former tobacco use 12/08/2016   Localized swelling, mass and lump, multiple sites 12/08/2016   Subcutaneous nodules 07/09/2016   Periodontitis 05/27/2015   Pain in left wrist 05/27/2015   HTN (hypertension) 05/12/2015    Past Surgical History:  Procedure Laterality Date   CHOLECYSTECTOMY      Current Outpatient Medications  Medication Sig Dispense Refill   amLODipine (NORVASC) 10 MG tablet Take 1 tablet (10 mg total) by mouth daily. 90 tablet 1   apixaban (ELIQUIS) 5 MG TABS tablet Take 1 tablet (5 mg total) by mouth 2 (two) times daily. Start taking after completion of starter pack. 60 tablet 5   carvedilol (COREG) 25 MG tablet Take 1 tablet (25 mg total) by mouth 2 (two) times daily with a meal. 180 tablet 1   losartan (COZAAR) 100 MG tablet Take 1 tablet (100 mg total) by mouth daily. 90 tablet 1   methimazole (TAPAZOLE) 10 MG tablet Take 1 tablet (10 mg total) by mouth daily. 30 tablet 1   methocarbamol (ROBAXIN) 500 MG tablet Take 1 tablet (500 mg total) by mouth 2 (two) times daily as needed for muscle spasms. 30 tablet 0   spironolactone (ALDACTONE) 50 MG tablet Take 1 tablet (50 mg total) by mouth daily. 90 tablet 1   clindamycin (CLEOCIN) 150 MG capsule Take 1 capsule (150 mg  total) by mouth 3 (three) times daily. 21 capsule 0   sildenafil (VIAGRA) 100 MG tablet Take 0.5-1 tablets (50-100 mg total) by mouth daily as needed for erectile dysfunction. 10 tablet 11   Vitamin D, Ergocalciferol, (DRISDOL) 1.25 MG (50000 UNIT) CAPS capsule Take 1 capsule (50,000 Units total) by mouth every 7 (seven) days. 12 capsule 1   No current facility-administered medications for this visit.    Allergies as of 10/23/2022 - Review Complete 10/23/2022  Allergen Reaction Noted   Doxycycline Other (See Comments) 08/13/2022   Penicillins Swelling and Rash 05/19/2010    Vitals: BP 125/82   Pulse 73   Ht 6\' 2"  (1.88 m)   Wt 208 lb (94.3 kg)   BMI 26.71 kg/m  Last Weight:  Wt Readings from Last 1 Encounters:  10/23/22 208 lb (94.3 kg)   Last Height:   Ht Readings from Last 1 Encounters:  10/23/22 6\' 2"  (1.88 m)     Physical exam: Exam: Gen: NAD, conversant, well nourised, obese, well groomed  CV: RRR, no MRG. No Carotid Bruits. No peripheral edema, warm, nontender Eyes: Conjunctivae clear without exudates or hemorrhage  Neuro: Detailed Neurologic Exam  Speech:    Speech is normal; fluent and spontaneous with normal comprehension.  Cognition:    The patient is oriented to person, place, and time;     recent and remote memory intact;     language fluent;     normal attention, concentration,     fund of knowledge Cranial Nerves:    The pupils are equal, round, and reactive to light. The fundi are flat Visual fields are full to finger confrontation. Extraocular movements are intact. Trigeminal sensationimpaured o right. Right lower facial droop, cannot close eyes tightly with bels phenomenon, drooling right corner of mouth, cannot raise riht eyebrow. The palate elevates in the midline. Hearing intact. Voice is normal. Shoulder shrug is normal. The tongue has normal motion without fasciculations.   Coordination: nml  Gait: nml  Motor  Observation:    No asymmetry, no atrophy, and no involuntary movements noted. Tone:    Normal muscle tone.    Posture:    Posture is normal. normal erect    Strength:    Strength is V/V in the upper and lower limbs.      Sensation: intact to LT     Reflex Exam:  DTR's:    Deep tendon reflexes in the upper and lower extremities are normal bilaterally.   Toes:    The toes are downgoing bilaterally.   Clonus:    Clonus is absent.    Assessment/Plan:  Patient with a nasal mass seen on 2016 with eroision of the nasal septum Also cranial nerve 5 and 7 palsy  [place] came in to say thank you, his surgery is upcoming for his nasal mass, lovely gentleman  09/2022: [facility] REPORT     STUDY DATE: 09/15/2022 PATIENT NAME: [name] DOB: 01-Mar-1976 MRN: [id number]   EXAM: MRI Brain without contrast   ORDERING CLINICIAN: Naomie Dean, MD CLINICAL HISTORY: 47 year old man with right-sided Bell's palsy and a nasal cavity mass COMPARISON FILMS: CT head 06/22/2014 and CT maxillofacial 08/15/2022   TECHNIQUE: MRI of the brain without contrast was obtained utilizing 5 mm axial slices with T1, T2, T2 flair, SWI and diffusion weighted views.  T1 sagittal and T2 coronal views were obtained. CONTRAST: none (the study was ordered with and without the due to claustrophobia the study ended before contrast) IMAGING SITE: [facility], [address] [place], [place]   FINDINGS: On sagittal images, the spinal cord is imaged caudally to C3 and is normal in caliber.   The contents of the posterior fossa are of normal size and position.   The pituitary gland and optic chiasm appear normal.    Brain volume appears normal.   The ventricles are normal in size and without distortion.  There are no abnormal extra-axial collections of fluid.     The cerebellum and brainstem appears normal.   The deep gray matter appears normal.   There are several punctate T2/FLAIR hypertense foci in the subcortical or  deep white matter of the cerebral hemispheres.  None of these appear to be acute.  Diffusion weighted images are normal.  Susceptibility weighted images are normal.     There is a 43 x 27 x 49 mm (AP x transverse x craniocaudal) mass in the right nasopharynx, unchanged compared to the CT scan from 08/15/2022 but increase in size compared to the CT scan from 06/22/2014.  The  mass occludes the right ostiomeatal complex and distal to the right maxillary sinus.  There is an air-fluid level in the right maxillary sinus, not present on the CT scan from 08/15/2022.  One of the right ethmoid air cells is opacified.  There is minimal mucoperiosteal thickening of the frontal recesses though the frontal sinuses appear normal.  The sphenoid sinus and left maxillary sinus appear normal..      The orbits appear normal.   The VIIth/VIIIth nerve complex appears normal (in this noncontrast study).  The mastoid air cells appear normal.  Flow voids are identified within the major intracerebral arteries.   Due to claustrophobia, the study ended before contrasted images could be obtained, limiting interpretation of the facial nerves.     IMPRESSION: This MRI of the brain without contrast shows the following: Few scattered T2/FLAIR hyperintense foci in the cerebral hemispheres are most consistent with very minimal chronic microvascular ischemic change.  Not appear to be acute and these are unlikely to be symptomatic. 43 x 27 x 49 mm mass in the right nasopharynx seen on previous studies.  This is worrisome for neoplasm.  If not already done so recommend ENT further evaluation. In this noncontrasted study, the right facial nerve appeared normal though inflammation of a Bell's palsy cannot be assessed without contrast.  MRI brain: Left 7th and 5th nerve palsy need MRI brain and labs to evaluate for Other conditions -- such as a stroke, infections, Lyme or other systemic diseases also demyelinating disease, space occupying mass.   CT maxillofacial ALso had a septal mass eroding the septal bone not examinated found in 2016 with facial pain. Will also send for CT Maxillofacial. Coul dhave eroded into the skull to the brain. Need to look for abscess, impact on the crnial nerves, invasion into the brain or nasal cavities.  FINDINGS: 2016  Skull and Sinuses:There is a polypoid opacity in the upper right nasal cavity with loss of the upper bony nasal septum. The structure measures 15 mm in height by 9 mm in maximal thickness. No erosion of the anterior cranial fossa floor. No sinusitis.        No orders of the defined types were placed in this encounter.  No orders of the defined types were placed in this encounter.  Discussed caring for eye so cornea does not dry out   Cc: [name] Matar, MD,  [name] Matar, MD  [name] Dean, MD  [facility] [address] , [place] [address]  Phone [phone] Fax [phone]

## 2022-10-24 ENCOUNTER — Other Ambulatory Visit: Payer: Self-pay

## 2022-10-25 ENCOUNTER — Other Ambulatory Visit: Payer: Self-pay

## 2022-10-26 ENCOUNTER — Other Ambulatory Visit: Payer: Self-pay

## 2022-11-03 ENCOUNTER — Encounter: Payer: 59 | Admitting: Nurse Practitioner

## 2022-11-03 ENCOUNTER — Telehealth: Payer: 59 | Admitting: Nurse Practitioner

## 2022-11-03 DIAGNOSIS — M5442 Lumbago with sciatica, left side: Secondary | ICD-10-CM | POA: Diagnosis not present

## 2022-11-03 DIAGNOSIS — G8929 Other chronic pain: Secondary | ICD-10-CM | POA: Diagnosis not present

## 2022-11-03 DIAGNOSIS — M5441 Lumbago with sciatica, right side: Secondary | ICD-10-CM | POA: Diagnosis not present

## 2022-11-03 MED ORDER — PREDNISONE 20 MG PO TABS
40.0000 mg | ORAL_TABLET | Freq: Every day | ORAL | 0 refills | Status: DC
  Filled 2022-11-03: qty 10, 5d supply, fill #0

## 2022-11-03 MED ORDER — METHOCARBAMOL 500 MG PO TABS
500.0000 mg | ORAL_TABLET | Freq: Two times a day (BID) | ORAL | 0 refills | Status: DC | PRN
Start: 2022-11-03 — End: 2022-11-09

## 2022-11-03 MED ORDER — METHOCARBAMOL 500 MG PO TABS
500.0000 mg | ORAL_TABLET | Freq: Two times a day (BID) | ORAL | 0 refills | Status: DC | PRN
  Filled 2022-11-03: qty 30, 15d supply, fill #0

## 2022-11-03 MED ORDER — PREDNISONE 20 MG PO TABS: 40.0000 mg | ORAL_TABLET | Freq: Every day | ORAL | 0 refills | Status: AC

## 2022-11-03 NOTE — Progress Notes (Signed)
Virtual Visit Consent   [name], you are scheduled for a virtual visit with Mary-Margaret Daphine Deutscher, FNP, a [facility] provider, today.     Just as with appointments in the office, your consent must be obtained to participate.  Your consent will be active for this visit and any virtual visit you may have with one of our providers in the next 365 days.     If you have a MyChart account, a copy of this consent can be sent to you electronically.  All virtual visits are billed to your insurance company just like a traditional visit in the office.    As this is a virtual visit, video technology does not allow for your provider to perform a traditional examination.  This may limit your provider's ability to fully assess your condition.  If your provider identifies any concerns that need to be evaluated in person or the need to arrange testing (such as labs, EKG, etc.), we will make arrangements to do so.     Although advances in technology are sophisticated, we cannot ensure that it will always work on either your end or our end.  If the connection with a video visit is poor, the visit may have to be switched to a telephone visit.  With either a video or telephone visit, we are not always able to ensure that we have a secure connection.     I need to obtain your verbal consent now.   Are you willing to proceed with your visit today? YES   [name] has provided verbal consent on 11/03/2022 for a virtual visit (video or telephone).   Mary-Margaret Daphine Deutscher, FNP   Date: 11/03/2022 11:03 AM   Virtual Visit via Video Note   I, Mary-Margaret Daphine Deutscher, connected with [name] ([id number], January 09, 1976) on 11/03/22 at 12:15 PM EDT by a video-enabled telemedicine application and verified that I am speaking with the correct person using two identifiers.  Location: Patient: Virtual Visit Location Patient: Home Provider: Virtual Visit Location Provider: Mobile   I discussed the limitations  of evaluation and management by telemedicine and the availability of in person appointments. The patient expressed understanding and agreed to proceed.    History of Present Illness: [name] is a 47 y.o. who identifies as a male who was assigned male at birth, and is being seen today for back pain.  HPI: Back Pain This is a new problem. The current episode started in the past 7 days. The problem occurs constantly. The problem has been gradually worsening since onset. The pain is present in the lumbar spine. The quality of the pain is described as stabbing. The pain radiates to the right thigh. The pain is at a severity of 9/10. The pain is severe. The symptoms are aggravated by bending (weight bearing). He has tried analgesics and muscle relaxant for the symptoms. The treatment provided mild relief.    Review of Systems  Musculoskeletal:  Positive for back pain.    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   Acute deep vein thrombosis (DVT) of femoral vein of right lower extremity (HCC) 08/21/2022   Neck pain 08/14/2021   Prediabetes 01/28/2021   Vitamin D deficiency 01/28/2021   Pre-ulcerative corn or callous 02/01/2019   Disorder of left Achilles tendon 02/01/2019   Inguinal hernia of left  side without obstruction or gangrene 12/08/2016   Former tobacco use 12/08/2016   Localized swelling, mass and lump, multiple sites 12/08/2016   Subcutaneous nodules 07/09/2016   Periodontitis 05/27/2015   Pain in left wrist 05/27/2015   HTN (hypertension) 05/12/2015    Allergies:   Medications:  Current Outpatient Medications:    amLODipine (NORVASC) 10 MG tablet, Take 1 tablet (10 mg total) by mouth daily., Disp: 90 tablet, Rfl: 1   apixaban (ELIQUIS) 5 MG TABS tablet, Take 1 tablet (5 mg total) by mouth 2 (two) times daily. Start taking after  completion of starter pack., Disp: 60 tablet, Rfl: 5   carvedilol (COREG) 25 MG tablet, Take 1 tablet (25 mg total) by mouth 2 (two) times daily with a meal., Disp: 180 tablet, Rfl: 1   losartan (COZAAR) 100 MG tablet, Take 1 tablet (100 mg total) by mouth daily., Disp: 90 tablet, Rfl: 1   methimazole (TAPAZOLE) 10 MG tablet, Take 1 tablet (10 mg total) by mouth daily., Disp: 30 tablet, Rfl: 1   methocarbamol (ROBAXIN) 500 MG tablet, Take 1 tablet (500 mg total) by mouth 2 (two) times daily as needed for muscle spasms., Disp: 30 tablet, Rfl: 0   sildenafil (VIAGRA) 100 MG tablet, Take 0.5-1 tablets (50-100 mg total) by mouth daily as needed for erectile dysfunction., Disp: 10 tablet, Rfl: 11   spironolactone (ALDACTONE) 50 MG tablet, Take 1 tablet (50 mg total) by mouth daily., Disp: 90 tablet, Rfl: 1  Observations/Objective: Patient is well-developed, well-nourished in no acute distress.  Resting comfortably  at home.  Head is normocephalic, atraumatic.  No labored breathing.  Speech is clear and coherent with logical content.  Patient is alert and oriented at baseline.  Decrease ROM with pain on flexion and rotation Difficulity going from sitting to standing  Assessment and Plan:  [name] in today with chief complaint of Back Pain   1. Chronic bilateral low back pain with right-sided sciatica - methocarbamol (ROBAXIN) 500 MG tablet; Take 1 tablet (500 mg total) by mouth 2 (two) times daily as needed for muscle spasms.  Dispense: 30 tablet; Refill: 0  2. Acute right-sided low back pain with bilateral sciatica Moist heat Rest no lifting  Meds ordered this encounter  Medications   DISCONTD: methocarbamol (ROBAXIN) 500 MG tablet    Sig: Take 1 tablet (500 mg total) by mouth 2 (two) times daily as needed for muscle spasms.    Dispense:  30 tablet    Refill:  0    Order Specific Question:   Supervising Provider    Answer:   Merrilee Jansky [[id number]]   DISCONTD: predniSONE  (DELTASONE) 20 MG tablet    Sig: Take 2 tablets (40 mg total) by mouth daily with breakfast for 5 days. 2 po daily for 5 days    Dispense:  10 tablet    Refill:  0    Order Specific Question:   Supervising Provider    Answer:   Merrilee Jansky [[id number]]   methocarbamol (ROBAXIN) 500 MG tablet    Sig: Take 1 tablet (500 mg total) by mouth 2 (two) times daily as needed for muscle spasms.    Dispense:  30 tablet    Refill:  0    Order Specific Question:   Supervising Provider    Answer:   Merrilee Jansky [[id number]]   predniSONE (DELTASONE) 20 MG tablet    Sig: Take 2 tablets (40 mg total) by mouth daily with breakfast  for 5 days. 2 po daily for 5 days    Dispense:  10 tablet    Refill:  0    Order Specific Question:   Supervising Provider    Answer:   Merrilee Jansky [id number]     Follow Up Instructions: I discussed the assessment and treatment plan with the patient. The patient was provided an opportunity to ask questions and all were answered. The patient agreed with the plan and demonstrated an understanding of the instructions.  A copy of instructions were sent to the patient via MyChart.  The patient was advised to call back or seek an in-person evaluation if the symptoms worsen or if the condition fails to improve as anticipated.  Time:  I spent 7 minutes with the patient via telehealth technology discussing the above problems/concerns.    Mary-Margaret Daphine Deutscher, FNP

## 2022-11-03 NOTE — Patient Instructions (Signed)
[name], thank you for joining Bennie Pierini, FNP for today's virtual visit.  While this provider is not your primary care provider (PCP), if your PCP is located in our provider database this encounter information will be shared with them immediately following your visit.   A [facility] MyChart account gives you access to today's visit and all your visits, tests, and labs performed at [facility] " click here if you don't have a [facility] MyChart account or go to [email or web address]  Consent: (Patient) [name] provided verbal consent for this virtual visit at the beginning of the encounter.  Current Medications:  Current Outpatient Medications:    amLODipine (NORVASC) 10 MG tablet, Take 1 tablet (10 mg total) by mouth daily., Disp: 90 tablet, Rfl: 1   apixaban (ELIQUIS) 5 MG TABS tablet, Take 1 tablet (5 mg total) by mouth 2 (two) times daily. Start taking after completion of starter pack., Disp: 60 tablet, Rfl: 5   carvedilol (COREG) 25 MG tablet, Take 1 tablet (25 mg total) by mouth 2 (two) times daily with a meal., Disp: 180 tablet, Rfl: 1   losartan (COZAAR) 100 MG tablet, Take 1 tablet (100 mg total) by mouth daily., Disp: 90 tablet, Rfl: 1   methimazole (TAPAZOLE) 10 MG tablet, Take 1 tablet (10 mg total) by mouth daily., Disp: 30 tablet, Rfl: 1   methocarbamol (ROBAXIN) 500 MG tablet, Take 1 tablet (500 mg total) by mouth 2 (two) times daily as needed for muscle spasms., Disp: 30 tablet, Rfl: 0   predniSONE (DELTASONE) 20 MG tablet, Take 2 tablets (40 mg total) by mouth daily with breakfast for 5 days. 2 po daily for 5 days, Disp: 10 tablet, Rfl: 0   sildenafil (VIAGRA) 100 MG tablet, Take 0.5-1 tablets (50-100 mg total) by mouth daily as needed for erectile dysfunction., Disp: 10 tablet, Rfl: 11   spironolactone (ALDACTONE) 50 MG tablet, Take 1 tablet (50 mg total) by mouth daily., Disp: 90 tablet, Rfl: 1   Medications ordered in this  encounter:  Meds ordered this encounter  Medications   DISCONTD: methocarbamol (ROBAXIN) 500 MG tablet    Sig: Take 1 tablet (500 mg total) by mouth 2 (two) times daily as needed for muscle spasms.    Dispense:  30 tablet    Refill:  0    Order Specific Question:   Supervising Provider    Answer:   Merrilee Jansky [[id number]]   DISCONTD: predniSONE (DELTASONE) 20 MG tablet    Sig: Take 2 tablets (40 mg total) by mouth daily with breakfast for 5 days. 2 po daily for 5 days    Dispense:  10 tablet    Refill:  0    Order Specific Question:   Supervising Provider    Answer:   Merrilee Jansky [[id number]]   methocarbamol (ROBAXIN) 500 MG tablet    Sig: Take 1 tablet (500 mg total) by mouth 2 (two) times daily as needed for muscle spasms.    Dispense:  30 tablet    Refill:  0    Order Specific Question:   Supervising Provider    Answer:   Merrilee Jansky [[id number]]   predniSONE (DELTASONE) 20 MG tablet    Sig: Take 2 tablets (40 mg total) by mouth daily with breakfast for 5 days. 2 po daily for 5 days    Dispense:  10 tablet    Refill:  0    Order Specific Question:   Supervising Provider  AnswerMerrilee Jansky [[id number]]     *If you need refills on other medications prior to your next appointment, please contact your pharmacy*  Follow-Up: Call back or seek an in-person evaluation if the symptoms worsen or if the condition fails to improve as anticipated.  [facility] [phone]  Other Instructions Moist heat rest   If you have been instructed to have an in-person evaluation today at a local Urgent Care facility, please use the link below. It will take you to a list of all of our available [facility], including address, phone number and hours of operation. Please do not delay care.  [facility]  If you or a family member do not have a primary care provider, use the link below to schedule a visit and establish care. When you  choose a [facility] primary care physician or advanced practice provider, you gain a long-term partner in health. Find a Primary Care Provider  Learn more about [facility]'s in-office and virtual care options: [facility] - Get Care Now

## 2022-11-03 NOTE — Progress Notes (Signed)
Erroneous encounter  Patient just needed letter signed  by me.    Mary-Margaret Daphine Deutscher, FNP

## 2022-11-05 ENCOUNTER — Other Ambulatory Visit: Payer: Self-pay

## 2022-11-09 ENCOUNTER — Telehealth: Payer: Self-pay | Admitting: Internal Medicine

## 2022-11-09 ENCOUNTER — Telehealth: Payer: 59 | Admitting: Urgent Care

## 2022-11-09 ENCOUNTER — Other Ambulatory Visit: Payer: Self-pay

## 2022-11-09 DIAGNOSIS — I82411 Acute embolism and thrombosis of right femoral vein: Secondary | ICD-10-CM

## 2022-11-09 DIAGNOSIS — G8929 Other chronic pain: Secondary | ICD-10-CM | POA: Diagnosis not present

## 2022-11-09 DIAGNOSIS — M545 Low back pain, unspecified: Secondary | ICD-10-CM

## 2022-11-09 DIAGNOSIS — C801 Malignant (primary) neoplasm, unspecified: Secondary | ICD-10-CM

## 2022-11-09 MED ORDER — METHOCARBAMOL 750 MG PO TABS
1500.0000 mg | ORAL_TABLET | Freq: Four times a day (QID) | ORAL | 0 refills | Status: AC
  Filled 2022-11-09: qty 80, 10d supply, fill #0

## 2022-11-09 NOTE — Patient Instructions (Signed)
[name], thank you for joining Maretta Bees, PA for today's virtual visit.  While this provider is not your primary care provider (PCP), if your PCP is located in our provider database this encounter information will be shared with them immediately following your visit.   A [facility] MyChart account gives you access to today's visit and all your visits, tests, and labs performed at [facility] " click here if you don't have a [facility] MyChart account or go to [email or web address]  Consent: (Patient) [name] provided verbal consent for this virtual visit at the beginning of the encounter.  Current Medications:  Current Outpatient Medications:    methocarbamol (ROBAXIN) 750 MG tablet, Take 2 tablets (1,500 mg total) by mouth 4 (four) times daily for 10 days., Disp: 80 tablet, Rfl: 0   amLODipine (NORVASC) 10 MG tablet, Take 1 tablet (10 mg total) by mouth daily., Disp: 90 tablet, Rfl: 1   apixaban (ELIQUIS) 5 MG TABS tablet, Take 1 tablet (5 mg total) by mouth 2 (two) times daily. Start taking after completion of starter pack., Disp: 60 tablet, Rfl: 5   carvedilol (COREG) 25 MG tablet, Take 1 tablet (25 mg total) by mouth 2 (two) times daily with a meal., Disp: 180 tablet, Rfl: 1   losartan (COZAAR) 100 MG tablet, Take 1 tablet (100 mg total) by mouth daily., Disp: 90 tablet, Rfl: 1   methimazole (TAPAZOLE) 10 MG tablet, Take 1 tablet (10 mg total) by mouth daily., Disp: 30 tablet, Rfl: 1   sildenafil (VIAGRA) 100 MG tablet, Take 0.5-1 tablets (50-100 mg total) by mouth daily as needed for erectile dysfunction., Disp: 10 tablet, Rfl: 11   spironolactone (ALDACTONE) 50 MG tablet, Take 1 tablet (50 mg total) by mouth daily., Disp: 90 tablet, Rfl: 1   Medications ordered in this encounter:  Meds ordered this encounter  Medications   methocarbamol (ROBAXIN) 750 MG tablet    Sig: Take 2 tablets (1,500 mg total) by mouth 4 (four) times daily for 10 days.     Dispense:  80 tablet    Refill:  0     *If you need refills on other medications prior to your next appointment, please contact your pharmacy*  Follow-Up: Call back or seek an in-person evaluation if the symptoms worsen or if the condition fails to improve as anticipated.  [facility] [phone]  Other Instructions Please stop your 500mg  robaxin and start the 750mg  called in today. You can take two tablets (1500mg ) every 6 hours, or four times daily.  You can continue with tylenol (acetaminophen) 1000mg  every 8 hours as needed. Use a microwaveable heating pad to your lower back. Fill and start taking the prednisone as prescribed on 11/03/22. This is best taken in a single dose in the morning with breakfast. Out of work today; may consider out for 2 additional days to provide extra rest/ relief.  Please call Dr. Laural Benes today! It is imperative that she perform a follow up and possible additional workup for your continued back pain.  IF YOU DEVELOP ANY OF THE FOLLOWING SYMPTOMS, HEAD TO THE ER IMMEDIATELY: - difficulty holding your pee or poop or any significant change to your bowel/ bladder - tingling or numbness in your groin (the area in which would touch the saddle of a horse) - new onset numbness/ tingling shooting down a leg.   If you have been instructed to have an in-person evaluation today at a local Urgent Care  facility, please use the link below. It will take you to a list of all of our available [facility], including address, phone number and hours of operation. Please do not delay care.  [facility]  If you or a family member do not have a primary care provider, use the link below to schedule a visit and establish care. When you choose a [facility] primary care physician or advanced practice provider, you gain a long-term partner in health. Find a Primary Care Provider  Learn more about [facility]'s in-office and virtual care  options: [facility] - Get Care Now

## 2022-11-09 NOTE — Progress Notes (Signed)
Virtual Visit Consent   [name], you are scheduled for a virtual visit with a [name] provider today. Just as with appointments in the office, your consent must be obtained to participate. Your consent will be active for this visit and any virtual visit you may have with one of our providers in the next 365 days. If you have a MyChart account, a copy of this consent can be sent to you electronically.  As this is a virtual visit, video technology does not allow for your provider to perform a traditional examination. This may limit your provider's ability to fully assess your condition. If your provider identifies any concerns that need to be evaluated in person or the need to arrange testing (such as labs, EKG, etc.), we will make arrangements to do so. Although advances in technology are sophisticated, we cannot ensure that it will always work on either your end or our end. If the connection with a video visit is poor, the visit may have to be switched to a telephone visit. With either a video or telephone visit, we are not always able to ensure that we have a secure connection.  By engaging in this virtual visit, you consent to the provision of healthcare and authorize for your insurance to be billed (if applicable) for the services provided during this visit. Depending on your insurance coverage, you may receive a charge related to this service.  I need to obtain your verbal consent now. Are you willing to proceed with your visit today? [name] has provided verbal consent on 11/09/2022 for a virtual visit (video or telephone). Maretta Bees, PA  Date: 11/09/2022 9:30 AM  Virtual Visit via Video Note   I, [name] L Cavin Longman, connected with  [name]  ([id number], 23-May-1975) on 11/09/22 at  8:30 AM EDT by a video-enabled telemedicine application and verified that I am speaking with the correct person using two identifiers.  Location: Patient: Virtual Visit Location Patient:  Home Provider: Virtual Visit Location Provider: Home Office   I discussed the limitations of evaluation and management by telemedicine and the availability of in person appointments. The patient expressed understanding and agreed to proceed.    History of Present Illness: [name] is a 47 y.o. who is being seen today for back pain.  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 Pt states that he has had back pain off and on for a long time. He works at [employer] and is active on the job, lifting and moving heavy boxes. He had an xray 04/26/22 which showed: "IMPRESSION: 1. Mild L4-L5 degenerative disc disease. 2. Bilateral sacroiliac joint sclerosis, more prominent on the left, favor degenerative change. 3. Enlarged left L5 transverse process and pseudoarticulation with the sacrum, typically incidental."  He completed a video visit on 11/03/22 and was prescribed prednisone 40mg  daily x 5 days. He did not pick up the prescription, and has still been going to work. He does typically take Methocarbamol, which he states when he takes 1,000mg  in a single dose in addition to tylenol, this usually controls his back pain. He is almost out of the muscle relaxer.  He states today that his back pain continues, feels spasms on the R with a "skin tightening" sensation, and "swollen" on the L. He denies radicular symptoms, change in bowel or bladder, or saddle anesthesia. He reports sx  feel similar in nature, but more severe, to his normal back pain flares.  Of note, patient was recently dx with R femoral DVT on 08/18/22 and started on eliquis 10mg . He also was dx with PE. He is following with Dr. Bertis Ruddy in oncology as well as Dr. Jenne Pane in ENT.   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   Pulmonary embolism on right (HCC)  09/24/2022   Nasal cavity mass 09/24/2022   Acute deep vein thrombosis (DVT) of femoral vein of right lower extremity (HCC) 08/21/2022   Neck pain 08/14/2021   Prediabetes 01/28/2021   Vitamin D deficiency 01/28/2021   Pre-ulcerative corn or callous 02/01/2019   Disorder of left Achilles tendon 02/01/2019   Inguinal hernia of left side without obstruction or gangrene 12/08/2016   Former tobacco use 12/08/2016   Localized swelling, mass and lump, multiple sites 12/08/2016   Subcutaneous nodules 07/09/2016   Periodontitis 05/27/2015   Pain in left wrist 05/27/2015   HTN (hypertension) 05/12/2015    Allergies:  Allergies  Allergen Reactions   Doxycycline Other (See Comments)    Possible allergen. Developed Bell's Palsy the day after taking antibiotic.   Penicillins Swelling and Rash    Has patient had a PCN reaction causing immediate rash, facial/tongue/throat swelling, SOB or lightheadedness with hypotension: YesYes Has patient had a PCN reaction causing severe rash involving mucus membranes or skin necrosis: NoNo Has patient had a PCN reaction that required hospitalization YesYes Has patient had a PCN reaction occurring within the last 10 years: NoNo If all of the above answers are "NO", then may proceed with Cephalospori   Medications:  Current Outpatient Medications:    methocarbamol (ROBAXIN) 750 MG tablet, Take 2 tablets (1,500 mg total) by mouth 4 (four) times daily for 10 days., Disp: 80 tablet, Rfl: 0   amLODipine (NORVASC) 10 MG tablet, Take 1 tablet (10 mg total) by mouth daily., Disp: 90 tablet, Rfl: 1   apixaban (ELIQUIS) 5 MG TABS tablet, Take 1 tablet (5 mg total) by mouth 2 (two) times daily. Start taking after completion of starter pack., Disp: 60 tablet, Rfl: 5   carvedilol (COREG) 25 MG tablet, Take 1 tablet (25 mg total) by mouth 2 (two) times daily with a meal., Disp: 180 tablet, Rfl: 1   losartan (COZAAR) 100 MG tablet, Take 1 tablet (100 mg total) by mouth  daily., Disp: 90 tablet, Rfl: 1   methimazole (TAPAZOLE) 10 MG tablet, Take 1 tablet (10 mg total) by mouth daily., Disp: 30 tablet, Rfl: 1   sildenafil (VIAGRA) 100 MG tablet, Take 0.5-1 tablets (50-100 mg total) by mouth daily as needed for erectile dysfunction., Disp: 10 tablet, Rfl: 11   spironolactone (ALDACTONE) 50 MG tablet, Take 1 tablet (50 mg total) by mouth daily., Disp: 90 tablet, Rfl: 1  Observations/Objective: Patient is well-developed, well-nourished in no acute distress.  Resting comfortably laying on couch at home.  Head is normocephalic, atraumatic.  No labored breathing. No cough or SOB. Speech is clear and coherent with logical content.  Patient is alert and oriented at baseline.    Assessment and Plan: 1. Chronic bilateral low back pain, unspecified whether sciatica present  2. Acute deep vein thrombosis (DVT) of femoral vein of right lower extremity (HCC)  3. Spindle cell carcinoma (HCC)  Pt has been dealing with recurrent back pain issues for a while, xray in Feb suggestive of degenerative causes. Pt typically responds well  to robaxin, and at this time is only wanting a refill, not wanting any additional medications called in. He never filled his prednisone from 11/03/22 and thus recommended he do so today. I also recommended he use a microwaveable heating pad on his lower back. He is currently denying red flag s/sx, however we discussed this in great detail in which ER visit would be warranted.  However, I am concerned given his persistent primarily R sided sx in the setting of new onset DVT on R. He is on anticoagulation and is following with oncology. I strongly encouraged pt to call Dr. Laural Benes today. I feel that advanced imaging at this time would be warranted given his co- morbidities. Would like additional workup to ensure spindle cell is limited to nasal cavity only and not the cause of his continued back pain. Pt states understanding and is in agreement with  current POA.   Follow Up Instructions: I discussed the assessment and treatment plan with the patient. The patient was provided an opportunity to ask questions and all were answered. The patient agreed with the plan and demonstrated an understanding of the instructions.  A copy of instructions were sent to the patient via MyChart unless otherwise noted below.    The patient was advised to call back or seek an in-person evaluation if the symptoms worsen or if the condition fails to improve as anticipated.  Time:  I spent 17 minutes with the patient via telehealth technology discussing the above problems/concerns.    Antonious Omahoney L Oriyah Lamphear, PA

## 2022-11-09 NOTE — Telephone Encounter (Signed)
Copied from CRM [id number]. Topic: General - Other >> Nov 09, 2022  9:00 AM Dondra Prader A wrote: Reason for CRM: Pt is calling to see if his PCP will put in an order for imaging before his surgery on 11/21/2022. Pt states that the NP recommended a MRI but he is wanting imaging done to make sure nothing else has spread. Please call pt back to discus.

## 2022-11-13 ENCOUNTER — Inpatient Hospital Stay: Payer: 59 | Attending: Hematology and Oncology

## 2022-11-13 ENCOUNTER — Ambulatory Visit (HOSPITAL_BASED_OUTPATIENT_CLINIC_OR_DEPARTMENT_OTHER)
Admission: RE | Admit: 2022-11-13 | Discharge: 2022-11-13 | Disposition: A | Discharge status: Home / Self Care | Payer: 59 | Source: Ambulatory Visit | Attending: Hematology and Oncology | Admitting: Hematology and Oncology

## 2022-11-13 ENCOUNTER — Encounter: Payer: Self-pay | Admitting: Internal Medicine

## 2022-11-13 DIAGNOSIS — C300 Malignant neoplasm of nasal cavity: Secondary | ICD-10-CM | POA: Diagnosis not present

## 2022-11-13 DIAGNOSIS — Z7901 Long term (current) use of anticoagulants: Secondary | ICD-10-CM | POA: Insufficient documentation

## 2022-11-13 DIAGNOSIS — I82411 Acute embolism and thrombosis of right femoral vein: Secondary | ICD-10-CM

## 2022-11-13 DIAGNOSIS — I2699 Other pulmonary embolism without acute cor pulmonale: Secondary | ICD-10-CM | POA: Insufficient documentation

## 2022-11-13 DIAGNOSIS — I2694 Multiple subsegmental pulmonary emboli without acute cor pulmonale: Secondary | ICD-10-CM | POA: Diagnosis present

## 2022-11-13 LAB — CBC WITH DIFFERENTIAL (CANCER CENTER ONLY)
Abs Immature Granulocytes: 0.02 10*3/uL (ref 0.00–0.07)
Basophils Absolute: 0.1 10*3/uL (ref 0.0–0.1)
Basophils Relative: 1 %
Eosinophils Absolute: 0.1 10*3/uL (ref 0.0–0.5)
Eosinophils Relative: 1 %
HCT: 43.8 % (ref 39.0–52.0)
Hemoglobin: 15.1 g/dL (ref 13.0–17.0)
Immature Granulocytes: 0 %
Lymphocytes Relative: 39 %
Lymphs Abs: 2.7 10*3/uL (ref 0.7–4.0)
MCH: 29 pg (ref 26.0–34.0)
MCHC: 34.5 g/dL (ref 30.0–36.0)
MCV: 84.2 fL (ref 80.0–100.0)
Monocytes Absolute: 0.4 10*3/uL (ref 0.1–1.0)
Monocytes Relative: 6 %
Neutro Abs: 3.7 10*3/uL (ref 1.7–7.7)
Neutrophils Relative %: 53 %
Platelet Count: 300 10*3/uL (ref 150–400)
RBC: 5.2 MIL/uL (ref 4.22–5.81)
RDW: 13.1 % (ref 11.5–15.5)
WBC Count: 7 10*3/uL (ref 4.0–10.5)
nRBC: 0 % (ref 0.0–0.2)

## 2022-11-13 LAB — CMP (CANCER CENTER ONLY)
ALT: 11 U/L (ref 0–44)
AST: 8 U/L — ABNORMAL LOW (ref 15–41)
Albumin: 3.9 g/dL (ref 3.5–5.0)
Alkaline Phosphatase: 89 U/L (ref 38–126)
Anion gap: 5 (ref 5–15)
BUN: 9 mg/dL (ref 6–20)
CO2: 30 mmol/L (ref 22–32)
Calcium: 9.2 mg/dL (ref 8.9–10.3)
Chloride: 102 mmol/L (ref 98–111)
Creatinine: 0.93 mg/dL (ref 0.61–1.24)
GFR, Estimated: >60 mL/min (ref >60)
Glucose, Bld: 114 mg/dL — ABNORMAL HIGH (ref 70–99)
Potassium: 4 mmol/L (ref 3.5–5.1)
Sodium: 137 mmol/L (ref 135–145)
Total Bilirubin: 0.3 mg/dL (ref 0.3–1.2)
Total Protein: 7.8 g/dL (ref 6.5–8.1)

## 2022-11-13 LAB — D-DIMER, QUANTITATIVE: D-Dimer, Quant: <0.27 ug{FEU}/mL (ref 0.00–0.50)

## 2022-11-14 ENCOUNTER — Telehealth: Payer: Self-pay | Admitting: Internal Medicine

## 2022-11-14 ENCOUNTER — Telehealth: Payer: 59 | Admitting: Family Medicine

## 2022-11-14 DIAGNOSIS — R0981 Nasal congestion: Secondary | ICD-10-CM

## 2022-11-14 DIAGNOSIS — C119 Malignant neoplasm of nasopharynx, unspecified: Secondary | ICD-10-CM

## 2022-11-14 DIAGNOSIS — R519 Headache, unspecified: Secondary | ICD-10-CM

## 2022-11-14 NOTE — Telephone Encounter (Signed)
Called & spoke to the patient. Verified name & DOB. Scheduled appointment for 11/15/2022. Patient confirmed appointment.

## 2022-11-14 NOTE — Telephone Encounter (Signed)
Letter written for pt

## 2022-11-14 NOTE — Progress Notes (Signed)
Virtual Visit Consent   [name], you are scheduled for a virtual visit with a [facility] provider today. Just as with appointments in the office, your consent must be obtained to participate. Your consent will be active for this visit and any virtual visit you may have with one of our providers in the next 365 days. If you have a MyChart account, a copy of this consent can be sent to you electronically.  As this is a virtual visit, video technology does not allow for your provider to perform a traditional examination. This may limit your provider's ability to fully assess your condition. If your provider identifies any concerns that need to be evaluated in person or the need to arrange testing (such as labs, EKG, etc.), we will make arrangements to do so. Although advances in technology are sophisticated, we cannot ensure that it will always work on either your end or our end. If the connection with a video visit is poor, the visit may have to be switched to a telephone visit. With either a video or telephone visit, we are not always able to ensure that we have a secure connection.  By engaging in this virtual visit, you consent to the provision of healthcare and authorize for your insurance to be billed (if applicable) for the services provided during this visit. Depending on your insurance coverage, you may receive a charge related to this service.  I need to obtain your verbal consent now. Are you willing to proceed with your visit today? [name] has provided verbal consent on 11/14/2022 for a virtual visit (video or telephone). Freddy Finner, NP  Date: 11/14/2022 2:05 PM  Virtual Visit via Video Note   I, Freddy Finner, connected with  [name]  ([id number], [birth year]) on 11/14/22 at  2:15 PM EDT by a video-enabled telemedicine application and verified that I am speaking with the correct person using two identifiers.  Location: Patient: Virtual Visit Location Patient:  Home Provider: Virtual Visit Location Provider: Home Office   I discussed the limitations of evaluation and management by telemedicine and the availability of in person appointments. The patient expressed understanding and agreed to proceed.    History of Present Illness: [name] is a 47 y.o. who identifies as a male who was assigned male at birth, and is being seen today for sinus mass that is causing a lot of stuffiness and congestion.  Onset was mass was found years ago- very small, and neurology found that is was causing an issue and sinus pressure and headache.   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   Acute deep vein thrombosis (DVT) of femoral vein of right lower extremity (HCC) 08/21/2022   Neck pain 08/14/2021   Prediabetes 01/28/2021   Vitamin D deficiency 01/28/2021   Pre-ulcerative corn or callous 02/01/2019   Disorder of left Achilles tendon 02/01/2019   Inguinal hernia of left side without obstruction or gangrene 12/08/2016   Former tobacco use 12/08/2016   Localized swelling, mass and lump, multiple sites 12/08/2016   Subcutaneous nodules 07/09/2016   Periodontitis 05/27/2015   Pain in left wrist  05/27/2015   HTN (hypertension) 05/12/2015    Allergies:   Current Outpatient Medications:    amLODipine (NORVASC) 10 MG tablet, Take 1 tablet (10 mg total) by mouth daily., Disp: 90 tablet, Rfl: 1   apixaban (ELIQUIS) 5 MG TABS tablet, Take 1 tablet (5 mg total) by mouth 2 (two) times daily. Start taking after completion of starter pack., Disp: 60 tablet, Rfl: 5   carvedilol (COREG) 25 MG tablet, Take  1 tablet (25 mg total) by mouth 2 (two) times daily with a meal., Disp: 180 tablet, Rfl: 1   losartan (COZAAR) 100 MG tablet, Take 1 tablet (100 mg total) by mouth daily., Disp: 90 tablet, Rfl: 1   methimazole (TAPAZOLE) 10 MG tablet, Take 1 tablet (10 mg total) by mouth daily., Disp: 30 tablet, Rfl: 1   methocarbamol (ROBAXIN) 750 MG tablet, Take 2 tablets (1,500 mg total) by mouth 4 (four) times daily for 10 days., Disp: 80 tablet, Rfl: 0   sildenafil (VIAGRA) 100 MG tablet, Take 0.5-1 tablets (50-100 mg total) by mouth daily as needed for erectile dysfunction., Disp: 10 tablet, Rfl: 11   spironolactone (ALDACTONE) 50 MG tablet, Take 1 tablet (50 mg total) by mouth daily., Disp: 90 tablet, Rfl: 1  Observations/Objective: Patient is well-developed, well-nourished in no acute distress.  Resting comfortably  at home.  Head is normocephalic, atraumatic.  No labored breathing.  Speech is clear and coherent with logical content.  Patient is alert and oriented at baseline.  Congestion tone is noted   Assessment and Plan: 1. Nasopharyngeal carcinoma (HCC)   2. Congestion of nasal sinus   3. Nonintractable headache, unspecified chronicity pattern, unspecified headache type   -work note for tonight- has he is not breathing very easily at this time -has upcoming surgery to remove mass that should improve these symptoms.   Patient acknowledged agreement and understanding of the plan.   Past Medical, Surgical, Social History, Allergies, and Medications have been Reviewed.      Follow Up Instructions: I discussed the assessment and treatment plan with the patient. The patient was provided an opportunity to ask questions and all were answered. The patient agreed with the plan and demonstrated an understanding of the instructions.  A copy of instructions were sent to the patient via MyChart unless otherwise noted below.    The patient was advised to call back or seek an in-person  evaluation if the symptoms worsen or if the condition fails to improve as anticipated.  Time:  I spent 10 minutes with the patient via telehealth technology discussing the above problems/concerns.    Freddy Finner, NP

## 2022-11-14 NOTE — Telephone Encounter (Signed)
Needs in person appt.

## 2022-11-14 NOTE — Patient Instructions (Signed)
  [name], thank you for joining Freddy Finner, NP for today's virtual visit.  While this provider is not your primary care provider (PCP), if your PCP is located in our provider database this encounter information will be shared with them immediately following your visit.   A [facility] MyChart account gives you access to today's visit and all your visits, tests, and labs performed at [facility] " click here if you don't have a [facility] MyChart account or go to [email or web address]  Consent: (Patient) [name] provided verbal consent for this virtual visit at the beginning of the encounter.  Current Medications:  Current Outpatient Medications:    amLODipine (NORVASC) 10 MG tablet, Take 1 tablet (10 mg total) by mouth daily., Disp: 90 tablet, Rfl: 1   apixaban (ELIQUIS) 5 MG TABS tablet, Take 1 tablet (5 mg total) by mouth 2 (two) times daily. Start taking after completion of starter pack., Disp: 60 tablet, Rfl: 5   carvedilol (COREG) 25 MG tablet, Take 1 tablet (25 mg total) by mouth 2 (two) times daily with a meal., Disp: 180 tablet, Rfl: 1   losartan (COZAAR) 100 MG tablet, Take 1 tablet (100 mg total) by mouth daily., Disp: 90 tablet, Rfl: 1   methimazole (TAPAZOLE) 10 MG tablet, Take 1 tablet (10 mg total) by mouth daily., Disp: 30 tablet, Rfl: 1   methocarbamol (ROBAXIN) 750 MG tablet, Take 2 tablets (1,500 mg total) by mouth 4 (four) times daily for 10 days., Disp: 80 tablet, Rfl: 0   sildenafil (VIAGRA) 100 MG tablet, Take 0.5-1 tablets (50-100 mg total) by mouth daily as needed for erectile dysfunction., Disp: 10 tablet, Rfl: 11   spironolactone (ALDACTONE) 50 MG tablet, Take 1 tablet (50 mg total) by mouth daily., Disp: 90 tablet, Rfl: 1   Medications ordered in this encounter:  No orders of the defined types were placed in this encounter.    *If you need refills on other medications prior to your next appointment, please contact your  pharmacy*  Follow-Up: Call back or seek an in-person evaluation if the symptoms worsen or if the condition fails to improve as anticipated.  [facility] [phone]  Other Instructions   If you have been instructed to have an in-person evaluation today at a local Urgent Care facility, please use the link below. It will take you to a list of all of our available [facility], including address, phone number and hours of operation. Please do not delay care.  [facility]  If you or a family member do not have a primary care provider, use the link below to schedule a visit and establish care. When you choose a [facility] primary care physician or advanced practice provider, you gain a long-term partner in health. Find a Primary Care Provider  Learn more about [facility] in-office and virtual care options: [facility] - Get Care Now

## 2022-11-15 ENCOUNTER — Ambulatory Visit: Payer: 59 | Admitting: Internal Medicine

## 2022-11-16 ENCOUNTER — Inpatient Hospital Stay (HOSPITAL_BASED_OUTPATIENT_CLINIC_OR_DEPARTMENT_OTHER): Payer: 59 | Admitting: Hematology and Oncology

## 2022-11-16 ENCOUNTER — Telehealth: Payer: Self-pay

## 2022-11-16 ENCOUNTER — Encounter: Payer: Self-pay | Admitting: Hematology and Oncology

## 2022-11-16 VITALS — BP 141/90 | HR 66 | Temp 97.8°F | Resp 18 | Ht 74.0 in | Wt 204.4 lb

## 2022-11-16 DIAGNOSIS — I82411 Acute embolism and thrombosis of right femoral vein: Secondary | ICD-10-CM

## 2022-11-16 DIAGNOSIS — J3489 Other specified disorders of nose and nasal sinuses: Secondary | ICD-10-CM

## 2022-11-16 DIAGNOSIS — I2699 Other pulmonary embolism without acute cor pulmonale: Secondary | ICD-10-CM | POA: Diagnosis not present

## 2022-11-16 DIAGNOSIS — C300 Malignant neoplasm of nasal cavity: Secondary | ICD-10-CM | POA: Diagnosis not present

## 2022-11-16 DIAGNOSIS — Z7901 Long term (current) use of anticoagulants: Secondary | ICD-10-CM | POA: Diagnosis not present

## 2022-11-16 NOTE — Assessment & Plan Note (Signed)
I reviewed his recent ultrasound report The presence of age-indeterminate changes on his vein is not indicative of failure of treatment.  He is reassured

## 2022-11-16 NOTE — Assessment & Plan Note (Signed)
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  If it is malignant process, he may need adjuvant treatment.  I will call the patient and schedule future follow-up

## 2022-11-16 NOTE — Assessment & Plan Note (Signed)
[name] is likely provoked by malignant process He is doing well on anticoagulation therapy He will continue treatment for minimum of 6 months or longer He has completed 3 months of uninterrupted anticoagulation therapy and thus would not need bridging therapy  In anticipation for his surgery dated September 11, the patient is given verbal and written instruction not to take apixaban on September 9, September 10, September 11 and 12 and to resume anticoagulation therapy on the 13th, assuming he has no bleeding complications. He expressed understanding

## 2022-11-16 NOTE — Telephone Encounter (Signed)
Called regarding being late for appt today. He forgot and is heading to the office now.

## 2022-11-16 NOTE — Progress Notes (Signed)
[name] OFFICE PROGRESS NOTE  Patient Care Team: [name], MD as PCP - General (Internal Medicine) Maisie Fus, MD as PCP - Cardiology (Cardiology)  ASSESSMENT & PLAN:  Pulmonary embolism on right [facility]) [name] is likely provoked by malignant process He is doing well on anticoagulation therapy He will continue treatment for minimum of 6 months or longer He has completed 3 months of uninterrupted anticoagulation therapy and thus would not need bridging therapy  In anticipation for his surgery dated September 11, the patient is given verbal and written instruction not to take apixaban on September 9, September 10, September 11 and 12 and to resume anticoagulation therapy on the 13th, assuming he has no bleeding complications. He expressed understanding  Acute deep vein thrombosis (DVT) of femoral vein of right lower extremity (HCC) I reviewed his recent ultrasound report The presence of age-indeterminate changes on his vein is not indicative of failure of treatment.  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  If it is malignant process, he may need adjuvant treatment.  I will call the patient and schedule future follow-up  No orders of the defined types were placed in this encounter.   All questions were answered. The patient knows to call the clinic with any problems, questions or concerns. The total time spent in the appointment was 30 minutes encounter with patients including review of chart and various tests results, discussions about plan of care and coordination of care plan   Artis Delay, MD 11/16/2022 2:22 PM  INTERVAL HISTORY: Please see below for problem oriented charting. he returns for treatment follow-up He is doing well on  anticoagulation therapy without bleeding complications We reviewed recent blood work and imaging findings We also discussed future follow-up in regards to his nasal mass We discussed perioperative anticoagulation management  REVIEW OF SYSTEMS:   Constitutional: Denies fevers, chills or abnormal weight loss Eyes: Denies blurriness of vision Ears, nose, mouth, throat, and face: Denies mucositis or sore throat Respiratory: Denies cough, dyspnea or wheezes Cardiovascular: Denies palpitation, chest discomfort or lower extremity swelling Gastrointestinal:  Denies nausea, heartburn or change in bowel habits Skin: Denies abnormal skin rashes Lymphatics: Denies new lymphadenopathy or easy bruising Neurological:Denies numbness, tingling or new weaknesses Behavioral/Psych: Mood is stable, no new changes  All other systems were reviewed with the patient and are negative.  I have reviewed the past medical history, past surgical history, social history and family history with the patient and they are unchanged from previous note.  ALLERGIES:  is allergic to doxycycline and penicillins.  MEDICATIONS:  Current Outpatient Medications  Medication Sig Dispense Refill   amLODipine (NORVASC) 10 MG tablet Take 1 tablet (10 mg total) by mouth daily. 90 tablet 1   apixaban (ELIQUIS) 5 MG TABS tablet Take 1 tablet (5 mg total) by mouth 2 (two) times daily. Start taking after completion of starter pack. 60 tablet 5   carvedilol (COREG) 25 MG tablet Take 1 tablet (25 mg total) by mouth 2 (two) times daily with a meal. 180 tablet 1   losartan (COZAAR) 100 MG tablet Take 1 tablet (100 mg total) by mouth daily. 90 tablet 1   methimazole (TAPAZOLE) 10 MG tablet Take 1 tablet (10 mg total) by mouth daily. 30 tablet 1  methocarbamol (ROBAXIN) 750 MG tablet Take 2 tablets (1,500 mg total) by mouth 4 (four) times daily for 10 days. 80 tablet 0   sildenafil (VIAGRA) 100 MG tablet Take 0.5-1 tablets (50-100 mg total) by  mouth daily as needed for erectile dysfunction. 10 tablet 11   spironolactone (ALDACTONE) 50 MG tablet Take 1 tablet (50 mg total) by mouth daily. 90 tablet 1   No current facility-administered medications for this visit.    SUMMARY OF ONCOLOGIC HISTORY:  [name] 47 y.o. male is here because of recent diagnosis of DVT and PE Patient presented to the emergency department on Jul 29, 2022 with right-sided Bell's palsy. 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  He denies recurrent nosebleeds or nasal discharge  In the meantime, he developed right lower extremity pain and swelling On August 18, 2022, venous Doppler ultrasound revealed findings consistent with occlusive thrombus within the distal RIGHT femoral vein, RIGHT popliteal vein, RIGHT posterior tibial vein and RIGHT peroneal vein. He was anticoagulated.  He also complained of chest pain. On 08/27/2022, he underwent CT angiogram which revealed large pulmonary embolism in the right PA. Extends into segmental and subsegmental branches.    He is doing well on Eliquis and denies bleeding complications.  Gradually, his chest pain went away and leg swelling improves He denies recent history of trauma, long distance travel, dehydration, recent surgery, or prolonged immobilization.  He smoked cigars several times a week up to the diagnosis of blood clot but quit since then  He had prior surgeries before and never had perioperative thromboembolic events. The patient is not on testosterone replacement therapy  There is no family history of blood clots or  miscarriages. Repeat venous Doppler ultrasound showed persistent changes in the lower extremity.  The patient tolerated anticoagulation therapy well   PHYSICAL EXAMINATION: ECOG PERFORMANCE STATUS: 0 - Asymptomatic  Vitals:   11/16/22 1217  BP: (!) 141/90  Pulse: 66  Resp: 18  Temp: 97.8 F (36.6 C)  SpO2: 100%   Filed Weights   11/16/22 1217  Weight: 204 lb 6.4 oz (92.7 kg)    GENERAL:alert, no distress and comfortable   LABORATORY DATA:  I have reviewed the data as listed    Component Value Date/Time   NA 137 11/13/2022 1149   NA 139 08/13/2022 1130   K 4.0 11/13/2022 1149   CL 102 11/13/2022 1149   CO2 30 11/13/2022 1149   GLUCOSE 114 (H) 11/13/2022 1149   BUN 9 11/13/2022 1149   BUN 12 08/13/2022 1130   CREATININE 0.93 11/13/2022 1149   CREATININE 0.97 12/12/2015 1306   CALCIUM 9.2 11/13/2022 1149   PROT 7.8 11/13/2022 1149   PROT 8.0 01/29/2019 1044   ALBUMIN 3.9 11/13/2022 1149   ALBUMIN 4.3 01/29/2019 1044   AST 8 (L) 11/13/2022 1149   ALT 11 11/13/2022 1149   ALKPHOS 89 11/13/2022 1149   BILITOT 0.3 11/13/2022 1149   GFRNONAA >60 11/13/2022 1149   GFRNONAA >89 12/12/2015 1306   GFRAA 91 01/29/2019 1044   GFRAA >89 12/12/2015 1306    No results found for: "  SPEP", "UPEP"  Lab Results  Component Value Date   WBC 7.0 11/13/2022   NEUTROABS 3.7 11/13/2022   HGB 15.1 11/13/2022   HCT 43.8 11/13/2022   MCV 84.2 11/13/2022   PLT 300 11/13/2022      Chemistry      Component Value Date/Time   NA 137 11/13/2022 1149   NA 139 08/13/2022 1130   K 4.0 11/13/2022 1149   CL 102 11/13/2022 1149   CO2 30 11/13/2022 1149   BUN 9 11/13/2022 1149   BUN 12 08/13/2022 1130   CREATININE 0.93 11/13/2022 1149   CREATININE 0.97 12/12/2015 1306      Component Value Date/Time   CALCIUM 9.2 11/13/2022 1149   ALKPHOS 89 11/13/2022 1149   AST 8 (L) 11/13/2022 1149   ALT 11 11/13/2022 1149   BILITOT 0.3 11/13/2022 1149       RADIOGRAPHIC STUDIES: I have  personally reviewed the radiological images as listed and agreed with the findings in the report. VAS [place] LOWER EXTREMITY VENOUS (DVT)  Result Date: 11/13/2022  Lower Venous DVT Study Patient Name:  [name]  Date of Exam:   11/13/2022 Medical Rec #: [id number]        Accession #:    [id number] Date of Birth: [birth year]        Patient Gender: M Patient Age:   47 years Exam Location:  [facility] Procedure:      VAS [place] LOWER EXTREMITY VENOUS (DVT) Referring Phys: Abir Craine --------------------------------------------------------------------------------  Indications: Follow up exam.  Risk Factors: Cancer DVT/PE 08/2022. Anticoagulation: Eliquis. Comparison Study: Previous exam on 09/07/2022 was positive for DVT RLE (distal FV                   & PopV) Performing Technologist: Ernestene Mention RVT, RDMS  Examination Guidelines: A complete evaluation includes B-mode imaging, spectral Doppler, color Doppler, and power Doppler as needed of all accessible portions of each vessel. Bilateral testing is considered an integral part of a complete examination. Limited examinations for reoccurring indications may be performed as noted. The reflux portion of the exam is performed with the patient in reverse Trendelenburg.  +---------+---------------+---------+-----------+----------+-----------------+ RIGHT    CompressibilityPhasicitySpontaneityPropertiesThrombus Aging    +---------+---------------+---------+-----------+----------+-----------------+ CFV      Full           Yes      Yes                                    +---------+---------------+---------+-----------+----------+-----------------+ SFJ      Full                                                           +---------+---------------+---------+-----------+----------+-----------------+ FV Prox  Full           Yes      Yes                                    +---------+---------------+---------+-----------+----------+-----------------+ FV  Mid   Full           Yes      Yes                                    +---------+---------------+---------+-----------+----------+-----------------+  FV DistalFull           Yes      Yes                                    +---------+---------------+---------+-----------+----------+-----------------+ PFV      Full                                                           +---------+---------------+---------+-----------+----------+-----------------+ POP      Partial        Yes      Yes                  Age Indeterminate +---------+---------------+---------+-----------+----------+-----------------+ PTV      Full                                                           +---------+---------------+---------+-----------+----------+-----------------+ PERO     Full                                                           +---------+---------------+---------+-----------+----------+-----------------+   +---------+---------------+---------+-----------+-------------+--------------+ LEFT     CompressibilityPhasicitySpontaneityProperties   Thrombus Aging +---------+---------------+---------+-----------+-------------+--------------+ CFV      Full           Yes      Yes                                    +---------+---------------+---------+-----------+-------------+--------------+ SFJ      Full                                                           +---------+---------------+---------+-----------+-------------+--------------+ FV Prox  Full           Yes      Yes                                    +---------+---------------+---------+-----------+-------------+--------------+ FV Mid   Full           Yes      Yes                                    +---------+---------------+---------+-----------+-------------+--------------+ FV DistalFull           Yes      Yes                                     +---------+---------------+---------+-----------+-------------+--------------+  PFV      Full                                                           +---------+---------------+---------+-----------+-------------+--------------+ POP      Full           Yes      Yes        rouleaux flow               +---------+---------------+---------+-----------+-------------+--------------+ PTV      Full                                                           +---------+---------------+---------+-----------+-------------+--------------+ PERO     Full                                                           +---------+---------------+---------+-----------+-------------+--------------+     Summary: BILATERAL: -No evidence of popliteal cyst, bilaterally. RIGHT: - Findings consistent with age indeterminate deep vein thrombosis involving the right popliteal vein.  Findings appear improved from previous examination.  LEFT: - There is no evidence of deep vein thrombosis in the lower extremity.  *See table(s) above for measurements and observations. Electronically signed by Waverly Ferrari MD on 11/13/2022 at 7:21:47 PM.    Final

## 2022-11-19 ENCOUNTER — Other Ambulatory Visit: Payer: Self-pay | Admitting: Internal Medicine

## 2022-11-19 MED ORDER — FLUCONAZOLE 100 MG PO TABS
ORAL_TABLET | ORAL | 0 refills | Status: AC
  Filled 2022-11-19: qty 5, 4d supply, fill #0

## 2022-11-20 ENCOUNTER — Other Ambulatory Visit: Payer: Self-pay

## 2022-11-21 ENCOUNTER — Other Ambulatory Visit (HOSPITAL_COMMUNITY): Payer: Self-pay

## 2022-11-21 HISTORY — PX: OTHER SURGICAL HISTORY: SHX169

## 2022-11-21 MED ORDER — AMOXICILLIN-POT CLAVULANATE 875-125 MG PO TABS
1.0000 | ORAL_TABLET | Freq: Two times a day (BID) | ORAL | 0 refills | Status: AC
Start: 2022-11-21 — End: ?
  Filled 2022-11-21: qty 14, 7d supply, fill #0

## 2022-11-21 MED ORDER — HYDROCODONE-ACETAMINOPHEN 5-325 MG PO TABS
1.0000 | ORAL_TABLET | Freq: Four times a day (QID) | ORAL | 0 refills | Status: AC | PRN
Start: 2022-11-21 — End: ?
  Filled 2022-11-21: qty 8, 2d supply, fill #0

## 2022-11-21 MED ORDER — ELIQUIS 5 MG PO TABS
5.0000 mg | ORAL_TABLET | Freq: Two times a day (BID) | ORAL | 0 refills | Status: DC
  Filled 2022-11-21: qty 60, 30d supply, fill #0

## 2022-11-23 ENCOUNTER — Other Ambulatory Visit: Payer: Self-pay | Admitting: Internal Medicine

## 2022-11-23 ENCOUNTER — Other Ambulatory Visit (HOSPITAL_COMMUNITY): Payer: Self-pay

## 2022-11-23 ENCOUNTER — Other Ambulatory Visit: Payer: Self-pay

## 2022-11-23 ENCOUNTER — Ambulatory Visit: Payer: Self-pay

## 2022-11-23 MED ORDER — NYSTATIN 100000 UNIT/ML MT SUSP
5.0000 mL | Freq: Four times a day (QID) | OROMUCOSAL | 0 refills | Status: DC
  Filled 2022-11-23: qty 120, 6d supply, fill #0

## 2022-11-23 NOTE — Progress Notes (Signed)
Message from [facility] pharmacist April Procita.  Pt wants Nystatin suspension rather than oral Fluconazole for oral thrush.  Rxn sent for Nystatin.

## 2022-11-23 NOTE — Telephone Encounter (Signed)
Patient called and he says his mom is at the pharmacy right now picking this medication up. He says he messaged Dr. Laural Benes and she sent it in.    Summary: rx req   The patient shares that they are continuing to experience symptoms of oral discomfort that they have previously discussed with their PCP  The patient would like to be prescribed an oral rinse to help with their thrush concerns  Please contact further when possible

## 2022-11-27 ENCOUNTER — Encounter: Payer: Self-pay | Admitting: Neurology

## 2022-12-04 ENCOUNTER — Other Ambulatory Visit: Payer: Self-pay

## 2022-12-06 ENCOUNTER — Other Ambulatory Visit (HOSPITAL_COMMUNITY): Payer: Self-pay

## 2022-12-06 DIAGNOSIS — Z9889 Other specified postprocedural states: Secondary | ICD-10-CM | POA: Diagnosis not present

## 2022-12-06 DIAGNOSIS — C300 Malignant neoplasm of nasal cavity: Secondary | ICD-10-CM | POA: Diagnosis not present

## 2022-12-06 DIAGNOSIS — I82453 Acute embolism and thrombosis of peroneal vein, bilateral: Secondary | ICD-10-CM | POA: Diagnosis not present

## 2022-12-06 DIAGNOSIS — C801 Malignant (primary) neoplasm, unspecified: Secondary | ICD-10-CM | POA: Diagnosis not present

## 2022-12-06 MED ORDER — CELECOXIB 200 MG PO CAPS
200.0000 mg | ORAL_CAPSULE | Freq: Two times a day (BID) | ORAL | 0 refills | Status: AC
Start: 2022-12-06 — End: 2022-12-20
  Filled 2022-12-06: qty 28, 14d supply, fill #0

## 2022-12-07 ENCOUNTER — Telehealth: Payer: 59 | Admitting: Nurse Practitioner

## 2022-12-07 ENCOUNTER — Other Ambulatory Visit: Payer: Self-pay

## 2022-12-07 DIAGNOSIS — G8918 Other acute postprocedural pain: Secondary | ICD-10-CM | POA: Diagnosis not present

## 2022-12-07 NOTE — Progress Notes (Signed)
Virtual Visit Consent   [name], you are scheduled for a virtual visit with a [facility] provider today. Just as with appointments in the office, your consent must be obtained to participate. Your consent will be active for this visit and any virtual visit you may have with one of our providers in the next 365 days. If you have a MyChart account, a copy of this consent can be sent to you electronically.  As this is a virtual visit, video technology does not allow for your provider to perform a traditional examination. This may limit your provider's ability to fully assess your condition. If your provider identifies any concerns that need to be evaluated in person or the need to arrange testing (such as labs, EKG, etc.), we will make arrangements to do so. Although advances in technology are sophisticated, we cannot ensure that it will always work on either your end or our end. If the connection with a video visit is poor, the visit may have to be switched to a telephone visit. With either a video or telephone visit, we are not always able to ensure that we have a secure connection.  By engaging in this virtual visit, you consent to the provision of healthcare and authorize for your insurance to be billed (if applicable) for the services provided during this visit. Depending on your insurance coverage, you may receive a charge related to this service.  I need to obtain your verbal consent now. Are you willing to proceed with your visit today? [name] has provided verbal consent on 12/07/2022 for a virtual visit (video or telephone). [name] Simas, FNP  Date: 12/07/2022 9:00 AM  Virtual Visit via Video Note   I, [name], connected with  [name]  ([id number], 08-02-1975) on 12/07/22 at  9:00 AM EDT by a video-enabled telemedicine application and verified that I am speaking with the correct person using two identifiers.  Location: Patient: Virtual Visit Location Patient:  Home Provider: Virtual Visit Location Provider: Home Office   I discussed the limitations of evaluation and management by telemedicine and the availability of in person appointments. The patient expressed understanding and agreed to proceed.    History of Present Illness: [name] is a 47 y.o. 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   Acute deep vein thrombosis (DVT) of femoral vein of right lower extremity (HCC) 08/21/2022   Neck pain 08/14/2021   Prediabetes 01/28/2021   Vitamin D deficiency 01/28/2021   Pre-ulcerative corn or callous 02/01/2019   Disorder of left Achilles tendon 02/01/2019   Inguinal hernia of left side without obstruction or gangrene 12/08/2016   Former tobacco use 12/08/2016   Localized swelling, mass and lump, multiple sites 12/08/2016   Subcutaneous nodules 07/09/2016   Periodontitis 05/27/2015   Pain in left wrist 05/27/2015  HTN (hypertension) 05/12/2015      Medications:  Current Outpatient Medications:    amLODipine (NORVASC) 10 MG tablet, Take 1 tablet (10 mg total) by mouth daily., Disp: 90 tablet, Rfl: 1   amoxicillin-clavulanate (AUGMENTIN) 875-125 MG tablet, Take 1 tablet by mouth 2 (two) times daily for 7 days, Disp: 14 tablet, Rfl: 0   apixaban (ELIQUIS) 5 MG TABS tablet, Take 1 tablet (5 mg total) by mouth 2 (two) times daily. Start taking after completion of  starter pack., Disp: 60 tablet, Rfl: 5   apixaban (ELIQUIS) 5 MG TABS tablet, Take 1 tablet (5 mg total) by mouth 2 (two) times daily. Restart once post-operative bleeding has stopped (usually 2-3 days after surgery), Disp: 60 tablet, Rfl: 0   carvedilol (COREG) 25 MG tablet, Take 1 tablet (25 mg total) by mouth 2 (two) times daily with a meal., Disp: 180 tablet, Rfl: 1   celecoxib (CELEBREX) 200 MG capsule, Take 1 capsule (200 mg total) by mouth 2 (two) times daily for 14 days., Disp: 28 capsule, Rfl: 0   HYDROcodone-acetaminophen (NORCO/VICODIN) 5-325 MG tablet, Take 1 tablet by mouth every 6 (six) hours as needed for evere pain. (7-10 on pain scale), Disp: 8 tablet, Rfl: 0   losartan (COZAAR) 100 MG tablet, Take 1 tablet (100 mg total) by mouth daily., Disp: 90 tablet, Rfl: 1   methimazole (TAPAZOLE) 10 MG tablet, Take 1 tablet (10 mg total) by mouth daily., Disp: 30 tablet, Rfl: 1   nystatin (MYCOSTATIN) 100000 UNIT/ML suspension, Take 5 mLs (500,000 Units total) by mouth 4 (four) times daily. Swish and swallow., Disp: 120 mL, Rfl: 0   sildenafil (VIAGRA) 100 MG tablet, Take 0.5-1 tablets (50-100 mg total) by mouth daily as needed for erectile dysfunction., Disp: 10 tablet, Rfl: 11   spironolactone (ALDACTONE) 50 MG tablet, Take 1 tablet (50 mg total) by mouth daily., Disp: 90 tablet, Rfl: 1  Observations/Objective: Patient is well-developed, well-nourished in no acute distress.  Resting comfortably  at home.  Head is normocephalic, atraumatic.  No labored breathing.  Speech is clear and coherent with logical content.  Patient is alert and oriented at baseline.    Assessment and Plan:  1. Post-operative pain Advised to start Celebrex as prescribed patient to pick up Rx today   Will provide a one day work note but advised long term leave and FMLA paperwork will need to be completed by surgery team or PCP  Follow up with surgery team as advised for ongoing management post operatively       Follow Up Instructions: I discussed the assessment and treatment plan with the patient. The patient was provided an opportunity to ask questions and all were answered. The patient agreed with the plan and demonstrated an understanding of the instructions.  A copy of instructions were sent to the patient via MyChart unless otherwise noted below.    The patient was advised to call back or seek an in-person evaluation if the symptoms worsen or if the condition fails to improve as anticipated.  Time:  I spent 12 minutes with the patient via telehealth technology discussing the above problems/concerns.    [name] Simas, FNP

## 2022-12-13 ENCOUNTER — Encounter: Payer: Self-pay | Admitting: Internal Medicine

## 2022-12-13 ENCOUNTER — Other Ambulatory Visit: Payer: Self-pay

## 2022-12-13 ENCOUNTER — Other Ambulatory Visit: Payer: Self-pay | Admitting: Internal Medicine

## 2022-12-13 ENCOUNTER — Ambulatory Visit: Payer: 59 | Attending: Internal Medicine | Admitting: Internal Medicine

## 2022-12-13 VITALS — BP 124/74 | HR 80 | Temp 98.3°F | Ht 74.0 in | Wt 212.0 lb

## 2022-12-13 DIAGNOSIS — M5441 Lumbago with sciatica, right side: Secondary | ICD-10-CM

## 2022-12-13 DIAGNOSIS — G8929 Other chronic pain: Secondary | ICD-10-CM

## 2022-12-13 DIAGNOSIS — M5442 Lumbago with sciatica, left side: Secondary | ICD-10-CM | POA: Diagnosis not present

## 2022-12-13 DIAGNOSIS — I10 Essential (primary) hypertension: Secondary | ICD-10-CM | POA: Diagnosis not present

## 2022-12-13 DIAGNOSIS — E042 Nontoxic multinodular goiter: Secondary | ICD-10-CM | POA: Diagnosis not present

## 2022-12-13 DIAGNOSIS — C119 Malignant neoplasm of nasopharynx, unspecified: Secondary | ICD-10-CM | POA: Diagnosis not present

## 2022-12-13 MED ORDER — METHOCARBAMOL 750 MG PO TABS
750.0000 mg | ORAL_TABLET | Freq: Two times a day (BID) | ORAL | 1 refills | Status: DC | PRN
Start: 2022-12-13 — End: 2023-03-08
  Filled 2022-12-13 – 2023-01-21 (×3): qty 60, 30d supply, fill #0

## 2022-12-13 MED ORDER — METHOCARBAMOL 750 MG PO TABS
750.0000 mg | ORAL_TABLET | Freq: Two times a day (BID) | ORAL | 1 refills | Status: DC | PRN
Start: 2022-12-13 — End: 2022-12-13
  Filled 2022-12-13: qty 60, 30d supply, fill #0

## 2022-12-13 NOTE — Progress Notes (Addendum)
Patient ID: [name], male    DOB: [birth year]  MRN: [id number]  CC: Hypertension (HTN f/u. Med refill - methocarbamol 720 mg /Intermittent soreness of back radiating to L & R leg x2 days/No to flu vax. )   Subjective: [name] is a 47 y.o. male who presents for chronic ds management. His concerns today include:  Hx of poorly controlled  HTN, thyroid nodules, Vit D def,  preDM, tob dep, poor dentition, chronic lower back pain, history of reflex syncope, callus of left Achilles tendon.    Since last visit, patient has had surgery for nasopharyngeal CA at [facility]. -Patient states that he heard from ENT today who confirmed that it is cancerous.  I was able to pull up a phone message from where she was trying to reach him yesterday.  Biopsy showed bio phenotypic sinonasal sarcoma.  States he was told he would not need XRT but she has referred him to another specialist for second opinion.  He has follow-up with her on the 17th of this month.  Thyroid nodules: He is seen endocrinology Dr. Allena Katz.  He had FNA on the right mid and right inferior nodules and pathology was benign.  HTN: Blood pressure looks good today.  Reports compliance with his medications and low-salt diet.  Tobacco use: Reports he has cut back significantly on smoking cigars.  Smokes 1 cigar every now and then.  Complains of chronic pain in his lower back which she has had for years but feels that it has gotten worse over the years.  Pain is on both sides and radiates down both legs.  No numbness or tingling.  Worse by the end of his workday and first thing in the mornings.  He feels his legs are little weak at times.  Pain is associated with a lot of spasms in the lower back.  He points from the mid thoracic to the lower lumbar paraspinal muscles. -He works for [employer] and does a lot of lifting at work.  Maximum weight that he is required to lift is about 75 pounds but does not lift that heavy load too  often. -Rates pain as 8-9/10 most of the time. -Gets relief with taking Tylenol and Robaxin 750 mg.  He takes Robaxin twice a day.  Request refill on Robaxin. -Had telehealth visit the end of August and again on 11/14/2022 for back pain issues. Had x-ray done on the lumbar spine in February of this year that showed mild L4-L5 degenerative disc disease with bilateral sacroiliac joint sclerosis more prominent on the left favored degenerative change.  Would like to have FMLA completed to cover him for days when he has to be out of work due to his back or other health issues. 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   Pulmonary embolism on right (HCC) 09/24/2022   Nasal cavity mass 09/24/2022   Acute deep vein thrombosis (DVT) of femoral vein of right lower extremity (HCC) 08/21/2022   Neck pain 08/14/2021   Prediabetes 01/28/2021   Vitamin D deficiency 01/28/2021   Pre-ulcerative corn or callous 02/01/2019   Disorder of left Achilles tendon 02/01/2019   Inguinal hernia of left side without obstruction or gangrene 12/08/2016   Former tobacco use 12/08/2016   Localized swelling, mass and lump, multiple sites 12/08/2016   Subcutaneous nodules 07/09/2016   Periodontitis 05/27/2015   Pain in left wrist 05/27/2015   HTN (hypertension) 05/12/2015  Current Outpatient Medications on File Prior to Visit  Medication Sig Dispense Refill   amLODipine (NORVASC) 10 MG tablet Take 1 tablet (10 mg total) by mouth daily. 90 tablet 1   apixaban (ELIQUIS) 5 MG TABS tablet Take 1 tablet (5 mg total) by mouth 2 (two) times daily. Start taking after completion of starter pack. 60 tablet 5   apixaban (ELIQUIS) 5 MG TABS tablet Take 1 tablet (5 mg total) by mouth 2 (two) times daily. Restart once post-operative bleeding has stopped (usually 2-3 days after surgery) 60 tablet 0   carvedilol (COREG) 25 MG tablet Take 1 tablet (25 mg total) by mouth 2 (two)  times daily with a meal. 180 tablet 1   celecoxib (CELEBREX) 200 MG capsule Take 1 capsule (200 mg total) by mouth 2 (two) times daily for 14 days. 28 capsule 0   losartan (COZAAR) 100 MG tablet Take 1 tablet (100 mg total) by mouth daily. 90 tablet 1   methimazole (TAPAZOLE) 10 MG tablet Take 1 tablet (10 mg total) by mouth daily. 30 tablet 1   nystatin (MYCOSTATIN) 100000 UNIT/ML suspension Take 5 mLs (500,000 Units total) by mouth 4 (four) times daily. Swish and swallow. 120 mL 0   sildenafil (VIAGRA) 100 MG tablet Take 0.5-1 tablets (50-100 mg total) by mouth daily as needed for erectile dysfunction. 10 tablet 11   spironolactone (ALDACTONE) 50 MG tablet Take 1 tablet (50 mg total) by mouth daily. 90 tablet 1   No current facility-administered medications on file prior to visit.    Allergies  Allergen Reactions   Doxycycline Other (See Comments)    Possible allergen. Developed Bell's Palsy the day after taking antibiotic.   Penicillins Swelling and Rash    Has patient had a PCN reaction causing immediate rash, facial/tongue/throat swelling, SOB or lightheadedness with hypotension: YesYes Has patient had a PCN reaction causing severe rash involving mucus membranes or skin necrosis: NoNo Has patient had a PCN reaction that required hospitalization YesYes Has patient had a PCN reaction occurring within the last 10 years: NoNo If all of the above answers are "NO", then may proceed with Cephalospori    Social History   Socioeconomic History   Marital status: Single    Spouse name: Not on file   Number of children: 3   Years of education: Not on file   Highest education level: Not on file  Occupational History   Occupation: manual labor  Tobacco Use   Smoking status: Former    Current packs/day: 0.00    Types: Cigars, Cigarettes    Quit date: 05/10/2015    Years since quitting: 7.6   Smokeless tobacco: Never   Tobacco comments:    Occ black and  milds   Vaping Use   Vaping  status: Never Used  Substance and Sexual Activity   Alcohol use: Yes    Comment: occ   Drug use: Yes    Types: Marijuana   Sexual activity: Not on file  Other Topics Concern   Not on file  Social History Narrative   Not on file   Social Determinants of Health   Financial Resource Strain: Low Risk  (05/31/2022)   Overall Financial Resource Strain (CARDIA)    Difficulty of Paying Living Expenses: Not very hard  Food Insecurity: No Food Insecurity (05/31/2022)   Hunger Vital Sign    Worried About Running Out of Food in the Last Year: Never true    Ran Out of Food in the  Last Year: Never true  Transportation Needs: No Transportation Needs (05/31/2022)   PRAPARE - Administrator, Civil Service (Medical): No    Lack of Transportation (Non-Medical): No  Physical Activity: Unknown (08/07/2022)   Exercise Vital Sign    Days of Exercise per Week: 5 days    Minutes of Exercise per Session: Patient declined  Recent Concern: Physical Activity - Inactive (05/31/2022)   Exercise Vital Sign    Days of Exercise per Week: 0 days    Minutes of Exercise per Session: 0 min  Stress: No Stress Concern Present (08/07/2022)   [employer] of Occupational Health - Occupational Stress Questionnaire    Feeling of Stress : Only a little  Social Connections: Unknown (08/07/2022)   Social Connection and Isolation Panel [NHANES]    Frequency of Communication with Friends and Family: Twice a week    Frequency of Social Gatherings with Friends and Family: Twice a week    Attends Religious Services: Never    Database administrator or Organizations: No    Attends Engineer, structural: Never    Marital Status: Patient declined  Recent Concern: Social Connections - Moderately Isolated (05/31/2022)   Social Connection and Isolation Panel [NHANES]    Frequency of Communication with Friends and Family: Twice a week    Frequency of Social Gatherings with Friends and Family: Twice a week     Attends Religious Services: Never    Database administrator or Organizations: No    Attends Banker Meetings: Never    Marital Status: Living with partner  Intimate Partner Violence: Not At Risk (05/31/2022)   Humiliation, Afraid, Rape, and Kick questionnaire    Fear of Current or Ex-Partner: No    Emotionally Abused: No    Physically Abused: No    Sexually Abused: No    Family History  Problem Relation Age of Onset   Cancer Mother        breast cancer    Past Surgical History:  Procedure Laterality Date   CHOLECYSTECTOMY      ROS: Review of Systems Negative except as stated above  PHYSICAL EXAM: BP 124/74 (BP Location: Left Arm, Patient Position: Sitting, Cuff Size: Normal)   Pulse 80   Temp 98.3 F (36.8 C) (Oral)   Ht 6\' 2"  (1.88 m)   Wt 212 lb (96.2 kg)   SpO2 96%   BMI 27.22 kg/m   Physical Exam   General appearance - alert, well appearing, and in no distress Mental status - normal mood, behavior, speech, dress, motor activity, and thought processes Chest - clear to auscultation, no wheezes, rales or rhonchi, symmetric air entry Heart - normal rate, regular rhythm, normal S1, S2, no murmurs, rubs, clicks or gallops Neurological -power lower extremities 5/5 proximally and distally.  Gross sensation intact in both lower extremities.  Gait appears normal. Extremities -no lower extremity edema.     Latest Ref Rng & Units 11/13/2022   11:49 AM 08/18/2022    9:26 PM 08/13/2022   11:30 AM  CMP  Glucose 70 - 99 mg/dL 469  629  528   BUN 6 - 20 mg/dL 9  15  12    Creatinine 0.61 - 1.24 mg/dL 4.13  2.44  0.10   Sodium 135 - 145 mmol/L 137  135  139   Potassium 3.5 - 5.1 mmol/L 4.0  4.3  4.4   Chloride 98 - 111 mmol/L 102  99  100  CO2 22 - 32 mmol/L 30  28  24    Calcium 8.9 - 10.3 mg/dL 9.2  8.7  9.2   Total Protein 6.5 - 8.1 g/dL 7.8     Total Bilirubin 0.3 - 1.2 mg/dL 0.3     Alkaline Phos 38 - 126 U/L 89     AST 15 - 41 U/L 8     ALT 0 - 44 U/L 11       Lipid Panel     Component Value Date/Time   CHOL 129 10/19/2020 1136   TRIG 114 10/19/2020 1136   HDL 39 (L) 10/19/2020 1136   CHOLHDL 3.3 10/19/2020 1136   LDLCALC 69 10/19/2020 1136    CBC    Component Value Date/Time   WBC 7.0 11/13/2022 1149   WBC 7.3 08/18/2022 2126   RBC 5.20 11/13/2022 1149   HGB 15.1 11/13/2022 1149   HGB 17.4 04/05/2022 1010   HCT 43.8 11/13/2022 1149   HCT 51.9 (H) 04/05/2022 1010   PLT 300 11/13/2022 1149   PLT 285 04/05/2022 1010   MCV 84.2 11/13/2022 1149   MCV 86 04/05/2022 1010   MCH 29.0 11/13/2022 1149   MCHC 34.5 11/13/2022 1149   RDW 13.1 11/13/2022 1149   RDW 12.9 04/05/2022 1010   LYMPHSABS 2.7 11/13/2022 1149   LYMPHSABS 2.7 04/05/2022 1010   MONOABS 0.4 11/13/2022 1149   EOSABS 0.1 11/13/2022 1149   EOSABS 0.1 04/05/2022 1010   BASOSABS 0.1 11/13/2022 1149   BASOSABS 0.0 04/05/2022 1010    ASSESSMENT AND PLAN: 1. Essential hypertension At goal.  Continue amlodipine 10 mg daily, carvedilol 25 mg twice a day, Cozaar 100 mg daily and spironolactone 50 mg daily.  2. Chronic bilateral low back pain with bilateral sciatica Patient with chronic lower back pain that has progressively gotten worse over the past years.  He has some sciatica.  We discussed getting MRI of the lower back.  We will try to get prior approval through his insurance. We discussed giving a permanent work restriction of no lifting more than 30 pounds but patient wants to hold off on that until we get the results of his MRI.  He feels it may not go over well at work for him to have a permanent restriction. He will continue Robaxin and Tylenol as needed. - MR Lumbar Spine Wo Contrast; Future - methocarbamol (ROBAXIN) 750 MG tablet; Take 1 tablet (750 mg total) by mouth 2 (two) times daily as needed for muscle spasms. 4 times a day for 10 days  Dispense: 60 tablet; Refill: 1  3. Nasopharyngeal cancer (HCC) After the patient left I was able to access the phone  note from his ENT specialist.  Looks like he has a sarcoma and will be seeing another specialist for second opinion in terms of further management.  4. Multiple thyroid nodules FNA done by endocrinologist revealed benign findings.           Addendum:  Pt sent Mychart message stating [facility] does not accept his insurance pt [facility] does.  Request MRI be ordered through [facility].  I will resubmit order.     Patient was given the opportunity to ask questions.  Patient verbalized understanding of the plan and was able to repeat key elements of the plan.   This documentation was completed using Paediatric nurse.  Any transcriptional errors are unintentional.  Orders Placed This Encounter  Procedures   MR Lumbar Spine Wo Contrast  Requested Prescriptions   Signed Prescriptions Disp Refills   methocarbamol (ROBAXIN) 750 MG tablet 60 tablet 1    Sig: Take 1 tablet (750 mg total) by mouth 2 (two) times daily as needed for muscle spasms. 4 times a day for 10 days    Return in about 4 months (around 04/15/2023).  Jonah Blue, MD, FACP

## 2022-12-14 ENCOUNTER — Other Ambulatory Visit: Payer: Self-pay

## 2022-12-20 ENCOUNTER — Other Ambulatory Visit: Payer: Self-pay

## 2022-12-25 ENCOUNTER — Other Ambulatory Visit: Payer: Self-pay

## 2022-12-26 ENCOUNTER — Telehealth: Payer: Self-pay | Admitting: Internal Medicine

## 2022-12-26 NOTE — Telephone Encounter (Signed)
Patient is a Clinical cytogeneticist message stating that [facility] imaging does not take his insurance but [facility] radiology does.  Please call: Radiology and schedule the MRI of the lumbar spine.

## 2022-12-26 NOTE — Addendum Note (Signed)
Addended by: Jonah Blue B on: 12/26/2022 05:43 PM   Modules accepted: Orders

## 2022-12-28 ENCOUNTER — Telehealth: Payer: 59 | Admitting: Family Medicine

## 2022-12-28 DIAGNOSIS — J3489 Other specified disorders of nose and nasal sinuses: Secondary | ICD-10-CM

## 2022-12-28 DIAGNOSIS — R0981 Nasal congestion: Secondary | ICD-10-CM

## 2022-12-28 DIAGNOSIS — G8918 Other acute postprocedural pain: Secondary | ICD-10-CM | POA: Diagnosis not present

## 2022-12-28 NOTE — Patient Instructions (Signed)
Sinus Endoscopy, Care After After a sinus endoscopy, it is common to have: Discomfort in the sinus area. Some bleeding. Irritation or damage to the lining of the: Nose. Mouth. Throat. Sinuses. If you had treatments done during your procedure, you may also have: A headache. Congestion. Fluid coming from your nose. A dry nose. Follow these instructions at home: Your health care provider may give you more instructions. If you have problems, contact your health care provider. Medicines Take or use over-the-counter and prescription medicines only as told by your health care provider. If you were prescribed an antibiotic medicine, use it as told by your health care provider. Do not stop using it even if you start to feel better. Use nose sprays and rinses as told by your health care provider. General instructions     Avoid blowing your nose and sneezing. If you were given a sedative during your procedure, do not drive or use machines until your health care provider says that it is safe. A sedative is a medicine that helps you relax. Keep your head raised (elevated) for the first few nights after the procedure, or as told by your health care provider. This helps to decrease inflammation. Do not smoke or use any products that contain nicotine or tobacco. If you need help quitting, ask your health care provider. Return to your normal activities when your health care provider says that it is safe. Keep all follow-up visits. Contact a health care provider if: You have pain or discomfort that does not get better with over-the-counter medicine. You have a fever. You have more clear fluid or blood coming from your nose. You have pus or a bad smell coming from your nose. You have nausea. You start to vomit. Get help right away if: You have bleeding from your nose that does not stop. You have changes in your vision. You cannot stop vomiting. These symptoms may be an emergency. Get help right  away. Call 911. Do not wait to see if the symptoms will go away. Do not drive yourself to the hospital. Summary After this procedure, it is common to have discomfort in the sinus area. If you were given an antibiotic medicine, use it as told by your health care provider. Do not stop using it even if you start to feel better. Avoid blowing your nose and sneezing after the procedure. Keep your head raised (elevated) for the first few nights after the procedure, or as told by your health care provider. This helps to decrease inflammation. Get help right away if you have bleeding from your nose that does not stop. This information is not intended to replace advice given to you by your health care provider. Make sure you discuss any questions you have with your health care provider. Document Revised: 02/15/2021 Document Reviewed: 02/15/2021 Elsevier Patient Education  2024 [employer].

## 2022-12-28 NOTE — Progress Notes (Signed)
Note resent-DWB

## 2022-12-28 NOTE — Progress Notes (Signed)
Virtual Visit Consent   [name], you are scheduled for a virtual visit with a [facility] provider today. Just as with appointments in the office, your consent must be obtained to participate. Your consent will be active for this visit and any virtual visit you may have with one of our providers in the next 365 days. If you have a MyChart account, a copy of this consent can be sent to you electronically.  As this is a virtual visit, video technology does not allow for your provider to perform a traditional examination. This may limit your provider's ability to fully assess your condition. If your provider identifies any concerns that need to be evaluated in person or the need to arrange testing (such as labs, EKG, etc.), we will make arrangements to do so. Although advances in technology are sophisticated, we cannot ensure that it will always work on either your end or our end. If the connection with a video visit is poor, the visit may have to be switched to a telephone visit. With either a video or telephone visit, we are not always able to ensure that we have a secure connection.  By engaging in this virtual visit, you consent to the provision of healthcare and authorize for your insurance to be billed (if applicable) for the services provided during this visit. Depending on your insurance coverage, you may receive a charge related to this service.  I need to obtain your verbal consent now. Are you willing to proceed with your visit today? [name] has provided verbal consent on 12/28/2022 for a virtual visit (video or telephone). Georgana Curio, FNP  Date: 12/28/2022 9:32 AM  Virtual Visit via Video Note   I, Georgana Curio, connected with  [name]  ([id number], 08-13-1975) on 12/28/22 at 10:15 AM EDT by a video-enabled telemedicine application and verified that I am speaking with the correct person using two identifiers.  Location: Patient: Virtual Visit Location Patient:  Home Provider: Virtual Visit Location Provider: Home Office   I discussed the limitations of evaluation and management by telemedicine and the availability of in person appointments. The patient expressed understanding and agreed to proceed.    History of Present Illness: [name] is a 47 y.o. who identifies as a male who was assigned male at birth, and is being seen today for rt sided sinus pain. He has sinus surgery on 11/21/22 and had developed a cold. He is trying to call his surgeon. He missed post op follow up yesterday due to sx. No fever. 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   Acute deep vein thrombosis (DVT) of femoral vein of right lower extremity (HCC) 08/21/2022   Neck pain 08/14/2021   Prediabetes 01/28/2021   Vitamin D deficiency 01/28/2021   Pre-ulcerative corn or callous 02/01/2019   Disorder of left Achilles tendon 02/01/2019   Inguinal hernia of left side without obstruction or gangrene 12/08/2016   Former tobacco use 12/08/2016   Localized swelling, mass and lump, multiple sites 12/08/2016   Subcutaneous nodules 07/09/2016   Periodontitis 05/27/2015   Pain in left wrist 05/27/2015   HTN (hypertension) 05/12/2015    Allergies:   Medications:  Current Outpatient Medications:    amLODipine (NORVASC) 10 MG tablet, Take 1 tablet (10 mg total) by mouth daily., Disp: 90  tablet, Rfl: 1   apixaban (ELIQUIS) 5 MG TABS tablet, Take 1 tablet (5 mg total) by mouth 2 (two) times daily. Start taking after completion of starter pack., Disp: 60 tablet, Rfl: 5   apixaban (ELIQUIS) 5 MG TABS tablet, Take 1 tablet (5 mg total) by mouth 2 (two) times daily. Restart once post-operative bleeding has stopped (usually 2-3 days after surgery), Disp: 60 tablet, Rfl: 0   carvedilol (COREG) 25 MG tablet, Take 1 tablet (25 mg  total) by mouth 2 (two) times daily with a meal., Disp: 180 tablet, Rfl: 1   losartan (COZAAR) 100 MG tablet, Take 1 tablet (100 mg total) by mouth daily., Disp: 90 tablet, Rfl: 1   methimazole (TAPAZOLE) 10 MG tablet, Take 1 tablet (10 mg total) by mouth daily., Disp: 30 tablet, Rfl: 1   methocarbamol (ROBAXIN) 750 MG tablet, Take 1 tablet (750 mg total) by mouth 2 (two) times daily as needed for muscle spasms., Disp: 60 tablet, Rfl: 1   nystatin (MYCOSTATIN) 100000 UNIT/ML suspension, Take 5 mLs (500,000 Units total) by mouth 4 (four) times daily. Swish and swallow., Disp: 120 mL, Rfl: 0   sildenafil (VIAGRA) 100 MG tablet, Take 0.5-1 tablets (50-100 mg total) by mouth daily as needed for erectile dysfunction., Disp: 10 tablet, Rfl: 11   spironolactone (ALDACTONE) 50 MG tablet, Take 1 tablet (50 mg total) by mouth daily., Disp: 90 tablet, Rfl: 1  Observations/Objective: Patient is well-developed, well-nourished in no acute distress.  Resting comfortably  at home.  Head is normocephalic, atraumatic.  No labored breathing.  Speech is clear and coherent with logical content.  Patient is alert and oriented at baseline.    Assessment and Plan: 1. Sinus pain  2. Post-operative pain  Follow up with pcp today for further instructions. Written OOW today.   Follow Up Instructions: I discussed the assessment and treatment plan with the patient. The patient was provided an opportunity to ask questions and all were answered. The patient agreed with the plan and demonstrated an understanding of the instructions.  A copy of instructions were sent to the patient via MyChart unless otherwise noted below.     The patient was advised to call back or seek an in-person evaluation if the symptoms worsen or if the condition fails to improve as anticipated.    Georgana Curio, FNP

## 2022-12-31 NOTE — Telephone Encounter (Signed)
Patient has an appointment scheduled for MRI of the lumbar spine on 01/03/23. No further assistance required at this time.

## 2023-01-01 NOTE — Telephone Encounter (Signed)
[name] needing [facility] for the [facility]  CB@  [phone] x [phone]

## 2023-01-01 NOTE — Telephone Encounter (Signed)
Prior authorization for MRI of the lumbar spine has been approved.  Approval dates: 01/03/2023 - 01/31/2023 Case #: [id number]

## 2023-01-02 ENCOUNTER — Telehealth: Payer: Self-pay

## 2023-01-02 NOTE — Telephone Encounter (Signed)
Copied from CRM [id number]. Topic: General - Other >> Jan 02, 2023 12:29 PM Phill Myron wrote: Per Efraim Kaufmann of [facility].  He needs authorization for his MRI  scheduled for tomorrow. ... [employer] insurance needs to be authorized .Marland KitchenMarland KitchenPLease advise as soon as possible  [phone]  ext [phone]

## 2023-01-03 ENCOUNTER — Telehealth: Payer: Self-pay | Admitting: Internal Medicine

## 2023-01-03 ENCOUNTER — Ambulatory Visit (HOSPITAL_COMMUNITY): Payer: 59

## 2023-01-03 NOTE — Telephone Encounter (Signed)
Spoke with patient . Advised patient the we are showing the he has 2 insurances and Medicaid is considering [employer] to be his primary. Advised patient to call [facility] and tell them he want to cancel with them. [employer] F[employer] ins will not approve MRI and Medicaid will not approve because Jari Favre is the primary insurance.

## 2023-01-03 NOTE — Telephone Encounter (Signed)
Let [name] radiology know that patient has been approved for the MRI of the lumbar spine by [employer].  Approval date is 10/22 - 01/31/2023

## 2023-01-04 ENCOUNTER — Telehealth: Payer: Self-pay | Admitting: Internal Medicine

## 2023-01-04 NOTE — Telephone Encounter (Signed)
Copied from CRM [id number]. Topic: General - Other >> Jan 04, 2023  1:42 PM Franchot Heidelberg wrote: Reason for CRM: Pt called requesting to speak to Elonda Husky, he says he is returning her call regarding insurance  Best contact:  [phone]

## 2023-01-07 NOTE — Telephone Encounter (Signed)
Call placed to patient unable to reach unable to leave message on VM.

## 2023-01-09 ENCOUNTER — Other Ambulatory Visit: Payer: Self-pay

## 2023-01-09 ENCOUNTER — Other Ambulatory Visit: Payer: Self-pay | Admitting: Internal Medicine

## 2023-01-09 ENCOUNTER — Other Ambulatory Visit (HOSPITAL_COMMUNITY): Payer: Self-pay

## 2023-01-09 DIAGNOSIS — I10 Essential (primary) hypertension: Secondary | ICD-10-CM

## 2023-01-09 DIAGNOSIS — C801 Malignant (primary) neoplasm, unspecified: Secondary | ICD-10-CM | POA: Diagnosis not present

## 2023-01-09 DIAGNOSIS — Z9889 Other specified postprocedural states: Secondary | ICD-10-CM | POA: Diagnosis not present

## 2023-01-09 MED ORDER — SPIRONOLACTONE 50 MG PO TABS
50.0000 mg | ORAL_TABLET | Freq: Every day | ORAL | 1 refills | Status: DC
  Filled 2023-01-09 – 2023-01-21 (×2): qty 90, 90d supply, fill #0
  Filled 2023-05-19: qty 90, 90d supply, fill #1

## 2023-01-09 MED ORDER — AMLODIPINE BESYLATE 10 MG PO TABS
10.0000 mg | ORAL_TABLET | Freq: Every day | ORAL | 1 refills | Status: DC
  Filled 2023-01-09 – 2023-01-21 (×2): qty 90, 90d supply, fill #0
  Filled 2023-05-19: qty 90, 90d supply, fill #1

## 2023-01-09 MED ORDER — BUDESONIDE 0.5 MG/2ML IN SUSP
4.0000 mL | Freq: Every day | RESPIRATORY_TRACT | 11 refills | Status: AC
Start: 2023-01-09 — End: ?
  Filled 2023-01-09 – 2023-01-29 (×2): qty 120, 30d supply, fill #0

## 2023-01-09 MED ORDER — LOSARTAN POTASSIUM 100 MG PO TABS
100.0000 mg | ORAL_TABLET | Freq: Every day | ORAL | 1 refills | Status: DC
  Filled 2023-01-09 – 2023-01-21 (×2): qty 90, 90d supply, fill #0
  Filled 2023-05-19: qty 90, 90d supply, fill #1

## 2023-01-09 MED ORDER — CARVEDILOL 25 MG PO TABS
25.0000 mg | ORAL_TABLET | Freq: Two times a day (BID) | ORAL | 1 refills | Status: DC
  Filled 2023-01-09 – 2023-05-19 (×3): qty 180, 90d supply, fill #0

## 2023-01-09 MED ORDER — METHIMAZOLE 10 MG PO TABS
10.0000 mg | ORAL_TABLET | Freq: Every day | ORAL | 1 refills | Status: DC
  Filled 2023-01-09 – 2023-05-19 (×3): qty 90, 90d supply, fill #0

## 2023-01-10 ENCOUNTER — Other Ambulatory Visit (HOSPITAL_COMMUNITY): Payer: Self-pay

## 2023-01-10 NOTE — Telephone Encounter (Signed)
Spoke with patient . Patient voice that she has cancelled [employer] . Voiced that he now has been proved for the MRI and he has scheduled the appointment.

## 2023-01-16 ENCOUNTER — Ambulatory Visit (HOSPITAL_COMMUNITY): Payer: Medicaid Other

## 2023-01-21 ENCOUNTER — Other Ambulatory Visit: Payer: Self-pay

## 2023-01-24 ENCOUNTER — Ambulatory Visit (HOSPITAL_COMMUNITY)
Admission: RE | Admit: 2023-01-24 | Discharge: 2023-01-24 | Disposition: A | Discharge status: Home / Self Care | Payer: Medicaid Other | Source: Ambulatory Visit | Attending: Internal Medicine | Admitting: Internal Medicine

## 2023-01-24 DIAGNOSIS — M47816 Spondylosis without myelopathy or radiculopathy, lumbar region: Secondary | ICD-10-CM | POA: Diagnosis not present

## 2023-01-24 DIAGNOSIS — G8929 Other chronic pain: Secondary | ICD-10-CM | POA: Diagnosis not present

## 2023-01-24 DIAGNOSIS — M5441 Lumbago with sciatica, right side: Secondary | ICD-10-CM | POA: Diagnosis not present

## 2023-01-24 DIAGNOSIS — M5442 Lumbago with sciatica, left side: Secondary | ICD-10-CM | POA: Insufficient documentation

## 2023-01-24 DIAGNOSIS — M5126 Other intervertebral disc displacement, lumbar region: Secondary | ICD-10-CM | POA: Diagnosis not present

## 2023-01-24 DIAGNOSIS — M5127 Other intervertebral disc displacement, lumbosacral region: Secondary | ICD-10-CM | POA: Diagnosis not present

## 2023-01-24 DIAGNOSIS — M48061 Spinal stenosis, lumbar region without neurogenic claudication: Secondary | ICD-10-CM | POA: Diagnosis not present

## 2023-01-28 ENCOUNTER — Other Ambulatory Visit: Payer: Self-pay

## 2023-01-29 ENCOUNTER — Other Ambulatory Visit (HOSPITAL_COMMUNITY): Payer: Self-pay

## 2023-01-29 ENCOUNTER — Other Ambulatory Visit: Payer: Self-pay

## 2023-01-29 ENCOUNTER — Encounter: Payer: Self-pay | Admitting: Internal Medicine

## 2023-01-30 ENCOUNTER — Telehealth: Payer: Self-pay | Admitting: Internal Medicine

## 2023-01-30 DIAGNOSIS — M5416 Radiculopathy, lumbar region: Secondary | ICD-10-CM

## 2023-01-30 DIAGNOSIS — E041 Nontoxic single thyroid nodule: Secondary | ICD-10-CM

## 2023-01-30 NOTE — Telephone Encounter (Signed)
Phone call placed to patient this afternoon to his discuss results of MRI of the lumbar spine.  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  I recommend referral to neurosurgeon for further evaluation.  Patient is agreeable to this. I also inquired from patient when he last saw Dr. Allena Katz the endocrinologist, whether he indicated to continue methimazole or not.  Patient states that Dr. Chestine Spore the ENT specialist told him that he does not need to continue it.  He took his last dose around October 26.  I request that he comes to the lab the end of this month or early next month to have thyroid level checked.  He is agreeable to doing so. Patient also had a question of whether we need to do ridging studies again to check to see if the clot in his lung has resolved with the blood thinner.  I told him at this time I do not think we need to.  The main question is how long he would need to be on the blood thinner.  I think he will need to stay on it for now until he is declared cancer free.  He has an appointment coming up later this month with hematology oncology again Dr. Lin Givens.  I told him that he can ask this question of the specialist as well.

## 2023-02-01 ENCOUNTER — Other Ambulatory Visit: Payer: Self-pay

## 2023-02-01 ENCOUNTER — Telehealth: Payer: Medicaid Other | Admitting: Family Medicine

## 2023-02-01 DIAGNOSIS — M51369 Other intervertebral disc degeneration, lumbar region without mention of lumbar back pain or lower extremity pain: Secondary | ICD-10-CM

## 2023-02-01 NOTE — Progress Notes (Signed)
Virtual Visit Consent   [name], you are scheduled for a virtual visit with a [facility] provider today. Just as with appointments in the office, your consent must be obtained to participate. Your consent will be active for this visit and any virtual visit you may have with one of our providers in the next 365 days. If you have a MyChart account, a copy of this consent can be sent to you electronically.  As this is a virtual visit, video technology does not allow for your provider to perform a traditional examination. This may limit your provider's ability to fully assess your condition. If your provider identifies any concerns that need to be evaluated in person or the need to arrange testing (such as labs, EKG, etc.), we will make arrangements to do so. Although advances in technology are sophisticated, we cannot ensure that it will always work on either your end or our end. If the connection with a video visit is poor, the visit may have to be switched to a telephone visit. With either a video or telephone visit, we are not always able to ensure that we have a secure connection.  By engaging in this virtual visit, you consent to the provision of healthcare and authorize for your insurance to be billed (if applicable) for the services provided during this visit. Depending on your insurance coverage, you may receive a charge related to this service.  I need to obtain your verbal consent now. Are you willing to proceed with your visit today? [name] has provided verbal consent on 02/01/2023 for a virtual visit (video or telephone). Georgana Curio, FNP  Date: 02/01/2023 8:36 AM  Virtual Visit via Video Note   I, Georgana Curio, connected with  [name]  ([id number], 11-07-1975) on 02/01/23 at  8:45 AM EST by a video-enabled telemedicine application and verified that I am speaking with the correct person using two identifiers.  Location: Patient: Virtual Visit Location Patient:  Home Provider: Virtual Visit Location Provider: Home Office   I discussed the limitations of evaluation and management by telemedicine and the availability of in person appointments. The patient expressed understanding and agreed to proceed.    History of Present Illness: [name] is a 47 y.o. who identifies as a male who was assigned male at birth, and is being seen today for low back pain. He requests a work note for today. See recent MRI report with 3 bulging discs in lumbar spine. 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 He has been referred to neurosurgery. 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   Acute deep vein thrombosis (DVT) of femoral vein of right lower extremity (HCC) 08/21/2022   Neck pain 08/14/2021   Prediabetes 01/28/2021   Vitamin D deficiency 01/28/2021   Pre-ulcerative corn or callous 02/01/2019   Disorder of left Achilles tendon 02/01/2019   Inguinal hernia of left side without obstruction or gangrene 12/08/2016   Former tobacco use 12/08/2016   Localized swelling, mass and lump, multiple sites 12/08/2016   Subcutaneous nodules 07/09/2016   Periodontitis 05/27/2015   Pain in left wrist 05/27/2015   HTN (hypertension) 05/12/2015    Allergies:   Medications:  Current Outpatient Medications:    amLODipine (NORVASC) 10 MG tablet, Take 1 tablet (10 mg total) by mouth daily., Disp: 90 tablet, Rfl:  1   apixaban (ELIQUIS) 5 MG TABS tablet, Take 1 tablet (5 mg total) by mouth 2 (two) times daily. Start taking after completion of starter pack., Disp: 60 tablet, Rfl: 5   apixaban (ELIQUIS) 5 MG TABS tablet, Take 1 tablet (5 mg total) by mouth 2 (two) times daily. Restart once post-operative bleeding has stopped (usually 2-3 days after surgery), Disp: 60 tablet, Rfl: 0   budesonide (PULMICORT) 0.5 MG/2ML nebulizer solution,  Mix 4 mLs (1 mg total/2 respules) itno 240 ml of nasal saline irrigation once daily., Disp: 120 mL, Rfl: 11   carvedilol (COREG) 25 MG tablet, Take 1 tablet (25 mg total) by mouth 2 (two) times daily with a meal., Disp: 180 tablet, Rfl: 1   losartan (COZAAR) 100 MG tablet, Take 1 tablet (100 mg total) by mouth daily., Disp: 90 tablet, Rfl: 1   methimazole (TAPAZOLE) 10 MG tablet, Take 1 tablet (10 mg total) by mouth daily., Disp: 90 tablet, Rfl: 1   methocarbamol (ROBAXIN) 750 MG tablet, Take 1 tablet (750 mg total) by mouth 2 (two) times daily as needed for muscle spasms., Disp: 60 tablet, Rfl: 1   nystatin (MYCOSTATIN) 100000 UNIT/ML suspension, Take 5 mLs (500,000 Units total) by mouth 4 (four) times daily. Swish and swallow., Disp: 120 mL, Rfl: 0   sildenafil (VIAGRA) 100 MG tablet, Take 0.5-1 tablets (50-100 mg total) by mouth daily as needed for erectile dysfunction., Disp: 10 tablet, Rfl: 11   spironolactone (ALDACTONE) 50 MG tablet, Take 1 tablet (50 mg total) by mouth daily., Disp: 90 tablet, Rfl: 1  Observations/Objective: Patient is well-developed, well-nourished in no acute distress.  Resting comfortably  at home.  Head is normocephalic, atraumatic.  No labored breathing.  Speech is clear and coherent with logical content.  Patient is alert and oriented at baseline.    Assessment and Plan: 1. Bulging lumbar disc  Start meds today and follow up with pcp and neurosurgeon as planned. See ntoes.   Follow Up Instructions: I discussed the assessment and treatment plan with the patient. The patient was provided an opportunity to ask questions and all were answered. The patient agreed with the plan and demonstrated an understanding of the instructions.  A copy of instructions were sent to the patient via MyChart unless otherwise noted below.     The patient was advised to call back or seek an in-person evaluation if the symptoms worsen or if the condition fails to improve as  anticipated.    Georgana Curio, FNP

## 2023-02-01 NOTE — Patient Instructions (Signed)
Low Back Sprain or Strain Rehab Ask your health care provider which exercises are safe for you. Do exercises exactly as told by your health care provider and adjust them as directed. It is normal to feel mild stretching, pulling, tightness, or discomfort as you do these exercises. Stop right away if you feel sudden pain or your pain gets worse. Do not begin these exercises until told by your health care provider. Stretching and range-of-motion exercises These exercises warm up your muscles and joints and improve the movement and flexibility of your back. These exercises also help to relieve pain, numbness, and tingling. Lumbar rotation  Lie on your back on a firm bed or the floor with your knees bent. Straighten your arms out to your sides so each arm forms a 90-degree angle (right angle) with a side of your body. Slowly move (rotate) both of your knees to one side of your body until you feel a stretch in your lower back (lumbar). Try not to let your shoulders lift off the floor. Hold this position for __________ seconds. Tense your abdominal muscles and slowly move your knees back to the starting position. Repeat this exercise on the other side of your body. Repeat __________ times. Complete this exercise __________ times a day. Single knee to chest  Lie on your back on a firm bed or the floor with both legs straight. Bend one of your knees. Use your hands to move your knee up toward your chest until you feel a gentle stretch in your lower back and buttock. Hold your leg in this position by holding on to the front of your knee. Keep your other leg as straight as possible. Hold this position for __________ seconds. Slowly return to the starting position. Repeat with your other leg. Repeat __________ times. Complete this exercise __________ times a day. Prone extension on elbows  Lie on your abdomen on a firm bed or the floor (prone position). Prop yourself up on your elbows. Use your arms  to help lift your chest up until you feel a gentle stretch in your abdomen and your lower back. This will place some of your body weight on your elbows. If this is uncomfortable, try stacking pillows under your chest. Your hips should stay down, against the surface that you are lying on. Keep your hip and back muscles relaxed. Hold this position for __________ seconds. Slowly relax your upper body and return to the starting position. Repeat __________ times. Complete this exercise __________ times a day. Strengthening exercises These exercises build strength and endurance in your back. Endurance is the ability to use your muscles for a long time, even after they get tired. Pelvic tilt This exercise strengthens the muscles that lie deep in the abdomen. Lie on your back on a firm bed or the floor with your legs extended. Bend your knees so they are pointing toward the ceiling and your feet are flat on the floor. Tighten your lower abdominal muscles to press your lower back against the floor. This motion will tilt your pelvis so your tailbone points up toward the ceiling instead of pointing to your feet or the floor. To help with this exercise, you may place a small towel under your lower back and try to push your back into the towel. Hold this position for __________ seconds. Let your muscles relax completely before you repeat this exercise. Repeat __________ times. Complete this exercise __________ times a day. Alternating arm and leg raises  Get on your hands  and knees on a firm surface. If you are on a hard floor, you may want to use padding, such as an exercise mat, to cushion your knees. Line up your arms and legs. Your hands should be directly below your shoulders, and your knees should be directly below your hips. Lift your left leg behind you. At the same time, raise your right arm and straighten it in front of you. Do not lift your leg higher than your hip. Do not lift your arm higher  than your shoulder. Keep your abdominal and back muscles tight. Keep your hips facing the ground. Do not arch your back. Keep your balance carefully, and do not hold your breath. Hold this position for __________ seconds. Slowly return to the starting position. Repeat with your right leg and your left arm. Repeat __________ times. Complete this exercise __________ times a day. Abdominal set with straight leg raise  Lie on your back on a firm bed or the floor. Bend one of your knees and keep your other leg straight. Tense your abdominal muscles and lift your straight leg up, 4-6 inches (10-15 cm) off the ground. Keep your abdominal muscles tight and hold this position for __________ seconds. Do not hold your breath. Do not arch your back. Keep it flat against the ground. Keep your abdominal muscles tense as you slowly lower your leg back to the starting position. Repeat with your other leg. Repeat __________ times. Complete this exercise __________ times a day. Single leg lower with bent knees Lie on your back on a firm bed or the floor. Tense your abdominal muscles and lift your feet off the floor, one foot at a time, so your knees and hips are bent in 90-degree angles (right angles). Your knees should be over your hips and your lower legs should be parallel to the floor. Keeping your abdominal muscles tense and your knee bent, slowly lower one of your legs so your toe touches the ground. Lift your leg back up to return to the starting position. Do not hold your breath. Do not let your back arch. Keep your back flat against the ground. Repeat with your other leg. Repeat __________ times. Complete this exercise __________ times a day. Posture and body mechanics Good posture and healthy body mechanics can help to relieve stress in your body's tissues and joints. Body mechanics refers to the movements and positions of your body while you do your daily activities. Posture is part of body  mechanics. Good posture means: Your spine is in its natural S-curve position (neutral). Your shoulders are pulled back slightly. Your head is not tipped forward (neutral). Follow these guidelines to improve your posture and body mechanics in your everyday activities. Standing  When standing, keep your spine neutral and your feet about hip-width apart. Keep a slight bend in your knees. Your ears, shoulders, and hips should line up. When you do a task in which you stand in one place for a long time, place one foot up on a stable object that is 2-4 inches (5-10 cm) high, such as a footstool. This helps keep your spine neutral. Sitting  When sitting, keep your spine neutral and keep your feet flat on the floor. Use a footrest, if necessary, and keep your thighs parallel to the floor. Avoid rounding your shoulders, and avoid tilting your head forward. When working at a desk or a computer, keep your desk at a height where your hands are slightly lower than your elbows. Slide your  chair under your desk so you are close enough to maintain good posture. When working at a computer, place your monitor at a height where you are looking straight ahead and you do not have to tilt your head forward or downward to look at the screen. Resting When lying down and resting, avoid positions that are most painful for you. If you have pain with activities such as sitting, bending, stooping, or squatting, lie in a position in which your body does not bend very much. For example, avoid curling up on your side with your arms and knees near your chest (fetal position). If you have pain with activities such as standing for a long time or reaching with your arms, lie with your spine in a neutral position and bend your knees slightly. Try the following positions: Lying on your side with a pillow between your knees. Lying on your back with a pillow under your knees. Lifting  When lifting objects, keep your feet at least  shoulder-width apart and tighten your abdominal muscles. Bend your knees and hips and keep your spine neutral. It is important to lift using the strength of your legs, not your back. Do not lock your knees straight out. Always ask for help to lift heavy or awkward objects. This information is not intended to replace advice given to you by your health care provider. Make sure you discuss any questions you have with your health care provider. Document Revised: 07/02/2022 Document Reviewed: 05/16/2020 Elsevier Patient Education  2024 [employer].

## 2023-02-02 ENCOUNTER — Telehealth: Payer: Medicaid Other | Admitting: Family Medicine

## 2023-02-02 DIAGNOSIS — M545 Low back pain, unspecified: Secondary | ICD-10-CM

## 2023-02-02 NOTE — Patient Instructions (Signed)
Lumbosacral Strain A lumbosacral strain is an injury that causes pain in the lower back (lumbosacral spine). This injury usually happens from overstretching the muscles or ligaments along the spine. Ligaments are cord-like tissues that connect bones to each other. A strain can affect one or more muscles or ligaments. What are the causes? This condition may be caused by: A hard, direct hit to the back. Overstretching the lower back muscles. This may result from: A fall. Lifting something heavy. Repeated movements such as bending or crouching. What increases the risk? The following factors make you more likely to have a lumbosacral strain: Taking part in sports or activities that involve: A sudden twist of the back. Pushing or pulling motions. Being overweight or obese. Having poor strength and flexibility, especially tight hamstrings or weak muscles in the back or abdomen. Having too much of a curve in the lower back. Having a pelvis that is tilted forward. What are the signs or symptoms? The main symptom of this condition is pain in the lower back, at the site of the strain. Pain may also be felt down one or both legs. How is this diagnosed? This condition is diagnosed based on: Your symptoms and medical history. A physical exam. During the exam: Your health care provider may push on certain areas of your back to find the source of your pain. You may be asked to bend forward, backward, and side to side to check your pain and range of motion. You may also have imaging tests, such as X-rays and an MRI. How is this treated? This condition may be treated by: Applying heat and cold to the affected area. Taking medicines for pain and to relax your muscles. Taking NSAIDs, such as ibuprofen, to help reduce swelling and discomfort. Doing stretching and strengthening exercises for your lower back. Symptoms usually improve within several weeks of treatment. But recovery time varies. When your  symptoms improve, gradually return to your normal routine as soon as possible. This will help reduce pain, avoid stiffness, and keep muscle strength. Follow these instructions at home: Medicines Take over-the-counter and prescription medicines only as told by your health care provider. Ask your health care provider if the medicine prescribed to you: Requires you to avoid driving or using machinery. Can cause constipation. You may need to take these actions to prevent or treat constipation: Drink enough fluid to keep your urine pale yellow. Take over-the-counter or prescription medicines. Eat foods that are high in fiber, such as beans, whole grains, and fresh fruits and vegetables. Limit foods that are high in fat and processed sugars, such as fried or sweet foods. Managing pain, stiffness, and swelling     If told, put ice on the injured area. Put ice in a plastic bag. Place a towel between your skin and the bag. Leave the ice on for 20 minutes, 2-3 times a day. If told, apply heat to the affected area as often as told by your health care provider. Use the heat source that your health care provider recommends, such as a moist heat pack or a heating pad. Place a towel between your skin and the heat source. Leave the heat on for 20-30 minutes. If your skin turns bright red, remove the heat or ice right away to prevent skin damage. The risk of damage is higher if you cannot feel pain, heat, or cold. Activity Rest as told by your health care provider. Do not stay in bed. Staying in bed for more than 1-2 days  can delay your recovery. Return to your normal activities as told by your health care provider. Ask your health care provider what activities are safe for you. Avoid activities that take a lot of energy for as long as told by your health care provider. Do exercises as told by your health care provider. This includes stretching and strengthening exercises. General instructions      Use good posture when sitting and standing. Avoid leaning forward when you sit, and avoid hunching over when you stand. Do not use any products that contain nicotine or tobacco. These products include cigarettes, chewing tobacco, and vaping devices, such as e-cigarettes. If you need help quitting, ask your health care provider. How is this prevented?  Warm up properly before physical activity, and cool down and stretch after being active. Use correct form when playing sports. Bend your knees and use correct posture when lifting heavy objects. Maintain a healthy weight. Sleep on a mattress with medium firmness to support your back. Do at least 150 minutes of moderate-intensity exercise each week, such as brisk walking or water aerobics. Try a form of exercise that takes stress off your back, such as swimming or stationary cycling. Maintain physical fitness, including: Strength. Flexibility. Contact a health care provider if: Your back pain does not improve after several weeks of treatment. Your symptoms get worse. You have a fever. Get help right away if: Your back pain is severe. You cannot stand or walk. You feel nauseous or you vomit. You develop any of the following: Trouble controlling when you urinate or when you have a bowel movement. Pain in your legs. Your feet or legs get very cold, turn pale, or look blue. Weakness in your buttocks or legs. This information is not intended to replace advice given to you by your health care provider. Make sure you discuss any questions you have with your health care provider. Document Revised: 07/02/2022 Document Reviewed: 09/19/2021 Elsevier Patient Education  2024 [employer].

## 2023-02-02 NOTE — Progress Notes (Signed)
Virtual Visit Consent   [name], you are scheduled for a virtual visit with a [facility] provider today. Just as with appointments in the office, your consent must be obtained to participate. Your consent will be active for this visit and any virtual visit you may have with one of our providers in the next 365 days. If you have a MyChart account, a copy of this consent can be sent to you electronically.  As this is a virtual visit, video technology does not allow for your provider to perform a traditional examination. This may limit your provider's ability to fully assess your condition. If your provider identifies any concerns that need to be evaluated in person or the need to arrange testing (such as labs, EKG, etc.), we will make arrangements to do so. Although advances in technology are sophisticated, we cannot ensure that it will always work on either your end or our end. If the connection with a video visit is poor, the visit may have to be switched to a telephone visit. With either a video or telephone visit, we are not always able to ensure that we have a secure connection.  By engaging in this virtual visit, you consent to the provision of healthcare and authorize for your insurance to be billed (if applicable) for the services provided during this visit. Depending on your insurance coverage, you may receive a charge related to this service.  I need to obtain your verbal consent now. Are you willing to proceed with your visit today? [name] has provided verbal consent on 02/02/2023 for a virtual visit (video or telephone). Georgana Curio, FNP  Date: 02/02/2023 9:10 AM  Virtual Visit via Video Note   I, Georgana Curio, connected with  [name]  ([id number], 1975-11-27) on 02/02/23 at  9:15 AM EST by a video-enabled telemedicine application and verified that I am speaking with the correct person using two identifiers.  Location: Patient: Virtual Visit Location Patient:  Home Provider: Virtual Visit Location Provider: Home Office   I discussed the limitations of evaluation and management by telemedicine and the availability of in person appointments. The patient expressed understanding and agreed to proceed.    History of Present Illness: [name] is a 47 y.o. who identifies as a male who was assigned male at birth, and is being seen today for lower back pain with abnormal MRI and has been referred to neurosurgery. He does not feel he can work again today and is off tomorrow and Monday. 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 Start taking after completion of starter pack., Disp: 60 tablet, Rfl: 5   apixaban (ELIQUIS) 5 MG TABS tablet, Take 1 tablet (5 mg total) by mouth 2 (two) times daily. Restart once post-operative bleeding has stopped (usually 2-3 days after surgery), Disp: 60 tablet, Rfl: 0   budesonide (PULMICORT) 0.5 MG/2ML nebulizer solution, Mix 4 mLs (1 mg total/2  respules) itno 240 ml of nasal saline irrigation once daily., Disp: 120 mL, Rfl: 11   carvedilol (COREG) 25 MG tablet, Take 1 tablet (25 mg total) by mouth 2 (two) times daily with a meal., Disp: 180 tablet, Rfl: 1   losartan (COZAAR) 100 MG tablet, Take 1 tablet (100 mg total) by mouth daily., Disp: 90 tablet, Rfl: 1   methimazole (TAPAZOLE) 10 MG tablet, Take 1 tablet (10 mg total) by mouth daily., Disp: 90 tablet, Rfl: 1   methocarbamol (ROBAXIN) 750 MG tablet, Take 1 tablet (750 mg total) by mouth 2 (two) times daily as needed for muscle spasms., Disp: 60 tablet, Rfl: 1   nystatin (MYCOSTATIN) 100000 UNIT/ML suspension, Take 5 mLs (500,000 Units total) by mouth 4 (four) times daily. Swish and swallow., Disp: 120 mL, Rfl: 0   sildenafil (VIAGRA) 100 MG tablet, Take 0.5-1 tablets (50-100 mg total) by mouth daily as needed for erectile dysfunction., Disp: 10 tablet, Rfl: 11   spironolactone (ALDACTONE) 50 MG tablet, Take 1 tablet (50 mg total) by mouth daily., Disp: 90 tablet, Rfl: 1  Observations/Objective: Patient is well-developed, well-nourished in no acute distress.  Resting comfortably  at home.  Head is normocephalic, atraumatic.  No labored breathing.  Speech is clear and coherent with logical content.  Patient is alert and oriented at baseline.    Assessment and Plan: 1. Acute low back pain, unspecified back pain laterality, unspecified whether sciatica present  Heat, meds as ordered, Urgent care as needed.   Follow Up Instructions: I discussed the assessment and treatment plan with the patient. The patient was provided an opportunity to ask questions and all were answered. The patient agreed with the plan and demonstrated an understanding of the instructions.  A copy of instructions were sent to the patient via MyChart unless otherwise noted below.     The patient was advised to call back or seek an in-person evaluation if the symptoms worsen or if the condition fails to  improve as anticipated.    Georgana Curio, FNP

## 2023-02-05 DIAGNOSIS — C300 Malignant neoplasm of nasal cavity: Secondary | ICD-10-CM | POA: Diagnosis not present

## 2023-02-05 DIAGNOSIS — J3489 Other specified disorders of nose and nasal sinuses: Secondary | ICD-10-CM | POA: Diagnosis not present

## 2023-02-05 DIAGNOSIS — C499 Malignant neoplasm of connective and soft tissue, unspecified: Secondary | ICD-10-CM | POA: Diagnosis not present

## 2023-02-05 DIAGNOSIS — C801 Malignant (primary) neoplasm, unspecified: Secondary | ICD-10-CM | POA: Diagnosis not present

## 2023-02-06 ENCOUNTER — Telehealth: Payer: Self-pay | Admitting: Internal Medicine

## 2023-02-06 NOTE — Telephone Encounter (Signed)
PC placed to pt's cell phone today.  VMB not set up yet.  Unable to leave message.

## 2023-02-08 ENCOUNTER — Other Ambulatory Visit: Payer: Self-pay

## 2023-02-11 ENCOUNTER — Telehealth: Payer: Self-pay | Admitting: Internal Medicine

## 2023-02-11 ENCOUNTER — Telehealth: Payer: Self-pay

## 2023-02-11 NOTE — Telephone Encounter (Signed)
Copied from CRM [id number]. Topic: General - Other >> Feb 11, 2023 10:31 AM Phill Myron wrote: Attn: Cassandra  Please call regarding the [employer], you recommended that I cancel. PLease call as soon as possible I start work at 11:30am

## 2023-02-11 NOTE — Telephone Encounter (Signed)
Call placed to patient unable to reach , unable to leave message on VM. VM not set up

## 2023-02-11 NOTE — Telephone Encounter (Signed)
Phone call placed to Mr. [name] today.  He had sent me a MyChart message last week.  Patient states he wanted me to know that he saw the oncologist Dr. Lin Givens at [facility] on 02/05/2023.  He recommended that chemotherapy is now the way to go.  Patient was upset by this because the ENT specialist Dr. Chestine Spore told him that for now he would only need surveillance.  Patient is not interested in doing chemotherapy.  Patient states that he got in contact with Dr. Chestine Spore after he was seen by Dr. Lin Givens and explained to her what he was told.  He states she told him that for now the plan is to continue surveillance.  He has a full body scan coming up 02/14/2023 to check to see if there has been any metastasis.  Patient states that he only wanted to keep me updated with everything.

## 2023-02-11 NOTE — Telephone Encounter (Signed)
Call placed to patient unable, unable  to leave message left on VM. VM not set up

## 2023-02-12 NOTE — Telephone Encounter (Signed)
Patient in the office today with a letter for [facility] that stated they denied claim for his recent surgery . Patient voiced that Jari Favre had sent a letter prior stated that the surgery was approved . Advised that he contact [facility] [employer] for guidance and they may send to medicated.

## 2023-02-13 ENCOUNTER — Other Ambulatory Visit: Payer: Self-pay

## 2023-02-14 DIAGNOSIS — E042 Nontoxic multinodular goiter: Secondary | ICD-10-CM | POA: Diagnosis not present

## 2023-02-14 DIAGNOSIS — J3489 Other specified disorders of nose and nasal sinuses: Secondary | ICD-10-CM | POA: Diagnosis not present

## 2023-02-14 DIAGNOSIS — C300 Malignant neoplasm of nasal cavity: Secondary | ICD-10-CM | POA: Diagnosis not present

## 2023-02-14 DIAGNOSIS — M461 Sacroiliitis, not elsewhere classified: Secondary | ICD-10-CM | POA: Diagnosis not present

## 2023-02-14 DIAGNOSIS — C499 Malignant neoplasm of connective and soft tissue, unspecified: Secondary | ICD-10-CM | POA: Diagnosis not present

## 2023-02-18 DIAGNOSIS — Z6827 Body mass index (BMI) 27.0-27.9, adult: Secondary | ICD-10-CM | POA: Diagnosis not present

## 2023-02-18 DIAGNOSIS — M51369 Other intervertebral disc degeneration, lumbar region without mention of lumbar back pain or lower extremity pain: Secondary | ICD-10-CM | POA: Diagnosis not present

## 2023-02-21 DIAGNOSIS — C300 Malignant neoplasm of nasal cavity: Secondary | ICD-10-CM | POA: Diagnosis not present

## 2023-02-21 DIAGNOSIS — Z9889 Other specified postprocedural states: Secondary | ICD-10-CM | POA: Diagnosis not present

## 2023-02-21 DIAGNOSIS — C801 Malignant (primary) neoplasm, unspecified: Secondary | ICD-10-CM | POA: Diagnosis not present

## 2023-02-25 ENCOUNTER — Other Ambulatory Visit: Payer: Self-pay

## 2023-02-26 ENCOUNTER — Other Ambulatory Visit: Payer: Self-pay

## 2023-03-01 ENCOUNTER — Telehealth: Payer: Medicaid Other

## 2023-03-01 ENCOUNTER — Telehealth: Payer: Medicaid Other | Admitting: Physician Assistant

## 2023-03-01 DIAGNOSIS — M51369 Other intervertebral disc degeneration, lumbar region without mention of lumbar back pain or lower extremity pain: Secondary | ICD-10-CM | POA: Diagnosis not present

## 2023-03-01 NOTE — Patient Instructions (Signed)
[name], thank you for joining Margaretann Loveless, PA-C for today's virtual visit.  While this provider is not your primary care provider (PCP), if your PCP is located in our provider database this encounter information will be shared with them immediately following your visit.   A [facility] MyChart account gives you access to today's visit and all your visits, tests, and labs performed at [facility] " click here if you don't have a [place] [facility] MyChart account or go to [email or web address]  Consent: (Patient) [name] provided verbal consent for this virtual visit at the beginning of the encounter.  Current Medications:  Current Outpatient Medications:    amLODipine (NORVASC) 10 MG tablet, Take 1 tablet (10 mg total) by mouth daily., Disp: 90 tablet, Rfl: 1   apixaban (ELIQUIS) 5 MG TABS tablet, Take 1 tablet (5 mg total) by mouth 2 (two) times daily. Start taking after completion of starter pack., Disp: 60 tablet, Rfl: 5   apixaban (ELIQUIS) 5 MG TABS tablet, Take 1 tablet (5 mg total) by mouth 2 (two) times daily. Restart once post-operative bleeding has stopped (usually 2-3 days after surgery), Disp: 60 tablet, Rfl: 0   budesonide (PULMICORT) 0.5 MG/2ML nebulizer solution, Mix 4 mLs (1 mg total/2 respules) itno 240 ml of nasal saline irrigation once daily., Disp: 120 mL, Rfl: 11   carvedilol (COREG) 25 MG tablet, Take 1 tablet (25 mg total) by mouth 2 (two) times daily with a meal., Disp: 180 tablet, Rfl: 1   losartan (COZAAR) 100 MG tablet, Take 1 tablet (100 mg total) by mouth daily., Disp: 90 tablet, Rfl: 1   methimazole (TAPAZOLE) 10 MG tablet, Take 1 tablet (10 mg total) by mouth daily., Disp: 90 tablet, Rfl: 1   methocarbamol (ROBAXIN) 750 MG tablet, Take 1 tablet (750 mg total) by mouth 2 (two) times daily as needed for muscle spasms., Disp: 60 tablet, Rfl: 1   sildenafil (VIAGRA) 100 MG tablet, Take 0.5-1 tablets (50-100 mg total) by mouth daily as  needed for erectile dysfunction., Disp: 10 tablet, Rfl: 11   spironolactone (ALDACTONE) 50 MG tablet, Take 1 tablet (50 mg total) by mouth daily., Disp: 90 tablet, Rfl: 1   Medications ordered in this encounter:  No orders of the defined types were placed in this encounter.    *If you need refills on other medications prior to your next appointment, please contact your pharmacy*  Follow-Up: Call back or seek an in-person evaluation if the symptoms worsen or if the condition fails to improve as anticipated.  [facility] [phone]  Other Instructions Back Exercises The following exercises strengthen the muscles that help to support the trunk (torso) and back. They also help to keep the lower back flexible. Doing these exercises can help to prevent or lessen existing low back pain. If you have back pain or discomfort, try doing these exercises 2-3 times each day or as told by your health care provider. As your pain improves, do them once each day, but increase the number of times that you repeat the steps for each exercise (do more repetitions). To prevent the recurrence of back pain, continue to do these exercises once each day or as told by your health care provider. Do exercises exactly as told by your health care provider and adjust them as directed. It is normal to feel mild stretching, pulling, tightness, or discomfort as you do these exercises, but you should stop right away if you feel sudden  pain or your pain gets worse. Exercises Single knee to chest Repeat these steps 3-5 times for each leg: Lie on your back on a firm bed or the floor with your legs extended. Bring one knee to your chest. Your other leg should stay extended and in contact with the floor. Hold your knee in place by grabbing your knee or thigh with both hands and hold. Pull on your knee until you feel a gentle stretch in your lower back or buttocks. Hold the stretch for 10-30 seconds. Slowly  release and straighten your leg.  Pelvic tilt Repeat these steps 5-10 times: Lie on your back on a firm bed or the floor with your legs extended. Bend your knees so they are pointing toward the ceiling and your feet are flat on the floor. Tighten your lower abdominal muscles to press your lower back against the floor. This motion will tilt your pelvis so your tailbone points up toward the ceiling instead of pointing to your feet or the floor. With gentle tension and even breathing, hold this position for 5-10 seconds.  Cat-cow Repeat these steps until your lower back becomes more flexible: Get into a hands-and-knees position on a firm bed or the floor. Keep your hands under your shoulders, and keep your knees under your hips. You may place padding under your knees for comfort. Let your head hang down toward your chest. Contract your abdominal muscles and point your tailbone toward the floor so your lower back becomes rounded like the back of a cat. Hold this position for 5 seconds. Slowly lift your head, let your abdominal muscles relax, and point your tailbone up toward the ceiling so your back forms a sagging arch like the back of a cow. Hold this position for 5 seconds.  Press-ups Repeat these steps 5-10 times: Lie on your abdomen (face-down) on a firm bed or the floor. Place your palms near your head, about shoulder-width apart. Keeping your back as relaxed as possible and keeping your hips on the floor, slowly straighten your arms to raise the top half of your body and lift your shoulders. Do not use your back muscles to raise your upper torso. You may adjust the placement of your hands to make yourself more comfortable. Hold this position for 5 seconds while you keep your back relaxed. Slowly return to lying flat on the floor.  Bridges Repeat these steps 10 times: Lie on your back on a firm bed or the floor. Bend your knees so they are pointing toward the ceiling and your feet are  flat on the floor. Your arms should be flat at your sides, next to your body. Tighten your buttocks muscles and lift your buttocks off the floor until your waist is at almost the same height as your knees. You should feel the muscles working in your buttocks and the back of your thighs. If you do not feel these muscles, slide your feet 1-2 inches (2.5-5 cm) farther away from your buttocks. Hold this position for 3-5 seconds. Slowly lower your hips to the starting position, and allow your buttocks muscles to relax completely. If this exercise is too easy, try doing it with your arms crossed over your chest. Abdominal crunches Repeat these steps 5-10 times: Lie on your back on a firm bed or the floor with your legs extended. Bend your knees so they are pointing toward the ceiling and your feet are flat on the floor. Cross your arms over your chest. Tip your chin  slightly toward your chest without bending your neck. Tighten your abdominal muscles and slowly raise your torso high enough to lift your shoulder blades a tiny bit off the floor. Avoid raising your torso higher than that because it can put too much stress on your lower back and does not help to strengthen your abdominal muscles. Slowly return to your starting position.  Back lifts Repeat these steps 5-10 times: Lie on your abdomen (face-down) with your arms at your sides, and rest your forehead on the floor. Tighten the muscles in your legs and your buttocks. Slowly lift your chest off the floor while you keep your hips pressed to the floor. Keep the back of your head in line with the curve in your back. Your eyes should be looking at the floor. Hold this position for 3-5 seconds. Slowly return to your starting position.  Contact a health care provider if: Your back pain or discomfort gets much worse when you do an exercise. Your worsening back pain or discomfort does not lessen within 2 hours after you exercise. If you have any of  these problems, stop doing these exercises right away. Do not do them again unless your health care provider says that you can. Get help right away if: You develop sudden, severe back pain. If this happens, stop doing the exercises right away. Do not do them again unless your health care provider says that you can. This information is not intended to replace advice given to you by your health care provider. Make sure you discuss any questions you have with your health care provider. Document Revised: 04/01/2022 Document Reviewed: 05/11/2020 Elsevier Patient Education  2024 [employer]    If you have been instructed to have an in-person evaluation today at a local Urgent Care facility, please use the link below. It will take you to a list of all of our available [facility], including address, phone number and hours of operation. Please do not delay care.  [facility]  If you or a family member do not have a primary care provider, use the link below to schedule a visit and establish care. When you choose a [facility] primary care physician or advanced practice provider, you gain a long-term partner in health. Find a Primary Care Provider  Learn more about [facility]'s in-office and virtual care options: [facility] - Get Care Now

## 2023-03-01 NOTE — Progress Notes (Signed)
Virtual Visit Consent   [name], you are scheduled for a virtual visit with a [facility] provider today. Just as with appointments in the office, your consent must be obtained to participate. Your consent will be active for this visit and any virtual visit you may have with one of our providers in the next 365 days. If you have a MyChart account, a copy of this consent can be sent to you electronically.  As this is a virtual visit, video technology does not allow for your provider to perform a traditional examination. This may limit your provider's ability to fully assess your condition. If your provider identifies any concerns that need to be evaluated in person or the need to arrange testing (such as labs, EKG, etc.), we will make arrangements to do so. Although advances in technology are sophisticated, we cannot ensure that it will always work on either your end or our end. If the connection with a video visit is poor, the visit may have to be switched to a telephone visit. With either a video or telephone visit, we are not always able to ensure that we have a secure connection.  By engaging in this virtual visit, you consent to the provision of healthcare and authorize for your insurance to be billed (if applicable) for the services provided during this visit. Depending on your insurance coverage, you may receive a charge related to this service.  I need to obtain your verbal consent now. Are you willing to proceed with your visit today? [name] has provided verbal consent on 03/01/2023 for a virtual visit (video or telephone). [name] Loveless, PA-C  Date: 03/01/2023 7:10 PM  Virtual Visit via Video Note   I, [name], connected with  [name]  ([id number], 05-Jan-1976) on 03/01/23 at  6:45 PM EST by a video-enabled telemedicine application and verified that I am speaking with the correct person using two identifiers.  Location: Patient: Virtual Visit  Location Patient: Home Provider: Virtual Visit Location Provider: Home Office   I discussed the limitations of evaluation and management by telemedicine and the availability of in person appointments. The patient expressed understanding and agreed to proceed.    History of Present Illness: [name] is a 47 y.o. who identifies as a male who was assigned male at birth, and is being seen today for back pain and spasms.  HPI: Back Pain This is a recurrent problem. Episode onset: Seen virtually 11/22 and 11/23 for similar issue. The problem occurs constantly. The problem is unchanged. The pain is present in the lumbar spine. The quality of the pain is described as shooting, stabbing and aching. The pain radiates to the right knee and right thigh. The pain is severe. The pain is The same all the time. The symptoms are aggravated by bending, sitting, standing and twisting. Stiffness is present All day. Associated symptoms include leg pain. Pertinent negatives include no bladder incontinence, bowel incontinence, fever, numbness, tingling or weakness. He has tried muscle relaxant, heat and bed rest for the symptoms. The treatment provided no relief.    Had MRI Lumbar spine on 01/24/23  CLINICAL DATA:  Low back pain.   EXAM: MRI LUMBAR SPINE WITHOUT CONTRAST   TECHNIQUE: Multiplanar, multisequence MR imaging of the lumbar spine was performed. No intravenous contrast was administered.   COMPARISON:  Lumbar spine x-rays dated April 26, 2022. MRI lumbar spine dated June 11, 2007.   FINDINGS: Segmentation:  Standard.   Alignment:  New trace retrolisthesis at L1-L2 and L2-L3. Unchanged trace retrolisthesis at L4-L5.   Vertebrae: No fracture, evidence of discitis, or bone lesion. New L2 superior endplate Schmorl's node with prominent surrounding marrow edema, which can be a source of pain.   Conus medullaris and cauda equina: Conus extends to the L1-L2 level. Conus and cauda equina  appear normal.   Paraspinal and other soft tissues: Negative.   Disc levels:   T12-L1:  Progressive mild disc bulging.  No stenosis.   L1-L2: Progressive mild disc bulging with superimposed small broad-based central disc protrusion. Progressive mild spinal canal stenosis. No neuroforaminal stenosis.   L2-L3: Similar minimal disc bulging eccentric to the left. No stenosis.   L3-L4: Progressive mild disc bulging with new superimposed left foraminal disc protrusion and annular fissure displacing the exiting left L3 nerve root. Mild bilateral facet arthropathy. Progressive mild spinal canal and bilateral neuroforaminal stenosis.   L4-L5: Progressive disc height loss and mild-to-moderate disc bulging. Superimposed central disc protrusion and annular fissure has decreased in size. Mild bilateral facet arthropathy. Improved now mild spinal canal and bilateral lateral recess stenosis. Unchanged mild bilateral neuroforaminal stenosis.   L5-S1: New right subarticular disc protrusion posteriorly displacing the descending right S1 nerve root. Increased right lateral recess stenosis. No spinal canal or neuroforaminal stenosis.   IMPRESSION: 1. New right subarticular disc protrusion at L5-S1 posteriorly displacing the descending right S1 nerve root. 2. Overall progressive multilevel lumbar spondylosis as described above. New left foraminal disc protrusion at L3-L4 displacing the exiting left L3 nerve root. 3. Decreased central disc protrusion at L4-L5 with improved now mild spinal canal and lateral recess stenosis. 4. New L2 superior endplate Schmorl's node with prominent surrounding marrow edema, which can be a source of pain.     Electronically Signed   By: Obie Dredge M.D.   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   Pulmonary embolism on right (HCC) 09/24/2022   Nasal cavity mass  09/24/2022   Acute deep vein thrombosis (DVT) of femoral vein of right lower extremity (HCC) 08/21/2022   Neck pain 08/14/2021   Prediabetes 01/28/2021   Vitamin D deficiency 01/28/2021   Pre-ulcerative corn or callous 02/01/2019   Disorder of left Achilles tendon 02/01/2019   Inguinal hernia of left side without obstruction or gangrene 12/08/2016   Former tobacco use 12/08/2016   Localized swelling, mass and lump, multiple sites 12/08/2016   Subcutaneous nodules 07/09/2016   Periodontitis 05/27/2015   Pain in left wrist 05/27/2015   HTN (hypertension) 05/12/2015    Allergies:   Medications:  Current Outpatient Medications:    amLODipine (NORVASC) 10 MG tablet, Take 1 tablet (10 mg total) by mouth daily., Disp: 90 tablet, Rfl: 1   apixaban (ELIQUIS) 5 MG TABS tablet, Take 1 tablet (5 mg total) by mouth 2 (two) times daily. Start taking after completion of starter pack., Disp: 60 tablet, Rfl: 5   apixaban (ELIQUIS) 5 MG TABS tablet, Take 1 tablet (5 mg total) by mouth 2 (two) times daily. Restart once post-operative bleeding has stopped (usually 2-3 days after surgery), Disp: 60 tablet, Rfl: 0   budesonide (PULMICORT) 0.5 MG/2ML nebulizer solution, Mix 4 mLs (1 mg total/2 respules) itno 240 ml of nasal saline irrigation once daily., Disp: 120 mL, Rfl: 11   carvedilol (COREG) 25 MG tablet, Take 1 tablet (25 mg total) by mouth 2 (two) times daily with a meal.,  Disp: 180 tablet, Rfl: 1   losartan (COZAAR) 100 MG tablet, Take 1 tablet (100 mg total) by mouth daily., Disp: 90 tablet, Rfl: 1   methimazole (TAPAZOLE) 10 MG tablet, Take 1 tablet (10 mg total) by mouth daily., Disp: 90 tablet, Rfl: 1   methocarbamol (ROBAXIN) 750 MG tablet, Take 1 tablet (750 mg total) by mouth 2 (two) times daily as needed for muscle spasms., Disp: 60 tablet, Rfl: 1   sildenafil (VIAGRA) 100 MG tablet, Take 0.5-1 tablets (50-100 mg total) by mouth daily as needed for erectile dysfunction., Disp: 10 tablet, Rfl:  11   spironolactone (ALDACTONE) 50 MG tablet, Take 1 tablet (50 mg total) by mouth daily., Disp: 90 tablet, Rfl: 1  Observations/Objective: Patient is well-developed, well-nourished in no acute distress.  Resting comfortably at home.  Head is normocephalic, atraumatic.  No labored breathing.  Speech is clear and coherent with logical content.  Patient is alert and oriented at baseline.    Assessment and Plan: 1. Bulging lumbar disc (Primary)  - Acute on Chronic back issue with lumbar bulging disc and radiculopathy - Cannot take NSAIDs due to being on Eliquis - Advised to take Celebrex and Methocarbamol which he has on hand - Tylenol if okay for breakthrough pain - Heat to area - Epsom salt soak if able to get in and out of bath tub safely - Back exercises and stretches provided via AVS - Seek in person evaluation if worsening or fails to improve with treatment   Follow Up Instructions: I discussed the assessment and treatment plan with the patient. The patient was provided an opportunity to ask questions and all were answered. The patient agreed with the plan and demonstrated an understanding of the instructions.  A copy of instructions were sent to the patient via MyChart unless otherwise noted below.    The patient was advised to call back or seek an in-person evaluation if the symptoms worsen or if the condition fails to improve as anticipated.    [name] Loveless, PA-C

## 2023-03-07 ENCOUNTER — Other Ambulatory Visit: Payer: Self-pay

## 2023-03-08 ENCOUNTER — Other Ambulatory Visit: Payer: Self-pay

## 2023-03-08 ENCOUNTER — Telehealth: Payer: Medicaid Other | Admitting: Family Medicine

## 2023-03-08 DIAGNOSIS — M5442 Lumbago with sciatica, left side: Secondary | ICD-10-CM

## 2023-03-08 DIAGNOSIS — G8929 Other chronic pain: Secondary | ICD-10-CM

## 2023-03-08 DIAGNOSIS — M5441 Lumbago with sciatica, right side: Secondary | ICD-10-CM | POA: Diagnosis not present

## 2023-03-08 DIAGNOSIS — C119 Malignant neoplasm of nasopharynx, unspecified: Secondary | ICD-10-CM

## 2023-03-08 MED ORDER — METHOCARBAMOL 750 MG PO TABS
750.0000 mg | ORAL_TABLET | Freq: Two times a day (BID) | ORAL | 1 refills | Status: DC | PRN
Start: 2023-03-08 — End: 2023-06-07
  Filled 2023-03-08: qty 60, 30d supply, fill #0
  Filled 2023-05-19: qty 60, 30d supply, fill #1

## 2023-03-08 NOTE — Patient Instructions (Signed)
Acute Back Pain, Adult Acute back pain is sudden and usually short-lived. It is often caused by an injury to the muscles and tissues in the back. The injury may result from: A muscle, tendon, or ligament getting overstretched or torn. Ligaments are tissues that connect bones to each other. Lifting something improperly can cause a back strain. Wear and tear (degeneration) of the spinal disks. Spinal disks are circular tissue that provide cushioning between the bones of the spine (vertebrae). Twisting motions, such as while playing sports or doing yard work. A hit to the back. Arthritis. You may have a physical exam, lab tests, and imaging tests to find the cause of your pain. Acute back pain usually goes away with rest and home care. Follow these instructions at home: Managing pain, stiffness, and swelling Take over-the-counter and prescription medicines only as told by your health care provider. Treatment may include medicines for pain and inflammation that are taken by mouth or applied to the skin, or muscle relaxants. Your health care provider may recommend applying ice during the first 24-48 hours after your pain starts. To do this: Put ice in a plastic bag. Place a towel between your skin and the bag. Leave the ice on for 20 minutes, 2-3 times a day. Remove the ice if your skin turns bright red. This is very important. If you cannot feel pain, heat, or cold, you have a greater risk of damage to the area. If directed, apply heat to the affected area as often as told by your health care provider. Use the heat source that your health care provider recommends, such as a moist heat pack or a heating pad. Place a towel between your skin and the heat source. Leave the heat on for 20-30 minutes. Remove the heat if your skin turns bright red. This is especially important if you are unable to feel pain, heat, or cold. You have a greater risk of getting burned. Activity  Do not stay in bed. Staying in  bed for more than 1-2 days can delay your recovery. Sit up and stand up straight. Avoid leaning forward when you sit or hunching over when you stand. If you work at a desk, sit close to it so you do not need to lean over. Keep your chin tucked in. Keep your neck drawn back, and keep your elbows bent at a 90-degree angle (right angle). Sit high and close to the steering wheel when you drive. Add lower back (lumbar) support to your car seat, if needed. Take short walks on even surfaces as soon as you are able. Try to increase the length of time you walk each day. Do not sit, drive, or stand in one place for more than 30 minutes at a time. Sitting or standing for long periods of time can put stress on your back. Do not drive or use heavy machinery while taking prescription pain medicine. Use proper lifting techniques. When you bend and lift, use positions that put less stress on your back: Bend your knees. Keep the load close to your body. Avoid twisting. Exercise regularly as told by your health care provider. Exercising helps your back heal faster and helps prevent back injuries by keeping muscles strong and flexible. Work with a physical therapist to make a safe exercise program, as recommended by your health care provider. Do any exercises as told by your physical therapist. Lifestyle Maintain a healthy weight. Extra weight puts stress on your back and makes it difficult to have good   posture. Avoid activities or situations that make you feel anxious or stressed. Stress and anxiety increase muscle tension and can make back pain worse. Learn ways to manage anxiety and stress, such as through exercise. General instructions Sleep on a firm mattress in a comfortable position. Try lying on your side with your knees slightly bent. If you lie on your back, put a pillow under your knees. Keep your head and neck in a straight line with your spine (neutral position) when using electronic equipment like  smartphones or pads. To do this: Raise your smartphone or pad to look at it instead of bending your head or neck to look down. Put the smartphone or pad at the level of your face while looking at the screen. Follow your treatment plan as told by your health care provider. This may include: Cognitive or behavioral therapy. Acupuncture or massage therapy. Meditation or yoga. Contact a health care provider if: You have pain that is not relieved with rest or medicine. You have increasing pain going down into your legs or buttocks. Your pain does not improve after 2 weeks. You have pain at night. You lose weight without trying. You have a fever or chills. You develop nausea or vomiting. You develop abdominal pain. Get help right away if: You develop new bowel or bladder control problems. You have unusual weakness or numbness in your arms or legs. You feel faint. These symptoms may represent a serious problem that is an emergency. Do not wait to see if the symptoms will go away. Get medical help right away. Call your local emergency services (911 in the [place] Do not drive yourself to the hospital. Summary Acute back pain is sudden and usually short-lived. Use proper lifting techniques. When you bend and lift, use positions that put less stress on your back. Take over-the-counter and prescription medicines only as told by your health care provider, and apply heat or ice as told. This information is not intended to replace advice given to you by your health care provider. Make sure you discuss any questions you have with your health care provider. Document Revised: 05/20/2020 Document Reviewed: 05/20/2020 Elsevier Patient Education  2024 [employer]  

## 2023-03-08 NOTE — Progress Notes (Signed)
Virtual Visit Consent   [name], you are scheduled for a virtual visit with a [facility] provider today. Just as with appointments in the office, your consent must be obtained to participate. Your consent will be active for this visit and any virtual visit you may have with one of our providers in the next 365 days. If you have a MyChart account, a copy of this consent can be sent to you electronically.  As this is a virtual visit, video technology does not allow for your provider to perform a traditional examination. This may limit your provider's ability to fully assess your condition. If your provider identifies any concerns that need to be evaluated in person or the need to arrange testing (such as labs, EKG, etc.), we will make arrangements to do so. Although advances in technology are sophisticated, we cannot ensure that it will always work on either your end or our end. If the connection with a video visit is poor, the visit may have to be switched to a telephone visit. With either a video or telephone visit, we are not always able to ensure that we have a secure connection.  By engaging in this virtual visit, you consent to the provision of healthcare and authorize for your insurance to be billed (if applicable) for the services provided during this visit. Depending on your insurance coverage, you may receive a charge related to this service.  I need to obtain your verbal consent now. Are you willing to proceed with your visit today? [name] has provided verbal consent on 03/08/2023 for a virtual visit (video or telephone). Georgana Curio, FNP  Date: 03/08/2023 5:04 PM  Virtual Visit via Video Note   I, Georgana Curio, connected with  [name]  ([id number], 47) on 03/08/23 at  5:00 PM EST by a video-enabled telemedicine application and verified that I am speaking with the correct person using two identifiers.  Location: Patient: Virtual Visit Location Patient:  Home Provider: Virtual Visit Location Provider: Home Office   I discussed the limitations of evaluation and management by telemedicine and the availability of in person appointments. The patient expressed understanding and agreed to proceed.    History of Present Illness: [name] is a 47 y.o. who identifies as a male who was assigned male at birth, and is being seen today for discussion of nasal cancer removed in September. Healing well. Neg body scan and they want him to have chemo but he is hesitant. He has back pain off and on and is seeing neurosurgery however has requested a second opinion. He needs a refill on robaxin. He has not been able to work today and needs a note.   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   Acute deep vein thrombosis (DVT) of femoral vein of right lower extremity (HCC) 08/21/2022   Neck pain 08/14/2021   Prediabetes 01/28/2021   Vitamin D deficiency 01/28/2021   Pre-ulcerative corn or callous 02/01/2019   Disorder of left Achilles tendon 02/01/2019   Inguinal hernia of left side without obstruction or gangrene 12/08/2016   Former tobacco use 12/08/2016   Localized swelling, mass and lump, multiple sites 12/08/2016   Subcutaneous nodules 07/09/2016   Periodontitis 05/27/2015   Pain in left wrist 05/27/2015   HTN (hypertension) 05/12/2015    Allergies:  Medications:  Current Outpatient Medications:    amLODipine (NORVASC) 10 MG tablet, Take 1 tablet (10 mg total) by mouth daily., Disp: 90 tablet, Rfl: 1   apixaban (ELIQUIS) 5 MG TABS tablet, Take 1 tablet (5 mg total) by mouth 2 (two) times daily. Start taking after completion of starter pack., Disp: 60 tablet, Rfl: 5   apixaban (ELIQUIS) 5 MG TABS tablet, Take 1 tablet (5 mg total) by mouth 2 (two) times daily. Restart once post-operative  bleeding has stopped (usually 2-3 days after surgery), Disp: 60 tablet, Rfl: 0   budesonide (PULMICORT) 0.5 MG/2ML nebulizer solution, Mix 4 mLs (1 mg total/2 respules) itno 240 ml of nasal saline irrigation once daily., Disp: 120 mL, Rfl: 11   carvedilol (COREG) 25 MG tablet, Take 1 tablet (25 mg total) by mouth 2 (two) times daily with a meal., Disp: 180 tablet, Rfl: 1   losartan (COZAAR) 100 MG tablet, Take 1 tablet (100 mg total) by mouth daily., Disp: 90 tablet, Rfl: 1   methimazole (TAPAZOLE) 10 MG tablet, Take 1 tablet (10 mg total) by mouth daily., Disp: 90 tablet, Rfl: 1   methocarbamol (ROBAXIN) 750 MG tablet, Take 1 tablet (750 mg total) by mouth 2 (two) times daily as needed for muscle spasms., Disp: 60 tablet, Rfl: 1   sildenafil (VIAGRA) 100 MG tablet, Take 0.5-1 tablets (50-100 mg total) by mouth daily as needed for erectile dysfunction., Disp: 10 tablet, Rfl: 11   spironolactone (ALDACTONE) 50 MG tablet, Take 1 tablet (50 mg total) by mouth daily., Disp: 90 tablet, Rfl: 1  Observations/Objective: Patient is well-developed, well-nourished in no acute distress.  Resting comfortably  at home.  Head is normocephalic, atraumatic.  No labored breathing.  Speech is clear and coherent with logical content.  Patient is alert and oriented at baseline.    Assessment and Plan: 1. Acute low back pain, unspecified back pain laterality, unspecified whether sciatica present (Primary)  2. Nasopharyngeal carcinoma (HCC)  Continue nasal rinses per oncology, healing well, continue heat to back. Discussed at length he need to have FMLA completed by pcp to cover the days he is missing for his back and not use telehealth for noted oow as we will not be able to supply anymore. He will call them Monday.   Follow Up Instructions: I discussed the assessment and treatment plan with the patient. The patient was provided an opportunity to ask questions and all were answered. The patient agreed with the  plan and demonstrated an understanding of the instructions.  A copy of instructions were sent to the patient via MyChart unless otherwise noted below.     The patient was advised to call back or seek an in-person evaluation if the symptoms worsen or if the condition fails to improve as anticipated.    Georgana Curio, FNP

## 2023-03-11 ENCOUNTER — Other Ambulatory Visit: Payer: Self-pay

## 2023-03-12 ENCOUNTER — Ambulatory Visit: Payer: Medicaid Other

## 2023-03-17 ENCOUNTER — Other Ambulatory Visit: Payer: Self-pay

## 2023-03-17 ENCOUNTER — Emergency Department (HOSPITAL_COMMUNITY)
Admission: EM | Admit: 2023-03-17 | Discharge: 2023-03-17 | Disposition: A | Discharge status: Home / Self Care | Payer: Medicaid Other | Attending: Emergency Medicine | Admitting: Emergency Medicine

## 2023-03-17 ENCOUNTER — Encounter (HOSPITAL_COMMUNITY): Payer: Self-pay

## 2023-03-17 ENCOUNTER — Emergency Department (HOSPITAL_COMMUNITY): Payer: Medicaid Other

## 2023-03-17 ENCOUNTER — Telehealth: Payer: Medicaid Other | Admitting: Physician Assistant

## 2023-03-17 DIAGNOSIS — R109 Unspecified abdominal pain: Secondary | ICD-10-CM

## 2023-03-17 DIAGNOSIS — Z7901 Long term (current) use of anticoagulants: Secondary | ICD-10-CM | POA: Diagnosis not present

## 2023-03-17 DIAGNOSIS — R1031 Right lower quadrant pain: Secondary | ICD-10-CM | POA: Diagnosis not present

## 2023-03-17 DIAGNOSIS — R197 Diarrhea, unspecified: Secondary | ICD-10-CM | POA: Diagnosis not present

## 2023-03-17 DIAGNOSIS — Z79899 Other long term (current) drug therapy: Secondary | ICD-10-CM | POA: Insufficient documentation

## 2023-03-17 DIAGNOSIS — R630 Anorexia: Secondary | ICD-10-CM | POA: Insufficient documentation

## 2023-03-17 DIAGNOSIS — R110 Nausea: Secondary | ICD-10-CM | POA: Insufficient documentation

## 2023-03-17 LAB — URINALYSIS, ROUTINE W REFLEX MICROSCOPIC
Bacteria, UA: NONE SEEN
Bilirubin Urine: NEGATIVE
Glucose, UA: NEGATIVE mg/dL
Hgb urine dipstick: NEGATIVE
Ketones, ur: NEGATIVE mg/dL
Leukocytes,Ua: NEGATIVE
Nitrite: NEGATIVE
Protein, ur: 30 mg/dL — AB
Specific Gravity, Urine: 1.019 (ref 1.005–1.030)
pH: 7 (ref 5.0–8.0)

## 2023-03-17 LAB — CBC
HCT: 42.6 % (ref 39.0–52.0)
Hemoglobin: 14.9 g/dL (ref 13.0–17.0)
MCH: 28.9 pg (ref 26.0–34.0)
MCHC: 35 g/dL (ref 30.0–36.0)
MCV: 82.7 fL (ref 80.0–100.0)
Platelets: 339 10*3/uL (ref 150–400)
RBC: 5.15 MIL/uL (ref 4.22–5.81)
RDW: 12.7 % (ref 11.5–15.5)
WBC: 8.7 10*3/uL (ref 4.0–10.5)
nRBC: 0 % (ref 0.0–0.2)

## 2023-03-17 LAB — COMPREHENSIVE METABOLIC PANEL
ALT: 18 U/L (ref 0–44)
AST: 14 U/L — ABNORMAL LOW (ref 15–41)
Albumin: 3.4 g/dL — ABNORMAL LOW (ref 3.5–5.0)
Alkaline Phosphatase: 96 U/L (ref 38–126)
Anion gap: 9 (ref 5–15)
BUN: 9 mg/dL (ref 6–20)
CO2: 23 mmol/L (ref 22–32)
Calcium: 9.3 mg/dL (ref 8.9–10.3)
Chloride: 100 mmol/L (ref 98–111)
Creatinine, Ser: 0.78 mg/dL (ref 0.61–1.24)
GFR, Estimated: >60 mL/min (ref >60)
Glucose, Bld: 159 mg/dL — ABNORMAL HIGH (ref 70–99)
Potassium: 3.7 mmol/L (ref 3.5–5.1)
Sodium: 132 mmol/L — ABNORMAL LOW (ref 135–145)
Total Bilirubin: 0.6 mg/dL (ref 0.0–1.2)
Total Protein: 8.9 g/dL — ABNORMAL HIGH (ref 6.5–8.1)

## 2023-03-17 LAB — LIPASE, BLOOD: Lipase: 29 U/L (ref 11–51)

## 2023-03-17 MED ORDER — SODIUM CHLORIDE 0.9 % IV BOLUS
1000.0000 mL | Freq: Once | INTRAVENOUS | Status: AC
  Administered 2023-03-17: 1000 mL via INTRAVENOUS

## 2023-03-17 MED ORDER — IOHEXOL 300 MG/ML  SOLN
100.0000 mL | Freq: Once | INTRAMUSCULAR | Status: AC | PRN
  Administered 2023-03-17: 100 mL via INTRAVENOUS

## 2023-03-17 MED ORDER — HYDROCODONE-ACETAMINOPHEN 5-325 MG PO TABS
1.0000 | ORAL_TABLET | Freq: Four times a day (QID) | ORAL | 0 refills | Status: DC | PRN
  Filled 2023-03-17: qty 10, 3d supply, fill #0

## 2023-03-17 MED ORDER — HYDROMORPHONE HCL 1 MG/ML IJ SOLN
1.0000 mg | Freq: Once | INTRAMUSCULAR | Status: AC
  Administered 2023-03-17: 1 mg via INTRAVENOUS
  Filled 2023-03-17: qty 1

## 2023-03-17 MED ORDER — ONDANSETRON 4 MG PO TBDP
4.0000 mg | ORAL_TABLET | Freq: Once | ORAL | Status: AC | PRN
  Administered 2023-03-17: 4 mg via ORAL
  Filled 2023-03-17: qty 1

## 2023-03-17 NOTE — Discharge Instructions (Signed)
 As discussed, your evaluation today has been largely reassuring.  But, it is important that you monitor your condition carefully, and do not hesitate to return to the ED if you develop new, or concerning changes in your condition. ? ?Otherwise, please follow-up with your physician for appropriate ongoing care. ? ?

## 2023-03-17 NOTE — Progress Notes (Deleted)
 Virtual Visit Consent   [name], you are scheduled for a virtual visit with a [facility] provider today. Just as with appointments in the office, your consent must be obtained to participate. Your consent will be active for this visit and any virtual visit you may have with one of our providers in the next 365 days. If you have a MyChart account, a copy of this consent can be sent to you electronically.  As this is a virtual visit, video technology does not allow for your provider to perform a traditional examination. This may limit your provider's ability to fully assess your condition. If your provider identifies any concerns that need to be evaluated in person or the need to arrange testing (such as labs, EKG, etc.), we will make arrangements to do so. Although advances in technology are sophisticated, we cannot ensure that it will always work on either your end or our end. If the connection with a video visit is poor, the visit may have to be switched to a telephone visit. With either a video or telephone visit, we are not always able to ensure that we have a secure connection.  By engaging in this virtual visit, you consent to the provision of healthcare and authorize for your insurance to be billed (if applicable) for the services provided during this visit. Depending on your insurance coverage, you may receive a charge related to this service.  I need to obtain your verbal consent now. Are you willing to proceed with your visit today? [name] has provided verbal consent on 03/17/2023 for a virtual visit (video or telephone). [name], [place]  Date: 03/17/2023 5:54 PM  Virtual Visit via Video Note   I, [name], connected with  [name]  ([id number], 07/17/1975) on 03/17/23 at  5:45 PM EST by a video-enabled telemedicine application and verified that I am speaking with the correct person using two identifiers.  Location: Patient: Virtual Visit Location Patient:  Home Provider: Virtual Visit Location Provider: Home Office   I discussed the limitations of evaluation and management by telemedicine and the availability of in person appointments. The patient expressed understanding and agreed to proceed.    History of Present Illness: [name] is a 48 y.o. who identifies as a male who was assigned male at birth, and is being seen today for lower abdominal pain. SABRA  HPI: Abdominal Pain The current episode started today. The onset quality is sudden. The problem occurs constantly. The problem has been unchanged. The pain is located in the RLQ. The pain is at a severity of 10/10. The quality of the pain is aching and sharp. The abdominal pain radiates to the right flank. Associated symptoms include nausea. Pertinent negatives include no anorexia, dysuria or vomiting. The pain is aggravated by bowel movement and urination. He has tried nothing for the symptoms. The treatment provided no relief. His past medical history is significant for abdominal surgery.    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 Developed Bell's Palsy the day after taking antibiotic.    Penicillins Swelling and Rash    Has patient had a PCN reaction causing immediate rash, facial/tongue/throat swelling, SOB or lightheadedness with hypotension: {Yes/No:30480221}Yes Has patient had a PCN reaction causing severe rash involving mucus membranes or skin necrosis: {Yes/No:30480221}No Has patient had a PCN reaction that required hospitalization {Yes/No:30480221}Yes Has patient had a PCN reaction occurring within the last 10 years: {Yes/No:30480221}No If all of the above answers are NO, then may proceed with Cephalospori   Medications:  Current Outpatient Medications:    amLODipine  (NORVASC ) 10 MG tablet, Take 1 tablet (10 mg total) by mouth daily., Disp: 90 tablet, Rfl: 1   apixaban  (ELIQUIS ) 5 MG TABS tablet, Take 1 tablet (5 mg total) by mouth 2 (two) times daily. Start taking after completion of starter pack., Disp: 60 tablet, Rfl: 5   apixaban  (ELIQUIS ) 5 MG TABS tablet, Take 1 tablet (5 mg total) by mouth 2 (two) times daily. Restart once post-operative bleeding has stopped (usually 2-3 days after surgery), Disp: 60 tablet, Rfl: 0   budesonide  (PULMICORT ) 0.5 MG/2ML nebulizer solution, Mix 4 mLs (1 mg total/2 respules) itno 240 ml of nasal saline irrigation once daily., Disp: 120 mL, Rfl: 11   carvedilol  (COREG ) 25 MG tablet, Take 1 tablet (25 mg total) by mouth 2 (two) times daily with a meal., Disp: 180 tablet, Rfl: 1   losartan  (COZAAR ) 100 MG tablet, Take 1 tablet (100 mg total) by mouth daily., Disp: 90 tablet, Rfl: 1   methimazole  (TAPAZOLE ) 10 MG tablet, Take 1 tablet (10 mg total) by mouth daily., Disp: 90 tablet, Rfl: 1   methocarbamol  (ROBAXIN ) 750 MG tablet, Take 1 tablet (750 mg total) by mouth 2 (two) times daily as needed for muscle spasms., Disp: 60 tablet, Rfl: 1   sildenafil  (VIAGRA ) 100 MG tablet, Take 0.5-1 tablets (50-100 mg total) by mouth daily as needed for erectile dysfunction., Disp: 10 tablet, Rfl: 11   spironolactone  (ALDACTONE ) 50 MG tablet, Take 1  tablet (50 mg total) by mouth daily., Disp: 90 tablet, Rfl: 1  Observations/Objective: Patient is well-developed, well-nourished in no acute distress.  Resting comfortably  at home.  Head is normocephalic, atraumatic.  No labored breathing.  Speech is clear and coherent with logical content.  Patient is alert and oriented at baseline.    Assessment and Plan: 1. Right lower quadrant pain (Primary)  2. Right flank pain  Concern for appendicitis vs pyelonephritis vs nephro/ureterolithiasis. Patient to report to ER emergently for evaluation. Nontoxic or ill appearance on exam. No nausea or vomiting. No fevers or chills. Patient is in agreement with plan, ,  Follow Up Instructions: I discussed the assessment and treatment plan with the patient. The patient was provided an opportunity to ask questions and all were answered. The patient agreed with the plan and demonstrated an understanding of the instructions.  A copy of instructions were sent to the patient via MyChart unless otherwise noted below.    The patient was advised to call back or seek an in-person evaluation if the symptoms worsen or if the condition fails to improve as anticipated.    [name], PA-C

## 2023-03-17 NOTE — ED Triage Notes (Signed)
 Right sided abdominal pain that started this AM with nausea. No vomiting. 2 episodes of diarrhea. Pt states he had a cholecystectomy 2 yrs ago.

## 2023-03-17 NOTE — Progress Notes (Signed)
 Virtual Visit Consent   [name], you are scheduled for a virtual visit with a [facility] provider today. Just as with appointments in the office, your consent must be obtained to participate. Your consent will be active for this visit and any virtual visit you may have with one of our providers in the next 365 days. If you have a MyChart account, a copy of this consent can be sent to you electronically.  As this is a virtual visit, video technology does not allow for your provider to perform a traditional examination. This may limit your provider's ability to fully assess your condition. If your provider identifies any concerns that need to be evaluated in person or the need to arrange testing (such as labs, EKG, etc.), we will make arrangements to do so. Although advances in technology are sophisticated, we cannot ensure that it will always work on either your end or our end. If the connection with a video visit is poor, the visit may have to be switched to a telephone visit. With either a video or telephone visit, we are not always able to ensure that we have a secure connection.  By engaging in this virtual visit, you consent to the provision of healthcare and authorize for your insurance to be billed (if applicable) for the services provided during this visit. Depending on your insurance coverage, you may receive a charge related to this service.  I need to obtain your verbal consent now. Are you willing to proceed with your visit today? [name] has provided verbal consent on 03/17/2023 for a virtual visit (video or telephone). [name], [place]  Date: 03/17/2023 5:58 PM  Virtual Visit via Video Note   I, [name], connected with  [name]  ([id number], 1975/12/07) on 03/17/23 at  5:45 PM EST by a video-enabled telemedicine application and verified that I am speaking with the correct person using two identifiers.  Location: Patient: Virtual Visit Location Patient:  Home Provider: Virtual Visit Location Provider: Home Office   I discussed the limitations of evaluation and management by telemedicine and the availability of in person appointments. The patient expressed understanding and agreed to proceed.    History of Present Illness: [name] is a 48 y.o. who identifies as a male who was assigned male at birth, and is being seen today for abdominal pain.  HPI:    Abdominal Pain This is a new problem. The current episode started today. The onset quality is sudden. The problem occurs constantly. The problem has been unchanged. The pain is located in the RLQ. The pain is at a severity of 9/10. The pain is severe. The quality of the pain is sharp. The abdominal pain radiates to the right flank. Associated symptoms include anorexia and nausea. Pertinent negatives include no arthralgias, belching, constipation, diarrhea, fever, flatus, frequency, headaches, hematochezia, melena, myalgias, vomiting or weight loss. The pain is aggravated by urination and bowel movement. The pain is relieved by Nothing. He has tried nothing for the symptoms. His past medical history is significant for abdominal surgery.    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   Acute deep vein thrombosis (DVT) of femoral vein of right lower extremity (HCC) 08/21/2022   Neck pain 08/14/2021   Prediabetes 01/28/2021   Vitamin D  deficiency 01/28/2021   Pre-ulcerative corn or callous  02/01/2019   Disorder of left Achilles tendon 02/01/2019   Inguinal hernia of left side without obstruction or gangrene 12/08/2016   Former tobacco use 12/08/2016   Localized swelling, mass and lump, multiple sites 12/08/2016   Subcutaneous nodules 07/09/2016   Periodontitis 05/27/2015   Pain in left wrist 05/27/2015   HTN (hypertension) 05/12/2015     Allergies:   Medications:  Current Outpatient Medications:    amLODipine  (NORVASC ) 10 MG tablet, Take 1 tablet (10 mg total) by mouth daily., Disp: 90 tablet, Rfl: 1   apixaban  (ELIQUIS ) 5 MG TABS tablet, Take 1 tablet (5 mg total) by mouth 2 (two) times daily. Start taking after completion of starter pack., Disp: 60 tablet, Rfl: 5   apixaban  (ELIQUIS ) 5 MG TABS tablet, Take 1 tablet (5 mg total) by mouth 2 (two) times daily. Restart once post-operative bleeding has stopped (usually 2-3 days after surgery), Disp: 60 tablet, Rfl: 0   budesonide  (PULMICORT ) 0.5 MG/2ML nebulizer solution, Mix 4 mLs (1 mg total/2 respules) itno 240 ml of nasal saline irrigation once daily., Disp: 120 mL, Rfl: 11   carvedilol  (COREG ) 25 MG tablet, Take 1 tablet (25 mg total) by mouth 2 (two) times daily with a meal., Disp: 180 tablet, Rfl: 1   losartan  (COZAAR ) 100 MG tablet, Take 1 tablet (100 mg total) by mouth daily., Disp: 90 tablet, Rfl: 1   methimazole  (TAPAZOLE ) 10 MG tablet, Take 1 tablet (10 mg total) by mouth daily., Disp: 90 tablet, Rfl: 1   methocarbamol  (ROBAXIN ) 750 MG tablet, Take 1 tablet (750 mg total) by mouth 2 (two) times daily as needed for muscle spasms., Disp: 60 tablet, Rfl: 1   sildenafil  (VIAGRA ) 100 MG tablet, Take 0.5-1 tablets (50-100 mg total) by mouth daily as needed for erectile dysfunction., Disp: 10 tablet, Rfl: 11   spironolactone  (ALDACTONE ) 50 MG tablet, Take 1 tablet (50 mg total) by mouth daily., Disp: 90 tablet, Rfl: 1  Observations/Objective: Patient is well-developed, well-nourished in no acute distress.  Resting comfortably  at home.  Head is normocephalic, atraumatic.  No labored breathing.  Speech is clear and coherent with logical content.  Patient is alert and oriented at baseline.   Assessment and Plan: 1. Right lower quadrant pain (Primary)  2. Right flank pain   Concern for appendicitis vs pyelonephritis vs nephro/ureterolithiasis. Patient to report to ER  emergently for evaluation. Nontoxic or ill appearance on exam. No nausea or vomiting. No fevers or chills. Patient is in agreement with plan  Follow Up Instructions: I discussed the assessment and treatment plan with the patient. The patient was provided an opportunity to ask questions and all were answered. The patient agreed with the plan and demonstrated an understanding of the instructions.  A copy of instructions were sent to the patient via MyChart unless otherwise noted below.   The patient was advised to call back or seek an in-person evaluation if the symptoms worsen or if the condition fails to improve as anticipated.    [name] Shuck, PA-C

## 2023-03-17 NOTE — ED Provider Notes (Signed)
 [place] EMERGENCY DEPARTMENT AT [facility] Provider Note   CSN: [id number] Arrival date & time: 03/17/23  1840     History  Chief Complaint  Patient presents with   Abdominal Pain    [name] is a 48 y.o. male.  HPI Patient presents with concern of right inguinal and lower abdominal pain.  There are associated nausea, anorexia, but no vomiting.  He has had at least 1 episode of loose stool since onset.  No urinary complaints, no scrotal complaints. History is notable for cholecystectomy 2 years ago, as well as ongoing evaluation for malignancy. There is a history of PE/DVT as well.  Patient reportedly taking his Eliquis .    Home Medications Prior to Admission medications   Medication Sig Start Date End Date Taking? Authorizing Provider  acetaminophen  (TYLENOL ) 500 MG tablet Take 500 mg by mouth every 6 (six) hours as needed for moderate pain (pain score 4-6).   Yes [provider]  amLODipine  (NORVASC ) 10 MG tablet Take 1 tablet (10 mg total) by mouth daily. 01/09/23  Yes Vicci Barnie NOVAK, MD  apixaban  (ELIQUIS ) 5 MG TABS tablet Take 1 tablet (5 mg total) by mouth 2 (two) times daily. Start taking after completion of starter pack. 08/21/22  Yes Serene Gaile ORN, MD  budesonide  (PULMICORT ) 0.5 MG/2ML nebulizer solution Mix 4 mLs (1 mg total/2 respules) itno 240 ml of nasal saline irrigation once daily. 01/09/23  Yes   carvedilol  (COREG ) 25 MG tablet Take 1 tablet (25 mg total) by mouth 2 (two) times daily with a meal. 01/09/23  Yes Vicci Barnie NOVAK, MD  HYDROcodone -acetaminophen  (NORCO/VICODIN) 5-325 MG tablet Take 1 tablet by mouth every 6 (six) hours as needed. 03/17/23  Yes Garrick Charleston, MD  losartan  (COZAAR ) 100 MG tablet Take 1 tablet (100 mg total) by mouth daily. 01/09/23  Yes Vicci Barnie NOVAK, MD  methocarbamol  (ROBAXIN ) 750 MG tablet Take 1 tablet (750 mg total) by mouth 2 (two) times daily as needed for muscle spasms. 03/08/23  Yes Blair,  Diane W, FNP  sildenafil  (VIAGRA ) 100 MG tablet Take 0.5-1 tablets (50-100 mg total) by mouth daily as needed for erectile dysfunction. 04/20/22  Yes Fleming, Zelda W, NP  spironolactone  (ALDACTONE ) 50 MG tablet Take 1 tablet (50 mg total) by mouth daily. 01/09/23  Yes Vicci Barnie NOVAK, MD  apixaban  (ELIQUIS ) 5 MG TABS tablet Take 1 tablet (5 mg total) by mouth 2 (two) times daily. Restart once post-operative bleeding has stopped (usually 2-3 days after surgery) Patient not taking: Reported on 03/17/2023 11/21/22     methimazole  (TAPAZOLE ) 10 MG tablet Take 1 tablet (10 mg total) by mouth daily. Patient not taking: Reported on 03/17/2023 01/09/23   Vicci Barnie NOVAK, MD      Allergies    Doxycycline  and Penicillins    Review of Systems   Review of Systems  Physical Exam Updated Vital Signs BP 131/77 (BP Location: Left Arm)   Pulse 61   Temp 98.3 F (36.8 C) (Oral)   Resp 18   Ht 6' 2 (1.88 m)   Wt 97.1 kg   SpO2 98%   BMI 27.48 kg/m  Physical Exam Vitals and nursing note reviewed.  Constitutional:      General: He is not in acute distress.    Appearance: He is well-developed.  HENT:     Head: Normocephalic and atraumatic.  Eyes:     Conjunctiva/sclera: Conjunctivae normal.  Cardiovascular:     Rate and Rhythm:  Normal rate and regular rhythm.  Pulmonary:     Effort: Pulmonary effort is normal. No respiratory distress.     Breath sounds: No stridor.  Abdominal:     General: There is no distension.     Tenderness: There is abdominal tenderness in the right lower quadrant.  Skin:    General: Skin is warm and dry.  Neurological:     Mental Status: He is alert and oriented to person, place, and time.     ED Results / Procedures / Treatments   Labs (all labs ordered are listed, but only abnormal results are displayed) Labs Reviewed  COMPREHENSIVE METABOLIC PANEL - Abnormal; Notable for the following components:      Result Value   Sodium 132 (*)    Glucose, Bld 159 (*)     Total Protein 8.9 (*)    Albumin 3.4 (*)    AST 14 (*)    All other components within normal limits  URINALYSIS, ROUTINE W REFLEX MICROSCOPIC - Abnormal; Notable for the following components:   Protein, ur 30 (*)    All other components within normal limits  LIPASE, BLOOD  CBC    EKG None  Radiology CT ABDOMEN PELVIS W CONTRAST Result Date: 03/17/2023 CLINICAL DATA:  Right lower quadrant pain EXAM: CT ABDOMEN AND PELVIS WITH CONTRAST TECHNIQUE: Multidetector CT imaging of the abdomen and pelvis was performed using the standard protocol following bolus administration of intravenous contrast. RADIATION DOSE REDUCTION: This exam was performed according to the departmental dose-optimization program which includes automated exposure control, adjustment of the mA and/or kV according to patient size and/or use of iterative reconstruction technique. CONTRAST:  OMNIPAQUE  IOHEXOL  300 MG/ML  SOLN COMPARISON:  09/24/2016 FINDINGS: Lower chest: Minimal dependent atelectasis.  No effusions. Hepatobiliary: Prior cholecystectomy. Mild intrahepatic and extrahepatic biliary ductal dilatation, stable since prior study compatible with post cholecystectomy state. No focal hepatic abnormality. Pancreas: No focal abnormality or ductal dilatation. Spleen: No focal abnormality.  Normal size. Adrenals/Urinary Tract: 2 cm left upper pole renal cyst. No follow-up imaging recommended. Adrenal glands normal. No stones or hydronephrosis. Urinary bladder unremarkable. Stomach/Bowel: Normal appendix. Few scattered left colonic diverticula. No active diverticulitis. Stomach and small bowel decompressed, unremarkable. Vascular/Lymphatic: No evidence of aneurysm or adenopathy. Reproductive: No visible focal abnormality. Other: No free fluid or free air. Small left inguinal hernia containing fat, stable. Musculoskeletal: No acute bony abnormality. IMPRESSION: Normal appendix. No acute findings in the abdomen or pelvis.  Electronically Signed   By: Franky Crease M.D.   On: 03/17/2023 22:21    Procedures Procedures    Medications Ordered in ED Medications  ondansetron  (ZOFRAN -ODT) disintegrating tablet 4 mg (4 mg Oral Given 03/17/23 1850)  sodium chloride  0.9 % bolus 1,000 mL (0 mLs Intravenous Stopped 03/17/23 2250)  HYDROmorphone  (DILAUDID ) injection 1 mg (1 mg Intravenous Given 03/17/23 2131)  iohexol  (OMNIPAQUE ) 300 MG/ML solution 100 mL (100 mLs Intravenous Contrast Given 03/17/23 2200)    ED Course/ Medical Decision Making/ A&P                                 Medical Decision Making Adult male with history of prior abdominal surgery now presents with right lower quadrant abdominal pain, nausea, anorexia.  Differential includes all intra-abdominal processes including appendicitis, kidney stone, less likely hernia with reassuring physical exam in that regard.  Patient has no urinary complaints. Cardiac 80 sinus normal line pulse ox  100% room air normal   Amount and/or Complexity of Data Reviewed Independent Historian: spouse External Data Reviewed: notes.    Details: Outpatient MRI results reviewed Labs: ordered. Decision-making details documented in ED Course. Radiology: ordered and independent interpretation performed. Decision-making details documented in ED Course.  Risk Prescription drug management. Decision regarding hospitalization. Diagnosis or treatment significantly limited by social determinants of health.   11:13 PM Patient calm, comfortable, in no distress.  I reviewed the CT, labs the patient and his wife.  No evidence for appendicitis or other acute findings. No evidence for bacteremia, sepsis, infection. Patient comfortable with close outpatient follow-up, return precautions.        Final Clinical Impression(s) / ED Diagnoses Final diagnoses:  Right lower quadrant abdominal pain    Rx / DC Orders ED Discharge Orders          Ordered    HYDROcodone -acetaminophen   (NORCO/VICODIN) 5-325 MG tablet  Every 6 hours PRN        03/17/23 2313              Garrick Charleston, MD 03/17/23 2313

## 2023-03-17 NOTE — Patient Instructions (Signed)
  [name] Single, thank you for joining [name], PA-C for today's virtual visit.  While this provider is not your primary care provider (PCP), if your PCP is located in our provider database this encounter information will be shared with them immediately following your visit.   A [facility] MyChart account gives you access to today's visit and all your visits, tests, and labs performed at [facility]  click here if you don't have a [facility] MyChart account or go to [email or web address]  Consent: (Patient) [name] Single provided verbal consent for this virtual visit at the beginning of the encounter.  Current Medications:  Current Outpatient Medications:    amLODipine  (NORVASC ) 10 MG tablet, Take 1 tablet (10 mg total) by mouth daily., Disp: 90 tablet, Rfl: 1   apixaban  (ELIQUIS ) 5 MG TABS tablet, Take 1 tablet (5 mg total) by mouth 2 (two) times daily. Start taking after completion of starter pack., Disp: 60 tablet, Rfl: 5   apixaban  (ELIQUIS ) 5 MG TABS tablet, Take 1 tablet (5 mg total) by mouth 2 (two) times daily. Restart once post-operative bleeding has stopped (usually 2-3 days after surgery), Disp: 60 tablet, Rfl: 0   budesonide  (PULMICORT ) 0.5 MG/2ML nebulizer solution, Mix 4 mLs (1 mg total/2 respules) itno 240 ml of nasal saline irrigation once daily., Disp: 120 mL, Rfl: 11   carvedilol  (COREG ) 25 MG tablet, Take 1 tablet (25 mg total) by mouth 2 (two) times daily with a meal., Disp: 180 tablet, Rfl: 1   losartan  (COZAAR ) 100 MG tablet, Take 1 tablet (100 mg total) by mouth daily., Disp: 90 tablet, Rfl: 1   methimazole  (TAPAZOLE ) 10 MG tablet, Take 1 tablet (10 mg total) by mouth daily., Disp: 90 tablet, Rfl: 1   methocarbamol  (ROBAXIN ) 750 MG tablet, Take 1 tablet (750 mg total) by mouth 2 (two) times daily as needed for muscle spasms., Disp: 60 tablet, Rfl: 1   sildenafil  (VIAGRA ) 100 MG tablet, Take 0.5-1 tablets (50-100 mg total) by mouth daily as needed  for erectile dysfunction., Disp: 10 tablet, Rfl: 11   spironolactone  (ALDACTONE ) 50 MG tablet, Take 1 tablet (50 mg total) by mouth daily., Disp: 90 tablet, Rfl: 1   Medications ordered in this encounter:  No orders of the defined types were placed in this encounter.    *If you need refills on other medications prior to your next appointment, please contact your pharmacy*  Follow-Up: Call back or seek an in-person evaluation if the symptoms worsen or if the condition fails to improve as anticipated.  [facility] [phone]  Other Instructions Report to ER for evaluation.    If you have been instructed to have an in-person evaluation today at a local Urgent Care facility, please use the link below. It will take you to a list of all of our available [facility], including address, phone number and hours of operation. Please do not delay care.  [facility]  If you or a family member do not have a primary care provider, use the link below to schedule a visit and establish care. When you choose a [facility] primary care physician or advanced practice provider, you gain a long-term partner in health. Find a Primary Care Provider  Learn more about [facility]'s in-office and virtual care options:  - Get Care Now

## 2023-03-17 NOTE — ED Notes (Signed)
 Pt transport to CT

## 2023-03-18 ENCOUNTER — Other Ambulatory Visit: Payer: Self-pay

## 2023-03-18 ENCOUNTER — Encounter: Payer: Self-pay | Admitting: Internal Medicine

## 2023-03-19 ENCOUNTER — Telehealth: Payer: Medicaid Other | Admitting: Physician Assistant

## 2023-03-19 DIAGNOSIS — R109 Unspecified abdominal pain: Secondary | ICD-10-CM | POA: Diagnosis not present

## 2023-03-19 DIAGNOSIS — M25551 Pain in right hip: Secondary | ICD-10-CM | POA: Diagnosis not present

## 2023-03-19 NOTE — Patient Instructions (Signed)
 [name], thank you for joining [name] CHRISTELLA Dickinson, PA-C for today's virtual visit.  While this provider is not your primary care provider (PCP), if your PCP is located in our provider database this encounter information will be shared with them immediately following your visit.   A [facility] MyChart account gives you access to today's visit and all your visits, tests, and labs performed at [facility]  click here if you don't have a [facility] MyChart account or go to [email or web address]  Consent: (Patient) [name] provided verbal consent for this virtual visit at the beginning of the encounter.  Current Medications:  Current Outpatient Medications:    acetaminophen  (TYLENOL ) 500 MG tablet, Take 500 mg by mouth every 6 (six) hours as needed for moderate pain (pain score 4-6)., Disp: , Rfl:    amLODipine  (NORVASC ) 10 MG tablet, Take 1 tablet (10 mg total) by mouth daily., Disp: 90 tablet, Rfl: 1   apixaban  (ELIQUIS ) 5 MG TABS tablet, Take 1 tablet (5 mg total) by mouth 2 (two) times daily. Start taking after completion of starter pack., Disp: 60 tablet, Rfl: 5   apixaban  (ELIQUIS ) 5 MG TABS tablet, Take 1 tablet (5 mg total) by mouth 2 (two) times daily. Restart once post-operative bleeding has stopped (usually 2-3 days after surgery) (Patient not taking: Reported on 03/17/2023), Disp: 60 tablet, Rfl: 0   budesonide  (PULMICORT ) 0.5 MG/2ML nebulizer solution, Mix 4 mLs (1 mg total/2 respules) itno 240 ml of nasal saline irrigation once daily., Disp: 120 mL, Rfl: 11   carvedilol  (COREG ) 25 MG tablet, Take 1 tablet (25 mg total) by mouth 2 (two) times daily with a meal., Disp: 180 tablet, Rfl: 1   HYDROcodone -acetaminophen  (NORCO/VICODIN) 5-325 MG tablet, Take 1 tablet by mouth every 6 (six) hours as needed., Disp: 10 tablet, Rfl: 0   losartan  (COZAAR ) 100 MG tablet, Take 1 tablet (100 mg total) by mouth daily., Disp: 90 tablet, Rfl: 1   methimazole  (TAPAZOLE ) 10  MG tablet, Take 1 tablet (10 mg total) by mouth daily. (Patient not taking: Reported on 03/17/2023), Disp: 90 tablet, Rfl: 1   methocarbamol  (ROBAXIN ) 750 MG tablet, Take 1 tablet (750 mg total) by mouth 2 (two) times daily as needed for muscle spasms., Disp: 60 tablet, Rfl: 1   sildenafil  (VIAGRA ) 100 MG tablet, Take 0.5-1 tablets (50-100 mg total) by mouth daily as needed for erectile dysfunction., Disp: 10 tablet, Rfl: 11   spironolactone  (ALDACTONE ) 50 MG tablet, Take 1 tablet (50 mg total) by mouth daily., Disp: 90 tablet, Rfl: 1   Medications ordered in this encounter:  No orders of the defined types were placed in this encounter.    *If you need refills on other medications prior to your next appointment, please contact your pharmacy*  Follow-Up: Call back or seek an in-person evaluation if the symptoms worsen or if the condition fails to improve as anticipated.  [facility] [phone]  Other Instructions Back Exercises The following exercises strengthen the muscles that help to support the trunk (torso) and back. They also help to keep the lower back flexible. Doing these exercises can help to prevent or lessen existing low back pain. If you have back pain or discomfort, try doing these exercises 2-3 times each day or as told by your health care provider. As your pain improves, do them once each day, but increase the number of times that you repeat the steps for each exercise (do more repetitions). To  prevent the recurrence of back pain, continue to do these exercises once each day or as told by your health care provider. Do exercises exactly as told by your health care provider and adjust them as directed. It is normal to feel mild stretching, pulling, tightness, or discomfort as you do these exercises, but you should stop right away if you feel sudden pain or your pain gets worse. Exercises [name] knee to chest Repeat these steps 3-5 times for each leg: Lie on your  back on a firm bed or the floor with your legs extended. Bring one knee to your chest. Your other leg should stay extended and in contact with the floor. Hold your knee in place by grabbing your knee or thigh with both hands and hold. Pull on your knee until you feel a gentle stretch in your lower back or buttocks. Hold the stretch for 10-30 seconds. Slowly release and straighten your leg.  Pelvic tilt Repeat these steps 5-10 times: Lie on your back on a firm bed or the floor with your legs extended. Bend your knees so they are pointing toward the ceiling and your feet are flat on the floor. Tighten your lower abdominal muscles to press your lower back against the floor. This motion will tilt your pelvis so your tailbone points up toward the ceiling instead of pointing to your feet or the floor. With gentle tension and even breathing, hold this position for 5-10 seconds.  Cat-cow Repeat these steps until your lower back becomes more flexible: Get into a hands-and-knees position on a firm bed or the floor. Keep your hands under your shoulders, and keep your knees under your hips. You may place padding under your knees for comfort. Let your head hang down toward your chest. Contract your abdominal muscles and point your tailbone toward the floor so your lower back becomes rounded like the back of a cat. Hold this position for 5 seconds. Slowly lift your head, let your abdominal muscles relax, and point your tailbone up toward the ceiling so your back forms a sagging arch like the back of a cow. Hold this position for 5 seconds.  Press-ups Repeat these steps 5-10 times: Lie on your abdomen (face-down) on a firm bed or the floor. Place your palms near your head, about shoulder-width apart. Keeping your back as relaxed as possible and keeping your hips on the floor, slowly straighten your arms to raise the top half of your body and lift your shoulders. Do not use your back muscles to raise your  upper torso. You may adjust the placement of your hands to make yourself more comfortable. Hold this position for 5 seconds while you keep your back relaxed. Slowly return to lying flat on the floor.  Bridges Repeat these steps 10 times: Lie on your back on a firm bed or the floor. Bend your knees so they are pointing toward the ceiling and your feet are flat on the floor. Your arms should be flat at your sides, next to your body. Tighten your buttocks muscles and lift your buttocks off the floor until your waist is at almost the same height as your knees. You should feel the muscles working in your buttocks and the back of your thighs. If you do not feel these muscles, slide your feet 1-2 inches (2.5-5 cm) farther away from your buttocks. Hold this position for 3-5 seconds. Slowly lower your hips to the starting position, and allow your buttocks muscles to relax completely. If this  exercise is too easy, try doing it with your arms crossed over your chest. Abdominal crunches Repeat these steps 5-10 times: Lie on your back on a firm bed or the floor with your legs extended. Bend your knees so they are pointing toward the ceiling and your feet are flat on the floor. Cross your arms over your chest. Tip your chin slightly toward your chest without bending your neck. Tighten your abdominal muscles and slowly raise your torso high enough to lift your shoulder blades a tiny bit off the floor. Avoid raising your torso higher than that because it can put too much stress on your lower back and does not help to strengthen your abdominal muscles. Slowly return to your starting position.  Back lifts Repeat these steps 5-10 times: Lie on your abdomen (face-down) with your arms at your sides, and rest your forehead on the floor. Tighten the muscles in your legs and your buttocks. Slowly lift your chest off the floor while you keep your hips pressed to the floor. Keep the back of your head in line with the  curve in your back. Your eyes should be looking at the floor. Hold this position for 3-5 seconds. Slowly return to your starting position.  Contact a health care provider if: Your back pain or discomfort gets much worse when you do an exercise. Your worsening back pain or discomfort does not lessen within 2 hours after you exercise. If you have any of these problems, stop doing these exercises right away. Do not do them again unless your health care provider says that you can. Get help right away if: You develop sudden, severe back pain. If this happens, stop doing the exercises right away. Do not do them again unless your health care provider says that you can. This information is not intended to replace advice given to you by your health care provider. Make sure you discuss any questions you have with your health care provider. Document Revised: 04/01/2022 Document Reviewed: 05/11/2020 Elsevier Patient Education  2024 Elsevier Inc.    If you have been instructed to have an in-person evaluation today at a local Urgent Care facility, please use the link below. It will take you to a list of all of our available [facility], including address, phone number and hours of operation. Please do not delay care.  [facility]  If you or a family member do not have a primary care provider, use the link below to schedule a visit and establish care. When you choose a [facility] primary care physician or advanced practice provider, you gain a long-term partner in health. Find a Primary Care Provider  Learn more about [facility]'s in-office and virtual care options: [facility] - Get Care Now

## 2023-03-19 NOTE — Progress Notes (Signed)
 Virtual Visit Consent   [name], you are scheduled for a virtual visit with a [facility] provider today. Just as with appointments in the office, your consent must be obtained to participate. Your consent will be active for this visit and any virtual visit you may have with one of our providers in the next 365 days. If you have a MyChart account, a copy of this consent can be sent to you electronically.  As this is a virtual visit, video technology does not allow for your provider to perform a traditional examination. This may limit your provider's ability to fully assess your condition. If your provider identifies any concerns that need to be evaluated in person or the need to arrange testing (such as labs, EKG, etc.), we will make arrangements to do so. Although advances in technology are sophisticated, we cannot ensure that it will always work on either your end or our end. If the connection with a video visit is poor, the visit may have to be switched to a telephone visit. With either a video or telephone visit, we are not always able to ensure that we have a secure connection.  By engaging in this virtual visit, you consent to the provision of healthcare and authorize for your insurance to be billed (if applicable) for the services provided during this visit. Depending on your insurance coverage, you may receive a charge related to this service.  I need to obtain your verbal consent now. Are you willing to proceed with your visit today? [name] has provided verbal consent on 03/19/2023 for a virtual visit (video or telephone). [name] CHRISTELLA Dickinson, PA-C  Date: 03/19/2023 11:14 AM  Virtual Visit via Video Note   I, [name], connected with  [name]  ([id number], 49-Nov-1977) on 03/19/23 at 10:15 AM EST by a video-enabled telemedicine application and verified that I am speaking with the correct person using two identifiers.  Location: Patient: Virtual Visit Location  Patient: Home Provider: Virtual Visit Location Provider: Home Office   I discussed the limitations of evaluation and management by telemedicine and the availability of in person appointments. The patient expressed understanding and agreed to proceed.    History of Present Illness: [name] is a 48 y.o. who identifies as a male who was assigned male at birth, and is being seen today for continued right sided pain. Patient was seen in ER on Sunday, 03/17/23 due to same. At that time at thought may have been appendix and reported more RLQ pain. Had CT that was unremarkable. Labs overall unremarkable, glucose mildly elevated and protein mildly elevated with patient reporting not had eaten for hours prior to arrival. UA had mild proteinuria as well.  Reports pain still persist and feels the pain is in the right side and reports radiates to the right hip. He had been prescribed Norco from ER but has not picked up yet. He has been using Tylenol  500mg  without relief.   He has messaged his PCP and his awaiting a follow up appt.   He does have an appt with his Oncologist on 03/26/23.  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   Acute deep vein thrombosis (DVT) of femoral vein of right lower extremity (HCC)  08/21/2022   Neck pain 08/14/2021   Prediabetes 01/28/2021   Vitamin D  deficiency 01/28/2021   Pre-ulcerative corn or callous 02/01/2019   Disorder of left Achilles tendon 02/01/2019   Inguinal hernia of left side without obstruction or gangrene 12/08/2016   Former tobacco use 12/08/2016   Localized swelling, mass and lump, multiple sites 12/08/2016   Subcutaneous nodules 07/09/2016   Periodontitis 05/27/2015   Pain in left wrist  05/27/2015   HTN (hypertension) 05/12/2015    Allergies:  Allergies  Allergen Reactions   Doxycycline  Other (See Comments)    Possible allergen. Developed Bell's Palsy the day after taking antibiotic.   Penicillins Swelling and Rash   Medications:  Current Outpatient Medications:    acetaminophen  (TYLENOL ) 500 MG tablet, Take 500 mg by mouth every 6 (six) hours as needed for moderate pain (pain score 4-6)., Disp: , Rfl:    amLODipine  (NORVASC ) 10 MG tablet, Take 1 tablet (10 mg total) by mouth daily., Disp: 90 tablet, Rfl: 1   apixaban  (ELIQUIS ) 5 MG TABS tablet, Take 1 tablet (5 mg total) by mouth 2 (two) times daily. Start taking after completion of starter pack., Disp: 60 tablet, Rfl: 5   apixaban  (ELIQUIS ) 5 MG TABS tablet, Take 1 tablet (5 mg total) by mouth 2 (two) times daily. Restart once post-operative bleeding has stopped (usually 2-3 days after surgery) (Patient not taking: Reported on 03/17/2023), Disp: 60 tablet, Rfl: 0   budesonide  (PULMICORT ) 0.5 MG/2ML nebulizer solution, Mix 4 mLs (1 mg total/2 respules) itno 240 ml of nasal saline irrigation once daily., Disp: 120 mL, Rfl: 11   carvedilol  (COREG ) 25 MG tablet, Take 1 tablet (25 mg total) by mouth 2 (two) times daily with a meal., Disp: 180 tablet, Rfl: 1   HYDROcodone -acetaminophen  (NORCO/VICODIN) 5-325 MG tablet, Take 1 tablet by mouth every 6 (six) hours as needed., Disp: 10 tablet, Rfl: 0   losartan  (COZAAR ) 100 MG tablet, Take 1 tablet (100 mg total) by mouth daily., Disp: 90 tablet, Rfl: 1   methimazole  (TAPAZOLE ) 10 MG tablet, Take 1 tablet (10 mg total) by mouth daily. (Patient not taking: Reported on 03/17/2023), Disp: 90 tablet, Rfl: 1   methocarbamol  (ROBAXIN ) 750 MG tablet, Take 1 tablet (750 mg total) by mouth 2 (two) times daily as needed for muscle spasms., Disp: 60 tablet, Rfl: 1   sildenafil  (VIAGRA ) 100 MG tablet, Take 0.5-1 tablets (50-100 mg total) by mouth daily as needed for erectile dysfunction., Disp: 10  tablet, Rfl: 11   spironolactone  (ALDACTONE ) 50 MG tablet, Take 1 tablet (50 mg total) by mouth daily., Disp: 90 tablet, Rfl: 1  Observations/Objective: Patient is well-developed, well-nourished in no acute distress.  Resting comfortably at home.  Head is normocephalic, atraumatic.  No labored breathing.  Speech is clear and coherent with logical content.  Patient is alert and oriented at baseline.    Assessment and Plan: 1. Right sided abdominal pain (Primary)  2. Right hip pain  - CT abdomen overall normal, no right sided abnormality - Advised patient to pick up Norco, take 1 every 6 hours - Can alternate tylenol  500mg  with Norco and advised he could take 4 extra tylenol  in a 24 hour period with Norco - Heating pad - Work note provided - Advised to follow up with his PCP, ENT, or Oncologist for FMLA paperwork due to frequent absences  - Advised to discuss this back pain and hip pain with Oncologist due to possible aggressive sarcoma diagnosis; he voiced understanding  Follow Up Instructions: I discussed the assessment and treatment plan with the patient. The patient was provided an opportunity to ask questions and all were answered. The patient agreed with the plan and demonstrated an understanding of the instructions.  A copy of instructions were sent to the patient via MyChart unless otherwise noted below.    The patient was advised to call back or seek an in-person evaluation if the symptoms worsen or if the condition fails to improve as anticipated.    [name] CHRISTELLA Dickinson, PA-C

## 2023-03-26 ENCOUNTER — Inpatient Hospital Stay: Payer: Medicaid Other | Admitting: Internal Medicine

## 2023-03-26 DIAGNOSIS — Z08 Encounter for follow-up examination after completed treatment for malignant neoplasm: Secondary | ICD-10-CM | POA: Diagnosis not present

## 2023-03-26 DIAGNOSIS — Z85831 Personal history of malignant neoplasm of soft tissue: Secondary | ICD-10-CM | POA: Diagnosis not present

## 2023-03-26 DIAGNOSIS — C801 Malignant (primary) neoplasm, unspecified: Secondary | ICD-10-CM | POA: Diagnosis not present

## 2023-03-26 DIAGNOSIS — J3489 Other specified disorders of nose and nasal sinuses: Secondary | ICD-10-CM | POA: Diagnosis not present

## 2023-03-26 DIAGNOSIS — C499 Malignant neoplasm of connective and soft tissue, unspecified: Secondary | ICD-10-CM | POA: Diagnosis not present

## 2023-03-28 ENCOUNTER — Ambulatory Visit: Payer: Medicaid Other | Admitting: Physical Therapy

## 2023-03-29 ENCOUNTER — Emergency Department (HOSPITAL_BASED_OUTPATIENT_CLINIC_OR_DEPARTMENT_OTHER): Payer: Medicaid Other

## 2023-03-29 ENCOUNTER — Other Ambulatory Visit: Payer: Self-pay

## 2023-03-29 ENCOUNTER — Encounter (HOSPITAL_BASED_OUTPATIENT_CLINIC_OR_DEPARTMENT_OTHER): Payer: Self-pay | Admitting: Emergency Medicine

## 2023-03-29 ENCOUNTER — Emergency Department (HOSPITAL_BASED_OUTPATIENT_CLINIC_OR_DEPARTMENT_OTHER)
Admission: EM | Admit: 2023-03-29 | Discharge: 2023-03-29 | Disposition: A | Discharge status: Home / Self Care | Payer: Medicaid Other | Attending: Emergency Medicine | Admitting: Emergency Medicine

## 2023-03-29 DIAGNOSIS — J351 Hypertrophy of tonsils: Secondary | ICD-10-CM | POA: Diagnosis not present

## 2023-03-29 DIAGNOSIS — J029 Acute pharyngitis, unspecified: Secondary | ICD-10-CM

## 2023-03-29 DIAGNOSIS — E049 Nontoxic goiter, unspecified: Secondary | ICD-10-CM | POA: Diagnosis not present

## 2023-03-29 DIAGNOSIS — J039 Acute tonsillitis, unspecified: Secondary | ICD-10-CM | POA: Insufficient documentation

## 2023-03-29 DIAGNOSIS — Z7901 Long term (current) use of anticoagulants: Secondary | ICD-10-CM | POA: Diagnosis not present

## 2023-03-29 DIAGNOSIS — R070 Pain in throat: Secondary | ICD-10-CM | POA: Diagnosis not present

## 2023-03-29 DIAGNOSIS — H9202 Otalgia, left ear: Secondary | ICD-10-CM | POA: Diagnosis not present

## 2023-03-29 LAB — CBC WITH DIFFERENTIAL/PLATELET
Abs Immature Granulocytes: 0.02 10*3/uL (ref 0.00–0.07)
Basophils Absolute: 0.1 10*3/uL (ref 0.0–0.1)
Basophils Relative: 1 %
Eosinophils Absolute: 0.1 10*3/uL (ref 0.0–0.5)
Eosinophils Relative: 2 %
HCT: 45.6 % (ref 39.0–52.0)
Hemoglobin: 15.3 g/dL (ref 13.0–17.0)
Immature Granulocytes: 0 %
Lymphocytes Relative: 43 %
Lymphs Abs: 3.2 10*3/uL (ref 0.7–4.0)
MCH: 28.2 pg (ref 26.0–34.0)
MCHC: 33.6 g/dL (ref 30.0–36.0)
MCV: 84 fL (ref 80.0–100.0)
Monocytes Absolute: 0.4 10*3/uL (ref 0.1–1.0)
Monocytes Relative: 5 %
Neutro Abs: 3.7 10*3/uL (ref 1.7–7.7)
Neutrophils Relative %: 49 %
Platelets: 408 10*3/uL — ABNORMAL HIGH (ref 150–400)
RBC: 5.43 MIL/uL (ref 4.22–5.81)
RDW: 13.1 % (ref 11.5–15.5)
WBC: 7.4 10*3/uL (ref 4.0–10.5)
nRBC: 0 % (ref 0.0–0.2)

## 2023-03-29 LAB — COMPREHENSIVE METABOLIC PANEL
ALT: 15 U/L (ref 0–44)
AST: 13 U/L — ABNORMAL LOW (ref 15–41)
Albumin: 3.6 g/dL (ref 3.5–5.0)
Alkaline Phosphatase: 99 U/L (ref 38–126)
Anion gap: 11 (ref 5–15)
BUN: 10 mg/dL (ref 6–20)
CO2: 25 mmol/L (ref 22–32)
Calcium: 9.3 mg/dL (ref 8.9–10.3)
Chloride: 98 mmol/L (ref 98–111)
Creatinine, Ser: 0.89 mg/dL (ref 0.61–1.24)
GFR, Estimated: >60 mL/min (ref >60)
Glucose, Bld: 111 mg/dL — ABNORMAL HIGH (ref 70–99)
Potassium: 4.1 mmol/L (ref 3.5–5.1)
Sodium: 134 mmol/L — ABNORMAL LOW (ref 135–145)
Total Bilirubin: 0.2 mg/dL (ref 0.0–1.2)
Total Protein: 8.5 g/dL — ABNORMAL HIGH (ref 6.5–8.1)

## 2023-03-29 LAB — GROUP A STREP BY PCR: Group A Strep by PCR: NOT DETECTED

## 2023-03-29 MED ORDER — ACETAMINOPHEN 500 MG PO TABS
1000.0000 mg | ORAL_TABLET | Freq: Once | ORAL | Status: AC
  Administered 2023-03-29: 1000 mg via ORAL
  Filled 2023-03-29: qty 2

## 2023-03-29 MED ORDER — IOHEXOL 300 MG/ML  SOLN
75.0000 mL | Freq: Once | INTRAMUSCULAR | Status: AC | PRN
  Administered 2023-03-29: 75 mL via INTRAVENOUS

## 2023-03-29 NOTE — ED Provider Notes (Incomplete)
[facility] Provider Note   CSN: [id number] Arrival date & time: 03/29/23  1547     History {Add pertinent medical, surgical, social history, OB history to HPI:1} Chief Complaint  Patient presents with   Sore Throat   Otalgia    LACEDRIC GAUGH is a 48 y.o. male who presents with left-sided neck pain, painful swallowing, pain with cervical range of motion that started Saturday.  Patient states he woke up and felt like his ear was painful.  Does not feel congested no ear drainage.  No dental pain.  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  Of note he has additionally had numerous imaging and biopsies, benign for heterogeneous thyroid.  He has an additional follow-up appointment later this month.  Denies any fevers or chills.   Sore Throat  Otalgia      Home Medications Prior to Admission medications   Medication Sig Start Date End Date Taking? Authorizing Provider  acetaminophen (TYLENOL) 500 MG tablet Take 500 mg by mouth every 6 (six) hours as needed for moderate pain (pain score 4-6).    [provider]  amLODipine (NORVASC) 10 MG tablet Take 1 tablet (10 mg total) by mouth daily. 01/09/23   [name], MD  apixaban (ELIQUIS) 5 MG TABS tablet Take 1 tablet (5 mg total) by mouth 2 (two) times daily. Start taking after completion of starter pack. 08/21/22   Nada Libman, MD  apixaban (ELIQUIS) 5 MG TABS tablet Take 1 tablet (5 mg total) by mouth 2 (two) times daily. Restart once post-operative bleeding has stopped (usually 2-3 days after surgery) Patient not taking: Reported on 03/17/2023 11/21/22     budesonide (PULMICORT) 0.5 MG/2ML nebulizer solution Mix 4 mLs (1 mg total/2 respules) itno 240 ml of nasal saline irrigation once daily. 01/09/23     carvedilol (COREG) 25 MG tablet Take 1 tablet (25 mg total) by mouth 2 (two) times daily with a meal. 01/09/23   [name], MD  HYDROcodone-acetaminophen (NORCO/VICODIN) 5-325 MG tablet Take 1 tablet by mouth every 6 (six) hours as needed. 03/17/23   Gerhard Munch, MD  losartan (COZAAR) 100 MG tablet Take 1 tablet (100 mg total) by mouth daily. 01/09/23   [name], MD  methimazole (TAPAZOLE) 10 MG tablet Take 1 tablet (10 mg total) by mouth daily. Patient not taking: Reported on 03/17/2023 01/09/23   [name], MD  methocarbamol (ROBAXIN) 750 MG tablet Take 1 tablet (750 mg total) by mouth 2 (two) times daily as needed for muscle spasms. 03/08/23   Delorse Lek, FNP  sildenafil (VIAGRA) 100 MG tablet Take 0.5-1 tablets (50-100 mg total) by mouth daily as needed for erectile dysfunction. 04/20/22   Claiborne Rigg, NP  spironolactone (ALDACTONE) 50 MG tablet Take 1 tablet (50 mg total) by mouth daily. 01/09/23   [name], MD      Allergies    Doxycycline and Penicillins    Review of Systems   Review of Systems  HENT:  Positive for ear pain.     Physical Exam Updated Vital Signs BP 123/86   Pulse 72   Temp 97.9 F (36.6 C) (Oral)   Resp 18   Ht 6\' 2"  (1.88 m)   Wt 97.1 kg   SpO2 96%   BMI 27.48 kg/m  Physical Exam Vitals and nursing note reviewed.  Constitutional:  General: He is not in acute distress.    Appearance: He is well-developed.  HENT:     Head: Normocephalic and atraumatic.     Comments: Poor dentition noted without obvious abscess, no peritonsillar abscess appreciated, no trismus or change in phonation, no pain with tongue protrusion, some discomfort with end ranges of cervical range of motion, bilateral cervical lymphadenopathy appreciated, right ear canal is impacted, left TM is without erythema or bulge, patient does have some left-sided tenderness at the base of the neck    Nose: No congestion or rhinorrhea.     Mouth/Throat:     Pharynx: No oropharyngeal exudate.  Eyes:     Conjunctiva/sclera: Conjunctivae normal.  Cardiovascular:      Rate and Rhythm: Normal rate and regular rhythm.     Heart sounds: No murmur heard. Pulmonary:     Effort: Pulmonary effort is normal. No respiratory distress.     Breath sounds: Normal breath sounds.  Abdominal:     Palpations: Abdomen is soft.     Tenderness: There is no abdominal tenderness.  Musculoskeletal:        General: No swelling.     Cervical back: Neck supple.  Skin:    General: Skin is warm and dry.     Capillary Refill: Capillary refill takes less than 2 seconds.  Neurological:     Mental Status: He is alert.  Psychiatric:        Mood and Affect: Mood normal.     ED Results / Procedures / Treatments   Labs (all labs ordered are listed, but only abnormal results are displayed) Labs Reviewed  CBC WITH DIFFERENTIAL/PLATELET - Abnormal; Notable for the following components:      Result Value   Platelets 408 (*)    All other components within normal limits  COMPREHENSIVE METABOLIC PANEL - Abnormal; Notable for the following components:   Sodium 134 (*)    Glucose, Bld 111 (*)    Total Protein 8.5 (*)    AST 13 (*)    All other components within normal limits  GROUP A STREP BY PCR    EKG None  Radiology CT Soft Tissue Neck W Contrast Result Date: 03/29/2023 CLINICAL DATA:  Left otalgia and throat pain EXAM: CT NECK WITH CONTRAST TECHNIQUE: Multidetector CT imaging of the neck was performed using the standard protocol following the bolus administration of intravenous contrast. RADIATION DOSE REDUCTION: This exam was performed according to the departmental dose-optimization program which includes automated exposure control, adjustment of the mA and/or kV according to patient size and/or use of iterative reconstruction technique. CONTRAST:  75mL OMNIPAQUE IOHEXOL 300 MG/ML  SOLN COMPARISON:  None Available. FINDINGS: Pharynx and larynx: The palatine tonsils are enlarged. There is no peritonsillar abscess. No retropharyngeal abnormality. The epiglottis and larynx are  normal. Salivary glands: No inflammation, mass, or stone. Thyroid: Heterogeneous and enlarged thyroid gland. Lymph nodes: None enlarged or abnormal density. Vascular: Negative. Limited intracranial: Negative. Visualized orbits: Negative. Mastoids and visualized paranasal sinuses: Clear. Skeleton: No acute or aggressive process. Upper chest: Negative. Other: None. IMPRESSION: 1. Enlarged palatine tonsils without peritonsillar abscess, compatible with tonsillopharyngitis. 2. Heterogeneous and enlarged thyroid gland. This has been previously imaged with ultrasound on 09/03/2022. Electronically Signed   By: Deatra Robinson M.D.   On: 03/29/2023 21:39    Procedures Procedures  {Document cardiac monitor, telemetry assessment procedure when appropriate:1}  Medications Ordered in ED Medications  acetaminophen (TYLENOL) tablet 1,000 mg (1,000 mg Oral Given 03/29/23 1921)  iohexol (OMNIPAQUE) 300 MG/ML solution 75 mL (75 mLs Intravenous Contrast Given 03/29/23 2021)    ED Course/ Medical Decision Making/ A&P   {   Click here for ABCD2, HEART and other calculatorsREFRESH Note before signing :1}                              Medical Decision Making Amount and/or Complexity of Data Reviewed Labs: ordered. Radiology: ordered.  Risk OTC drugs. Prescription drug management.   This patient presents to the ED with chief complaint(s) of left neck pain.  The complaint involves an extensive differential diagnosis and also carries with it a high risk of complications and morbidity.   pertinent past medical history as listed in HPI  The differential diagnosis includes  AOM, strep, dental abscess, peritonsillar retropharyngeal abscess, malignancy The initial plan is to  Will obtain basic labs and CT soft tissue neck Additional history obtained:  Records reviewed previous admission documents and Care Everywhere/External Records  Initial Assessment:   Hemodynamically stable, afebrile, nontoxic-appearing  patient presenting with left-sided neck pain with painful swallowing.  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  Clinical exam is not consistent with dental or peritonsillar abscess.  No exudate to suggest strep.  Ear exam not consistent with AOM.  Will obtain CT scan to rule out retropharyngeal abscess or mass.  Independent ECG interpretation:  None  Independent labs interpretation:  The following labs were independently interpreted:  CMP and CBC without significant abnormality, strep negative  Independent visualization and interpretation of imaging: I independently visualized the following imaging with scope of interpretation limited to determining acute life threatening conditions related to emergency care: CT soft tissue neck with contrast, which revealed consistent with tonsillopharyngitis, heterogeneous thyroid. Treatment and Reassessment: Patient given Tylenol 1000 mg following first assessment  Consultations obtained:   none  Disposition:   Patient be discharged home.  Encouraged use Tylenol 1000 mg 3 times a day for pain.  And to follow-up with primary care doctor within the the next 5 days to ensure symptoms are improving. The patient has been appropriately medically screened and/or stabilized in the ED. I have low suspicion for any other emergent medical condition which would require further screening, evaluation or treatment in the ED or require inpatient management. At time of discharge the patient is hemodynamically stable and in no acute distress. I have discussed work-up results and diagnosis with patient and answered all questions. Patient is agreeable with discharge plan. We discussed strict return precautions for returning to the emergency department and they verbalized understanding.     Social Determinants of Health:   none  This note was dictated with voice recognition software.  Despite best efforts at  proofreading, errors may have occurred which can change the documentation meaning.    {Document critical care time when appropriate:1} {Document review of labs and clinical decision tools ie heart score, Chads2Vasc2 etc:1}  {Document your independent review of radiology images, and any outside records:1} {Document your discussion with family members, caretakers, and with consultants:1} {Document social determinants of health affecting pt's care:1} {Document your decision making why or why not admission, treatments were needed:1} Final Clinical Impression(s) / ED Diagnoses Final diagnoses:  None    Rx / DC Orders ED Discharge Orders     None

## 2023-03-29 NOTE — Discharge Instructions (Signed)
You were evaluated in the emergency room for painful swallowing and neck pain.  Your strep test was negative, your lab work did not show any significant abnormality.  Your CT was consistent with inflammation of both your tonsils and your pharynx often due to viral infection.  I will continue use Tylenol 1000 mg as needed up to 3 times a day and follow-up with your primary care doctor within 5 days to ensure your symptoms are improving.  If you experience any new or worsening symptoms including fevers, chills, difficulty swallowing or breathing please return to the emergency room.

## 2023-03-29 NOTE — ED Triage Notes (Signed)
Pt reports left otalgia that started Sat, now also having throat pain with left sided pressure and pain, hx of strep throat, denies any other sxs

## 2023-03-30 ENCOUNTER — Encounter: Payer: Self-pay | Admitting: Physician Assistant

## 2023-04-09 ENCOUNTER — Other Ambulatory Visit: Payer: Self-pay

## 2023-04-09 ENCOUNTER — Ambulatory Visit: Payer: Medicaid Other | Attending: Internal Medicine | Admitting: Internal Medicine

## 2023-04-09 VITALS — BP 123/86 | HR 75 | Temp 98.2°F | Resp 16 | Ht 74.5 in | Wt 211.0 lb

## 2023-04-09 DIAGNOSIS — Z5986 Financial insecurity: Secondary | ICD-10-CM

## 2023-04-09 DIAGNOSIS — R310 Gross hematuria: Secondary | ICD-10-CM | POA: Diagnosis not present

## 2023-04-09 DIAGNOSIS — R1030 Lower abdominal pain, unspecified: Secondary | ICD-10-CM | POA: Diagnosis not present

## 2023-04-09 DIAGNOSIS — I10 Essential (primary) hypertension: Secondary | ICD-10-CM | POA: Diagnosis not present

## 2023-04-09 DIAGNOSIS — C119 Malignant neoplasm of nasopharynx, unspecified: Secondary | ICD-10-CM

## 2023-04-09 DIAGNOSIS — N400 Enlarged prostate without lower urinary tract symptoms: Secondary | ICD-10-CM

## 2023-04-09 DIAGNOSIS — K0889 Other specified disorders of teeth and supporting structures: Secondary | ICD-10-CM | POA: Diagnosis not present

## 2023-04-09 DIAGNOSIS — M5416 Radiculopathy, lumbar region: Secondary | ICD-10-CM

## 2023-04-09 DIAGNOSIS — E0590 Thyrotoxicosis, unspecified without thyrotoxic crisis or storm: Secondary | ICD-10-CM

## 2023-04-09 MED ORDER — CLINDAMYCIN HCL 300 MG PO CAPS
300.0000 mg | ORAL_CAPSULE | Freq: Three times a day (TID) | ORAL | 0 refills | Status: DC
  Filled 2023-04-09: qty 21, 7d supply, fill #0

## 2023-04-09 NOTE — Progress Notes (Unsigned)
Patient ID: [name], male    DOB: 10-May-1975  MRN: [id number]  CC: Hypertension   Subjective: [name] is a 48 y.o. male who presents for chronic ds management. His concerns today include:  Hx of poorly controlled  HTN, thyroid nodules (bx RT mid and lower 2024/endocrine [facility]. Benign), Vit D def,  preDM, tob dep, poor dentition, chronic lower back pain, history of reflex syncope, callus of left Achilles tendon, nasopharyngeal CA/sarcoma.   Discussed the use of AI scribe software for clinical note transcription with the patient, who gave verbal consent to proceed.  History of Present Illness   Presents for f/u with multiple concerns.  Chronic lower back pain: Had MRI of the lumbar spine in November.  This showed: 1. New right subarticular disc protrusion at L5-S1 posteriorly displacing the descending right S1 nerve root. 2. Overall progressive multilevel lumbar spondylosis as described above. New left foraminal disc protrusion at L3-L4 displacing the exiting left L3 nerve root. 3. Decreased central disc protrusion at L4-L5 with improved now mild spinal canal and lateral recess stenosis. 4. New L2 superior endplate Schmorl's node with prominent surrounding marrow edema, which can be a source of pain.  Patient referred to [facility] and saw Dr. Maisie Fus.  He stated that patient was not having significant radicular symptoms at the time so he recommended physical therapy first.  ESI could be an option in the future but not now given that patient was still on Eliquis at that time.  Patient states that he did not give anything to help him manage the pain.  Still takes Tylenol and Robaxin for his back pain.  Seen in the emergency room twice this month.  Wanted to go over results of labs and imaging studies from those visits.  He was seen 03/17/2023 for right inguinal and lower abdominal pain.  CT of the abdomen and pelvis revealed no significant findings.  CBC was normal  as well as lipase level.  Sodium level slightly low  and BS 159.  Hx of preDM.  Prescribed a limited course of hydrocodone which helped. Still gets soreness in both inguinal areas.  Has not seen any mass or bulging in the groin area.  Seen in ER again 03/29/2023 with left-sided neck/ear pain and painful swallowing.  Strep test negative.  Sodium level slightly low but improved from previous.  CBC normal.  CT scan of the neck revealed enlarged palate teen tonsils without peritonsillar abscess compatible with tonsillopharyngitis.  Enlarged thyroid noted.  He was not prescribed antibiotics. Despite not being prescribed antibiotics, the patient reports that the inflammation has improved and resolved with the use of Tylenol and black seed oil.  Gross Hematuria:  The patient also reports an episode of hematuria last week, which was a one-time occurrence. He denies any associated symptoms such as dysuria or changes in urinary frequency. Noted to have enlarged prostate on CT abdomen and pelvis done through [facility] on 02/14/2023.  Nasopharyngeal CA/sarcoma: He has followed up with oncologist Dr. Lin Givens at [facility].  Discussion was had chemotherapy since they did not get all of the tumor.  He tells me that they ultimately decided to hold off on chemotherapy since tumor had been slow growing for yrs. Plan for active surveillance with repeat imaging intermittently over the next few years.  He has a follow-up appointment with the ENT in early March.  Oncologist has discontinued the Eliquis.  HTN: Reports compliance with his medications which include carvedilol  25 mg twice a day, amlodipine 10 mg daily, spironolactone 50 mg daily and Cozaar 100 mg daily.  He has not taken the carvedilol as yet for today.    Having some dental pain.  Request abx.  Have upcoming appt with dentist.  Endocrinologist has taken him off tapazole.  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   Pulmonary embolism on right (HCC) 09/24/2022   Nasal cavity mass 09/24/2022   Acute deep vein thrombosis (DVT) of femoral vein of right lower extremity (HCC) 08/21/2022   Neck pain 08/14/2021   Prediabetes 01/28/2021   Vitamin D deficiency 01/28/2021   Pre-ulcerative corn or callous 02/01/2019   Disorder of left Achilles tendon 02/01/2019   Inguinal hernia of left side without obstruction or gangrene 12/08/2016   Former tobacco use 12/08/2016   Localized swelling, mass and lump, multiple sites 12/08/2016   Subcutaneous nodules 07/09/2016   Periodontitis 05/27/2015   Pain in left wrist 05/27/2015   HTN (hypertension) 05/12/2015     Current Outpatient Medications on File Prior to Visit  Medication Sig Dispense Refill   acetaminophen (TYLENOL) 500 MG tablet Take 500 mg by mouth every 6 (six) hours as needed for moderate pain (pain score 4-6).     amLODipine (NORVASC) 10 MG tablet Take 1 tablet (10 mg total) by mouth daily. 90 tablet 1   budesonide (PULMICORT) 0.5 MG/2ML nebulizer solution Mix 4 mLs (1 mg total/2 respules) itno 240 ml of nasal saline irrigation once daily. 120 mL 11   carvedilol (COREG) 25 MG tablet Take 1 tablet (25 mg total) by mouth 2 (two) times daily with a meal. 180 tablet 1   losartan (COZAAR) 100 MG tablet Take 1 tablet (100 mg total) by mouth daily. 90 tablet 1   methocarbamol (ROBAXIN) 750 MG tablet Take 1 tablet (750 mg total) by mouth 2 (two) times daily as needed for muscle spasms. 60 tablet 1   sildenafil (VIAGRA) 100 MG tablet Take 0.5-1 tablets (50-100 mg total) by mouth daily as needed for erectile dysfunction. 10 tablet 11   spironolactone (ALDACTONE) 50 MG tablet Take 1 tablet (50 mg total) by mouth daily. 90 tablet 1   methimazole (TAPAZOLE) 10 MG tablet Take 1 tablet (10 mg total) by mouth daily. (Patient not taking: Reported on 04/09/2023) 90 tablet 1   No current facility-administered medications on file prior to visit.     Allergies  Allergen Reactions   Doxycycline Other (See Comments)    Possible allergen. Developed Bell's Palsy the day after taking antibiotic.   Penicillins Swelling and Rash    Social History   Socioeconomic History   Marital status: Single    Spouse name: Not on file   Number of children: 3   Years of education: Not on file   Highest education level: Some college, no degree  Occupational History   Occupation: manual labor  Tobacco Use   Smoking status: Former    Current packs/day: 0.00    Types: Cigars, Cigarettes    Quit date: 05/10/2015    Years since quitting: 7.9   Smokeless tobacco: Never   Tobacco comments:    Occ black and  milds   Vaping Use   Vaping status: Never Used  Substance and Sexual Activity   Alcohol use: Yes    Comment: occ   Drug use: Not Currently    Types: Marijuana   Sexual activity: Not on file  Other Topics Concern  Not on file  Social History Narrative   Not on file   Social Drivers of Health   Financial Resource Strain: Medium Risk (04/09/2023)   Overall Financial Resource Strain (CARDIA)    Difficulty of Paying Living Expenses: Somewhat hard  Food Insecurity: Unknown (04/09/2023)   Hunger Vital Sign    Worried About Running Out of Food in the Last Year: Patient declined    Ran Out of Food in the Last Year: Never true  Transportation Needs: No Transportation Needs (04/09/2023)   PRAPARE - Administrator, Civil Service (Medical): No    Lack of Transportation (Non-Medical): No  Physical Activity: Sufficiently Active (04/09/2023)   Exercise Vital Sign    Days of Exercise per Week: 4 days    Minutes of Exercise per Session: 60 min  Stress: No Stress Concern Present (04/09/2023)   [employer] of Occupational Health - Occupational Stress Questionnaire    Feeling of Stress : Only a little  Social Connections: Unknown (04/09/2023)   Social Connection and Isolation Panel [NHANES]    Frequency of Communication with  Friends and Family: More than three times a week    Frequency of Social Gatherings with Friends and Family: Twice a week    Attends Religious Services: Never    Database administrator or Organizations: No    Attends Banker Meetings: Never    Marital Status: Patient declined  Catering manager Violence: Not At Risk (04/09/2023)   Humiliation, Afraid, Rape, and Kick questionnaire    Fear of Current or Ex-Partner: No    Emotionally Abused: No    Physically Abused: No    Sexually Abused: No    Family History  Problem Relation Age of Onset   Cancer Mother        breast cancer    Past Surgical History:  Procedure Laterality Date   CHOLECYSTECTOMY      ROS: Review of Systems Negative except as stated above  PHYSICAL EXAM: BP 123/86 (BP Location: Left Arm, Patient Position: Sitting, Cuff Size: Large)   Pulse 75   Temp 98.2 F (36.8 C) (Oral)   Resp 16   Ht 6' 2.5" (1.892 m)   Wt 211 lb (95.7 kg)   SpO2 99%   BMI 26.73 kg/m   Wt Readings from Last 3 Encounters:  04/09/23 211 lb (95.7 kg)  03/29/23 214 lb (97.1 kg)  03/17/23 214 lb (97.1 kg)    Physical Exam   General appearance - alert, well appearing, middle age AAM and in no distress Mental status - normal mood, behavior, speech, dress, motor activity, and thought processes Mouth: Tonsils do not appear enlarged or erythematous.  No exudates.  No cervical lymphadenopathy.  He has dental cavities.  No signs of dental abscess. Chest - clear to auscultation, no wheezes, rales or rhonchi, symmetric air entry Heart - normal rate, regular rhythm, normal S1, S2, no murmurs, rubs, clicks or gallops GU: Declines inguinal exam    Latest Ref Rng & Units 03/29/2023    7:27 PM 03/17/2023    7:09 PM 11/13/2022   11:49 AM  CMP  Glucose 70 - 99 mg/dL 098  119  147   BUN 6 - 20 mg/dL 10  9  9    Creatinine 0.61 - 1.24 mg/dL 8.29  5.62  1.30   Sodium 135 - 145 mmol/L 134  132  137   Potassium 3.5 - 5.1 mmol/L 4.1  3.7   4.0   Chloride  98 - 111 mmol/L 98  100  102   CO2 22 - 32 mmol/L 25  23  30    Calcium 8.9 - 10.3 mg/dL 9.3  9.3  9.2   Total Protein 6.5 - 8.1 g/dL 8.5  8.9  7.8   Total Bilirubin 0.0 - 1.2 mg/dL 0.2  0.6  0.3   Alkaline Phos 38 - 126 U/L 99  96  89   AST 15 - 41 U/L 13  14  8    ALT 0 - 44 U/L 15  18  11     Lipid Panel     Component Value Date/Time   CHOL 129 10/19/2020 1136   TRIG 114 10/19/2020 1136   HDL 39 (L) 10/19/2020 1136   CHOLHDL 3.3 10/19/2020 1136   LDLCALC 69 10/19/2020 1136    CBC    Component Value Date/Time   WBC 7.4 03/29/2023 1927   RBC 5.43 03/29/2023 1927   HGB 15.3 03/29/2023 1927   HGB 15.1 11/13/2022 1149   HGB 17.4 04/05/2022 1010   HCT 45.6 03/29/2023 1927   HCT 51.9 (H) 04/05/2022 1010   PLT 408 (H) 03/29/2023 1927   PLT 300 11/13/2022 1149   PLT 285 04/05/2022 1010   MCV 84.0 03/29/2023 1927   MCV 86 04/05/2022 1010   MCH 28.2 03/29/2023 1927   MCHC 33.6 03/29/2023 1927   RDW 13.1 03/29/2023 1927   RDW 12.9 04/05/2022 1010   LYMPHSABS 3.2 03/29/2023 1927   LYMPHSABS 2.7 04/05/2022 1010   MONOABS 0.4 03/29/2023 1927   EOSABS 0.1 03/29/2023 1927   EOSABS 0.1 04/05/2022 1010   BASOSABS 0.1 03/29/2023 1927   BASOSABS 0.0 04/05/2022 1010    ASSESSMENT AND PLAN: 1. Lumbar radiculopathy (Primary) Chronic with significant findings on MRI.  He plans to call and schedule the physical therapy as was recommended by the neurosurgeon.  He is agreeable for referral to pain management. - Ambulatory referral to Pain Clinic  2. Nasopharyngeal cancer (HCC) Followed by [facility] oncology and ENT.  Since his surgery in September, plan is for active surveillance.  3. Gross hematuria - Ambulatory referral to Urology  4. Prostate enlargement Patient denies any symptoms to suggest BPH. - Ambulatory referral to Urology  5. Essential hypertension Blood pressure improved but not at goal.  He has not taken his carvedilol as yet for today.  He  will do so when he returns home.  Continue blood pressure medications as listed above.  6. Pain, dental - clindamycin (CLEOCIN) 300 MG capsule; Take 1 capsule (300 mg total) by mouth 3 (three) times daily.  Dispense: 21 capsule; Refill: 0  7. Inguinal pain, unspecified laterality Patient reports he does not feel any abnormal mass in the inguinal area.  Recent CAT scans have not showed any hernias.  I spent 44 minutes dedicated to the care of this patient on the day of this visit to include previsit review of chart including notes and imaging studies on Care Everywhere, from ER visits and visit with neurosurgeon, face-to-face time with patient discussing diagnosis and management and post visit entering of orders.  Patient was given the opportunity to ask questions.  Patient verbalized understanding of the plan and was able to repeat key elements of the plan.   This documentation was completed using Paediatric nurse.  Any transcriptional errors are unintentional.  Orders Placed This Encounter  Procedures   Ambulatory referral to Pain Clinic   Ambulatory referral to Urology  Requested Prescriptions   Signed Prescriptions Disp Refills   clindamycin (CLEOCIN) 300 MG capsule 21 capsule 0    Sig: Take 1 capsule (300 mg total) by mouth 3 (three) times daily.    Return in about 4 months (around 08/07/2023).  Jonah Blue, MD, FACP

## 2023-04-09 NOTE — Progress Notes (Unsigned)
Concerns about pelvis, thyroid, labs

## 2023-04-11 ENCOUNTER — Other Ambulatory Visit: Payer: Self-pay

## 2023-04-16 DIAGNOSIS — Z0271 Encounter for disability determination: Secondary | ICD-10-CM

## 2023-04-17 ENCOUNTER — Encounter: Payer: Self-pay | Admitting: Internal Medicine

## 2023-05-08 ENCOUNTER — Ambulatory Visit (INDEPENDENT_AMBULATORY_CARE_PROVIDER_SITE_OTHER): Payer: Medicaid Other | Admitting: Primary Care

## 2023-05-08 DIAGNOSIS — E042 Nontoxic multinodular goiter: Secondary | ICD-10-CM | POA: Diagnosis not present

## 2023-05-08 DIAGNOSIS — E0590 Thyrotoxicosis, unspecified without thyrotoxic crisis or storm: Secondary | ICD-10-CM | POA: Diagnosis not present

## 2023-05-09 ENCOUNTER — Telehealth (INDEPENDENT_AMBULATORY_CARE_PROVIDER_SITE_OTHER): Payer: Self-pay | Admitting: Primary Care

## 2023-05-09 NOTE — Telephone Encounter (Signed)
 Called pt to confirm atp. Pt was unavailable.

## 2023-05-13 DIAGNOSIS — E042 Nontoxic multinodular goiter: Secondary | ICD-10-CM | POA: Diagnosis not present

## 2023-05-15 ENCOUNTER — Other Ambulatory Visit: Payer: Self-pay

## 2023-05-15 ENCOUNTER — Observation Stay (HOSPITAL_COMMUNITY)

## 2023-05-15 ENCOUNTER — Encounter (HOSPITAL_COMMUNITY): Payer: Self-pay

## 2023-05-15 ENCOUNTER — Telehealth (INDEPENDENT_AMBULATORY_CARE_PROVIDER_SITE_OTHER): Payer: Self-pay

## 2023-05-15 ENCOUNTER — Observation Stay (HOSPITAL_COMMUNITY)
Admission: EM | Admit: 2023-05-15 | Discharge: 2023-05-18 | Disposition: A | Discharge status: Home / Self Care | Attending: Internal Medicine | Admitting: Internal Medicine

## 2023-05-15 ENCOUNTER — Encounter: Payer: Self-pay | Admitting: Hematology and Oncology

## 2023-05-15 DIAGNOSIS — C300 Malignant neoplasm of nasal cavity: Secondary | ICD-10-CM | POA: Diagnosis not present

## 2023-05-15 DIAGNOSIS — E0520 Thyrotoxicosis with toxic multinodular goiter without thyrotoxic crisis or storm: Secondary | ICD-10-CM | POA: Diagnosis not present

## 2023-05-15 DIAGNOSIS — Z79899 Other long term (current) drug therapy: Secondary | ICD-10-CM | POA: Insufficient documentation

## 2023-05-15 DIAGNOSIS — J3489 Other specified disorders of nose and nasal sinuses: Secondary | ICD-10-CM | POA: Diagnosis not present

## 2023-05-15 DIAGNOSIS — I2609 Other pulmonary embolism with acute cor pulmonale: Secondary | ICD-10-CM | POA: Diagnosis present

## 2023-05-15 DIAGNOSIS — F17290 Nicotine dependence, other tobacco product, uncomplicated: Secondary | ICD-10-CM | POA: Diagnosis not present

## 2023-05-15 DIAGNOSIS — F1090 Alcohol use, unspecified, uncomplicated: Secondary | ICD-10-CM | POA: Diagnosis not present

## 2023-05-15 DIAGNOSIS — Z87891 Personal history of nicotine dependence: Secondary | ICD-10-CM | POA: Diagnosis not present

## 2023-05-15 DIAGNOSIS — C319 Malignant neoplasm of accessory sinus, unspecified: Secondary | ICD-10-CM | POA: Diagnosis not present

## 2023-05-15 DIAGNOSIS — I2699 Other pulmonary embolism without acute cor pulmonale: Principal | ICD-10-CM | POA: Diagnosis present

## 2023-05-15 DIAGNOSIS — Z86718 Personal history of other venous thrombosis and embolism: Secondary | ICD-10-CM | POA: Diagnosis not present

## 2023-05-15 DIAGNOSIS — Z86711 Personal history of pulmonary embolism: Secondary | ICD-10-CM | POA: Diagnosis not present

## 2023-05-15 DIAGNOSIS — Z8659 Personal history of other mental and behavioral disorders: Secondary | ICD-10-CM | POA: Insufficient documentation

## 2023-05-15 DIAGNOSIS — Z9889 Other specified postprocedural states: Secondary | ICD-10-CM | POA: Diagnosis not present

## 2023-05-15 DIAGNOSIS — R7303 Prediabetes: Secondary | ICD-10-CM | POA: Diagnosis not present

## 2023-05-15 DIAGNOSIS — Z8522 Personal history of malignant neoplasm of nasal cavities, middle ear, and accessory sinuses: Secondary | ICD-10-CM | POA: Diagnosis not present

## 2023-05-15 DIAGNOSIS — G245 Blepharospasm: Secondary | ICD-10-CM | POA: Diagnosis not present

## 2023-05-15 DIAGNOSIS — H6121 Impacted cerumen, right ear: Secondary | ICD-10-CM | POA: Diagnosis not present

## 2023-05-15 DIAGNOSIS — I10 Essential (primary) hypertension: Secondary | ICD-10-CM | POA: Insufficient documentation

## 2023-05-15 DIAGNOSIS — R931 Abnormal findings on diagnostic imaging of heart and coronary circulation: Secondary | ICD-10-CM | POA: Diagnosis not present

## 2023-05-15 DIAGNOSIS — E0590 Thyrotoxicosis, unspecified without thyrotoxic crisis or storm: Secondary | ICD-10-CM | POA: Diagnosis not present

## 2023-05-15 HISTORY — DX: Other pulmonary embolism without acute cor pulmonale: I26.99

## 2023-05-15 LAB — COMPREHENSIVE METABOLIC PANEL
ALT: 17 U/L (ref 0–44)
AST: 15 U/L (ref 15–41)
Albumin: 3.5 g/dL (ref 3.5–5.0)
Alkaline Phosphatase: 102 U/L (ref 38–126)
Anion gap: 9 (ref 5–15)
BUN: 8 mg/dL (ref 6–20)
CO2: 26 mmol/L (ref 22–32)
Calcium: 9.4 mg/dL (ref 8.9–10.3)
Chloride: 101 mmol/L (ref 98–111)
Creatinine, Ser: 0.84 mg/dL (ref 0.61–1.24)
GFR, Estimated: >60 mL/min (ref >60)
Glucose, Bld: 120 mg/dL — ABNORMAL HIGH (ref 70–99)
Potassium: 3.9 mmol/L (ref 3.5–5.1)
Sodium: 136 mmol/L (ref 135–145)
Total Bilirubin: 0.4 mg/dL (ref 0.0–1.2)
Total Protein: 8.6 g/dL — ABNORMAL HIGH (ref 6.5–8.1)

## 2023-05-15 LAB — CBC
HCT: 47.3 % (ref 39.0–52.0)
Hemoglobin: 15.6 g/dL (ref 13.0–17.0)
MCH: 27.5 pg (ref 26.0–34.0)
MCHC: 33 g/dL (ref 30.0–36.0)
MCV: 83.3 fL (ref 80.0–100.0)
Platelets: 326 10*3/uL (ref 150–400)
RBC: 5.68 MIL/uL (ref 4.22–5.81)
RDW: 14.3 % (ref 11.5–15.5)
WBC: 8.2 10*3/uL (ref 4.0–10.5)
nRBC: 0 % (ref 0.0–0.2)

## 2023-05-15 MED ORDER — IOHEXOL 350 MG/ML SOLN
75.0000 mL | Freq: Once | INTRAVENOUS | Status: AC | PRN
  Administered 2023-05-15: 75 mL via INTRAVENOUS

## 2023-05-15 MED ORDER — HEPARIN BOLUS VIA INFUSION
4000.0000 [IU] | Freq: Once | INTRAVENOUS | Status: AC
  Administered 2023-05-15: 4000 [IU] via INTRAVENOUS
  Filled 2023-05-15: qty 4000

## 2023-05-15 MED ORDER — MELATONIN 3 MG PO TABS: 3.0000 mg | ORAL_TABLET | Freq: Every evening | ORAL | Status: DC | PRN

## 2023-05-15 MED ORDER — ACETAMINOPHEN 650 MG RE SUPP: 650.0000 mg | Freq: Four times a day (QID) | RECTAL | Status: DC | PRN

## 2023-05-15 MED ORDER — HEPARIN (PORCINE) 25000 UT/250ML-% IV SOLN
1700.0000 [IU]/h | INTRAVENOUS | Status: DC
  Administered 2023-05-15 – 2023-05-17 (×3): 1700 [IU]/h via INTRAVENOUS
  Filled 2023-05-15 (×3): qty 250

## 2023-05-15 MED ORDER — SODIUM CHLORIDE (PF) 0.9 % IJ SOLN
INTRAMUSCULAR | Status: AC
  Filled 2023-05-15: qty 50

## 2023-05-15 MED ORDER — ONDANSETRON HCL 4 MG/2ML IJ SOLN
4.0000 mg | Freq: Four times a day (QID) | INTRAMUSCULAR | Status: DC | PRN
Start: 2023-05-15 — End: 2023-05-18

## 2023-05-15 MED ORDER — ACETAMINOPHEN 325 MG PO TABS
650.0000 mg | ORAL_TABLET | Freq: Four times a day (QID) | ORAL | Status: DC | PRN
  Administered 2023-05-17: 650 mg via ORAL
  Filled 2023-05-15: qty 2

## 2023-05-15 NOTE — ED Provider Notes (Signed)
 [place] EMERGENCY DEPARTMENT AT [facility] Provider Note   CSN: [id number] Arrival date & time: 05/15/23  1532     History  Chief Complaint  Patient presents with   Abnormal CT    [name] is a 48 y.o. male with past medical history of alveolar rhabdomyosarcoma, HTN, DVT, PE presents to emergency department from recommendation of oncologist Dr. Grandville Silos ([facility]) d/t incidental finding of right PE on CT. 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  He endorses mild SHOB with exertion and while supine when getting CT scan today.  He also states that he had some "discomfort" of his left side of his chest that radiated into his left upper arm 2 days ago.  Currently no chest pain nor shortness of breath at rest.  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  He is not currently on thinners.  Attempted to review CT scan on epic.  However, scans was obtained through [facility] and I am unable to view results.  Results from scan today on patient's MyChart state "apparent filling defect within distal right pulmonary artery which tracks into the interlobar and right lower segmental and subsegmental arteries without right heart strain" on CT chest, abd pelvis  HPI     Home Medications Prior to Admission medications   Medication Sig Start Date End Date Taking? Authorizing Provider  acetaminophen (TYLENOL) 500 MG tablet Take 500 mg by mouth every 6 (six) hours as needed for moderate pain (pain score 4-6).    [provider]  amLODipine (NORVASC) 10 MG tablet Take 1 tablet (10 mg total) by mouth daily. 01/09/23   [name], MD  budesonide (PULMICORT) 0.5 MG/2ML nebulizer solution Mix 4 mLs (1 mg total/2 respules) itno 240 ml of nasal saline irrigation once daily. 01/09/23     carvedilol (COREG) 25 MG tablet Take 1 tablet (25 mg total) by mouth  2 (two) times daily with a meal. 01/09/23   [name], MD  clindamycin (CLEOCIN) 300 MG capsule Take 1 capsule (300 mg total) by mouth 3 (three) times daily. 04/09/23   [name], MD  losartan (COZAAR) 100 MG tablet Take 1 tablet (100 mg total) by mouth daily. 01/09/23   [name], MD  methimazole (TAPAZOLE) 10 MG tablet Take 1 tablet (10 mg total) by mouth daily. Patient not taking: Reported on 04/09/2023 01/09/23   [name], MD  methocarbamol (ROBAXIN) 750 MG tablet Take 1 tablet (750 mg total) by mouth 2 (two) times daily as needed for muscle spasms. 03/08/23   Delorse Lek, FNP  sildenafil (VIAGRA) 100 MG tablet Take 0.5-1 tablets (50-100 mg total) by mouth daily as needed for erectile dysfunction. 04/20/22   Claiborne Rigg, NP  spironolactone (ALDACTONE) 50 MG tablet Take 1 tablet (50 mg total) by mouth daily. 01/09/23   [name], MD      Allergies    Doxycycline and Penicillins    Review of Systems   Review of Systems  Constitutional:  Negative for chills, fatigue and fever.  Respiratory:  Negative for cough, chest tightness, shortness of breath and wheezing.   Cardiovascular:  Negative for chest pain and palpitations.  Gastrointestinal:  Negative for abdominal pain, constipation, diarrhea, nausea and vomiting.  Neurological:  Negative for dizziness, seizures, weakness, light-headedness, numbness and headaches.  Physical Exam Updated Vital Signs BP (!) 124/91   Pulse 67   Temp 98.5 F (36.9 C) (Oral)   Resp 19   Ht 6\' 2"  (1.88 m)   Wt 96.2 kg   SpO2 97%   BMI 27.22 kg/m  Physical Exam Vitals and nursing note reviewed.  Constitutional:      General: He is not in acute distress.    Appearance: Normal appearance. He is not ill-appearing or diaphoretic.  HENT:     Head: Normocephalic and atraumatic.  Eyes:     Conjunctiva/sclera: Conjunctivae normal.  Cardiovascular:     Rate and Rhythm: Normal rate.     Heart sounds:  Normal heart sounds.  Pulmonary:     Effort: Pulmonary effort is normal. No tachypnea, accessory muscle usage or respiratory distress.     Breath sounds: Normal breath sounds.  Chest:     Chest wall: No tenderness.  Musculoskeletal:     Right lower leg: No edema.     Left lower leg: No edema.     Comments: No calf swelling nor TTP. Negative homan's sign bilaterally  Skin:    Capillary Refill: Capillary refill takes less than 2 seconds.     Coloration: Skin is not jaundiced or pale.  Neurological:     Mental Status: He is alert and oriented to person, place, and time. Mental status is at baseline.     ED Results / Procedures / Treatments   Labs (all labs ordered are listed, but only abnormal results are displayed) Labs Reviewed  COMPREHENSIVE METABOLIC PANEL - Abnormal; Notable for the following components:      Result Value   Glucose, Bld 120 (*)    Total Protein 8.6 (*)    All other components within normal limits  CBC    EKG None  Radiology No results found.  Procedures Procedures    Medications Ordered in ED Medications - No data to display  ED Course/ Medical Decision Making/ A&P                                 Medical Decision Making Amount and/or Complexity of Data Reviewed Labs: ordered.  Risk Decision regarding hospitalization.   Patient presents to the ED for concern of PE found on CT scan, this involves an extensive number of treatment options, and is a complaint that carries with it a high risk of complications and morbidity.    Co morbidities that complicate the patient evaluation  Past medical history of DVT, PE Not currently on Eliquis Has Ibular rhabdomyosarcoma   Additional history obtained:  Additional history obtained from Nursing and Outside Medical Records   External records from outside source obtained and reviewed including  Triage RN note Message from oncologist regarding ED evaluation for PE   Lab Tests:  I Ordered,  and personally interpreted labs.  The pertinent results include:   CBG 120 Total protein 8.6    Cardiac Monitoring:  The patient was maintained on a cardiac monitor.  I personally viewed and interpreted the cardiac monitored which showed an underlying rhythm of: NSR with prolonged PR, STE in V2.  EKG similar to previous EKG 1 year ago   Medicines ordered and prescription drug management:  I ordered medication including heparin  for PE  Reevaluation of the patient after these medicines showed that the patient stayed the same I have reviewed the patients home medicines and have made adjustments  as needed    Consultations Obtained:  I requested consultation with the hospitalist,  and discussed lab and imaging findings as well as pertinent plan - Dr. Newton Pigg accepts patient for admission   Problem List / ED Course:  Acute PE Vital signs within normal limits here in ED.  No tachycardia nor hypoxia No complaints of shortness of breath nor chest pain currently however has had some shortness of breath on exertion and chest discomfort 2 days ago   Reevaluation:  After the interventions noted above, I reevaluated the patient and found that they have :stayed the same    Dispostion:  After consideration of the diagnostic results and the patients response to treatment, I feel that the patent would benefit from admission for heparin, echo, PE obs.   Final Clinical Impression(s) / ED Diagnoses Final diagnoses:  Acute pulmonary embolism, unspecified pulmonary embolism type, unspecified whether acute cor pulmonale present [facility])    Rx / DC Orders ED Discharge Orders     None         Judithann Sheen, PA 05/15/23 2317    Gwyneth Sprout, MD 05/20/23 2318

## 2023-05-15 NOTE — Progress Notes (Signed)
 PHARMACY - ANTICOAGULATION CONSULT NOTE  Pharmacy Consult for heparin Indication: pulmonary embolus  Allergies  Allergen Reactions   Doxycycline Other (See Comments)    Possible allergen. Developed Bell's Palsy the day after taking antibiotic.   Penicillins Swelling and Rash    Patient Measurements: Height: 6\' 2"  (188 cm) Weight: 96.2 kg (212 lb) IBW/kg (Calculated) : 82.2 Heparin Dosing Weight: 96.2kg   Vital Signs: Temp: 98.5 F (36.9 C) (03/05 2026) Temp Source: Oral (03/05 2026) BP: 124/91 (03/05 2026) Pulse Rate: 67 (03/05 2026)  Labs: Recent Labs    05/15/23 1655  HGB 15.6  HCT 47.3  PLT 326  CREATININE 0.84    Estimated Creatinine Clearance: 125 mL/min (by C-G formula based on SCr of 0.84 mg/dL).   Medical History: Past Medical History:  Diagnosis Date   Hypertension 2003   dx age 62    Wears glasses     Assessment: 48 y.o. male with past medical history of alveolar rhabdomyosarcoma, HTN, DVT, PE presents to EDt from recommendation of oncologist  d/t incidental finding of right PE on CT . 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  He is not currently on thinners. Pharmacy to dose heparin  CBC WNL  Goal of Therapy:  Heparin level 0.3-0.7 units/ml Monitor platelets by anticoagulation protocol: Yes   Plan:  Heparin bolus 4000 units x 1 Start heparin drip at 1700 units/hr Heparin level in 6 hours Daily CBC  Arley Phenix RPh 05/15/2023, 11:19 PM

## 2023-05-15 NOTE — H&P (Signed)
 History and Physical      [name] WGN:[id number] DOB: 07/04/1975 DOA: 05/15/2023; DOS: 05/15/2023  PCP: [name] Matar, MD *** Patient coming from: home ***  I have personally briefly reviewed patient's old medical records in [facility]  Chief Complaint: ***  HPI: [name] is a 48 y.o. male with medical history significant for *** who is admitted to [facility] on 05/15/2023 with *** after presenting from home*** to [facility] ED complaining of ***.   ***        ***  ED Course:  Vital signs in the ED were notable for the following: ***  Labs were notable for the following: ***  Per my interpretation, EKG in ED demonstrated the following:  ***  Imaging in the ED, per corresponding formal radiology read, was notable for the following: ***  While in the ED, the following were administered: ***  Subsequently, the patient was admitted  ***  ***red   Review of Systems: As per HPI otherwise 10 point review of systems negative.   Past Medical History:  Diagnosis Date   Hypertension 2003   dx age 44    Wears glasses     Past Surgical History:  Procedure Laterality Date   CHOLECYSTECTOMY      Social History:  reports that he quit smoking about 8 years ago. His smoking use included cigars and cigarettes. He has never used smokeless tobacco. He reports current alcohol use. He reports that he does not currently use drugs after having used the following drugs: Marijuana.   Allergies  Allergen Reactions   Doxycycline Other (See Comments)    Possible allergen. Developed Bell's Palsy the day after taking antibiotic.   Penicillins Swelling and Rash    Family History  Problem Relation Age of Onset   Cancer Mother        breast cancer    Family history reviewed and not pertinent ***   Prior to Admission medications   Medication Sig Start Date End Date Taking? Authorizing Provider  acetaminophen (TYLENOL) 500 MG tablet Take 500 mg by mouth  every 6 (six) hours as needed for moderate pain (pain score 4-6).    [provider]  amLODipine (NORVASC) 10 MG tablet Take 1 tablet (10 mg total) by mouth daily. 01/09/23   [name] Matar, MD  budesonide (PULMICORT) 0.5 MG/2ML nebulizer solution Mix 4 mLs (1 mg total/2 respules) itno 240 ml of nasal saline irrigation once daily. 01/09/23     carvedilol (COREG) 25 MG tablet Take 1 tablet (25 mg total) by mouth 2 (two) times daily with a meal. 01/09/23   [name] Matar, MD  clindamycin (CLEOCIN) 300 MG capsule Take 1 capsule (300 mg total) by mouth 3 (three) times daily. 04/09/23   [name] Matar, MD  losartan (COZAAR) 100 MG tablet Take 1 tablet (100 mg total) by mouth daily. 01/09/23   [name] Matar, MD  methimazole (TAPAZOLE) 10 MG tablet Take 1 tablet (10 mg total) by mouth daily. Patient not taking: Reported on 04/09/2023 01/09/23   [name] Matar, MD  methocarbamol (ROBAXIN) 750 MG tablet Take 1 tablet (750 mg total) by mouth 2 (two) times daily as needed for muscle spasms. 03/08/23   Delorse Lek, FNP  sildenafil (VIAGRA) 100 MG tablet Take 0.5-1 tablets (50-100 mg total) by mouth daily as needed for erectile dysfunction. 04/20/22   Claiborne Rigg, NP  spironolactone (ALDACTONE) 50 MG tablet Take 1 tablet (50  mg total) by mouth daily. 01/09/23   [name] Matar, MD     Objective    Physical Exam: Vitals:   05/15/23 1620 05/15/23 1620 05/15/23 1627 05/15/23 2026  BP:  (!) 123/90  (!) 124/91  Pulse: 80   67  Resp:  18  19  Temp:  98.7 F (37.1 C)  98.5 F (36.9 C)  TempSrc:  Oral  Oral  SpO2: 100%   97%  Weight:   96.2 kg   Height:   6\' 2"  (1.88 m)     General: appears to be stated age; alert, oriented Skin: warm, dry, no rash Head:  AT/[place] Mouth:  Oral mucosa membranes appear moist, normal dentition Neck: supple; trachea midline Heart:  RRR; did not appreciate any M/R/G Lungs: CTAB, did not appreciate any wheezes, rales, or rhonchi Abdomen:  + BS; soft, ND, NT Vascular: 2+ pedal pulses b/l; 2+ radial pulses b/l Extremities: no peripheral edema, no muscle wasting Neuro: strength and sensation intact in upper and lower extremities b/l    *** Neuro: 5/5 strength of the proximal and distal flexors and extensors of the upper and lower extremities bilaterally; sensation intact in upper and lower extremities b/l; cranial nerves II through XII grossly intact; no pronator drift; no evidence suggestive of slurred speech, dysarthria, or facial droop; Normal muscle tone. No tremors. *** Neuro: In the setting of the patient's current mental status and associated inability to follow instructions, unable to perform full neurologic exam at this time.  As such, assessment of strength, sensation, and cranial nerves is limited at this time. Patient noted to spontaneously move all 4 extremities. No tremors.  ***    Labs on Admission: I have personally reviewed following labs and imaging studies  CBC: Recent Labs  Lab 05/15/23 1655  WBC 8.2  HGB 15.6  HCT 47.3  MCV 83.3  PLT 326   Basic Metabolic Panel: Recent Labs  Lab 05/15/23 1655  NA 136  K 3.9  CL 101  CO2 26  GLUCOSE 120*  BUN 8  CREATININE 0.84  CALCIUM 9.4   GFR: Estimated Creatinine Clearance: 125 mL/min (by C-G formula based on SCr of 0.84 mg/dL). Liver Function Tests: Recent Labs  Lab 05/15/23 1655  AST 15  ALT 17  ALKPHOS 102  BILITOT 0.4  PROT 8.6*  ALBUMIN 3.5   No results for input(s): "LIPASE", "AMYLASE" in the last 168 hours. No results for input(s): "AMMONIA" in the last 168 hours. Coagulation Profile: No results for input(s): "INR", "PROTIME" in the last 168 hours. Cardiac Enzymes: No results for input(s): "CKTOTAL", "CKMB", "CKMBINDEX", "TROPONINI" in the last 168 hours. BNP (last 3 results) No results for input(s): "PROBNP" in the last 8760 hours. HbA1C: No results for input(s): "HGBA1C" in the last 72 hours. CBG: No results for input(s):  "GLUCAP" in the last 168 hours. Lipid Profile: No results for input(s): "CHOL", "HDL", "LDLCALC", "TRIG", "CHOLHDL", "LDLDIRECT" in the last 72 hours. Thyroid Function Tests: No results for input(s): "TSH", "T4TOTAL", "FREET4", "T3FREE", "THYROIDAB" in the last 72 hours. Anemia Panel: No results for input(s): "VITAMINB12", "FOLATE", "FERRITIN", "TIBC", "IRON", "RETICCTPCT" in the last 72 hours. Urine analysis:    Component Value Date/Time   COLORURINE YELLOW 03/17/2023 1909   APPEARANCEUR CLEAR 03/17/2023 1909   APPEARANCEUR Clear 05/07/2019 1359   LABSPEC 1.019 03/17/2023 1909   PHURINE 7.0 03/17/2023 1909   GLUCOSEU NEGATIVE 03/17/2023 1909   HGBUR NEGATIVE 03/17/2023 1909   BILIRUBINUR NEGATIVE 03/17/2023 1909   BILIRUBINUR  Negative 05/07/2019 1359   KETONESUR NEGATIVE 03/17/2023 1909   PROTEINUR 30 (A) 03/17/2023 1909   UROBILINOGEN 0.2 07/09/2016 1105   UROBILINOGEN 0.2 05/04/2014 1925   NITRITE NEGATIVE 03/17/2023 1909   LEUKOCYTESUR NEGATIVE 03/17/2023 1909    Radiological Exams on Admission: No results found.    Assessment/Plan    Principal Problem:   Acute pulmonary embolism (HCC)  ***              ***                ***               ***               ***               ***              ***               ***               ***               ***               ***               ***               ***              ***     DVT prophylaxis: SCD's ***  Code Status: Full code*** Family Communication: none*** Disposition Plan: Per Rounding Team Consults called: none***;  Admission status: ***    I SPENT GREATER THAN 75 *** MINUTES IN CLINICAL CARE TIME/MEDICAL DECISION-MAKING IN COMPLETING THIS ADMISSION.     Chaney Born Chon Buhl DO [facility] From 7PM -  7AM   05/15/2023, 11:18 PM   ***

## 2023-05-15 NOTE — ED Triage Notes (Signed)
 Pt arrived via POV. Had CT done today which was concerning for PE in distal R pulmonary artery. Very mild SOB  Had DVT last year. Has been off Eliquis since December.  AOx4

## 2023-05-15 NOTE — ED Provider Triage Note (Signed)
 Emergency Medicine Provider Triage Evaluation Note  [name] , a 48 y.o. male  was evaluated in triage.  Pt complains of PE.  Review of Systems  Positive:  Negative:   Physical Exam  BP (!) 123/90 (BP Location: Right Arm)   Pulse 80   Temp 98.7 F (37.1 C) (Oral)   Resp 18   Ht 6\' 2"  (1.88 m)   Wt 96.2 kg   SpO2 100%   BMI 27.22 kg/m  Gen:   Awake, no distress   Resp:  Normal effort  MSK:   Moves extremities without difficulty  Other:    Medical Decision Making  Medically screening exam initiated at 4:35 PM.  Appropriate orders placed.  [name] was informed that the remainder of the evaluation will be completed by another provider, this initial triage assessment does not replace that evaluation, and the importance of remaining in the ED until their evaluation is complete.  Apparently patient had CT chest done today which saw PE. Patient was asymptomatic. When provider asked patient if he had been SOB, patient said "maybe a little bit". Patient with PE last year too and stopped blood thinners in December 2024.   Dorthy Cooler, [place] 05/15/23 [phone]

## 2023-05-15 NOTE — Telephone Encounter (Signed)
 Called pt to remind them about appt. Pt did not answer and number is unavailable.

## 2023-05-16 ENCOUNTER — Telehealth: Payer: Self-pay | Admitting: Hematology and Oncology

## 2023-05-16 ENCOUNTER — Encounter (HOSPITAL_COMMUNITY): Payer: Self-pay | Admitting: Internal Medicine

## 2023-05-16 ENCOUNTER — Observation Stay (HOSPITAL_COMMUNITY)

## 2023-05-16 ENCOUNTER — Ambulatory Visit (INDEPENDENT_AMBULATORY_CARE_PROVIDER_SITE_OTHER): Payer: Medicaid Other | Admitting: Primary Care

## 2023-05-16 ENCOUNTER — Observation Stay (HOSPITAL_BASED_OUTPATIENT_CLINIC_OR_DEPARTMENT_OTHER)

## 2023-05-16 DIAGNOSIS — Z86711 Personal history of pulmonary embolism: Secondary | ICD-10-CM

## 2023-05-16 DIAGNOSIS — I2609 Other pulmonary embolism with acute cor pulmonale: Secondary | ICD-10-CM

## 2023-05-16 DIAGNOSIS — I2699 Other pulmonary embolism without acute cor pulmonale: Secondary | ICD-10-CM | POA: Diagnosis not present

## 2023-05-16 DIAGNOSIS — Z86718 Personal history of other venous thrombosis and embolism: Secondary | ICD-10-CM | POA: Diagnosis not present

## 2023-05-16 DIAGNOSIS — J3489 Other specified disorders of nose and nasal sinuses: Secondary | ICD-10-CM | POA: Diagnosis not present

## 2023-05-16 DIAGNOSIS — I10 Essential (primary) hypertension: Secondary | ICD-10-CM | POA: Diagnosis not present

## 2023-05-16 LAB — CBC WITH DIFFERENTIAL/PLATELET
Abs Immature Granulocytes: 0.03 10*3/uL (ref 0.00–0.07)
Basophils Absolute: 0.1 10*3/uL (ref 0.0–0.1)
Basophils Relative: 1 %
Eosinophils Absolute: 0.1 10*3/uL (ref 0.0–0.5)
Eosinophils Relative: 1 %
HCT: 41.2 % (ref 39.0–52.0)
Hemoglobin: 13.7 g/dL (ref 13.0–17.0)
Immature Granulocytes: 0 %
Lymphocytes Relative: 46 %
Lymphs Abs: 3.5 10*3/uL (ref 0.7–4.0)
MCH: 28 pg (ref 26.0–34.0)
MCHC: 33.3 g/dL (ref 30.0–36.0)
MCV: 84.3 fL (ref 80.0–100.0)
Monocytes Absolute: 0.6 10*3/uL (ref 0.1–1.0)
Monocytes Relative: 7 %
Neutro Abs: 3.5 10*3/uL (ref 1.7–7.7)
Neutrophils Relative %: 45 %
Platelets: 260 10*3/uL (ref 150–400)
RBC: 4.89 MIL/uL (ref 4.22–5.81)
RDW: 14.3 % (ref 11.5–15.5)
WBC: 7.8 10*3/uL (ref 4.0–10.5)
nRBC: 0 % (ref 0.0–0.2)

## 2023-05-16 LAB — COMPREHENSIVE METABOLIC PANEL
ALT: 16 U/L (ref 0–44)
AST: 12 U/L — ABNORMAL LOW (ref 15–41)
Albumin: 2.9 g/dL — ABNORMAL LOW (ref 3.5–5.0)
Alkaline Phosphatase: 84 U/L (ref 38–126)
Anion gap: 6 (ref 5–15)
BUN: 10 mg/dL (ref 6–20)
CO2: 25 mmol/L (ref 22–32)
Calcium: 8.8 mg/dL — ABNORMAL LOW (ref 8.9–10.3)
Chloride: 103 mmol/L (ref 98–111)
Creatinine, Ser: 0.96 mg/dL (ref 0.61–1.24)
GFR, Estimated: >60 mL/min (ref >60)
Glucose, Bld: 181 mg/dL — ABNORMAL HIGH (ref 70–99)
Potassium: 3.5 mmol/L (ref 3.5–5.1)
Sodium: 134 mmol/L — ABNORMAL LOW (ref 135–145)
Total Bilirubin: 0.7 mg/dL (ref 0.0–1.2)
Total Protein: 7.1 g/dL (ref 6.5–8.1)

## 2023-05-16 LAB — HEPARIN LEVEL (UNFRACTIONATED)
Heparin Unfractionated: 0.47 [IU]/mL (ref 0.30–0.70)
Heparin Unfractionated: 0.56 [IU]/mL (ref 0.30–0.70)

## 2023-05-16 LAB — MAGNESIUM
Magnesium: 2.1 mg/dL (ref 1.7–2.4)
Magnesium: 2 mg/dL (ref 1.7–2.4)

## 2023-05-16 LAB — ECHOCARDIOGRAM COMPLETE
Area-P 1/2: 4.4 cm2
Height: 74 in
S' Lateral: 2.1 cm
Weight: 3392 [oz_av]

## 2023-05-16 MED ORDER — METHIMAZOLE 5 MG PO TABS
5.0000 mg | ORAL_TABLET | Freq: Every day | ORAL | Status: DC
  Administered 2023-05-17 – 2023-05-18 (×2): 5 mg via ORAL
  Filled 2023-05-16 (×3): qty 1

## 2023-05-16 MED ORDER — HYDROMORPHONE HCL 1 MG/ML IJ SOLN
0.5000 mg | INTRAMUSCULAR | Status: DC | PRN
  Administered 2023-05-16 – 2023-05-18 (×14): 0.5 mg via INTRAVENOUS
  Filled 2023-05-16: qty 1
  Filled 2023-05-16 (×5): qty 0.5
  Filled 2023-05-16: qty 1
  Filled 2023-05-16 (×8): qty 0.5

## 2023-05-16 MED ORDER — NALOXONE HCL 0.4 MG/ML IJ SOLN: 0.4000 mg | INTRAMUSCULAR | Status: DC | PRN

## 2023-05-16 NOTE — Progress Notes (Signed)
 [facility]                                                                               [name], is a 48 y.o. male, DOB - [birth year], WUJ:[id number] Admit date - 05/15/2023    Outpatient Primary MD for the patient is [name], MD  LOS - 0  days    Brief summary   [name] is a 48 y.o. male with medical history significant for nasal cavity mass, pulmonary embolism previously on Eliquis, essential hypertension, who is admitted to [facility] on 05/15/2023 with acute right-sided pulmonary embolism after presenting from home to [facility] ED complaining of incidental finding of acute pulmonary embolism on outpatient CT scan.      Assessment & Plan    Assessment and Plan:  Acute PE Incidental finding on the CT maxillofacial yesterday.  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  Venous duplex of the lower extremities showed positive dvt in the popliteal vein.  Currently on IV heparin, plan to transition to oral eliquis in am.  Echocardiogram ordered and pending.     H/o nasal cavity mass: Follows up with Dr Bertis Ruddy and ENT with [facility] outpatient.    Hypertension:  Well controlled.      Estimated body mass index is 27.22 kg/m as calculated from the following:   Height as of this encounter: 6\' 2"  (1.88 m).   Weight as of this encounter: 96.2 kg.  Code Status: full code.  DVT Prophylaxis:  heparin   Level of Care: Level of care: Telemetry Family Communication: none at bedside.   Disposition Plan:     Remains inpatient appropriate:  possible d.c in am.   Procedures:  Echo   Consultants:   None.   Antimicrobials:   Anti-infectives (From admission, onward)    None        Medications  Scheduled Meds:  methimazole  5 mg Oral Daily   Continuous Infusions:  heparin 1,700 Units/hr (05/16/23 1513)   PRN Meds:.acetaminophen **OR** acetaminophen, HYDROmorphone (DILAUDID) injection,  melatonin, naLOXone (NARCAN)  injection, ondansetron (ZOFRAN) IV    Subjective:   [name] was seen and examined today.  Feeling the same as yesterday.   Objective:   Vitals:   05/16/23 1300 05/16/23 1330 05/16/23 1400 05/16/23 1439  BP: (!) 124/91 126/78 93/62 (!) 141/95  Pulse: 72 62 64 60  Resp: 16  16 20   Temp:    98.2 F (36.8 C)  TempSrc:    Oral  SpO2: 99% 99% 99% 99%  Weight:      Height:        Intake/Output Summary (Last 24 hours) at 05/16/2023 1553 Last data filed at 05/16/2023 1513 Gross per 24 hour  Intake 420.53 ml  Output --  Net 420.53 ml   Filed Weights   05/15/23 1627  Weight: 96.2 kg     Exam General exam: Appears calm and comfortable  Respiratory system: Clear to auscultation. Respiratory effort normal. Cardiovascular system: S1 & S2 heard, RRR. No JVD,  Gastrointestinal system: Abdomen is nondistended, soft and nontender. Central nervous system:  Alert and oriented. Extremities: Symmetric 5 x 5 power. Skin: No rashes,  Psychiatry:Mood & affect appropriate.    Data Reviewed:  I have personally reviewed following labs and imaging studies   CBC Lab Results  Component Value Date   WBC 7.8 05/16/2023   RBC 4.89 05/16/2023   HGB 13.7 05/16/2023   HCT 41.2 05/16/2023   MCV 84.3 05/16/2023   MCH 28.0 05/16/2023   PLT 260 05/16/2023   MCHC 33.3 05/16/2023   RDW 14.3 05/16/2023   LYMPHSABS 3.5 05/16/2023   MONOABS 0.6 05/16/2023   EOSABS 0.1 05/16/2023   BASOSABS 0.1 05/16/2023     Last metabolic panel Lab Results  Component Value Date   NA 134 (L) 05/16/2023   K 3.5 05/16/2023   CL 103 05/16/2023   CO2 25 05/16/2023   BUN 10 05/16/2023   CREATININE 0.96 05/16/2023   GLUCOSE 181 (H) 05/16/2023   GFRNONAA >60 05/16/2023   GFRAA 91 01/29/2019   CALCIUM 8.8 (L) 05/16/2023   PROT 7.1 05/16/2023   ALBUMIN 2.9 (L) 05/16/2023   LABGLOB 3.7 01/29/2019   AGRATIO 1.2 01/29/2019   BILITOT 0.7 05/16/2023   ALKPHOS 84 05/16/2023    AST 12 (L) 05/16/2023   ALT 16 05/16/2023   ANIONGAP 6 05/16/2023    CBG (last 3)  No results for input(s): "GLUCAP" in the last 72 hours.    Coagulation Profile: No results for input(s): "INR", "PROTIME" in the last 168 hours.   Radiology Studies: ECHOCARDIOGRAM COMPLETE Result Date: 05/16/2023    ECHOCARDIOGRAM REPORT   Patient Name:   [name] Date of Exam: 05/16/2023 Medical Rec #:  [id number]       Height:       74.0 in Accession #:    [id number]      Weight:       212.0 lb Date of Birth:  09/25/1975       BSA:          2.228 m Patient Age:    47 years        BP:           105/65 mmHg Patient Gender: M               HR:           57 bpm. Exam Location:  Inpatient Procedure: 2D Echo, Color Doppler and Cardiac Doppler (Both Spectral and Color            Flow Doppler were utilized during procedure). Indications:    Pulmonary Embolus I26.09  History:        Patient has no prior history of Echocardiogram examinations.                 Risk Factors:Hypertension and Diabetes.  Sonographer:    Webb Laws Referring Phys: [id number] JUSTIN B HOWERTER IMPRESSIONS  1. Septal wall motion consistent with conduction delay.. Left ventricular ejection fraction, by estimation, is 60 to 65%. The left ventricle has normal function. There is mild left ventricular hypertrophy. Left ventricular diastolic parameters are indeterminate.  2. Right ventricular systolic function is low normal. The right ventricular size is normal.  3. The mitral valve is normal in structure. Trivial mitral valve regurgitation.  4. The aortic valve is tricuspid. Aortic valve regurgitation is not visualized.  5. The inferior vena cava is normal in size with greater than 50% respiratory variability, suggesting right atrial pressure of 3 mmHg. FINDINGS  Left Ventricle: Septal wall motion consistent  with conduction delay. Left ventricular ejection fraction, by estimation, is 60 to 65%. The left ventricle has normal function. The left  ventricular internal cavity size was normal in size. There is mild left ventricular hypertrophy. Left ventricular diastolic parameters are indeterminate. Right Ventricle: The right ventricular size is normal. Right vetricular wall thickness was not assessed. Right ventricular systolic function is low normal. Left [facility]: Left atrial size was normal in size. Right [facility]: Right atrial size was normal in size. Pericardium: There is no evidence of pericardial effusion. Mitral Valve: The mitral valve is normal in structure. Trivial mitral valve regurgitation. Tricuspid Valve: The tricuspid valve is normal in structure. Tricuspid valve regurgitation is trivial. Aortic Valve: The aortic valve is tricuspid. Aortic valve regurgitation is not visualized. Pulmonic Valve: The pulmonic valve was normal in structure. Pulmonic valve regurgitation is not visualized. Aorta: The aortic root and ascending aorta are structurally normal, with no evidence of dilitation. Venous: The inferior vena cava is normal in size with greater than 50% respiratory variability, suggesting right atrial pressure of 3 mmHg. IAS/Shunts: No atrial level shunt detected by color flow Doppler.  LEFT VENTRICLE PLAX 2D LVIDd:         3.80 cm   Diastology LVIDs:         2.10 cm   LV e' medial:    6.53 cm/s LV PW:         1.20 cm   LV E/e' medial:  10.4 LV IVS:        1.30 cm   LV e' lateral:   9.03 cm/s LVOT diam:     2.40 cm   LV E/e' lateral: 7.5 LV SV:         83 LV SV Index:   37 LVOT Area:     4.52 cm  RIGHT VENTRICLE            IVC RV Basal diam:  3.90 cm    IVC diam: 1.30 cm RV S prime:     9.03 cm/s TAPSE (M-mode): 2.2 cm LEFT ATRIUM           Index        RIGHT ATRIUM           Index LA diam:      3.40 cm 1.53 cm/m   RA Area:     11.00 cm LA Vol (A2C): 28.1 ml 12.61 ml/m  RA Volume:   17.20 ml  7.72 ml/m LA Vol (A4C): 18.0 ml 8.08 ml/m  AORTIC VALVE LVOT Vmax:   80.80 cm/s LVOT Vmean:  59.200 cm/s LVOT VTI:    0.183 m  AORTA Ao Root diam: 3.40 cm  Ao Asc diam:  3.50 cm MITRAL VALVE MV Area (PHT): 4.40 cm    SHUNTS MV E velocity: 67.80 cm/s  Systemic VTI:  0.18 m MV A velocity: 82.00 cm/s  Systemic Diam: 2.40 cm MV E/A ratio:  0.83 Dietrich Pates MD Electronically signed by Dietrich Pates MD Signature Date/Time: 05/16/2023/12:41:27 PM    Final    VAS [place] LOWER EXTREMITY VENOUS (DVT) Result Date: 05/16/2023  Lower Venous DVT Study Patient Name:  LAVARIUS DOUGHTEN  Date of Exam:   05/16/2023 Medical Rec #: [id number]        Accession #:    [id number] Date of Birth: [birth year]        Patient Gender: M Patient Age:   39 years Exam Location:  [facility] Procedure:      VAS [place] LOWER  EXTREMITY VENOUS (DVT) Referring Phys: Newton Pigg --------------------------------------------------------------------------------  Indications: Pulmonary embolism.  Risk Factors: Confirmed PE. Limitations: Poor ultrasound/tissue interface. Comparison Study: 09/07/2022 - RIGHT:                   - Findings consistent with acute deep vein thrombosis                   involving the right                   distal femoral vein, and right popliteal vein.                   - Findings appear improved from previous examination.                   - No cystic structure found in the popliteal fossa.                    LEFT:                   - No evidence of common femoral vein obstruction.                    11/13/2022 - BILATERAL:                   -No evidence of popliteal cyst, bilaterally.                   RIGHT:                    - Findings consistent with age indeterminate deep vein                   thrombosis                   involving the right popliteal vein.                    Findings appear improved from previous examination.                    LEFT:                    - There is no evidence of deep vein thrombosis in the lower                   extremity. Performing Technologist: Chanda Busing RVT  Examination Guidelines: A complete evaluation includes B-mode imaging, spectral  Doppler, color Doppler, and power Doppler as needed of all accessible portions of each vessel. Bilateral testing is considered an integral part of a complete examination. Limited examinations for reoccurring indications may be performed as noted. The reflux portion of the exam is performed with the patient in reverse Trendelenburg.  +---------+---------------+---------+-----------+----------+--------------+ RIGHT    CompressibilityPhasicitySpontaneityPropertiesThrombus Aging +---------+---------------+---------+-----------+----------+--------------+ CFV      Full           Yes      Yes                                 +---------+---------------+---------+-----------+----------+--------------+ SFJ      Full                                                        +---------+---------------+---------+-----------+----------+--------------+  FV Prox  Full                                                        +---------+---------------+---------+-----------+----------+--------------+ FV Mid   Full                                                        +---------+---------------+---------+-----------+----------+--------------+ FV DistalFull                                                        +---------+---------------+---------+-----------+----------+--------------+ PFV      Full                                                        +---------+---------------+---------+-----------+----------+--------------+ POP      Full           Yes      Yes                                 +---------+---------------+---------+-----------+----------+--------------+ PTV      Full                                                        +---------+---------------+---------+-----------+----------+--------------+ PERO     Full                                                        +---------+---------------+---------+-----------+----------+--------------+    +---------+---------------+---------+-----------+----------+--------------+ LEFT     CompressibilityPhasicitySpontaneityPropertiesThrombus Aging +---------+---------------+---------+-----------+----------+--------------+ CFV      Full           Yes      Yes                                 +---------+---------------+---------+-----------+----------+--------------+ SFJ      Full                                                        +---------+---------------+---------+-----------+----------+--------------+ FV Prox  Full                                                        +---------+---------------+---------+-----------+----------+--------------+  FV Mid   Full                                                        +---------+---------------+---------+-----------+----------+--------------+ FV DistalFull                                                        +---------+---------------+---------+-----------+----------+--------------+ PFV      Full                                                        +---------+---------------+---------+-----------+----------+--------------+ POP      Partial        Yes      Yes                  Acute          +---------+---------------+---------+-----------+----------+--------------+ PTV      Full                                                        +---------+---------------+---------+-----------+----------+--------------+ PERO     Full                                                        +---------+---------------+---------+-----------+----------+--------------+ Gastroc  Full                                                        +---------+---------------+---------+-----------+----------+--------------+    Summary: RIGHT: - There is no evidence of deep vein thrombosis in the lower extremity.  - No cystic structure found in the popliteal fossa.  LEFT: - Findings consistent with acute deep vein  thrombosis involving the left popliteal vein.  - No cystic structure found in the popliteal fossa.  *See table(s) above for measurements and observations.    Preliminary    CT Angio Chest Pulmonary Embolism (PE) W or WO Contrast Addendum Date: 05/16/2023 ADDENDUM REPORT: 05/16/2023 00:21 ADDENDUM: Critical Value/emergent results were called by telephone at the time of interpretation on 05/16/2023 at 12:21 am to provider Newton Pigg , who verbally acknowledged these results. Electronically Signed   By: Minerva Fester M.D.   On: 05/16/2023 00:21   Result Date: 05/16/2023 CLINICAL DATA:  Incidental finding on right lower lobe PE on outpatient CT chest earlier today EXAM: CT ANGIOGRAPHY CHEST WITH CONTRAST TECHNIQUE: Multidetector CT imaging of the chest was performed using the standard protocol during bolus administration of intravenous contrast. Multiplanar CT image reconstructions and MIPs were obtained to evaluate the  vascular anatomy. RADIATION DOSE REDUCTION: This exam was performed according to the departmental dose-optimization program which includes automated exposure control, adjustment of the mA and/or kV according to patient size and/or use of iterative reconstruction technique. CONTRAST:  75mL OMNIPAQUE IOHEXOL 350 MG/ML SOLN COMPARISON:  The referenced CT in which a PE was visualized earlier today is not available for comparison. Comparison is made with coronary CT 08/27/2022. FINDINGS: Cardiovascular: Acute pulmonary embolism in the right main pulmonary artery extending into the right lower lobe lobar and segmental branches. No evidence of right heart strain. RV-LV ratio is 1. No pericardial effusion.  Normal caliber thoracic aorta. Mediastinum/Nodes: Trachea and esophagus are unremarkable. Enlarged thyroid. Borderline enlarged subcarinal lymph node measuring 1.1 cm. Lungs/Pleura: No focal consolidation, pleural effusion, or pneumothorax. Upper Abdomen: No acute abnormality. Musculoskeletal: No  acute fracture. Review of the MIP images confirms the above findings. IMPRESSION: 1. Acute pulmonary embolism in the right main pulmonary artery extending into the right lower lobe lobar and segmental branches. This is similar in extent to CT 08/27/2022. No evidence of right heart strain. 2. Enlarged thyroid. Recommend nonemergent thyroid ultrasound (ref: J Am Coll Radiol. 2015 Feb;12(2): 143-50). This report was not communicated to the ordering physician as the PE was already known. Electronically Signed: By: Minerva Fester M.D. On: 05/16/2023 00:17       Kathlen Mody M.D. [facility] 05/16/2023, 3:53 PM  Available via Epic secure chat 7am-7pm After 7 pm, please refer to night coverage provider listed on amion.

## 2023-05-16 NOTE — Telephone Encounter (Signed)
 Spoke with patient confirming upcoming appointment

## 2023-05-16 NOTE — Progress Notes (Signed)
 Bilateral lower extremity venous duplex has been completed. Preliminary results can be found in CV Proc through chart review.  Results were given to Dr. Blake Divine.  05/16/23 9:14 AM Olen Cordial RVT

## 2023-05-16 NOTE — Progress Notes (Signed)
 PHARMACY - ANTICOAGULATION CONSULT NOTE  Pharmacy Consult for Heparin Indication: acute pulmonary embolus and DVT  Allergies  Allergen Reactions   Doxycycline Other (See Comments)    Possible allergen. Developed Bell's Palsy the day after taking antibiotic.   Penicillins Swelling and Rash    Patient Measurements: Height: 6\' 2"  (188 cm) Weight: 96.2 kg (212 lb) IBW/kg (Calculated) : 82.2 Heparin Dosing Weight: 96.2kg   Vital Signs: Temp: 98.2 F (36.8 C) (03/06 1439) Temp Source: Oral (03/06 1439) BP: 141/95 (03/06 1439) Pulse Rate: 60 (03/06 1439)  Labs: Recent Labs    05/15/23 1655 05/16/23 0518 05/16/23 0846 05/16/23 1455  HGB 15.6 13.7  --   --   HCT 47.3 41.2  --   --   PLT 326 260  --   --   HEPARINUNFRC  --   --  0.47 0.56  CREATININE 0.84 0.96  --   --     Estimated Creatinine Clearance: 109.4 mL/min (by C-G formula based on SCr of 0.96 mg/dL).   Medical History: Past Medical History:  Diagnosis Date   Hypertension 03/12/2001   dx age 59    Pulmonary embolism [facility])    Wears glasses     Assessment: Pt is a 49 y.o. 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 Lower extremity doppler on 3/6 shows acute left lower extremity DVT. Pt is currently on heparin drip for VTE treatment.  Today, 05/16/23: - CBC WNL - First heparin level is therapeutic at 0.47 - Per pt's RN, no bleeding noted   - 14:55 confirmatory heparin level therapeutic at 0.56 with heparin infusing at 1700 units/hr - No bleeding or complications noted  Goal of Therapy:  Heparin level 0.3-0.7 units/ml Monitor platelets by anticoagulation protocol: Yes   Plan:  - Continue heparin drip at 1700 units/hr - Monitor daily heparin level, CBC, signs/symptoms of bleeding   Thank you for allowing pharmacy to be a part of  this patient's care.  Selinda Eon, PharmD, BCPS Clinical Pharmacist [place] 05/16/2023 4:30 PM

## 2023-05-16 NOTE — Progress Notes (Signed)
 PHARMACY - ANTICOAGULATION CONSULT NOTE  Pharmacy Consult for Heparin Indication: acute pulmonary embolus and DVT  Allergies  Allergen Reactions   Doxycycline Other (See Comments)    Possible allergen. Developed Bell's Palsy the day after taking antibiotic.   Penicillins Swelling and Rash    Patient Measurements: Height: 6\' 2"  (188 cm) Weight: 96.2 kg (212 lb) IBW/kg (Calculated) : 82.2 Heparin Dosing Weight: 96.2kg   Vital Signs: Temp: 97.9 F (36.6 C) (03/06 0812) Temp Source: Oral (03/06 0812) BP: 103/64 (03/06 0800) Pulse Rate: 62 (03/06 0800)  Labs: Recent Labs    05/15/23 1655 05/16/23 0518 05/16/23 0846  HGB 15.6 13.7  --   HCT 47.3 41.2  --   PLT 326 260  --   HEPARINUNFRC  --   --  0.47  CREATININE 0.84 0.96  --     Estimated Creatinine Clearance: 109.4 mL/min (by C-G formula based on SCr of 0.96 mg/dL).   Medical History: Past Medical History:  Diagnosis Date   Hypertension 03/12/2001   dx age 12    Pulmonary embolism [facility])    Wears glasses     Assessment: Pt is a 48 y.o. 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 Lower extremity doppler on 3/6 shows acute left lower extremity DVT. Pt is currently on heparin drip for VTE treatment.  Today, 05/16/23: - CBC WNL - First heparin level is therapeutic at 0.47 - Per pt's RN, no bleeding noted   Goal of Therapy:  Heparin level 0.3-0.7 units/ml Monitor platelets by anticoagulation protocol: Yes   Plan:  - Continue heparin drip at 1700 units/hr - Recheck heparin level in 6 hours (3pm) to ensure remain therapeutic before changing to daily monitoring - Daily CBC - Monitor for s/sx of bleeding  Tory Emerald, PharmD Candidate 05/16/2023, 9:44 AM

## 2023-05-17 ENCOUNTER — Other Ambulatory Visit (HOSPITAL_COMMUNITY): Payer: Self-pay

## 2023-05-17 DIAGNOSIS — J3489 Other specified disorders of nose and nasal sinuses: Secondary | ICD-10-CM | POA: Diagnosis not present

## 2023-05-17 DIAGNOSIS — I10 Essential (primary) hypertension: Secondary | ICD-10-CM | POA: Diagnosis not present

## 2023-05-17 DIAGNOSIS — Z86718 Personal history of other venous thrombosis and embolism: Secondary | ICD-10-CM | POA: Diagnosis not present

## 2023-05-17 DIAGNOSIS — I2699 Other pulmonary embolism without acute cor pulmonale: Secondary | ICD-10-CM | POA: Diagnosis not present

## 2023-05-17 LAB — CBC
HCT: 40.7 % (ref 39.0–52.0)
Hemoglobin: 13.7 g/dL (ref 13.0–17.0)
MCH: 28.1 pg (ref 26.0–34.0)
MCHC: 33.7 g/dL (ref 30.0–36.0)
MCV: 83.4 fL (ref 80.0–100.0)
Platelets: 259 10*3/uL (ref 150–400)
RBC: 4.88 MIL/uL (ref 4.22–5.81)
RDW: 14.1 % (ref 11.5–15.5)
WBC: 6.8 10*3/uL (ref 4.0–10.5)
nRBC: 0 % (ref 0.0–0.2)

## 2023-05-17 LAB — HEPARIN LEVEL (UNFRACTIONATED): Heparin Unfractionated: 0.5 [IU]/mL (ref 0.30–0.70)

## 2023-05-17 MED ORDER — APIXABAN 5 MG PO TABS: 5.0000 mg | ORAL_TABLET | Freq: Two times a day (BID) | ORAL | Status: DC

## 2023-05-17 MED ORDER — LOSARTAN POTASSIUM 50 MG PO TABS
100.0000 mg | ORAL_TABLET | Freq: Every day | ORAL | Status: DC
  Administered 2023-05-17 – 2023-05-18 (×2): 100 mg via ORAL
  Filled 2023-05-17 (×2): qty 2

## 2023-05-17 MED ORDER — APIXABAN (ELIQUIS) VTE STARTER PACK (10MG AND 5MG)
ORAL_TABLET | ORAL | 0 refills | Status: DC
  Filled 2023-05-17: qty 74, 30d supply, fill #0

## 2023-05-17 MED ORDER — APIXABAN 5 MG PO TABS
10.0000 mg | ORAL_TABLET | Freq: Two times a day (BID) | ORAL | Status: DC
  Administered 2023-05-17 – 2023-05-18 (×3): 10 mg via ORAL
  Filled 2023-05-17 (×3): qty 2

## 2023-05-17 MED ORDER — AMLODIPINE BESYLATE 5 MG PO TABS
10.0000 mg | ORAL_TABLET | Freq: Every day | ORAL | Status: DC
  Administered 2023-05-17 – 2023-05-18 (×2): 10 mg via ORAL
  Filled 2023-05-17 (×2): qty 2

## 2023-05-17 MED ORDER — CARVEDILOL 25 MG PO TABS
25.0000 mg | ORAL_TABLET | Freq: Two times a day (BID) | ORAL | Status: DC
  Administered 2023-05-17 – 2023-05-18 (×2): 25 mg via ORAL
  Filled 2023-05-17 (×2): qty 1

## 2023-05-17 MED ORDER — SPIRONOLACTONE 25 MG PO TABS
50.0000 mg | ORAL_TABLET | Freq: Every day | ORAL | Status: DC
  Administered 2023-05-17 – 2023-05-18 (×2): 50 mg via ORAL
  Filled 2023-05-17 (×2): qty 2

## 2023-05-17 MED ORDER — CARVEDILOL 25 MG PO TABS: 25.0000 mg | ORAL_TABLET | Freq: Two times a day (BID) | ORAL | Status: DC

## 2023-05-17 MED ORDER — METHOCARBAMOL 500 MG PO TABS
750.0000 mg | ORAL_TABLET | Freq: Two times a day (BID) | ORAL | Status: DC | PRN
  Administered 2023-05-17: 750 mg via ORAL
  Filled 2023-05-17: qty 2

## 2023-05-17 NOTE — Discharge Instructions (Addendum)
 Information on my medicine - ELIQUIS (apixaban)  This medication education was reviewed with me or my healthcare representative as part of my discharge preparation.  The pharmacist that spoke with me during my hospital stay was:  Tory Emerald, Student-PharmD  Why was Eliquis prescribed for you? Eliquis was prescribed to treat blood clots that may have been found in the veins of your legs (deep vein thrombosis) or in your lungs (pulmonary embolism) and to reduce the risk of them occurring again.  What do You need to know about Eliquis ? The starting dose is 10 mg (two 5 mg tablets) taken TWICE daily for the FIRST SEVEN (7) DAYS, then on (enter date)  05/24/2023  the dose is reduced to ONE 5 mg tablet taken TWICE daily.  Eliquis may be taken with or without food.   Try to take the dose about the same time in the morning and in the evening. If you have difficulty swallowing the tablet whole please discuss with your pharmacist how to take the medication safely.  Take Eliquis exactly as prescribed and DO NOT stop taking Eliquis without talking to the doctor who prescribed the medication.  Stopping may increase your risk of developing a new blood clot.  Refill your prescription before you run out.  After discharge, you should have regular check-up appointments with your healthcare provider that is prescribing your Eliquis.    What do you do if you miss a dose? If a dose of ELIQUIS is not taken at the scheduled time, take it as soon as possible on the same day and twice-daily administration should be resumed. The dose should not be doubled to make up for a missed dose.  Important Safety Information A possible side effect of Eliquis is bleeding. You should call your healthcare provider right away if you experience any of the following: Bleeding from an injury or your nose that does not stop. Unusual colored urine (red or dark brown) or unusual colored stools (red or black). Unusual bruising  for unknown reasons. A serious fall or if you hit your head (even if there is no bleeding).  Some medicines may interact with Eliquis and might increase your risk of bleeding or clotting while on Eliquis. To help avoid this, consult your healthcare provider or pharmacist prior to using any new prescription or non-prescription medications, including herbals, vitamins, non-steroidal anti-inflammatory drugs (NSAIDs) and supplements.  This website has more information on Eliquis (apixaban): http://www.eliquis.com/eliquis/home

## 2023-05-17 NOTE — Progress Notes (Signed)
 [facility]                                                                               [name], is a 48 y.o. male, DOB - Oct 13, 1975, UJW:[id number] Admit date - 05/15/2023    Outpatient Primary MD for the patient is [name] Matar, MD  LOS - 0  days    Brief summary   [name] is a 48 y.o. male with medical history significant for nasal cavity mass, pulmonary embolism previously on Eliquis, essential hypertension, who is admitted to [name] on 05/15/2023 with acute right-sided pulmonary embolism after presenting from home to [facility] ED complaining of incidental finding of acute pulmonary embolism on outpatient CT scan.      Assessment & Plan    Assessment and Plan:  Acute PE Incidental finding on the CT maxillofacial yesterday.  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  Venous duplex of the lower extremities showed positive dvt in the popliteal vein.  Currently on IV heparin, transitioned to eliquis today.  Echocardiogram showed preserved LVEF.     H/o nasal cavity mass: Follows up with Dr [name] and ENT with [facility] outpatient.  On surveillance.    Hypertension:  Not well controlled.  Restart home medications.      Estimated body mass index is 27.22 kg/m as calculated from the following:   Height as of this encounter: 6\' 2"  (1.88 m).   Weight as of this encounter: 96.2 kg.  Code Status: full code.  DVT Prophylaxis:  heparin apixaban (ELIQUIS) tablet 10 mg  apixaban (ELIQUIS) tablet 5 mg   Level of Care: Level of care: Telemetry Family Communication: none at bedside.   Disposition Plan:     Remains inpatient appropriate:  possible d.c in am.   Procedures:  Echo   Consultants:   None.   Antimicrobials:   Anti-infectives (From admission, onward)    None        Medications  Scheduled Meds:  amLODipine  10 mg Oral Daily   apixaban  10 mg Oral BID   Followed by    Melene Muller ON 05/24/2023] apixaban  5 mg Oral BID   carvedilol  25 mg Oral BID WC   losartan  100 mg Oral Daily   methimazole  5 mg Oral Daily   spironolactone  50 mg Oral Daily   Continuous Infusions:   PRN Meds:.acetaminophen **OR** acetaminophen, HYDROmorphone (DILAUDID) injection, melatonin, methocarbamol, naLOXone (NARCAN)  injection, ondansetron (ZOFRAN) IV    Subjective:   [name] was seen and examined today. Reports having pain in the leg.   Objective:   Vitals:   05/16/23 1439 05/16/23 1829 05/17/23 0007 05/17/23 1424  BP: (!) 141/95 (!) 138/91 (!) 130/95 (!) 155/118  Pulse: 60 (!) 58 61 66  Resp: 20 14 19 20   Temp: 98.2 F (36.8 C) 98.1 F (36.7 C) 98 F (36.7 C) 98 F (36.7 C)  TempSrc: Oral Oral Oral Oral  SpO2: 99% 100% 99% 99%  Weight:      Height:        Intake/Output Summary (Last 24  hours) at 05/17/2023 1514 Last data filed at 05/17/2023 1230 Gross per 24 hour  Intake 410.42 ml  Output 725 ml  Net -314.58 ml   Filed Weights   05/15/23 1627  Weight: 96.2 kg     Exam General exam: Appears calm and comfortable  Respiratory system: Clear to auscultation. Respiratory effort normal. Cardiovascular system: S1 & S2 heard, RRR. No JVD, Gastrointestinal system: Abdomen is nondistended, soft and nontender. Central nervous system: Alert and oriented. No focal neurological deficits. Extremities: Symmetric 5 x 5 power. Skin: No rashes, lesions or ulcers Psychiatry: Mood & affect appropriate.     Data Reviewed:  I have personally reviewed following labs and imaging studies   CBC Lab Results  Component Value Date   WBC 6.8 05/17/2023   RBC 4.88 05/17/2023   HGB 13.7 05/17/2023   HCT 40.7 05/17/2023   MCV 83.4 05/17/2023   MCH 28.1 05/17/2023   PLT 259 05/17/2023   MCHC 33.7 05/17/2023   RDW 14.1 05/17/2023   LYMPHSABS 3.5 05/16/2023   MONOABS 0.6 05/16/2023   EOSABS 0.1 05/16/2023   BASOSABS 0.1 05/16/2023     Last metabolic panel Lab  Results  Component Value Date   NA 134 (L) 05/16/2023   K 3.5 05/16/2023   CL 103 05/16/2023   CO2 25 05/16/2023   BUN 10 05/16/2023   CREATININE 0.96 05/16/2023   GLUCOSE 181 (H) 05/16/2023   GFRNONAA >60 05/16/2023   GFRAA 91 01/29/2019   CALCIUM 8.8 (L) 05/16/2023   PROT 7.1 05/16/2023   ALBUMIN 2.9 (L) 05/16/2023   LABGLOB 3.7 01/29/2019   AGRATIO 1.2 01/29/2019   BILITOT 0.7 05/16/2023   ALKPHOS 84 05/16/2023   AST 12 (L) 05/16/2023   ALT 16 05/16/2023   ANIONGAP 6 05/16/2023    CBG (last 3)  No results for input(s): "GLUCAP" in the last 72 hours.    Coagulation Profile: No results for input(s): "INR", "PROTIME" in the last 168 hours.   Radiology Studies: VAS [place] LOWER EXTREMITY VENOUS (DVT) Result Date: 05/16/2023  Lower Venous DVT Study Patient Name:  [name]  Date of Exam:   05/16/2023 Medical Rec #: [id number]        Accession #:    [id number] Date of Birth: 01/16/1976        Patient Gender: M Patient Age:   79 years Exam Location:  [facility] Procedure:      VAS [place] LOWER EXTREMITY VENOUS (DVT) Referring Phys: [name] --------------------------------------------------------------------------------  Indications: Pulmonary embolism.  Risk Factors: Confirmed PE. Limitations: Poor ultrasound/tissue interface. Comparison Study: 09/07/2022 - RIGHT:                   - Findings consistent with acute deep vein thrombosis                   involving the right                   distal femoral vein, and right popliteal vein.                   - Findings appear improved from previous examination.                   - No cystic structure found in the popliteal fossa.                    LEFT:                   -  No evidence of common femoral vein obstruction.                    11/13/2022 - BILATERAL:                   -No evidence of popliteal cyst, bilaterally.                   RIGHT:                    - Findings consistent with age indeterminate deep vein                    thrombosis                   involving the right popliteal vein.                    Findings appear improved from previous examination.                    LEFT:                    - There is no evidence of deep vein thrombosis in the lower                   extremity. Performing Technologist: Chanda Busing RVT  Examination Guidelines: A complete evaluation includes B-mode imaging, spectral Doppler, color Doppler, and power Doppler as needed of all accessible portions of each vessel. Bilateral testing is considered an integral part of a complete examination. Limited examinations for reoccurring indications may be performed as noted. The reflux portion of the exam is performed with the patient in reverse Trendelenburg.  +---------+---------------+---------+-----------+----------+--------------+ RIGHT    CompressibilityPhasicitySpontaneityPropertiesThrombus Aging +---------+---------------+---------+-----------+----------+--------------+ CFV      Full           Yes      Yes                                 +---------+---------------+---------+-----------+----------+--------------+ SFJ      Full                                                        +---------+---------------+---------+-----------+----------+--------------+ FV Prox  Full                                                        +---------+---------------+---------+-----------+----------+--------------+ FV Mid   Full                                                        +---------+---------------+---------+-----------+----------+--------------+ FV DistalFull                                                        +---------+---------------+---------+-----------+----------+--------------+  PFV      Full                                                        +---------+---------------+---------+-----------+----------+--------------+ POP      Full           Yes      Yes                                  +---------+---------------+---------+-----------+----------+--------------+ PTV      Full                                                        +---------+---------------+---------+-----------+----------+--------------+ PERO     Full                                                        +---------+---------------+---------+-----------+----------+--------------+   +---------+---------------+---------+-----------+----------+--------------+ LEFT     CompressibilityPhasicitySpontaneityPropertiesThrombus Aging +---------+---------------+---------+-----------+----------+--------------+ CFV      Full           Yes      Yes                                 +---------+---------------+---------+-----------+----------+--------------+ SFJ      Full                                                        +---------+---------------+---------+-----------+----------+--------------+ FV Prox  Full                                                        +---------+---------------+---------+-----------+----------+--------------+ FV Mid   Full                                                        +---------+---------------+---------+-----------+----------+--------------+ FV DistalFull                                                        +---------+---------------+---------+-----------+----------+--------------+ PFV      Full                                                        +---------+---------------+---------+-----------+----------+--------------+  POP      Partial        Yes      Yes                  Acute          +---------+---------------+---------+-----------+----------+--------------+ PTV      Full                                                        +---------+---------------+---------+-----------+----------+--------------+ PERO     Full                                                         +---------+---------------+---------+-----------+----------+--------------+ Gastroc  Full                                                        +---------+---------------+---------+-----------+----------+--------------+     Summary: RIGHT: - There is no evidence of deep vein thrombosis in the lower extremity.  - No cystic structure found in the popliteal fossa.  LEFT: - Findings consistent with acute deep vein thrombosis involving the left popliteal vein.  - No cystic structure found in the popliteal fossa.  *See table(s) above for measurements and observations. Electronically signed by Heath Lark on 05/16/2023 at 7:53:59 PM.    Final    ECHOCARDIOGRAM COMPLETE Result Date: 05/16/2023    ECHOCARDIOGRAM REPORT   Patient Name:   [name] Date of Exam: 05/16/2023 Medical Rec #:  [id number]       Height:       74.0 in Accession #:    [id number]      Weight:       212.0 lb Date of Birth:  [birth year]       BSA:          2.228 m Patient Age:    47 years        BP:           105/65 mmHg Patient Gender: M               HR:           57 bpm. Exam Location:  Inpatient Procedure: 2D Echo, Color Doppler and Cardiac Doppler (Both Spectral and Color            Flow Doppler were utilized during procedure). Indications:    Pulmonary Embolus I26.09  History:        Patient has no prior history of Echocardiogram examinations.                 Risk Factors:Hypertension and Diabetes.  Sonographer:    Webb Laws Referring Phys: [id number] JUSTIN B HOWERTER IMPRESSIONS  1. Septal wall motion consistent with conduction delay.. Left ventricular ejection fraction, by estimation, is 60 to 65%. The left ventricle has normal function. There is mild left ventricular hypertrophy. Left ventricular diastolic parameters are indeterminate.  2. Right ventricular systolic function is low normal. The right ventricular size is normal.  3. The mitral valve is normal in structure. Trivial mitral valve regurgitation.  4. The aortic valve is  tricuspid. Aortic valve regurgitation is not visualized.  5. The inferior vena cava is normal in size with greater than 50% respiratory variability, suggesting right atrial pressure of 3 mmHg. FINDINGS  Left Ventricle: Septal wall motion consistent with conduction delay. Left ventricular ejection fraction, by estimation, is 60 to 65%. The left ventricle has normal function. The left ventricular internal cavity size was normal in size. There is mild left ventricular hypertrophy. Left ventricular diastolic parameters are indeterminate. Right Ventricle: The right ventricular size is normal. Right vetricular wall thickness was not assessed. Right ventricular systolic function is low normal. Left [facility]: Left atrial size was normal in size. Right [facility]: Right atrial size was normal in size. Pericardium: There is no evidence of pericardial effusion. Mitral Valve: The mitral valve is normal in structure. Trivial mitral valve regurgitation. Tricuspid Valve: The tricuspid valve is normal in structure. Tricuspid valve regurgitation is trivial. Aortic Valve: The aortic valve is tricuspid. Aortic valve regurgitation is not visualized. Pulmonic Valve: The pulmonic valve was normal in structure. Pulmonic valve regurgitation is not visualized. Aorta: The aortic root and ascending aorta are structurally normal, with no evidence of dilitation. Venous: The inferior vena cava is normal in size with greater than 50% respiratory variability, suggesting right atrial pressure of 3 mmHg. IAS/Shunts: No atrial level shunt detected by color flow Doppler.  LEFT VENTRICLE PLAX 2D LVIDd:         3.80 cm   Diastology LVIDs:         2.10 cm   LV e' medial:    6.53 cm/s LV PW:         1.20 cm   LV E/e' medial:  10.4 LV IVS:        1.30 cm   LV e' lateral:   9.03 cm/s LVOT diam:     2.40 cm   LV E/e' lateral: 7.5 LV SV:         83 LV SV Index:   37 LVOT Area:     4.52 cm  RIGHT VENTRICLE            IVC RV Basal diam:  3.90 cm    IVC diam: 1.30 cm  RV S prime:     9.03 cm/s TAPSE (M-mode): 2.2 cm LEFT ATRIUM           Index        RIGHT ATRIUM           Index LA diam:      3.40 cm 1.53 cm/m   RA Area:     11.00 cm LA Vol (A2C): 28.1 ml 12.61 ml/m  RA Volume:   17.20 ml  7.72 ml/m LA Vol (A4C): 18.0 ml 8.08 ml/m  AORTIC VALVE LVOT Vmax:   80.80 cm/s LVOT Vmean:  59.200 cm/s LVOT VTI:    0.183 m  AORTA Ao Root diam: 3.40 cm Ao Asc diam:  3.50 cm MITRAL VALVE MV Area (PHT): 4.40 cm    SHUNTS MV E velocity: 67.80 cm/s  Systemic VTI:  0.18 m MV A velocity: 82.00 cm/s  Systemic Diam: 2.40 cm MV E/A ratio:  0.83 Dietrich Pates MD Electronically signed by Dietrich Pates MD Signature Date/Time: 05/16/2023/12:41:27 PM    Final    CT Angio Chest Pulmonary Embolism (PE) W or WO Contrast Addendum Date: 05/16/2023 ADDENDUM REPORT: 05/16/2023 00:21 ADDENDUM: Critical Value/emergent results were called  by telephone at the time of interpretation on 05/16/2023 at 12:21 am to provider [name] , who verbally acknowledged these results. Electronically Signed   By: Minerva Fester M.D.   On: 05/16/2023 00:21   Result Date: 05/16/2023 CLINICAL DATA:  Incidental finding on right lower lobe PE on outpatient CT chest earlier today EXAM: CT ANGIOGRAPHY CHEST WITH CONTRAST TECHNIQUE: Multidetector CT imaging of the chest was performed using the standard protocol during bolus administration of intravenous contrast. Multiplanar CT image reconstructions and MIPs were obtained to evaluate the vascular anatomy. RADIATION DOSE REDUCTION: This exam was performed according to the departmental dose-optimization program which includes automated exposure control, adjustment of the mA and/or kV according to patient size and/or use of iterative reconstruction technique. CONTRAST:  75mL OMNIPAQUE IOHEXOL 350 MG/ML SOLN COMPARISON:  The referenced CT in which a PE was visualized earlier today is not available for comparison. Comparison is made with coronary CT 08/27/2022. FINDINGS: Cardiovascular:  Acute pulmonary embolism in the right main pulmonary artery extending into the right lower lobe lobar and segmental branches. No evidence of right heart strain. RV-LV ratio is 1. No pericardial effusion.  Normal caliber thoracic aorta. Mediastinum/Nodes: Trachea and esophagus are unremarkable. Enlarged thyroid. Borderline enlarged subcarinal lymph node measuring 1.1 cm. Lungs/Pleura: No focal consolidation, pleural effusion, or pneumothorax. Upper Abdomen: No acute abnormality. Musculoskeletal: No acute fracture. Review of the MIP images confirms the above findings. IMPRESSION: 1. Acute pulmonary embolism in the right main pulmonary artery extending into the right lower lobe lobar and segmental branches. This is similar in extent to CT 08/27/2022. No evidence of right heart strain. 2. Enlarged thyroid. Recommend nonemergent thyroid ultrasound (ref: J Am Coll Radiol. 2015 Feb;12(2): 143-50). This report was not communicated to the ordering physician as the PE was already known. Electronically Signed: By: Minerva Fester M.D. On: 05/16/2023 00:17       Kathlen Mody M.D. [facility] 05/17/2023, 3:14 PM  Available via Epic secure chat 7am-7pm After 7 pm, please refer to night coverage provider listed on amion.

## 2023-05-17 NOTE — Progress Notes (Signed)
 [name]   DOB:Sep 28, 1975   UX#:[id number]      ASSESSMENT & PLAN:  Acute pulmonary embolism History of DVT/pulmonary embolism -Patient admitted on 05/15/2023 with incidental finding of acute PE on outpatient imaging. - Previously on Eliquis.,  Completed December 2024.  Since then has not been on any anticoagulants. -CT angio chest done 05/15/2023 shows acute PE in right main pulmonary artery extending into RLL - IV heparin infusing during time of examination and assessment. However being discontinued and patient starting on Eliquis loading dose today.  -Heme-onc/Dr. 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   - Patient reports it was malignant but that chemo was not done as it was "slow-growing".  Documentation is not noted in care everywhere.   - Patient reports he is on active surveillance with imaging done regularly. -CT head done 03/19/2023 showed enlarged palate teen tonsils compatible with tonsillopharyngitis.  No acute findings on CT abdomen pelvis. - Follows with oncology/Dr. Lin [name] at [facility]. -Follows with ENT Dr. Ishmael [name] at [facility].  Hypertension Prediabetes - Continue close follow-up with PCP - Continue to monitor blood glucose levels - Continue to monitor blood pressure    Code Status Full  Subjective:  Patient seen awake and alert laying in bed. IV heparin being turned off and patient taking Eliquis.  Reports he feels well and did not experience shortness of breath or other acute symptoms; that he only came because his doctor called him regarding PE seen on imaging.  No acute distress noted at this time.   Objective:  Vitals:   05/16/23 1829 05/17/23 0007  BP: (!) 138/91 (!) 130/95  Pulse: (!) 58 61  Resp: 14 19  Temp: 98.1 F (36.7 C) 98 F (36.7 C)  SpO2: 100% 99%     Intake/Output Summary (Last 24 hours) at 05/17/2023 1050 Last data  filed at 05/17/2023 2536 Gross per 24 hour  Intake 470.95 ml  Output 425 ml  Net 45.95 ml     REVIEW OF SYSTEMS:   Constitutional: Denies fevers, chills or abnormal night sweats Eyes: Denies blurriness of vision, double vision or watery eyes Ears, nose, mouth, throat, and face: Denies mucositis or sore throat Respiratory: Denies cough, dyspnea or wheezes Cardiovascular: Denies palpitation, chest discomfort or lower extremity swelling Gastrointestinal:  Denies nausea, heartburn or change in bowel habits Skin: Denies abnormal skin rashes Lymphatics: Denies new lymphadenopathy or easy bruising Neurological: Denies numbness, tingling or new weaknesses Behavioral/Psych: Mood is stable, no new changes  All other systems were reviewed with the patient and are negative.  PHYSICAL EXAMINATION: ECOG PERFORMANCE STATUS: 1 - Symptomatic but completely ambulatory  Vitals:   05/16/23 1829 05/17/23 0007  BP: (!) 138/91 (!) 130/95  Pulse: (!) 58 61  Resp: 14 19  Temp: 98.1 F (36.7 C) 98 F (36.7 C)  SpO2: 100% 99%   Filed Weights   05/15/23 1627  Weight: 212 lb (96.2 kg)    GENERAL: alert, no distress and comfortable SKIN: skin color, texture, turgor are normal, no rashes or significant lesions EYES: normal, conjunctiva are pink and non-injected, sclera clear OROPHARYNX: no exudate, no erythema and lips, buccal mucosa, and tongue normal  NECK: supple, thyroid normal size, non-tender, without nodularity LYMPH: no palpable lymphadenopathy in the cervical, axillary or inguinal LUNGS: clear to auscultation and percussion with normal breathing effort HEART: regular rate & rhythm and no murmurs and no lower  extremity edema ABDOMEN: abdomen soft, non-tender and normal bowel sounds MUSCULOSKELETAL: no cyanosis of digits and no clubbing  PSYCH: alert & oriented x 3 with fluent speech NEURO: no focal motor/sensory deficits   All questions were answered. The patient knows to call the clinic  with any problems, questions or concerns.   The total time spent in the appointment was 40 minutes encounter with patient including review of chart and various tests results, discussions about plan of care and coordination of care plan  [name] Bills, NP 05/17/2023 10:50 AM    Labs Reviewed:  Lab Results  Component Value Date   WBC 6.8 05/17/2023   HGB 13.7 05/17/2023   HCT 40.7 05/17/2023   MCV 83.4 05/17/2023   PLT 259 05/17/2023   Recent Labs    03/29/23 1927 05/15/23 1655 05/16/23 0518  NA 134* 136 134*  K 4.1 3.9 3.5  CL 98 101 103  CO2 25 26 25   GLUCOSE 111* 120* 181*  BUN 10 8 10   CREATININE 0.89 0.84 0.96  CALCIUM 9.3 9.4 8.8*  GFRNONAA >60 >60 >60  PROT 8.5* 8.6* 7.1  ALBUMIN 3.6 3.5 2.9*  AST 13* 15 12*  ALT 15 17 16   ALKPHOS 99 102 84  BILITOT 0.2 0.4 0.7    Studies Reviewed:  VAS [place] LOWER EXTREMITY VENOUS (DVT) Result Date: 05/16/2023  Lower Venous DVT Study Patient Name:  [name]  Date of Exam:   05/16/2023 Medical Rec #: [id number]        Accession #:    [id number] Date of Birth: Jul 02, 1975        Patient Gender: M Patient Age:   48 years Exam Location:  [facility] Procedure:      VAS [place] LOWER EXTREMITY VENOUS (DVT) Referring Phys: Newton Pigg --------------------------------------------------------------------------------  Indications: Pulmonary embolism.  Risk Factors: Confirmed PE. Limitations: Poor ultrasound/tissue interface. Comparison Study: 09/07/2022 - RIGHT:                   - Findings consistent with acute deep vein thrombosis                   involving the right                   distal femoral vein, and right popliteal vein.                   - Findings appear improved from previous examination.                   - No cystic structure found in the popliteal fossa.                    LEFT:                   - No evidence of common femoral vein obstruction.                    11/13/2022 - BILATERAL:                   -No evidence of  popliteal cyst, bilaterally.                   RIGHT:                    - Findings consistent with age indeterminate deep vein  thrombosis                   involving the right popliteal vein.                    Findings appear improved from previous examination.                    LEFT:                    - There is no evidence of deep vein thrombosis in the lower                   extremity. Performing Technologist: Chanda Busing RVT  Examination Guidelines: A complete evaluation includes B-mode imaging, spectral Doppler, color Doppler, and power Doppler as needed of all accessible portions of each vessel. Bilateral testing is considered an integral part of a complete examination. Limited examinations for reoccurring indications may be performed as noted. The reflux portion of the exam is performed with the patient in reverse Trendelenburg.  +---------+---------------+---------+-----------+----------+--------------+ RIGHT    CompressibilityPhasicitySpontaneityPropertiesThrombus Aging +---------+---------------+---------+-----------+----------+--------------+ CFV      Full           Yes      Yes                                 +---------+---------------+---------+-----------+----------+--------------+ SFJ      Full                                                        +---------+---------------+---------+-----------+----------+--------------+ FV Prox  Full                                                        +---------+---------------+---------+-----------+----------+--------------+ FV Mid   Full                                                        +---------+---------------+---------+-----------+----------+--------------+ FV DistalFull                                                        +---------+---------------+---------+-----------+----------+--------------+ PFV      Full                                                         +---------+---------------+---------+-----------+----------+--------------+ POP      Full           Yes      Yes                                 +---------+---------------+---------+-----------+----------+--------------+  PTV      Full                                                        +---------+---------------+---------+-----------+----------+--------------+ PERO     Full                                                        +---------+---------------+---------+-----------+----------+--------------+   +---------+---------------+---------+-----------+----------+--------------+ LEFT     CompressibilityPhasicitySpontaneityPropertiesThrombus Aging +---------+---------------+---------+-----------+----------+--------------+ CFV      Full           Yes      Yes                                 +---------+---------------+---------+-----------+----------+--------------+ SFJ      Full                                                        +---------+---------------+---------+-----------+----------+--------------+ FV Prox  Full                                                        +---------+---------------+---------+-----------+----------+--------------+ FV Mid   Full                                                        +---------+---------------+---------+-----------+----------+--------------+ FV DistalFull                                                        +---------+---------------+---------+-----------+----------+--------------+ PFV      Full                                                        +---------+---------------+---------+-----------+----------+--------------+ POP      Partial        Yes      Yes                  Acute          +---------+---------------+---------+-----------+----------+--------------+ PTV      Full                                                         +---------+---------------+---------+-----------+----------+--------------+  PERO     Full                                                        +---------+---------------+---------+-----------+----------+--------------+ Gastroc  Full                                                        +---------+---------------+---------+-----------+----------+--------------+     Summary: RIGHT: - There is no evidence of deep vein thrombosis in the lower extremity.  - No cystic structure found in the popliteal fossa.  LEFT: - Findings consistent with acute deep vein thrombosis involving the left popliteal vein.  - No cystic structure found in the popliteal fossa.  *See table(s) above for measurements and observations. Electronically signed by Heath Lark on 05/16/2023 at 7:53:59 PM.    Final    ECHOCARDIOGRAM COMPLETE Result Date: 05/16/2023    ECHOCARDIOGRAM REPORT   Patient Name:   GRAVIEL PAYEUR Date of Exam: 05/16/2023 Medical Rec #:  [id number]       Height:       74.0 in Accession #:    [id number]      Weight:       212.0 lb Date of Birth:  [birth year]       BSA:          2.228 m Patient Age:    47 years        BP:           105/65 mmHg Patient Gender: M               HR:           57 bpm. Exam Location:  Inpatient Procedure: 2D Echo, Color Doppler and Cardiac Doppler (Both Spectral and Color            Flow Doppler were utilized during procedure). Indications:    Pulmonary Embolus I26.09  History:        Patient has no prior history of Echocardiogram examinations.                 Risk Factors:Hypertension and Diabetes.  Sonographer:    Webb Laws Referring Phys: [id number] JUSTIN B HOWERTER IMPRESSIONS  1. Septal wall motion consistent with conduction delay.. Left ventricular ejection fraction, by estimation, is 60 to 65%. The left ventricle has normal function. There is mild left ventricular hypertrophy. Left ventricular diastolic parameters are indeterminate.  2. Right ventricular systolic function is  low normal. The right ventricular size is normal.  3. The mitral valve is normal in structure. Trivial mitral valve regurgitation.  4. The aortic valve is tricuspid. Aortic valve regurgitation is not visualized.  5. The inferior vena cava is normal in size with greater than 50% respiratory variability, suggesting right atrial pressure of 3 mmHg. FINDINGS  Left Ventricle: Septal wall motion consistent with conduction delay. Left ventricular ejection fraction, by estimation, is 60 to 65%. The left ventricle has normal function. The left ventricular internal cavity size was normal in size. There is mild left ventricular hypertrophy. Left ventricular diastolic parameters are indeterminate. Right Ventricle: The right ventricular size is normal. Right vetricular wall thickness was not assessed.  Right ventricular systolic function is low normal. Left [facility]: Left atrial size was normal in size. Right [facility]: Right atrial size was normal in size. Pericardium: There is no evidence of pericardial effusion. Mitral Valve: The mitral valve is normal in structure. Trivial mitral valve regurgitation. Tricuspid Valve: The tricuspid valve is normal in structure. Tricuspid valve regurgitation is trivial. Aortic Valve: The aortic valve is tricuspid. Aortic valve regurgitation is not visualized. Pulmonic Valve: The pulmonic valve was normal in structure. Pulmonic valve regurgitation is not visualized. Aorta: The aortic root and ascending aorta are structurally normal, with no evidence of dilitation. Venous: The inferior vena cava is normal in size with greater than 50% respiratory variability, suggesting right atrial pressure of 3 mmHg. IAS/Shunts: No atrial level shunt detected by color flow Doppler.  LEFT VENTRICLE PLAX 2D LVIDd:         3.80 cm   Diastology LVIDs:         2.10 cm   LV e' medial:    6.53 cm/s LV PW:         1.20 cm   LV E/e' medial:  10.4 LV IVS:        1.30 cm   LV e' lateral:   9.03 cm/s LVOT diam:     2.40 cm   LV  E/e' lateral: 7.5 LV SV:         83 LV SV Index:   37 LVOT Area:     4.52 cm  RIGHT VENTRICLE            IVC RV Basal diam:  3.90 cm    IVC diam: 1.30 cm RV S prime:     9.03 cm/s TAPSE (M-mode): 2.2 cm LEFT ATRIUM           Index        RIGHT ATRIUM           Index LA diam:      3.40 cm 1.53 cm/m   RA Area:     11.00 cm LA Vol (A2C): 28.1 ml 12.61 ml/m  RA Volume:   17.20 ml  7.72 ml/m LA Vol (A4C): 18.0 ml 8.08 ml/m  AORTIC VALVE LVOT Vmax:   80.80 cm/s LVOT Vmean:  59.200 cm/s LVOT VTI:    0.183 m  AORTA Ao Root diam: 3.40 cm Ao Asc diam:  3.50 cm MITRAL VALVE MV Area (PHT): 4.40 cm    SHUNTS MV E velocity: 67.80 cm/s  Systemic VTI:  0.18 m MV A velocity: 82.00 cm/s  Systemic Diam: 2.40 cm MV E/A ratio:  0.83 Dietrich Pates MD Electronically signed by Dietrich Pates MD Signature Date/Time: 05/16/2023/12:41:27 PM    Final    CT Angio Chest Pulmonary Embolism (PE) W or WO Contrast Addendum Date: 05/16/2023 ADDENDUM REPORT: 05/16/2023 00:21 ADDENDUM: Critical Value/emergent results were called by telephone at the time of interpretation on 05/16/2023 at 12:21 am to provider Newton Pigg , who verbally acknowledged these results. Electronically Signed   By: Minerva Fester M.D.   On: 05/16/2023 00:21   Result Date: 05/16/2023 CLINICAL DATA:  Incidental finding on right lower lobe PE on outpatient CT chest earlier today EXAM: CT ANGIOGRAPHY CHEST WITH CONTRAST TECHNIQUE: Multidetector CT imaging of the chest was performed using the standard protocol during bolus administration of intravenous contrast. Multiplanar CT image reconstructions and MIPs were obtained to evaluate the vascular anatomy. RADIATION DOSE REDUCTION: This exam was performed according to the departmental dose-optimization program which includes automated exposure control,  adjustment of the mA and/or kV according to patient size and/or use of iterative reconstruction technique. CONTRAST:  75mL OMNIPAQUE IOHEXOL 350 MG/ML SOLN COMPARISON:  The  referenced CT in which a PE was visualized earlier today is not available for comparison. Comparison is made with coronary CT 08/27/2022. FINDINGS: Cardiovascular: Acute pulmonary embolism in the right main pulmonary artery extending into the right lower lobe lobar and segmental branches. No evidence of right heart strain. RV-LV ratio is 1. No pericardial effusion.  Normal caliber thoracic aorta. Mediastinum/Nodes: Trachea and esophagus are unremarkable. Enlarged thyroid. Borderline enlarged subcarinal lymph node measuring 1.1 cm. Lungs/Pleura: No focal consolidation, pleural effusion, or pneumothorax. Upper Abdomen: No acute abnormality. Musculoskeletal: No acute fracture. Review of the MIP images confirms the above findings. IMPRESSION: 1. Acute pulmonary embolism in the right main pulmonary artery extending into the right lower lobe lobar and segmental branches. This is similar in extent to CT 08/27/2022. No evidence of right heart strain. 2. Enlarged thyroid. Recommend nonemergent thyroid ultrasound (ref: J Am Coll Radiol. 2015 Feb;12(2): 143-50). This report was not communicated to the ordering physician as the PE was already known. Electronically Signed: By: Minerva Fester M.D. On: 05/16/2023 00:17

## 2023-05-17 NOTE — Progress Notes (Signed)
   05/17/23 0911  TOC Brief Assessment  Insurance and Status Reviewed  Patient has primary care physician Yes  Home environment has been reviewed Resides in single family home with relatives  Prior level of function: Independent at baseline with ADLs  Prior/Current Home Services No current home services  Social Drivers of Health Review SDOH reviewed no interventions necessary  Readmission risk has been reviewed Yes  Transition of care needs no transition of care needs at this time

## 2023-05-17 NOTE — Progress Notes (Addendum)
 PHARMACY - ANTICOAGULATION CONSULT NOTE  Pharmacy Consult for Heparin>>Eliquis Indication:  DVT and PE  Allergies  Allergen Reactions   Doxycycline Other (See Comments)    Possible allergen. Developed Bell's Palsy the day after taking antibiotic.   Penicillins Swelling and Rash    Patient Measurements: Height: 6\' 2"  (188 cm) Weight: 96.2 kg (212 lb) IBW/kg (Calculated) : 82.2 Heparin Dosing Weight: 96.2 kg  Vital Signs: Temp: 98 F (36.7 C) (03/07 0007) Temp Source: Oral (03/07 0007) BP: 130/95 (03/07 0007) Pulse Rate: 61 (03/07 0007)  Labs: Recent Labs    05/15/23 1655 05/16/23 0518 05/16/23 0846 05/16/23 1455 05/17/23 0453  HGB 15.6 13.7  --   --  13.7  HCT 47.3 41.2  --   --  40.7  PLT 326 260  --   --  259  HEPARINUNFRC  --   --  0.47 0.56 0.50  CREATININE 0.84 0.96  --   --   --     Estimated Creatinine Clearance: 109.4 mL/min (by C-G formula based on SCr of 0.96 mg/dL).   Medical History: Past Medical History:  Diagnosis Date   Hypertension 03/12/2001   dx age 45    Pulmonary embolism (HCC)    Wears glasses     Assessment: AC/Heme: hx "unprovoked" (per heme/onc) DVT and PE  in June 2024, placed on Eliquis --> pt said [facility] at [facility] discontinued the Eliquis in Dec 2024 (after 6 months trxt); now on heparin for new acute PE/DVT. - 3/5 chest CT: Acute + PE in the right main pulmonary arteryextending into the right lower lobe lobar and segmental branches. No RHS - 3/6 LE doppler: acute + LLE DVT  - CBC remains WNL. Hep level 0.5 remains in goal.  Goal of Therapy:  Heparin level 0.3-0.7 units/ml Monitor platelets by anticoagulation protocol: Yes   Plan:  Con't IV heparin at 1700 units/hr>>d/c Daily HL and CBC Plan to re-start DOAC today at Eliquis 10mg  BID x 7d then 5mg  BID   Chaley Castellanos S. Merilynn Finland, PharmD, BCPS Clinical Staff Pharmacist [email or web address]  Merilynn Finland, Orean Giarratano Stillinger 05/17/2023,7:09 AM

## 2023-05-18 ENCOUNTER — Other Ambulatory Visit (HOSPITAL_COMMUNITY): Payer: Self-pay

## 2023-05-18 DIAGNOSIS — I2699 Other pulmonary embolism without acute cor pulmonale: Secondary | ICD-10-CM | POA: Diagnosis not present

## 2023-05-18 DIAGNOSIS — J3489 Other specified disorders of nose and nasal sinuses: Secondary | ICD-10-CM | POA: Diagnosis not present

## 2023-05-18 DIAGNOSIS — I10 Essential (primary) hypertension: Secondary | ICD-10-CM | POA: Diagnosis not present

## 2023-05-18 DIAGNOSIS — Z86718 Personal history of other venous thrombosis and embolism: Secondary | ICD-10-CM | POA: Diagnosis not present

## 2023-05-18 MED ORDER — POLYETHYLENE GLYCOL 3350 17 GM/SCOOP PO POWD
17.0000 g | Freq: Every day | ORAL | 0 refills | Status: DC
  Filled 2023-05-18: qty 238, 14d supply, fill #0

## 2023-05-18 MED ORDER — POLYETHYLENE GLYCOL 3350 17 G PO PACK
17.0000 g | PACK | Freq: Every day | ORAL | Status: DC
  Filled 2023-05-18: qty 1

## 2023-05-18 NOTE — Plan of Care (Signed)

## 2023-05-19 ENCOUNTER — Other Ambulatory Visit: Payer: Self-pay | Admitting: Nurse Practitioner

## 2023-05-19 DIAGNOSIS — N529 Male erectile dysfunction, unspecified: Secondary | ICD-10-CM

## 2023-05-20 ENCOUNTER — Other Ambulatory Visit: Payer: Self-pay

## 2023-05-20 ENCOUNTER — Telehealth: Payer: Self-pay

## 2023-05-20 DIAGNOSIS — N400 Enlarged prostate without lower urinary tract symptoms: Secondary | ICD-10-CM | POA: Diagnosis not present

## 2023-05-20 DIAGNOSIS — N5201 Erectile dysfunction due to arterial insufficiency: Secondary | ICD-10-CM | POA: Diagnosis not present

## 2023-05-20 DIAGNOSIS — R310 Gross hematuria: Secondary | ICD-10-CM | POA: Diagnosis not present

## 2023-05-20 NOTE — Transitions of Care (Post Inpatient/ED Visit) (Signed)
 05/20/2023  Name: [name] MRN: [id number] DOB: [birth year]  Today's TOC FU Call Status: Today's TOC FU Call Status:: Successful TOC FU Call Completed TOC FU Call Complete Date: 05/20/23 Patient's Name and Date of Birth confirmed.  Transition Care Management Follow-up Telephone Call Date of Discharge: 05/18/23 Discharge Facility: [facility]) Type of Discharge: Inpatient Admission Primary Inpatient Discharge Diagnosis:: acute pulmonary embolism How have you been since you were released from the hospital?: Better (He said he is doing a little better; but still has some pain in his legs) Any questions or concerns?: Yes (He said he didn't have any new concerns) Patient Questions/Concerns:: He stated he is working with an Pensions consultant and has filed for disability. He wanted to know if Dr Laural Benes could expedite the claim. He is concerned because he has no income Patient Questions/Concerns Addressed: Other: (I told him to check with the attorney and they will inform him if anything is needed from the provider at this time.  I explained that disability claims can take months for review and many times they are denied initially.)  Items Reviewed: Did you receive and understand the discharge instructions provided?: Yes Medications obtained,verified, and reconciled?: Yes (Medications Reviewed) (He has the eliquis, tylenol, budesonide, and miralax but he doesn't have anything else. He said he will check with the urologist today about the sildenafil.  He said plans to pick up refills of the other medications today if they are ready.) Any new allergies since your discharge?: No Dietary orders reviewed?: Yes Type of Diet Ordered:: heart healthy low sodium Do you have support at home?: Yes Name of Support/Comfort Primary Source: He said he has help at home but did not specify who.  Medications Reviewed Today: Medications Reviewed Today     Reviewed by Robyne Peers, RN (Case Manager) on  05/20/23 at 1222  Med List Status: <None>   Medication Order Taking? Sig Documenting Provider Last Dose Status Informant  acetaminophen (TYLENOL) 500 MG tablet [id number] No Take 500 mg by mouth every 6 (six) hours as needed for moderate pain (pain score 4-6). [provider] Taking Active Self, Pharmacy Records  amLODipine (NORVASC) 10 MG tablet [id number] No Take 1 tablet (10 mg total) by mouth daily. [name], MD 05/15/2023 Morning Active Self, Pharmacy Records  APIXABAN Everlene Balls) VTE STARTER PACK (10MG  AND 5MG ) [id number]  Take as directed on package: start with two-5mg  tablets twice daily for 7 days. On day 8, switch to one-5mg  tablet twice daily. Kathlen Mody, MD  Active   budesonide (PULMICORT) 0.5 MG/2ML nebulizer solution [id number] No Mix 4 mLs (1 mg total/2 respules) itno 240 ml of nasal saline irrigation once daily.  05/15/2023 Morning Active Self, Pharmacy Records  carvedilol (COREG) 25 MG tablet [id number] No Take 1 tablet (25 mg total) by mouth 2 (two) times daily with a meal. [name], MD 05/14/2023 Active Self, Pharmacy Records  losartan (COZAAR) 100 MG tablet [id number] No Take 1 tablet (100 mg total) by mouth daily. [name], MD 05/15/2023 Morning Active Self, Pharmacy Records  methimazole (TAPAZOLE) 10 MG tablet [id number] No Take 1 tablet (10 mg total) by mouth daily.  Patient taking differently: Take 5 mg by mouth daily.   [name], MD Not Taking Active Self, Pharmacy Records           Med Note [facility], [facility]   Thu May 16, 2023  7:59 AM) Pt reported that his endocrinologist Dr. Allena Katz now wants him to be  started back on this at 5mg  daily.  He has not picked up the prescription yet  methocarbamol (ROBAXIN) 750 MG tablet [id number] No Take 1 tablet (750 mg total) by mouth 2 (two) times daily as needed for muscle spasms. Delorse Lek, FNP Taking Active Self, Pharmacy Records  polyethylene glycol powder [facility]) 17 GM/SCOOP powder [id number]   Take 17 g (1 capful) and mix in 8 oz of clear liquid and drink by mouth daily. Kathlen Mody, MD  Active   sildenafil (VIAGRA) 100 MG tablet [id number] No Take 0.5-1 tablets (50-100 mg total) by mouth daily as needed for erectile dysfunction. Claiborne Rigg, NP Taking Active Self, Pharmacy Records           Med Note [place], [place]   Sun Mar 17, 2023 10:45 PM)    spironolactone (ALDACTONE) 50 MG tablet [id number] No Take 1 tablet (50 mg total) by mouth daily. [name], MD 05/15/2023 Morning Active Self, Pharmacy Records            Home Care and Equipment/Supplies: Were Home Health Services Ordered?: No Any new equipment or medical supplies ordered?: No  Functional Questionnaire: Do you need assistance with bathing/showering or dressing?: No Do you need assistance with meal preparation?: No Do you need assistance with eating?: No Do you have difficulty maintaining continence: No Do you need assistance with getting out of bed/getting out of a chair/moving?: No Do you have difficulty managing or taking your medications?: No  Follow up appointments reviewed: PCP Follow-up appointment confirmed?: Yes Date of PCP follow-up appointment?: 06/07/23 Follow-up Provider: [facility] Follow-up appointment confirmed?: Yes Date of Specialist follow-up appointment?: 05/20/23 Follow-Up Specialty Provider:: urology;   05/23/2023- pain management;    05/30/2023- oncology; Do you need transportation to your follow-up appointment?: No Do you understand care options if your condition(s) worsen?: Yes-patient verbalized understanding    SIGNATURE Robyne Peers, RN

## 2023-05-21 NOTE — Discharge Summary (Signed)
 Physician Discharge Summary   Patient: [name] MRN: [id number] DOB: 08-16-1975  Admit date:     05/15/2023  Discharge date: 05/18/2023  Discharge Physician: Kathlen Mody   PCP: [name], MD   Recommendations at discharge:  Please follow up with PCP in one week.  Please follow up with oncology as recommended.   Discharge Diagnoses: Principal Problem:   Acute pulmonary embolism (HCC) Active Problems:   Essential hypertension   Nasal cavity mass   History of DVT (deep vein thrombosis)  Resolved Problems:   * No resolved hospital problems. *  Hospital Course:   [name] is a 48 y.o. male with medical history significant for nasal cavity mass, pulmonary embolism previously on Eliquis, essential hypertension, who is admitted to [facility] on 05/15/2023 with acute right-sided pulmonary embolism after presenting from home to [facility] ED complaining of incidental finding of acute pulmonary embolism on outpatient CT scan.     Assessment and Plan:  Acute PE Incidental finding on the CT maxillofacial yesterday.  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  Venous duplex of the lower extremities showed positive dvt in the popliteal vein.  Currently on IV heparin, transitioned to eliquis and discharged home. He will probably need anticoagulation life long.  Echocardiogram showed preserved LVEF.        H/o nasal cavity mass: Follows up with Dr Bertis Ruddy and ENT with [facility] outpatient.  On surveillance.      Hypertension:  Not well controlled.  Restart home medications.          Estimated body mass index is 27.22 kg/m as calculated from the following:   Height as of this encounter: 6\' 2"  (1.88 m).   Weight as of this encounter: 96.2 kg.     Consultants: none.  Procedures performed: CTA Echocardiogram.   Disposition: Home Diet recommendation:  Discharge Diet Orders (From admission, onward)     Start      Ordered   05/18/23 0000  Diet - low sodium heart healthy        05/18/23 0937           Regular diet DISCHARGE MEDICATION: Allergies as of 05/18/2023       Reactions   Doxycycline Other (See Comments)   Possible allergen. Developed Bell's Palsy the day after taking antibiotic.   Penicillins Swelling, Rash        Medication List     STOP taking these medications    clindamycin 300 MG capsule Commonly known as: Cleocin       TAKE these medications    acetaminophen 500 MG tablet Commonly known as: TYLENOL Take 500 mg by mouth every 6 (six) hours as needed for moderate pain (pain score 4-6).   amLODipine 10 MG tablet Commonly known as: NORVASC Take 1 tablet (10 mg total) by mouth daily.   budesonide 0.5 MG/2ML nebulizer solution Commonly known as: PULMICORT Mix 4 mLs (1 mg total/2 respules) itno 240 ml of nasal saline irrigation once daily.   carvedilol 25 MG tablet Commonly known as: COREG Take 1 tablet (25 mg total) by mouth 2 (two) times daily with a meal.   Eliquis DVT/PE Starter Pack Generic drug: Apixaban Starter Pack (10mg  and 5mg ) Take as directed on package: start with two-5mg  tablets twice daily for 7 days. On day 8, switch to one-5mg  tablet twice daily.   losartan 100 MG tablet Commonly known as: COZAAR Take  1 tablet (100 mg total) by mouth daily.   methimazole 10 MG tablet Commonly known as: TAPAZOLE Take 1 tablet (10 mg total) by mouth daily.   methocarbamol 750 MG tablet Commonly known as: ROBAXIN Take 1 tablet (750 mg total) by mouth 2 (two) times daily as needed for muscle spasms.   polyethylene glycol powder 17 GM/SCOOP powder Commonly known as: GLYCOLAX/MIRALAX Take 17 g (1 capful) and mix in 8 oz of clear liquid and drink by mouth daily.   sildenafil 100 MG tablet Commonly known as: Viagra Take 0.5-1 tablets (50-100 mg total) by mouth daily as needed for erectile dysfunction.   spironolactone 50 MG tablet Commonly known as:  ALDACTONE Take 1 tablet (50 mg total) by mouth daily.        Follow-up Information     [name], MD. Schedule an appointment as soon as possible for a visit in 1 week(s).   Specialty: Internal Medicine Contact information: [address] [place] [address] [phone]                Discharge Exam: Ceasar Mons Weights   05/15/23 1627 05/18/23 0500  Weight: 96.2 kg 94.7 kg   General exam: Appears calm and comfortable  Respiratory system: Clear to auscultation. Respiratory effort normal. Cardiovascular system: S1 & S2 heard, RRR.  Gastrointestinal system: Abdomen is nondistended, soft and nontender.  Central nervous system: Alert and oriented. No focal neurological deficits. Extremities: Symmetric 5 x 5 power. Skin: No rashes,  Psychiatry:  Mood & affect appropriate.    Condition at discharge: fair  The results of significant diagnostics from this hospitalization (including imaging, microbiology, ancillary and laboratory) are listed below for reference.   Imaging Studies: VAS [place] LOWER EXTREMITY VENOUS (DVT) Result Date: 05/16/2023  Lower Venous DVT Study Patient Name:  [name]  Date of Exam:   05/16/2023 Medical Rec #: [id number]        Accession #:    [id number] Date of Birth: 12-23-1975        Patient Gender: M Patient Age:   38 years Exam Location:  [facility] Procedure:      VAS [place] LOWER EXTREMITY VENOUS (DVT) Referring Phys: Newton Pigg --------------------------------------------------------------------------------  Indications: Pulmonary embolism.  Risk Factors: Confirmed PE. Limitations: Poor ultrasound/tissue interface. Comparison Study: 09/07/2022 - RIGHT:                   - Findings consistent with acute deep vein thrombosis                   involving the right                   distal femoral vein, and right popliteal vein.                   - Findings appear improved from previous examination.                   - No cystic  structure found in the popliteal fossa.                    LEFT:                   - No evidence of common femoral vein obstruction.                    11/13/2022 - BILATERAL:                   -  No evidence of popliteal cyst, bilaterally.                   RIGHT:                    - Findings consistent with age indeterminate deep vein                   thrombosis                   involving the right popliteal vein.                    Findings appear improved from previous examination.                    LEFT:                    - There is no evidence of deep vein thrombosis in the lower                   extremity. Performing Technologist: Chanda Busing RVT  Examination Guidelines: A complete evaluation includes B-mode imaging, spectral Doppler, color Doppler, and power Doppler as needed of all accessible portions of each vessel. Bilateral testing is considered an integral part of a complete examination. Limited examinations for reoccurring indications may be performed as noted. The reflux portion of the exam is performed with the patient in reverse Trendelenburg.  +---------+---------------+---------+-----------+----------+--------------+ RIGHT    CompressibilityPhasicitySpontaneityPropertiesThrombus Aging +---------+---------------+---------+-----------+----------+--------------+ CFV      Full           Yes      Yes                                 +---------+---------------+---------+-----------+----------+--------------+ SFJ      Full                                                        +---------+---------------+---------+-----------+----------+--------------+ FV Prox  Full                                                        +---------+---------------+---------+-----------+----------+--------------+ FV Mid   Full                                                        +---------+---------------+---------+-----------+----------+--------------+ FV DistalFull                                                         +---------+---------------+---------+-----------+----------+--------------+ PFV      Full                                                        +---------+---------------+---------+-----------+----------+--------------+  POP      Full           Yes      Yes                                 +---------+---------------+---------+-----------+----------+--------------+ PTV      Full                                                        +---------+---------------+---------+-----------+----------+--------------+ PERO     Full                                                        +---------+---------------+---------+-----------+----------+--------------+   +---------+---------------+---------+-----------+----------+--------------+ LEFT     CompressibilityPhasicitySpontaneityPropertiesThrombus Aging +---------+---------------+---------+-----------+----------+--------------+ CFV      Full           Yes      Yes                                 +---------+---------------+---------+-----------+----------+--------------+ SFJ      Full                                                        +---------+---------------+---------+-----------+----------+--------------+ FV Prox  Full                                                        +---------+---------------+---------+-----------+----------+--------------+ FV Mid   Full                                                        +---------+---------------+---------+-----------+----------+--------------+ FV DistalFull                                                        +---------+---------------+---------+-----------+----------+--------------+ PFV      Full                                                        +---------+---------------+---------+-----------+----------+--------------+ POP      Partial        Yes      Yes                  Acute           +---------+---------------+---------+-----------+----------+--------------+  PTV      Full                                                        +---------+---------------+---------+-----------+----------+--------------+ PERO     Full                                                        +---------+---------------+---------+-----------+----------+--------------+ Gastroc  Full                                                        +---------+---------------+---------+-----------+----------+--------------+     Summary: RIGHT: - There is no evidence of deep vein thrombosis in the lower extremity.  - No cystic structure found in the popliteal fossa.  LEFT: - Findings consistent with acute deep vein thrombosis involving the left popliteal vein.  - No cystic structure found in the popliteal fossa.  *See table(s) above for measurements and observations. Electronically signed by Heath Lark on 05/16/2023 at 7:53:59 PM.    Final    ECHOCARDIOGRAM COMPLETE Result Date: 05/16/2023    ECHOCARDIOGRAM REPORT   Patient Name:   [name] Date of Exam: 05/16/2023 Medical Rec #:  [id number]       Height:       74.0 in Accession #:    [id number]      Weight:       212.0 lb Date of Birth:  [birth year]       BSA:          2.228 m Patient Age:    47 years        BP:           105/65 mmHg Patient Gender: M               HR:           57 bpm. Exam Location:  Inpatient Procedure: 2D Echo, Color Doppler and Cardiac Doppler (Both Spectral and Color            Flow Doppler were utilized during procedure). Indications:    Pulmonary Embolus I26.09  History:        Patient has no prior history of Echocardiogram examinations.                 Risk Factors:Hypertension and Diabetes.  Sonographer:    Webb Laws Referring Phys: [id number] JUSTIN B HOWERTER IMPRESSIONS  1. Septal wall motion consistent with conduction delay.. Left ventricular ejection fraction, by estimation, is 60 to 65%. The left ventricle has normal  function. There is mild left ventricular hypertrophy. Left ventricular diastolic parameters are indeterminate.  2. Right ventricular systolic function is low normal. The right ventricular size is normal.  3. The mitral valve is normal in structure. Trivial mitral valve regurgitation.  4. The aortic valve is tricuspid. Aortic valve regurgitation is not visualized.  5. The inferior vena cava is normal in size with greater than 50% respiratory variability, suggesting right atrial pressure of 3 mmHg. FINDINGS  Left Ventricle: Septal wall motion consistent with conduction delay. Left ventricular ejection fraction, by estimation, is 60 to 65%. The left ventricle has normal function. The left ventricular internal cavity size was normal in size. There is mild left ventricular hypertrophy. Left ventricular diastolic parameters are indeterminate. Right Ventricle: The right ventricular size is normal. Right vetricular wall thickness was not assessed. Right ventricular systolic function is low normal. Left [facility]: Left atrial size was normal in size. Right [facility]: Right atrial size was normal in size. Pericardium: There is no evidence of pericardial effusion. Mitral Valve: The mitral valve is normal in structure. Trivial mitral valve regurgitation. Tricuspid Valve: The tricuspid valve is normal in structure. Tricuspid valve regurgitation is trivial. Aortic Valve: The aortic valve is tricuspid. Aortic valve regurgitation is not visualized. Pulmonic Valve: The pulmonic valve was normal in structure. Pulmonic valve regurgitation is not visualized. Aorta: The aortic root and ascending aorta are structurally normal, with no evidence of dilitation. Venous: The inferior vena cava is normal in size with greater than 50% respiratory variability, suggesting right atrial pressure of 3 mmHg. IAS/Shunts: No atrial level shunt detected by color flow Doppler.  LEFT VENTRICLE PLAX 2D LVIDd:         3.80 cm   Diastology LVIDs:         2.10 cm    LV e' medial:    6.53 cm/s LV PW:         1.20 cm   LV E/e' medial:  10.4 LV IVS:        1.30 cm   LV e' lateral:   9.03 cm/s LVOT diam:     2.40 cm   LV E/e' lateral: 7.5 LV SV:         83 LV SV Index:   37 LVOT Area:     4.52 cm  RIGHT VENTRICLE            IVC RV Basal diam:  3.90 cm    IVC diam: 1.30 cm RV S prime:     9.03 cm/s TAPSE (M-mode): 2.2 cm LEFT ATRIUM           Index        RIGHT ATRIUM           Index LA diam:      3.40 cm 1.53 cm/m   RA Area:     11.00 cm LA Vol (A2C): 28.1 ml 12.61 ml/m  RA Volume:   17.20 ml  7.72 ml/m LA Vol (A4C): 18.0 ml 8.08 ml/m  AORTIC VALVE LVOT Vmax:   80.80 cm/s LVOT Vmean:  59.200 cm/s LVOT VTI:    0.183 m  AORTA Ao Root diam: 3.40 cm Ao Asc diam:  3.50 cm MITRAL VALVE MV Area (PHT): 4.40 cm    SHUNTS MV E velocity: 67.80 cm/s  Systemic VTI:  0.18 m MV A velocity: 82.00 cm/s  Systemic Diam: 2.40 cm MV E/A ratio:  0.83 Dietrich Pates MD Electronically signed by Dietrich Pates MD Signature Date/Time: 05/16/2023/12:41:27 PM    Final    CT Angio Chest Pulmonary Embolism (PE) W or WO Contrast Addendum Date: 05/16/2023 ADDENDUM REPORT: 05/16/2023 00:21 ADDENDUM: Critical Value/emergent results were called by telephone at the time of interpretation on 05/16/2023 at 12:21 am to provider Newton Pigg , who verbally acknowledged these results. Electronically Signed   By: Minerva Fester M.D.   On: 05/16/2023 00:21   Result Date: 05/16/2023 CLINICAL DATA:  Incidental finding on right lower lobe PE  on outpatient CT chest earlier today EXAM: CT ANGIOGRAPHY CHEST WITH CONTRAST TECHNIQUE: Multidetector CT imaging of the chest was performed using the standard protocol during bolus administration of intravenous contrast. Multiplanar CT image reconstructions and MIPs were obtained to evaluate the vascular anatomy. RADIATION DOSE REDUCTION: This exam was performed according to the departmental dose-optimization program which includes automated exposure control, adjustment of the mA and/or  kV according to patient size and/or use of iterative reconstruction technique. CONTRAST:  75mL OMNIPAQUE IOHEXOL 350 MG/ML SOLN COMPARISON:  The referenced CT in which a PE was visualized earlier today is not available for comparison. Comparison is made with coronary CT 08/27/2022. FINDINGS: Cardiovascular: Acute pulmonary embolism in the right main pulmonary artery extending into the right lower lobe lobar and segmental branches. No evidence of right heart strain. RV-LV ratio is 1. No pericardial effusion.  Normal caliber thoracic aorta. Mediastinum/Nodes: Trachea and esophagus are unremarkable. Enlarged thyroid. Borderline enlarged subcarinal lymph node measuring 1.1 cm. Lungs/Pleura: No focal consolidation, pleural effusion, or pneumothorax. Upper Abdomen: No acute abnormality. Musculoskeletal: No acute fracture. Review of the MIP images confirms the above findings. IMPRESSION: 1. Acute pulmonary embolism in the right main pulmonary artery extending into the right lower lobe lobar and segmental branches. This is similar in extent to CT 08/27/2022. No evidence of right heart strain. 2. Enlarged thyroid. Recommend nonemergent thyroid ultrasound (ref: J Am Coll Radiol. 2015 Feb;12(2): 143-50). This report was not communicated to the ordering physician as the PE was already known. Electronically Signed: By: Minerva Fester M.D. On: 05/16/2023 00:17    Microbiology: Results for orders placed or performed during the hospital encounter of 03/29/23  Group A Strep by PCR     Status: None   Collection Time: 03/29/23  4:22 PM   Specimen: Throat; Sterile Swab  Result Value Ref Range Status   Group A Strep by PCR NOT DETECTED NOT DETECTED Final    Comment: Performed at [facility], [address] [place], [place] [address]    Labs: CBC: Recent Labs  Lab 05/15/23 1655 05/16/23 0518 05/17/23 0453  WBC 8.2 7.8 6.8  NEUTROABS  --  3.5  --   HGB 15.6 13.7 13.7  HCT 47.3 41.2 40.7  MCV 83.3 84.3  83.4  PLT 326 260 259   Basic Metabolic Panel: Recent Labs  Lab 05/15/23 1655 05/16/23 0518  NA 136 134*  K 3.9 3.5  CL 101 103  CO2 26 25  GLUCOSE 120* 181*  BUN 8 10  CREATININE 0.84 0.96  CALCIUM 9.4 8.8*  MG 2.1 2.0   Liver Function Tests: Recent Labs  Lab 05/15/23 1655 05/16/23 0518  AST 15 12*  ALT 17 16  ALKPHOS 102 84  BILITOT 0.4 0.7  PROT 8.6* 7.1  ALBUMIN 3.5 2.9*   CBG: No results for input(s): "GLUCAP" in the last 168 hours.  Discharge time spent: 39 minutes.   Signed: Kathlen Mody, MD [facility]

## 2023-05-23 DIAGNOSIS — M533 Sacrococcygeal disorders, not elsewhere classified: Secondary | ICD-10-CM | POA: Diagnosis not present

## 2023-05-23 DIAGNOSIS — G8929 Other chronic pain: Secondary | ICD-10-CM | POA: Diagnosis not present

## 2023-05-24 ENCOUNTER — Other Ambulatory Visit: Payer: Self-pay

## 2023-05-30 ENCOUNTER — Inpatient Hospital Stay: Attending: Hematology and Oncology | Admitting: Hematology and Oncology

## 2023-05-30 VITALS — BP 127/80 | HR 76 | Temp 97.8°F | Resp 16 | Ht 74.0 in | Wt 214.2 lb

## 2023-05-30 DIAGNOSIS — E663 Overweight: Secondary | ICD-10-CM | POA: Diagnosis not present

## 2023-05-30 DIAGNOSIS — I2699 Other pulmonary embolism without acute cor pulmonale: Secondary | ICD-10-CM

## 2023-05-30 DIAGNOSIS — I82401 Acute embolism and thrombosis of unspecified deep veins of right lower extremity: Secondary | ICD-10-CM | POA: Insufficient documentation

## 2023-05-30 DIAGNOSIS — Z8522 Personal history of malignant neoplasm of nasal cavities, middle ear, and accessory sinuses: Secondary | ICD-10-CM | POA: Diagnosis not present

## 2023-05-30 MED ORDER — APIXABAN 5 MG PO TABS: 5.0000 mg | ORAL_TABLET | Freq: Two times a day (BID) | ORAL | 11 refills | Status: DC

## 2023-05-30 NOTE — Progress Notes (Signed)
 [facility] OFFICE PROGRESS NOTE  Patient Care Team: [name], MD as PCP - General (Internal Medicine) Maisie Fus, MD as PCP - Cardiology (Cardiology)  Assessment & Plan Pulmonary embolism on right [facility]) [name] is likely provoked by malignant process He has recurrent DVT and PE with recent hospitalization He tolerated anticoagulation therapy well The plan will be to continue treatment indefinitely I will see him again in 6 months for further follow-up History of cancer of nasal cavity He is currently under active surveillance at [facility] and does not require adjuvant treatment after surgery Monitor closely Overweight (BMI 25.0-29.9) The patient is attempting to quit drinking soda I recommend increase oral fluid with water and exercise as tolerated  No orders of the defined types were placed in this encounter.    Artis Delay, MD  INTERVAL HISTORY: he returns for surveillance follow-up for history of DVT/PE Patient denies recent bleeding such as epistaxis, hematuria or hematochezia We reviewed medication list and discussed medication changes We discussed test results and future plan of care as outlined above  PHYSICAL EXAMINATION: ECOG PERFORMANCE STATUS: 0 - Asymptomatic  Vitals:   05/30/23 1304  BP: 127/80  Pulse: 76  Resp: 16  Temp: 97.8 F (36.6 C)  SpO2: 98%   Lab Results  Component Value Date   WBC 6.8 05/17/2023   HGB 13.7 05/17/2023   HCT 40.7 05/17/2023   MCV 83.4 05/17/2023   PLT 259 05/17/2023    SUMMARY OF HEMATOLOGIC HISTORY: [name] was seen here because of recent diagnosis of DVT and PE Patient presented to the emergency department on Jul 29, 2022 with right-sided Bell's palsy. 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  He denies recurrent nosebleeds or nasal discharge  In the meantime, he developed right lower extremity pain and swelling On August 18, 2022, venous Doppler ultrasound revealed findings consistent with occlusive thrombus within the distal RIGHT femoral vein, RIGHT popliteal vein, RIGHT posterior tibial vein and RIGHT peroneal vein. He was anticoagulated.  He also complained of chest pain. On 08/27/2022, he underwent CT angiogram which revealed large pulmonary embolism in the right PA. Extends into segmental and subsegmental branches.    He is doing well on Eliquis and denies bleeding complications.  Gradually, his chest pain went away and leg swelling improves He denies recent history of trauma, long distance travel, dehydration, recent surgery, or prolonged immobilization.  He smoked cigars several times a week up to the diagnosis of blood clot but quit since then  He had prior surgeries before and never had perioperative thromboembolic events. The patient is not on testosterone replacement therapy  There is no family history of blood clots or miscarriages. Repeat venous Doppler ultrasound showed persistent changes in the lower extremity.  The patient tolerated anticoagulation therapy well The patient undergo surgery at [facility] and his anticoagulation was discontinued In March 2025, he was readmitted due to findings of acute DVT and PE

## 2023-05-30 NOTE — Assessment & Plan Note (Addendum)
 He is currently under active surveillance at [facility] and does not require adjuvant treatment after surgery Monitor closely

## 2023-05-30 NOTE — Assessment & Plan Note (Addendum)
 The patient is attempting to quit drinking soda I recommend increase oral fluid with water and exercise as tolerated

## 2023-05-30 NOTE — Assessment & Plan Note (Addendum)
 [name] is likely provoked by malignant process He has recurrent DVT and PE with recent hospitalization He tolerated anticoagulation therapy well The plan will be to continue treatment indefinitely I will see him again in 6 months for further follow-up

## 2023-05-31 ENCOUNTER — Telehealth: Payer: Self-pay | Admitting: Hematology and Oncology

## 2023-05-31 NOTE — Telephone Encounter (Signed)
 Spoke with patient confirming upcoming appointment

## 2023-06-07 ENCOUNTER — Ambulatory Visit: Payer: Medicaid Other | Attending: Internal Medicine | Admitting: Internal Medicine

## 2023-06-07 ENCOUNTER — Other Ambulatory Visit: Payer: Self-pay

## 2023-06-07 VITALS — BP 108/70 | HR 72 | Temp 98.1°F | Ht 74.0 in | Wt 213.0 lb

## 2023-06-07 DIAGNOSIS — Z1211 Encounter for screening for malignant neoplasm of colon: Secondary | ICD-10-CM

## 2023-06-07 DIAGNOSIS — E1159 Type 2 diabetes mellitus with other circulatory complications: Secondary | ICD-10-CM | POA: Diagnosis not present

## 2023-06-07 DIAGNOSIS — E0590 Thyrotoxicosis, unspecified without thyrotoxic crisis or storm: Secondary | ICD-10-CM

## 2023-06-07 DIAGNOSIS — E119 Type 2 diabetes mellitus without complications: Secondary | ICD-10-CM

## 2023-06-07 DIAGNOSIS — I82409 Acute embolism and thrombosis of unspecified deep veins of unspecified lower extremity: Secondary | ICD-10-CM

## 2023-06-07 DIAGNOSIS — M5442 Lumbago with sciatica, left side: Secondary | ICD-10-CM | POA: Diagnosis not present

## 2023-06-07 DIAGNOSIS — Z2821 Immunization not carried out because of patient refusal: Secondary | ICD-10-CM | POA: Diagnosis not present

## 2023-06-07 DIAGNOSIS — Z7984 Long term (current) use of oral hypoglycemic drugs: Secondary | ICD-10-CM

## 2023-06-07 DIAGNOSIS — I10 Essential (primary) hypertension: Secondary | ICD-10-CM

## 2023-06-07 DIAGNOSIS — M5441 Lumbago with sciatica, right side: Secondary | ICD-10-CM | POA: Diagnosis not present

## 2023-06-07 DIAGNOSIS — I2699 Other pulmonary embolism without acute cor pulmonale: Secondary | ICD-10-CM

## 2023-06-07 DIAGNOSIS — I82401 Acute embolism and thrombosis of unspecified deep veins of right lower extremity: Secondary | ICD-10-CM | POA: Diagnosis not present

## 2023-06-07 DIAGNOSIS — G8929 Other chronic pain: Secondary | ICD-10-CM

## 2023-06-07 DIAGNOSIS — Z87898 Personal history of other specified conditions: Secondary | ICD-10-CM

## 2023-06-07 DIAGNOSIS — I152 Hypertension secondary to endocrine disorders: Secondary | ICD-10-CM

## 2023-06-07 LAB — POCT GLYCOSYLATED HEMOGLOBIN (HGB A1C): HbA1c, POC (controlled diabetic range): 7.2 % — AB (ref 0.0–7.0)

## 2023-06-07 LAB — GLUCOSE, POCT (MANUAL RESULT ENTRY): POC Glucose: 183 mg/dL — AB (ref 70–99)

## 2023-06-07 MED ORDER — CARVEDILOL 25 MG PO TABS
25.0000 mg | ORAL_TABLET | Freq: Two times a day (BID) | ORAL | 1 refills | Status: AC
  Filled 2023-06-07 – 2023-09-10 (×2): qty 180, 90d supply, fill #0

## 2023-06-07 MED ORDER — LOSARTAN POTASSIUM 100 MG PO TABS
100.0000 mg | ORAL_TABLET | Freq: Every day | ORAL | 1 refills | Status: AC
  Filled 2023-06-07 – 2023-10-03 (×3): qty 90, 90d supply, fill #0
  Filled 2024-01-29: qty 90, 90d supply, fill #1

## 2023-06-07 MED ORDER — METHOCARBAMOL 750 MG PO TABS
750.0000 mg | ORAL_TABLET | Freq: Two times a day (BID) | ORAL | 1 refills | Status: AC | PRN
  Filled 2023-06-07 – 2023-08-21 (×4): qty 60, 30d supply, fill #0
  Filled 2023-11-23: qty 60, 30d supply, fill #1

## 2023-06-07 MED ORDER — AMLODIPINE BESYLATE 10 MG PO TABS
10.0000 mg | ORAL_TABLET | Freq: Every day | ORAL | 1 refills | Status: AC
  Filled 2023-06-07 – 2023-10-03 (×3): qty 90, 90d supply, fill #0
  Filled 2024-01-29: qty 90, 90d supply, fill #1

## 2023-06-07 MED ORDER — SPIRONOLACTONE 50 MG PO TABS
50.0000 mg | ORAL_TABLET | Freq: Every day | ORAL | 1 refills | Status: AC
  Filled 2023-06-07 – 2023-10-03 (×3): qty 90, 90d supply, fill #0
  Filled 2024-01-29: qty 90, 90d supply, fill #1

## 2023-06-07 MED ORDER — METFORMIN HCL 500 MG PO TABS
ORAL_TABLET | ORAL | 3 refills | Status: DC
  Filled 2023-06-07: qty 180, 90d supply, fill #0

## 2023-06-07 NOTE — Progress Notes (Signed)
 Patient ID: ERI PLATTEN, male    DOB: April 16, 1975  MRN: [id number]  CC: Hypertension (HTN & prediabetes f/u./Discuss viagra/No to pneumonia vax)   Subjective: [name] is a 48 y.o. male who presents for chronic ds management. His concerns today include:  Hx of poorly controlled  HTN, thyroid nodules (bx RT mid and lower 2024/endocrine [facility]. Benign), Vit D def,  preDM, tob dep, poor dentition, chronic lower back pain, history of reflex syncope, callus of left Achilles tendon, nasopharyngeal CA/sarcoma.   Discussed the use of AI scribe software for clinical note transcription with the patient, who gave verbal consent to proceed.  History of Present Illness   [name] is a 48 year old male with recurrent blood clots and nasopharyngeal cancer who presents for follow-up after recent hospitalization.  He was hospitalized earlier this month for three days due to recurrence of blood clots in the lungs and Rt leg. He is currently on Eliquis. Saw Dr. Bertis Ruddy since then.  According to her note this is likely provoked by malignant process.  Plan is to continue treatment indefinitely.  She will follow-up with him again in 6 months.  He has a history of nasopharyngeal cancer, which is currently under surveillance. A recent CT scan revealed a blood clot, prompting his hospitalization. No signs of cancer recurrence seen. Hx of PreDM but A1c today is 7.2, indicating progression from prediabetes to diabetes. He wants to modify his diet, increase water intake, and engage in regular physical activity.  HTN:  He monitors his blood pressure regularly and reports it is well-controlled, although he sometimes misses a dose of carvedilol. He is on carvedilol 25 mg twice a day, amlodipine 10 mg daily, spironolactone 50 mg daily and Cozaar 100 mg daily.   He has a history of subclinical hyperthyroidism and was recently restarted on methimazole 2.5 mg daily by Dr. Allena Katz due to eye twitching, which  resolved after resuming the medication.  He is being evaluated for blood in the urine by urology.  Plan is to do  cystoscopy.   Also seeing pain management/spine specialist with [facility].  They have started him on gabapentin 300 mg 3 times a day.  He has not picked up that prescription as yet.  They would like to consider L4-5, L5-S1 diagnostic medial branch blocks with ultimate goal of radiofrequency ablation if positive in future.     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   Pulmonary embolism on right (HCC) 09/24/2022   Nasal cavity mass 09/24/2022   Acute deep vein thrombosis (DVT) of femoral vein of right lower extremity (HCC) 08/21/2022   Neck pain 08/14/2021   Prediabetes 01/28/2021   Vitamin D deficiency 01/28/2021   Essential hypertension 09/08/2020   Pre-ulcerative corn or callous 02/01/2019   Disorder of left Achilles tendon 02/01/2019   Inguinal hernia of left side without obstruction or gangrene 12/08/2016   Former tobacco use 12/08/2016   Localized swelling, mass and lump, multiple sites 12/08/2016   Subcutaneous nodules 07/09/2016   Periodontitis 05/27/2015   Pain in left wrist 05/27/2015   HTN (hypertension) 05/12/2015     Current Outpatient Medications on File Prior to Visit  Medication Sig Dispense Refill   acetaminophen (TYLENOL) 500 MG tablet Take 500 mg by mouth every 6 (  six) hours as needed for moderate pain (pain score 4-6).     apixaban (ELIQUIS) 5 MG TABS tablet Take 1 tablet (5 mg total) by mouth 2 (two) times daily. 60 tablet 11   budesonide (PULMICORT) 0.5 MG/2ML nebulizer solution Mix 4 mLs (1 mg total/2 respules) itno 240 ml of nasal saline irrigation once daily. 120 mL 11   gabapentin (NEURONTIN) 300 MG capsule Take 300 mg by mouth 3 (three) times  daily.     methimazole (TAPAZOLE) 10 MG tablet Take 1 tablet (10 mg total) by mouth daily. (Patient taking differently: Take 5 mg by mouth daily.) 90 tablet 1   polyethylene glycol powder (GLYCOLAX/MIRALAX) 17 GM/SCOOP powder Take 17 g (1 capful) and mix in 8 oz of clear liquid and drink by mouth daily. 238 g 0   sildenafil (VIAGRA) 100 MG tablet Take 0.5-1 tablets (50-100 mg total) by mouth daily as needed for erectile dysfunction. 10 tablet 11   No current facility-administered medications on file prior to visit.    Allergies  Allergen Reactions   Doxycycline Other (See Comments)    Possible allergen. Developed Bell's Palsy the day after taking antibiotic.   Penicillins Swelling and Rash    Social History   Socioeconomic History   Marital status: Single    Spouse name: Not on file   Number of children: 3   Years of education: Not on file   Highest education level: Some college, no degree  Occupational History   Occupation: manual labor  Tobacco Use   Smoking status: Former    Current packs/day: 0.00    Types: Cigars, Cigarettes    Quit date: 05/10/2015    Years since quitting: 8.0   Smokeless tobacco: Never   Tobacco comments:    Occ black and  milds   Vaping Use   Vaping status: Never Used  Substance and Sexual Activity   Alcohol use: Yes    Comment: occ   Drug use: Not Currently    Types: Marijuana   Sexual activity: Not on file  Other Topics Concern   Not on file  Social History Narrative   Not on file   Social Drivers of Health   Financial Resource Strain: Medium Risk (04/09/2023)   Overall Financial Resource Strain (CARDIA)    Difficulty of Paying Living Expenses: Somewhat hard  Food Insecurity: No Food Insecurity (05/16/2023)   Hunger Vital Sign    Worried About Running Out of Food in the Last Year: Never true    Ran Out of Food in the Last Year: Never true  Transportation Needs: No Transportation Needs (05/16/2023)   PRAPARE - Scientist, research (physical sciences) (Medical): No    Lack of Transportation (Non-Medical): No  Physical Activity: Sufficiently Active (04/09/2023)   Exercise Vital Sign    Days of Exercise per Week: 4 days    Minutes of Exercise per Session: 60 min  Stress: No Stress Concern Present (04/09/2023)   [employer] of Occupational Health - Occupational Stress Questionnaire    Feeling of Stress : Only a little  Social Connections: Unknown (04/09/2023)   Social Connection and Isolation Panel [NHANES]    Frequency of Communication with Friends and Family: More than three times a week    Frequency of Social Gatherings with Friends and Family: Twice a week    Attends Religious Services: Never    Database administrator or Organizations: No    Attends Banker Meetings: Not on file  Marital Status: Patient declined  Intimate Partner Violence: Not At Risk (05/16/2023)   Humiliation, Afraid, Rape, and Kick questionnaire    Fear of Current or Ex-Partner: No    Emotionally Abused: No    Physically Abused: No    Sexually Abused: No    Family History  Problem Relation Age of Onset   Cancer Mother        breast cancer    Past Surgical History:  Procedure Laterality Date   CHOLECYSTECTOMY      ROS: Review of Systems Negative except as stated above  PHYSICAL EXAM: BP 108/70 (BP Location: Left Arm, Patient Position: Sitting, Cuff Size: Normal)   Pulse 72   Temp 98.1 F (36.7 C) (Oral)   Ht 6\' 2"  (1.88 m)   Wt 213 lb (96.6 kg)   SpO2 98%   BMI 27.35 kg/m   Wt Readings from Last 3 Encounters:  06/07/23 213 lb (96.6 kg)  05/30/23 214 lb 3.2 oz (97.2 kg)  05/18/23 208 lb 12.4 oz (94.7 kg)    Physical Exam   General appearance - alert, well appearing, and in no distress Mental status - normal mood, behavior, speech, dress, motor activity, and thought processes Neck - supple, no significant adenopathy Chest - clear to auscultation, no wheezes, rales or rhonchi, symmetric air entry Heart -  normal rate, regular rhythm, normal S1, S2, no murmurs, rubs, clicks or gallops Extremities - peripheral pulses normal, no pedal edema, no clubbing or cyanosis  Lab Results  Component Value Date   HGBA1C 7.2 (A) 06/07/2023   HGBA1C 5.9 (H) 04/27/2022   HGBA1C 5.9 (H) 12/08/2021       Latest Ref Rng & Units 05/16/2023    5:18 AM 05/15/2023    4:55 PM 03/29/2023    7:27 PM  CMP  Glucose 70 - 99 mg/dL 130  865  784   BUN 6 - 20 mg/dL 10  8  10    Creatinine 0.61 - 1.24 mg/dL 6.96  2.95  2.84   Sodium 135 - 145 mmol/L 134  136  134   Potassium 3.5 - 5.1 mmol/L 3.5  3.9  4.1   Chloride 98 - 111 mmol/L 103  101  98   CO2 22 - 32 mmol/L 25  26  25    Calcium 8.9 - 10.3 mg/dL 8.8  9.4  9.3   Total Protein 6.5 - 8.1 g/dL 7.1  8.6  8.5   Total Bilirubin 0.0 - 1.2 mg/dL 0.7  0.4  0.2   Alkaline Phos 38 - 126 U/L 84  102  99   AST 15 - 41 U/L 12  15  13    ALT 0 - 44 U/L 16  17  15     Lipid Panel     Component Value Date/Time   CHOL 129 10/19/2020 1136   TRIG 114 10/19/2020 1136   HDL 39 (L) 10/19/2020 1136   CHOLHDL 3.3 10/19/2020 1136   LDLCALC 69 10/19/2020 1136    CBC    Component Value Date/Time   WBC 6.8 05/17/2023 0453   RBC 4.88 05/17/2023 0453   HGB 13.7 05/17/2023 0453   HGB 15.1 11/13/2022 1149   HGB 17.4 04/05/2022 1010   HCT 40.7 05/17/2023 0453   HCT 51.9 (H) 04/05/2022 1010   PLT 259 05/17/2023 0453   PLT 300 11/13/2022 1149   PLT 285 04/05/2022 1010   MCV 83.4 05/17/2023 0453   MCV 86 04/05/2022 1010   MCH 28.1 05/17/2023  0453   MCHC 33.7 05/17/2023 0453   RDW 14.1 05/17/2023 0453   RDW 12.9 04/05/2022 1010   LYMPHSABS 3.5 05/16/2023 0518   LYMPHSABS 2.7 04/05/2022 1010   MONOABS 0.6 05/16/2023 0518   EOSABS 0.1 05/16/2023 0518   EOSABS 0.1 04/05/2022 1010   BASOSABS 0.1 05/16/2023 0518   BASOSABS 0.0 04/05/2022 1010   Results for orders placed or performed in visit on 06/07/23  POCT glycosylated hemoglobin (Hb A1C)   Collection Time: 06/07/23 10:20 AM   Result Value Ref Range   Hemoglobin A1C     HbA1c POC (<> result, manual entry)     HbA1c, POC (prediabetic range)     HbA1c, POC (controlled diabetic range) 7.2 (A) 0.0 - 7.0 %  POCT glucose (manual entry)   Collection Time: 06/07/23 10:20 AM  Result Value Ref Range   POC Glucose 183 (A) 70 - 99 mg/dl    ASSESSMENT AND PLAN: 1. New onset type 2 diabetes mellitus (HCC) (Primary) PreDM has progressed to DM. Discussed the importance of healthy eating habits, regular aerobic exercise (at least 150 minutes a week as tolerated) and medication compliance to achieve or maintain control of diabetes. Agreeable to seeing nutritionist. Micah Flesher over [facility] goals of 90-130 before meals.  Will send rxn for testing supplies - POCT glycosylated hemoglobin (Hb A1C) - POCT glucose (manual entry) - Amb ref to Medical Nutrition Therapy-MNT - metFORMIN (GLUCOPHAGE) 500 MG tablet; Take 1 tablet (500 mg total) by mouth daily for 14 days, THEN 1 tablet (500 mg total) 2 (two) times daily.  Dispense: 180 tablet; Refill: 3 - Microalbumin / creatinine urine ratio - Ambulatory referral to Ophthalmology  2. Hypertension associated with type 2 diabetes mellitus (HCC) At goal. Continue current meds.  If BP remains close to where it is today, may discuss decreasing dose of one of his meds in future - losartan (COZAAR) 100 MG tablet; Take 1 tablet (100 mg total) by mouth daily.  Dispense: 90 tablet; Refill: 1 - carvedilol (COREG) 25 MG tablet; Take 1 tablet (25 mg total) by mouth 2 (two) times daily with a meal.  Dispense: 180 tablet; Refill: 1 - amLODipine (NORVASC) 10 MG tablet; Take 1 tablet (10 mg total) by mouth daily.  Dispense: 90 tablet; Refill: 1 - spironolactone (ALDACTONE) 50 MG tablet; Take 1 tablet (50 mg total) by mouth daily.  Dispense: 90 tablet; Refill: 1  3. Chronic bilateral low back pain with bilateral sciatica Recommend instead of starting Gabapentin as 300 mg TID, I suggest starting with 300 mg at  bedtime for 1 week then increase to BID for 1 wk the TID - methocarbamol (ROBAXIN) 750 MG tablet; Take 1 tablet (750 mg total) by mouth 2 (two) times daily as needed for muscle spasms.  Dispense: 60 tablet; Refill: 1  4. Subclinical hyperthyroidism -back on lod dose Tapazole  5. History of gross hematuria Advise to make sure urologist is aware that he is back on Eliquis before any scheduling of cystoscopy.  6. Recurrent pulmonary emboli (HCC) 7. Recurrent deep vein thrombosis (DVT) (HCC) He has been restarted on Eliquis.   8. Pneumococcal vaccination declined  9. Screening for colon cancer -has stool kit. Encourage him to turn in ASAP.  Patient was given the opportunity to ask questions.  Patient verbalized understanding of the plan and was able to repeat key elements of the plan.   This documentation was completed using Paediatric nurse.  Any transcriptional errors are unintentional.  Orders Placed  This Encounter  Procedures   Microalbumin / creatinine urine ratio   Amb ref to Medical Nutrition Therapy-MNT   Ambulatory referral to Ophthalmology   POCT glycosylated hemoglobin (Hb A1C)   POCT glucose (manual entry)     Requested Prescriptions   Signed Prescriptions Disp Refills   losartan (COZAAR) 100 MG tablet 90 tablet 1    Sig: Take 1 tablet (100 mg total) by mouth daily.   carvedilol (COREG) 25 MG tablet 180 tablet 1    Sig: Take 1 tablet (25 mg total) by mouth 2 (two) times daily with a meal.   amLODipine (NORVASC) 10 MG tablet 90 tablet 1    Sig: Take 1 tablet (10 mg total) by mouth daily.   methocarbamol (ROBAXIN) 750 MG tablet 60 tablet 1    Sig: Take 1 tablet (750 mg total) by mouth 2 (two) times daily as needed for muscle spasms.   spironolactone (ALDACTONE) 50 MG tablet 90 tablet 1    Sig: Take 1 tablet (50 mg total) by mouth daily.   metFORMIN (GLUCOPHAGE) 500 MG tablet 180 tablet 3    Sig: Take 1 tablet (500 mg total) by mouth daily for 14  days, THEN 1 tablet (500 mg total) 2 (two) times daily.    Return in about 4 months (around 10/07/2023).  Jonah Blue, MD, FACP

## 2023-06-07 NOTE — Patient Instructions (Signed)
 VISIT SUMMARY:  [name], you had a follow-up appointment today to review your recent hospitalization for blood clots, your ongoing cancer surveillance, and other health concerns. We discussed your current medications, lifestyle changes, and upcoming procedures.  YOUR PLAN:  -RECURRENT DEEP VEIN THROMBOSIS (DVT) AND PULMONARY EMBOLISM (PE): You have a history of blood clots in your lungs and legs, which are being managed with lifelong anticoagulation due to your cancer risk. Continue taking Eliquis as prescribed and ensure you stay well-hydrated.  -NASOPHARYNGEAL CANCER: Your nasopharyngeal cancer is under active surveillance. Recent tests have been satisfactory. Continue with regular follow-ups with your oncologist.  -TYPE 2 DIABETES MELLITUS: Your recent A1c test indicates diabetes. We recommend lifestyle changes and have prescribed metformin. You should also monitor your blood glucose at home and follow up with a dietitian.  Try to check your blood sugars in the mornings before breakfast.  Goal is 90-130.  If you do check after a meal, it should be 2 hours after a meal and the goal is less than 180.  We have referred you for diabetic eye exam.  -HYPERTENSION: Your blood pressure is well-controlled with your current medications. Continue your current regimen, and we may adjust your carvedilol dosage if your blood pressure remains stable.  -HYPERTHYROIDISM: You have hyperthyroidism, which is being managed with methimazole. Continue taking your medication and follow up with your endocrinologist as scheduled.  -CHRONIC PAIN MANAGEMENT: You are scheduled for a sacroiliac joint injection and have been prescribed gabapentin for pain. Start taking gabapentin at bedtime and gradually increase the dose as tolerated. Confirm with your pain specialist about the safety of the injection while on Eliquis.  -GENERAL HEALTH MAINTENANCE: You are due for a pneumonia vaccine and colon cancer screening. Please complete  and submit your Cologuard test kit.  INSTRUCTIONS:  Please confirm the date and preparation for your sacroiliac joint injection. Ensure communication with your urologist regarding Eliquis management for your upcoming cystoscopy. Schedule a follow-up visit sooner than six months.

## 2023-06-08 ENCOUNTER — Encounter: Payer: Self-pay | Admitting: Internal Medicine

## 2023-06-08 MED ORDER — ACCU-CHEK SOFTCLIX LANCETS MISC
12 refills | Status: AC
  Filled 2023-06-08: qty 100, 100d supply, fill #0

## 2023-06-08 MED ORDER — ACCU-CHEK GUIDE W/DEVICE KIT
PACK | 0 refills | Status: AC
  Filled 2023-06-08: qty 1, 30d supply, fill #0

## 2023-06-08 MED ORDER — ACCU-CHEK GUIDE TEST VI STRP
ORAL_STRIP | 12 refills | Status: AC
  Filled 2023-06-08: qty 50, 50d supply, fill #0
  Filled 2023-07-29: qty 50, 50d supply, fill #1

## 2023-06-08 MED ORDER — METHIMAZOLE 5 MG PO TABS: 2.5000 mg | ORAL_TABLET | Freq: Every day | ORAL | Status: AC

## 2023-06-09 LAB — MICROALBUMIN / CREATININE URINE RATIO
Creatinine, Urine: 108.9 mg/dL
Microalb/Creat Ratio: 9 mg/g{creat} (ref 0–29)
Microalbumin, Urine: 9.8 ug/mL

## 2023-06-10 ENCOUNTER — Encounter: Payer: Self-pay | Admitting: Family Medicine

## 2023-06-10 ENCOUNTER — Other Ambulatory Visit: Payer: Self-pay

## 2023-06-12 ENCOUNTER — Other Ambulatory Visit: Payer: Self-pay

## 2023-06-12 ENCOUNTER — Telehealth: Admitting: Physician Assistant

## 2023-06-12 DIAGNOSIS — I82411 Acute embolism and thrombosis of right femoral vein: Secondary | ICD-10-CM | POA: Diagnosis not present

## 2023-06-12 NOTE — Progress Notes (Signed)
 Virtual Visit Consent   [name], you are scheduled for a virtual visit with a [facility] provider today. Just as with appointments in the office, your consent must be obtained to participate. Your consent will be active for this visit and any virtual visit you may have with one of our providers in the next 365 days. If you have a MyChart account, a copy of this consent can be sent to you electronically.  As this is a virtual visit, video technology does not allow for your provider to perform a traditional examination. This may limit your provider's ability to fully assess your condition. If your provider identifies any concerns that need to be evaluated in person or the need to arrange testing (such as labs, EKG, etc.), we will make arrangements to do so. Although advances in technology are sophisticated, we cannot ensure that it will always work on either your end or our end. If the connection with a video visit is poor, the visit may have to be switched to a telephone visit. With either a video or telephone visit, we are not always able to ensure that we have a secure connection.  By engaging in this virtual visit, you consent to the provision of healthcare and authorize for your insurance to be billed (if applicable) for the services provided during this visit. Depending on your insurance coverage, you may receive a charge related to this service.  I need to obtain your verbal consent now. Are you willing to proceed with your visit today? [name] has provided verbal consent on 06/12/2023 for a virtual visit (video or telephone). [name], [place]  Date: 06/12/2023 10:59 AM   Virtual Visit via Video Note   I, [name], connected with  COURTNEY FENLON  ([id number], 1975-09-22) on 06/12/23 at 10:45 AM EDT by a video-enabled telemedicine application and verified that I am speaking with the correct person using two identifiers.  Location: Patient: Virtual Visit  Location Patient: Home Provider: Virtual Visit Location Provider: Home Office   I discussed the limitations of evaluation and management by telemedicine and the availability of in person appointments. The patient expressed understanding and agreed to proceed.    History of Present Illness: EMIEL [name] is a 48 y.o. who identifies as a male who was assigned male at birth, and is being seen today for pain at site of DVT of R leg. 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  Recently seen by his Hematologist with no change in therapy. Notes pain present for < 1 day. Is not associated with swelling, redness of tenderness of the leg. More of an aching sensation. Denies chest pain or SOB. Works for [employer] so there is a lot of walking involved and he just did not feel he could work today. Is requesting work note.    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   Pulmonary embolism on right (HCC) 09/24/2022   Nasal cavity mass 09/24/2022   Acute deep vein thrombosis (DVT) of femoral vein of right lower extremity (HCC) 08/21/2022   Neck pain 08/14/2021   Prediabetes 01/28/2021   Vitamin D deficiency 01/28/2021   Essential hypertension 09/08/2020  Pre-ulcerative corn or callous 02/01/2019   Disorder of left Achilles tendon 02/01/2019   Inguinal hernia of left side without obstruction or gangrene 12/08/2016   Former tobacco use 12/08/2016   Localized swelling, mass and lump, multiple sites 12/08/2016   Subcutaneous nodules 07/09/2016   Periodontitis 05/27/2015   Pain in left wrist 05/27/2015   HTN (hypertension) 05/12/2015    Allergies:  Allergies  Allergen Reactions   Doxycycline Other (See Comments)     Possible allergen. Developed Bell's Palsy the day after taking antibiotic.   Penicillins Swelling and Rash   Medications:  Current Outpatient Medications:    Accu-Chek Softclix Lancets lancets, Use as instructed to check blood sugars once a day, Disp: 100 each, Rfl: 12   acetaminophen (TYLENOL) 500 MG tablet, Take 500 mg by mouth every 6 (six) hours as needed for moderate pain (pain score 4-6)., Disp: , Rfl:    amLODipine (NORVASC) 10 MG tablet, Take 1 tablet (10 mg total) by mouth daily., Disp: 90 tablet, Rfl: 1   apixaban (ELIQUIS) 5 MG TABS tablet, Take 1 tablet (5 mg total) by mouth 2 (two) times daily., Disp: 60 tablet, Rfl: 11   Blood Glucose Monitoring Suppl (ACCU-CHEK GUIDE) w/Device KIT, Use to check blood sugars once a day, Disp: 1 kit, Rfl: 0   budesonide (PULMICORT) 0.5 MG/2ML nebulizer solution, Mix 4 mLs (1 mg total/2 respules) itno 240 ml of nasal saline irrigation once daily., Disp: 120 mL, Rfl: 11   carvedilol (COREG) 25 MG tablet, Take 1 tablet (25 mg total) by mouth 2 (two) times daily with a meal., Disp: 180 tablet, Rfl: 1   gabapentin (NEURONTIN) 300 MG capsule, Take 300 mg by mouth 3 (three) times daily., Disp: , Rfl:    glucose blood (ACCU-CHEK GUIDE TEST) test strip, Use as instructed to check blood sugars once a day, Disp: 100 each, Rfl: 12   losartan (COZAAR) 100 MG tablet, Take 1 tablet (100 mg total) by mouth daily., Disp: 90 tablet, Rfl: 1   metFORMIN (GLUCOPHAGE) 500 MG tablet, Take 1 tablet (500 mg total) by mouth daily for 14 days, THEN 1 tablet (500 mg total) 2 (two) times daily., Disp: 180 tablet, Rfl: 3   methimazole (TAPAZOLE) 5 MG tablet, Take 0.5 tablets (2.5 mg total) by mouth daily., Disp: , Rfl:    methocarbamol (ROBAXIN) 750 MG tablet, Take 1 tablet (750 mg total) by mouth 2 (two) times daily as needed for muscle spasms., Disp: 60 tablet, Rfl: 1   polyethylene glycol powder (GLYCOLAX/MIRALAX) 17 GM/SCOOP powder, Take 17 g (1 capful) and mix in 8 oz of  clear liquid and drink by mouth daily., Disp: 238 g, Rfl: 0   sildenafil (VIAGRA) 100 MG tablet, Take 0.5-1 tablets (50-100 mg total) by mouth daily as needed for erectile dysfunction., Disp: 10 tablet, Rfl: 11   spironolactone (ALDACTONE) 50 MG tablet, Take 1 tablet (50 mg total) by mouth daily., Disp: 90 tablet, Rfl: 1  Observations/Objective: Patient is well-developed, well-nourished in no acute distress.  Resting comfortably at home.  Head is normocephalic, atraumatic.  No labored breathing. Speech is clear and coherent with logical content.  Patient is alert and oriented at baseline.  No noted leg swelling on video.   Assessment and Plan: 1. Acute deep vein thrombosis (DVT) of femoral vein of right lower extremity (HCC) (Primary)  Already diagnosed and being treated with anticoagulants. Denies chest pain, shortness of breath or swelling of leg. No overlying redness or tenderness to raise concern  for superficial thrombus or thrombophlebitis.  Recommend ice, rest and elevation. ES Tylenol OTC. He is to contact his specialist for follow-up. Strict ER precautions reviewed.   Follow Up Instructions: I discussed the assessment and treatment plan with the patient. The patient was provided an opportunity to ask questions and all were answered. The patient agreed with the plan and demonstrated an understanding of the instructions.  A copy of instructions were sent to the patient via MyChart unless otherwise noted below.   The patient was advised to call back or seek an in-person evaluation if the symptoms worsen or if the condition fails to improve as anticipated.    [name] Climes, PA-C

## 2023-06-12 NOTE — Patient Instructions (Signed)
 [name], thank you for joining [name], PA-C for today's virtual visit.  While this provider is not your primary care provider (PCP), if your PCP is located in our provider database this encounter information will be shared with them immediately following your visit.   A [facility] MyChart account gives you access to today's visit and all your visits, tests, and labs performed at [facility] " click here if you don't have a [place] [facility] MyChart account or go to [email or web address]  Consent: (Patient) [name] provided verbal consent for this virtual visit at the beginning of the encounter.  Current Medications:  Current Outpatient Medications:    Accu-Chek Softclix Lancets lancets, Use as instructed to check blood sugars once a day, Disp: 100 each, Rfl: 12   acetaminophen (TYLENOL) 500 MG tablet, Take 500 mg by mouth every 6 (six) hours as needed for moderate pain (pain score 4-6)., Disp: , Rfl:    amLODipine (NORVASC) 10 MG tablet, Take 1 tablet (10 mg total) by mouth daily., Disp: 90 tablet, Rfl: 1   apixaban (ELIQUIS) 5 MG TABS tablet, Take 1 tablet (5 mg total) by mouth 2 (two) times daily., Disp: 60 tablet, Rfl: 11   Blood Glucose Monitoring Suppl (ACCU-CHEK GUIDE) w/Device KIT, Use to check blood sugars once a day, Disp: 1 kit, Rfl: 0   budesonide (PULMICORT) 0.5 MG/2ML nebulizer solution, Mix 4 mLs (1 mg total/2 respules) itno 240 ml of nasal saline irrigation once daily., Disp: 120 mL, Rfl: 11   carvedilol (COREG) 25 MG tablet, Take 1 tablet (25 mg total) by mouth 2 (two) times daily with a meal., Disp: 180 tablet, Rfl: 1   gabapentin (NEURONTIN) 300 MG capsule, Take 300 mg by mouth 3 (three) times daily., Disp: , Rfl:    glucose blood (ACCU-CHEK GUIDE TEST) test strip, Use as instructed to check blood sugars once a day, Disp: 100 each, Rfl: 12   losartan (COZAAR) 100 MG tablet, Take 1 tablet (100 mg total) by mouth daily., Disp: 90 tablet,  Rfl: 1   metFORMIN (GLUCOPHAGE) 500 MG tablet, Take 1 tablet (500 mg total) by mouth daily for 14 days, THEN 1 tablet (500 mg total) 2 (two) times daily., Disp: 180 tablet, Rfl: 3   methimazole (TAPAZOLE) 5 MG tablet, Take 0.5 tablets (2.5 mg total) by mouth daily., Disp: , Rfl:    methocarbamol (ROBAXIN) 750 MG tablet, Take 1 tablet (750 mg total) by mouth 2 (two) times daily as needed for muscle spasms., Disp: 60 tablet, Rfl: 1   polyethylene glycol powder (GLYCOLAX/MIRALAX) 17 GM/SCOOP powder, Take 17 g (1 capful) and mix in 8 oz of clear liquid and drink by mouth daily., Disp: 238 g, Rfl: 0   sildenafil (VIAGRA) 100 MG tablet, Take 0.5-1 tablets (50-100 mg total) by mouth daily as needed for erectile dysfunction., Disp: 10 tablet, Rfl: 11   spironolactone (ALDACTONE) 50 MG tablet, Take 1 tablet (50 mg total) by mouth daily., Disp: 90 tablet, Rfl: 1   Medications ordered in this encounter:  No orders of the defined types were placed in this encounter.    *If you need refills on other medications prior to your next appointment, please contact your pharmacy*  Follow-Up: Call back or seek an in-person evaluation if the symptoms worsen or if the condition fails to improve as anticipated.  [facility] [phone]  Other Instructions Please elevate and ice the area. Tylenol OTC as discussed. Continue your Eliquis. Take  your Gabapentin as directed. If not improving or any progressing symptoms -- contact your specialist or be evaluated at nearest ER.   If you have been instructed to have an in-person evaluation today at a local Urgent Care facility, please use the link below. It will take you to a list of all of our available [place] [facility], including address, phone number and hours of operation. Please do not delay care.  [place]  If you or a family member do not have a primary care provider, use the link below to schedule a visit and establish  care. When you choose a [facility] primary care physician or advanced practice provider, you gain a long-term partner in health. Find a Primary Care Provider  Learn more about [facility]'s in-office and virtual care options: [place] - Get Care Now

## 2023-06-17 DIAGNOSIS — M533 Sacrococcygeal disorders, not elsewhere classified: Secondary | ICD-10-CM | POA: Diagnosis not present

## 2023-06-18 ENCOUNTER — Telehealth: Admitting: Physician Assistant

## 2023-06-18 DIAGNOSIS — G8918 Other acute postprocedural pain: Secondary | ICD-10-CM

## 2023-06-18 NOTE — Patient Instructions (Signed)
 [name], thank you for joining Margaretann Loveless, PA-C for today's virtual visit.  While this provider is not your primary care provider (PCP), if your PCP is located in our provider database this encounter information will be shared with them immediately following your visit.   A [facility] MyChart account gives you access to today's visit and all your visits, tests, and labs performed at [facility] " click here if you don't have a [facility] MyChart account or go to [email or web address]  Consent: (Patient) [name] provided verbal consent for this virtual visit at the beginning of the encounter.  Current Medications:  Current Outpatient Medications:    Accu-Chek Softclix Lancets lancets, Use as instructed to check blood sugars once a day, Disp: 100 each, Rfl: 12   acetaminophen (TYLENOL) 500 MG tablet, Take 500 mg by mouth every 6 (six) hours as needed for moderate pain (pain score 4-6)., Disp: , Rfl:    amLODipine (NORVASC) 10 MG tablet, Take 1 tablet (10 mg total) by mouth daily., Disp: 90 tablet, Rfl: 1   apixaban (ELIQUIS) 5 MG TABS tablet, Take 1 tablet (5 mg total) by mouth 2 (two) times daily., Disp: 60 tablet, Rfl: 11   Blood Glucose Monitoring Suppl (ACCU-CHEK GUIDE) w/Device KIT, Use to check blood sugars once a day, Disp: 1 kit, Rfl: 0   budesonide (PULMICORT) 0.5 MG/2ML nebulizer solution, Mix 4 mLs (1 mg total/2 respules) itno 240 ml of nasal saline irrigation once daily., Disp: 120 mL, Rfl: 11   carvedilol (COREG) 25 MG tablet, Take 1 tablet (25 mg total) by mouth 2 (two) times daily with a meal., Disp: 180 tablet, Rfl: 1   gabapentin (NEURONTIN) 300 MG capsule, Take 300 mg by mouth 3 (three) times daily., Disp: , Rfl:    glucose blood (ACCU-CHEK GUIDE TEST) test strip, Use as instructed to check blood sugars once a day, Disp: 100 each, Rfl: 12   losartan (COZAAR) 100 MG tablet, Take 1 tablet (100 mg total) by mouth daily., Disp: 90 tablet,  Rfl: 1   metFORMIN (GLUCOPHAGE) 500 MG tablet, Take 1 tablet (500 mg total) by mouth daily for 14 days, THEN 1 tablet (500 mg total) 2 (two) times daily., Disp: 180 tablet, Rfl: 3   methimazole (TAPAZOLE) 5 MG tablet, Take 0.5 tablets (2.5 mg total) by mouth daily., Disp: , Rfl:    methocarbamol (ROBAXIN) 750 MG tablet, Take 1 tablet (750 mg total) by mouth 2 (two) times daily as needed for muscle spasms., Disp: 60 tablet, Rfl: 1   polyethylene glycol powder (GLYCOLAX/MIRALAX) 17 GM/SCOOP powder, Take 17 g (1 capful) and mix in 8 oz of clear liquid and drink by mouth daily., Disp: 238 g, Rfl: 0   sildenafil (VIAGRA) 100 MG tablet, Take 0.5-1 tablets (50-100 mg total) by mouth daily as needed for erectile dysfunction., Disp: 10 tablet, Rfl: 11   spironolactone (ALDACTONE) 50 MG tablet, Take 1 tablet (50 mg total) by mouth daily., Disp: 90 tablet, Rfl: 1   Medications ordered in this encounter:  No orders of the defined types were placed in this encounter.    *If you need refills on other medications prior to your next appointment, please contact your pharmacy*  Follow-Up: Call back or seek an in-person evaluation if the symptoms worsen or if the condition fails to improve as anticipated.  [facility] [phone]  If you have been instructed to have an in-person evaluation today at a local Urgent Care  facility, please use the link below. It will take you to a list of all of our available [place] [place], including address, phone number and hours of operation. Please do not delay care.  [facility]  If you or a family member do not have a primary care provider, use the link below to schedule a visit and establish care. When you choose a [facility] primary care physician or advanced practice provider, you gain a long-term partner in health. Find a Primary Care Provider  Learn more about [facility]'s in-office and virtual care options: [place] - Get  Care Now

## 2023-06-18 NOTE — Progress Notes (Signed)
 Virtual Visit Consent   [name], you are scheduled for a virtual visit with a [facility] provider today. Just as with appointments in the office, your consent must be obtained to participate. Your consent will be active for this visit and any virtual visit you may have with one of our providers in the next 365 days. If you have a MyChart account, a copy of this consent can be sent to you electronically.  As this is a virtual visit, video technology does not allow for your provider to perform a traditional examination. This may limit your provider's ability to fully assess your condition. If your provider identifies any concerns that need to be evaluated in person or the need to arrange testing (such as labs, EKG, etc.), we will make arrangements to do so. Although advances in technology are sophisticated, we cannot ensure that it will always work on either your end or our end. If the connection with a video visit is poor, the visit may have to be switched to a telephone visit. With either a video or telephone visit, we are not always able to ensure that we have a secure connection.  By engaging in this virtual visit, you consent to the provision of healthcare and authorize for your insurance to be billed (if applicable) for the services provided during this visit. Depending on your insurance coverage, you may receive a charge related to this service.  I need to obtain your verbal consent now. Are you willing to proceed with your visit today? [name] has provided verbal consent on 06/18/2023 for a virtual visit (video or telephone). Margaretann Loveless, PA-C  Date: 06/18/2023 10:34 AM   Virtual Visit via Video Note   I, Margaretann Loveless, connected with  [name]  ([id number], [birth year]) on 06/18/23 at 10:30 AM EDT by a video-enabled telemedicine application and verified that I am speaking with the correct person using two identifiers.  Location: Patient: Virtual Visit  Location Patient: Home Provider: Virtual Visit Location Provider: Home Office   I discussed the limitations of evaluation and management by telemedicine and the availability of in person appointments. The patient expressed understanding and agreed to proceed.    History of Present Illness: [name] is a 48 y.o. who identifies as a male who was assigned male at birth, and is being seen today for pain following SI joint injection. Had injection yesterday at [facility]. They only provided a work note for yesterday. Today he awoke and is still having pain and swelling around injection site, so he took off and is in need of a work note. He has been applying ice to the area yesterday. He can start heat therapy today. Tylenol used as needed for breakthrough pain as well.    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   Pulmonary embolism on right (HCC) 09/24/2022   Nasal cavity mass 09/24/2022   Acute deep vein thrombosis (DVT) of femoral vein of right lower extremity (HCC) 08/21/2022   Neck pain 08/14/2021   Prediabetes 01/28/2021   Vitamin D deficiency 01/28/2021   Essential hypertension 09/08/2020   Pre-ulcerative corn or callous 02/01/2019   Disorder of left Achilles tendon 02/01/2019   Inguinal hernia of left side without obstruction or gangrene 12/08/2016  Former tobacco use 12/08/2016   Localized swelling, mass and lump, multiple sites 12/08/2016   Subcutaneous nodules 07/09/2016   Periodontitis 05/27/2015   Pain in left wrist 05/27/2015   HTN (hypertension) 05/12/2015    Allergies:  Allergies  Allergen Reactions   Doxycycline Other (See Comments)    Possible allergen. Developed Bell's Palsy the day after taking antibiotic.   Penicillins Swelling and Rash   Medications:   Current Outpatient Medications:    Accu-Chek Softclix Lancets lancets, Use as instructed to check blood sugars once a day, Disp: 100 each, Rfl: 12   acetaminophen (TYLENOL) 500 MG tablet, Take 500 mg by mouth every 6 (six) hours as needed for moderate pain (pain score 4-6)., Disp: , Rfl:    amLODipine (NORVASC) 10 MG tablet, Take 1 tablet (10 mg total) by mouth daily., Disp: 90 tablet, Rfl: 1   apixaban (ELIQUIS) 5 MG TABS tablet, Take 1 tablet (5 mg total) by mouth 2 (two) times daily., Disp: 60 tablet, Rfl: 11   Blood Glucose Monitoring Suppl (ACCU-CHEK GUIDE) w/Device KIT, Use to check blood sugars once a day, Disp: 1 kit, Rfl: 0   budesonide (PULMICORT) 0.5 MG/2ML nebulizer solution, Mix 4 mLs (1 mg total/2 respules) itno 240 ml of nasal saline irrigation once daily., Disp: 120 mL, Rfl: 11   carvedilol (COREG) 25 MG tablet, Take 1 tablet (25 mg total) by mouth 2 (two) times daily with a meal., Disp: 180 tablet, Rfl: 1   gabapentin (NEURONTIN) 300 MG capsule, Take 300 mg by mouth 3 (three) times daily., Disp: , Rfl:    glucose blood (ACCU-CHEK GUIDE TEST) test strip, Use as instructed to check blood sugars once a day, Disp: 100 each, Rfl: 12   losartan (COZAAR) 100 MG tablet, Take 1 tablet (100 mg total) by mouth daily., Disp: 90 tablet, Rfl: 1   metFORMIN (GLUCOPHAGE) 500 MG tablet, Take 1 tablet (500 mg total) by mouth daily for 14 days, THEN 1 tablet (500 mg total) 2 (two) times daily., Disp: 180 tablet, Rfl: 3   methimazole (TAPAZOLE) 5 MG tablet, Take 0.5 tablets (2.5 mg total) by mouth daily., Disp: , Rfl:    methocarbamol (ROBAXIN) 750 MG tablet, Take 1 tablet (750 mg total) by mouth 2 (two) times daily as needed for muscle spasms., Disp: 60 tablet, Rfl: 1   polyethylene glycol powder (GLYCOLAX/MIRALAX) 17 GM/SCOOP powder, Take 17 g (1 capful) and mix in 8 oz of clear liquid and drink by mouth daily., Disp: 238 g, Rfl: 0   sildenafil (VIAGRA) 100 MG tablet, Take 0.5-1 tablets (50-100 mg  total) by mouth daily as needed for erectile dysfunction., Disp: 10 tablet, Rfl: 11   spironolactone (ALDACTONE) 50 MG tablet, Take 1 tablet (50 mg total) by mouth daily., Disp: 90 tablet, Rfl: 1   Observations/Objective: Patient is well-developed, well-nourished in no acute distress.  Resting comfortably  at home.  Head is normocephalic, atraumatic.  No labored breathing.  Speech is clear and coherent with logical content.  Patient is alert and oriented at baseline.    Assessment and Plan: 1. Pain following surgery or procedure (Primary)  - Work note provided for patient for today - Heat - Light stretches - Follow up in-person if not improving or if worsening  Follow Up Instructions: I discussed the assessment and treatment plan with the patient. The patient was provided an opportunity to ask questions and all were answered. The patient agreed with the plan and demonstrated an understanding of the instructions.  A copy of instructions were sent to the patient via MyChart unless otherwise noted below.    The patient was advised to call back or seek an in-person evaluation if the symptoms worsen or if the condition fails to improve as anticipated.    Margaretann Loveless, PA-C

## 2023-06-20 ENCOUNTER — Encounter: Payer: Self-pay | Admitting: Internal Medicine

## 2023-06-24 ENCOUNTER — Telehealth: Payer: Self-pay | Admitting: Internal Medicine

## 2023-06-24 NOTE — Telephone Encounter (Signed)
 Called and spoke to the patient. Verified name & DOB. Patient stated that he is ok with lifting. He usually does not lift more than 75 lbs. He feels comfortable with that at this time. He is more concerned with the pulling / pushing.

## 2023-06-24 NOTE — Telephone Encounter (Signed)
 I received patient's form from his employer [employer].  Patient wrote a note on the form stating he is requesting accommodation of no pushing or pulling more than 35 to 40 pounds.  Before I complete this form, I also need to find out whether he also wanted a lifting restriction and if so, no more than how many pounds? I had sent him a Mychart message asking the same thing but has not heard back.

## 2023-06-26 ENCOUNTER — Telehealth: Payer: Self-pay | Admitting: Internal Medicine

## 2023-06-26 ENCOUNTER — Other Ambulatory Visit: Payer: Self-pay

## 2023-06-26 NOTE — Telephone Encounter (Signed)
 Copied from CRM [id number]. Topic: Clinical - Prescription Issue >> Jun 26, 2023  2:44 PM Everlene Hobby D wrote: Patient says he talked to the pharmacy for his - apixaban (ELIQUIS) 5 MG TABS tablet And was told he needs approval from doctor.

## 2023-06-26 NOTE — Telephone Encounter (Signed)
 Are we able to attempt a PA for this patient's Eliquis?

## 2023-06-26 NOTE — Telephone Encounter (Signed)
 Will forward to provider

## 2023-06-26 NOTE — Telephone Encounter (Signed)
Form ready to be faxed

## 2023-06-27 ENCOUNTER — Other Ambulatory Visit: Payer: Self-pay | Admitting: Internal Medicine

## 2023-06-27 ENCOUNTER — Other Ambulatory Visit: Payer: Self-pay

## 2023-06-27 MED ORDER — APIXABAN 5 MG PO TABS
5.0000 mg | ORAL_TABLET | Freq: Two times a day (BID) | ORAL | 11 refills | Status: AC
  Filled 2023-06-27 – 2023-08-21 (×5): qty 60, 30d supply, fill #0
  Filled 2023-10-14: qty 60, 30d supply, fill #1
  Filled 2023-12-04: qty 60, 30d supply, fill #2
  Filled 2024-01-15: qty 60, 30d supply, fill #3
  Filled 2024-01-17 – 2024-02-25 (×3): qty 60, 30d supply, fill #0

## 2023-06-27 NOTE — Telephone Encounter (Signed)
 Patient came in person and picked-up original form. Copy made and successfully faxed to employer [name]). Fax #: [phone].

## 2023-06-28 NOTE — Telephone Encounter (Signed)
 Opened in error

## 2023-07-02 ENCOUNTER — Telehealth: Admitting: Physician Assistant

## 2023-07-02 DIAGNOSIS — M7061 Trochanteric bursitis, right hip: Secondary | ICD-10-CM

## 2023-07-02 MED ORDER — PREDNISONE 10 MG (21) PO TBPK: ORAL_TABLET | ORAL | 0 refills | Status: DC

## 2023-07-02 NOTE — Patient Instructions (Signed)
 [name], thank you for joining Hyla Maillard, PA-C for today's virtual visit.  While this provider is not your primary care provider (PCP), if your PCP is located in our provider database this encounter information will be shared with them immediately following your visit.   A [facility] MyChart account gives you access to today's visit and all your visits, tests, and labs performed at [facility] " click here if you don't have a [facility] MyChart account or go to [email or web address]  Consent: (Patient) [name] provided verbal consent for this virtual visit at the beginning of the encounter.  Current Medications:  Current Outpatient Medications:    predniSONE  (STERAPRED UNI-PAK 21 TAB) 10 MG (21) TBPK tablet, Take following package directions, Disp: 21 tablet, Rfl: 0   Accu-Chek Softclix Lancets lancets, Use as instructed to check blood sugars once a day, Disp: 100 each, Rfl: 12   acetaminophen  (TYLENOL ) 500 MG tablet, Take 500 mg by mouth every 6 (six) hours as needed for moderate pain (pain score 4-6)., Disp: , Rfl:    amLODipine  (NORVASC ) 10 MG tablet, Take 1 tablet (10 mg total) by mouth daily., Disp: 90 tablet, Rfl: 1   apixaban  (ELIQUIS ) 5 MG TABS tablet, Take 1 tablet (5 mg total) by mouth 2 (two) times daily., Disp: 60 tablet, Rfl: 11   Blood Glucose Monitoring Suppl (ACCU-CHEK GUIDE) w/Device KIT, Use to check blood sugars once a day, Disp: 1 kit, Rfl: 0   budesonide  (PULMICORT ) 0.5 MG/2ML nebulizer solution, Mix 4 mLs (1 mg total/2 respules) itno 240 ml of nasal saline irrigation once daily., Disp: 120 mL, Rfl: 11   carvedilol  (COREG ) 25 MG tablet, Take 1 tablet (25 mg total) by mouth 2 (two) times daily with a meal., Disp: 180 tablet, Rfl: 1   gabapentin (NEURONTIN) 300 MG capsule, Take 300 mg by mouth 3 (three) times daily., Disp: , Rfl:    glucose blood (ACCU-CHEK GUIDE TEST) test strip, Use as instructed to check blood sugars once a day,  Disp: 100 each, Rfl: 12   losartan  (COZAAR ) 100 MG tablet, Take 1 tablet (100 mg total) by mouth daily., Disp: 90 tablet, Rfl: 1   metFORMIN  (GLUCOPHAGE ) 500 MG tablet, Take 1 tablet (500 mg total) by mouth daily for 14 days, THEN 1 tablet (500 mg total) 2 (two) times daily., Disp: 180 tablet, Rfl: 3   methimazole  (TAPAZOLE ) 5 MG tablet, Take 0.5 tablets (2.5 mg total) by mouth daily., Disp: , Rfl:    methocarbamol  (ROBAXIN ) 750 MG tablet, Take 1 tablet (750 mg total) by mouth 2 (two) times daily as needed for muscle spasms., Disp: 60 tablet, Rfl: 1   polyethylene glycol powder (GLYCOLAX /MIRALAX ) 17 GM/SCOOP powder, Take 17 g (1 capful) and mix in 8 oz of clear liquid and drink by mouth daily., Disp: 238 g, Rfl: 0   sildenafil  (VIAGRA ) 100 MG tablet, Take 0.5-1 tablets (50-100 mg total) by mouth daily as needed for erectile dysfunction., Disp: 10 tablet, Rfl: 11   spironolactone  (ALDACTONE ) 50 MG tablet, Take 1 tablet (50 mg total) by mouth daily., Disp: 90 tablet, Rfl: 1   Medications ordered in this encounter:  Meds ordered this encounter  Medications   predniSONE  (STERAPRED UNI-PAK 21 TAB) 10 MG (21) TBPK tablet    Sig: Take following package directions    Dispense:  21 tablet    Refill:  0    Supervising Provider:   Corine Dice [[id number]]     *If  you need refills on other medications prior to your next appointment, please contact your pharmacy*  Follow-Up: Call back or seek an in-person evaluation if the symptoms worsen or if the condition fails to improve as anticipated.  [facility] [phone]  Other Instructions Ok to continue your chronic medications. Consider heating pad to the ara in 10-15 minute intervals. Take the prednisone  pack as directed. Please call your PCP or specialist for ongoing management.  Take care!   If you have been instructed to have an in-person evaluation today at a local Urgent Care facility, please use the link below. It will  take you to a list of all of our available [facility], including address, phone number and hours of operation. Please do not delay care.  [facility]  If you or a family member do not have a primary care provider, use the link below to schedule a visit and establish care. When you choose a [facility] primary care physician or advanced practice provider, you gain a long-term partner in health. Find a Primary Care Provider  Learn more about 's in-office and virtual care options:  - Get Care Now

## 2023-07-02 NOTE — Progress Notes (Signed)
 Virtual Visit Consent   [name], you are scheduled for a virtual visit with a [facility] provider today. Just as with appointments in the office, your consent must be obtained to participate. Your consent will be active for this visit and any virtual visit you may have with one of our providers in the next 365 days. If you have a MyChart account, a copy of this consent can be sent to you electronically.  As this is a virtual visit, video technology does not allow for your provider to perform a traditional examination. This may limit your provider's ability to fully assess your condition. If your provider identifies any concerns that need to be evaluated in person or the need to arrange testing (such as labs, EKG, etc.), we will make arrangements to do so. Although advances in technology are sophisticated, we cannot ensure that it will always work on either your end or our end. If the connection with a video visit is poor, the visit may have to be switched to a telephone visit. With either a video or telephone visit, we are not always able to ensure that we have a secure connection.  By engaging in this virtual visit, you consent to the provision of healthcare and authorize for your insurance to be billed (if applicable) for the services provided during this visit. Depending on your insurance coverage, you may receive a charge related to this service.  I need to obtain your verbal consent now. Are you willing to proceed with your visit today? [name] has provided verbal consent on 07/02/2023 for a virtual visit (video or telephone). Hyla Maillard, [place]  Date: 07/02/2023 12:52 PM   Virtual Visit via Video Note   I, Hyla Maillard, connected with  [name]  ([id number], January 05, 1976) on 07/02/23 at 12:45 PM EDT by a video-enabled telemedicine application and verified that I am speaking with the correct person using two identifiers.  Location: Patient: Virtual Visit  Location Patient: Home Provider: Virtual Visit Location Provider: Home Office   I discussed the limitations of evaluation and management by telemedicine and the availability of in person appointments. The patient expressed understanding and agreed to proceed.    History of Present Illness: [name] is a 48 y.o. who identifies as a male who was assigned male at birth, and is being seen today for flare of pain from his hip bursitis over the past day. Notes is taking OTC Tylenol , his muscle relaxant and regular gabapentin which helps but still with breakthrough pain. Denies any known trigger for this as he was off yesterday and was able to rest. 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   Pulmonary embolism on right (HCC) 09/24/2022   Nasal cavity mass 09/24/2022   Acute deep vein thrombosis (DVT) of femoral vein of right lower extremity (HCC) 08/21/2022   Neck pain 08/14/2021   Prediabetes 01/28/2021   Vitamin D  deficiency 01/28/2021   Essential hypertension 09/08/2020   Pre-ulcerative corn or callous 02/01/2019   Disorder of left Achilles tendon 02/01/2019   Inguinal hernia of left side without obstruction or gangrene 12/08/2016   Former tobacco use 12/08/2016  Localized swelling, mass and lump, multiple sites 12/08/2016   Subcutaneous nodules 07/09/2016   Periodontitis 05/27/2015   Pain in left wrist 05/27/2015   HTN (hypertension) 05/12/2015    Allergies:  Allergies  Allergen Reactions   Doxycycline  Other (See Comments)    Possible allergen. Developed Bell's Palsy the day after taking antibiotic.   Penicillins Swelling and Rash   Medications:  Current Outpatient  Medications:    predniSONE  (STERAPRED UNI-PAK 21 TAB) 10 MG (21) TBPK tablet, Take following package directions, Disp: 21 tablet, Rfl: 0   Accu-Chek Softclix Lancets lancets, Use as instructed to check blood sugars once a day, Disp: 100 each, Rfl: 12   acetaminophen  (TYLENOL ) 500 MG tablet, Take 500 mg by mouth every 6 (six) hours as needed for moderate pain (pain score 4-6)., Disp: , Rfl:    amLODipine  (NORVASC ) 10 MG tablet, Take 1 tablet (10 mg total) by mouth daily., Disp: 90 tablet, Rfl: 1   apixaban  (ELIQUIS ) 5 MG TABS tablet, Take 1 tablet (5 mg total) by mouth 2 (two) times daily., Disp: 60 tablet, Rfl: 11   Blood Glucose Monitoring Suppl (ACCU-CHEK GUIDE) w/Device KIT, Use to check blood sugars once a day, Disp: 1 kit, Rfl: 0   budesonide  (PULMICORT ) 0.5 MG/2ML nebulizer solution, Mix 4 mLs (1 mg total/2 respules) itno 240 ml of nasal saline irrigation once daily., Disp: 120 mL, Rfl: 11   carvedilol  (COREG ) 25 MG tablet, Take 1 tablet (25 mg total) by mouth 2 (two) times daily with a meal., Disp: 180 tablet, Rfl: 1   gabapentin (NEURONTIN) 300 MG capsule, Take 300 mg by mouth 3 (three) times daily., Disp: , Rfl:    glucose blood (ACCU-CHEK GUIDE TEST) test strip, Use as instructed to check blood sugars once a day, Disp: 100 each, Rfl: 12   losartan  (COZAAR ) 100 MG tablet, Take 1 tablet (100 mg total) by mouth daily., Disp: 90 tablet, Rfl: 1   metFORMIN  (GLUCOPHAGE ) 500 MG tablet, Take 1 tablet (500 mg total) by mouth daily for 14 days, THEN 1 tablet (500 mg total) 2 (two) times daily., Disp: 180 tablet, Rfl: 3   methimazole  (TAPAZOLE ) 5 MG tablet, Take 0.5 tablets (2.5 mg total) by mouth daily., Disp: , Rfl:    methocarbamol  (ROBAXIN ) 750 MG tablet, Take 1 tablet (750 mg total) by mouth 2 (two) times daily as needed for muscle spasms., Disp: 60 tablet, Rfl: 1   polyethylene glycol powder (GLYCOLAX /MIRALAX ) 17 GM/SCOOP powder, Take 17 g (1 capful) and mix in 8 oz of clear liquid and drink by  mouth daily., Disp: 238 g, Rfl: 0   sildenafil  (VIAGRA ) 100 MG tablet, Take 0.5-1 tablets (50-100 mg total) by mouth daily as needed for erectile dysfunction., Disp: 10 tablet, Rfl: 11   spironolactone  (ALDACTONE ) 50 MG tablet, Take 1 tablet (50 mg total) by mouth daily., Disp: 90 tablet, Rfl: 1  Observations/Objective: Patient is well-developed, well-nourished in no acute distress.  Resting comfortably at home.  Head is normocephalic, atraumatic.  No labored breathing.  Speech is clear and coherent with logical content.  Patient is alert and oriented at baseline.   Assessment and Plan: 1. Trochanteric bursitis of right hip (Primary) - predniSONE  (STERAPRED UNI-PAK 21 TAB) 10 MG (21) TBPK tablet; Take following package directions  Dispense: 21 tablet; Refill: 0  Supportive measures and OTC medications reviewed. Can use heat in intervals as well. Start Sterapred pack. Needs in person follow-up with his specialist for ongoing management. Will allow work note  for today and tomorrow if needed. He is aware that no further work notes for this issue will be able to be given without evaluation with is PCP or specialist.   Follow Up Instructions: I discussed the assessment and treatment plan with the patient. The patient was provided an opportunity to ask questions and all were answered. The patient agreed with the plan and demonstrated an understanding of the instructions.  A copy of instructions were sent to the patient via MyChart unless otherwise noted below.   The patient was advised to call back or seek an in-person evaluation if the symptoms worsen or if the condition fails to improve as anticipated.    Hyla Maillard, PA-C

## 2023-07-15 ENCOUNTER — Other Ambulatory Visit: Payer: Self-pay

## 2023-07-15 ENCOUNTER — Other Ambulatory Visit: Payer: Self-pay | Admitting: Internal Medicine

## 2023-07-15 ENCOUNTER — Ambulatory Visit: Payer: Self-pay

## 2023-07-15 NOTE — Telephone Encounter (Signed)
 Copied from CRM [id number]. Topic: Clinical - Medication Refill >> Jul 15, 2023  4:59 PM Stanly Early wrote: Most Recent Primary Care Visit:  Provider: Concetta Dee B  Department: [facility]  Visit Type: OFFICE VISIT  Date: 06/07/2023  Medication: clindamycin  (CLEOCIN ) 150 MG capsule  Has the patient contacted their pharmacy? Yes (Agent: If no, request that the patient contact the pharmacy for the refill. If patient does not wish to contact the pharmacy document the reason why and proceed with request.) (Agent: If yes, when and what did the pharmacy advise?)  Is this the correct pharmacy for this prescription? Yes If no, delete pharmacy and type the correct one.  This is the patient's preferred pharmacy:  [facility]  [address] [address] [phone]   Has the prescription been filled recently? No  Is the patient out of the medication? Yes  Has the patient been seen for an appointment in the last year OR does the patient have an upcoming appointment? Yes  Can we respond through MyChart? Yes  Agent: Please be advised that Rx refills may take up to 3 business days. We ask that you follow-up with your pharmacy.

## 2023-07-16 ENCOUNTER — Ambulatory Visit: Payer: Self-pay

## 2023-07-16 ENCOUNTER — Encounter (HOSPITAL_BASED_OUTPATIENT_CLINIC_OR_DEPARTMENT_OTHER): Payer: Self-pay

## 2023-07-16 ENCOUNTER — Emergency Department (HOSPITAL_BASED_OUTPATIENT_CLINIC_OR_DEPARTMENT_OTHER)
Admission: EM | Admit: 2023-07-16 | Discharge: 2023-07-17 | Disposition: A | Discharge status: Home / Self Care | Attending: Emergency Medicine | Admitting: Emergency Medicine

## 2023-07-16 ENCOUNTER — Other Ambulatory Visit: Payer: Self-pay

## 2023-07-16 DIAGNOSIS — Z7901 Long term (current) use of anticoagulants: Secondary | ICD-10-CM | POA: Insufficient documentation

## 2023-07-16 DIAGNOSIS — K0889 Other specified disorders of teeth and supporting structures: Secondary | ICD-10-CM | POA: Diagnosis not present

## 2023-07-16 DIAGNOSIS — Z85830 Personal history of malignant neoplasm of bone: Secondary | ICD-10-CM | POA: Insufficient documentation

## 2023-07-16 HISTORY — DX: Acute embolism and thrombosis of unspecified deep veins of unspecified lower extremity: I82.409

## 2023-07-16 NOTE — ED Triage Notes (Addendum)
 Pt states he is having dental pain, upper and lower on rt. Side neoplasma spindal cell sarcoma (excision in 11/2022)

## 2023-07-16 NOTE — Telephone Encounter (Signed)
 Routing to PCP for review.

## 2023-07-16 NOTE — Telephone Encounter (Signed)
  Chief Complaint: Antibiotic rx request Symptoms: gum pain and injury Frequency: with injury Pertinent Negatives: Patient denies fever  Disposition: [] ED /[] Urgent Care (no appt availability in office) / [] Appointment(In office/virtual)/ []  [facility]/ [] Home Care/ [x] Refused Recommended Disposition /[x] [facility]/ []  Follow-up with PCP Additional Notes:  Requesting antibiotics to be called into pharmacy. "Dr. Lincoln Renshaw knows what I am dealing with, I requested refill in the past and she would just send it in".  Injured gum while eating taco salad, a chip stuck into gum causing swelling to right side upper and lower gums. Has dental appointment 07/17/23, this will be his establishing appointment. Acute evaluation advised, none available in practice location, suggested mobile bus patient declined and requests message be sent to Dr. Lincoln Renshaw requesting rx for clindamycin . Please follow up with Athena Bland per his request [phone]. Educated on care advice as documented in protocol, patient verbalized understanding.    Message from [place] T sent at 07/16/2023 12:14 PM EDT  Copied From CRM [id number]. Reason for Triage: clindamycin  (CLEOCIN ) 300 MG capsule- still waiting for refill, mouth is swollen and hard to talk, patient states he was eating taco salad and a chip stabbed his gums which caused the problem-   [phone]   Reason for Disposition  [1] Looks infected (increasing pain, redness or swelling after 48 hrs) AND [2] no fever  Protocols used: Mouth Injury-A-AH

## 2023-07-17 ENCOUNTER — Other Ambulatory Visit: Payer: Self-pay

## 2023-07-17 MED ORDER — HYDROCODONE-ACETAMINOPHEN 5-325 MG PO TABS: 1.0000 | ORAL_TABLET | Freq: Four times a day (QID) | ORAL | 0 refills | Status: DC | PRN

## 2023-07-17 MED ORDER — CLINDAMYCIN HCL 150 MG PO CAPS: 300.0000 mg | ORAL_CAPSULE | Freq: Four times a day (QID) | ORAL | 0 refills | Status: DC

## 2023-07-17 MED ORDER — CLINDAMYCIN HCL 150 MG PO CAPS
300.0000 mg | ORAL_CAPSULE | Freq: Once | ORAL | Status: AC
  Administered 2023-07-17: 300 mg via ORAL
  Filled 2023-07-17: qty 2

## 2023-07-17 MED ORDER — CLINDAMYCIN HCL 300 MG PO CAPS
300.0000 mg | ORAL_CAPSULE | Freq: Three times a day (TID) | ORAL | 0 refills | Status: AC
  Filled 2023-07-17: qty 30, 10d supply, fill #0

## 2023-07-17 MED ORDER — HYDROCODONE-ACETAMINOPHEN 5-325 MG PO TABS
2.0000 | ORAL_TABLET | Freq: Once | ORAL | Status: AC
  Administered 2023-07-17: 2 via ORAL
  Filled 2023-07-17: qty 2

## 2023-07-17 NOTE — Telephone Encounter (Signed)
 Already ordered within a different encounter

## 2023-07-17 NOTE — ED Provider Notes (Signed)
 [facility]  Provider Note  CSN: [id number] Arrival date & time: 07/16/23 2140  History Chief Complaint  Patient presents with   Dental Pain    [name] is a 48 y.o. 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 He is scheduled to see a dentist tomorrow but could not tolerate the pain. He has not had fever, drainage or bleeding. 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    Home Medications Prior to Admission medications   Medication Sig Start Date End Date Taking? Authorizing Provider  clindamycin  (CLEOCIN ) 150 MG capsule Take 2 capsules (300 mg total) by mouth 4 (four) times daily. 07/17/23  Yes Charmayne Cooper, MD  HYDROcodone -acetaminophen  (NORCO/VICODIN) 5-325 MG tablet Take 1-2 tablets by mouth every 6 (six) hours as needed for severe pain (pain score 7-10). 07/17/23  Yes Charmayne Cooper, MD  Accu-Chek Softclix Lancets lancets Use as instructed to check blood sugars once a day 06/08/23   Lawrance Presume, MD  acetaminophen  (TYLENOL ) 500 MG tablet Take 500 mg by mouth every 6 (six) hours as needed for moderate pain (pain score 4-6).    [provider]  amLODipine  (NORVASC ) 10 MG tablet Take 1 tablet (10 mg total) by mouth daily. 06/07/23   Lawrance Presume, MD  apixaban  (ELIQUIS ) 5 MG TABS tablet Take 1 tablet (5 mg total) by mouth 2 (two) times daily. 06/27/23   Lawrance Presume, MD  Blood Glucose Monitoring Suppl (ACCU-CHEK GUIDE) w/Device KIT Use to check blood sugars once a day 06/08/23   Lawrance Presume, MD  budesonide  (PULMICORT ) 0.5 MG/2ML nebulizer solution Mix 4 mLs (1 mg total/2 respules) itno 240 ml of nasal saline irrigation once daily. 01/09/23     carvedilol  (COREG ) 25 MG tablet Take 1 tablet (25 mg total) by mouth 2 (two)  times daily with a meal. 06/07/23   Lawrance Presume, MD  gabapentin (NEURONTIN) 300 MG capsule Take 300 mg by mouth 3 (three) times daily. 05/23/23 07/22/23  [provider]  glucose blood (ACCU-CHEK GUIDE TEST) test strip Use as instructed to check blood sugars once a day 06/08/23   Lawrance Presume, MD  losartan  (COZAAR ) 100 MG tablet Take 1 tablet (100 mg total) by mouth daily. 06/07/23   Lawrance Presume, MD  metFORMIN  (GLUCOPHAGE ) 500 MG tablet Take 1 tablet (500 mg total) by mouth daily for 14 days, THEN 1 tablet (500 mg total) 2 (two) times daily. 06/07/23 09/12/23  Lawrance Presume, MD  methimazole  (TAPAZOLE ) 5 MG tablet Take 0.5 tablets (2.5 mg total) by mouth daily. 06/08/23   Lawrance Presume, MD  methocarbamol  (ROBAXIN ) 750 MG tablet Take 1 tablet (750 mg total) by mouth 2 (two) times daily as needed for muscle spasms. 06/07/23   Lawrance Presume, MD  polyethylene glycol powder (GLYCOLAX /MIRALAX ) 17 GM/SCOOP powder Take 17 g (1 capful) and mix in 8 oz of clear liquid and drink by mouth daily. 05/18/23   Akula, Vijaya, MD  predniSONE  (STERAPRED UNI-PAK 21 TAB) 10 MG (21) TBPK tablet Take following package directions 07/02/23   Farris Hong, PA-C  sildenafil  (VIAGRA ) 100 MG tablet Take 0.5-1 tablets (50-100 mg total) by mouth daily as needed for erectile dysfunction. 04/20/22   Fleming, Zelda W,  NP  spironolactone  (ALDACTONE ) 50 MG tablet Take 1 tablet (50 mg total) by mouth daily. 06/07/23   Lawrance Presume, MD     Allergies    Doxycycline  and Penicillins   Review of Systems   Review of Systems Please see HPI for pertinent positives and negatives  Physical Exam BP (!) 174/114 (BP Location: Left Arm)   Pulse 78   Temp 97.7 F (36.5 C)   Resp 20   Ht 6\' 2"  (1.88 m)   Wt 97.1 kg   SpO2 95%   BMI 27.48 kg/m   Physical Exam Vitals and nursing note reviewed.  HENT:     Head: Normocephalic.     Nose: Nose normal.     Mouth/Throat:     Comments: Poor dentition  with several teeth eroded to gumline. No focal fluctuance or drainage. Diffuse tenderness to palpation with erythema and induration of the gums. No sinus tenderness Eyes:     Extraocular Movements: Extraocular movements intact.  Pulmonary:     Effort: Pulmonary effort is normal.  Musculoskeletal:        General: Normal range of motion.     Cervical back: Neck supple.  Skin:    Findings: No rash (on exposed skin).  Neurological:     Mental Status: He is alert and oriented to person, place, and time.  Psychiatric:        Mood and Affect: Mood normal.     ED Results / Procedures / Treatments   EKG None  Procedures Procedures  Medications Ordered in the ED Medications  HYDROcodone -acetaminophen  (NORCO/VICODIN) 5-325 MG per tablet 2 tablet (has no administration in time range)  clindamycin  (CLEOCIN ) capsule 300 mg (has no administration in time range)    Initial Impression and Plan  Patient here with dental pain/infection. No signs that this is related to prior sarcoma, recent CT showed no residual disease. No signs of deep tissue infection. Will give pain medications, Abx and recommend dental follow up as scheduled.   ED Course       MDM Rules/Calculators/A&P Medical Decision Making Problems Addressed: Pain, dental: acute illness or injury  Risk Prescription drug management.     Final Clinical Impression(s) / ED Diagnoses Final diagnoses:  Pain, dental    Rx / DC Orders ED Discharge Orders          Ordered    HYDROcodone -acetaminophen  (NORCO/VICODIN) 5-325 MG tablet  Every 6 hours PRN        07/17/23 0042    clindamycin  (CLEOCIN ) 150 MG capsule  4 times daily        07/17/23 0042             Charmayne Cooper, MD 07/17/23 [phone]

## 2023-07-17 NOTE — ED Notes (Signed)
 Did ask EDP if labs needed to be obtained and he would like to see him 1st

## 2023-07-17 NOTE — Telephone Encounter (Signed)
 Chart reviewed. Pt already seen in ER.

## 2023-07-18 ENCOUNTER — Telehealth: Admitting: Physician Assistant

## 2023-07-18 ENCOUNTER — Emergency Department (HOSPITAL_COMMUNITY)
Admission: EM | Admit: 2023-07-18 | Discharge: 2023-07-18 | Disposition: A | Discharge status: Home / Self Care | Attending: Emergency Medicine | Admitting: Emergency Medicine

## 2023-07-18 DIAGNOSIS — K047 Periapical abscess without sinus: Secondary | ICD-10-CM

## 2023-07-18 DIAGNOSIS — Z7901 Long term (current) use of anticoagulants: Secondary | ICD-10-CM | POA: Diagnosis not present

## 2023-07-18 DIAGNOSIS — K0889 Other specified disorders of teeth and supporting structures: Secondary | ICD-10-CM | POA: Diagnosis present

## 2023-07-18 MED ORDER — OXYCODONE-ACETAMINOPHEN 5-325 MG PO TABS
1.0000 | ORAL_TABLET | Freq: Once | ORAL | Status: AC
  Administered 2023-07-18: 1 via ORAL
  Filled 2023-07-18: qty 1

## 2023-07-18 MED ORDER — KETOROLAC TROMETHAMINE 15 MG/ML IJ SOLN
15.0000 mg | Freq: Once | INTRAMUSCULAR | Status: AC
  Administered 2023-07-18: 15 mg via INTRAMUSCULAR
  Filled 2023-07-18: qty 1

## 2023-07-18 NOTE — ED Provider Notes (Signed)
 [place] EMERGENCY DEPARTMENT AT [facility] Provider Note   CSN: [id number] Arrival date & time: 07/18/23  1248     History  Chief Complaint  Patient presents with   Dental Pain    [name] is a 47 y.o. male.  48 year old male with left upper dental pain and swelling. Seen in the ER 2 days ago after pain eating hamberger helper chips, started on Clinda and Norco at that time. States no improvement in pain after 48 hours of abx, called nurse line who recommended he come back to the ER for IV antibiotics.  Complex medical history, not currently on chemo.  Scheduled to see the dentist yesterday but canceled the appointment due to pain and swelling not improved, felt like this would prevent the dentist from treating him.        Home Medications Prior to Admission medications   Medication Sig Start Date End Date Taking? Authorizing Provider  Accu-Chek Softclix Lancets lancets Use as instructed to check blood sugars once a day 06/08/23   Lawrance Presume, MD  acetaminophen  (TYLENOL ) 500 MG tablet Take 500 mg by mouth every 6 (six) hours as needed for moderate pain (pain score 4-6).    [provider]  amLODipine  (NORVASC ) 10 MG tablet Take 1 tablet (10 mg total) by mouth daily. 06/07/23   Lawrance Presume, MD  apixaban  (ELIQUIS ) 5 MG TABS tablet Take 1 tablet (5 mg total) by mouth 2 (two) times daily. 06/27/23   Lawrance Presume, MD  Blood Glucose Monitoring Suppl (ACCU-CHEK GUIDE) w/Device KIT Use to check blood sugars once a day 06/08/23   Lawrance Presume, MD  budesonide  (PULMICORT ) 0.5 MG/2ML nebulizer solution Mix 4 mLs (1 mg total/2 respules) itno 240 ml of nasal saline irrigation once daily. 01/09/23     carvedilol  (COREG ) 25 MG tablet Take 1 tablet (25 mg total) by mouth 2 (two) times daily with a meal. 06/07/23   Lawrance Presume, MD  clindamycin  (CLEOCIN ) 150 MG capsule Take 2 capsules (300 mg total) by mouth 4 (four) times daily. 07/17/23    Charmayne Cooper, MD  clindamycin  (CLEOCIN ) 300 MG capsule Take 1 capsule (300 mg total) by mouth 3 (three) times daily for 10 days. 07/17/23 07/27/23  Lawrance Presume, MD  gabapentin (NEURONTIN) 300 MG capsule Take 300 mg by mouth 3 (three) times daily. 05/23/23 07/22/23  [provider]  glucose blood (ACCU-CHEK GUIDE TEST) test strip Use as instructed to check blood sugars once a day 06/08/23   Lawrance Presume, MD  HYDROcodone -acetaminophen  (NORCO/VICODIN) 5-325 MG tablet Take 1-2 tablets by mouth every 6 (six) hours as needed for severe pain (pain score 7-10). 07/17/23   Charmayne Cooper, MD  losartan  (COZAAR ) 100 MG tablet Take 1 tablet (100 mg total) by mouth daily. 06/07/23   Lawrance Presume, MD  metFORMIN  (GLUCOPHAGE ) 500 MG tablet Take 1 tablet (500 mg total) by mouth daily for 14 days, THEN 1 tablet (500 mg total) 2 (two) times daily. 06/07/23 09/12/23  Lawrance Presume, MD  methimazole  (TAPAZOLE ) 5 MG tablet Take 0.5 tablets (2.5 mg total) by mouth daily. 06/08/23   Lawrance Presume, MD  methocarbamol  (ROBAXIN ) 750 MG tablet Take 1 tablet (750 mg total) by mouth 2 (two) times daily as needed for muscle spasms. 06/07/23   Lawrance Presume, MD  polyethylene glycol powder (GLYCOLAX /MIRALAX ) 17 GM/SCOOP powder Take 17 g (1 capful) and mix in 8 oz  of clear liquid and drink by mouth daily. 05/18/23   Akula, Vijaya, MD  predniSONE  (STERAPRED UNI-PAK 21 TAB) 10 MG (21) TBPK tablet Take following package directions 07/02/23   Farris Hong, PA-C  sildenafil  (VIAGRA ) 100 MG tablet Take 0.5-1 tablets (50-100 mg total) by mouth daily as needed for erectile dysfunction. 04/20/22   Fleming, Zelda W, NP  spironolactone  (ALDACTONE ) 50 MG tablet Take 1 tablet (50 mg total) by mouth daily. 06/07/23   Lawrance Presume, MD      Allergies    Doxycycline  and Penicillins    Review of Systems   Review of Systems Negative except as per HPI Physical Exam Updated Vital Signs BP (!) 148/105 (BP  Location: Right Arm)   Pulse 81   Temp 98.1 F (36.7 C) (Oral)   Resp 18   SpO2 99%  Physical Exam Vitals and nursing note reviewed.  Constitutional:      General: He is not in acute distress.    Appearance: He is well-developed. He is not diaphoretic.  HENT:     Head: Normocephalic and atraumatic.     Jaw: No trismus or pain on movement.     Nose: Nose normal.     Mouth/Throat:     Mouth: Mucous membranes are moist.     Pharynx: No oropharyngeal exudate or posterior oropharyngeal erythema.      Comments: Missing multiple teeth. Near area of right upper lateral incisor, swelling to the gingiva which could be dental abscess, no active drainage.  Eyes:     Conjunctiva/sclera: Conjunctivae normal.  Pulmonary:     Effort: Pulmonary effort is normal.  Musculoskeletal:     Cervical back: Neck supple. No tenderness.  Lymphadenopathy:     Cervical: No cervical adenopathy.  Skin:    General: Skin is warm and dry.  Neurological:     Mental Status: He is alert and oriented to person, place, and time.  Psychiatric:        Behavior: Behavior normal.     ED Results / Procedures / Treatments   Labs (all labs ordered are listed, but only abnormal results are displayed) Labs Reviewed - No data to display  EKG None  Radiology No results found.  Procedures Procedures    Medications Ordered in ED Medications  ketorolac  (TORADOL ) 15 MG/ML injection 15 mg (15 mg Intramuscular Given 07/18/23 1414)  oxyCODONE -acetaminophen  (PERCOCET/ROXICET) 5-325 MG per tablet 1 tablet (1 tablet Oral Given 07/18/23 1414)    ED Course/ Medical Decision Making/ A&P                                 Medical Decision Making Risk Prescription drug management.   48 year old male with complaint of dental pain, not relieved with Clindamycin  x 48 hours with norco. Missed DDS apt yesterday. Here with concern for persistent pain. No trismus, no fever, no significant swelling. Does appear to be developing  right upper dental abscess. Provided with IM toradol , PO percocet, topical lidocaine  jelly. Pt is tolerating POs. Will dc to continue with meds previously prescribed. See DDS asap. Given lidocaine  jelly. Recommend Advil  Liquid Gel 600mg  with Tylenol  650mg  every 6 hours. Replace the Tylenol  with his Norco if needed.         Final Clinical Impression(s) / ED Diagnoses Final diagnoses:  Dental abscess    Rx / DC Orders ED Discharge Orders     None  Darlis Eisenmenger, PA-C 07/18/23 1438    Teddi Favors, DO 07/19/23 [phone]

## 2023-07-18 NOTE — Discharge Instructions (Addendum)
 Continue with the Clindamycin  as prescribed.  Rinse with Listerine after every meal. Take Advil  Liquid Gel 600mg  every 6 hours for pain. Take 650mg  Tylenol  every 6 hours for pain.  If the Advil  Liquid Gel and Tylenol  combination is not helping with the pain- take your Norco as previously prescribed in place of the Tylenol .  You can apply the lolicaine gel to the gum for pain as well.  Follow up with a dentist as soon as possible.

## 2023-07-18 NOTE — Progress Notes (Signed)
 Virtual Visit Consent   [name], you are scheduled for a virtual visit with a [facility] provider today. Just as with appointments in the office, your consent must be obtained to participate. Your consent will be active for this visit and any virtual visit you may have with one of our providers in the next 365 days. If you have a MyChart account, a copy of this consent can be sent to you electronically.  As this is a virtual visit, video technology does not allow for your provider to perform a traditional examination. This may limit your provider's ability to fully assess your condition. If your provider identifies any concerns that need to be evaluated in person or the need to arrange testing (such as labs, EKG, etc.), we will make arrangements to do so. Although advances in technology are sophisticated, we cannot ensure that it will always work on either your end or our end. If the connection with a video visit is poor, the visit may have to be switched to a telephone visit. With either a video or telephone visit, we are not always able to ensure that we have a secure connection.  By engaging in this virtual visit, you consent to the provision of healthcare and authorize for your insurance to be billed (if applicable) for the services provided during this visit. Depending on your insurance coverage, you may receive a charge related to this service.  I need to obtain your verbal consent now. Are you willing to proceed with your visit today? [name] has provided verbal consent on 07/18/2023 for a virtual visit (video or telephone). Angelia Kelp, PA-C  Date: 07/18/2023 12:42 PM   Virtual Visit via Video Note   I, Angelia Kelp, connected with  [name]  ([id number], Jul 27, 1975) on 07/18/23 at 12:15 PM EDT by a video-enabled telemedicine application and verified that I am speaking with the correct person using two identifiers.  Location: Patient: Virtual Visit  Location Patient: Home Provider: Virtual Visit Location Provider: Home Office   I discussed the limitations of evaluation and management by telemedicine and the availability of in person appointments. The patient expressed understanding and agreed to proceed.    History of Present Illness: [name] is a 48 y.o. who identifies as a male who was assigned male at birth, and is being seen today for continued dental pain, swelling, and feverish symptoms with dental infection.  HPI: Dental Pain  This is a new problem. The current episode started in the past 7 days (Seen at ER on 07/16/23 for same issue and started on Hydrocodone -APAP 5-325mg  and Clindamycin  600mg  QID; reports had been eating hamburger helper and he took a bite that had a chip in it and he felt it either go in his gum or cut his gum). The problem occurs constantly. The problem has been gradually worsening (Still having severe pain, nausea, decreased intake, subjective fevers, and mild facial swelling that is new today). The pain is severe. Associated symptoms include facial pain, a fever (subjective) and thermal sensitivity. Pertinent negatives include no difficulty swallowing, oral bleeding or sinus pressure. Treatments tried: Clindamycin  600mg  QID, Hydrocodone -APAP 5-325mg , salt water gargles. The treatment provided no relief.   Had a dental appt on 07/17/23, but cancelled it due to lack of improvement in symptoms feeling dentist would not do anything since still so inflamed.  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   Pulmonary embolism on right (HCC) 09/24/2022   Nasal cavity mass 09/24/2022   Acute deep vein thrombosis (DVT) of femoral vein of right lower extremity (HCC) 08/21/2022   Neck pain 08/14/2021   Prediabetes  01/28/2021   Vitamin D  deficiency 01/28/2021   Essential hypertension 09/08/2020   Pre-ulcerative corn or callous 02/01/2019   Disorder of left Achilles tendon 02/01/2019   Inguinal hernia of left side without obstruction or gangrene 12/08/2016   Former tobacco use 12/08/2016   Localized swelling, mass and lump, multiple sites 12/08/2016   Subcutaneous nodules 07/09/2016   Periodontitis 05/27/2015   Pain in left wrist 05/27/2015   HTN (hypertension) 05/12/2015    Allergies:  Allergies  Allergen Reactions   Doxycycline  Other (See Comments)    Possible allergen. Developed Bell's Palsy the day after taking antibiotic.   Penicillins Swelling and Rash   Medications:  Current Outpatient Medications:    Accu-Chek Softclix Lancets lancets, Use as instructed to check blood sugars once a day, Disp: 100 each, Rfl: 12   acetaminophen  (TYLENOL ) 500 MG tablet, Take 500 mg by mouth every 6 (six) hours as needed for moderate pain (pain score 4-6)., Disp: , Rfl:    amLODipine  (NORVASC ) 10 MG tablet, Take 1 tablet (10 mg total) by mouth daily., Disp: 90 tablet, Rfl: 1   apixaban  (ELIQUIS ) 5 MG TABS tablet, Take 1 tablet (5 mg total) by mouth 2 (two) times daily., Disp: 60 tablet, Rfl: 11   Blood Glucose Monitoring Suppl (ACCU-CHEK GUIDE) w/Device KIT, Use to check blood sugars once a day, Disp: 1 kit, Rfl: 0   budesonide  (PULMICORT ) 0.5 MG/2ML nebulizer solution, Mix 4 mLs (1 mg total/2 respules) itno 240 ml of nasal saline irrigation once daily., Disp: 120 mL, Rfl: 11   carvedilol  (COREG ) 25 MG tablet, Take 1 tablet (25 mg total) by mouth 2 (two) times daily with a meal., Disp: 180 tablet, Rfl: 1   clindamycin  (CLEOCIN ) 150 MG capsule, Take 2 capsules (300 mg total) by mouth 4 (four) times daily., Disp: 56 capsule, Rfl: 0   clindamycin  (CLEOCIN ) 300 MG capsule, Take 1 capsule (300 mg total) by mouth 3 (three) times daily for 10 days., Disp: 30 capsule, Rfl: 0   gabapentin (NEURONTIN) 300 MG capsule,  Take 300 mg by mouth 3 (three) times daily., Disp: , Rfl:    glucose blood (ACCU-CHEK GUIDE TEST) test strip, Use as instructed to check blood sugars once a day, Disp: 100 each, Rfl: 12   HYDROcodone -acetaminophen  (NORCO/VICODIN) 5-325 MG tablet, Take 1-2 tablets by mouth every 6 (six) hours as needed for severe pain (pain score 7-10)., Disp: 12 tablet, Rfl: 0   losartan  (COZAAR ) 100 MG tablet, Take 1 tablet (100 mg total) by mouth daily., Disp: 90 tablet, Rfl: 1   metFORMIN  (GLUCOPHAGE ) 500 MG tablet, Take 1 tablet (500 mg total) by mouth daily for 14 days, THEN 1 tablet (500 mg total) 2 (two) times daily., Disp: 180 tablet, Rfl: 3   methimazole  (TAPAZOLE ) 5 MG tablet, Take 0.5 tablets (2.5 mg total) by mouth daily., Disp: , Rfl:    methocarbamol  (ROBAXIN ) 750 MG tablet, Take 1 tablet (750 mg total) by mouth 2 (two) times daily as needed for muscle spasms., Disp: 60 tablet, Rfl: 1   polyethylene glycol powder (GLYCOLAX /MIRALAX ) 17 GM/SCOOP powder, Take 17 g (1 capful) and mix in  8 oz of clear liquid and drink by mouth daily., Disp: 238 g, Rfl: 0   predniSONE  (STERAPRED UNI-PAK 21 TAB) 10 MG (21) TBPK tablet, Take following package directions, Disp: 21 tablet, Rfl: 0   sildenafil  (VIAGRA ) 100 MG tablet, Take 0.5-1 tablets (50-100 mg total) by mouth daily as needed for erectile dysfunction., Disp: 10 tablet, Rfl: 11   spironolactone  (ALDACTONE ) 50 MG tablet, Take 1 tablet (50 mg total) by mouth daily., Disp: 90 tablet, Rfl: 1  Observations/Objective: Patient is well-developed, well-nourished in no acute distress.  Resting comfortably at home.  Head is normocephalic, atraumatic.  No labored breathing.  Speech is clear and coherent with logical content.  Patient is alert and oriented at baseline.    Assessment and Plan: 1. Dental infection (Primary)  - Since symptoms are worsening and not responding to oral treatment I have advised for him to seek in person evaluation again - Failing high dose  clindamycin  and pain not well controlled with oral narcotic pain medications - May consider dental block for pain control - Consider evaluation for foreign body under the gum line due to mechanism of injury and no improvement  Recommendation to be evaluated in person again at local Urgent Care or ER Facility  Follow Up Instructions: I discussed the assessment and treatment plan with the patient. The patient was provided an opportunity to ask questions and all were answered. The patient agreed with the plan and demonstrated an understanding of the instructions.  A copy of instructions were sent to the patient via MyChart unless otherwise noted below.    The patient was advised to call back or seek an in-person evaluation if the symptoms worsen or if the condition fails to improve as anticipated.    Angelia Kelp, PA-C

## 2023-07-18 NOTE — ED Triage Notes (Signed)
 Pt reports continuing dental pain, seen 2 days ago and given abx and pain meds. Pain not getting better. Thought he had a fever at home. Virtual doc told him to come to ed for eval. Took 1.5 pain pills this morning.

## 2023-07-18 NOTE — Patient Instructions (Signed)
 [name], thank you for joining Angelia Kelp, PA-C for today's virtual visit.  While this provider is not your primary care provider (PCP), if your PCP is located in our provider database this encounter information will be shared with them immediately following your visit.   A [facility] MyChart account gives you access to today's visit and all your visits, tests, and labs performed at [facility] " click here if you don't have a [facility] MyChart account or go to [email or web address]  Consent: (Patient) [name] provided verbal consent for this virtual visit at the beginning of the encounter.  Current Medications:  Current Outpatient Medications:    Accu-Chek Softclix Lancets lancets, Use as instructed to check blood sugars once a day, Disp: 100 each, Rfl: 12   acetaminophen  (TYLENOL ) 500 MG tablet, Take 500 mg by mouth every 6 (six) hours as needed for moderate pain (pain score 4-6)., Disp: , Rfl:    amLODipine  (NORVASC ) 10 MG tablet, Take 1 tablet (10 mg total) by mouth daily., Disp: 90 tablet, Rfl: 1   apixaban  (ELIQUIS ) 5 MG TABS tablet, Take 1 tablet (5 mg total) by mouth 2 (two) times daily., Disp: 60 tablet, Rfl: 11   Blood Glucose Monitoring Suppl (ACCU-CHEK GUIDE) w/Device KIT, Use to check blood sugars once a day, Disp: 1 kit, Rfl: 0   budesonide  (PULMICORT ) 0.5 MG/2ML nebulizer solution, Mix 4 mLs (1 mg total/2 respules) itno 240 ml of nasal saline irrigation once daily., Disp: 120 mL, Rfl: 11   carvedilol  (COREG ) 25 MG tablet, Take 1 tablet (25 mg total) by mouth 2 (two) times daily with a meal., Disp: 180 tablet, Rfl: 1   clindamycin  (CLEOCIN ) 150 MG capsule, Take 2 capsules (300 mg total) by mouth 4 (four) times daily., Disp: 56 capsule, Rfl: 0   clindamycin  (CLEOCIN ) 300 MG capsule, Take 1 capsule (300 mg total) by mouth 3 (three) times daily for 10 days., Disp: 30 capsule, Rfl: 0   gabapentin (NEURONTIN) 300 MG capsule, Take 300 mg by  mouth 3 (three) times daily., Disp: , Rfl:    glucose blood (ACCU-CHEK GUIDE TEST) test strip, Use as instructed to check blood sugars once a day, Disp: 100 each, Rfl: 12   HYDROcodone -acetaminophen  (NORCO/VICODIN) 5-325 MG tablet, Take 1-2 tablets by mouth every 6 (six) hours as needed for severe pain (pain score 7-10)., Disp: 12 tablet, Rfl: 0   losartan  (COZAAR ) 100 MG tablet, Take 1 tablet (100 mg total) by mouth daily., Disp: 90 tablet, Rfl: 1   metFORMIN  (GLUCOPHAGE ) 500 MG tablet, Take 1 tablet (500 mg total) by mouth daily for 14 days, THEN 1 tablet (500 mg total) 2 (two) times daily., Disp: 180 tablet, Rfl: 3   methimazole  (TAPAZOLE ) 5 MG tablet, Take 0.5 tablets (2.5 mg total) by mouth daily., Disp: , Rfl:    methocarbamol  (ROBAXIN ) 750 MG tablet, Take 1 tablet (750 mg total) by mouth 2 (two) times daily as needed for muscle spasms., Disp: 60 tablet, Rfl: 1   polyethylene glycol powder (GLYCOLAX /MIRALAX ) 17 GM/SCOOP powder, Take 17 g (1 capful) and mix in 8 oz of clear liquid and drink by mouth daily., Disp: 238 g, Rfl: 0   predniSONE  (STERAPRED UNI-PAK 21 TAB) 10 MG (21) TBPK tablet, Take following package directions, Disp: 21 tablet, Rfl: 0   sildenafil  (VIAGRA ) 100 MG tablet, Take 0.5-1 tablets (50-100 mg total) by mouth daily as needed for erectile dysfunction., Disp: 10 tablet, Rfl: 11   spironolactone  (ALDACTONE )  50 MG tablet, Take 1 tablet (50 mg total) by mouth daily., Disp: 90 tablet, Rfl: 1   Medications ordered in this encounter:  No orders of the defined types were placed in this encounter.    *If you need refills on other medications prior to your next appointment, please contact your pharmacy*  Follow-Up: Call back or seek an in-person evaluation if the symptoms worsen or if the condition fails to improve as anticipated.  [facility] [phone]  Other Instructions Seek in person evaluation   If you have been instructed to have an in-person  evaluation today at a local Urgent Care facility, please use the link below. It will take you to a list of all of our available [facility], including address, phone number and hours of operation. Please do not delay care.  [facility]  If you or a family member do not have a primary care provider, use the link below to schedule a visit and establish care. When you choose a [facility] primary care physician or advanced practice provider, you gain a long-term partner in health. Find a Primary Care Provider  Learn more about [facility] in-office and virtual care options: [place] - Get Care Now

## 2023-07-25 ENCOUNTER — Encounter: Attending: Internal Medicine | Admitting: Dietician

## 2023-07-25 ENCOUNTER — Encounter: Payer: Self-pay | Admitting: Dietician

## 2023-07-25 VITALS — Wt 207.0 lb

## 2023-07-25 DIAGNOSIS — E119 Type 2 diabetes mellitus without complications: Secondary | ICD-10-CM | POA: Insufficient documentation

## 2023-07-25 NOTE — Patient Instructions (Signed)
 Goal 1: limit soda to 1 mini can per day.  Goal 2: aim for 64 oz water daily (4 16 oz bottles).   Goal 3: switch to body armor lite.

## 2023-07-25 NOTE — Progress Notes (Signed)
 Diabetes Self-Management Education  Visit Type: First/Initial  Appt. Start Time: 1600 Appt. End Time: 1700  07/25/2023  Mr. Ward Comas, identified by name and date of birth, is a 48 y.o. male with a diagnosis of Diabetes: Type 2.   ASSESSMENT  History includes: type 2 diabetes, hypertension Labs noted: 06/07/23 A1c 7.2% Medications include: metformin   (pt reports not taking) Supplements: none reported  Pt reports he checked his blood glucose yesterday morning and it was 200 and reports the night before he had ice cream and a soda with dinner. Pt states prior to diagnosis he was drinking 4 cans of soda daily, and is now drinking 1-2 mini cans. Pt reports he was also drinking gatorade and switched to body armor.   Pt states he works at [employer] and walks a lot at work and does some heavy lifting. Pt reports outside of work he does not exercise. Pt works Tues/Wed 3pm-8pm, and weekend mornings.   Pt reports most days he eats lunch and dinner, but may skip breakfast. Pt states he often gets fast food for lunch.  Weight 207 lb (93.9 kg). Body mass index is 26.58 kg/m.   Diabetes Self-Management Education - 07/25/23 1557       Visit Information   Visit Type First/Initial      Initial Visit   Diabetes Type Type 2    Date Diagnosed 06/07/23    Are you currently following a meal plan? No    Are you taking your medications as prescribed? Yes      Health Coping   How would you rate your overall health? Fair      Psychosocial Assessment   Patient Belief/Attitude about Diabetes Motivated to manage diabetes    What is the hardest part about your diabetes right now, causing you the most concern, or is the most worrisome to you about your diabetes?   Making healty food and beverage choices    Self-care barriers None    Self-management support Doctor's office    Other persons present Patient    Patient Concerns Nutrition/Meal planning    Special Needs None    Preferred  Learning Style No preference indicated    Learning Readiness Ready    How often do you need to have someone help you when you read instructions, pamphlets, or other written materials from your doctor or pharmacy? 1 - Never    What is the last grade level you completed in school? graduate      Pre-Education Assessment   Patient understands the diabetes disease and treatment process. Needs Instruction    Patient understands incorporating nutritional management into lifestyle. Needs Instruction    Patient undertands incorporating physical activity into lifestyle. Needs Instruction    Patient understands using medications safely. Needs Instruction    Patient understands monitoring blood glucose, interpreting and using results Needs Instruction    Patient understands prevention, detection, and treatment of acute complications. Needs Instruction    Patient understands prevention, detection, and treatment of chronic complications. Needs Instruction    Patient understands how to develop strategies to address psychosocial issues. Needs Instruction    Patient understands how to develop strategies to promote health/change behavior. Needs Instruction      Complications   Last HgB A1C per patient/outside source 7.2 %    How often do you check your blood sugar? 1-2 times/day    Fasting Blood glucose range (mg/dL) 161-096;04-540    Postprandial Blood glucose range (mg/dL) 981-191    Have  you had a dilated eye exam in the past 12 months? No    Have you had a dental exam in the past 12 months? No    Are you checking your feet? Yes    How many days per week are you checking your feet? 7      Dietary Intake   Breakfast none OR scrambled eggs and pancakes with syrup and [place] links    Snack (morning) none OR pineapple    Lunch 3 scrambled eggs and milk OR fast food burger    Snack (afternoon) none    Dinner broccoli and cheese and rice    Snack (evening) none OR ice cream OR mac and cheese     Beverage(s) 1-2 mini cans soda, 40 oz water, 3 body armor      Activity / Exercise   Activity / Exercise Type Light (walking / raking leaves)      Patient Education   Previous Diabetes Education No    Disease Pathophysiology Definition of diabetes, type 1 and 2, and the diagnosis of diabetes;Factors that contribute to the development of diabetes;Explored patient's options for treatment of their diabetes    Healthy Eating Role of diet in the treatment of diabetes and the relationship between the three main macronutrients and blood glucose level;Plate Method;Meal timing in regards to the patients' current diabetes medication.;Meal options for control of blood glucose level and chronic complications.;Reviewed blood glucose goals for pre and post meals and how to evaluate the patients' food intake on their blood glucose level.    Being Active Role of exercise on diabetes management, blood pressure control and cardiac health.;Helped patient identify appropriate exercises in relation to his/her diabetes, diabetes complications and other health issue.    Medications Reviewed patients medication for diabetes, action, purpose, timing of dose and side effects.    Monitoring Identified appropriate SMBG and/or A1C goals.    Acute complications Taught prevention, symptoms, and  treatment of hypoglycemia - the 15 rule.;Discussed and identified patients' prevention, symptoms, and treatment of hyperglycemia.    Chronic complications Relationship between chronic complications and blood glucose control;Identified and discussed with patient  current chronic complications    Diabetes Stress and Support Worked with patient to identify barriers to care and solutions;Identified and addressed patients feelings and concerns about diabetes;Role of stress on diabetes    Lifestyle and Health Coping Lifestyle issues that need to be addressed for better diabetes care      Individualized Goals (developed by patient)   Nutrition  General guidelines for healthy choices and portions discussed    Physical Activity Exercise 1-2 times per week;30 minutes per day    Medications take my medication as prescribed    Monitoring  Test my blood glucose as discussed    Problem Solving Eating Pattern    Reducing Risk examine blood glucose patterns;do foot checks daily;treat hypoglycemia with 15 grams of carbs if blood glucose less than 70mg /dL    Health Coping Ask for help with psychological, social, or emotional issues      Post-Education Assessment   Patient understands the diabetes disease and treatment process. Comprehends key points    Patient understands incorporating nutritional management into lifestyle. Comprehends key points    Patient undertands incorporating physical activity into lifestyle. Comprehends key points    Patient understands using medications safely. Comphrehends key points    Patient understands monitoring blood glucose, interpreting and using results Comprehends key points    Patient understands prevention, detection, and treatment of acute  complications. Comprehends key points    Patient understands prevention, detection, and treatment of chronic complications. Comprehends key points    Patient understands how to develop strategies to address psychosocial issues. Comprehends key points    Patient understands how to develop strategies to promote health/change behavior. Comprehends key points      Outcomes   Expected Outcomes Demonstrated interest in learning. Expect positive outcomes    Future DMSE 2 months    Program Status Not Completed             Individualized Plan for Diabetes Self-Management Training:   Learning Objective:  Patient will have a greater understanding of diabetes self-management. Patient education plan is to attend individual and/or group sessions per assessed needs and concerns.   Plan:   Patient Instructions  Goal 1: limit soda to 1 mini can per day.  Goal 2: aim for  64 oz water daily (4 16 oz bottles).   Goal 3: switch to body armor lite.  Expected Outcomes:  Demonstrated interest in learning. Expect positive outcomes  Education material provided: ADA - How to Thrive: A Guide for Your Journey with Diabetes and My Plate  If problems or questions, patient to contact team via:  Phone  Future DSME appointment: 2 months

## 2023-07-26 ENCOUNTER — Other Ambulatory Visit: Payer: Self-pay

## 2023-07-29 ENCOUNTER — Other Ambulatory Visit: Payer: Self-pay

## 2023-08-08 ENCOUNTER — Other Ambulatory Visit: Payer: Self-pay

## 2023-08-14 ENCOUNTER — Telehealth: Admitting: Family Medicine

## 2023-08-14 DIAGNOSIS — M7989 Other specified soft tissue disorders: Secondary | ICD-10-CM

## 2023-08-14 NOTE — Progress Notes (Signed)
 Virtual Visit Consent   [name], you are scheduled for a virtual visit with a [facility] provider today. Just as with appointments in the office, your consent must be obtained to participate. Your consent will be active for this visit and any virtual visit you may have with one of our providers in the next 365 days. If you have a MyChart account, a copy of this consent can be sent to you electronically.  As this is a virtual visit, video technology does not allow for your provider to perform a traditional examination. This may limit your provider's ability to fully assess your condition. If your provider identifies any concerns that need to be evaluated in person or the need to arrange testing (such as labs, EKG, etc.), we will make arrangements to do so. Although advances in technology are sophisticated, we cannot ensure that it will always work on either your end or our end. If the connection with a video visit is poor, the visit may have to be switched to a telephone visit. With either a video or telephone visit, we are not always able to ensure that we have a secure connection.  By engaging in this virtual visit, you consent to the provision of healthcare and authorize for your insurance to be billed (if applicable) for the services provided during this visit. Depending on your insurance coverage, you may receive a charge related to this service.  I need to obtain your verbal consent now. Are you willing to proceed with your visit today? [name] has provided verbal consent on 08/14/2023 for a virtual visit (video or telephone). Lanetta Pion, NP  Date: 08/14/2023 3:11 PM   Virtual Visit via Video Note   I, Lanetta Pion, connected with  [name]  ([id number], 10-02-1975) on 08/14/23 at  3:15 PM EDT by a video-enabled telemedicine application and verified that I am speaking with the correct person using two identifiers.  Location: Patient: Virtual Visit Location Patient:  Home Provider: Virtual Visit Location Provider: Home Office   I discussed the limitations of evaluation and management by telemedicine and the availability of in person appointments. The patient expressed understanding and agreed to proceed.    History of Present Illness: [name] is a 48 y.o. who identifies as a male who was assigned male at birth, and is being seen today for leg swelling and pain. Got up to go to the dentist. And noted the right leg was hurting some and he took some meds and elevated it once home. He reports it improved some, but still having issues with pain. Reports he has missed 2 evening doses of Eliquis  this week.   Recent Noted to have a blood clot in that leg in March and PE that seem to be provoked by malignancy.    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   Pulmonary embolism on right (HCC) 09/24/2022   Nasal cavity mass 09/24/2022   Acute deep vein thrombosis (DVT) of femoral vein of right lower extremity (HCC) 08/21/2022   Neck pain 08/14/2021   Prediabetes 01/28/2021   Vitamin D  deficiency 01/28/2021   Essential hypertension 09/08/2020   Pre-ulcerative corn or callous 02/01/2019   Disorder of  left Achilles tendon 02/01/2019   Inguinal hernia of left side without obstruction or gangrene 12/08/2016   Former tobacco use 12/08/2016   Localized swelling, mass and lump, multiple sites 12/08/2016   Subcutaneous nodules 07/09/2016   Periodontitis 05/27/2015   Pain in left wrist 05/27/2015   HTN (hypertension) 05/12/2015    Allergies:  Allergies  Allergen Reactions   Doxycycline  Other (See Comments)    Possible allergen. Developed Bell's Palsy the day after taking antibiotic.   Penicillins  Swelling and Rash   Medications:  Current Outpatient Medications:    Accu-Chek Softclix Lancets lancets, Use as instructed to check blood sugars once a day, Disp: 100 each, Rfl: 12   acetaminophen  (TYLENOL ) 500 MG tablet, Take 500 mg by mouth every 6 (six) hours as needed for moderate pain (pain score 4-6)., Disp: , Rfl:    amLODipine  (NORVASC ) 10 MG tablet, Take 1 tablet (10 mg total) by mouth daily., Disp: 90 tablet, Rfl: 1   apixaban  (ELIQUIS ) 5 MG TABS tablet, Take 1 tablet (5 mg total) by mouth 2 (two) times daily., Disp: 60 tablet, Rfl: 11   Blood Glucose Monitoring Suppl (ACCU-CHEK GUIDE) w/Device KIT, Use to check blood sugars once a day, Disp: 1 kit, Rfl: 0   budesonide  (PULMICORT ) 0.5 MG/2ML nebulizer solution, Mix 4 mLs (1 mg total/2 respules) itno 240 ml of nasal saline irrigation once daily., Disp: 120 mL, Rfl: 11   carvedilol  (COREG ) 25 MG tablet, Take 1 tablet (25 mg total) by mouth 2 (two) times daily with a meal., Disp: 180 tablet, Rfl: 1   clindamycin  (CLEOCIN ) 150 MG capsule, Take 2 capsules (300 mg total) by mouth 4 (four) times daily., Disp: 56 capsule, Rfl: 0   gabapentin (NEURONTIN) 300 MG capsule, Take 300 mg by mouth 3 (three) times daily., Disp: , Rfl:    glucose blood (ACCU-CHEK GUIDE TEST) test strip, Use as instructed to check blood sugars once a day, Disp: 100 each, Rfl: 12   HYDROcodone -acetaminophen  (NORCO/VICODIN) 5-325 MG tablet, Take 1-2 tablets by mouth every 6 (six) hours as needed for severe pain (pain score 7-10)., Disp: 12 tablet, Rfl: 0   losartan  (COZAAR ) 100 MG tablet, Take 1 tablet (100 mg total) by mouth daily., Disp: 90 tablet, Rfl: 1   metFORMIN  (GLUCOPHAGE ) 500 MG tablet, Take 1 tablet (500 mg total) by mouth daily for 14 days, THEN 1 tablet (500 mg total) 2 (two) times daily., Disp: 180 tablet, Rfl: 3   methimazole  (TAPAZOLE ) 5 MG tablet, Take 0.5 tablets (2.5 mg total) by mouth daily., Disp: , Rfl:    methocarbamol  (ROBAXIN ) 750 MG tablet, Take 1  tablet (750 mg total) by mouth 2 (two) times daily as needed for muscle spasms., Disp: 60 tablet, Rfl: 1   polyethylene glycol powder (GLYCOLAX /MIRALAX ) 17 GM/SCOOP powder, Take 17 g (1 capful) and mix in 8 oz of clear liquid and drink by mouth daily., Disp: 238 g, Rfl: 0   predniSONE  (STERAPRED UNI-PAK 21 TAB) 10 MG (21) TBPK tablet, Take following package directions, Disp: 21 tablet, Rfl: 0   sildenafil  (VIAGRA ) 100 MG tablet, Take 0.5-1 tablets (50-100 mg total) by mouth daily as needed for erectile dysfunction., Disp: 10 tablet, Rfl: 11   spironolactone  (ALDACTONE ) 50 MG tablet, Take 1 tablet (50 mg total) by mouth daily., Disp: 90 tablet, Rfl: 1  Observations/Objective: Patient is well-developed, well-nourished in no acute distress.  Resting comfortably  at home.  Head is normocephalic, atraumatic.  No labored breathing.  Speech is  clear and coherent with logical content.  Patient is alert and oriented at baseline.    Assessment and Plan:  1. Leg swelling (Primary)  Advised he be seen in person given high risk of clots and missed medication    Patient acknowledged agreement and understanding of the plan.   Past Medical, Surgical, Social History, Allergies, and Medications have been Reviewed.    Follow Up Instructions: I discussed the assessment and treatment plan with the patient. The patient was provided an opportunity to ask questions and all were answered. The patient agreed with the plan and demonstrated an understanding of the instructions.  A copy of instructions were sent to the patient via MyChart unless otherwise noted below.    The patient was advised to call back or seek an in-person evaluation if the symptoms worsen or if the condition fails to improve as anticipated.    Lanetta Pion, NP

## 2023-08-21 ENCOUNTER — Other Ambulatory Visit (HOSPITAL_COMMUNITY): Payer: Self-pay

## 2023-08-21 ENCOUNTER — Other Ambulatory Visit: Payer: Self-pay

## 2023-09-05 ENCOUNTER — Ambulatory Visit: Admitting: Dietician

## 2023-09-10 ENCOUNTER — Other Ambulatory Visit: Payer: Self-pay | Admitting: Nurse Practitioner

## 2023-09-10 ENCOUNTER — Other Ambulatory Visit: Payer: Self-pay

## 2023-09-10 DIAGNOSIS — M79605 Pain in left leg: Secondary | ICD-10-CM | POA: Diagnosis not present

## 2023-09-10 DIAGNOSIS — N529 Male erectile dysfunction, unspecified: Secondary | ICD-10-CM

## 2023-09-10 DIAGNOSIS — J3489 Other specified disorders of nose and nasal sinuses: Secondary | ICD-10-CM | POA: Diagnosis not present

## 2023-09-10 DIAGNOSIS — C112 Malignant neoplasm of lateral wall of nasopharynx: Secondary | ICD-10-CM | POA: Diagnosis not present

## 2023-09-14 ENCOUNTER — Other Ambulatory Visit: Payer: Self-pay | Admitting: Nurse Practitioner

## 2023-09-14 DIAGNOSIS — N529 Male erectile dysfunction, unspecified: Secondary | ICD-10-CM

## 2023-09-16 ENCOUNTER — Other Ambulatory Visit: Payer: Self-pay

## 2023-09-18 DIAGNOSIS — M1612 Unilateral primary osteoarthritis, left hip: Secondary | ICD-10-CM | POA: Diagnosis not present

## 2023-09-19 ENCOUNTER — Encounter: Payer: Self-pay | Admitting: Dietician

## 2023-09-19 ENCOUNTER — Encounter: Attending: Internal Medicine | Admitting: Dietician

## 2023-09-19 VITALS — Wt 208.0 lb

## 2023-09-19 DIAGNOSIS — E119 Type 2 diabetes mellitus without complications: Secondary | ICD-10-CM | POA: Insufficient documentation

## 2023-09-19 NOTE — Patient Instructions (Signed)
 New Goal:   Goal 1: go to the gym 2 days per week.   Previous Goals Established by Patient:  Goal 1: limit soda to 1 mini can per day.    Goal 2: aim for 64 oz water daily (4 16 oz bottles).    Goal 3: switch to body armor lite.

## 2023-09-19 NOTE — Progress Notes (Signed)
 Diabetes Self-Management Education  Visit Type: Follow-up  Appt. Start Time: 0910 Appt. End Time: 0940  09/19/2023  Mr. [name], identified by name and date of birth, is a 48 y.o. male with a diagnosis of Diabetes: Type 2.   ASSESSMENT  History includes: type 2 diabetes, hypertension Labs noted: 06/07/23 A1c 7.2% Medications include: metformin   (pt not taking) Supplements: none reported  Pt states he had to go to the ortho due to pain in his leg and is planning on doing physical therapy. Pt states he went to the gym for physical therapy and it motivated him to want to start doing some strength training and plans to start.   Pt states he has been working on water intake and reports he has still been struggling with soda intake. Pt had goal to have 1 mini can daily, and states some days he has but reports the other day he had 1 mini can with breakfast, a 12 oz can while out, and a mini can with dinner.   Pt states he has been eating smaller portions and working on increasing vegetable intake.   Pt reports he has been checking his fasting blood glucose and it is usually around 102mg /dL. Pt states sometimes he checks before bed.  New Goal:   Goal 1: go to the gym 2 days per week.   Assessment of Previous Goals Established by Patient:   Goal 1: limit soda to 1 mini can per day. - goal not met, continue!   Goal 2: aim for 64 oz water daily (4 16 oz bottles). - goal in progress, meets some days. Continue!   Goal 3: switch to body armor lite. - goal not met, is buying less of the regular.   Weight 208 lb (94.3 kg). Body mass index is 26.71 kg/m.   Diabetes Self-Management Education - 09/19/23 0909       Visit Information   Visit Type Follow-up      Initial Visit   Diabetes Type Type 2    Are you currently following a meal plan? No      Health Coping   How would you rate your overall health? Good      Psychosocial Assessment   Patient Belief/Attitude about Diabetes  Motivated to manage diabetes    What is the hardest part about your diabetes right now, causing you the most concern, or is the most worrisome to you about your diabetes?   Making healty food and beverage choices    Self-care barriers None    Self-management support Doctor's office    Other persons present Patient    Patient Concerns Nutrition/Meal planning    Special Needs None    Preferred Learning Style No preference indicated    Learning Readiness Ready      Pre-Education Assessment   Patient understands the diabetes disease and treatment process. Needs Review    Patient understands incorporating nutritional management into lifestyle. Needs Review    Patient undertands incorporating physical activity into lifestyle. Needs Review    Patient understands using medications safely. Needs Review    Patient understands monitoring blood glucose, interpreting and using results Needs Review    Patient understands prevention, detection, and treatment of acute complications. Needs Review    Patient understands prevention, detection, and treatment of chronic complications. Needs Review    Patient understands how to develop strategies to address psychosocial issues. Needs Review    Patient understands how to develop strategies to promote health/change behavior. Needs Review  Complications   Last HgB A1C per patient/outside source 7.2 %    How often do you check your blood sugar? 3-4 times / week    Fasting Blood glucose range (mg/dL) 29-870      Dietary Intake   Breakfast [place] sandwich OR scrambled eggs and grits    Snack (morning) pineapple    Lunch [place] sandwich    Snack (afternoon) none OR chips    Dinner pasta alfredo with broccoli    Snack (evening) none OR ice cream    Beverage(s) 32-64 oz water, body armor 2x/wk,      Activity / Exercise   Activity / Exercise Type ADL's      Patient Education   Previous Diabetes Education Yes    Disease Pathophysiology Explored  patient's options for treatment of their diabetes    Healthy Eating Role of diet in the treatment of diabetes and the relationship between the three main macronutrients and blood glucose level;Plate Method;Meal timing in regards to the patients' current diabetes medication.;Information on hints to eating out and maintain blood glucose control.;Reviewed blood glucose goals for pre and post meals and how to evaluate the patients' food intake on their blood glucose level.    Being Active Role of exercise on diabetes management, blood pressure control and cardiac health.;Helped patient identify appropriate exercises in relation to his/her diabetes, diabetes complications and other health issue.    Medications Reviewed patients medication for diabetes, action, purpose, timing of dose and side effects.    Monitoring Identified appropriate SMBG and/or A1C goals.    Chronic complications Relationship between chronic complications and blood glucose control;Identified and discussed with patient  current chronic complications    Diabetes Stress and Support Identified and addressed patients feelings and concerns about diabetes;Worked with patient to identify barriers to care and solutions    Lifestyle and Health Coping Lifestyle issues that need to be addressed for better diabetes care      Individualized Goals (developed by patient)   Nutrition General guidelines for healthy choices and portions discussed    Physical Activity Exercise 1-2 times per week;30 minutes per day    Medications take my medication as prescribed    Monitoring  Test my blood glucose as discussed    Problem Solving Sleep Pattern    Reducing Risk examine blood glucose patterns;do foot checks daily;treat hypoglycemia with 15 grams of carbs if blood glucose less than 70mg /dL    Health Coping Ask for help with psychological, social, or emotional issues      Patient Self-Evaluation of Goals - Patient rates self as meeting previously set goals  (% of time)   Nutrition 50 - 75 % (half of the time)    Physical Activity < 25% (hardly ever/never)    Medications < 25% (hardly ever/never)    Monitoring 50 - 75 % (half of the time)    Problem Solving and behavior change strategies  50 - 75 % (half of the time)    Reducing Risk (treating acute and chronic complications) 50 - 75 % (half of the time)    Health Coping 50 - 75 % (half of the time)      Post-Education Assessment   Patient understands the diabetes disease and treatment process. Comprehends key points    Patient understands incorporating nutritional management into lifestyle. Comprehends key points    Patient undertands incorporating physical activity into lifestyle. Comprehends key points    Patient understands using medications safely. Comphrehends key points  Patient understands monitoring blood glucose, interpreting and using results Comprehends key points    Patient understands prevention, detection, and treatment of acute complications. Comprehends key points    Patient understands prevention, detection, and treatment of chronic complications. Comprehends key points    Patient understands how to develop strategies to address psychosocial issues. Comprehends key points    Patient understands how to develop strategies to promote health/change behavior. Comprehends key points      Outcomes   Expected Outcomes Demonstrated interest in learning. Expect positive outcomes    Future DMSE 2 months    Program Status Not Completed      Subsequent Visit   Since your last visit have you continued or begun to take your medications as prescribed? No    Since your last visit, are you checking your blood glucose at least once a day? No          Individualized Plan for Diabetes Self-Management Training:   Learning Objective:  Patient will have a greater understanding of diabetes self-management. Patient education plan is to attend individual and/or group sessions per assessed  needs and concerns.   Plan:   Patient Instructions  New Goal:   Goal 1: go to the gym 2 days per week.   Previous Goals Established by Patient:  Goal 1: limit soda to 1 mini can per day.    Goal 2: aim for 64 oz water daily (4 16 oz bottles).    Goal 3: switch to body armor lite.   Expected Outcomes:  Demonstrated interest in learning. Expect positive outcomes  Education material provided: no handouts on this follow up  If problems or questions, patient to contact team via:  Phone  Future DSME appointment: 2 months

## 2023-09-20 ENCOUNTER — Other Ambulatory Visit: Payer: Self-pay

## 2023-09-20 ENCOUNTER — Telehealth: Admitting: Emergency Medicine

## 2023-09-20 DIAGNOSIS — M25552 Pain in left hip: Secondary | ICD-10-CM

## 2023-09-20 NOTE — Patient Instructions (Addendum)
 [name] Single, thank you for joining Jon CHRISTELLA Belt, NP for today's virtual visit.  While this provider is not your primary care provider (PCP), if your PCP is located in our provider database this encounter information will be shared with them immediately following your visit.   A [facility] MyChart account gives you access to today's visit and all your visits, tests, and labs performed at [facility]  click here if you don't have a [facility] MyChart account or go to [email or web address]  Consent: (Patient) [name] Single provided verbal consent for this virtual visit at the beginning of the encounter.  Current Medications:  Current Outpatient Medications:    Accu-Chek Softclix Lancets lancets, Use as instructed to check blood sugars once a day, Disp: 100 each, Rfl: 12   acetaminophen  (TYLENOL ) 500 MG tablet, Take 500 mg by mouth every 6 (six) hours as needed for moderate pain (pain score 4-6)., Disp: , Rfl:    amLODipine  (NORVASC ) 10 MG tablet, Take 1 tablet (10 mg total) by mouth daily., Disp: 90 tablet, Rfl: 1   apixaban  (ELIQUIS ) 5 MG TABS tablet, Take 1 tablet (5 mg total) by mouth 2 (two) times daily., Disp: 60 tablet, Rfl: 11   Blood Glucose Monitoring Suppl (ACCU-CHEK GUIDE) w/Device KIT, Use to check blood sugars once a day, Disp: 1 kit, Rfl: 0   budesonide  (PULMICORT ) 0.5 MG/2ML nebulizer solution, Mix 4 mLs (1 mg total/2 respules) itno 240 ml of nasal saline irrigation once daily., Disp: 120 mL, Rfl: 11   carvedilol  (COREG ) 25 MG tablet, Take 1 tablet (25 mg total) by mouth 2 (two) times daily with a meal., Disp: 180 tablet, Rfl: 1   clindamycin  (CLEOCIN ) 150 MG capsule, Take 2 capsules (300 mg total) by mouth 4 (four) times daily., Disp: 56 capsule, Rfl: 0   gabapentin (NEURONTIN) 300 MG capsule, Take 300 mg by mouth 3 (three) times daily., Disp: , Rfl:    glucose blood (ACCU-CHEK GUIDE TEST) test strip, Use as instructed to check blood sugars once a day,  Disp: 100 each, Rfl: 12   HYDROcodone -acetaminophen  (NORCO/VICODIN) 5-325 MG tablet, Take 1-2 tablets by mouth every 6 (six) hours as needed for severe pain (pain score 7-10)., Disp: 12 tablet, Rfl: 0   losartan  (COZAAR ) 100 MG tablet, Take 1 tablet (100 mg total) by mouth daily., Disp: 90 tablet, Rfl: 1   metFORMIN  (GLUCOPHAGE ) 500 MG tablet, Take 1 tablet (500 mg total) by mouth daily for 14 days, THEN 1 tablet (500 mg total) 2 (two) times daily., Disp: 180 tablet, Rfl: 3   methimazole  (TAPAZOLE ) 5 MG tablet, Take 0.5 tablets (2.5 mg total) by mouth daily., Disp: , Rfl:    methocarbamol  (ROBAXIN ) 750 MG tablet, Take 1 tablet (750 mg total) by mouth 2 (two) times daily as needed for muscle spasms., Disp: 60 tablet, Rfl: 1   polyethylene glycol powder (GLYCOLAX /MIRALAX ) 17 GM/SCOOP powder, Take 17 g (1 capful) and mix in 8 oz of clear liquid and drink by mouth daily., Disp: 238 g, Rfl: 0   predniSONE  (STERAPRED UNI-PAK 21 TAB) 10 MG (21) TBPK tablet, Take following package directions, Disp: 21 tablet, Rfl: 0   sildenafil  (VIAGRA ) 100 MG tablet, Take 0.5-1 tablets (50-100 mg total) by mouth daily as needed for erectile dysfunction., Disp: 10 tablet, Rfl: 11   spironolactone  (ALDACTONE ) 50 MG tablet, Take 1 tablet (50 mg total) by mouth daily., Disp: 90 tablet, Rfl: 1   Medications ordered in this encounter:  No orders  of the defined types were placed in this encounter.    *If you need refills on other medications prior to your next appointment, please contact your pharmacy*  Follow-Up: Call back or seek an in-person evaluation if the symptoms worsen or if the condition fails to improve as anticipated.  [facility] [phone]  Other Instructions  Continue to follow up with ortho. I agree that you will likely benefit from a joint injection.   Continue Tylenol  and muscle relaxer. Heat therapy and cold therapy instructions attached.   Talk with a pharmacist about using  voltaren  with your other medications.    If you have been instructed to have an in-person evaluation today at a local Urgent Care facility, please use the link below. It will take you to a list of all of our available [facility], including address, phone number and hours of operation. Please do not delay care.  [facility]  If you or a family member do not have a primary care provider, use the link below to schedule a visit and establish care. When you choose a [facility] primary care physician or advanced practice provider, you gain a long-term partner in health. Find a Primary Care Provider  Learn more about [facility] in-office and virtual care options: [facility] Get Care Now

## 2023-09-20 NOTE — Progress Notes (Signed)
 Virtual Visit Consent   [name], you are scheduled for a virtual visit with a [facility] provider today. Just as with appointments in the office, your consent must be obtained to participate. Your consent will be active for this visit and any virtual visit you may have with one of our providers in the next 365 days. If you have a MyChart account, a copy of this consent can be sent to you electronically.  As this is a virtual visit, video technology does not allow for your provider to perform a traditional examination. This may limit your provider's ability to fully assess your condition. If your provider identifies any concerns that need to be evaluated in person or the need to arrange testing (such as labs, EKG, etc.), we will make arrangements to do so. Although advances in technology are sophisticated, we cannot ensure that it will always work on either your end or our end. If the connection with a video visit is poor, the visit may have to be switched to a telephone visit. With either a video or telephone visit, we are not always able to ensure that we have a secure connection.  By engaging in this virtual visit, you consent to the provision of healthcare and authorize for your insurance to be billed (if applicable) for the services provided during this visit. Depending on your insurance coverage, you may receive a charge related to this service.  I need to obtain your verbal consent now. Are you willing to proceed with your visit today? [name] has provided verbal consent on 09/20/2023 for a virtual visit (video or telephone). Jon CHRISTELLA Belt, NP  Date: 09/20/2023 10:19 AM   Virtual Visit via Video Note   I, Jon CHRISTELLA Belt, connected with  [name]  ([id number], Oct 26, 1975) on 09/20/23 at  9:15 AM EDT by a video-enabled telemedicine application and verified that I am speaking with the correct person using two identifiers.  Location: Patient: Virtual Visit Location Patient:  Home Provider: Virtual Visit Location Provider: Home Office   I discussed the limitations of evaluation and management by telemedicine and the availability of in person appointments. The patient expressed understanding and agreed to proceed.    History of Present Illness: [name] is a 48 y.o. who identifies as a male who was assigned male at birth, and is being seen today for LLE, groin, hip pain. Saw ortho earlier this week, has osteoarthritis - plan is for PT and possible injection into hip - pt is hesitant to get joint injection. Taking only Tylenol  and not NSAIDs due to current anticoagulant use for hx DVTs. Took tylenol  and one muscle relaxer (prescribed by pcp) today and it's not helping.  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   Pulmonary embolism on right (HCC) 09/24/2022   Nasal cavity mass 09/24/2022   Acute deep vein thrombosis (DVT) of femoral vein of right lower extremity (HCC) 08/21/2022   Neck pain 08/14/2021   Prediabetes 01/28/2021   Vitamin D  deficiency 01/28/2021   Essential hypertension 09/08/2020   Pre-ulcerative corn or callous 02/01/2019  Disorder of left Achilles tendon 02/01/2019   Inguinal hernia of left side without obstruction or gangrene 12/08/2016   Former tobacco use 12/08/2016   Localized swelling, mass and lump, multiple sites 12/08/2016   Subcutaneous nodules 07/09/2016   Periodontitis 05/27/2015   Pain in left wrist 05/27/2015   HTN (hypertension) 05/12/2015    Allergies:  Allergies  Allergen Reactions   Doxycycline  Other (See Comments)    Possible allergen. Developed Bell's Palsy  the day after taking antibiotic.   Penicillins Swelling and Rash   Medications:  Current Outpatient Medications:    Accu-Chek Softclix Lancets lancets, Use as instructed to check blood sugars once a day, Disp: 100 each, Rfl: 12   acetaminophen  (TYLENOL ) 500 MG tablet, Take 500 mg by mouth every 6 (six) hours as needed for moderate pain (pain score 4-6)., Disp: , Rfl:    amLODipine  (NORVASC ) 10 MG tablet, Take 1 tablet (10 mg total) by mouth daily., Disp: 90 tablet, Rfl: 1   apixaban  (ELIQUIS ) 5 MG TABS tablet, Take 1 tablet (5 mg total) by mouth 2 (two) times daily., Disp: 60 tablet, Rfl: 11   Blood Glucose Monitoring Suppl (ACCU-CHEK GUIDE) w/Device KIT, Use to check blood sugars once a day, Disp: 1 kit, Rfl: 0   budesonide  (PULMICORT ) 0.5 MG/2ML nebulizer solution, Mix 4 mLs (1 mg total/2 respules) itno 240 ml of nasal saline irrigation once daily., Disp: 120 mL, Rfl: 11   carvedilol  (COREG ) 25 MG tablet, Take 1 tablet (25 mg total) by mouth 2 (two) times daily with a meal., Disp: 180 tablet, Rfl: 1   clindamycin  (CLEOCIN ) 150 MG capsule, Take 2 capsules (300 mg total) by mouth 4 (four) times daily., Disp: 56 capsule, Rfl: 0   gabapentin (NEURONTIN) 300 MG capsule, Take 300 mg by mouth 3 (three) times daily., Disp: , Rfl:    glucose blood (ACCU-CHEK GUIDE TEST) test strip, Use as instructed to check blood sugars once a day, Disp: 100 each, Rfl: 12   HYDROcodone -acetaminophen  (NORCO/VICODIN) 5-325 MG tablet, Take 1-2 tablets by mouth every 6 (six) hours as needed for severe pain (pain score 7-10)., Disp: 12 tablet, Rfl: 0   losartan  (COZAAR ) 100 MG tablet, Take 1 tablet (100 mg total) by mouth daily., Disp: 90 tablet, Rfl: 1   metFORMIN  (GLUCOPHAGE ) 500 MG tablet, Take 1 tablet (500 mg total) by mouth daily for 14 days, THEN 1 tablet (500 mg total) 2 (two) times daily., Disp: 180 tablet, Rfl: 3   methimazole  (TAPAZOLE ) 5 MG tablet, Take 0.5 tablets (2.5 mg total) by mouth daily., Disp: , Rfl:     methocarbamol  (ROBAXIN ) 750 MG tablet, Take 1 tablet (750 mg total) by mouth 2 (two) times daily as needed for muscle spasms., Disp: 60 tablet, Rfl: 1   polyethylene glycol powder (GLYCOLAX /MIRALAX ) 17 GM/SCOOP powder, Take 17 g (1 capful) and mix in 8 oz of clear liquid and drink by mouth daily., Disp: 238 g, Rfl: 0   predniSONE  (STERAPRED UNI-PAK 21 TAB) 10 MG (21) TBPK tablet, Take following package directions, Disp: 21 tablet, Rfl: 0   sildenafil  (VIAGRA ) 100 MG tablet, Take 0.5-1 tablets (50-100 mg total) by mouth daily as needed for erectile dysfunction., Disp: 10 tablet, Rfl: 11   spironolactone  (ALDACTONE ) 50 MG tablet, Take 1 tablet (50 mg total) by mouth daily., Disp: 90 tablet, Rfl: 1  Observations/Objective: Patient is well-developed, well-nourished in no acute distress.  Resting comfortably  at home.  Head is normocephalic, atraumatic.  No labored breathing.  Speech is clear and coherent with logical content.  Patient is alert and oriented at baseline.    Assessment and Plan: 1. Left hip pain (Primary)  Continue to f/u with ortho for care. INjection discussed - I advised him to talk about it with ortho, could be helpful.   Talk with pharmacist about using voltaren  gel - I don't see contraindications. Discussed heat and ice therapy  Follow Up Instructions: I discussed the assessment and treatment plan with the patient. The patient was provided an opportunity to ask questions and all were answered. The patient agreed with the plan and demonstrated an understanding of the instructions.  A copy of instructions were sent to the patient via MyChart unless otherwise noted below.    The patient was advised to call back or seek an in-person evaluation if the symptoms worsen or if the condition fails to improve as anticipated.    Jon CHRISTELLA Belt, NP

## 2023-09-23 ENCOUNTER — Other Ambulatory Visit: Payer: Self-pay

## 2023-09-24 ENCOUNTER — Telehealth: Admitting: Nurse Practitioner

## 2023-09-24 DIAGNOSIS — M25552 Pain in left hip: Secondary | ICD-10-CM | POA: Diagnosis not present

## 2023-09-24 NOTE — Progress Notes (Signed)
 Virtual Visit Consent   [name], you are scheduled for a virtual visit with a [facility] provider today. Just as with appointments in the office, your consent must be obtained to participate. Your consent will be active for this visit and any virtual visit you may have with one of our providers in the next 365 days. If you have a MyChart account, a copy of this consent can be sent to you electronically.  As this is a virtual visit, video technology does not allow for your provider to perform a traditional examination. This may limit your provider's ability to fully assess your condition. If your provider identifies any concerns that need to be evaluated in person or the need to arrange testing (such as labs, EKG, etc.), we will make arrangements to do so. Although advances in technology are sophisticated, we cannot ensure that it will always work on either your end or our end. If the connection with a video visit is poor, the visit may have to be switched to a telephone visit. With either a video or telephone visit, we are not always able to ensure that we have a secure connection.  By engaging in this virtual visit, you consent to the provision of healthcare and authorize for your insurance to be billed (if applicable) for the services provided during this visit. Depending on your insurance coverage, you may receive a charge related to this service.  I need to obtain your verbal consent now. Are you willing to proceed with your visit today? [name] has provided verbal consent on 09/24/2023 for a virtual visit (video or telephone). Lauraine Kitty, FNP  Date: 09/24/2023 8:13 AM   Virtual Visit via Video Note   I, Lauraine Kitty, connected with  [name]  ([id number], 1975/06/08) on 09/24/23 at  8:30 AM EDT by a video-enabled telemedicine application and verified that I am speaking with the correct person using two identifiers.  Location: Patient: Virtual Visit Location Patient:  Home Provider: Virtual Visit Location Provider: Home Office   I discussed the limitations of evaluation and management by telemedicine and the availability of in person appointments. The patient expressed understanding and agreed to proceed.    History of Present Illness: [name] is a 48 y.o. who identifies as a male who was assigned male at birth, and is being seen today for ongoing left hip pain. He was seen on 09/20/23. He has been seen by Ortho and had a VUC appointment 7/11 as well. Was recommended for injections by Ortho but has not initiated that treatment plan yet. He had 2 days off work for rest and was improving.  Returned to work yesterday and was OK until last night and the pain increased again.   He has been using tylenol  and Robaxin  for pain relief. 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   Pulmonary embolism on right (HCC) 09/24/2022   Nasal cavity mass 09/24/2022   Acute deep vein thrombosis (DVT) of femoral vein of right lower extremity (HCC) 08/21/2022   Neck pain 08/14/2021   Prediabetes 01/28/2021   Vitamin D  deficiency 01/28/2021   Essential hypertension 09/08/2020  Pre-ulcerative corn or callous 02/01/2019   Disorder of left Achilles tendon 02/01/2019   Inguinal hernia of left side without obstruction or gangrene 12/08/2016   Former tobacco use 12/08/2016   Localized swelling, mass and lump, multiple sites 12/08/2016   Subcutaneous nodules 07/09/2016   Periodontitis 05/27/2015   Pain in left wrist 05/27/2015   HTN (hypertension) 05/12/2015    Allergies:  Allergies  Allergen Reactions   Doxycycline  Other (See Comments)     Possible allergen. Developed Bell's Palsy the day after taking antibiotic.   Penicillins Swelling and Rash   Medications:  Current Outpatient Medications:    Accu-Chek Softclix Lancets lancets, Use as instructed to check blood sugars once a day, Disp: 100 each, Rfl: 12   acetaminophen  (TYLENOL ) 500 MG tablet, Take 500 mg by mouth every 6 (six) hours as needed for moderate pain (pain score 4-6)., Disp: , Rfl:    amLODipine  (NORVASC ) 10 MG tablet, Take 1 tablet (10 mg total) by mouth daily., Disp: 90 tablet, Rfl: 1   apixaban  (ELIQUIS ) 5 MG TABS tablet, Take 1 tablet (5 mg total) by mouth 2 (two) times daily., Disp: 60 tablet, Rfl: 11   Blood Glucose Monitoring Suppl (ACCU-CHEK GUIDE) w/Device KIT, Use to check blood sugars once a day, Disp: 1 kit, Rfl: 0   budesonide  (PULMICORT ) 0.5 MG/2ML nebulizer solution, Mix 4 mLs (1 mg total/2 respules) itno 240 ml of nasal saline irrigation once daily., Disp: 120 mL, Rfl: 11   carvedilol  (COREG ) 25 MG tablet, Take 1 tablet (25 mg total) by mouth 2 (two) times daily with a meal., Disp: 180 tablet, Rfl: 1   clindamycin  (CLEOCIN ) 150 MG capsule, Take 2 capsules (300 mg total) by mouth 4 (four) times daily., Disp: 56 capsule, Rfl: 0   gabapentin (NEURONTIN) 300 MG capsule, Take 300 mg by mouth 3 (three) times daily., Disp: , Rfl:    glucose blood (ACCU-CHEK GUIDE TEST) test strip, Use as instructed to check blood sugars once a day, Disp: 100 each, Rfl: 12   HYDROcodone -acetaminophen  (NORCO/VICODIN) 5-325 MG tablet, Take 1-2 tablets by mouth every 6 (six) hours as needed for severe pain (pain score 7-10)., Disp: 12 tablet, Rfl: 0   losartan  (COZAAR ) 100 MG tablet, Take 1 tablet (100 mg total) by mouth daily., Disp: 90 tablet, Rfl: 1   metFORMIN  (GLUCOPHAGE ) 500 MG tablet, Take 1 tablet (500 mg total) by mouth daily for 14 days, THEN 1 tablet (500 mg total) 2 (two) times daily., Disp: 180 tablet, Rfl: 3   methimazole  (TAPAZOLE ) 5 MG tablet, Take 0.5 tablets (2.5 mg  total) by mouth daily., Disp: , Rfl:    methocarbamol  (ROBAXIN ) 750 MG tablet, Take 1 tablet (750 mg total) by mouth 2 (two) times daily as needed for muscle spasms., Disp: 60 tablet, Rfl: 1   polyethylene glycol powder (GLYCOLAX /MIRALAX ) 17 GM/SCOOP powder, Take 17 g (1 capful) and mix in 8 oz of clear liquid and drink by mouth daily., Disp: 238 g, Rfl: 0   predniSONE  (STERAPRED UNI-PAK 21 TAB) 10 MG (21) TBPK tablet, Take following package directions, Disp: 21 tablet, Rfl: 0   sildenafil  (VIAGRA ) 100 MG tablet, Take 0.5-1 tablets (50-100 mg total) by mouth daily as needed for erectile dysfunction., Disp: 10 tablet, Rfl: 11   spironolactone  (ALDACTONE ) 50 MG tablet, Take 1 tablet (50 mg total) by mouth daily., Disp: 90 tablet, Rfl: 1  Observations/Objective: Patient is well-developed, well-nourished in no acute distress.  Resting comfortably  at home.  Head is  normocephalic, atraumatic.  No labored breathing.  Speech is clear and coherent with logical content.  Patient is alert and oriented at baseline.    Assessment and Plan:  1. Left hip pain  Discussed requesting an earlier appointment with Ortho for plan moving forward. Continue with plan to start PT   Educated on pain relief methods and provided a work note for today for rest.    Follow Up Instructions: I discussed the assessment and treatment plan with the patient. The patient was provided an opportunity to ask questions and all were answered. The patient agreed with the plan and demonstrated an understanding of the instructions.  A copy of instructions were sent to the patient via MyChart unless otherwise noted below.    The patient was advised to call back or seek an in-person evaluation if the symptoms worsen or if the condition fails to improve as anticipated.    Lauraine Kitty, FNP

## 2023-10-03 ENCOUNTER — Other Ambulatory Visit: Payer: Self-pay

## 2023-10-03 ENCOUNTER — Other Ambulatory Visit (HOSPITAL_COMMUNITY): Payer: Self-pay

## 2023-10-04 ENCOUNTER — Telehealth: Payer: Self-pay | Admitting: Internal Medicine

## 2023-10-04 NOTE — Telephone Encounter (Signed)
 Confimed appt for 7/28

## 2023-10-07 ENCOUNTER — Ambulatory Visit: Attending: Internal Medicine | Admitting: Internal Medicine

## 2023-10-07 ENCOUNTER — Other Ambulatory Visit (HOSPITAL_COMMUNITY): Payer: Self-pay

## 2023-10-07 ENCOUNTER — Encounter: Payer: Self-pay | Admitting: Internal Medicine

## 2023-10-07 ENCOUNTER — Other Ambulatory Visit: Payer: Self-pay

## 2023-10-07 VITALS — BP 119/84 | HR 72 | Ht 74.0 in | Wt 205.0 lb

## 2023-10-07 DIAGNOSIS — M1612 Unilateral primary osteoarthritis, left hip: Secondary | ICD-10-CM | POA: Diagnosis not present

## 2023-10-07 DIAGNOSIS — E1159 Type 2 diabetes mellitus with other circulatory complications: Secondary | ICD-10-CM

## 2023-10-07 DIAGNOSIS — Z7984 Long term (current) use of oral hypoglycemic drugs: Secondary | ICD-10-CM

## 2023-10-07 DIAGNOSIS — Z8759 Personal history of other complications of pregnancy, childbirth and the puerperium: Secondary | ICD-10-CM

## 2023-10-07 DIAGNOSIS — Z86711 Personal history of pulmonary embolism: Secondary | ICD-10-CM | POA: Diagnosis not present

## 2023-10-07 DIAGNOSIS — K029 Dental caries, unspecified: Secondary | ICD-10-CM | POA: Diagnosis not present

## 2023-10-07 DIAGNOSIS — Z87448 Personal history of other diseases of urinary system: Secondary | ICD-10-CM

## 2023-10-07 DIAGNOSIS — Z85819 Personal history of malignant neoplasm of unspecified site of lip, oral cavity, and pharynx: Secondary | ICD-10-CM

## 2023-10-07 DIAGNOSIS — E1169 Type 2 diabetes mellitus with other specified complication: Secondary | ICD-10-CM

## 2023-10-07 DIAGNOSIS — Z85818 Personal history of malignant neoplasm of other sites of lip, oral cavity, and pharynx: Secondary | ICD-10-CM | POA: Diagnosis not present

## 2023-10-07 DIAGNOSIS — I2699 Other pulmonary embolism without acute cor pulmonale: Secondary | ICD-10-CM

## 2023-10-07 DIAGNOSIS — I152 Hypertension secondary to endocrine disorders: Secondary | ICD-10-CM

## 2023-10-07 DIAGNOSIS — N529 Male erectile dysfunction, unspecified: Secondary | ICD-10-CM

## 2023-10-07 DIAGNOSIS — Z87898 Personal history of other specified conditions: Secondary | ICD-10-CM

## 2023-10-07 LAB — GLUCOSE, POCT (MANUAL RESULT ENTRY): POC Glucose: 128 mg/dL — AB (ref 70–99)

## 2023-10-07 LAB — POCT GLYCOSYLATED HEMOGLOBIN (HGB A1C): HbA1c, POC (controlled diabetic range): 6.4 % (ref 0.0–7.0)

## 2023-10-07 MED ORDER — SILDENAFIL CITRATE 100 MG PO TABS
50.0000 mg | ORAL_TABLET | Freq: Every day | ORAL | 11 refills | Status: AC | PRN
  Filled 2023-10-07: qty 10, 15d supply, fill #0
  Filled 2023-11-11: qty 10, 15d supply, fill #1
  Filled 2024-01-15 – 2024-02-04 (×2): qty 10, 15d supply, fill #2
  Filled 2024-03-23 – 2024-04-09 (×3): qty 10, 15d supply, fill #3
  Filled 2024-04-09: qty 10, 15d supply, fill #0

## 2023-10-07 NOTE — Progress Notes (Addendum)
 Patient ID: [name], male    DOB: Mar 02, 1976  MRN: [id number]  CC: Hypertension (HTN f/u. Suellen upcoming PT appointment 10/09/23/)   Subjective: [name] is a 48 y.o. male who presents for chronic ds management. His concerns today include:  Hx of poorly controlled  HTN, thyroid  nodules (bx RT mid and lower 2024/endocrine [facility]. Benign), Vit D def,  preDM, tob dep, poor dentition, chronic lower back pain, history of reflex syncope, callus of left Achilles tendon, nasopharyngeal CA/sarcoma.   Discussed the use of AI scribe software for clinical note transcription with the patient, who gave verbal consent to proceed.  History of Present Illness [name] is a 48 year old male who presents for follow-up.  HTN: He has hypertension and is taking amlodipine , spironolactone , and losartan  regularly. He sometimes delays taking carvedilol  until after meals due to queasiness.  He has not taken the carvedilol  as yet for today because he wanted to eat first.  He has not been consistently checking his blood pressure but notes no significant issues recently. He experienced an excessive headache when he ran low on medication recently.  DM:  Results for orders placed or performed in visit on 10/07/23  POCT glucose (manual entry)   Collection Time: 10/07/23 11:57 AM  Result Value Ref Range   POC Glucose 128 (A) 70 - 99 mg/dl  POCT glycosylated hemoglobin (Hb A1C)   Collection Time: 10/07/23 12:02 PM  Result Value Ref Range   Hemoglobin A1C     HbA1c POC (<> result, manual entry)     HbA1c, POC (prediabetic range)     HbA1c, POC (controlled diabetic range) 6.4 0.0 - 7.0 %  Regarding his diabetes, he has managed to lower his A1c from 7.2 to 6.4 through dietary changes, primarily by reducing soda intake to one per day and increasing water consumption.  Metformin  was prescribed on last visit and he was referred to nutritionist.  He has not started metformin  due to concerns  about side effects and is attempting to control his blood sugar through diet alone.  He has seen the nutritionist and has found it helpful  He continues to take Eliquis  for his history of blood clots, although he occasionally forgets his second dose. No bruising or bleeding.  Has follow-up appointment with hematologist in September.  He has a history of nasopharyngeal cancer and underwent scans in June, which showed no evidence of recurrence.   He experiences leg pain in his left hip/groin, and an x-ray showed moderate osteoarthritis changes; he is considering an injection for pain management. He has lost weight, now weighing 205 pounds, down from 208 pounds in March.  He is experiencing difficulty obtaining dental care.  He has seen a dentist who has subsequently referred him to another dentist to have extractions and then dentures and a partial.  However he is been calling unsuccessfully to get appointment with the dentist that she referred him to.    He also mentions needing to reschedule a urology appointment and has not yet completed a colon cancer screening stool kit.    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   Pulmonary embolism on right (HCC) 09/24/2022   Nasal cavity mass 09/24/2022   Acute deep vein thrombosis (DVT) of femoral vein of right  lower extremity (HCC) 08/21/2022   Neck pain 08/14/2021   Prediabetes 01/28/2021   Vitamin D  deficiency 01/28/2021   Essential hypertension 09/08/2020   Pre-ulcerative corn or callous 02/01/2019   Disorder of left Achilles tendon 02/01/2019   Inguinal hernia of left side without obstruction or gangrene 12/08/2016   Former tobacco use 12/08/2016   Localized swelling, mass and lump, multiple sites 12/08/2016   Subcutaneous nodules 07/09/2016    Periodontitis 05/27/2015   Pain in left wrist 05/27/2015   HTN (hypertension) 05/12/2015     Current Outpatient Medications on File Prior to Visit  Medication Sig Dispense Refill   Accu-Chek Softclix Lancets lancets Use as instructed to check blood sugars once a day 100 each 12   acetaminophen  (TYLENOL ) 500 MG tablet Take 500 mg by mouth every 6 (six) hours as needed for moderate pain (pain score 4-6).     amLODipine  (NORVASC ) 10 MG tablet Take 1 tablet (10 mg total) by mouth daily. 90 tablet 1   apixaban  (ELIQUIS ) 5 MG TABS tablet Take 1 tablet (5 mg total) by mouth 2 (two) times daily. 60 tablet 11   Blood Glucose Monitoring Suppl (ACCU-CHEK GUIDE) w/Device KIT Use to check blood sugars once a day 1 kit 0   budesonide  (PULMICORT ) 0.5 MG/2ML nebulizer solution Mix 4 mLs (1 mg total/2 respules) itno 240 ml of nasal saline irrigation once daily. 120 mL 11   carvedilol  (COREG ) 25 MG tablet Take 1 tablet (25 mg total) by mouth 2 (two) times daily with a meal. 180 tablet 1   glucose blood (ACCU-CHEK GUIDE TEST) test strip Use as instructed to check blood sugars once a day 100 each 12   losartan  (COZAAR ) 100 MG tablet Take 1 tablet (100 mg total) by mouth daily. 90 tablet 1   methimazole  (TAPAZOLE ) 5 MG tablet Take 0.5 tablets (2.5 mg total) by mouth daily.     methocarbamol  (ROBAXIN ) 750 MG tablet Take 1 tablet (750 mg total) by mouth 2 (two) times daily as needed for muscle spasms. 60 tablet 1   polyethylene glycol powder (GLYCOLAX /MIRALAX ) 17 GM/SCOOP powder Take 17 g (1 capful) and mix in 8 oz of clear liquid and drink by mouth daily. 238 g 0   predniSONE  (STERAPRED UNI-PAK 21 TAB) 10 MG (21) TBPK tablet Take following package directions 21 tablet 0   spironolactone  (ALDACTONE ) 50 MG tablet Take 1 tablet (50 mg total) by mouth daily. 90 tablet 1   clindamycin  (CLEOCIN ) 150 MG capsule Take 2 capsules (300 mg total) by mouth 4 (four) times daily. (Patient not taking: Reported on 10/07/2023) 56  capsule 0   gabapentin (NEURONTIN) 300 MG capsule Take 300 mg by mouth 3 (three) times daily. (Patient not taking: Reported on 10/07/2023)     HYDROcodone -acetaminophen  (NORCO/VICODIN) 5-325 MG tablet Take 1-2 tablets by mouth every 6 (six) hours as needed for severe pain (pain score 7-10). (Patient not taking: Reported on 10/07/2023) 12 tablet 0   metFORMIN  (GLUCOPHAGE ) 500 MG tablet Take 1 tablet (500 mg total) by mouth daily for 14 days, THEN 1 tablet (500 mg total) 2 (two) times daily. (Patient not taking: No sig reported) 180 tablet 3   No current facility-administered medications on file prior to visit.    Allergies  Allergen Reactions   Doxycycline  Other (See Comments)    Possible allergen. Developed Bell's Palsy the day after taking antibiotic.   Penicillins Swelling and Rash    Social History   Socioeconomic History   Marital status:  Single    Spouse name: Not on file   Number of children: 3   Years of education: Not on file   Highest education level: Some college, no degree  Occupational History   Occupation: manual labor  Tobacco Use   Smoking status: Former    Types: Cigars   Smokeless tobacco: Never   Tobacco comments:    Occ black and  milds   Vaping Use   Vaping status: Never Used  Substance and Sexual Activity   Alcohol use: Not Currently   Drug use: Not Currently    Types: Marijuana   Sexual activity: Not on file  Other Topics Concern   Not on file  Social History Narrative   Not on file   Social Drivers of Health   Financial Resource Strain: Medium Risk (04/09/2023)   Overall Financial Resource Strain (CARDIA)    Difficulty of Paying Living Expenses: Somewhat hard  Food Insecurity: No Food Insecurity (07/25/2023)   Hunger Vital Sign    Worried About Running Out of Food in the Last Year: Never true    Ran Out of Food in the Last Year: Never true  Transportation Needs: No Transportation Needs (05/16/2023)   PRAPARE - Scientist, research (physical sciences) (Medical): No    Lack of Transportation (Non-Medical): No  Physical Activity: Sufficiently Active (04/09/2023)   Exercise Vital Sign    Days of Exercise per Week: 4 days    Minutes of Exercise per Session: 60 min  Stress: No Stress Concern Present (04/09/2023)   [employer] of Occupational Health - Occupational Stress Questionnaire    Feeling of Stress : Only a little  Social Connections: Unknown (04/09/2023)   Social Connection and Isolation Panel    Frequency of Communication with Friends and Family: More than three times a week    Frequency of Social Gatherings with Friends and Family: Twice a week    Attends Religious Services: Never    Database administrator or Organizations: No    Attends Engineer, structural: Not on file    Marital Status: Patient declined  Intimate Partner Violence: Not At Risk (05/16/2023)   Humiliation, Afraid, Rape, and Kick questionnaire    Fear of Current or Ex-Partner: No    Emotionally Abused: No    Physically Abused: No    Sexually Abused: No    Family History  Problem Relation Age of Onset   Cancer Mother        breast cancer    Past Surgical History:  Procedure Laterality Date   CHOLECYSTECTOMY     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    ROS: Review of Systems Negative except as stated above  PHYSICAL EXAM: BP 119/84 (BP Location: Left Arm, Patient Position: Sitting, Cuff Size: Normal)   Pulse 72   Ht 6' 2 (1.88 m)   Wt 205 lb (93 kg)   SpO2 98%   BMI 26.32 kg/m   Wt Readings from Last 3 Encounters:  10/07/23 205 lb (93 kg)  09/19/23 208 lb (94.3 kg)  07/25/23 207 lb (93.9 kg)    Physical Exam   General appearance - alert, well appearing, and in no distress Mental status - normal mood, behavior, speech, dress, motor activity, and thought processes Chest - clear to auscultation, no wheezes, rales or rhonchi, symmetric air entry Heart - normal rate, regular rhythm, normal S1,  S2, no murmurs, rubs, clicks or gallops Extremities - peripheral pulses normal,  no pedal edema, no clubbing or cyanosis     Latest Ref Rng & Units 05/16/2023    5:18 AM 05/15/2023    4:55 PM 03/29/2023    7:27 PM  CMP  Glucose 70 - 99 mg/dL 818  879  888   BUN 6 - 20 mg/dL 10  8  10    Creatinine 0.61 - 1.24 mg/dL 9.03  9.15  9.10   Sodium 135 - 145 mmol/L 134  136  134   Potassium 3.5 - 5.1 mmol/L 3.5  3.9  4.1   Chloride 98 - 111 mmol/L 103  101  98   CO2 22 - 32 mmol/L 25  26  25    Calcium 8.9 - 10.3 mg/dL 8.8  9.4  9.3   Total Protein 6.5 - 8.1 g/dL 7.1  8.6  8.5   Total Bilirubin 0.0 - 1.2 mg/dL 0.7  0.4  0.2   Alkaline Phos 38 - 126 U/L 84  102  99   AST 15 - 41 U/L 12  15  13    ALT 0 - 44 U/L 16  17  15     Lipid Panel     Component Value Date/Time   CHOL 129 10/19/2020 1136   TRIG 114 10/19/2020 1136   HDL 39 (L) 10/19/2020 1136   CHOLHDL 3.3 10/19/2020 1136   LDLCALC 69 10/19/2020 1136    CBC    Component Value Date/Time   WBC 6.8 05/17/2023 0453   RBC 4.88 05/17/2023 0453   HGB 13.7 05/17/2023 0453   HGB 15.1 11/13/2022 1149   HGB 17.4 04/05/2022 1010   HCT 40.7 05/17/2023 0453   HCT 51.9 (H) 04/05/2022 1010   PLT 259 05/17/2023 0453   PLT 300 11/13/2022 1149   PLT 285 04/05/2022 1010   MCV 83.4 05/17/2023 0453   MCV 86 04/05/2022 1010   MCH 28.1 05/17/2023 0453   MCHC 33.7 05/17/2023 0453   RDW 14.1 05/17/2023 0453   RDW 12.9 04/05/2022 1010   LYMPHSABS 3.5 05/16/2023 0518   LYMPHSABS 2.7 04/05/2022 1010   MONOABS 0.6 05/16/2023 0518   EOSABS 0.1 05/16/2023 0518   EOSABS 0.1 04/05/2022 1010   BASOSABS 0.1 05/16/2023 0518   BASOSABS 0.0 04/05/2022 1010    ASSESSMENT AND PLAN: 1. Type 2 diabetes mellitus with other specified complication, without long-term current use of insulin (HCC) (Primary) At goal and diet controlled.  Commended him on making significant changes in his eating habits.  Encouraged him to keep up the good work's. - POCT glycosylated  hemoglobin (Hb A1C) - POCT glucose (manual entry)  2. Hypertension associated with type 2 diabetes mellitus (HCC) Close to goal.  He has not taken the carvedilol  as yet for the morning.  He will do so when he returns home and after eating.  Continue Norvasc  10 mg daily, spironolactone  50 mg daily and Cozaar  100 mg daily  3. 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  Continue Eliquis   4. Primary osteoarthritis of left hip Weight loss will help.  Patient considering injection to the hip through ortho  5. Erectile dysfunction, unspecified erectile dysfunction type Patient requested refill on Viagra  - sildenafil  (VIAGRA ) 100 MG tablet; Take 0.5-1 tablets (50-100 mg total) by mouth daily as needed for erectile dysfunction.  Dispense: 10 tablet; Refill: 11  6. Dental cavities Advised patient to call his dentist back and let her know that he is having difficulty scheduling with the dentist/oral surgeon that she wants him  to see.  Maybe she can refer him to someone different.  7. History of gross hematuria Patient to call [facility] and reschedule his appointment.  8. History of nasopharyngeal cancer Being followed and [facility].  Recent imaging studies showed no signs of recurrence.   Patient was given the opportunity to ask questions.  Patient verbalized understanding of the plan and was able to repeat key elements of the plan.   This documentation was completed using Paediatric nurse.  Any transcriptional errors are unintentional.  Orders Placed This Encounter  Procedures   POCT glycosylated hemoglobin (Hb A1C)   POCT glucose (manual entry)     Requested Prescriptions   Signed Prescriptions Disp Refills   sildenafil  (VIAGRA ) 100 MG tablet 10 tablet 11    Sig: Take 0.5-1 tablets (50-100 mg total) by mouth daily as needed for erectile dysfunction.    Return in about 4 months (around  02/07/2024).  Barnie Louder, MD, FACP

## 2023-10-07 NOTE — Patient Instructions (Signed)
 VISIT SUMMARY:  [name], you came in today for a follow-up visit. We discussed your hypertension, diabetes, history of blood clots, nasopharyngeal cancer, osteoarthritis, and dental issues. We also reviewed your general health maintenance and follow-up appointments.  YOUR PLAN:  -HYPERTENSION: Your blood pressure is well-controlled with your current medications. Hypertension means high blood pressure, which can lead to serious health problems if not managed. Continue taking your medications as prescribed, especially carvedilol  with food to avoid queasiness, and monitor your blood pressure regularly.  -TYPE 2 DIABETES MELLITUS: Your A1c has improved to 6.4%, which is great progress. Type 2 diabetes is a condition where your blood sugar levels are too high. Continue managing your diet, monitor your blood sugar regularly, and hold off on starting metformin  unless your blood sugar control worsens.  -VENOUS THROMBOEMBOLISM: You are taking Eliquis  for your history of blood clots, which are likely related to your past cancer. Venous thromboembolism refers to blood clots in the veins. Continue taking Eliquis  as prescribed, discuss your dosage and adherence with your hematologist, and consider using a pill box to help remember your doses.  -NASOPHARYNGEAL CANCER: Your recent scans show no recurrence of cancer. Nasopharyngeal cancer is a type of cancer that occurs in the upper part of your throat. Continue with your oncology follow-ups and schedule your next scan in January.  -OSTEOARTHRITIS OF THE LEFT HIP: You have moderate arthritis in your left hip, confirmed by x-ray. Osteoarthritis is a condition where the protective cartilage in your joints wears down over time. Attend physical therapy on July 31, discuss a hip joint injection with orthopedics, and consider weight management to help with your symptoms.  -DENTAL ISSUES: You need upper dentures and bottom partials but have had difficulty securing an  appointment. Continue trying to contact Dr. Vola office for an appointment or seek an alternative provider.  -GENERAL HEALTH MAINTENANCE: You need to complete your colon cancer screening with the stool kit. Regular screenings are important for early detection and prevention of colon cancer.  INSTRUCTIONS:  Please contact [facility] to reschedule your appointment. Follow up with Dr. Lonn on September 23. Ensure all your specialist appointments are scheduled and attended.

## 2023-10-10 DIAGNOSIS — M1612 Unilateral primary osteoarthritis, left hip: Secondary | ICD-10-CM | POA: Diagnosis not present

## 2023-10-15 ENCOUNTER — Telehealth: Admitting: Physician Assistant

## 2023-10-15 DIAGNOSIS — M1612 Unilateral primary osteoarthritis, left hip: Secondary | ICD-10-CM | POA: Diagnosis not present

## 2023-10-15 NOTE — Patient Instructions (Signed)
 [name] Single, thank you for joining [name], PA-C for today's virtual visit.  While this provider is not your primary care provider (PCP), if your PCP is located in our provider database this encounter information will be shared with them immediately following your visit.   A [facility] MyChart account gives you access to today's visit and all your visits, tests, and labs performed at [facility]  click here if you don't have a  MyChart account or go to [email or web address]  Consent: (Patient) [name] Single provided verbal consent for this virtual visit at the beginning of the encounter.  Current Medications:  Current Outpatient Medications:    Accu-Chek Softclix Lancets lancets, Use as instructed to check blood sugars once a day, Disp: 100 each, Rfl: 12   acetaminophen  (TYLENOL ) 500 MG tablet, Take 500 mg by mouth every 6 (six) hours as needed for moderate pain (pain score 4-6)., Disp: , Rfl:    amLODipine  (NORVASC ) 10 MG tablet, Take 1 tablet (10 mg total) by mouth daily., Disp: 90 tablet, Rfl: 1   apixaban  (ELIQUIS ) 5 MG TABS tablet, Take 1 tablet (5 mg total) by mouth 2 (two) times daily., Disp: 60 tablet, Rfl: 11   Blood Glucose Monitoring Suppl (ACCU-CHEK GUIDE) w/Device KIT, Use to check blood sugars once a day, Disp: 1 kit, Rfl: 0   budesonide  (PULMICORT ) 0.5 MG/2ML nebulizer solution, Mix 4 mLs (1 mg total/2 respules) itno 240 ml of nasal saline irrigation once daily., Disp: 120 mL, Rfl: 11   carvedilol  (COREG ) 25 MG tablet, Take 1 tablet (25 mg total) by mouth 2 (two) times daily with a meal., Disp: 180 tablet, Rfl: 1   clindamycin  (CLEOCIN ) 150 MG capsule, Take 2 capsules (300 mg total) by mouth 4 (four) times daily. (Patient not taking: Reported on 10/07/2023), Disp: 56 capsule, Rfl: 0   gabapentin (NEURONTIN) 300 MG capsule, Take 300 mg by mouth 3 (three) times daily. (Patient not taking: Reported on 10/07/2023), Disp: , Rfl:    glucose  blood (ACCU-CHEK GUIDE TEST) test strip, Use as instructed to check blood sugars once a day, Disp: 100 each, Rfl: 12   HYDROcodone -acetaminophen  (NORCO/VICODIN) 5-325 MG tablet, Take 1-2 tablets by mouth every 6 (six) hours as needed for severe pain (pain score 7-10). (Patient not taking: Reported on 10/07/2023), Disp: 12 tablet, Rfl: 0   losartan  (COZAAR ) 100 MG tablet, Take 1 tablet (100 mg total) by mouth daily., Disp: 90 tablet, Rfl: 1   metFORMIN  (GLUCOPHAGE ) 500 MG tablet, Take 1 tablet (500 mg total) by mouth daily for 14 days, THEN 1 tablet (500 mg total) 2 (two) times daily. (Patient not taking: No sig reported), Disp: 180 tablet, Rfl: 3   methimazole  (TAPAZOLE ) 5 MG tablet, Take 0.5 tablets (2.5 mg total) by mouth daily., Disp: , Rfl:    methocarbamol  (ROBAXIN ) 750 MG tablet, Take 1 tablet (750 mg total) by mouth 2 (two) times daily as needed for muscle spasms., Disp: 60 tablet, Rfl: 1   polyethylene glycol powder (GLYCOLAX /MIRALAX ) 17 GM/SCOOP powder, Take 17 g (1 capful) and mix in 8 oz of clear liquid and drink by mouth daily., Disp: 238 g, Rfl: 0   sildenafil  (VIAGRA ) 100 MG tablet, Take 0.5-1 tablets (50-100 mg total) by mouth daily as needed for erectile dysfunction., Disp: 10 tablet, Rfl: 11   spironolactone  (ALDACTONE ) 50 MG tablet, Take 1 tablet (50 mg total) by mouth daily., Disp: 90 tablet, Rfl: 1   Medications ordered in this encounter:  No orders of the defined types were placed in this encounter.    *If you need refills on other medications prior to your next appointment, please contact your pharmacy*  Follow-Up: Call back or seek an in-person evaluation if the symptoms worsen or if the condition fails to improve as anticipated.  [facility] [phone]  Other Instructions  Osteoarthritis  Osteoarthritis is a type of arthritis. It refers to joint pain or joint disease. Osteoarthritis affects tissue that covers the ends of bones in joints (cartilage).  Cartilage acts as a cushion between the bones and helps them move smoothly. Osteoarthritis occurs when cartilage in the joints gets worn down. Osteoarthritis is sometimes called wear and tear arthritis. Osteoarthritis is the most common form of arthritis. It often occurs in older people. It is a condition that gets worse over time. The joints most often affected by this condition are in the fingers, toes, hips, knees, and spine, including the neck and lower back. What are the causes? This condition is caused by the wearing down of cartilage that covers the ends of bones. What increases the risk? The following factors may make you more likely to develop this condition: Being age 51 or older. Obesity. Overuse of joints. Past injury of a joint. Past surgery on a joint. Family history of osteoarthritis. What are the signs or symptoms? The main symptoms of this condition are pain, swelling, and stiffness in the joint. Other symptoms may include: An enlarged joint. More pain and further damage caused by small pieces of bone or cartilage that break off and float inside of the joint. Small deposits of bone (osteophytes) that grow on the edges of the joint. A grating or scraping feeling inside the joint when you move it. Popping or creaking sounds when you move. Difficulty walking or exercising. An inability to grip items, twist your hand, or control the movements of your hands and fingers. How is this diagnosed? This condition may be diagnosed based on: Your medical history. A physical exam. Your symptoms. X-rays of the affected joints. Blood tests to rule out other types of arthritis. How is this treated? There is no cure for this condition, but treatment can help control pain and improve joint function. Treatment may include a combination of therapies, such as: Pain relief techniques, such as: Applying heat and cold to the joint. Massage. A form of talk therapy called cognitive  behavioral therapy (CBT). This therapy helps you set goals and follow up on the changes that you make. Medicines for pain and inflammation. The medicines can be taken by mouth or applied to the skin. They include: NSAIDs, such as ibuprofen . Prescription medicines. Strong anti-inflammatory medicines (corticosteroids). Certain nutritional supplements. A prescribed exercise program. You may work with a physical therapist. Assistive devices, such as a brace, wrap, splint, specialized glove, or cane. A weight control plan. Surgery, such as: An osteotomy. This is done to reposition the bones and relieve pain or to remove loose pieces of bone and cartilage. Joint replacement surgery. You may need this surgery if you have advanced osteoarthritis. Follow these instructions at home: Activity Rest your affected joints as told by your health care provider. Exercise as told by your provider. The provider may recommend specific types of exercise, such as: Strengthening exercises. These are done to strengthen the muscles that support joints affected by arthritis. Aerobic activities. These are exercises, such as brisk walking or water aerobics, that increase your heart rate. Range-of-motion activities. These help your joints move  more easily. Balance and agility exercises. Managing pain, stiffness, and swelling     If told, apply heat to the affected area as often as told by your provider. Use the heat source that your provider recommends, such as a moist heat pack or a heating pad. If you have a removable assistive device, remove it as told by your provider. Place a towel between your skin and the heat source. If your provider tells you to keep the assistive device on while you apply heat, place a towel between the assistive device and the heat source. Leave the heat on for 20-30 minutes. If told, put ice on the affected area. If you have a removable assistive device, remove it as told by your  provider. Put ice in a plastic bag. Place a towel between your skin and the bag. If your provider tells you to keep the assistive device on during icing, place a towel between the assistive device and the bag. Leave the ice on for 20 minutes, 2-3 times a day. If your skin turns bright red, remove the ice or heat right away to prevent skin damage. The risk of damage is higher if you cannot feel pain, heat, or cold. Move your fingers or toes often to reduce stiffness and swelling. Raise (elevate) the affected area above the level of your heart while you are sitting or lying down. General instructions Take over-the-counter and prescription medicines only as told by your provider. Maintain a healthy weight. Follow instructions from your provider for weight control. Do not use any products that contain nicotine or tobacco. These products include cigarettes, chewing tobacco, and vaping devices, such as e-cigarettes. If you need help quitting, ask your provider. Use assistive devices as told by your provider. Where to find more information [employer] of Arthritis and Musculoskeletal and Skin Diseases: niams.http://www.myers.net/ [employer] on Aging: BaseRingTones.pl American College of Rheumatology: rheumatology.org Contact a health care provider if: You have redness, swelling, or a feeling of warmth in a joint that gets worse. You have a fever along with joint or muscle aches. You develop a rash. You have trouble doing your normal activities. You have pain that gets worse and is not relieved by pain medicine. This information is not intended to replace advice given to you by your health care provider. Make sure you discuss any questions you have with your health care provider. Document Revised: 10/26/2021 Document Reviewed: 10/26/2021 Elsevier Patient Education  2024 Elsevier Inc.   If you have been instructed to have an in-person evaluation today at a local Urgent Care facility, please use the  link below. It will take you to a list of all of our available [facility], including address, phone number and hours of operation. Please do not delay care.  [facility]  If you or a family member do not have a primary care provider, use the link below to schedule a visit and establish care. When you choose a [facility] primary care physician or advanced practice provider, you gain a long-term partner in health. Find a Primary Care Provider  Learn more about [facility]'s in-office and virtual care options: [facility] - Get Care Now

## 2023-10-15 NOTE — Progress Notes (Signed)
 Virtual Visit Consent   SAGE KOPERA, you are scheduled for a virtual visit with a [facility] provider today. Just as with appointments in the office, your consent must be obtained to participate. Your consent will be active for this visit and any virtual visit you may have with one of our providers in the next 365 days. If you have a MyChart account, a copy of this consent can be sent to you electronically.  As this is a virtual visit, video technology does not allow for your provider to perform a traditional examination. This may limit your provider's ability to fully assess your condition. If your provider identifies any concerns that need to be evaluated in person or the need to arrange testing (such as labs, EKG, etc.), we will make arrangements to do so. Although advances in technology are sophisticated, we cannot ensure that it will always work on either your end or our end. If the connection with a video visit is poor, the visit may have to be switched to a telephone visit. With either a video or telephone visit, we are not always able to ensure that we have a secure connection.  By engaging in this virtual visit, you consent to the provision of healthcare and authorize for your insurance to be billed (if applicable) for the services provided during this visit. Depending on your insurance coverage, you may receive a charge related to this service.  I need to obtain your verbal consent now. Are you willing to proceed with your visit today? RAUNAK ANTUNA has provided verbal consent on 10/15/2023 for a virtual visit (video or telephone). [name], PA-C  Date: 10/15/2023 4:40 PM   Virtual Visit via Video Note   I, [name], connected with  MEDHANSH BRINKMEIER  ([id number], [birth year]) on 10/15/23 at  4:30 PM EDT by a video-enabled telemedicine application and verified that I am speaking with the correct person using two identifiers.  Location: Patient: Virtual Visit Location  Patient: Home Provider: Virtual Visit Location Provider: Home Office   I discussed the limitations of evaluation and management by telemedicine and the availability of in person appointments. The patient expressed understanding and agreed to proceed.    History of Present Illness: TRAJON ROSETE is a 48 y.o. who identifies as a male who was assigned male at birth, and is being seen today for acute on chronic pain of the left hip. Has seen Ortho and was diagnosed with primary osteoarthritis of the left hip. Has started PT. Feeling increased pain after PT. Reports he attempted home exercises and hip extension with leg extension that caused pain. Has tried Tylenol  but has not had relief of the pain yet.    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   Pulmonary embolism on right (HCC) 09/24/2022   Nasal cavity mass 09/24/2022   Acute deep vein thrombosis (DVT) of femoral vein of right lower extremity (HCC) 08/21/2022   Neck pain 08/14/2021   Prediabetes 01/28/2021   Vitamin D  deficiency 01/28/2021   Essential hypertension 09/08/2020   Pre-ulcerative corn or callous 02/01/2019   Disorder of left Achilles tendon 02/01/2019   Inguinal hernia of left side without obstruction or gangrene 12/08/2016   Former tobacco use 12/08/2016   Localized swelling, mass and lump, multiple  sites 12/08/2016   Subcutaneous nodules 07/09/2016   Periodontitis 05/27/2015   Pain in left wrist 05/27/2015   HTN (hypertension) 05/12/2015    Allergies:  Allergies  Allergen Reactions   Doxycycline  Other (See Comments)    Possible allergen. Developed Bell's Palsy the day after taking antibiotic.   Penicillins Swelling and Rash   Medications:  Current Outpatient Medications:    Accu-Chek Softclix Lancets lancets,  Use as instructed to check blood sugars once a day, Disp: 100 each, Rfl: 12   acetaminophen  (TYLENOL ) 500 MG tablet, Take 500 mg by mouth every 6 (six) hours as needed for moderate pain (pain score 4-6)., Disp: , Rfl:    amLODipine  (NORVASC ) 10 MG tablet, Take 1 tablet (10 mg total) by mouth daily., Disp: 90 tablet, Rfl: 1   apixaban  (ELIQUIS ) 5 MG TABS tablet, Take 1 tablet (5 mg total) by mouth 2 (two) times daily., Disp: 60 tablet, Rfl: 11   Blood Glucose Monitoring Suppl (ACCU-CHEK GUIDE) w/Device KIT, Use to check blood sugars once a day, Disp: 1 kit, Rfl: 0   budesonide  (PULMICORT ) 0.5 MG/2ML nebulizer solution, Mix 4 mLs (1 mg total/2 respules) itno 240 ml of nasal saline irrigation once daily., Disp: 120 mL, Rfl: 11   carvedilol  (COREG ) 25 MG tablet, Take 1 tablet (25 mg total) by mouth 2 (two) times daily with a meal., Disp: 180 tablet, Rfl: 1   clindamycin  (CLEOCIN ) 150 MG capsule, Take 2 capsules (300 mg total) by mouth 4 (four) times daily. (Patient not taking: Reported on 10/07/2023), Disp: 56 capsule, Rfl: 0   gabapentin (NEURONTIN) 300 MG capsule, Take 300 mg by mouth 3 (three) times daily. (Patient not taking: Reported on 10/07/2023), Disp: , Rfl:    glucose blood (ACCU-CHEK GUIDE TEST) test strip, Use as instructed to check blood sugars once a day, Disp: 100 each, Rfl: 12   HYDROcodone -acetaminophen  (NORCO/VICODIN) 5-325 MG tablet, Take 1-2 tablets by mouth every 6 (six) hours as needed for severe pain (pain score 7-10). (Patient not taking: Reported on 10/07/2023), Disp: 12 tablet, Rfl: 0   losartan  (COZAAR ) 100 MG tablet, Take 1 tablet (100 mg total) by mouth daily., Disp: 90 tablet, Rfl: 1   metFORMIN  (GLUCOPHAGE ) 500 MG tablet, Take 1 tablet (500 mg total) by mouth daily for 14 days, THEN 1 tablet (500 mg total) 2 (two) times daily. (Patient not taking: No sig reported), Disp: 180 tablet, Rfl: 3   methimazole  (TAPAZOLE ) 5 MG tablet, Take 0.5 tablets (2.5 mg total) by mouth daily.,  Disp: , Rfl:    methocarbamol  (ROBAXIN ) 750 MG tablet, Take 1 tablet (750 mg total) by mouth 2 (two) times daily as needed for muscle spasms., Disp: 60 tablet, Rfl: 1   polyethylene glycol powder (GLYCOLAX /MIRALAX ) 17 GM/SCOOP powder, Take 17 g (1 capful) and mix in 8 oz of clear liquid and drink by mouth daily., Disp: 238 g, Rfl: 0   sildenafil  (VIAGRA ) 100 MG tablet, Take 0.5-1 tablets (50-100 mg total) by mouth daily as needed for erectile dysfunction., Disp: 10 tablet, Rfl: 11   spironolactone  (ALDACTONE ) 50 MG tablet, Take 1 tablet (50 mg total) by mouth daily., Disp: 90 tablet, Rfl: 1  Observations/Objective: Patient is well-developed, well-nourished in no acute distress.  Resting comfortably at home.  Head is normocephalic, atraumatic.  No labored breathing.  Speech is clear and coherent with logical content.  Patient is alert and oriented at baseline.    Assessment and Plan: 1. Primary osteoarthritis of left hip (Primary)  -  Acute on chronic pain of left hip flared after attempting home PT exercises - Continue tylenol  as needed - He has Robaxin  at home, advised for him to use this for muscle spasms from exercise; Can take Robaxin  750mg  1 tablet every 6-8 hours as needed  - Ice hip  - Rest - Work note provided - Seek in person evaluation if worsening or fails to improve  Follow Up Instructions: I discussed the assessment and treatment plan with the patient. The patient was provided an opportunity to ask questions and all were answered. The patient agreed with the plan and demonstrated an understanding of the instructions.  A copy of instructions were sent to the patient via MyChart unless otherwise noted below.    The patient was advised to call back or seek an in-person evaluation if the symptoms worsen or if the condition fails to improve as anticipated.    [name], PA-C

## 2023-10-16 DIAGNOSIS — E042 Nontoxic multinodular goiter: Secondary | ICD-10-CM | POA: Diagnosis not present

## 2023-10-16 DIAGNOSIS — Z9889 Other specified postprocedural states: Secondary | ICD-10-CM | POA: Diagnosis not present

## 2023-10-16 DIAGNOSIS — Z7901 Long term (current) use of anticoagulants: Secondary | ICD-10-CM | POA: Diagnosis not present

## 2023-10-16 DIAGNOSIS — C801 Malignant (primary) neoplasm, unspecified: Secondary | ICD-10-CM | POA: Diagnosis not present

## 2023-10-16 DIAGNOSIS — Z86711 Personal history of pulmonary embolism: Secondary | ICD-10-CM | POA: Diagnosis not present

## 2023-10-16 DIAGNOSIS — C490 Malignant neoplasm of connective and soft tissue of head, face and neck: Secondary | ICD-10-CM | POA: Diagnosis not present

## 2023-10-16 DIAGNOSIS — E0590 Thyrotoxicosis, unspecified without thyrotoxic crisis or storm: Secondary | ICD-10-CM | POA: Diagnosis not present

## 2023-10-26 ENCOUNTER — Telehealth: Admitting: Physician Assistant

## 2023-10-26 ENCOUNTER — Other Ambulatory Visit (HOSPITAL_COMMUNITY): Payer: Self-pay

## 2023-10-26 DIAGNOSIS — M25552 Pain in left hip: Secondary | ICD-10-CM

## 2023-10-26 MED ORDER — PREDNISONE 10 MG (21) PO TBPK
ORAL_TABLET | ORAL | 0 refills | Status: DC
  Filled 2023-10-26: qty 21, 6d supply, fill #0

## 2023-10-26 NOTE — Progress Notes (Signed)
 Virtual Visit Consent   [name], you are scheduled for a virtual visit with a [facility] provider today. Just as with appointments in the office, your consent must be obtained to participate. Your consent will be active for this visit and any virtual visit you may have with one of our providers in the next 365 days. If you have a MyChart account, a copy of this consent can be sent to you electronically.  As this is a virtual visit, video technology does not allow for your provider to perform a traditional examination. This may limit your provider's ability to fully assess your condition. If your provider identifies any concerns that need to be evaluated in person or the need to arrange testing (such as labs, EKG, etc.), we will make arrangements to do so. Although advances in technology are sophisticated, we cannot ensure that it will always work on either your end or our end. If the connection with a video visit is poor, the visit may have to be switched to a telephone visit. With either a video or telephone visit, we are not always able to ensure that we have a secure connection.  By engaging in this virtual visit, you consent to the provision of healthcare and authorize for your insurance to be billed (if applicable) for the services provided during this visit. Depending on your insurance coverage, you may receive a charge related to this service.  I need to obtain your verbal consent now. Are you willing to proceed with your visit today? [name] has provided verbal consent on 10/26/2023 for a virtual visit (video or telephone). [name], [place]  Date: 10/26/2023 10:32 AM   Virtual Visit via Video Note   I, [name], connected with  [name]  ([id number], 1975/07/06) on 10/26/23 at 10:15 AM EDT by a video-enabled telemedicine application and verified that I am speaking with the correct person using two identifiers.  Location: Patient: Virtual Visit  Location Patient: Home Provider: Virtual Visit Location Provider: Home Office   I discussed the limitations of evaluation and management by telemedicine and the availability of in person appointments. The patient expressed understanding and agreed to proceed.    History of Present Illness: [name] is a 48 y.o. who identifies as a male who was assigned male at birth, and is being seen today for continued and progressive L hip pain since last visit with both his orthopedist and after recent telehealth visit on 8/8.  Notes this has been an ongoing issue for him.  It has been recommended that he receive a hip injection.  Has been trying to continue his physical therapy along with using muscle relaxers at directed. Notes working 9 hours yesterday which is the longest he has worked in a shift in sometime dealing with the hip pain.  Notes L hip swelling and increased pain after shift Thursday. Tried to take it eay yesterday having use Iced, rested, muscle relaxant. Some improvement but still swelling noted.  Was unable to go into work today secondary to the pain.  Is going to try to call his specialist Monday morning to give the approval for the injection, as he states he is tired of dealing with this.   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   Pulmonary embolism on right (HCC) 09/24/2022   Nasal cavity mass 09/24/2022   Acute deep vein thrombosis (DVT) of femoral vein of right lower extremity (HCC) 08/21/2022   Neck pain 08/14/2021   Prediabetes 01/28/2021   Vitamin D  deficiency 01/28/2021   Essential hypertension 09/08/2020   Pre-ulcerative corn or callous 02/01/2019   Disorder of left Achilles tendon 02/01/2019   Inguinal hernia of left side without obstruction or gangrene  12/08/2016   Former tobacco use 12/08/2016   Localized swelling, mass and lump, multiple sites 12/08/2016   Subcutaneous nodules 07/09/2016   Periodontitis 05/27/2015   Pain in left wrist 05/27/2015   HTN (hypertension) 05/12/2015    Allergies:  Allergies  Allergen Reactions   Doxycycline  Other (See Comments)    Possible allergen. Developed Bell's Palsy the day after taking antibiotic.   Penicillins Swelling and Rash   Medications:  Current Outpatient Medications:    predniSONE  (STERAPRED UNI-PAK 21 TAB) 10 MG (21) TBPK tablet, Take following package directions, Disp: 21 tablet, Rfl: 0   Accu-Chek Softclix Lancets lancets, Use as instructed to check blood sugars once a day, Disp: 100 each, Rfl: 12   acetaminophen  (TYLENOL ) 500 MG tablet, Take 500 mg by mouth every 6 (six) hours as needed for moderate pain (pain score 4-6)., Disp: , Rfl:    amLODipine  (NORVASC ) 10 MG tablet, Take 1 tablet (10 mg total) by mouth daily., Disp: 90 tablet, Rfl: 1   apixaban  (ELIQUIS ) 5 MG TABS tablet, Take 1 tablet (5 mg total) by mouth 2 (two) times daily., Disp: 60 tablet, Rfl: 11   Blood Glucose Monitoring Suppl (ACCU-CHEK GUIDE) w/Device KIT, Use to check blood sugars once a day, Disp: 1 kit, Rfl: 0   budesonide  (PULMICORT ) 0.5 MG/2ML nebulizer solution, Mix 4 mLs (1 mg total/2 respules) itno 240 ml of nasal saline irrigation once daily., Disp: 120 mL, Rfl: 11   carvedilol  (COREG ) 25 MG tablet, Take 1 tablet (25 mg total) by mouth 2 (two) times daily with a meal., Disp: 180 tablet, Rfl: 1   clindamycin  (CLEOCIN ) 150 MG capsule, Take 2 capsules (300 mg total) by mouth 4 (four) times daily. (Patient not taking: Reported on 10/07/2023), Disp: 56 capsule, Rfl: 0   gabapentin (NEURONTIN) 300 MG capsule, Take 300 mg by mouth 3 (three) times daily. (Patient not taking: Reported on 10/07/2023), Disp: , Rfl:    glucose blood (ACCU-CHEK GUIDE TEST) test strip, Use as instructed to check blood sugars once a day, Disp:  100 each, Rfl: 12   HYDROcodone -acetaminophen  (NORCO/VICODIN) 5-325 MG tablet, Take 1-2 tablets by mouth every 6 (six) hours as needed for severe pain (pain score 7-10). (Patient not taking: Reported on 10/07/2023), Disp: 12 tablet, Rfl: 0   losartan  (COZAAR ) 100 MG tablet, Take 1 tablet (100 mg total) by mouth daily., Disp: 90 tablet, Rfl: 1   metFORMIN  (GLUCOPHAGE ) 500 MG tablet, Take 1 tablet (500 mg total) by mouth daily for 14 days, THEN 1 tablet (500 mg total) 2 (two) times daily. (Patient not taking: No sig reported), Disp: 180 tablet, Rfl: 3   methimazole  (TAPAZOLE ) 5 MG tablet, Take 0.5 tablets (2.5 mg total) by mouth daily., Disp: , Rfl:    methocarbamol  (ROBAXIN ) 750 MG tablet, Take 1 tablet (750 mg total) by mouth 2 (two) times daily as needed for muscle spasms., Disp: 60 tablet, Rfl: 1   polyethylene glycol powder (GLYCOLAX /MIRALAX ) 17 GM/SCOOP powder, Take 17 g (1 capful) and mix in 8 oz  of clear liquid and drink by mouth daily., Disp: 238 g, Rfl: 0   sildenafil  (VIAGRA ) 100 MG tablet, Take 0.5-1 tablets (50-100 mg total) by mouth daily as needed for erectile dysfunction., Disp: 10 tablet, Rfl: 11   spironolactone  (ALDACTONE ) 50 MG tablet, Take 1 tablet (50 mg total) by mouth daily., Disp: 90 tablet, Rfl: 1  Observations/Objective: Patient is well-developed, well-nourished in no acute distress.  Resting comfortably at home.  Head is normocephalic, atraumatic.  No labored breathing.  Speech is clear and coherent with logical content.  Patient is alert and oriented at baseline.   Assessment and Plan: 1. Left hip pain (Primary)  Ongoing.  History of osteoarthritis and bursitis.  Continue supportive measures including rest, ice and elevation of the area.  Okay to continue muscle now.  Will place of prednisone  pack on file in case symptoms do not continue to ease up over the weekend with conservative measures.  Unfortunately cannot prescribe other medication options such as NSAIDs (due  to Eliquis  use) or tramadol  (controlled medications), so discussed if anything is worsening over the weekend despite treatments given, he needs to seek an in person evaluation ASAP.  Follow Up Instructions: I discussed the assessment and treatment plan with the patient. The patient was provided an opportunity to ask questions and all were answered. The patient agreed with the plan and demonstrated an understanding of the instructions.  A copy of instructions were sent to the patient via MyChart unless otherwise noted below.   The patient was advised to call back or seek an in-person evaluation if the symptoms worsen or if the condition fails to improve as anticipated.    [name] Velma Lunger, PA-C

## 2023-10-26 NOTE — Patient Instructions (Addendum)
 [name] Single, thank you for joining [name], PA-C for today's virtual visit.  While this provider is not your primary care provider (PCP), if your PCP is located in our provider database this encounter information will be shared with them immediately following your visit.   A [facility] MyChart account gives you access to today's visit and all your visits, tests, and labs performed at [facility]  click here if you don't have a [facility] MyChart account or go to [email or web address]  Consent: (Patient) [name] Single provided verbal consent for this virtual visit at the beginning of the encounter.  Current Medications:  Current Outpatient Medications:    Accu-Chek Softclix Lancets lancets, Use as instructed to check blood sugars once a day, Disp: 100 each, Rfl: 12   acetaminophen  (TYLENOL ) 500 MG tablet, Take 500 mg by mouth every 6 (six) hours as needed for moderate pain (pain score 4-6)., Disp: , Rfl:    amLODipine  (NORVASC ) 10 MG tablet, Take 1 tablet (10 mg total) by mouth daily., Disp: 90 tablet, Rfl: 1   apixaban  (ELIQUIS ) 5 MG TABS tablet, Take 1 tablet (5 mg total) by mouth 2 (two) times daily., Disp: 60 tablet, Rfl: 11   Blood Glucose Monitoring Suppl (ACCU-CHEK GUIDE) w/Device KIT, Use to check blood sugars once a day, Disp: 1 kit, Rfl: 0   budesonide  (PULMICORT ) 0.5 MG/2ML nebulizer solution, Mix 4 mLs (1 mg total/2 respules) itno 240 ml of nasal saline irrigation once daily., Disp: 120 mL, Rfl: 11   carvedilol  (COREG ) 25 MG tablet, Take 1 tablet (25 mg total) by mouth 2 (two) times daily with a meal., Disp: 180 tablet, Rfl: 1   clindamycin  (CLEOCIN ) 150 MG capsule, Take 2 capsules (300 mg total) by mouth 4 (four) times daily. (Patient not taking: Reported on 10/07/2023), Disp: 56 capsule, Rfl: 0   gabapentin (NEURONTIN) 300 MG capsule, Take 300 mg by mouth 3 (three) times daily. (Patient not taking: Reported on 10/07/2023), Disp: , Rfl:    glucose  blood (ACCU-CHEK GUIDE TEST) test strip, Use as instructed to check blood sugars once a day, Disp: 100 each, Rfl: 12   HYDROcodone -acetaminophen  (NORCO/VICODIN) 5-325 MG tablet, Take 1-2 tablets by mouth every 6 (six) hours as needed for severe pain (pain score 7-10). (Patient not taking: Reported on 10/07/2023), Disp: 12 tablet, Rfl: 0   losartan  (COZAAR ) 100 MG tablet, Take 1 tablet (100 mg total) by mouth daily., Disp: 90 tablet, Rfl: 1   metFORMIN  (GLUCOPHAGE ) 500 MG tablet, Take 1 tablet (500 mg total) by mouth daily for 14 days, THEN 1 tablet (500 mg total) 2 (two) times daily. (Patient not taking: No sig reported), Disp: 180 tablet, Rfl: 3   methimazole  (TAPAZOLE ) 5 MG tablet, Take 0.5 tablets (2.5 mg total) by mouth daily., Disp: , Rfl:    methocarbamol  (ROBAXIN ) 750 MG tablet, Take 1 tablet (750 mg total) by mouth 2 (two) times daily as needed for muscle spasms., Disp: 60 tablet, Rfl: 1   polyethylene glycol powder (GLYCOLAX /MIRALAX ) 17 GM/SCOOP powder, Take 17 g (1 capful) and mix in 8 oz of clear liquid and drink by mouth daily., Disp: 238 g, Rfl: 0   sildenafil  (VIAGRA ) 100 MG tablet, Take 0.5-1 tablets (50-100 mg total) by mouth daily as needed for erectile dysfunction., Disp: 10 tablet, Rfl: 11   spironolactone  (ALDACTONE ) 50 MG tablet, Take 1 tablet (50 mg total) by mouth daily., Disp: 90 tablet, Rfl: 1   Medications ordered in this encounter:  No orders of the defined types were placed in this encounter.    *If you need refills on other medications prior to your next appointment, please contact your pharmacy*  Follow-Up: Call back or seek an in-person evaluation if the symptoms worsen or if the condition fails to improve as anticipated.  [facility] [phone]  Other Instructions Please continue to rest and ice area. Try to elevate the area when possible. Okay to continue Tylenol . I have placed a steroid pack on file to start if you notice anything not  continuing to improve over the weekend. Follow-up with your specialist first thing Monday morning. If anything worsens over the weekend, please seek an in person evaluation ASAP (see below).  [facility]   Monday - Friday   11AM-7PM   Schedule via website (see below) or as walk-in   [address], [address]   [place], [place] [address]   [phone]   [facility]  [place], [place]  [place]      [facility]   [address],  [place], [place] [address]   GET DIRECTIONS   CONTACT   Phone: [phone]   Phone: [phone]   URGENT CARE HOURS   Monday - Friday:   8:00am to 8:00pm   Saturday:   10:00am to 3:00pm         [facility]   [address]  [place], [place] [address]   GET DIRECTIONS   CONTACT   Phone: [phone]   URGENT CARE HOURS   Monday:   9:00AM - 9:00PM   Tuesday:   9:00AM - 9:00PM   Wednesday:   9:00AM - 9:00PM   Thursday:   9:00AM - 9:00PM   Friday:   9:00AM - 9:00PM   Saturday:   9:00AM - 9:00PM   Sunday:   9:00AM - 9:00PM   HOLIDAY HOURS   Holidays:   8:30AM - 4:30PM      [facility]   After Hours [facility] [phone]      [facility] [address] [place], [place] [address]      EVENINGS & WEEKENDS NO APPOINTMENT NECESSARY Mon-Fri 5:30PM - 9PM    Sat 9 AM - 2 PM Sun 10 AM - 2 PM      [facility]   [address]  [address]  [place], [place] [address]   [phone]      OFFICE HOURS:   MONDAY - FRIDAY : 8:00 A.M. - 4:00 P.M.      [facility]  [address]  [place], [place] [address]   [phone]      OFFICE HOURS:   MONDAY - FRIDAY: 8:00 A.M. - 8:30 P.M.   SATURDAY: 10:00 A.M. - 2:00 P.M.           If you have been instructed to have an in-person  evaluation today at a local Urgent Care facility, please use the link below. It will take you to a list of all of our available [facility], including address, phone number and hours of operation. Please do not delay care.  [facility]  If you or a family member do not have a primary care provider, use the link below to schedule a visit and establish care. When you choose a [facility] primary care physician or advanced practice provider, you gain a long-term partner in health. Find a  Primary Care Provider  Learn more about [facility]'s in-office and virtual care options: [facility] - Get Care Now

## 2023-10-29 ENCOUNTER — Telehealth: Admitting: Physician Assistant

## 2023-10-29 DIAGNOSIS — Z0289 Encounter for other administrative examinations: Secondary | ICD-10-CM | POA: Diagnosis not present

## 2023-10-29 DIAGNOSIS — M25552 Pain in left hip: Secondary | ICD-10-CM | POA: Diagnosis not present

## 2023-10-29 NOTE — Patient Instructions (Signed)
 [name] Single, thank you for joining [name] Velma Lunger, PA-C for today's virtual visit.  While this provider is not your primary care provider (PCP), if your PCP is located in our provider database this encounter information will be shared with them immediately following your visit.   A [facility] MyChart account gives you access to today's visit and all your visits, tests, and labs performed at [facility]  click here if you don't have a [facility] MyChart account or go to [email or web address]  Consent: (Patient) [name] Single provided verbal consent for this virtual visit at the beginning of the encounter.  Current Medications:  Current Outpatient Medications:    Accu-Chek Softclix Lancets lancets, Use as instructed to check blood sugars once a day, Disp: 100 each, Rfl: 12   acetaminophen  (TYLENOL ) 500 MG tablet, Take 500 mg by mouth every 6 (six) hours as needed for moderate pain (pain score 4-6)., Disp: , Rfl:    amLODipine  (NORVASC ) 10 MG tablet, Take 1 tablet (10 mg total) by mouth daily., Disp: 90 tablet, Rfl: 1   apixaban  (ELIQUIS ) 5 MG TABS tablet, Take 1 tablet (5 mg total) by mouth 2 (two) times daily., Disp: 60 tablet, Rfl: 11   Blood Glucose Monitoring Suppl (ACCU-CHEK GUIDE) w/Device KIT, Use to check blood sugars once a day, Disp: 1 kit, Rfl: 0   budesonide  (PULMICORT ) 0.5 MG/2ML nebulizer solution, Mix 4 mLs (1 mg total/2 respules) itno 240 ml of nasal saline irrigation once daily., Disp: 120 mL, Rfl: 11   carvedilol  (COREG ) 25 MG tablet, Take 1 tablet (25 mg total) by mouth 2 (two) times daily with a meal., Disp: 180 tablet, Rfl: 1   clindamycin  (CLEOCIN ) 150 MG capsule, Take 2 capsules (300 mg total) by mouth 4 (four) times daily. (Patient not taking: Reported on 10/07/2023), Disp: 56 capsule, Rfl: 0   gabapentin (NEURONTIN) 300 MG capsule, Take 300 mg by mouth 3 (three) times daily. (Patient not taking: Reported on 10/07/2023), Disp: , Rfl:    glucose  blood (ACCU-CHEK GUIDE TEST) test strip, Use as instructed to check blood sugars once a day, Disp: 100 each, Rfl: 12   HYDROcodone -acetaminophen  (NORCO/VICODIN) 5-325 MG tablet, Take 1-2 tablets by mouth every 6 (six) hours as needed for severe pain (pain score 7-10). (Patient not taking: Reported on 10/07/2023), Disp: 12 tablet, Rfl: 0   losartan  (COZAAR ) 100 MG tablet, Take 1 tablet (100 mg total) by mouth daily., Disp: 90 tablet, Rfl: 1   metFORMIN  (GLUCOPHAGE ) 500 MG tablet, Take 1 tablet (500 mg total) by mouth daily for 14 days, THEN 1 tablet (500 mg total) 2 (two) times daily. (Patient not taking: No sig reported), Disp: 180 tablet, Rfl: 3   methimazole  (TAPAZOLE ) 5 MG tablet, Take 0.5 tablets (2.5 mg total) by mouth daily., Disp: , Rfl:    methocarbamol  (ROBAXIN ) 750 MG tablet, Take 1 tablet (750 mg total) by mouth 2 (two) times daily as needed for muscle spasms., Disp: 60 tablet, Rfl: 1   polyethylene glycol powder (GLYCOLAX /MIRALAX ) 17 GM/SCOOP powder, Take 17 g (1 capful) and mix in 8 oz of clear liquid and drink by mouth daily., Disp: 238 g, Rfl: 0   predniSONE  (STERAPRED UNI-PAK 21 TAB) 10 MG (21) TBPK tablet, Take following package directions, Disp: 21 tablet, Rfl: 0   sildenafil  (VIAGRA ) 100 MG tablet, Take 0.5-1 tablets (50-100 mg total) by mouth daily as needed for erectile dysfunction., Disp: 10 tablet, Rfl: 11   spironolactone  (ALDACTONE ) 50 MG tablet,  Take 1 tablet (50 mg total) by mouth daily., Disp: 90 tablet, Rfl: 1   Medications ordered in this encounter:  No orders of the defined types were placed in this encounter.    *If you need refills on other medications prior to your next appointment, please contact your pharmacy*  Follow-Up: Call back or seek an in-person evaluation if the symptoms worsen or if the condition fails to improve as anticipated.  [facility] [phone]  Other Instructions Please call your specialist office now for next steps in  case. I have provided a final work note for today, but no further work excused can be given from our virtual urgent care team, so it is important you follow-up with your regular providers ASAP.   If you have been instructed to have an in-person evaluation today at a local Urgent Care facility, please use the link below. It will take you to a list of all of our available [facility], including address, phone number and hours of operation. Please do not delay care.  [facility]  If you or a family member do not have a primary care provider, use the link below to schedule a visit and establish care. When you choose a [facility] primary care physician or advanced practice provider, you gain a long-term partner in health. Find a Primary Care Provider  Learn more about [facility]'s in-office and virtual care options: [facility] - Get Care Now

## 2023-10-29 NOTE — Progress Notes (Signed)
 Virtual Visit Consent   [name], you are scheduled for a virtual visit with a [facility] provider today. Just as with appointments in the office, your consent must be obtained to participate. Your consent will be active for this visit and any virtual visit you may have with one of our providers in the next 365 days. If you have a MyChart account, a copy of this consent can be sent to you electronically.  As this is a virtual visit, video technology does not allow for your provider to perform a traditional examination. This may limit your provider's ability to fully assess your condition. If your provider identifies any concerns that need to be evaluated in person or the need to arrange testing (such as labs, EKG, etc.), we will make arrangements to do so. Although advances in technology are sophisticated, we cannot ensure that it will always work on either your end or our end. If the connection with a video visit is poor, the visit may have to be switched to a telephone visit. With either a video or telephone visit, we are not always able to ensure that we have a secure connection.  By engaging in this virtual visit, you consent to the provision of healthcare and authorize for your insurance to be billed (if applicable) for the services provided during this visit. Depending on your insurance coverage, you may receive a charge related to this service.  I need to obtain your verbal consent now. Are you willing to proceed with your visit today? [name] has provided verbal consent on 10/29/2023 for a virtual visit (video or telephone). [name], [place]  Date: 10/29/2023 3:16 PM   Virtual Visit via Video Note   I, [name], connected with  [name]  ([id number], May 12, 1975) on 10/29/23 at  3:15 PM EDT by a video-enabled telemedicine application and verified that I am speaking with the correct person using two identifiers.  Location: Patient: Virtual Visit  Location Patient: Home Provider: Virtual Visit Location Provider: Home Office   I discussed the limitations of evaluation and management by telemedicine and the availability of in person appointments. The patient expressed understanding and agreed to proceed.    History of Present Illness: [name] is a 48 y.o. who identifies as a male who was assigned male at birth, and is being seen today for request for work note for today. Notes he worked this AM but cannot work this evening due to worsening symptoms after his first shift today. Has been dealing with subacute/chronic left hip pain, followed by sports medicine. Is trying to get injections set up. Is taking all prescribed medications as directed. Has next PT in 2 days.   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   Pulmonary embolism on right (HCC) 09/24/2022   Nasal cavity mass 09/24/2022   Acute deep vein thrombosis (DVT) of femoral vein of right lower extremity (HCC) 08/21/2022   Neck pain 08/14/2021   Prediabetes 01/28/2021   Vitamin D  deficiency 01/28/2021   Essential hypertension 09/08/2020   Pre-ulcerative corn or callous 02/01/2019   Disorder of left Achilles tendon 02/01/2019   Inguinal hernia of left side without obstruction or gangrene 12/08/2016   Former tobacco use 12/08/2016   Localized swelling,  mass and lump, multiple sites 12/08/2016   Subcutaneous nodules 07/09/2016   Periodontitis 05/27/2015   Pain in left wrist 05/27/2015   HTN (hypertension) 05/12/2015    Allergies:  Allergies  Allergen Reactions   Doxycycline  Other (See Comments)    Possible allergen. Developed Bell's Palsy the day after taking antibiotic.   Penicillins Swelling and Rash   Medications:  Current Outpatient Medications:     Accu-Chek Softclix Lancets lancets, Use as instructed to check blood sugars once a day, Disp: 100 each, Rfl: 12   acetaminophen  (TYLENOL ) 500 MG tablet, Take 500 mg by mouth every 6 (six) hours as needed for moderate pain (pain score 4-6)., Disp: , Rfl:    amLODipine  (NORVASC ) 10 MG tablet, Take 1 tablet (10 mg total) by mouth daily., Disp: 90 tablet, Rfl: 1   apixaban  (ELIQUIS ) 5 MG TABS tablet, Take 1 tablet (5 mg total) by mouth 2 (two) times daily., Disp: 60 tablet, Rfl: 11   Blood Glucose Monitoring Suppl (ACCU-CHEK GUIDE) w/Device KIT, Use to check blood sugars once a day, Disp: 1 kit, Rfl: 0   budesonide  (PULMICORT ) 0.5 MG/2ML nebulizer solution, Mix 4 mLs (1 mg total/2 respules) itno 240 ml of nasal saline irrigation once daily., Disp: 120 mL, Rfl: 11   carvedilol  (COREG ) 25 MG tablet, Take 1 tablet (25 mg total) by mouth 2 (two) times daily with a meal., Disp: 180 tablet, Rfl: 1   clindamycin  (CLEOCIN ) 150 MG capsule, Take 2 capsules (300 mg total) by mouth 4 (four) times daily. (Patient not taking: Reported on 10/07/2023), Disp: 56 capsule, Rfl: 0   gabapentin (NEURONTIN) 300 MG capsule, Take 300 mg by mouth 3 (three) times daily. (Patient not taking: Reported on 10/07/2023), Disp: , Rfl:    glucose blood (ACCU-CHEK GUIDE TEST) test strip, Use as instructed to check blood sugars once a day, Disp: 100 each, Rfl: 12   HYDROcodone -acetaminophen  (NORCO/VICODIN) 5-325 MG tablet, Take 1-2 tablets by mouth every 6 (six) hours as needed for severe pain (pain score 7-10). (Patient not taking: Reported on 10/07/2023), Disp: 12 tablet, Rfl: 0   losartan  (COZAAR ) 100 MG tablet, Take 1 tablet (100 mg total) by mouth daily., Disp: 90 tablet, Rfl: 1   metFORMIN  (GLUCOPHAGE ) 500 MG tablet, Take 1 tablet (500 mg total) by mouth daily for 14 days, THEN 1 tablet (500 mg total) 2 (two) times daily. (Patient not taking: No sig reported), Disp: 180 tablet, Rfl: 3   methimazole  (TAPAZOLE ) 5 MG tablet, Take 0.5 tablets  (2.5 mg total) by mouth daily., Disp: , Rfl:    methocarbamol  (ROBAXIN ) 750 MG tablet, Take 1 tablet (750 mg total) by mouth 2 (two) times daily as needed for muscle spasms., Disp: 60 tablet, Rfl: 1   polyethylene glycol powder (GLYCOLAX /MIRALAX ) 17 GM/SCOOP powder, Take 17 g (1 capful) and mix in 8 oz of clear liquid and drink by mouth daily., Disp: 238 g, Rfl: 0   predniSONE  (STERAPRED UNI-PAK 21 TAB) 10 MG (21) TBPK tablet, Take following package directions, Disp: 21 tablet, Rfl: 0   sildenafil  (VIAGRA ) 100 MG tablet, Take 0.5-1 tablets (50-100 mg total) by mouth daily as needed for erectile dysfunction., Disp: 10 tablet, Rfl: 11   spironolactone  (ALDACTONE ) 50 MG tablet, Take 1 tablet (50 mg total) by mouth daily., Disp: 90 tablet, Rfl: 1  Observations/Objective: Patient is well-developed, well-nourished in no acute distress.  Resting comfortably  at home.  Head is normocephalic, atraumatic.  No labored breathing.  Speech is clear and  coherent with logical content.  Patient is alert and oriented at baseline.    Assessment and Plan: 1. Left hip pain (Primary)  Has been seen by our team several times now for this issue.  Is managed by a specialist and discussed from this point forward all management and work accommodations are to come from specialist. No further work notes will be provided from our team after today. Will send note to cover for his second shift today to allow him time to rest and call his specialist for next steps in care.  Follow Up Instructions: I discussed the assessment and treatment plan with the patient. The patient was provided an opportunity to ask questions and all were answered. The patient agreed with the plan and demonstrated an understanding of the instructions.  A copy of instructions were sent to the patient via MyChart unless otherwise noted below.    The patient was advised to call back or seek an in-person evaluation if the symptoms worsen or if the  condition fails to improve as anticipated.    [name] Velma Lunger, PA-C

## 2023-11-05 ENCOUNTER — Other Ambulatory Visit (HOSPITAL_COMMUNITY): Payer: Self-pay

## 2023-11-07 DIAGNOSIS — H524 Presbyopia: Secondary | ICD-10-CM | POA: Diagnosis not present

## 2023-11-07 DIAGNOSIS — H401134 Primary open-angle glaucoma, bilateral, indeterminate stage: Secondary | ICD-10-CM | POA: Diagnosis not present

## 2023-11-07 DIAGNOSIS — H5213 Myopia, bilateral: Secondary | ICD-10-CM | POA: Diagnosis not present

## 2023-11-07 DIAGNOSIS — E119 Type 2 diabetes mellitus without complications: Secondary | ICD-10-CM | POA: Diagnosis not present

## 2023-11-07 DIAGNOSIS — H52223 Regular astigmatism, bilateral: Secondary | ICD-10-CM | POA: Diagnosis not present

## 2023-11-12 ENCOUNTER — Other Ambulatory Visit (HOSPITAL_COMMUNITY): Payer: Self-pay

## 2023-11-14 DIAGNOSIS — R29898 Other symptoms and signs involving the musculoskeletal system: Secondary | ICD-10-CM | POA: Diagnosis not present

## 2023-11-14 DIAGNOSIS — M1612 Unilateral primary osteoarthritis, left hip: Secondary | ICD-10-CM | POA: Diagnosis not present

## 2023-11-18 DIAGNOSIS — R6882 Decreased libido: Secondary | ICD-10-CM | POA: Diagnosis not present

## 2023-11-18 DIAGNOSIS — R310 Gross hematuria: Secondary | ICD-10-CM | POA: Diagnosis not present

## 2023-11-21 ENCOUNTER — Ambulatory Visit: Admitting: Dietician

## 2023-11-27 ENCOUNTER — Ambulatory Visit: Admitting: Dietician

## 2023-11-29 ENCOUNTER — Encounter: Payer: Self-pay | Admitting: Internal Medicine

## 2023-12-02 ENCOUNTER — Ambulatory Visit: Payer: Self-pay

## 2023-12-02 ENCOUNTER — Other Ambulatory Visit: Payer: Self-pay

## 2023-12-02 DIAGNOSIS — I2699 Other pulmonary embolism without acute cor pulmonale: Secondary | ICD-10-CM

## 2023-12-02 NOTE — Telephone Encounter (Signed)
 Patient was scheduled for Friday 12/06/2023 with  PCP.

## 2023-12-02 NOTE — Telephone Encounter (Signed)
 FYI Only or Action Required?: Action required by provider: request for appointment and update on patient condition.  Patient was last seen in primary care on 10/29/2023 by [name] Elsie BROCKS, PA-C.  Called Nurse Triage reporting Shoulder Pain.  Symptoms began several weeks ago.  Interventions attempted: OTC medications: Tylenol  and Rest, hydration, or home remedies.  Symptoms are: unchanged.  Triage Disposition: See PCP When Office is Open (Within 3 Days)  Patient/caregiver understands and will follow disposition?: Unsure   Copied from CRM #[id number]. Topic: Clinical - Red Word Triage >> Dec 02, 2023 10:21 AM Adelita E wrote: [facility] that prompted transfer to Nurse Triage: Right shoulder pain that also has a numbness tingling sensation down that same arm. Reason for Disposition  [1] MODERATE pain (e.g., interferes with normal activities) AND [2] present > 3 days  Answer Assessment - Initial Assessment Questions Additional info: Next available acute visit at practice location is not until October 8, patient declines acute visit at alternate regional office and will proceed to urgent care. Patient also shares he messaged PCP on Friday re: shoulder pain and has not heard back.   1. ONSET: When did the pain start?     Couple  2. LOCATION: Where is the pain located?     Right shoulder 3. PAIN: How bad is the pain? (Scale 1-10; or mild, moderate, severe)     Worse when raising arm. Tylenol  helps relieve the pain.  4. WORK OR EXERCISE: Has there been any recent work or exercise that involved this part of the body?     no 5. CAUSE: What do you think is causing the shoulder pain?     Unsure.  6. OTHER SYMPTOMS: Do you have any other symptoms? (e.g., neck pain, swelling, rash, fever, numbness, weakness)     Tingling down arm 1.5 weeks  Protocols used: Shoulder Pain-A-AH

## 2023-12-02 NOTE — Telephone Encounter (Signed)
 fyi

## 2023-12-03 ENCOUNTER — Inpatient Hospital Stay

## 2023-12-03 ENCOUNTER — Inpatient Hospital Stay: Admitting: Hematology and Oncology

## 2023-12-03 NOTE — Telephone Encounter (Signed)
 Appointment scheduled for 12/06/2023 to address concerns.

## 2023-12-04 ENCOUNTER — Other Ambulatory Visit: Payer: Self-pay

## 2023-12-05 ENCOUNTER — Other Ambulatory Visit: Payer: Self-pay

## 2023-12-06 ENCOUNTER — Ambulatory Visit: Admitting: Internal Medicine

## 2023-12-10 ENCOUNTER — Inpatient Hospital Stay (HOSPITAL_BASED_OUTPATIENT_CLINIC_OR_DEPARTMENT_OTHER): Admitting: Hematology and Oncology

## 2023-12-10 ENCOUNTER — Encounter: Payer: Self-pay | Admitting: Hematology and Oncology

## 2023-12-10 ENCOUNTER — Inpatient Hospital Stay: Attending: Hematology and Oncology

## 2023-12-10 VITALS — BP 122/92 | HR 75 | Temp 98.3°F | Resp 18 | Ht 74.0 in | Wt 208.8 lb

## 2023-12-10 DIAGNOSIS — Z86718 Personal history of other venous thrombosis and embolism: Secondary | ICD-10-CM | POA: Diagnosis not present

## 2023-12-10 DIAGNOSIS — Z8522 Personal history of malignant neoplasm of nasal cavities, middle ear, and accessory sinuses: Secondary | ICD-10-CM | POA: Diagnosis not present

## 2023-12-10 DIAGNOSIS — I2699 Other pulmonary embolism without acute cor pulmonale: Secondary | ICD-10-CM

## 2023-12-10 DIAGNOSIS — Z86711 Personal history of pulmonary embolism: Secondary | ICD-10-CM | POA: Diagnosis not present

## 2023-12-10 LAB — CBC WITH DIFFERENTIAL (CANCER CENTER ONLY)
Abs Immature Granulocytes: 0.03 K/uL (ref 0.00–0.07)
Basophils Absolute: 0.1 K/uL (ref 0.0–0.1)
Basophils Relative: 1 %
Eosinophils Absolute: 0.1 K/uL (ref 0.0–0.5)
Eosinophils Relative: 1 %
HCT: 42.7 % (ref 39.0–52.0)
Hemoglobin: 14.8 g/dL (ref 13.0–17.0)
Immature Granulocytes: 0 %
Lymphocytes Relative: 36 %
Lymphs Abs: 2.6 K/uL (ref 0.7–4.0)
MCH: 28.4 pg (ref 26.0–34.0)
MCHC: 34.7 g/dL (ref 30.0–36.0)
MCV: 82 fL (ref 80.0–100.0)
Monocytes Absolute: 0.5 K/uL (ref 0.1–1.0)
Monocytes Relative: 6 %
Neutro Abs: 3.9 K/uL (ref 1.7–7.7)
Neutrophils Relative %: 56 %
Platelet Count: 333 K/uL (ref 150–400)
RBC: 5.21 MIL/uL (ref 4.22–5.81)
RDW: 13.5 % (ref 11.5–15.5)
WBC Count: 7.2 K/uL (ref 4.0–10.5)
nRBC: 0 % (ref 0.0–0.2)

## 2023-12-10 LAB — CMP (CANCER CENTER ONLY)
ALT: 10 U/L (ref 0–44)
AST: 8 U/L — ABNORMAL LOW (ref 15–41)
Albumin: 3.7 g/dL (ref 3.5–5.0)
Alkaline Phosphatase: 94 U/L (ref 38–126)
Anion gap: 4 — ABNORMAL LOW (ref 5–15)
BUN: 10 mg/dL (ref 6–20)
CO2: 29 mmol/L (ref 22–32)
Calcium: 9.1 mg/dL (ref 8.9–10.3)
Chloride: 103 mmol/L (ref 98–111)
Creatinine: 0.91 mg/dL (ref 0.61–1.24)
GFR, Estimated: >60 mL/min (ref >60)
Glucose, Bld: 114 mg/dL — ABNORMAL HIGH (ref 70–99)
Potassium: 3.7 mmol/L (ref 3.5–5.1)
Sodium: 136 mmol/L (ref 135–145)
Total Bilirubin: 0.4 mg/dL (ref 0.0–1.2)
Total Protein: 7.7 g/dL (ref 6.5–8.1)

## 2023-12-10 NOTE — Assessment & Plan Note (Addendum)
 [name] is likely provoked by malignant process He tolerated anticoagulation therapy well The plan will be to continue treatment indefinitely He does not need long-term follow-up unless any perioperative anticoagulation management He can get future refills with his primary care doctor

## 2023-12-10 NOTE — Progress Notes (Signed)
 [facility] OFFICE PROGRESS NOTE  Patient Care Team: Vicci Barnie NOVAK, MD as PCP - General (Internal Medicine) Alvan Ronal BRAVO, MD (Inactive) as PCP - Cardiology (Cardiology)  Assessment & Plan Pulmonary embolism on right [name] is likely provoked by malignant process He tolerated anticoagulation therapy well The plan will be to continue treatment indefinitely He does not need long-term follow-up unless any perioperative anticoagulation management He can get future refills with his primary care doctor History of cancer of nasal cavity He is currently under active surveillance at [facility] and does not require adjuvant treatment after surgery Monitor closely  No orders of the defined types were placed in this encounter.    Almarie Bedford, MD  INTERVAL HISTORY: he returns for surveillance follow-up for history of DVT/PE Patient denies recent bleeding such as epistaxis, hematuria or hematochezia We reviewed medication list and discussed medication changes We discussed test results and future plan of care as outlined above  PHYSICAL EXAMINATION: ECOG PERFORMANCE STATUS: 0 - Asymptomatic  Vitals:   12/10/23 1508  BP: (!) 122/92  Pulse: 75  Resp: 18  Temp: 98.3 F (36.8 C)  SpO2: 100%   Lab Results  Component Value Date   WBC 7.2 12/10/2023   HGB 14.8 12/10/2023   HCT 42.7 12/10/2023   MCV 82.0 12/10/2023   PLT 333 12/10/2023    SUMMARY OF HEMATOLOGIC HISTORY:  [name] was seen here because of recent diagnosis of DVT and PE Patient presented to the emergency department on Jul 29, 2022 with right-sided Bell's palsy. 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  He denies recurrent nosebleeds or nasal discharge  In the meantime, he developed right lower extremity pain and swelling On August 18, 2022, venous Doppler ultrasound revealed findings consistent with occlusive thrombus within the distal RIGHT femoral vein, RIGHT popliteal vein, RIGHT posterior tibial vein and RIGHT peroneal vein. He was anticoagulated.  He also complained of chest pain. On 08/27/2022, he underwent CT angiogram which revealed large pulmonary embolism in the right PA. Extends into segmental and subsegmental branches.    He is doing well on Eliquis  and denies bleeding complications.  Gradually, his chest pain went away and leg swelling improves He denies recent history of trauma, long distance travel, dehydration, recent surgery, or prolonged immobilization.  He smoked cigars several times a week up to the diagnosis of blood clot but quit since then  He had prior surgeries before and never had perioperative thromboembolic events. The patient is not on testosterone replacement therapy  There is no family history of blood clots or miscarriages. Repeat venous Doppler ultrasound showed persistent changes in the lower extremity.  The patient tolerated anticoagulation therapy well The patient undergo surgery at [facility] and his anticoagulation was discontinued In March 2025, he was readmitted due to findings of acute DVT and PE

## 2023-12-10 NOTE — Assessment & Plan Note (Addendum)
 He is currently under active surveillance at [facility] and does not require adjuvant treatment after surgery Monitor closely

## 2023-12-12 ENCOUNTER — Encounter: Payer: Self-pay | Admitting: Dietician

## 2023-12-12 ENCOUNTER — Encounter: Attending: Internal Medicine | Admitting: Dietician

## 2023-12-12 VITALS — Wt 206.5 lb

## 2023-12-12 DIAGNOSIS — E119 Type 2 diabetes mellitus without complications: Secondary | ICD-10-CM | POA: Insufficient documentation

## 2023-12-12 NOTE — Progress Notes (Signed)
 Diabetes Self-Management Education  Visit Type: Follow-up  Appt. Start Time: 1415 Appt. End Time: 1445  12/12/2023  Mr. [name], identified by name and date of birth, is a 48 y.o. male with a diagnosis of Diabetes: type 2  .   ASSESSMENT  History includes: type 2 diabetes, hypertension Labs noted: 10/07/23 A1c 6.4% Medications include: metformin  (pt not taking) Supplements: none reported   Pt reports he has not started going to the gym but has started physical therapy and has been going once a week. Pt reports he has been trying to do the exercises at home and finds it helps with his work.   Pt reports he has been working to reduce sugar intake. Pt states he still has a mini can soda with lunch and dinner but switched his body armor to the light version.   Pt reports he has been eating dinner at home more, but still going out around 60% of the time.   Assessment of Previous Goals Established by Patient:    Goal 1: go to the gym 2 days per week. - goal not met, in PT, continue to work toward this goal.    Goal 2: limit soda to 1 mini can per day. - goal not met, continue!   Goal 3: aim for 64 oz water daily (4 16 oz bottles). - goal in progress, meets some days. Continue!   Goal 4: switch to body armor lite. - goal met, continue!  Weight 206 lb 8 oz (93.7 kg). Body mass index is 26.51 kg/m.   Diabetes Self-Management Education - 12/12/23 1415       Visit Information   Visit Type Follow-up      Health Coping   How would you rate your overall health? Good      Psychosocial Assessment   Patient Belief/Attitude about Diabetes Motivated to manage diabetes    What is the hardest part about your diabetes right now, causing you the most concern, or is the most worrisome to you about your diabetes?   Making healty food and beverage choices    Self-care barriers None    Self-management support Doctor's office    Other persons present Patient    Patient Concerns  Nutrition/Meal planning    Special Needs None    Preferred Learning Style No preference indicated    Learning Readiness Ready      Pre-Education Assessment   Patient understands the diabetes disease and treatment process. Needs Review    Patient understands incorporating nutritional management into lifestyle. Needs Review    Patient undertands incorporating physical activity into lifestyle. Needs Review    Patient understands using medications safely. Needs Review    Patient understands monitoring blood glucose, interpreting and using results Needs Review    Patient understands prevention, detection, and treatment of acute complications. Needs Review    Patient understands prevention, detection, and treatment of chronic complications. Needs Review    Patient understands how to develop strategies to address psychosocial issues. Needs Review    Patient understands how to develop strategies to promote health/change behavior. Needs Review      Complications   Last HgB A1C per patient/outside source 7.2 %    How often do you check your blood sugar? 3-4 times / week    Fasting Blood glucose range (mg/dL) 29-870      Dietary Intake   Breakfast 3 eggs and milk    Snack (morning) slice of pizza    Lunch spaghetti and salad  Snack (afternoon) none    Dinner cookout burger and fries    Snack (evening) none    Beverage(s) 2 sodas daily, 48 oz water, body armor light, milk      Activity / Exercise   Activity / Exercise Type ADL's      Patient Education   Previous Diabetes Education No    Disease Pathophysiology Explored patient's options for treatment of their diabetes    Healthy Eating Plate Method;Role of diet in the treatment of diabetes and the relationship between the three main macronutrients and blood glucose level;Reviewed blood glucose goals for pre and post meals and how to evaluate the patients' food intake on their blood glucose level.;Meal options for control of blood glucose  level and chronic complications.    Being Active Role of exercise on diabetes management, blood pressure control and cardiac health.;Helped patient identify appropriate exercises in relation to his/her diabetes, diabetes complications and other health issue.    Medications Reviewed patients medication for diabetes, action, purpose, timing of dose and side effects.    Monitoring Identified appropriate SMBG and/or A1C goals.    Chronic complications Relationship between chronic complications and blood glucose control;Identified and discussed with patient  current chronic complications    Diabetes Stress and Support Identified and addressed patients feelings and concerns about diabetes;Worked with patient to identify barriers to care and solutions;Role of stress on diabetes    Lifestyle and Health Coping Lifestyle issues that need to be addressed for better diabetes care      Individualized Goals (developed by patient)   Nutrition General guidelines for healthy choices and portions discussed    Physical Activity Exercise 1-2 times per week;30 minutes per day    Medications take my medication as prescribed    Monitoring  Test my blood glucose as discussed    Problem Solving Eating Pattern    Reducing Risk examine blood glucose patterns;do foot checks daily;treat hypoglycemia with 15 grams of carbs if blood glucose less than 70mg /dL    Health Coping Ask for help with psychological, social, or emotional issues      Patient Self-Evaluation of Goals - Patient rates self as meeting previously set goals (% of time)   Nutrition 50 - 75 % (half of the time)    Physical Activity < 25% (hardly ever/never)    Medications Not Applicable    Monitoring 50 - 75 % (half of the time)    Problem Solving and behavior change strategies  50 - 75 % (half of the time)    Reducing Risk (treating acute and chronic complications) 50 - 75 % (half of the time)    Health Coping 50 - 75 % (half of the time)       Post-Education Assessment   Patient understands the diabetes disease and treatment process. Comprehends key points    Patient understands incorporating nutritional management into lifestyle. Comprehends key points    Patient undertands incorporating physical activity into lifestyle. Comprehends key points    Patient understands using medications safely. Comphrehends key points    Patient understands monitoring blood glucose, interpreting and using results Comprehends key points    Patient understands prevention, detection, and treatment of acute complications. Comprehends key points    Patient understands prevention, detection, and treatment of chronic complications. Comprehends key points    Patient understands how to develop strategies to address psychosocial issues. Comprehends key points    Patient understands how to develop strategies to promote health/change behavior. Comprehends key points  Outcomes   Expected Outcomes Demonstrated interest in learning. Expect positive outcomes    Future DMSE PRN    Program Status Completed      Subsequent Visit   Since your last visit have you continued or begun to take your medications as prescribed? No          Individualized Plan for Diabetes Self-Management Training:   Learning Objective:  Patient will have a greater understanding of diabetes self-management. Patient education plan is to attend individual and/or group sessions per assessed needs and concerns.   Plan:   There are no Patient Instructions on file for this visit.  Expected Outcomes:  Demonstrated interest in learning. Expect positive outcomes  Education material provided: none on this follow up  If problems or questions, patient to contact team via:  Phone  Future DSME appointment: PRN

## 2023-12-17 ENCOUNTER — Ambulatory Visit: Attending: Internal Medicine | Admitting: Internal Medicine

## 2023-12-17 VITALS — BP 134/89 | HR 77 | Temp 98.0°F | Ht 74.0 in | Wt 207.0 lb

## 2023-12-17 DIAGNOSIS — M25511 Pain in right shoulder: Secondary | ICD-10-CM | POA: Diagnosis not present

## 2023-12-17 DIAGNOSIS — Z87891 Personal history of nicotine dependence: Secondary | ICD-10-CM

## 2023-12-17 DIAGNOSIS — E1159 Type 2 diabetes mellitus with other circulatory complications: Secondary | ICD-10-CM | POA: Diagnosis not present

## 2023-12-17 DIAGNOSIS — Z7901 Long term (current) use of anticoagulants: Secondary | ICD-10-CM

## 2023-12-17 DIAGNOSIS — I152 Hypertension secondary to endocrine disorders: Secondary | ICD-10-CM

## 2023-12-17 DIAGNOSIS — Z79899 Other long term (current) drug therapy: Secondary | ICD-10-CM | POA: Diagnosis not present

## 2023-12-17 NOTE — Progress Notes (Signed)
 Patient ID: [name], male    DOB: 1975/08/04  MRN: [id number]  CC: Shoulder Pain (R shoulder / arm pain. Med refill. Deanie prescribed anything today please send to [facility] on [address] & holden/No to flu vax)   Subjective: [name] is a 48 y.o. male who presents for RT shoulder pain His concerns today include:  Hx of poorly controlled  HTN, thyroid  nodules (bx RT mid and lower 2024/endocrine [facility]. Benign), Vit D def,  preDM, tob dep, poor dentition, chronic lower back pain, history of reflex syncope, callus of left Achilles tendon, nasopharyngeal CA/sarcoma.   Discussed the use of AI scribe software for clinical note transcription with the patient, who gave verbal consent to proceed.  History of Present Illness [name] is a 48 year old male who presents with right shoulder pain and numbness.  Right shoulder pain began in early September, initially as soreness with limited range of motion. A week later, numbness and tingling developed, radiating down the entire arm. While numbness and tingling have decreased, range of motion remains limited. Pain is exacerbated by certain movements, particularly lifting objects above the head, but not by lifting heavy objects at waist level. He cannot lay on the shoulder or push off of it without experiencing pain. The pain is described as sharp at times and sore at others, present about 70% of the time, especially with movement. 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  He uses Biofreeze topically, providing temporary relief for about three to four hours. He also takes Tylenol  and methocarbamol , although he is unsure of their effectiveness. He takes two Tylenol  (500 mg each) and methocarbamol  together, waiting six hours before taking more methocarbamol . There is no specific trauma or incident that could have caused the shoulder pain, and it developed gradually over time. The pain  does not prevent him from working, but he has to take his time with tasks involving lifting.  No neck pain, but some discomfort in the pectoral area. The shoulder area felt swollen on one occasion.   His past medical history includes hypertension, for which he is on multiple medications including carvedilol , amlodipine , losartan , and spironolactone . Has not taken Coreg  as yet ford today but took the others. Sometimes forgets to take a.m dose of Coreg ; makes him feel queasy if he takes it before eating breakfast but some days he rushes out of the door for work without getting breakfast and so he ends up having to take it later in the day.    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   Pulmonary embolism on right 09/24/2022   Nasal cavity mass 09/24/2022   Acute deep vein thrombosis (DVT) of femoral vein of right lower extremity (HCC) 08/21/2022   Neck pain 08/14/2021   Prediabetes 01/28/2021   Vitamin D  deficiency 01/28/2021   Essential hypertension 09/08/2020   Pre-ulcerative corn or callous 02/01/2019   Disorder of left Achilles tendon 02/01/2019   Inguinal hernia of left side without obstruction or gangrene 12/08/2016   Former tobacco use 12/08/2016   Localized swelling, mass and lump, multiple sites 12/08/2016   Subcutaneous nodules 07/09/2016   Periodontitis 05/27/2015   Pain in left wrist 05/27/2015   HTN (hypertension) 05/12/2015  Current Outpatient Medications on File Prior to Visit  Medication Sig Dispense Refill   Accu-Chek Softclix Lancets lancets Use as instructed to check blood sugars once a day 100 each 12   acetaminophen  (TYLENOL ) 500 MG tablet Take 500 mg by mouth every 6 (six) hours as needed for moderate pain (pain score 4-6).     amLODipine  (NORVASC ) 10 MG tablet Take 1  tablet (10 mg total) by mouth daily. 90 tablet 1   apixaban  (ELIQUIS ) 5 MG TABS tablet Take 1 tablet (5 mg total) by mouth 2 (two) times daily. 60 tablet 11   Blood Glucose Monitoring Suppl (ACCU-CHEK GUIDE) w/Device KIT Use to check blood sugars once a day 1 kit 0   budesonide  (PULMICORT ) 0.5 MG/2ML nebulizer solution Mix 4 mLs (1 mg total/2 respules) itno 240 ml of nasal saline irrigation once daily. 120 mL 11   carvedilol  (COREG ) 25 MG tablet Take 1 tablet (25 mg total) by mouth 2 (two) times daily with a meal. 180 tablet 1   glucose blood (ACCU-CHEK GUIDE TEST) test strip Use as instructed to check blood sugars once a day 100 each 12   losartan  (COZAAR ) 100 MG tablet Take 1 tablet (100 mg total) by mouth daily. 90 tablet 1   methimazole  (TAPAZOLE ) 5 MG tablet Take 0.5 tablets (2.5 mg total) by mouth daily.     methocarbamol  (ROBAXIN ) 750 MG tablet Take 1 tablet (750 mg total) by mouth 2 (two) times daily as needed for muscle spasms. 60 tablet 1   polyethylene glycol powder (GLYCOLAX /MIRALAX ) 17 GM/SCOOP powder Take 17 g (1 capful) and mix in 8 oz of clear liquid and drink by mouth daily. 238 g 0   sildenafil  (VIAGRA ) 100 MG tablet Take 0.5-1 tablets (50-100 mg total) by mouth daily as needed for erectile dysfunction. 10 tablet 11   spironolactone  (ALDACTONE ) 50 MG tablet Take 1 tablet (50 mg total) by mouth daily. 90 tablet 1   gabapentin (NEURONTIN) 300 MG capsule Take 300 mg by mouth 3 (three) times daily. (Patient not taking: Reported on 10/07/2023)     HYDROcodone -acetaminophen  (NORCO/VICODIN) 5-325 MG tablet Take 1-2 tablets by mouth every 6 (six) hours as needed for severe pain (pain score 7-10). (Patient not taking: Reported on 10/07/2023) 12 tablet 0   No current facility-administered medications on file prior to visit.    Allergies  Allergen Reactions   Doxycycline  Other (See Comments)    Possible allergen. Developed Bell's Palsy the day after taking antibiotic.   Penicillins  Swelling and Rash    Social History   Socioeconomic History   Marital status: Single    Spouse name: Not on file   Number of children: 3   Years of education: Not on file   Highest education level: Some college, no degree  Occupational History   Occupation: manual labor  Tobacco Use   Smoking status: Former    Types: Cigars   Smokeless tobacco: Never   Tobacco comments:    Occ black and  milds   Vaping Use   Vaping status: Never Used  Substance and Sexual Activity   Alcohol use: Not Currently   Drug use: Not Currently    Types: Marijuana   Sexual activity: Not on file  Other Topics Concern   Not on file  Social History Narrative   Not on file   Social Drivers of Health   Financial Resource Strain: Medium Risk (04/09/2023)   Overall Financial Resource Strain (CARDIA)    Difficulty of  Paying Living Expenses: Somewhat hard  Food Insecurity: No Food Insecurity (07/25/2023)   Hunger Vital Sign    Worried About Running Out of Food in the Last Year: Never true    Ran Out of Food in the Last Year: Never true  Transportation Needs: No Transportation Needs (05/16/2023)   PRAPARE - Administrator, Civil Service (Medical): No    Lack of Transportation (Non-Medical): No  Physical Activity: Sufficiently Active (04/09/2023)   Exercise Vital Sign    Days of Exercise per Week: 4 days    Minutes of Exercise per Session: 60 min  Stress: No Stress Concern Present (04/09/2023)   [employer] of Occupational Health - Occupational Stress Questionnaire    Feeling of Stress : Only a little  Social Connections: Unknown (04/09/2023)   Social Connection and Isolation Panel    Frequency of Communication with Friends and Family: More than three times a week    Frequency of Social Gatherings with Friends and Family: Twice a week    Attends Religious Services: Never    Database administrator or Organizations: No    Attends Engineer, structural: Not on file    Marital  Status: Patient declined  Intimate Partner Violence: Not At Risk (05/16/2023)   Humiliation, Afraid, Rape, and Kick questionnaire    Fear of Current or Ex-Partner: No    Emotionally Abused: No    Physically Abused: No    Sexually Abused: No    Family History  Problem Relation Age of Onset   Cancer Mother        breast cancer    Past Surgical History:  Procedure Laterality Date   CHOLECYSTECTOMY     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  Gross sensation intact in both upper extremities. Musculoskeletal -right shoulder: Drop arm test positive.  He has good passive range of motion except on elevation above the head and external rotation behind the back.  No tenderness on palpation of the cervical spine.  He has good range of motion of the neck.     Latest Ref Rng & Units 12/10/2023    2:48 PM 05/16/2023    5:18 AM 05/15/2023    4:55 PM  CMP  Glucose 70 - 99 mg/dL 885  818  879   BUN 6 - 20 mg/dL 10  10  8    Creatinine 0.61 - 1.24 mg/dL 9.08  9.03  9.15   Sodium 135 - 145 mmol/L 136  134  136   Potassium 3.5 - 5.1 mmol/L 3.7  3.5  3.9   Chloride 98 - 111 mmol/L 103  103  101   CO2 22 - 32 mmol/L 29  25  26    Calcium 8.9 - 10.3 mg/dL 9.1  8.8  9.4   Total Protein 6.5 - 8.1 g/dL 7.7  7.1  8.6   Total Bilirubin 0.0 - 1.2 mg/dL  0.4  0.7  0.4   Alkaline Phos 38 - 126 U/L 94  84  102   AST 15 - 41 U/L 8  12  15    ALT 0 - 44 U/L 10  16  17     Lipid Panel      Component Value Date/Time   CHOL 129 10/19/2020 1136   TRIG 114 10/19/2020 1136   HDL 39 (L) 10/19/2020 1136   CHOLHDL 3.3 10/19/2020 1136   LDLCALC 69 10/19/2020 1136    CBC    Component Value Date/Time   WBC 7.2 12/10/2023 1448   WBC 6.8 05/17/2023 0453   RBC 5.21 12/10/2023 1448   HGB 14.8 12/10/2023 1448   HGB 17.4 04/05/2022 1010   HCT 42.7 12/10/2023 1448   HCT 51.9 (H) 04/05/2022 1010   PLT 333 12/10/2023 1448   PLT 285 04/05/2022 1010   MCV 82.0 12/10/2023 1448   MCV 86 04/05/2022 1010   MCH 28.4 12/10/2023 1448   MCHC 34.7 12/10/2023 1448   RDW 13.5 12/10/2023 1448   RDW 12.9 04/05/2022 1010   LYMPHSABS 2.6 12/10/2023 1448   LYMPHSABS 2.7 04/05/2022 1010   MONOABS 0.5 12/10/2023 1448   EOSABS 0.1 12/10/2023 1448   EOSABS 0.1 04/05/2022 1010   BASOSABS 0.1 12/10/2023 1448   BASOSABS 0.0 04/05/2022 1010    ASSESSMENT AND PLAN: 1. Acute pain of right shoulder (Primary) Suspect the rotator cuff issue likely tendinitis Will refer to orthopedics for further evaluation and management. Continue topical Biofreeze, ibuprofen  as needed.  - AMB referral to orthopedics  2. Hypertension associated with type 2 diabetes mellitus (HCC) Not at goal.  Encourage patient to be taking all of his blood pressure medications including the carvedilol .   Patient was given the opportunity to ask questions.  Patient verbalized understanding of the plan and was able to repeat key elements of the plan.   This documentation was completed using Paediatric nurse.  Any transcriptional errors are unintentional.  Orders Placed This Encounter  Procedures   AMB referral to orthopedics     Requested Prescriptions    No prescriptions requested or ordered in this encounter    No follow-ups on file.  Barnie Louder, MD, FACP

## 2023-12-25 ENCOUNTER — Telehealth: Admitting: Physician Assistant

## 2023-12-25 DIAGNOSIS — M25511 Pain in right shoulder: Secondary | ICD-10-CM

## 2023-12-25 MED ORDER — DICLOFENAC SODIUM 1 % EX GEL
2.0000 g | Freq: Four times a day (QID) | CUTANEOUS | 0 refills | Status: AC
Start: 2023-12-25 — End: ?

## 2023-12-25 NOTE — Progress Notes (Signed)
 Virtual Visit Consent   [name], you are scheduled for a virtual visit with a [facility] provider today. Just as with appointments in the office, your consent must be obtained to participate. Your consent will be active for this visit and any virtual visit you may have with one of our providers in the next 365 days. If you have a MyChart account, a copy of this consent can be sent to you electronically.  As this is a virtual visit, video technology does not allow for your provider to perform a traditional examination. This may limit your provider's ability to fully assess your condition. If your provider identifies any concerns that need to be evaluated in person or the need to arrange testing (such as labs, EKG, etc.), we will make arrangements to do so. Although advances in technology are sophisticated, we cannot ensure that it will always work on either your end or our end. If the connection with a video visit is poor, the visit may have to be switched to a telephone visit. With either a video or telephone visit, we are not always able to ensure that we have a secure connection.  By engaging in this virtual visit, you consent to the provision of healthcare and authorize for your insurance to be billed (if applicable) for the services provided during this visit. Depending on your insurance coverage, you may receive a charge related to this service.  I need to obtain your verbal consent now. Are you willing to proceed with your visit today? [name] has provided verbal consent on 12/25/2023 for a virtual visit (video or telephone). [name] CHRISTELLA Dickinson, PA-C  Date: 12/25/2023 4:57 PM   Virtual Visit via Video Note   I, [name], connected with  [name]  ([id number], 30-Apr-1975) on 12/25/23 at  4:30 PM EDT by a video-enabled telemedicine application and verified that I am speaking with the correct person using two identifiers.  Location: Patient: Virtual Visit  Location Patient: Home Provider: Virtual Visit Location Provider: Home Office   I discussed the limitations of evaluation and management by telemedicine and the availability of in person appointments. The patient expressed understanding and agreed to proceed.    History of Present Illness: [name] is a 48 y.o. who identifies as a male who was assigned male at birth, and is being seen today for right shoulder pain.  HPI: Shoulder Pain  The pain is present in the right shoulder. This is a new problem. The current episode started 1 to 4 weeks ago (Seen on 12/17/23 by PCP for same issue- felt to be Rotator cuff tendinopathy and was referred to Ortho, has appt 12/31/23). There has been no history of extremity trauma. The problem occurs constantly. The quality of the pain is described as aching. The pain is mild. Associated symptoms include a limited range of motion and numbness. Pertinent negatives include no fever, inability to bear weight, joint locking, joint swelling, stiffness or tingling. Associated symptoms comments: Feels swollen in right pectoral region. The symptoms are aggravated by activity. He has tried acetaminophen , OTC ointments, heat and cold for the symptoms. The treatment provided no relief.     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   Pulmonary embolism on right 09/24/2022   Nasal  cavity mass 09/24/2022   Acute deep vein thrombosis (DVT) of femoral vein of right lower extremity (HCC) 08/21/2022   Neck pain 08/14/2021   Prediabetes 01/28/2021   Vitamin D  deficiency 01/28/2021   Essential hypertension 09/08/2020   Pre-ulcerative corn or callous 02/01/2019   Disorder of left Achilles tendon 02/01/2019   Inguinal hernia of left side without obstruction or gangrene  12/08/2016   Former tobacco use 12/08/2016   Localized swelling, mass and lump, multiple sites 12/08/2016   Subcutaneous nodules 07/09/2016   Periodontitis 05/27/2015   Pain in left wrist 05/27/2015   HTN (hypertension) 05/12/2015    Allergies:  Allergies  Allergen Reactions   Doxycycline  Other (See Comments)    Possible allergen. Developed Bell's Palsy the day after taking antibiotic.   Penicillins Swelling and Rash   Medications:  Current Outpatient Medications:    diclofenac  Sodium (VOLTAREN ) 1 % GEL, Apply 2 g topically 4 (four) times daily., Disp: 150 g, Rfl: 0   Accu-Chek Softclix Lancets lancets, Use as instructed to check blood sugars once a day, Disp: 100 each, Rfl: 12   acetaminophen  (TYLENOL ) 500 MG tablet, Take 500 mg by mouth every 6 (six) hours as needed for moderate pain (pain score 4-6)., Disp: , Rfl:    amLODipine  (NORVASC ) 10 MG tablet, Take 1 tablet (10 mg total) by mouth daily., Disp: 90 tablet, Rfl: 1   apixaban  (ELIQUIS ) 5 MG TABS tablet, Take 1 tablet (5 mg total) by mouth 2 (two) times daily., Disp: 60 tablet, Rfl: 11   Blood Glucose Monitoring Suppl (ACCU-CHEK GUIDE) w/Device KIT, Use to check blood sugars once a day, Disp: 1 kit, Rfl: 0   budesonide  (PULMICORT ) 0.5 MG/2ML nebulizer solution, Mix 4 mLs (1 mg total/2 respules) itno 240 ml of nasal saline irrigation once daily., Disp: 120 mL, Rfl: 11   carvedilol  (COREG ) 25 MG tablet, Take 1 tablet (25 mg total) by mouth 2 (two) times daily with a meal., Disp: 180 tablet, Rfl: 1   gabapentin (NEURONTIN) 300 MG capsule, Take 300 mg by mouth 3 (three) times daily. (Patient not taking: Reported on 10/07/2023), Disp: , Rfl:    glucose blood (ACCU-CHEK GUIDE TEST) test strip, Use as instructed to check blood sugars once a day, Disp: 100 each, Rfl: 12   HYDROcodone -acetaminophen  (NORCO/VICODIN) 5-325 MG tablet, Take 1-2 tablets by mouth every 6 (six) hours as needed for severe pain (pain score 7-10). (Patient not taking:  Reported on 10/07/2023), Disp: 12 tablet, Rfl: 0   losartan  (COZAAR ) 100 MG tablet, Take 1 tablet (100 mg total) by mouth daily., Disp: 90 tablet, Rfl: 1   methimazole  (TAPAZOLE ) 5 MG tablet, Take 0.5 tablets (2.5 mg total) by mouth daily., Disp: , Rfl:    methocarbamol  (ROBAXIN ) 750 MG tablet, Take 1 tablet (750 mg total) by mouth 2 (two) times daily as needed for muscle spasms., Disp: 60 tablet, Rfl: 1   polyethylene glycol powder (GLYCOLAX /MIRALAX ) 17 GM/SCOOP powder, Take 17 g (1 capful) and mix in 8 oz of clear liquid and drink by mouth daily., Disp: 238 g, Rfl: 0   sildenafil  (VIAGRA ) 100 MG tablet, Take 0.5-1 tablets (50-100 mg total) by mouth daily as needed for erectile dysfunction., Disp: 10 tablet, Rfl: 11   spironolactone  (ALDACTONE ) 50 MG tablet, Take 1 tablet (50 mg total) by mouth daily., Disp: 90 tablet, Rfl: 1  Observations/Objective: Patient is well-developed, well-nourished in no acute distress.  Resting comfortably at home.  Head is normocephalic, atraumatic.  No labored breathing.  Speech is clear and coherent with logical content.  Patient is alert and oriented at baseline.    Assessment and Plan: 1. Acute pain of right shoulder (Primary) - diclofenac  Sodium (VOLTAREN ) 1 % GEL; Apply 2 g topically 4 (four) times daily.  Dispense: 150 g; Refill: 0  - Worsening - Continue Tylenol  and Methocarbamol  - Continue heat - Ice after activity - Will add Voltaren  gel as above (Patient can't take oral NSAIDs due to being on Eliquis ) - Avoiding steroids at this time since patient is diabetic (Last A1c was 6.4) - Provided exercises and stretches in AVS - Keep scheduled appt with Ortho on 12/31/23 - Work note provided - Seek in person evaluation if worsening   Follow Up Instructions: I discussed the assessment and treatment plan with the patient. The patient was provided an opportunity to ask questions and all were answered. The patient agreed with the plan and demonstrated an  understanding of the instructions.  A copy of instructions were sent to the patient via MyChart unless otherwise noted below.    The patient was advised to call back or seek an in-person evaluation if the symptoms worsen or if the condition fails to improve as anticipated.    [name] CHRISTELLA Dickinson, PA-C

## 2023-12-25 NOTE — Patient Instructions (Signed)
 [name] Single, thank you for joining [name], PA-C for today's virtual visit.  While this provider is not your primary care provider (PCP), if your PCP is located in our provider database this encounter information will be shared with them immediately following your visit.   A [facility] MyChart account gives you access to today's visit and all your visits, tests, and labs performed at [facility]  click here if you don't have a [facility] MyChart account or go to [email or web address]  Consent: (Patient) [name] Single provided verbal consent for this virtual visit at the beginning of the encounter.  Current Medications:  Current Outpatient Medications:    diclofenac  Sodium (VOLTAREN ) 1 % GEL, Apply 2 g topically 4 (four) times daily., Disp: 150 g, Rfl: 0   Accu-Chek Softclix Lancets lancets, Use as instructed to check blood sugars once a day, Disp: 100 each, Rfl: 12   acetaminophen  (TYLENOL ) 500 MG tablet, Take 500 mg by mouth every 6 (six) hours as needed for moderate pain (pain score 4-6)., Disp: , Rfl:    amLODipine  (NORVASC ) 10 MG tablet, Take 1 tablet (10 mg total) by mouth daily., Disp: 90 tablet, Rfl: 1   apixaban  (ELIQUIS ) 5 MG TABS tablet, Take 1 tablet (5 mg total) by mouth 2 (two) times daily., Disp: 60 tablet, Rfl: 11   Blood Glucose Monitoring Suppl (ACCU-CHEK GUIDE) w/Device KIT, Use to check blood sugars once a day, Disp: 1 kit, Rfl: 0   budesonide  (PULMICORT ) 0.5 MG/2ML nebulizer solution, Mix 4 mLs (1 mg total/2 respules) itno 240 ml of nasal saline irrigation once daily., Disp: 120 mL, Rfl: 11   carvedilol  (COREG ) 25 MG tablet, Take 1 tablet (25 mg total) by mouth 2 (two) times daily with a meal., Disp: 180 tablet, Rfl: 1   gabapentin (NEURONTIN) 300 MG capsule, Take 300 mg by mouth 3 (three) times daily. (Patient not taking: Reported on 10/07/2023), Disp: , Rfl:    glucose blood (ACCU-CHEK GUIDE TEST) test strip, Use as instructed to check  blood sugars once a day, Disp: 100 each, Rfl: 12   HYDROcodone -acetaminophen  (NORCO/VICODIN) 5-325 MG tablet, Take 1-2 tablets by mouth every 6 (six) hours as needed for severe pain (pain score 7-10). (Patient not taking: Reported on 10/07/2023), Disp: 12 tablet, Rfl: 0   losartan  (COZAAR ) 100 MG tablet, Take 1 tablet (100 mg total) by mouth daily., Disp: 90 tablet, Rfl: 1   methimazole  (TAPAZOLE ) 5 MG tablet, Take 0.5 tablets (2.5 mg total) by mouth daily., Disp: , Rfl:    methocarbamol  (ROBAXIN ) 750 MG tablet, Take 1 tablet (750 mg total) by mouth 2 (two) times daily as needed for muscle spasms., Disp: 60 tablet, Rfl: 1   polyethylene glycol powder (GLYCOLAX /MIRALAX ) 17 GM/SCOOP powder, Take 17 g (1 capful) and mix in 8 oz of clear liquid and drink by mouth daily., Disp: 238 g, Rfl: 0   sildenafil  (VIAGRA ) 100 MG tablet, Take 0.5-1 tablets (50-100 mg total) by mouth daily as needed for erectile dysfunction., Disp: 10 tablet, Rfl: 11   spironolactone  (ALDACTONE ) 50 MG tablet, Take 1 tablet (50 mg total) by mouth daily., Disp: 90 tablet, Rfl: 1   Medications ordered in this encounter:  Meds ordered this encounter  Medications   diclofenac  Sodium (VOLTAREN ) 1 % GEL    Sig: Apply 2 g topically 4 (four) times daily.    Dispense:  150 g    Refill:  0    Supervising Provider:   BLAISE, PHILIP  MALVA [[id number]]     *If you need refills on other medications prior to your next appointment, please contact your pharmacy*  Follow-Up: Call back or seek an in-person evaluation if the symptoms worsen or if the condition fails to improve as anticipated.  [facility] [phone]  Other Instructions  Secondary Shoulder Impingement Syndrome Rehab Ask your health care provider which exercises are safe for you. Do exercises exactly as told by your provider and adjust them as told. It is normal to feel mild stretching, pulling, tightness, or discomfort as you do these exercises. Stop right away if  you feel sudden pain or your pain gets worse. Do not begin these exercises until told by your provider. Stretching and range-of-motion exercise This exercise warms up your muscles and joints. It also improves the movement and flexibility of your neck and shoulder. This exercise also helps to relieve pain and stiffness. Cervical side bend  Using good posture, sit on a stable chair or stand up. Without moving your shoulders, slowly tilt your left / right ear toward your left / right shoulder until you feel a stretch in your neck (cervical) muscles on the other side. You should be looking straight ahead. Hold for __________ seconds. Slowly return to the starting position. Repeat the stretch on your left / right side. Repeat __________ times. Complete this exercise __________ times a day. Strengthening exercises These exercises build strength and endurance in your shoulder. Endurance is the ability to use your muscles for a long time, even after they get tired. Scapular protraction, supine, isotonic  Lie on your back on a firm surface (supine position). Hold a __________ weight in your left / right hand. Raise your left / right arm straight into the air so your hand is directly above your shoulder joint. Push the weight into the air so your shoulder (scapula) lifts off the surface that you are lying on. The scapula will push up or forward (protraction). Do not move your head, neck, or back. Hold for __________ seconds. Slowly return to the starting position. Let your muscles relax completely before you repeat this exercise. Repeat __________ times. Complete this exercise __________ times a day. Scapular retraction, isotonic  Sit in a stable chair without armrests or stand up. Secure an exercise band to a stable object in front of you so the band is at shoulder height. Hold one end of the exercise band in each hand. Squeeze your shoulder blades (scapulae) together and move your elbows slightly  behind you (retraction). Do not shrug your shoulders upward while you do this. Hold for __________ seconds. Slowly return to the starting position. Repeat __________ times. Complete this exercise __________ times a day. Shoulder extension with scapular retraction, isotonic  Sit in a stable chair without armrests or stand up. Secure an exercise band to a stable object in front of you so the band is above shoulder height. Hold one end of the exercise band in each hand. Straighten your elbows and lift your hands up to shoulder height. Squeeze your shoulder blades together (scapular retraction) and pull your hands down to the sides of your thighs (shoulder extension). Stop when your hands are straight down by your sides. Do not let your hands go behind your body. Hold for __________ seconds. Slowly return to the starting position. Repeat __________ times. Complete this exercise __________ times a day. Shoulder abduction, isotonic  Sit in a stable chair without armrests or stand up. If told, hold a __________ weight in  your left / right hand. Start with your arms straight down. Turn your left / right hand so your palm faces in, toward your body. Slowly lift your left / right hand out to your side (abduction). Do not lift your hand above shoulder height. Keep your arms straight. Avoid shrugging your shoulder while you do this movement. Keep your shoulder blade tucked down toward the middle of your back. Hold for __________ seconds. Slowly lower your arm, and return to the starting position. Repeat __________ times. Complete this exercise __________ times a day. This information is not intended to replace advice given to you by your health care provider. Make sure you discuss any questions you have with your health care provider. Document Revised: 11/23/2021 Document Reviewed: 11/23/2021 Elsevier Patient Education  2024 Elsevier Inc.   If you have been instructed to have an in-person  evaluation today at a local Urgent Care facility, please use the link below. It will take you to a list of all of our available [facility], including address, phone number and hours of operation. Please do not delay care.  [facility]  If you or a family member do not have a primary care provider, use the link below to schedule a visit and establish care. When you choose a [facility] primary care physician or advanced practice provider, you gain a long-term partner in health. Find a Primary Care Provider  Learn more about [facility] in-office and virtual care options: [facility] - Get Care Now

## 2023-12-31 ENCOUNTER — Other Ambulatory Visit: Payer: Self-pay

## 2023-12-31 ENCOUNTER — Ambulatory Visit: Admitting: Orthopaedic Surgery

## 2023-12-31 DIAGNOSIS — M25511 Pain in right shoulder: Secondary | ICD-10-CM

## 2023-12-31 DIAGNOSIS — G8929 Other chronic pain: Secondary | ICD-10-CM

## 2023-12-31 MED ORDER — METHYLPREDNISOLONE 4 MG PO TBPK: ORAL_TABLET | ORAL | 0 refills | Status: DC

## 2023-12-31 NOTE — Progress Notes (Signed)
 Office Visit Note   Patient: [name]           Date of Birth: Feb 20, 1976           MRN: [id number] Visit Date: 12/31/2023              Requested by: Vicci Barnie NOVAK, MD [address] [place],  [place] [address] PCP: Vicci Barnie NOVAK, MD   Assessment & Plan: Visit Diagnoses:  1. Chronic right shoulder pain     Plan: History of Present Illness [name] is a 48 year old male with diabetes who presents with right shoulder pain. He was referred by Dr. Vicci for evaluation of right shoulder pain.  He has experienced right shoulder pain since September 2025, primarily across the shoulder, worsening with arm elevation and activities such as lifting boxes at work. Topical treatments like BioFreeze have not alleviated the pain. Initially, numbness and tingling radiated down his arm to his hand, but these symptoms have subsided, leaving soreness and pain. The pain is bothersome when sitting or lying on the affected side.  He recalls a past episode of swelling near the pectoral area, which was the only time he observed swelling. The right arm is his dominant arm, and the pain affects his ability to perform tasks at work. Rotating his arm in a specific manner causes discomfort.  He is currently on blood thinners and has well-controlled diabetes.  Physical Exam MUSCULOSKELETAL: Right shoulder exhibits pain with neer impingement, good flexibility, no Hawkins sign, good strength in supraspinatus, and pain with resisted external rotation.  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 Rotator cuff intact. - Prescribed Medrol Dosepak for six days. - Referred to physical therapy for insurance compliance for potential MRI. - Provided work note for rest today. - Consider MRI if symptoms persist beyond six weeks.  Follow-Up Instructions: No follow-ups on file.    Orders:  Orders Placed This Encounter  Procedures   XR Shoulder Right   Ambulatory referral to Physical Therapy   Meds ordered this encounter  Medications   methylPREDNISolone (MEDROL DOSEPAK) 4 MG TBPK tablet    Sig: Take as directed    Dispense:  21 tablet    Refill:  0      Procedures: No procedures performed   Clinical Data: No additional findings.   Subjective: Chief Complaint  Patient presents with   Right Shoulder - Pain    HPI  Review of Systems  Constitutional: Negative.   HENT: Negative.    Eyes: Negative.   Respiratory: Negative.    Cardiovascular: Negative.   Gastrointestinal: Negative.   Endocrine: Negative.   Genitourinary: Negative.   Skin: Negative.   Allergic/Immunologic: Negative.   Neurological: Negative.   Hematological: Negative.   Psychiatric/Behavioral: Negative.    All other systems reviewed and are negative.    Objective: Vital Signs: There were no vitals taken for this visit.  Physical Exam Vitals and nursing note reviewed.  Constitutional:      Appearance: He is well-developed.  HENT:     Head: Normocephalic and atraumatic.  Eyes:     Pupils: Pupils are equal, round, and reactive to light.  Pulmonary:     Effort: Pulmonary effort is normal.  Abdominal:     Palpations: Abdomen is soft.  Musculoskeletal:        General: Normal range of motion.     Cervical  back: Neck supple.  Skin:    General: Skin is warm.  Neurological:     Mental Status: He is alert and oriented to person, place, and time.  Psychiatric:        Behavior: Behavior normal.        Thought Content: Thought content normal.        Judgment: Judgment normal.     Ortho Exam  Specialty Comments:  No specialty comments available.  Imaging: XR Shoulder Right Result Date: 12/31/2023 X-rays of the right shoulder show an large acromial spur resulting in decreased acromiohumeral distance and predisposing to impingement syndrome.    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   Pulmonary embolism on right 09/24/2022   Nasal cavity mass 09/24/2022   Acute deep vein thrombosis (DVT) of femoral vein of right lower extremity (HCC) 08/21/2022   Neck pain 08/14/2021   Prediabetes 01/28/2021   Vitamin D  deficiency 01/28/2021   Essential hypertension 09/08/2020   Pre-ulcerative corn or callous 02/01/2019   Disorder of left Achilles tendon 02/01/2019   Inguinal hernia of left side without obstruction or gangrene 12/08/2016   Former tobacco use 12/08/2016   Localized swelling, mass and lump, multiple sites 12/08/2016   Subcutaneous nodules 07/09/2016   Periodontitis 05/27/2015   Pain in left wrist 05/27/2015   HTN (hypertension) 05/12/2015   Past Medical History:  Diagnosis Date   DVT (deep venous thrombosis) (HCC)    Hypertension 03/12/2001   dx age 13    Pulmonary embolism (HCC)    Wears glasses     Family History  Problem Relation Age of Onset   Cancer Mother        breast cancer    Past Surgical History:  Procedure Laterality Date   CHOLECYSTECTOMY     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

## 2024-01-09 ENCOUNTER — Ambulatory Visit: Admitting: Internal Medicine

## 2024-01-13 ENCOUNTER — Encounter: Payer: Self-pay | Admitting: Radiology

## 2024-01-15 ENCOUNTER — Other Ambulatory Visit: Payer: Self-pay

## 2024-01-15 ENCOUNTER — Ambulatory Visit: Attending: Orthopaedic Surgery

## 2024-01-15 DIAGNOSIS — R293 Abnormal posture: Secondary | ICD-10-CM | POA: Diagnosis not present

## 2024-01-15 DIAGNOSIS — G8929 Other chronic pain: Secondary | ICD-10-CM | POA: Insufficient documentation

## 2024-01-15 DIAGNOSIS — M6281 Muscle weakness (generalized): Secondary | ICD-10-CM | POA: Insufficient documentation

## 2024-01-15 DIAGNOSIS — M25511 Pain in right shoulder: Secondary | ICD-10-CM | POA: Insufficient documentation

## 2024-01-15 NOTE — Therapy (Signed)
 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   Pulmonary embolism on right 09/24/2022   Nasal cavity mass 09/24/2022   Acute deep vein thrombosis (DVT) of femoral vein of right lower extremity (HCC) 08/21/2022   Neck pain 08/14/2021   Prediabetes 01/28/2021   Vitamin D  deficiency 01/28/2021   Essential hypertension 09/08/2020   Pre-ulcerative corn or callous 02/01/2019   Disorder of left Achilles tendon 02/01/2019   Inguinal hernia of left side without obstruction or gangrene 12/08/2016   Former tobacco use 12/08/2016   Localized swelling, mass and lump, multiple sites 12/08/2016   Subcutaneous nodules 07/09/2016   Periodontitis 05/27/2015   Pain in left wrist 05/27/2015   HTN (hypertension) 05/12/2015    PCP: Vicci Barnie NOVAK, MD  REFERRING PROVIDER: Jerri Kay HERO, MD  REFERRING DIAG: [phone] (ICD-10-CM) - Chronic right shoulder pain   Rationale for Evaluation and Treatment: Rehabilitation  THERAPY DIAG:  No diagnosis found.  ONSET DATE: ***  SUBJECTIVE:                                                                                                                                                                                           SUBJECTIVE STATEMENT: ***  PERTINENT HISTORY:  ***  PAIN:   Are you having pain?  Yes: NPRS scale: 0/10 Worst: 10/10 Pain location: R  Pain description: *** Aggravating factors: *** Relieving factors: ***  PRECAUTIONS: {Therapy precautions:24002}  RED FLAGS: {PT Red Flags:29287}   WEIGHT BEARING RESTRICTIONS: {Yes ***/No:24003}  FALLS:  Has patient fallen in last 6 months? No  LIVING ENVIRONMENT: Lives with: {[facility] lives with:25569::lives with their family} Lives in: {Lives in:25570} Stairs: {opstairs:27293} Has following equipment at home: {Assistive devices:23999}  OCCUPATION: ***  PLOF: {PLOF:24004}  PATIENT GOALS: ***  NEXT MD VISIT: ***  OBJECTIVE:  Note: Objective measures were completed at Evaluation unless otherwise noted.  DIAGNOSTIC FINDINGS:  ***  PATIENT SURVEYS:  {rehab  surveys:24030}  COGNITION: Overall cognitive status: {cognition:24006}     SENSATION: {sensation:27233}  MUSCLE LENGTH: Hamstrings: Right *** deg; Left *** deg Debby test: Right *** deg; Left *** deg  POSTURE: {posture:25561}  PALPATION: ***  LUMBAR ROM:   AROM eval  Flexion   Extension   Right lateral flexion   Left lateral flexion   Right rotation   Left rotation    (Blank rows = not tested)  LOWER EXTREMITY ROM:     {AROM/PROM:27142}  Right eval Left eval  Hip flexion    Hip extension    Hip abduction    Hip adduction    Hip internal rotation    Hip external rotation    Knee flexion    Knee extension    Ankle dorsiflexion    Ankle plantarflexion    Ankle inversion    Ankle eversion     (Blank rows = not tested)  LOWER EXTREMITY MMT:    MMT Right eval Left eval  Hip flexion    Hip extension    Hip abduction    Hip adduction    Hip internal rotation    Hip external rotation    Knee flexion    Knee extension    Ankle dorsiflexion    Ankle plantarflexion    Ankle inversion    Ankle eversion     (Blank rows = not tested)  LUMBAR SPECIAL TESTS:  {lumbar special test:25242}  FUNCTIONAL  TESTS:  {Functional tests:24029}  GAIT: Distance walked: *** Assistive device utilized: {Assistive devices:23999} Level of assistance: {Levels of assistance:24026} Comments: ***  TREATMENT: [facility] Adult PT Treatment:                                                DATE: *** Therapeutic Exercise: *** Manual Therapy: *** Neuromuscular re-ed: *** Therapeutic Activity: *** Modalities: *** Self Care: ***  PATIENT EDUCATION:  Education details: eval findings, Quick DASH, HEP, POC Person educated: Patient Education method: Explanation, Demonstration, and Handouts Education comprehension: verbalized understanding and returned demonstration  HOME EXERCISE PROGRAM: Access Code: [email or web address] URL: [email or web address] Date: 01/15/2024 Prepared by: Alm Kingdom  Exercises - Seated Cervical Sidebending Stretch  - 1 x daily - 7 x weekly - 2-3 reps - 30 sec hold - Standing Shoulder Row with Anchored Resistance  - 1 x daily - 7 x weekly - 3 sets - 10 reps - blue band hold - Corner Pec Major Stretch  - 1 x daily - 7 x weekly - 2-3 reps - 30 sec hold - Standing Isometric Shoulder Internal Rotation with Towel Roll at Doorway  - 1 x daily - 7 x weekly - 2-3 sets - 10 reps - 5 sec hold - Standing Isometric Shoulder External Rotation with Doorway and Towel Roll (Mirrored)  - 1 x daily - 7 x weekly - 2-3 sets - 10 reps - 5 sec hold  ASSESSMENT:  CLINICAL IMPRESSION: Patient is a 48 y.o. M who was seen today for physical therapy evaluation and treatment for chronic R shoulder pain. Physical findings are consistent with MD impression as pt demonstrates decrease in RTC and periscapular strength and ***. Quick DASH score shows *** disability in performance of home ADLs and community activities. Pt would benefit from skilled PT services working on ***.   OBJECTIVE IMPAIRMENTS: {opptimpairments:25111}.   ACTIVITY LIMITATIONS: {activitylimitations:27494}  PARTICIPATION LIMITATIONS:  {  participationrestrictions:25113}  PERSONAL FACTORS: {Personal factors:25162} are also affecting patient's functional outcome.   REHAB POTENTIAL: {rehabpotential:25112}  CLINICAL DECISION MAKING: {clinical decision making:25114}  EVALUATION COMPLEXITY: {Evaluation complexity:25115}   GOALS: Goals reviewed with patient? No  SHORT TERM GOALS: Target date: 02/05/2024   Pt will be compliant and knowledgeable with initial HEP for improved comfort and carryover Baseline: initial HEP given  Goal status: INITIAL  2.  Pt will self report *** pain no greater than ***/10 for improved comfort and functional ability Baseline: ***/10 at worst Goal status: {GOALSTATUS:25110}   LONG TERM GOALS: Target date: ***  Pt will decrease Quick DASH disability score to no greater than *** as proxy for functional improvement Baseline: ***  Goal status: INITIAL  2.  Pt will self report *** pain no greater than ***/10 for improved comfort and functional ability Baseline: ***/10 at worst Goal status: {GOALSTATUS:25110}   3.  *** Baseline:  Goal status: INITIAL  4.  *** Baseline:  Goal status: INITIAL  5.  *** Baseline:  Goal status: INITIAL  6.  *** Baseline:  Goal status: INITIAL  PLAN:  PT FREQUENCY: {rehab frequency:25116}  PT DURATION: {rehab duration:25117}  PLANNED INTERVENTIONS: {rehab planned interventions:25118::97110-Therapeutic exercises,97530- Therapeutic [phone]- Neuromuscular re-education,97535- Self Rjmz,02859- Manual therapy,Patient/Family education}  PLAN FOR NEXT SESSION: PIERRETTE Alm JAYSON Johna, PT 01/15/2024, 9:41 AM

## 2024-01-16 ENCOUNTER — Other Ambulatory Visit: Payer: Self-pay

## 2024-01-16 ENCOUNTER — Encounter: Payer: Self-pay | Admitting: Internal Medicine

## 2024-01-17 ENCOUNTER — Other Ambulatory Visit (HOSPITAL_COMMUNITY): Payer: Self-pay

## 2024-01-20 ENCOUNTER — Ambulatory Visit: Admitting: Physical Therapy

## 2024-01-20 ENCOUNTER — Encounter: Payer: Self-pay | Admitting: Physical Therapy

## 2024-01-20 DIAGNOSIS — H6123 Impacted cerumen, bilateral: Secondary | ICD-10-CM | POA: Diagnosis not present

## 2024-01-20 DIAGNOSIS — E042 Nontoxic multinodular goiter: Secondary | ICD-10-CM | POA: Diagnosis not present

## 2024-01-20 DIAGNOSIS — J3801 Paralysis of vocal cords and larynx, unilateral: Secondary | ICD-10-CM | POA: Diagnosis not present

## 2024-01-20 DIAGNOSIS — E0590 Thyrotoxicosis, unspecified without thyrotoxic crisis or storm: Secondary | ICD-10-CM | POA: Diagnosis not present

## 2024-01-20 DIAGNOSIS — C801 Malignant (primary) neoplasm, unspecified: Secondary | ICD-10-CM | POA: Diagnosis not present

## 2024-01-20 DIAGNOSIS — Z9889 Other specified postprocedural states: Secondary | ICD-10-CM | POA: Diagnosis not present

## 2024-01-25 ENCOUNTER — Other Ambulatory Visit (HOSPITAL_COMMUNITY): Payer: Self-pay

## 2024-01-30 ENCOUNTER — Ambulatory Visit

## 2024-01-30 DIAGNOSIS — G8929 Other chronic pain: Secondary | ICD-10-CM | POA: Diagnosis not present

## 2024-01-30 DIAGNOSIS — M6281 Muscle weakness (generalized): Secondary | ICD-10-CM | POA: Diagnosis not present

## 2024-01-30 DIAGNOSIS — R293 Abnormal posture: Secondary | ICD-10-CM

## 2024-01-30 DIAGNOSIS — M25511 Pain in right shoulder: Secondary | ICD-10-CM | POA: Diagnosis not present

## 2024-01-30 NOTE — Therapy (Signed)
 OUTPATIENT PHYSICAL THERAPY TREATMENT   Patient Name: [name] MRN: [id number] DOB:[birth year], 48 y.o., male Today's Date: 01/31/2024  END OF SESSION:  PT End of Session - 01/30/24 1450     Visit Number 2    Number of Visits 17    Date for Recertification  03/12/24    Authorization Type [place] [employer]    PT Start Time 1450   arrived late   PT Stop Time 1528    PT Time Calculation (min) 38 min    Activity Tolerance Patient tolerated treatment well    Behavior During Therapy [facility] for tasks assessed/performed           Past Medical History:  Diagnosis Date   DVT (deep venous thrombosis) (HCC)    Hypertension 03/12/2001   dx age 23    Pulmonary embolism (HCC)    Wears glasses    Past Surgical History:  Procedure Laterality Date   CHOLECYSTECTOMY     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   Pulmonary embolism on right 09/24/2022   Nasal cavity mass 09/24/2022   Acute deep vein thrombosis (DVT) of femoral vein of right lower extremity (HCC) 08/21/2022   Neck pain 08/14/2021   Prediabetes 01/28/2021   Vitamin D  deficiency 01/28/2021   Essential hypertension 09/08/2020   Pre-ulcerative corn or callous 02/01/2019   Disorder of left Achilles tendon 02/01/2019   Inguinal hernia of left side without obstruction or gangrene 12/08/2016   Former tobacco use 12/08/2016   Localized swelling, mass and lump, multiple sites 12/08/2016   Subcutaneous nodules 07/09/2016   Periodontitis 05/27/2015   Pain in left wrist 05/27/2015   HTN (hypertension) 05/12/2015    PCP: Vicci Barnie NOVAK, MD  REFERRING PROVIDER: Jerri Kay HERO, MD  REFERRING DIAG: [phone] (ICD-10-CM) - Chronic right shoulder pain    Rationale for Evaluation and Treatment: Rehabilitation  THERAPY DIAG:  Chronic right shoulder pain  Muscle weakness (generalized)  Abnormal posture  ONSET DATE: Chronic >3 months   SUBJECTIVE:                                                                                                                                                                                           SUBJECTIVE STATEMENT: Pt presents to PT with reports of 9/10 R shoulder pain.   EVAL: Pt presents to PT with reports of subacute  on chronic R shoulder pain. Also c/o R N/T down to hand although this is not constant. Is R hand dominant and this provides difficulty to home ADLs and work duties at [employer]. No trauma or MOI, has noted R pectoral swelling twice but otherwise no other inflammatory symptoms noted.   PERTINENT HISTORY:  HTN, DVT, Hx of cancer  PAIN:  Are you having pain?  Yes: NPRS scale: 0/10 Worst: 10/10 Pain location: R anterior shoulder Pain description: sharp, sore Aggravating factors: OH reaching, lifting, work Relieving factors: rest  PRECAUTIONS: None  RED FLAGS: None   WEIGHT BEARING RESTRICTIONS: No  FALLS:  Has patient fallen in last 6 months? No  LIVING ENVIRONMENT: Lives with: lives alone Lives in: House/apartment  OCCUPATION: [employer]  PLOF: Independent  PATIENT GOALS: decrease shoulder pain, improve comfort with work  OBJECTIVE:  Note: Objective measures were completed at Evaluation unless otherwise noted.  DIAGNOSTIC FINDINGS:  See imaging   PATIENT SURVEYS:  Quick Dash:  QUICK DASH  Please rate your ability do the following activities in the last week by selecting the number below the appropriate response.   Activities Rating  Open a tight or new jar.  2 = Mild difficulty  Do heavy household chores (e.g., wash walls, floors). 1 = No difficulty   Carry a shopping bag or briefcase 1 = No difficulty   Wash your back. 3 = Moderate difficulty  Use a knife  to cut food. 2 = Mild difficulty  Recreational activities in which you take some force or impact through your arm, shoulder or hand (e.g., golf, hammering, tennis, etc.). 2 = Mild difficulty  During the past week, to what extent has your arm, shoulder or hand problem interfered with your normal social activities with family, friends, neighbors or groups?  4 = Quite a bit  During the past week, were you limited in your work or other regular daily activities as a result of your arm, shoulder or hand problem? 4 = Very limited  Rate the severity of the following symptoms in the last week: Arm, Shoulder, or hand pain. 4 = Severe  Rate the severity of the following symptoms in the last week: Tingling (pins and needles) in your arm, shoulder or hand. 4 = Severe  During the past week, how much difficulty have you had sleeping because of the pain in your arm, shoulder or hand?  3 = Moderate difficulty   (A QuickDASH score may not be calculated if there is greater than 1 missing item.)  Quick Dash Disability/Symptom Score: 41%  Minimally Clinically Important Difference (MCID): 15-20 points  Flavio, F. et al. (2013). Minimally clinically important difference of the disabilities of the arm, shoulder, and hand outcome measures (DASH) and its shortened version (Quick DASH). Journal of Orthopaedic & Sports Physical Therapy, 44(1), 30-39)   COGNITION: Overall cognitive status: Within functional limits for tasks assessed     SENSATION: Light touch: Impaired - R UE   POSTURE: rounded shoulders and forward head  PALPATION: TTP to R anterior shoulder, R infraspinatus  UPPER EXTREMITY ROM:  Active ROM Right eval Left eval  Shoulder flexion 125 WNL  Shoulder extension    Shoulder abduction 80 WNL  Shoulder adduction    Shoulder extension    Shoulder internal rotation L2 T12  Shoulder external rotation 40 WNL  Elbow flexion    Elbow extension    Wrist flexion    Wrist extension    Wrist  ulnar deviation    Wrist  radial deviation    Wrist pronation    Wrist supination     (Blank rows = not tested)  UPPER EXTREMITY MMT:  MMT Right eval Left eval  Shoulder flexion 3+ 5  Shoulder extension    Shoulder abduction 3 5  Shoulder adduction    Shoulder extension    Shoulder internal rotation 3+ 5  Shoulder external rotation 3 p! 5  Middle trapezius    Lower trapezius    Elbow flexion    Elbow extension    Wrist flexion    Wrist extension    Wrist ulnar deviation    Wrist radial deviation    Wrist pronation    Wrist supination    Grip strength 65# 65#   (Blank rows = not tested)  TREATMENT: [facility] Adult PT Treatment:                                                DATE: 01/30/2024 UBE lvl 1.0 x 3 min for functional activity tolerance Supine dow flex 2x10 Supine horizontal abd 2x10 YTB R shoulder IR/ER x 10 - 5 hold Row 2x10 GTB UE ranger flexion x 10 41 Seated cane ER AAROM 2x10 Seated row 2x10 20#  [facility] Adult PT Treatment:                                                DATE: 01/16/2024 Therapeutic Exercise: R upper trap stretch x 30  Row x 10 blue band Corner pec stretch x 30 R shoulder IR/ER x 5 - 5 hold  PATIENT EDUCATION:  Education details: eval findings, Quick DASH, HEP, POC Person educated: Patient Education method: Explanation, Demonstration, and Handouts Education comprehension: verbalized understanding and returned demonstration  HOME EXERCISE PROGRAM: Access Code: [email or web address] URL: [email or web address] Date: 01/15/2024 Prepared by: Alm Kingdom  Exercises - Seated Cervical Sidebending Stretch  - 1 x daily - 7 x weekly - 2-3 reps - 30 sec hold - Standing Shoulder Row with Anchored Resistance  - 1 x daily - 7 x weekly - 3 sets - 10 reps - blue band hold - Corner Pec Major Stretch  - 1 x daily - 7 x weekly - 2-3 reps - 30 sec hold - Standing Isometric Shoulder Internal Rotation with Towel Roll at Doorway  - 1 x daily - 7 x  weekly - 2-3 sets - 10 reps - 5 sec hold - Standing Isometric Shoulder External Rotation with Doorway and Towel Roll (Mirrored)  - 1 x daily - 7 x weekly - 2-3 sets - 10 reps - 5 sec hold  ASSESSMENT:  CLINICAL IMPRESSION: Pt tolerated treatment fair but was limited by increased R shoulder pain. Today we focused on light strengthening of shoulder dynamic stabilizers. He continues to benefit from skilled PT services, will continue to progress as able per POC.   EVAL: Patient is a 48 y.o. M who was seen today for physical therapy evaluation and treatment for chronic R shoulder pain. Physical findings are consistent with MD impression as pt demonstrates decrease in RTC and periscapular strength and postural deficits. Quick DASH score shows severe disability in performance of home ADLs and community activities. Pt would benefit from skilled PT services working on periscapular and  RTC strength in order to decrease pain and improve shoulder function.   OBJECTIVE IMPAIRMENTS: decreased activity tolerance, decreased endurance, decreased mobility, decreased ROM, decreased strength, postural dysfunction, and pain.   ACTIVITY LIMITATIONS: carrying, lifting, dressing, reach over head, and locomotion level  PARTICIPATION LIMITATIONS: meal prep, cleaning, shopping, community activity, occupation, yard work, and school  PERSONAL FACTORS: Time since onset of injury/illness/exacerbation and 1-2 comorbidities: HTN, DVT, Hx of cancer are also affecting patient's functional outcome.   REHAB POTENTIAL: Good  CLINICAL DECISION MAKING: Evolving/moderate complexity  EVALUATION COMPLEXITY: Moderate   GOALS: Goals reviewed with patient? No  SHORT TERM GOALS: Target date: 02/05/2024   Pt will be compliant and knowledgeable with initial HEP for improved comfort and carryover Baseline: initial HEP given  Goal status: INITIAL  2.  Pt will self report R shoulder pain no greater than 7/10 for improved comfort and  functional ability Baseline: 10/10 at worst Goal status: INITIAL   LONG TERM GOALS: Target date: 03/12/2024   Pt will decrease Quick DASH disability score to no greater than 28% as proxy for functional improvement Baseline: 40%  Goal status: INITIAL  2.  Pt will self report R shoulder pain no greater than 5/10 for improved comfort and functional ability Baseline: 10/10 at worst Goal status: INITIAL   3.  Pt will improve R shoulder flexion to no less than 140 degrees for improved comfort and function Baseline: see chart Goal status: INITIAL  4.  Pt will improve R shoulder MMT to no less than 4/5 for all tested motions for improved comfort and function Baseline: see chart Goal status: INITIAL   PLAN:  PT FREQUENCY: 1-2x/week  PT DURATION: 8 weeks  PLANNED INTERVENTIONS: 97164- PT Re-evaluation, 97110-Therapeutic exercises, 97530- Therapeutic activity, [id number]- Neuromuscular re-education, 97535- Self Care, 02859- Manual therapy, G0283- Electrical stimulation (unattended), [id number]- Electrical stimulation (manual), 97016- Vasopneumatic device, 20560 (1-2 muscles), 20561 (3+ muscles)- Dry Needling, and Patient/Family education  PLAN FOR NEXT SESSION: assess HEP response, RTC and periscapular strengthening, anterior stretching    Alm JAYSON Kingdom, PT 01/31/2024, 9:25 AM

## 2024-01-31 ENCOUNTER — Other Ambulatory Visit: Payer: Self-pay

## 2024-01-31 ENCOUNTER — Other Ambulatory Visit (HOSPITAL_COMMUNITY): Payer: Self-pay

## 2024-02-03 ENCOUNTER — Ambulatory Visit

## 2024-02-03 ENCOUNTER — Other Ambulatory Visit: Payer: Self-pay

## 2024-02-03 NOTE — Therapy (Incomplete)
 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   Pulmonary embolism on right 09/24/2022   Nasal cavity mass 09/24/2022   Acute deep vein thrombosis (DVT) of femoral vein of right lower extremity (HCC) 08/21/2022   Neck pain 08/14/2021   Prediabetes 01/28/2021   Vitamin D  deficiency 01/28/2021   Essential hypertension 09/08/2020   Pre-ulcerative corn or callous 02/01/2019   Disorder of left Achilles tendon 02/01/2019   Inguinal hernia of left side without obstruction or gangrene 12/08/2016   Former tobacco use 12/08/2016   Localized swelling, mass and lump, multiple sites 12/08/2016   Subcutaneous nodules 07/09/2016   Periodontitis 05/27/2015   Pain in left wrist 05/27/2015   HTN (hypertension) 05/12/2015    PCP: Vicci Barnie NOVAK, MD  REFERRING PROVIDER: Jerri Kay HERO, MD  REFERRING DIAG: [phone] (ICD-10-CM) - Chronic right shoulder pain   Rationale for Evaluation and Treatment: Rehabilitation  THERAPY DIAG:  No diagnosis found.  ONSET DATE: Chronic >3 months   SUBJECTIVE:                                                                                                                                                                                           SUBJECTIVE STATEMENT: ***  EVAL: Pt presents to PT with  reports of subacute on chronic R shoulder pain. Also c/o R N/T down to hand although this is not constant. Is R hand dominant and this provides difficulty to home ADLs and work duties at [employer]. No trauma or MOI, has noted R pectoral swelling twice but otherwise no other inflammatory symptoms noted.   PERTINENT HISTORY:  HTN, DVT, Hx of cancer  PAIN:  Are you having pain?  Yes: NPRS scale: 0/10 Worst: 10/10 Pain location: R anterior shoulder Pain description: sharp, sore Aggravating factors: OH reaching, lifting, work Relieving factors: rest  PRECAUTIONS: None  RED FLAGS: None   WEIGHT BEARING RESTRICTIONS: No  FALLS:  Has patient fallen in last 6 months? No  LIVING ENVIRONMENT: Lives with: lives alone Lives in: House/apartment  OCCUPATION: [employer]  PLOF: Independent  PATIENT GOALS: decrease shoulder pain, improve comfort with work  OBJECTIVE:  Note: Objective measures were completed at Evaluation unless otherwise noted.  DIAGNOSTIC FINDINGS:  See imaging   PATIENT SURVEYS:  Quick Dash:  QUICK DASH  Please rate your ability do the following activities in the last week by selecting the number below the appropriate response.   Activities Rating  Open a tight or new jar.  2 = Mild difficulty  Do heavy household chores (e.g., wash walls, floors). 1 = No difficulty   Carry a shopping bag or briefcase 1 = No difficulty   Wash your back. 3 = Moderate difficulty  Use a knife to cut food. 2 = Mild difficulty  Recreational activities in which you take some force or impact through your arm, shoulder or hand (e.g., golf, hammering, tennis, etc.). 2 = Mild difficulty  During the past week, to what extent has your arm, shoulder or hand problem interfered with your normal social activities with family, friends, neighbors or groups?  4 = Quite a bit  During the past week, were you limited in your work or other regular daily activities as a result of your arm, shoulder or hand  problem? 4 = Very limited  Rate the severity of the following symptoms in the last week: Arm, Shoulder, or hand pain. 4 = Severe  Rate the severity of the following symptoms in the last week: Tingling (pins and needles) in your arm, shoulder or hand. 4 = Severe  During the past week, how much difficulty have you had sleeping because of the pain in your arm, shoulder or hand?  3 = Moderate difficulty   (A QuickDASH score may not be calculated if there is greater than 1 missing item.)  Quick Dash Disability/Symptom Score: 41%  Minimally Clinically Important Difference (MCID): 15-20 points  Flavio, F. et al. (2013). Minimally clinically important difference of the disabilities of the arm, shoulder, and hand outcome measures (DASH) and its shortened version (Quick DASH). Journal of Orthopaedic & Sports Physical Therapy, 44(1), 30-39)   COGNITION: Overall cognitive status: Within functional limits for tasks assessed     SENSATION: Light touch: Impaired - R UE   POSTURE: rounded shoulders and forward head  PALPATION: TTP to R anterior shoulder, R infraspinatus  UPPER EXTREMITY ROM:  Active ROM Right eval Left eval  Shoulder flexion 125 WNL  Shoulder extension    Shoulder abduction 80 WNL  Shoulder adduction    Shoulder extension    Shoulder internal rotation L2 T12  Shoulder external rotation 40 WNL  Elbow flexion    Elbow extension    Wrist flexion    Wrist extension    Wrist ulnar deviation    Wrist radial deviation    Wrist pronation    Wrist supination     (Blank rows = not tested)  UPPER EXTREMITY MMT:  MMT Right eval Left eval  Shoulder flexion 3+ 5  Shoulder extension    Shoulder abduction 3 5  Shoulder adduction    Shoulder extension    Shoulder internal rotation 3+ 5  Shoulder external rotation 3 p! 5  Middle trapezius    Lower trapezius    Elbow flexion    Elbow extension    Wrist flexion    Wrist extension    Wrist ulnar deviation    Wrist  radial  deviation    Wrist pronation    Wrist supination    Grip strength 65# 65#   (Blank rows = not tested)  TREATMENT: [facility] Adult PT Treatment:                                                DATE: 02/03/2024 UBE lvl 1.0 x 3 min for functional activity tolerance Supine dow flex 2x10 Supine horizontal abd 2x10 YTB R shoulder IR/ER x 10 - 5 hold Row 2x10 GTB UE ranger flexion x 10 41 Seated cane ER AAROM 2x10 Seated row 2x10 20#  [facility] Adult PT Treatment:                                                DATE: 01/30/2024 UBE lvl 1.0 x 3 min for functional activity tolerance Supine dow flex 2x10 Supine horizontal abd 2x10 YTB R shoulder IR/ER x 10 - 5 hold Row 2x10 GTB UE ranger flexion x 10 41 Seated cane ER AAROM 2x10 Seated row 2x10 20#  [facility] Adult PT Treatment:                                                DATE: 01/16/2024 Therapeutic Exercise: R upper trap stretch x 30  Row x 10 blue band Corner pec stretch x 30 R shoulder IR/ER x 5 - 5 hold  PATIENT EDUCATION:  Education details: eval findings, Quick DASH, HEP, POC Person educated: Patient Education method: Explanation, Demonstration, and Handouts Education comprehension: verbalized understanding and returned demonstration  HOME EXERCISE PROGRAM: Access Code: [email or web address] URL: [email or web address] Date: 01/15/2024 Prepared by: Alm Kingdom  Exercises - Seated Cervical Sidebending Stretch  - 1 x daily - 7 x weekly - 2-3 reps - 30 sec hold - Standing Shoulder Row with Anchored Resistance  - 1 x daily - 7 x weekly - 3 sets - 10 reps - blue band hold - Corner Pec Major Stretch  - 1 x daily - 7 x weekly - 2-3 reps - 30 sec hold - Standing Isometric Shoulder Internal Rotation with Towel Roll at Doorway  - 1 x daily - 7 x weekly - 2-3 sets - 10 reps - 5 sec hold - Standing Isometric Shoulder External Rotation with Doorway and Towel Roll (Mirrored)  - 1 x daily - 7 x weekly - 2-3 sets - 10 reps - 5 sec  hold  ASSESSMENT:  CLINICAL IMPRESSION: ***  EVAL: Patient is a 48 y.o. M who was seen today for physical therapy evaluation and treatment for chronic R shoulder pain. Physical findings are consistent with MD impression as pt demonstrates decrease in RTC and periscapular strength and postural deficits. Quick DASH score shows severe disability in performance of home ADLs and community activities. Pt would benefit from skilled PT services working on periscapular and RTC strength in order to decrease pain and improve shoulder function.   OBJECTIVE IMPAIRMENTS: decreased activity tolerance, decreased endurance, decreased mobility, decreased ROM, decreased strength, postural dysfunction, and pain.   ACTIVITY LIMITATIONS: carrying, lifting, dressing, reach over head, and locomotion level  PARTICIPATION LIMITATIONS: meal  prep, cleaning, shopping, community activity, occupation, yard work, and school  PERSONAL FACTORS: Time since onset of injury/illness/exacerbation and 1-2 comorbidities: HTN, DVT, Hx of cancer are also affecting patient's functional outcome.   REHAB POTENTIAL: Good  CLINICAL DECISION MAKING: Evolving/moderate complexity  EVALUATION COMPLEXITY: Moderate   GOALS: Goals reviewed with patient? No  SHORT TERM GOALS: Target date: 02/05/2024   Pt will be compliant and knowledgeable with initial HEP for improved comfort and carryover Baseline: initial HEP given  Goal status: INITIAL  2.  Pt will self report R shoulder pain no greater than 7/10 for improved comfort and functional ability Baseline: 10/10 at worst Goal status: INITIAL   LONG TERM GOALS: Target date: 03/12/2024   Pt will decrease Quick DASH disability score to no greater than 28% as proxy for functional improvement Baseline: 40%  Goal status: INITIAL  2.  Pt will self report R shoulder pain no greater than 5/10 for improved comfort and functional ability Baseline: 10/10 at worst Goal status: INITIAL   3.   Pt will improve R shoulder flexion to no less than 140 degrees for improved comfort and function Baseline: see chart Goal status: INITIAL  4.  Pt will improve R shoulder MMT to no less than 4/5 for all tested motions for improved comfort and function Baseline: see chart Goal status: INITIAL   PLAN:  PT FREQUENCY: 1-2x/week  PT DURATION: 8 weeks  PLANNED INTERVENTIONS: 97164- PT Re-evaluation, 97110-Therapeutic exercises, 97530- Therapeutic activity, [id number]- Neuromuscular re-education, 97535- Self Care, 02859- Manual therapy, G0283- Electrical stimulation (unattended), [id number]- Electrical stimulation (manual), 97016- Vasopneumatic device, 20560 (1-2 muscles), 20561 (3+ muscles)- Dry Needling, and Patient/Family education  PLAN FOR NEXT SESSION: assess HEP response, RTC and periscapular strengthening, anterior stretching    Alm JAYSON Kingdom, PT 02/03/2024, 11:08 AM

## 2024-02-04 ENCOUNTER — Other Ambulatory Visit: Payer: Self-pay

## 2024-02-05 ENCOUNTER — Other Ambulatory Visit (HOSPITAL_COMMUNITY): Payer: Self-pay

## 2024-02-10 ENCOUNTER — Ambulatory Visit: Admitting: Internal Medicine

## 2024-02-13 ENCOUNTER — Ambulatory Visit: Attending: Orthopaedic Surgery

## 2024-02-13 NOTE — Therapy (Deleted)
 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   Pulmonary embolism on right 09/24/2022   Nasal cavity mass 09/24/2022   Acute deep vein thrombosis (DVT) of femoral vein of right lower extremity (HCC) 08/21/2022   Neck pain 08/14/2021   Prediabetes 01/28/2021   Vitamin D  deficiency 01/28/2021   Essential hypertension 09/08/2020   Pre-ulcerative corn or callous 02/01/2019   Disorder of left Achilles tendon 02/01/2019   Inguinal hernia of left side without obstruction or gangrene 12/08/2016   Former tobacco use 12/08/2016   Localized swelling, mass and lump, multiple sites 12/08/2016   Subcutaneous nodules 07/09/2016   Periodontitis 05/27/2015   Pain in left wrist 05/27/2015   HTN (hypertension) 05/12/2015    PCP: Vicci Barnie NOVAK, MD  REFERRING PROVIDER: Jerri Kay HERO, MD  REFERRING DIAG: [phone] (ICD-10-CM) - Chronic right shoulder pain   Rationale for Evaluation and Treatment: Rehabilitation  THERAPY DIAG:  No diagnosis found.  ONSET DATE: Chronic >3 months   SUBJECTIVE:                                                                                                                                                                                           SUBJECTIVE STATEMENT: ***  EVAL: Pt presents to PT with  reports of subacute on chronic R shoulder pain. Also c/o R N/T down to hand although this is not constant. Is R hand dominant and this provides difficulty to home ADLs and work duties at [employer]. No trauma or MOI, has noted R pectoral swelling twice but otherwise no other inflammatory symptoms noted.   PERTINENT HISTORY:  HTN, DVT, Hx of cancer  PAIN:  Are you having pain?  Yes: NPRS scale: 0/10 Worst: 10/10 Pain location: R anterior shoulder Pain description: sharp, sore Aggravating factors: OH reaching, lifting, work Relieving factors: rest  PRECAUTIONS: None  RED FLAGS: None   WEIGHT BEARING RESTRICTIONS: No  FALLS:  Has patient fallen in last 6 months? No  LIVING ENVIRONMENT: Lives with: lives alone Lives in: House/apartment  OCCUPATION: [employer]  PLOF: Independent  PATIENT GOALS: decrease shoulder pain, improve comfort with work  OBJECTIVE:  Note: Objective measures were completed at Evaluation unless otherwise noted.  DIAGNOSTIC FINDINGS:  See imaging   PATIENT SURVEYS:  Quick Dash:  QUICK DASH  Please rate your ability do the following activities in the last week by selecting the number below the appropriate response.   Activities Rating  Open a tight or new jar.  2 = Mild difficulty  Do heavy household chores (e.g., wash walls, floors). 1 = No difficulty   Carry a shopping bag or briefcase 1 = No difficulty   Wash your back. 3 = Moderate difficulty  Use a knife to cut food. 2 = Mild difficulty  Recreational activities in which you take some force or impact through your arm, shoulder or hand (e.g., golf, hammering, tennis, etc.). 2 = Mild difficulty  During the past week, to what extent has your arm, shoulder or hand problem interfered with your normal social activities with family, friends, neighbors or groups?  4 = Quite a bit  During the past week, were you limited in your work or other regular daily activities as a result of your arm, shoulder or hand  problem? 4 = Very limited  Rate the severity of the following symptoms in the last week: Arm, Shoulder, or hand pain. 4 = Severe  Rate the severity of the following symptoms in the last week: Tingling (pins and needles) in your arm, shoulder or hand. 4 = Severe  During the past week, how much difficulty have you had sleeping because of the pain in your arm, shoulder or hand?  3 = Moderate difficulty   (A QuickDASH score may not be calculated if there is greater than 1 missing item.)  Quick Dash Disability/Symptom Score: 41%  Minimally Clinically Important Difference (MCID): 15-20 points  Flavio, F. et al. (2013). Minimally clinically important difference of the disabilities of the arm, shoulder, and hand outcome measures (DASH) and its shortened version (Quick DASH). Journal of Orthopaedic & Sports Physical Therapy, 44(1), 30-39)   COGNITION: Overall cognitive status: Within functional limits for tasks assessed     SENSATION: Light touch: Impaired - R UE   POSTURE: rounded shoulders and forward head  PALPATION: TTP to R anterior shoulder, R infraspinatus  UPPER EXTREMITY ROM:  Active ROM Right eval Left eval  Shoulder flexion 125 WNL  Shoulder extension    Shoulder abduction 80 WNL  Shoulder adduction    Shoulder extension    Shoulder internal rotation L2 T12  Shoulder external rotation 40 WNL  Elbow flexion    Elbow extension    Wrist flexion    Wrist extension    Wrist ulnar deviation    Wrist radial deviation    Wrist pronation    Wrist supination     (Blank rows = not tested)  UPPER EXTREMITY MMT:  MMT Right eval Left eval  Shoulder flexion 3+ 5  Shoulder extension    Shoulder abduction 3 5  Shoulder adduction    Shoulder extension    Shoulder internal rotation 3+ 5  Shoulder external rotation 3 p! 5  Middle trapezius    Lower trapezius    Elbow flexion    Elbow extension    Wrist flexion    Wrist extension    Wrist ulnar deviation    Wrist  radial  deviation    Wrist pronation    Wrist supination    Grip strength 65# 65#   (Blank rows = not tested)  TREATMENT: [facility] Adult PT Treatment:                                                DATE: 02/03/2024 UBE lvl 1.0 x 3 min for functional activity tolerance Supine dow flex 2x10 Supine horizontal abd 2x10 YTB R shoulder IR/ER x 10 - 5 hold Row 2x10 GTB UE ranger flexion x 10 41 Seated cane ER AAROM 2x10 Seated row 2x10 20#  [facility] Adult PT Treatment:                                                DATE: 01/30/2024 UBE lvl 1.0 x 3 min for functional activity tolerance Supine dow flex 2x10 Supine horizontal abd 2x10 YTB R shoulder IR/ER x 10 - 5 hold Row 2x10 GTB UE ranger flexion x 10 41 Seated cane ER AAROM 2x10 Seated row 2x10 20#  [facility] Adult PT Treatment:                                                DATE: 01/16/2024 Therapeutic Exercise: R upper trap stretch x 30  Row x 10 blue band Corner pec stretch x 30 R shoulder IR/ER x 5 - 5 hold  PATIENT EDUCATION:  Education details: eval findings, Quick DASH, HEP, POC Person educated: Patient Education method: Explanation, Demonstration, and Handouts Education comprehension: verbalized understanding and returned demonstration  HOME EXERCISE PROGRAM: Access Code: [email or web address] URL: [email or web address] Date: 01/15/2024 Prepared by: Alm Kingdom  Exercises - Seated Cervical Sidebending Stretch  - 1 x daily - 7 x weekly - 2-3 reps - 30 sec hold - Standing Shoulder Row with Anchored Resistance  - 1 x daily - 7 x weekly - 3 sets - 10 reps - blue band hold - Corner Pec Major Stretch  - 1 x daily - 7 x weekly - 2-3 reps - 30 sec hold - Standing Isometric Shoulder Internal Rotation with Towel Roll at Doorway  - 1 x daily - 7 x weekly - 2-3 sets - 10 reps - 5 sec hold - Standing Isometric Shoulder External Rotation with Doorway and Towel Roll (Mirrored)  - 1 x daily - 7 x weekly - 2-3 sets - 10 reps - 5 sec  hold  ASSESSMENT:  CLINICAL IMPRESSION: ***  EVAL: Patient is a 48 y.o. M who was seen today for physical therapy evaluation and treatment for chronic R shoulder pain. Physical findings are consistent with MD impression as pt demonstrates decrease in RTC and periscapular strength and postural deficits. Quick DASH score shows severe disability in performance of home ADLs and community activities. Pt would benefit from skilled PT services working on periscapular and RTC strength in order to decrease pain and improve shoulder function.   OBJECTIVE IMPAIRMENTS: decreased activity tolerance, decreased endurance, decreased mobility, decreased ROM, decreased strength, postural dysfunction, and pain.   ACTIVITY LIMITATIONS: carrying, lifting, dressing, reach over head, and locomotion level  PARTICIPATION LIMITATIONS: meal  prep, cleaning, shopping, community activity, occupation, yard work, and school  PERSONAL FACTORS: Time since onset of injury/illness/exacerbation and 1-2 comorbidities: HTN, DVT, Hx of cancer are also affecting patient's functional outcome.   REHAB POTENTIAL: Good  CLINICAL DECISION MAKING: Evolving/moderate complexity  EVALUATION COMPLEXITY: Moderate   GOALS: Goals reviewed with patient? No  SHORT TERM GOALS: Target date: 02/05/2024   Pt will be compliant and knowledgeable with initial HEP for improved comfort and carryover Baseline: initial HEP given  Goal status: INITIAL  2.  Pt will self report R shoulder pain no greater than 7/10 for improved comfort and functional ability Baseline: 10/10 at worst Goal status: INITIAL   LONG TERM GOALS: Target date: 03/12/2024   Pt will decrease Quick DASH disability score to no greater than 28% as proxy for functional improvement Baseline: 40%  Goal status: INITIAL  2.  Pt will self report R shoulder pain no greater than 5/10 for improved comfort and functional ability Baseline: 10/10 at worst Goal status: INITIAL   3.   Pt will improve R shoulder flexion to no less than 140 degrees for improved comfort and function Baseline: see chart Goal status: INITIAL  4.  Pt will improve R shoulder MMT to no less than 4/5 for all tested motions for improved comfort and function Baseline: see chart Goal status: INITIAL   PLAN:  PT FREQUENCY: 1-2x/week  PT DURATION: 8 weeks  PLANNED INTERVENTIONS: 97164- PT Re-evaluation, 97110-Therapeutic exercises, 97530- Therapeutic activity, 97112- Neuromuscular re-education, 97535- Self Care, [id number]- Manual therapy, [id number]- Electrical stimulation (unattended), [id number]- Electrical stimulation (manual), 97016- Vasopneumatic device, 20560 (1-2 muscles), 20561 (3+ muscles)- Dry Needling, and Patient/Family education  PLAN FOR NEXT SESSION: assess HEP response, RTC and periscapular strengthening, anterior stretching    Reyes CHRISTELLA Kohut, PT 02/13/2024, 9:16 AM

## 2024-02-14 ENCOUNTER — Telehealth: Payer: Self-pay

## 2024-02-14 NOTE — Telephone Encounter (Signed)
 TC due to missed visit.  Spoke directly to patient who stated he was stuck in traffic.  Reminded of next appointment time and attendance policy.

## 2024-02-21 ENCOUNTER — Other Ambulatory Visit (HOSPITAL_COMMUNITY): Payer: Self-pay

## 2024-02-21 MED ORDER — MUPIROCIN 2 % EX OINT
1.0000 | TOPICAL_OINTMENT | Freq: Two times a day (BID) | CUTANEOUS | 1 refills | Status: AC
  Filled 2024-02-21: qty 22, 30d supply, fill #0

## 2024-02-24 ENCOUNTER — Ambulatory Visit

## 2024-02-24 ENCOUNTER — Other Ambulatory Visit: Payer: Self-pay

## 2024-02-25 ENCOUNTER — Other Ambulatory Visit: Payer: Self-pay

## 2024-02-25 ENCOUNTER — Ambulatory Visit: Attending: Internal Medicine | Admitting: Internal Medicine

## 2024-02-25 VITALS — BP 130/83 | HR 79 | Temp 98.1°F | Ht 74.0 in | Wt 215.0 lb

## 2024-02-25 DIAGNOSIS — Z85819 Personal history of malignant neoplasm of unspecified site of lip, oral cavity, and pharynx: Secondary | ICD-10-CM | POA: Diagnosis not present

## 2024-02-25 DIAGNOSIS — J3489 Other specified disorders of nose and nasal sinuses: Secondary | ICD-10-CM | POA: Diagnosis not present

## 2024-02-25 DIAGNOSIS — M542 Cervicalgia: Secondary | ICD-10-CM

## 2024-02-25 DIAGNOSIS — E1169 Type 2 diabetes mellitus with other specified complication: Secondary | ICD-10-CM

## 2024-02-25 DIAGNOSIS — E0590 Thyrotoxicosis, unspecified without thyrotoxic crisis or storm: Secondary | ICD-10-CM

## 2024-02-25 DIAGNOSIS — E02 Subclinical iodine-deficiency hypothyroidism: Secondary | ICD-10-CM | POA: Diagnosis not present

## 2024-02-25 DIAGNOSIS — Z1211 Encounter for screening for malignant neoplasm of colon: Secondary | ICD-10-CM

## 2024-02-25 DIAGNOSIS — I10 Essential (primary) hypertension: Secondary | ICD-10-CM

## 2024-02-25 DIAGNOSIS — Z86711 Personal history of pulmonary embolism: Secondary | ICD-10-CM

## 2024-02-25 DIAGNOSIS — I152 Hypertension secondary to endocrine disorders: Secondary | ICD-10-CM

## 2024-02-25 DIAGNOSIS — E1159 Type 2 diabetes mellitus with other circulatory complications: Secondary | ICD-10-CM

## 2024-02-25 DIAGNOSIS — E042 Nontoxic multinodular goiter: Secondary | ICD-10-CM

## 2024-02-25 LAB — GLUCOSE, POCT (MANUAL RESULT ENTRY): POC Glucose: 119 mg/dL — AB (ref 70–99)

## 2024-02-25 LAB — POCT GLYCOSYLATED HEMOGLOBIN (HGB A1C): HbA1c, POC (controlled diabetic range): 6.9 % (ref 0.0–7.0)

## 2024-02-25 NOTE — Patient Instructions (Signed)
°  VISIT SUMMARY: Today, you had a follow-up visit to discuss your hypertension, thyroid  issues, and other health concerns. We reviewed your current medications, recent symptoms, and made some adjustments to your treatment plan.  YOUR PLAN: -HYPERTENSION ASSOCIATED WITH TYPE 2 DIABETES MELLITUS: Your blood pressure is slightly above the target range, partly due to inconsistent intake of carvedilol . Hypertension is high blood pressure, which can lead to serious health problems if not managed. Continue taking amlodipine , losartan , and spironolactone  as prescribed. Try to take carvedilol  with food to avoid queasiness and do not double the dose if you miss one. Keep monitoring your blood pressure at home.  -TYPE 2 DIABETES MELLITUS: Your A1c level has increased to 6.9%, indicating that your blood sugar control has worsened slightly. Type 2 diabetes is a condition where your body does not use insulin properly, leading to high blood sugar levels. Continue with your dietary changes, especially reducing soda intake, and try to increase your physical activity.  -SUBCLINICAL HYPERTHYROIDISM WITH THYROID  NODULE: You have an enlarged thyroid  with nodules, causing a throbbing sensation and difficulty swallowing. Subclinical hyperthyroidism is a mild form of overactive thyroid . We have ordered a repeat thyroid  ultrasound and thyroid  function tests. Continue taking methimazole  5 mg, half a tablet daily.  -NASAL PAIN AND SWELLING, POST-NASOPHARYNGEAL CANCER: You have throbbing pain and swelling on the right side of your nose, which has improved with mupirocin  ointment and nasal rinses. Continue using the mupirocin  ointment and nasal rinses. A CT scan of your facial bones, sinuses, and neck is scheduled for January 7th, 2026.  -HISTORY OF PULMONARY EMBOLISM AND DEEP VEIN THROMBOSIS, ON ANTICOAGULATION: You are continuing on Eliquis  indefinitely to prevent blood clots. Pulmonary embolism and deep vein thrombosis are  conditions where blood clots form in the lungs and legs, respectively.  -GENERAL HEALTH MAINTENANCE: We discussed colon cancer screening options. You prefer a colonoscopy over Cologuard, so a referral for a colonoscopy has been submitted. Cologuard was discussed as an alternative option.  INSTRUCTIONS: Please follow up with the thyroid  ultrasound and thyroid  function tests as ordered. Continue monitoring your blood pressure at home and keep track of your blood sugar levels. Attend your scheduled CT scan on January 7th, 2026. A referral for a colonoscopy has been submitted, so please schedule that appointment as well.                      Contains text generated by [employer]bridge.                                 Contains text generated by Abridge.

## 2024-02-25 NOTE — Progress Notes (Unsigned)
 Patient ID: [name], male    DOB: 11-Nov-1975  MRN: [id number]  CC: Hypertension (HTN f/u. /R nasal pain X1 week - surgeon prescribed mupirocin  ointment/Throbbing of right side of neck, feels movement X1 week/No to flu vax. Requesting colonoscopy referral. )   Subjective: [name] is a 48 y.o. male who presents for chronic ds management. His concerns today include:  Hx of poorly controlled  HTN, thyroid  nodules (bx RT mid and lower 2024/endocrine [facility]. 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   Discussed the use of AI scribe software for clinical note transcription with the patient, who gave verbal consent to proceed.  History of Present Illness [name] is a 48 year old male with hypertension and hx of nasopharyngeal CA  who presents for a follow-up visit.  HTN: He has not been consistently taking his carvedilol  due to queasiness when taken without food in the mornings and he does not eat breakfast, resulting in once daily instead of the prescribed twice daily dosing. He continues to take amlodipine , losartan , and spironolactone  regularly. Home blood pressure readings have been around 127/85, and today's reading was 130/83.  He saw his ENT specialist Dr. Gretta at [facility] 01/20/24 who performed a laryngoscopy and told him that everything looked good, with no concerns raised at that time. He has been experiencing RT sided nasal pain on the surgical side x1 wk, with swelling. Endorses small amount dried blood from this side when he blows his nose. He contacted his ENT, who prescribed mupirocin  ointment, which he started using two days ago. He reports improvement but still feels some pain when pressing on the nose. Has CT of sinuses/chest/abd/pelvis scheduled for 03/18/2024 at [place] [facility] for continued postsurgical surveillance.   He has  noted an intermittent throbbing sensation on the right side of his neck anteriorly where FNB was previously done to eval 2 thyroid  nodules. Pathology were benign. This sensation started about a week ago. He is still taking half of a methimazole  5 mg tablet daily for thyroid  management. He noticed an unevenness in his neck with RT side looking larger than LT. last thyroid  ultrasound was done in June of last year and confirmed multinodular goiter.  The 2 nodules that were biopsied were on the right thyroid  gland.SABRA   He is on Eliquis  for a history of pulmonary embolism and right lower extremity DVT. No recent blood in stools, but he had a small amount of blood in urine after a cystoscopy. He has reduced his use of muscle relaxants, which he thought might be related to the blood in urine.  DM: His A1c has increased slightly from 6.4 to 6.9. He reports dietary changes, including reducing soda intake to two to three mini colas a day and increasing water consumption. He is working more, which involves a lot of walking, but has noticed a weight increase from 207 to 215 pounds.    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   Pulmonary embolism on right 09/24/2022   Nasal cavity mass 09/24/2022   Acute deep vein thrombosis (DVT) of femoral vein of right lower extremity (HCC) 08/21/2022   Neck pain 08/14/2021  Prediabetes 01/28/2021   Vitamin D  deficiency 01/28/2021   Essential hypertension 09/08/2020   Pre-ulcerative corn or callous 02/01/2019   Disorder of left Achilles tendon 02/01/2019   Inguinal hernia of left side without obstruction or gangrene 12/08/2016   Former tobacco use 12/08/2016   Localized swelling, mass and lump, multiple sites 12/08/2016   Subcutaneous nodules 07/09/2016   Periodontitis  05/27/2015   Pain in left wrist 05/27/2015   HTN (hypertension) 05/12/2015     Medications Ordered Prior to Encounter[1]  Allergies[2]  Social History   Socioeconomic History   Marital status: Single    Spouse name: Not on file   Number of children: 3   Years of education: Not on file   Highest education level: Some college, no degree  Occupational History   Occupation: manual labor  Tobacco Use   Smoking status: Former    Types: Cigars   Smokeless tobacco: Never   Tobacco comments:    Occ black and  milds   Vaping Use   Vaping status: Never Used  Substance and Sexual Activity   Alcohol use: Not Currently   Drug use: Not Currently    Types: Marijuana   Sexual activity: Not on file  Other Topics Concern   Not on file  Social History Narrative   Not on file   Social Drivers of Health   Tobacco Use: Medium Risk (01/30/2024)   Patient History    Smoking Tobacco Use: Former    Smokeless Tobacco Use: Never    Passive Exposure: Not on file  Financial Resource Strain: Medium Risk (04/09/2023)   Overall Financial Resource Strain (CARDIA)    Difficulty of Paying Living Expenses: Somewhat hard  Food Insecurity: No Food Insecurity (07/25/2023)   Hunger Vital Sign    Worried About Running Out of Food in the Last Year: Never true    Ran Out of Food in the Last Year: Never true  Transportation Needs: No Transportation Needs (05/16/2023)   PRAPARE - Administrator, Civil Service (Medical): No    Lack of Transportation (Non-Medical): No  Physical Activity: Sufficiently Active (04/09/2023)   Exercise Vital Sign    Days of Exercise per Week: 4 days    Minutes of Exercise per Session: 60 min  Stress: No Stress Concern Present (04/09/2023)   [employer] of Occupational Health - Occupational Stress Questionnaire    Feeling of Stress : Only a little  Social Connections: Unknown (04/09/2023)   Social Connection and Isolation Panel    Frequency of Communication with  Friends and Family: More than three times a week    Frequency of Social Gatherings with Friends and Family: Twice a week    Attends Religious Services: Never    Database Administrator or Organizations: No    Attends Engineer, Structural: Not on file    Marital Status: Patient declined  Intimate Partner Violence: Not At Risk (05/16/2023)   Humiliation, Afraid, Rape, and Kick questionnaire    Fear of Current or Ex-Partner: No    Emotionally Abused: No    Physically Abused: No    Sexually Abused: No  Depression (PHQ2-9): Low Risk (10/07/2023)   Depression (PHQ2-9)    PHQ-2 Score: 1  Alcohol Screen: Low Risk (05/31/2022)   Alcohol Screen    Last Alcohol Screening Score (AUDIT): 2  Housing: Low Risk (05/16/2023)   Housing Stability Vital Sign    Unable to Pay for Housing in the Last Year: No  Number of Times Moved in the Last Year: 0    Homeless in the Last Year: No  Utilities: Not At Risk (05/16/2023)   AHC Utilities    Threatened with loss of utilities: No  Health Literacy: Not on file    Family History  Problem Relation Age of Onset   Cancer Mother        breast cancer    Past Surgical History:  Procedure Laterality Date   CHOLECYSTECTOMY     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    ROS: Review of Systems Negative except as stated above  PHYSICAL EXAM: BP 130/83 (BP Location: Left Arm, Patient Position: Sitting, Cuff Size: Normal)   Pulse 79   Temp 98.1 F (36.7 C) (Oral)   Ht 6' 2 (1.88 m)   Wt 215 lb (97.5 kg)   SpO2 98%   BMI 27.60 kg/m   Wt Readings from Last 3 Encounters:  02/25/24 215 lb (97.5 kg)  12/17/23 207 lb (93.9 kg)  12/12/23 206 lb 8 oz (93.7 kg)    Physical Exam General appearance - alert, well appearing, middle age AAM and in no distress Mental status - normal mood, behavior, speech, dress, motor activity, and thought processes Nose -tiny amount of dried blood noted in the right nostrils.  No abnormal masses  seen.  No erythema noted on the  skin of the nose Mouth -tonsils are large without exudates or erythema Neck -no cervical lymphadenopathy appreciated.  Thyroid  gland is enlarged on the right side.  No tenderness on palpation over the thyroid  gland however Chest - clear to auscultation, no wheezes, rales or rhonchi, symmetric air entry Heart - normal rate, regular rhythm, normal S1, S2, no murmurs, rubs, clicks or gallops Extremities - peripheral pulses normal, no pedal edema, no clubbing or cyanosis  Results for orders placed or performed in visit on 02/25/24  POCT glucose (manual entry)   Collection Time: 02/25/24  3:33 PM  Result Value Ref Range   POC Glucose 119 (A) 70 - 99 mg/dl  POCT glycosylated hemoglobin (Hb A1C)   Collection Time: 02/25/24  3:39 PM  Result Value Ref Range   Hemoglobin A1C     HbA1c POC (<> result, manual entry)     HbA1c, POC (prediabetic range)     HbA1c, POC (controlled diabetic range) 6.9 0.0 - 7.0 %  TSH+T4F+T3Free   Collection Time: 02/25/24  4:43 PM  Result Value Ref Range   TSH 1.030 0.450 - 4.500 uIU/mL   T3, Free 3.6 2.0 - 4.4 pg/mL   Free T4 0.90 0.82 - 1.77 ng/dL  Lipid panel   Collection Time: 02/25/24  4:43 PM  Result Value Ref Range   Cholesterol, Total 123 100 - 199 mg/dL   Triglycerides 878 0 - 149 mg/dL   HDL 38 (L) >60 mg/dL   VLDL Cholesterol Cal 22 5 - 40 mg/dL   LDL Chol Calc (NIH) 63 0 - 99 mg/dL   Chol/HDL Ratio 3.2 0.0 - 5.0 ratio       Latest Ref Rng & Units 12/10/2023    2:48 PM 05/16/2023    5:18 AM 05/15/2023    4:55 PM  CMP  Glucose 70 - 99 mg/dL 885  818  879   BUN 6 - 20 mg/dL 10  10  8    Creatinine 0.61 - 1.24 mg/dL 9.08  9.03  9.15   Sodium 135 - 145 mmol/L 136  134  136   Potassium  3.5 - 5.1 mmol/L 3.7  3.5  3.9   Chloride 98 - 111 mmol/L 103  103  101   CO2 22 - 32 mmol/L 29  25  26    Calcium 8.9 - 10.3 mg/dL 9.1  8.8  9.4   Total Protein 6.5 - 8.1 g/dL 7.7  7.1  8.6   Total Bilirubin 0.0 - 1.2 mg/dL 0.4  0.7   0.4   Alkaline Phos 38 - 126 U/L 94  84  102   AST 15 - 41 U/L 8  12  15    ALT 0 - 44 U/L 10  16  17     Lipid Panel     Component Value Date/Time   CHOL 123 02/25/2024 1643   TRIG 121 02/25/2024 1643   HDL 38 (L) 02/25/2024 1643   CHOLHDL 3.2 02/25/2024 1643   LDLCALC 63 02/25/2024 1643    CBC    Component Value Date/Time   WBC 7.2 12/10/2023 1448   WBC 6.8 05/17/2023 0453   RBC 5.21 12/10/2023 1448   HGB 14.8 12/10/2023 1448   HGB 17.4 04/05/2022 1010   HCT 42.7 12/10/2023 1448   HCT 51.9 (H) 04/05/2022 1010   PLT 333 12/10/2023 1448   PLT 285 04/05/2022 1010   MCV 82.0 12/10/2023 1448   MCV 86 04/05/2022 1010   MCH 28.4 12/10/2023 1448   MCHC 34.7 12/10/2023 1448   RDW 13.5 12/10/2023 1448   RDW 12.9 04/05/2022 1010   LYMPHSABS 2.6 12/10/2023 1448   LYMPHSABS 2.7 04/05/2022 1010   MONOABS 0.5 12/10/2023 1448   EOSABS 0.1 12/10/2023 1448   EOSABS 0.1 04/05/2022 1010   BASOSABS 0.1 12/10/2023 1448   BASOSABS 0.0 04/05/2022 1010    ASSESSMENT AND PLAN: 1. Type 2 diabetes mellitus with other specified complication, without long-term current use of insulin (HCC) (Primary) A1C has increased but still at goal and diet controlled Continue to encourage healthy eating habits and try to move as much as he can.  - POCT glucose (manual entry) - POCT glycosylated hemoglobin (Hb A1C) - Lipid panel  2. Hypertension associated with type 2 diabetes mellitus (HCC) Close to goal Continue amlodipine , losartan , and spironolactone  regularly. Encourage to try to take the Coreg  twice a day as much as possible as intended    3. Nasal pain No signs of overlaying skin infection noted but concerning that it is on the side where CA was removed. Has upcoming CT scan for surveillance Continue the mupirocin  ointment as prescribed by his ENT specialist.  Follow-up if any worsening prior to scheduled CT.  4. Subclinical hyperthyroidism Patient remains on low-dose methimazole .  Will check  thyroid  hormone levels today. - TSH+T4F+T3Free  5. History of pulmonary embolism He had seen the oncologist in follow-up in September.  Plan is for lifelong anticoagulation.  6. History of nasopharyngeal cancer Followed by ENT and oncology at [facility].  He has some upcoming images/CT next month  7. Multinodular goiter Will get updated US  of thyroid  to assess for any changes - US  THYROID ; Future  8. Neck pain on right side Doubt thyroiditis bases on exam findings but will check thyroid  hormone levels and recheck thyroid  US .  9. Screening for colon cancer Discussed colon CA screening methods. He was considering c-scope but then decided on cologuard. No fhx of colon CA. - Cologuard  Work excuse given for today.   Patient was given the opportunity to ask questions.  Patient verbalized understanding of the plan and was  able to repeat key elements of the plan.   This documentation was completed using Paediatric nurse.  Any transcriptional errors are unintentional.  Orders Placed This Encounter  Procedures   US  THYROID    TSH+T4F+T3Free   Cologuard   Lipid panel   POCT glucose (manual entry)   POCT glycosylated hemoglobin (Hb A1C)     Requested Prescriptions    No prescriptions requested or ordered in this encounter    Return in about 4 months (around 06/25/2024).  Barnie Louder, MD, FACP     [1]  Current Outpatient Medications on File Prior to Visit  Medication Sig Dispense Refill   Accu-Chek Softclix Lancets lancets Use as instructed to check blood sugars once a day 100 each 12   acetaminophen  (TYLENOL ) 500 MG tablet Take 500 mg by mouth every 6 (six) hours as needed for moderate pain (pain score 4-6).     amLODipine  (NORVASC ) 10 MG tablet Take 1 tablet (10 mg total) by mouth daily. 90 tablet 1   apixaban  (ELIQUIS ) 5 MG TABS tablet Take 1 tablet (5 mg total) by mouth 2 (two) times daily. 60 tablet 11   Blood Glucose Monitoring Suppl  (ACCU-CHEK GUIDE) w/Device KIT Use to check blood sugars once a day 1 kit 0   budesonide  (PULMICORT ) 0.5 MG/2ML nebulizer solution Mix 4 mLs (1 mg total/2 respules) itno 240 ml of nasal saline irrigation once daily. 120 mL 11   carvedilol  (COREG ) 25 MG tablet Take 1 tablet (25 mg total) by mouth 2 (two) times daily with a meal. 180 tablet 1   diclofenac  Sodium (VOLTAREN ) 1 % GEL Apply 2 g topically 4 (four) times daily. 150 g 0   glucose blood (ACCU-CHEK GUIDE TEST) test strip Use as instructed to check blood sugars once a day 100 each 12   losartan  (COZAAR ) 100 MG tablet Take 1 tablet (100 mg total) by mouth daily. 90 tablet 1   methimazole  (TAPAZOLE ) 5 MG tablet Take 0.5 tablets (2.5 mg total) by mouth daily.     methocarbamol  (ROBAXIN ) 750 MG tablet Take 1 tablet (750 mg total) by mouth 2 (two) times daily as needed for muscle spasms. 60 tablet 1   mupirocin  ointment (BACTROBAN ) 2 % Apply pea-sized amount with finger to inner surface of each nostril and squeeze nasal tip twice daily x 10 days 22 g 1   sildenafil  (VIAGRA ) 100 MG tablet Take 0.5-1 tablets (50-100 mg total) by mouth daily as needed for erectile dysfunction. 10 tablet 11   spironolactone  (ALDACTONE ) 50 MG tablet Take 1 tablet (50 mg total) by mouth daily. 90 tablet 1   gabapentin (NEURONTIN) 300 MG capsule Take 300 mg by mouth 3 (three) times daily. (Patient not taking: Reported on 10/07/2023)     No current facility-administered medications on file prior to visit.  [2]  Allergies Allergen Reactions   Doxycycline  Other (See Comments)    Possible allergen. Developed Bell's Palsy the day after taking antibiotic.   Penicillins Swelling and Rash

## 2024-02-26 ENCOUNTER — Ambulatory Visit: Payer: Self-pay | Admitting: Internal Medicine

## 2024-02-26 LAB — TSH+T4F+T3FREE
Free T4: 0.9 ng/dL (ref 0.82–1.77)
T3, Free: 3.6 pg/mL (ref 2.0–4.4)
TSH: 1.03 u[IU]/mL (ref 0.450–4.500)

## 2024-02-26 LAB — LIPID PANEL
Chol/HDL Ratio: 3.2 ratio (ref 0.0–5.0)
Cholesterol, Total: 123 mg/dL (ref 100–199)
HDL: 38 mg/dL — ABNORMAL LOW (ref >39)
LDL Chol Calc (NIH): 63 mg/dL (ref 0–99)
Triglycerides: 121 mg/dL (ref 0–149)
VLDL Cholesterol Cal: 22 mg/dL (ref 5–40)

## 2024-03-02 NOTE — Therapy (Deleted)
 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   Pulmonary embolism on right 09/24/2022   Nasal cavity mass 09/24/2022   Acute deep vein thrombosis (DVT) of femoral vein of right lower extremity (HCC) 08/21/2022   Neck pain 08/14/2021   Prediabetes 01/28/2021   Vitamin D  deficiency 01/28/2021   Essential hypertension 09/08/2020   Pre-ulcerative corn or callous 02/01/2019   Disorder of left Achilles tendon 02/01/2019   Inguinal hernia of left side without obstruction or gangrene 12/08/2016   Former tobacco use 12/08/2016   Localized swelling, mass and lump, multiple sites 12/08/2016   Subcutaneous nodules 07/09/2016   Periodontitis 05/27/2015   Pain in left wrist 05/27/2015   HTN (hypertension) 05/12/2015    PCP: Vicci Barnie NOVAK, MD  REFERRING PROVIDER: Jerri Kay HERO, MD  REFERRING DIAG: [phone] (ICD-10-CM) - Chronic right shoulder pain   Rationale for Evaluation and Treatment: Rehabilitation  THERAPY DIAG:  No diagnosis found.  ONSET DATE: Chronic >3 months   SUBJECTIVE:                                                                                                                                                                                           SUBJECTIVE STATEMENT: ***  EVAL: Pt presents to PT with  reports of subacute on chronic R shoulder pain. Also c/o R N/T down to hand although this is not constant. Is R hand dominant and this provides difficulty to home ADLs and work duties at [employer]. No trauma or MOI, has noted R pectoral swelling twice but otherwise no other inflammatory symptoms noted.   PERTINENT HISTORY:  HTN, DVT, Hx of cancer  PAIN:  Are you having pain?  Yes: NPRS scale: 0/10 Worst: 10/10 Pain location: R anterior shoulder Pain description: sharp, sore Aggravating factors: OH reaching, lifting, work Relieving factors: rest  PRECAUTIONS: None  RED FLAGS: None   WEIGHT BEARING RESTRICTIONS: No  FALLS:  Has patient fallen in last 6 months? No  LIVING ENVIRONMENT: Lives with: lives alone Lives in: House/apartment  OCCUPATION: [employer]  PLOF: Independent  PATIENT GOALS: decrease shoulder pain, improve comfort with work  OBJECTIVE:  Note: Objective measures were completed at Evaluation unless otherwise noted.  DIAGNOSTIC FINDINGS:  See imaging   PATIENT SURVEYS:  Quick Dash:  QUICK DASH  Please rate your ability do the following activities in the last week by selecting the number below the appropriate response.   Activities Rating  Open a tight or new jar.  2 = Mild difficulty  Do heavy household chores (e.g., wash walls, floors). 1 = No difficulty   Carry a shopping bag or briefcase 1 = No difficulty   Wash your back. 3 = Moderate difficulty  Use a knife to cut food. 2 = Mild difficulty  Recreational activities in which you take some force or impact through your arm, shoulder or hand (e.g., golf, hammering, tennis, etc.). 2 = Mild difficulty  During the past week, to what extent has your arm, shoulder or hand problem interfered with your normal social activities with family, friends, neighbors or groups?  4 = Quite a bit  During the past week, were you limited in your work or other regular daily activities as a result of your arm, shoulder or hand  problem? 4 = Very limited  Rate the severity of the following symptoms in the last week: Arm, Shoulder, or hand pain. 4 = Severe  Rate the severity of the following symptoms in the last week: Tingling (pins and needles) in your arm, shoulder or hand. 4 = Severe  During the past week, how much difficulty have you had sleeping because of the pain in your arm, shoulder or hand?  3 = Moderate difficulty   (A QuickDASH score may not be calculated if there is greater than 1 missing item.)  Quick Dash Disability/Symptom Score: 41%  Minimally Clinically Important Difference (MCID): 15-20 points  Flavio, F. et al. (2013). Minimally clinically important difference of the disabilities of the arm, shoulder, and hand outcome measures (DASH) and its shortened version (Quick DASH). Journal of Orthopaedic & Sports Physical Therapy, 44(1), 30-39)   COGNITION: Overall cognitive status: Within functional limits for tasks assessed     SENSATION: Light touch: Impaired - R UE   POSTURE: rounded shoulders and forward head  PALPATION: TTP to R anterior shoulder, R infraspinatus  UPPER EXTREMITY ROM:  Active ROM Right eval Left eval  Shoulder flexion 125 WNL  Shoulder extension    Shoulder abduction 80 WNL  Shoulder adduction    Shoulder extension    Shoulder internal rotation L2 T12  Shoulder external rotation 40 WNL  Elbow flexion    Elbow extension    Wrist flexion    Wrist extension    Wrist ulnar deviation    Wrist radial deviation    Wrist pronation    Wrist supination     (Blank rows = not tested)  UPPER EXTREMITY MMT:  MMT Right eval Left eval  Shoulder flexion 3+ 5  Shoulder extension    Shoulder abduction 3 5  Shoulder adduction    Shoulder extension    Shoulder internal rotation 3+ 5  Shoulder external rotation 3 p! 5  Middle trapezius    Lower trapezius    Elbow flexion    Elbow extension    Wrist flexion    Wrist extension    Wrist ulnar deviation    Wrist  radial  deviation    Wrist pronation    Wrist supination    Grip strength 65# 65#   (Blank rows = not tested)  TREATMENT: [facility] Adult PT Treatment:                                                DATE: 02/03/2024 UBE lvl 1.0 x 3 min for functional activity tolerance Supine dow flex 2x10 Supine horizontal abd 2x10 YTB R shoulder IR/ER x 10 - 5 hold Row 2x10 GTB UE ranger flexion x 10 41 Seated cane ER AAROM 2x10 Seated row 2x10 20#  [facility] Adult PT Treatment:                                                DATE: 01/30/2024 UBE lvl 1.0 x 3 min for functional activity tolerance Supine dow flex 2x10 Supine horizontal abd 2x10 YTB R shoulder IR/ER x 10 - 5 hold Row 2x10 GTB UE ranger flexion x 10 41 Seated cane ER AAROM 2x10 Seated row 2x10 20#  [facility] Adult PT Treatment:                                                DATE: 01/16/2024 Therapeutic Exercise: R upper trap stretch x 30  Row x 10 blue band Corner pec stretch x 30 R shoulder IR/ER x 5 - 5 hold  PATIENT EDUCATION:  Education details: eval findings, Quick DASH, HEP, POC Person educated: Patient Education method: Explanation, Demonstration, and Handouts Education comprehension: verbalized understanding and returned demonstration  HOME EXERCISE PROGRAM: Access Code: [email or web address] URL: [email or web address] Date: 01/15/2024 Prepared by: Alm Kingdom  Exercises - Seated Cervical Sidebending Stretch  - 1 x daily - 7 x weekly - 2-3 reps - 30 sec hold - Standing Shoulder Row with Anchored Resistance  - 1 x daily - 7 x weekly - 3 sets - 10 reps - blue band hold - Corner Pec Major Stretch  - 1 x daily - 7 x weekly - 2-3 reps - 30 sec hold - Standing Isometric Shoulder Internal Rotation with Towel Roll at Doorway  - 1 x daily - 7 x weekly - 2-3 sets - 10 reps - 5 sec hold - Standing Isometric Shoulder External Rotation with Doorway and Towel Roll (Mirrored)  - 1 x daily - 7 x weekly - 2-3 sets - 10 reps - 5 sec  hold  ASSESSMENT:  CLINICAL IMPRESSION: ***  EVAL: Patient is a 48 y.o. M who was seen today for physical therapy evaluation and treatment for chronic R shoulder pain. Physical findings are consistent with MD impression as pt demonstrates decrease in RTC and periscapular strength and postural deficits. Quick DASH score shows severe disability in performance of home ADLs and community activities. Pt would benefit from skilled PT services working on periscapular and RTC strength in order to decrease pain and improve shoulder function.   OBJECTIVE IMPAIRMENTS: decreased activity tolerance, decreased endurance, decreased mobility, decreased ROM, decreased strength, postural dysfunction, and pain.   ACTIVITY LIMITATIONS: carrying, lifting, dressing, reach over head, and locomotion level  PARTICIPATION LIMITATIONS: meal  prep, cleaning, shopping, community activity, occupation, yard work, and school  PERSONAL FACTORS: Time since onset of injury/illness/exacerbation and 1-2 comorbidities: HTN, DVT, Hx of cancer are also affecting patient's functional outcome.   REHAB POTENTIAL: Good  CLINICAL DECISION MAKING: Evolving/moderate complexity  EVALUATION COMPLEXITY: Moderate   GOALS: Goals reviewed with patient? No  SHORT TERM GOALS: Target date: 02/05/2024   Pt will be compliant and knowledgeable with initial HEP for improved comfort and carryover Baseline: initial HEP given  Goal status: INITIAL  2.  Pt will self report R shoulder pain no greater than 7/10 for improved comfort and functional ability Baseline: 10/10 at worst Goal status: INITIAL   LONG TERM GOALS: Target date: 03/12/2024   Pt will decrease Quick DASH disability score to no greater than 28% as proxy for functional improvement Baseline: 40%  Goal status: INITIAL  2.  Pt will self report R shoulder pain no greater than 5/10 for improved comfort and functional ability Baseline: 10/10 at worst Goal status: INITIAL   3.   Pt will improve R shoulder flexion to no less than 140 degrees for improved comfort and function Baseline: see chart Goal status: INITIAL  4.  Pt will improve R shoulder MMT to no less than 4/5 for all tested motions for improved comfort and function Baseline: see chart Goal status: INITIAL   PLAN:  PT FREQUENCY: 1-2x/week  PT DURATION: 8 weeks  PLANNED INTERVENTIONS: 97164- PT Re-evaluation, 97110-Therapeutic exercises, 97530- Therapeutic activity, 97112- Neuromuscular re-education, 97535- Self Care, 02859- Manual therapy, G0283- Electrical stimulation (unattended), [id number]- Electrical stimulation (manual), 97016- Vasopneumatic device, 20560 (1-2 muscles), 20561 (3+ muscles)- Dry Needling, and Patient/Family education  PLAN FOR NEXT SESSION: assess HEP response, RTC and periscapular strengthening, anterior stretching    Reyes CHRISTELLA Kohut, PT 03/02/2024, 1:04 PM  "

## 2024-03-04 ENCOUNTER — Ambulatory Visit

## 2024-03-09 ENCOUNTER — Other Ambulatory Visit

## 2024-03-13 ENCOUNTER — Telehealth

## 2024-03-15 NOTE — Therapy (Deleted)
 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   Pulmonary embolism on right 09/24/2022   Nasal cavity mass 09/24/2022   Acute deep vein thrombosis (DVT) of femoral vein of right lower extremity (HCC) 08/21/2022   Neck pain 08/14/2021   Prediabetes 01/28/2021   Vitamin D  deficiency 01/28/2021   Essential hypertension 09/08/2020   Pre-ulcerative corn or callous 02/01/2019   Disorder of left Achilles tendon 02/01/2019   Inguinal hernia of left side without obstruction or gangrene 12/08/2016   Former tobacco use 12/08/2016   Localized swelling, mass and lump, multiple sites 12/08/2016   Subcutaneous nodules 07/09/2016   Periodontitis 05/27/2015   Pain in left wrist 05/27/2015   HTN (hypertension) 05/12/2015    PCP: Vicci Barnie NOVAK, MD  REFERRING PROVIDER: Jerri Kay HERO, MD  REFERRING DIAG: [phone] (ICD-10-CM) - Chronic right shoulder pain   Rationale for Evaluation and Treatment: Rehabilitation  THERAPY DIAG:  No diagnosis found.  ONSET DATE: Chronic >3 months   SUBJECTIVE:                                                                                                                                                                                           SUBJECTIVE STATEMENT: ***  EVAL: Pt presents to PT with  reports of subacute on chronic R shoulder pain. Also c/o R N/T down to hand although this is not constant. Is R hand dominant and this provides difficulty to home ADLs and work duties at [employer]. No trauma or MOI, has noted R pectoral swelling twice but otherwise no other inflammatory symptoms noted.   PERTINENT HISTORY:  HTN, DVT, Hx of cancer  PAIN:  Are you having pain?  Yes: NPRS scale: 0/10 Worst: 10/10 Pain location: R anterior shoulder Pain description: sharp, sore Aggravating factors: OH reaching, lifting, work Relieving factors: rest  PRECAUTIONS: None  RED FLAGS: None   WEIGHT BEARING RESTRICTIONS: No  FALLS:  Has patient fallen in last 6 months? No  LIVING ENVIRONMENT: Lives with: lives alone Lives in: House/apartment  OCCUPATION: [employer]  PLOF: Independent  PATIENT GOALS: decrease shoulder pain, improve comfort with work  OBJECTIVE:  Note: Objective measures were completed at Evaluation unless otherwise noted.  DIAGNOSTIC FINDINGS:  See imaging   PATIENT SURVEYS:  Quick Dash:  QUICK DASH  Please rate your ability do the following activities in the last week by selecting the number below the appropriate response.   Activities Rating  Open a tight or new jar.  2 = Mild difficulty  Do heavy household chores (e.g., wash walls, floors). 1 = No difficulty   Carry a shopping bag or briefcase 1 = No difficulty   Wash your back. 3 = Moderate difficulty  Use a knife to cut food. 2 = Mild difficulty  Recreational activities in which you take some force or impact through your arm, shoulder or hand (e.g., golf, hammering, tennis, etc.). 2 = Mild difficulty  During the past week, to what extent has your arm, shoulder or hand problem interfered with your normal social activities with family, friends, neighbors or groups?  4 = Quite a bit  During the past week, were you limited in your work or other regular daily activities as a result of your arm, shoulder or hand  problem? 4 = Very limited  Rate the severity of the following symptoms in the last week: Arm, Shoulder, or hand pain. 4 = Severe  Rate the severity of the following symptoms in the last week: Tingling (pins and needles) in your arm, shoulder or hand. 4 = Severe  During the past week, how much difficulty have you had sleeping because of the pain in your arm, shoulder or hand?  3 = Moderate difficulty   (A QuickDASH score may not be calculated if there is greater than 1 missing item.)  Quick Dash Disability/Symptom Score: 41%  Minimally Clinically Important Difference (MCID): 15-20 points  Flavio, F. et al. (2013). Minimally clinically important difference of the disabilities of the arm, shoulder, and hand outcome measures (DASH) and its shortened version (Quick DASH). Journal of Orthopaedic & Sports Physical Therapy, 44(1), 30-39)   COGNITION: Overall cognitive status: Within functional limits for tasks assessed     SENSATION: Light touch: Impaired - R UE   POSTURE: rounded shoulders and forward head  PALPATION: TTP to R anterior shoulder, R infraspinatus  UPPER EXTREMITY ROM:  Active ROM Right eval Left eval  Shoulder flexion 125 WNL  Shoulder extension    Shoulder abduction 80 WNL  Shoulder adduction    Shoulder extension    Shoulder internal rotation L2 T12  Shoulder external rotation 40 WNL  Elbow flexion    Elbow extension    Wrist flexion    Wrist extension    Wrist ulnar deviation    Wrist radial deviation    Wrist pronation    Wrist supination     (Blank rows = not tested)  UPPER EXTREMITY MMT:  MMT Right eval Left eval  Shoulder flexion 3+ 5  Shoulder extension    Shoulder abduction 3 5  Shoulder adduction    Shoulder extension    Shoulder internal rotation 3+ 5  Shoulder external rotation 3 p! 5  Middle trapezius    Lower trapezius    Elbow flexion    Elbow extension    Wrist flexion    Wrist extension    Wrist ulnar deviation    Wrist  radial  deviation    Wrist pronation    Wrist supination    Grip strength 65# 65#   (Blank rows = not tested)  TREATMENT: [facility] Adult PT Treatment:                                                DATE: 02/03/2024 UBE lvl 1.0 x 3 min for functional activity tolerance Supine dow flex 2x10 Supine horizontal abd 2x10 YTB R shoulder IR/ER x 10 - 5 hold Row 2x10 GTB UE ranger flexion x 10 41 Seated cane ER AAROM 2x10 Seated row 2x10 20#  [facility] Adult PT Treatment:                                                DATE: 01/30/2024 UBE lvl 1.0 x 3 min for functional activity tolerance Supine dow flex 2x10 Supine horizontal abd 2x10 YTB R shoulder IR/ER x 10 - 5 hold Row 2x10 GTB UE ranger flexion x 10 41 Seated cane ER AAROM 2x10 Seated row 2x10 20#  [facility] Adult PT Treatment:                                                DATE: 01/16/2024 Therapeutic Exercise: R upper trap stretch x 30  Row x 10 blue band Corner pec stretch x 30 R shoulder IR/ER x 5 - 5 hold  PATIENT EDUCATION:  Education details: eval findings, Quick DASH, HEP, POC Person educated: Patient Education method: Explanation, Demonstration, and Handouts Education comprehension: verbalized understanding and returned demonstration  HOME EXERCISE PROGRAM: Access Code: [email or web address] URL: [email or web address] Date: 01/15/2024 Prepared by: Alm Kingdom  Exercises - Seated Cervical Sidebending Stretch  - 1 x daily - 7 x weekly - 2-3 reps - 30 sec hold - Standing Shoulder Row with Anchored Resistance  - 1 x daily - 7 x weekly - 3 sets - 10 reps - blue band hold - Corner Pec Major Stretch  - 1 x daily - 7 x weekly - 2-3 reps - 30 sec hold - Standing Isometric Shoulder Internal Rotation with Towel Roll at Doorway  - 1 x daily - 7 x weekly - 2-3 sets - 10 reps - 5 sec hold - Standing Isometric Shoulder External Rotation with Doorway and Towel Roll (Mirrored)  - 1 x daily - 7 x weekly - 2-3 sets - 10 reps - 5 sec  hold  ASSESSMENT:  CLINICAL IMPRESSION: ***  EVAL: Patient is a 49 y.o. M who was seen today for physical therapy evaluation and treatment for chronic R shoulder pain. Physical findings are consistent with MD impression as pt demonstrates decrease in RTC and periscapular strength and postural deficits. Quick DASH score shows severe disability in performance of home ADLs and community activities. Pt would benefit from skilled PT services working on periscapular and RTC strength in order to decrease pain and improve shoulder function.   OBJECTIVE IMPAIRMENTS: decreased activity tolerance, decreased endurance, decreased mobility, decreased ROM, decreased strength, postural dysfunction, and pain.   ACTIVITY LIMITATIONS: carrying, lifting, dressing, reach over head, and locomotion level  PARTICIPATION LIMITATIONS: meal  prep, cleaning, shopping, community activity, occupation, yard work, and school  PERSONAL FACTORS: Time since onset of injury/illness/exacerbation and 1-2 comorbidities: HTN, DVT, Hx of cancer are also affecting patient's functional outcome.   REHAB POTENTIAL: Good  CLINICAL DECISION MAKING: Evolving/moderate complexity  EVALUATION COMPLEXITY: Moderate   GOALS: Goals reviewed with patient? No  SHORT TERM GOALS: Target date: 02/05/2024   Pt will be compliant and knowledgeable with initial HEP for improved comfort and carryover Baseline: initial HEP given  Goal status: INITIAL  2.  Pt will self report R shoulder pain no greater than 7/10 for improved comfort and functional ability Baseline: 10/10 at worst Goal status: INITIAL   LONG TERM GOALS: Target date: 03/12/2024   Pt will decrease Quick DASH disability score to no greater than 28% as proxy for functional improvement Baseline: 40%  Goal status: INITIAL  2.  Pt will self report R shoulder pain no greater than 5/10 for improved comfort and functional ability Baseline: 10/10 at worst Goal status: INITIAL   3.   Pt will improve R shoulder flexion to no less than 140 degrees for improved comfort and function Baseline: see chart Goal status: INITIAL  4.  Pt will improve R shoulder MMT to no less than 4/5 for all tested motions for improved comfort and function Baseline: see chart Goal status: INITIAL   PLAN:  PT FREQUENCY: 1-2x/week  PT DURATION: 8 weeks  PLANNED INTERVENTIONS: 97164- PT Re-evaluation, 97110-Therapeutic exercises, 97530- Therapeutic activity, 97112- Neuromuscular re-education, 97535- Self Care, 02859- Manual therapy, G0283- Electrical stimulation (unattended), [id number]- Electrical stimulation (manual), 97016- Vasopneumatic device, 20560 (1-2 muscles), 20561 (3+ muscles)- Dry Needling, and Patient/Family education  PLAN FOR NEXT SESSION: assess HEP response, RTC and periscapular strengthening, anterior stretching    Reyes CHRISTELLA Kohut, PT 03/15/2024, 11:22 AM  "

## 2024-03-16 ENCOUNTER — Ambulatory Visit

## 2024-03-20 ENCOUNTER — Ambulatory Visit
Admission: RE | Admit: 2024-03-20 | Discharge: 2024-03-20 | Disposition: A | Source: Ambulatory Visit | Attending: Internal Medicine | Admitting: Internal Medicine

## 2024-03-20 DIAGNOSIS — E042 Nontoxic multinodular goiter: Secondary | ICD-10-CM

## 2024-03-23 ENCOUNTER — Other Ambulatory Visit: Payer: Self-pay

## 2024-04-01 ENCOUNTER — Other Ambulatory Visit: Payer: Self-pay

## 2024-04-09 ENCOUNTER — Other Ambulatory Visit (HOSPITAL_BASED_OUTPATIENT_CLINIC_OR_DEPARTMENT_OTHER): Payer: Self-pay

## 2024-04-09 ENCOUNTER — Other Ambulatory Visit: Payer: Self-pay

## 2024-04-14 ENCOUNTER — Encounter: Payer: Self-pay | Admitting: Internal Medicine
